# Patient Record
Sex: Male | Born: 1935
Health system: Southern US, Community
[De-identification: ages and names within clinical notes are randomized; demographics above are authoritative.]

## PROBLEM LIST (undated history)

## (undated) DIAGNOSIS — N400 Enlarged prostate without lower urinary tract symptoms: Secondary | ICD-10-CM

## (undated) DIAGNOSIS — R32 Unspecified urinary incontinence: Secondary | ICD-10-CM

## (undated) DIAGNOSIS — I82409 Acute embolism and thrombosis of unspecified deep veins of unspecified lower extremity: Secondary | ICD-10-CM

## (undated) DIAGNOSIS — F419 Anxiety disorder, unspecified: Secondary | ICD-10-CM

## (undated) DIAGNOSIS — K810 Acute cholecystitis: Secondary | ICD-10-CM

## (undated) DIAGNOSIS — I10 Essential (primary) hypertension: Secondary | ICD-10-CM

## (undated) DIAGNOSIS — M1990 Unspecified osteoarthritis, unspecified site: Secondary | ICD-10-CM

## (undated) DIAGNOSIS — K5792 Diverticulitis of intestine, part unspecified, without perforation or abscess without bleeding: Secondary | ICD-10-CM

## (undated) DIAGNOSIS — E119 Type 2 diabetes mellitus without complications: Secondary | ICD-10-CM

## (undated) DIAGNOSIS — F0390 Unspecified dementia without behavioral disturbance: Secondary | ICD-10-CM

## (undated) DIAGNOSIS — E782 Mixed hyperlipidemia: Secondary | ICD-10-CM

## (undated) DIAGNOSIS — Z8701 Personal history of pneumonia (recurrent): Secondary | ICD-10-CM

## (undated) DIAGNOSIS — K219 Gastro-esophageal reflux disease without esophagitis: Secondary | ICD-10-CM

## (undated) DIAGNOSIS — I2699 Other pulmonary embolism without acute cor pulmonale: Secondary | ICD-10-CM

## (undated) HISTORY — PX: SPINE SURGERY: SHX786

## (undated) HISTORY — DX: Essential (primary) hypertension: I10

## (undated) HISTORY — DX: Unspecified osteoarthritis, unspecified site: M19.90

## (undated) HISTORY — DX: Acute embolism and thrombosis of unspecified deep veins of unspecified lower extremity: I82.409

## (undated) HISTORY — DX: Other pulmonary embolism without acute cor pulmonale: I26.99

## (undated) HISTORY — PX: EYE SURGERY: SHX253

## (undated) HISTORY — PX: UMBILICAL HERNIA REPAIR: SHX196

## (undated) HISTORY — DX: Personal history of pneumonia (recurrent): Z87.01

## (undated) HISTORY — DX: Benign prostatic hyperplasia without lower urinary tract symptoms: N40.0

## (undated) HISTORY — DX: Unspecified dementia, unspecified severity, without behavioral disturbance, psychotic disturbance, mood disturbance, and anxiety: F03.90

## (undated) HISTORY — DX: Type 2 diabetes mellitus without complications: E11.9

## (undated) HISTORY — DX: Acute cholecystitis: K81.0

## (undated) HISTORY — PX: CARPAL TUNNEL RELEASE: SHX101

## (undated) HISTORY — DX: Mixed hyperlipidemia: E78.2

---

## 1998-10-16 ENCOUNTER — Encounter: Payer: Self-pay | Admitting: Neurosurgery

## 1998-10-21 ENCOUNTER — Inpatient Hospital Stay (HOSPITAL_COMMUNITY): Admission: RE | Admit: 1998-10-21 | Discharge: 1998-10-22 | Payer: Self-pay | Admitting: Neurosurgery

## 1998-10-21 ENCOUNTER — Encounter: Payer: Self-pay | Admitting: Neurosurgery

## 1998-12-17 ENCOUNTER — Ambulatory Visit (HOSPITAL_COMMUNITY): Admission: RE | Admit: 1998-12-17 | Discharge: 1998-12-17 | Payer: Self-pay | Admitting: Neurosurgery

## 1998-12-17 ENCOUNTER — Encounter: Payer: Self-pay | Admitting: Neurosurgery

## 2001-09-02 ENCOUNTER — Encounter: Payer: Self-pay | Admitting: *Deleted

## 2001-09-02 ENCOUNTER — Emergency Department (HOSPITAL_COMMUNITY): Admission: EM | Admit: 2001-09-02 | Discharge: 2001-09-02 | Payer: Self-pay | Admitting: *Deleted

## 2002-03-19 ENCOUNTER — Encounter: Payer: Self-pay | Admitting: Emergency Medicine

## 2002-03-19 ENCOUNTER — Emergency Department (HOSPITAL_COMMUNITY): Admission: EM | Admit: 2002-03-19 | Discharge: 2002-03-19 | Payer: Self-pay | Admitting: Emergency Medicine

## 2003-08-12 ENCOUNTER — Encounter: Payer: Self-pay | Admitting: Family Medicine

## 2003-08-12 ENCOUNTER — Ambulatory Visit (HOSPITAL_COMMUNITY): Admission: RE | Admit: 2003-08-12 | Discharge: 2003-08-12 | Payer: Self-pay | Admitting: Family Medicine

## 2003-11-15 ENCOUNTER — Emergency Department (HOSPITAL_COMMUNITY): Admission: EM | Admit: 2003-11-15 | Discharge: 2003-11-15 | Payer: Self-pay | Admitting: Emergency Medicine

## 2003-12-24 ENCOUNTER — Other Ambulatory Visit: Admission: RE | Admit: 2003-12-24 | Discharge: 2003-12-24 | Payer: Self-pay | Admitting: Urology

## 2004-01-24 ENCOUNTER — Ambulatory Visit (HOSPITAL_COMMUNITY): Admission: RE | Admit: 2004-01-24 | Discharge: 2004-01-24 | Payer: Self-pay | Admitting: Family Medicine

## 2004-01-29 ENCOUNTER — Encounter (HOSPITAL_COMMUNITY): Admission: RE | Admit: 2004-01-29 | Discharge: 2004-01-30 | Payer: Self-pay | Admitting: Internal Medicine

## 2004-02-07 ENCOUNTER — Ambulatory Visit (HOSPITAL_COMMUNITY): Admission: RE | Admit: 2004-02-07 | Discharge: 2004-02-07 | Payer: Self-pay | Admitting: Family Medicine

## 2004-02-17 ENCOUNTER — Ambulatory Visit (HOSPITAL_COMMUNITY): Admission: RE | Admit: 2004-02-17 | Discharge: 2004-02-17 | Payer: Self-pay | Admitting: Family Medicine

## 2004-11-20 ENCOUNTER — Ambulatory Visit: Payer: Self-pay | Admitting: Family Medicine

## 2005-01-21 ENCOUNTER — Ambulatory Visit: Payer: Self-pay | Admitting: Family Medicine

## 2005-03-31 ENCOUNTER — Ambulatory Visit: Payer: Self-pay | Admitting: Family Medicine

## 2005-08-17 ENCOUNTER — Ambulatory Visit: Payer: Self-pay | Admitting: Family Medicine

## 2005-10-07 ENCOUNTER — Ambulatory Visit: Payer: Self-pay | Admitting: Family Medicine

## 2005-10-25 ENCOUNTER — Ambulatory Visit: Payer: Self-pay | Admitting: Family Medicine

## 2005-10-25 ENCOUNTER — Ambulatory Visit (HOSPITAL_COMMUNITY): Admission: RE | Admit: 2005-10-25 | Discharge: 2005-10-25 | Payer: Self-pay | Admitting: Family Medicine

## 2006-02-01 ENCOUNTER — Ambulatory Visit: Payer: Self-pay | Admitting: Family Medicine

## 2006-05-04 ENCOUNTER — Ambulatory Visit: Payer: Self-pay | Admitting: Family Medicine

## 2006-08-03 ENCOUNTER — Ambulatory Visit: Payer: Self-pay | Admitting: Family Medicine

## 2006-09-05 ENCOUNTER — Ambulatory Visit: Payer: Self-pay | Admitting: Family Medicine

## 2006-11-28 ENCOUNTER — Ambulatory Visit: Payer: Self-pay | Admitting: Family Medicine

## 2007-03-02 ENCOUNTER — Encounter: Payer: Self-pay | Admitting: Family Medicine

## 2007-03-02 LAB — CONVERTED CEMR LAB
AST: 12 units/L (ref 0–37)
Alkaline Phosphatase: 58 units/L (ref 39–117)
BUN: 12 mg/dL (ref 6–23)
Bilirubin, Direct: 0.2 mg/dL (ref 0.0–0.3)
CO2: 25 meq/L (ref 19–32)
Calcium: 8.7 mg/dL (ref 8.4–10.5)
Chloride: 107 meq/L (ref 96–112)
Creatinine, Ser: 1.04 mg/dL (ref 0.40–1.50)
HDL: 43 mg/dL (ref >39)
Indirect Bilirubin: 0.7 mg/dL (ref 0.0–0.9)
LDL Cholesterol: 74 mg/dL (ref 0–99)
Total Bilirubin: 0.9 mg/dL (ref 0.3–1.2)

## 2007-03-08 ENCOUNTER — Ambulatory Visit: Payer: Self-pay | Admitting: Family Medicine

## 2007-06-29 ENCOUNTER — Ambulatory Visit: Payer: Self-pay | Admitting: Family Medicine

## 2007-07-17 ENCOUNTER — Ambulatory Visit: Payer: Self-pay | Admitting: Family Medicine

## 2007-09-07 ENCOUNTER — Ambulatory Visit: Payer: Self-pay | Admitting: Family Medicine

## 2007-11-09 ENCOUNTER — Ambulatory Visit: Payer: Self-pay | Admitting: Family Medicine

## 2007-11-09 LAB — CONVERTED CEMR LAB
Alkaline Phosphatase: 66 units/L (ref 39–117)
BUN: 8 mg/dL (ref 6–23)
CO2: 26 meq/L (ref 19–32)
Chloride: 108 meq/L (ref 96–112)
Creatinine, Ser: 0.97 mg/dL (ref 0.40–1.50)
Glucose, Bld: 101 mg/dL — ABNORMAL HIGH (ref 70–99)
Indirect Bilirubin: 0.6 mg/dL (ref 0.0–0.9)
LDL Cholesterol: 64 mg/dL (ref 0–99)
Potassium: 4.7 meq/L (ref 3.5–5.3)
Total Bilirubin: 0.8 mg/dL (ref 0.3–1.2)
Total Protein: 6.7 g/dL (ref 6.0–8.3)
Triglycerides: 94 mg/dL (ref <150)
VLDL: 19 mg/dL (ref 0–40)

## 2007-12-18 ENCOUNTER — Ambulatory Visit: Payer: Self-pay | Admitting: Family Medicine

## 2008-03-19 ENCOUNTER — Ambulatory Visit: Payer: Self-pay | Admitting: Family Medicine

## 2008-03-28 DIAGNOSIS — N400 Enlarged prostate without lower urinary tract symptoms: Secondary | ICD-10-CM | POA: Insufficient documentation

## 2008-03-28 DIAGNOSIS — M129 Arthropathy, unspecified: Secondary | ICD-10-CM | POA: Insufficient documentation

## 2008-04-09 ENCOUNTER — Ambulatory Visit (HOSPITAL_COMMUNITY): Admission: RE | Admit: 2008-04-09 | Discharge: 2008-04-09 | Payer: Self-pay | Admitting: Family Medicine

## 2008-05-07 ENCOUNTER — Ambulatory Visit: Payer: Self-pay | Admitting: Family Medicine

## 2008-05-21 ENCOUNTER — Encounter: Payer: Self-pay | Admitting: Family Medicine

## 2008-05-21 LAB — CONVERTED CEMR LAB
AST: 10 units/L (ref 0–37)
Albumin: 3.9 g/dL (ref 3.5–5.2)
Alkaline Phosphatase: 45 units/L (ref 39–117)
HDL: 52 mg/dL (ref >39)
LDL Cholesterol: 60 mg/dL (ref 0–99)
Total CHOL/HDL Ratio: 2.5
Total Protein: 6.8 g/dL (ref 6.0–8.3)
Triglycerides: 94 mg/dL (ref <150)

## 2008-07-02 ENCOUNTER — Telehealth (INDEPENDENT_AMBULATORY_CARE_PROVIDER_SITE_OTHER): Payer: Self-pay | Admitting: *Deleted

## 2008-07-18 ENCOUNTER — Ambulatory Visit: Payer: Self-pay | Admitting: Family Medicine

## 2008-07-18 LAB — CONVERTED CEMR LAB: Blood Glucose, Fasting: 147 mg/dL

## 2008-07-19 ENCOUNTER — Encounter: Payer: Self-pay | Admitting: Family Medicine

## 2008-07-19 DIAGNOSIS — E785 Hyperlipidemia, unspecified: Secondary | ICD-10-CM | POA: Insufficient documentation

## 2008-07-19 LAB — CONVERTED CEMR LAB: Microalb, Ur: 0.2 mg/dL (ref 0.00–1.89)

## 2008-08-28 ENCOUNTER — Telehealth: Payer: Self-pay | Admitting: Family Medicine

## 2008-09-02 ENCOUNTER — Ambulatory Visit: Payer: Self-pay | Admitting: Family Medicine

## 2008-09-02 LAB — CONVERTED CEMR LAB
ALT: 9 units/L (ref 0–53)
AST: 12 units/L (ref 0–37)
Alkaline Phosphatase: 48 units/L (ref 39–117)
BUN: 14 mg/dL (ref 6–23)
Basophils Absolute: 0 10*3/uL (ref 0.0–0.1)
Basophils Relative: 1 % (ref 0–1)
Calcium: 9.4 mg/dL (ref 8.4–10.5)
Cholesterol: 143 mg/dL (ref 0–200)
Creatinine, Ser: 1.11 mg/dL (ref 0.40–1.50)
Eosinophils Absolute: 0.1 10*3/uL (ref 0.0–0.7)
Hemoglobin: 14.1 g/dL (ref 13.0–17.0)
Indirect Bilirubin: 1.4 mg/dL — ABNORMAL HIGH (ref 0.0–0.9)
MCHC: 33.7 g/dL (ref 30.0–36.0)
MCV: 92.5 fL (ref 78.0–100.0)
Monocytes Absolute: 0.5 10*3/uL (ref 0.1–1.0)
Monocytes Relative: 11 % (ref 3–12)
RBC: 4.52 M/uL (ref 4.22–5.81)
RDW: 13.3 % (ref 11.5–15.5)
Total Protein: 7.3 g/dL (ref 6.0–8.3)
Triglycerides: 89 mg/dL (ref <150)

## 2008-09-04 ENCOUNTER — Ambulatory Visit: Payer: Self-pay | Admitting: Family Medicine

## 2008-09-05 DIAGNOSIS — E1121 Type 2 diabetes mellitus with diabetic nephropathy: Secondary | ICD-10-CM

## 2008-09-05 DIAGNOSIS — Z794 Long term (current) use of insulin: Secondary | ICD-10-CM

## 2008-09-05 DIAGNOSIS — E1165 Type 2 diabetes mellitus with hyperglycemia: Secondary | ICD-10-CM

## 2008-09-18 ENCOUNTER — Telehealth: Payer: Self-pay | Admitting: Family Medicine

## 2008-10-31 ENCOUNTER — Encounter: Payer: Self-pay | Admitting: Family Medicine

## 2008-12-10 ENCOUNTER — Ambulatory Visit: Payer: Self-pay | Admitting: Family Medicine

## 2008-12-10 LAB — CONVERTED CEMR LAB: Glucose, Bld: 157 mg/dL

## 2008-12-11 DIAGNOSIS — I10 Essential (primary) hypertension: Secondary | ICD-10-CM

## 2009-01-13 ENCOUNTER — Encounter: Payer: Self-pay | Admitting: Family Medicine

## 2009-03-20 ENCOUNTER — Ambulatory Visit: Payer: Self-pay | Admitting: Family Medicine

## 2009-03-20 ENCOUNTER — Telehealth: Payer: Self-pay | Admitting: Family Medicine

## 2009-03-21 ENCOUNTER — Telehealth: Payer: Self-pay | Admitting: Family Medicine

## 2009-03-24 ENCOUNTER — Encounter: Payer: Self-pay | Admitting: Family Medicine

## 2009-03-24 LAB — CONVERTED CEMR LAB
ALT: 9 units/L (ref 0–53)
Bilirubin, Direct: 0.3 mg/dL (ref 0.0–0.3)
Chloride: 104 meq/L (ref 96–112)
Cholesterol: 124 mg/dL (ref 0–200)
Glucose, Bld: 155 mg/dL — ABNORMAL HIGH (ref 70–99)
PSA: 8.67 ng/mL — ABNORMAL HIGH (ref 0.10–4.00)
Potassium: 4.3 meq/L (ref 3.5–5.3)
Sodium: 141 meq/L (ref 135–145)
TSH: 0.513 microintl units/mL (ref 0.350–4.500)
Total CHOL/HDL Ratio: 2.8
Total Protein: 6.7 g/dL (ref 6.0–8.3)
VLDL: 20 mg/dL (ref 0–40)

## 2009-03-27 ENCOUNTER — Telehealth: Payer: Self-pay | Admitting: Family Medicine

## 2009-04-29 ENCOUNTER — Encounter: Payer: Self-pay | Admitting: Family Medicine

## 2009-05-12 ENCOUNTER — Encounter: Payer: Self-pay | Admitting: Family Medicine

## 2009-05-23 ENCOUNTER — Encounter: Payer: Self-pay | Admitting: Family Medicine

## 2009-06-19 ENCOUNTER — Ambulatory Visit: Payer: Self-pay | Admitting: Family Medicine

## 2009-06-19 DIAGNOSIS — E049 Nontoxic goiter, unspecified: Secondary | ICD-10-CM

## 2009-07-24 ENCOUNTER — Telehealth: Payer: Self-pay | Admitting: Family Medicine

## 2009-07-25 ENCOUNTER — Encounter: Payer: Self-pay | Admitting: Family Medicine

## 2009-07-31 ENCOUNTER — Encounter: Payer: Self-pay | Admitting: Family Medicine

## 2009-07-31 LAB — CONVERTED CEMR LAB
AST: 14 units/L (ref 0–37)
Alkaline Phosphatase: 44 units/L (ref 39–117)
Basophils Relative: 0 % (ref 0–1)
Bilirubin, Direct: 0.2 mg/dL (ref 0.0–0.3)
CO2: 26 meq/L (ref 19–32)
Calcium: 8.8 mg/dL (ref 8.4–10.5)
Creatinine, Ser: 1.26 mg/dL (ref 0.40–1.50)
Eosinophils Absolute: 0.1 10*3/uL (ref 0.0–0.7)
Eosinophils Relative: 3 % (ref 0–5)
Glucose, Bld: 99 mg/dL (ref 70–99)
Hemoglobin: 13.3 g/dL (ref 13.0–17.0)
MCHC: 32.6 g/dL (ref 30.0–36.0)
MCV: 93.6 fL (ref 78.0–100.0)
Microalb, Ur: 0.5 mg/dL (ref 0.00–1.89)
Monocytes Absolute: 0.5 10*3/uL (ref 0.1–1.0)
Monocytes Relative: 12 % (ref 3–12)
RBC: 4.36 M/uL (ref 4.22–5.81)
Total Bilirubin: 1 mg/dL (ref 0.3–1.2)

## 2009-09-04 ENCOUNTER — Ambulatory Visit: Payer: Self-pay | Admitting: Family Medicine

## 2009-09-04 LAB — CONVERTED CEMR LAB: Hgb A1c MFr Bld: 7.3 %

## 2009-09-16 ENCOUNTER — Telehealth: Payer: Self-pay | Admitting: Family Medicine

## 2009-11-27 ENCOUNTER — Encounter: Payer: Self-pay | Admitting: Family Medicine

## 2009-12-29 ENCOUNTER — Telehealth: Payer: Self-pay | Admitting: Physician Assistant

## 2010-01-08 ENCOUNTER — Ambulatory Visit: Payer: Self-pay | Admitting: Family Medicine

## 2010-01-08 LAB — CONVERTED CEMR LAB
Blood Glucose, Fasting: 276 mg/dL
Hgb A1c MFr Bld: 7.4 %

## 2010-01-12 ENCOUNTER — Telehealth: Payer: Self-pay | Admitting: Family Medicine

## 2010-01-13 LAB — CONVERTED CEMR LAB
ALT: 8 units/L (ref 0–53)
AST: 15 units/L (ref 0–37)
Albumin: 4.1 g/dL (ref 3.5–5.2)
CO2: 27 meq/L (ref 19–32)
Calcium: 9.5 mg/dL (ref 8.4–10.5)
Chloride: 101 meq/L (ref 96–112)
HDL: 44 mg/dL (ref >39)
Sodium: 140 meq/L (ref 135–145)
Total Bilirubin: 1 mg/dL (ref 0.3–1.2)
Total CHOL/HDL Ratio: 2.9
Total Protein: 7.1 g/dL (ref 6.0–8.3)
Triglycerides: 72 mg/dL (ref <150)

## 2010-02-09 ENCOUNTER — Telehealth: Payer: Self-pay | Admitting: Family Medicine

## 2010-02-09 ENCOUNTER — Ambulatory Visit: Payer: Self-pay | Admitting: Family Medicine

## 2010-02-20 ENCOUNTER — Telehealth: Payer: Self-pay | Admitting: Family Medicine

## 2010-03-05 ENCOUNTER — Telehealth: Payer: Self-pay | Admitting: Family Medicine

## 2010-04-14 ENCOUNTER — Telehealth: Payer: Self-pay | Admitting: Family Medicine

## 2010-05-22 ENCOUNTER — Telehealth: Payer: Self-pay | Admitting: Family Medicine

## 2010-06-05 ENCOUNTER — Ambulatory Visit: Payer: Self-pay | Admitting: Family Medicine

## 2010-06-07 DIAGNOSIS — L0390 Cellulitis, unspecified: Secondary | ICD-10-CM

## 2010-06-07 DIAGNOSIS — L0291 Cutaneous abscess, unspecified: Secondary | ICD-10-CM | POA: Insufficient documentation

## 2010-06-07 LAB — CONVERTED CEMR LAB: Hgb A1c MFr Bld: 7.4 % — ABNORMAL HIGH (ref <5.7)

## 2010-06-15 ENCOUNTER — Telehealth: Payer: Self-pay | Admitting: Family Medicine

## 2010-06-16 ENCOUNTER — Encounter: Payer: Self-pay | Admitting: Family Medicine

## 2010-07-21 ENCOUNTER — Telehealth: Payer: Self-pay | Admitting: Family Medicine

## 2010-07-27 ENCOUNTER — Ambulatory Visit: Payer: Self-pay | Admitting: Family Medicine

## 2010-07-27 ENCOUNTER — Ambulatory Visit (HOSPITAL_COMMUNITY): Admission: RE | Admit: 2010-07-27 | Discharge: 2010-07-27 | Payer: Self-pay | Admitting: Family Medicine

## 2010-07-27 DIAGNOSIS — R609 Edema, unspecified: Secondary | ICD-10-CM

## 2010-07-28 ENCOUNTER — Encounter: Payer: Self-pay | Admitting: Physician Assistant

## 2010-07-28 LAB — CONVERTED CEMR LAB
Hemoglobin: 12.8 g/dL — ABNORMAL LOW (ref 13.0–17.0)
MCHC: 32.9 g/dL (ref 30.0–36.0)
Platelets: 222 10*3/uL (ref 150–400)
RDW: 13.7 % (ref 11.5–15.5)
Uric Acid, Serum: 7.3 mg/dL (ref 4.0–7.8)

## 2010-07-30 LAB — CONVERTED CEMR LAB
Ferritin: 84 ng/mL (ref 22–322)
Iron: 95 ug/dL (ref 42–165)

## 2010-08-04 ENCOUNTER — Ambulatory Visit: Payer: Self-pay | Admitting: Family Medicine

## 2010-08-07 ENCOUNTER — Encounter: Payer: Self-pay | Admitting: Physician Assistant

## 2010-08-12 ENCOUNTER — Telehealth: Payer: Self-pay | Admitting: Family Medicine

## 2010-08-13 ENCOUNTER — Encounter: Payer: Self-pay | Admitting: Family Medicine

## 2010-08-14 ENCOUNTER — Ambulatory Visit (HOSPITAL_COMMUNITY): Admission: RE | Admit: 2010-08-14 | Discharge: 2010-08-14 | Payer: Self-pay | Admitting: Family Medicine

## 2010-09-08 ENCOUNTER — Ambulatory Visit: Payer: Self-pay | Admitting: Family Medicine

## 2010-09-09 LAB — CONVERTED CEMR LAB
BUN: 14 mg/dL (ref 6–23)
Calcium: 9.4 mg/dL (ref 8.4–10.5)
Creatinine, Ser: 1.17 mg/dL (ref 0.40–1.50)
Hgb A1c MFr Bld: 7.1 % — ABNORMAL HIGH (ref <5.7)

## 2010-09-12 DIAGNOSIS — F0280 Dementia in other diseases classified elsewhere without behavioral disturbance: Secondary | ICD-10-CM | POA: Insufficient documentation

## 2010-09-12 DIAGNOSIS — G309 Alzheimer's disease, unspecified: Secondary | ICD-10-CM

## 2010-09-22 ENCOUNTER — Telehealth: Payer: Self-pay | Admitting: Family Medicine

## 2010-09-24 ENCOUNTER — Telehealth: Payer: Self-pay | Admitting: Family Medicine

## 2010-10-05 ENCOUNTER — Telehealth: Payer: Self-pay | Admitting: Family Medicine

## 2010-10-09 ENCOUNTER — Telehealth (INDEPENDENT_AMBULATORY_CARE_PROVIDER_SITE_OTHER): Payer: Self-pay | Admitting: *Deleted

## 2010-10-12 ENCOUNTER — Encounter: Payer: Self-pay | Admitting: Family Medicine

## 2010-11-17 ENCOUNTER — Telehealth: Payer: Self-pay | Admitting: Family Medicine

## 2010-12-02 ENCOUNTER — Encounter: Payer: Self-pay | Admitting: Family Medicine

## 2010-12-10 ENCOUNTER — Encounter: Payer: Self-pay | Admitting: Family Medicine

## 2010-12-11 ENCOUNTER — Encounter: Payer: Self-pay | Admitting: Family Medicine

## 2010-12-11 ENCOUNTER — Ambulatory Visit
Admission: RE | Admit: 2010-12-11 | Discharge: 2010-12-11 | Payer: Self-pay | Source: Home / Self Care | Attending: Family Medicine | Admitting: Family Medicine

## 2010-12-14 ENCOUNTER — Encounter: Payer: Self-pay | Admitting: Family Medicine

## 2010-12-14 ENCOUNTER — Ambulatory Visit
Admission: RE | Admit: 2010-12-14 | Discharge: 2010-12-14 | Payer: Self-pay | Source: Home / Self Care | Attending: Family Medicine | Admitting: Family Medicine

## 2010-12-14 LAB — CONVERTED CEMR LAB
AST: 13 units/L (ref 0–37)
Albumin: 4.1 g/dL (ref 3.5–5.2)
Alkaline Phosphatase: 54 units/L (ref 39–117)
Basophils Absolute: 0 10*3/uL (ref 0.0–0.1)
Basophils Relative: 0 % (ref 0–1)
Eosinophils Absolute: 0.2 10*3/uL (ref 0.0–0.7)
HDL: 42 mg/dL (ref >39)
LDL Cholesterol: 76 mg/dL (ref 0–99)
MCHC: 33.7 g/dL (ref 30.0–36.0)
MCV: 89.4 fL (ref 78.0–100.0)
Monocytes Absolute: 0.5 10*3/uL (ref 0.1–1.0)
Monocytes Relative: 10 % (ref 3–12)
Neutro Abs: 2.2 10*3/uL (ref 1.7–7.7)
Neutrophils Relative %: 49 % (ref 43–77)
Potassium: 4.2 meq/L (ref 3.5–5.3)
RBC: 4.35 M/uL (ref 4.22–5.81)
RDW: 13.8 % (ref 11.5–15.5)
Sodium: 140 meq/L (ref 135–145)
TSH: 0.632 microintl units/mL (ref 0.350–4.500)
Total Bilirubin: 1 mg/dL (ref 0.3–1.2)
Total Protein: 7.6 g/dL (ref 6.0–8.3)
Triglycerides: 58 mg/dL (ref <150)

## 2010-12-16 ENCOUNTER — Telehealth: Payer: Self-pay | Admitting: Family Medicine

## 2010-12-16 ENCOUNTER — Encounter: Payer: Self-pay | Admitting: Family Medicine

## 2010-12-19 ENCOUNTER — Encounter: Payer: Self-pay | Admitting: Family Medicine

## 2010-12-20 ENCOUNTER — Encounter: Payer: Self-pay | Admitting: Family Medicine

## 2010-12-22 ENCOUNTER — Encounter: Payer: Self-pay | Admitting: Family Medicine

## 2010-12-29 ENCOUNTER — Encounter: Payer: Self-pay | Admitting: Family Medicine

## 2010-12-29 NOTE — Assessment & Plan Note (Signed)
Summary: follow up   Vital Signs:  Patient profile:   77 year old male Height:      70 inches Weight:      248.25 pounds O2 Sat:      94 % on Room air Pulse rate:   83 / minute Pulse rhythm:   regular Resp:     16 per minute BP sitting:   120 / 80  (left arm)  Vitals Entered By: Adella Hare LPN (June 05, 1190 9:47 AM)  O2 Flow:  Room air CC: follow-up visit Is Patient Diabetic? Yes Did you bring your meter with you today? No Pain Assessment Patient in pain? no      Comments still has rash on legs   Primary Care Provider:  Syliva Overman MD  CC:  follow-up visit.  History of Present Illness: Reports  that he has been doing fairly well. he does however have valid concerns about a rash on his lower ext which has progressed in the past 2 onths, it is puritic, there has been no change in tolietries or foods or meds. Denies recent fever or chills. Denies sinus pressure, nasal congestion , ear pain or sore throat. Denies chest congestion, or cough productive of sputum. Denies chest pain, palpitations, PND, orthopnea or leg swelling. Denies abdominal pain, nausea, vomitting, diarrhea or constipation. Denies change in bowel movements or bloody stool. Denies dysuria , frequency, incontinence or hesitancy. He c/o increased right knee pain and stiffness for the the past 1 month, there has been no aggravating trauma. Denies headaches, vertigo, seizures. Denies depression, anxiety or insomnia.    Current Medications (verified): 1)  Metformin Hcl 500 Mg  Tabs (Metformin Hcl) .... Take Two Tablets By Mouth Twice Daily 2)  Aspirin 81 Mg  Tabs (Aspirin) .... Take 1 Tablet By Mouth Once A Day 3)  Simvastatin 40 Mg  Tabs (Simvastatin) .... Take 1 Tab By Mouth At Bedtime 4)  Norvasc 10 Mg  Tabs (Amlodipine Besylate) .... Take 1 Tablet By Mouth Once A Day 5)  Hydrochlorothiazide 12.5 Mg  Tabs (Hydrochlorothiazide) .... Take 1 Tablet By Mouth Once A Day 6)  Aleve 220 Mg Caps  (Naproxen Sodium) .... Take One Tab Every Six Hours As Needed 7)  Lancets  Misc (Lancets) .... Once Daily Testing 8)  Glipizide 10 Mg Tabs (Glipizide) .... One Tab By Mouth Bid 9)  Prodigy Autocode Blood Glucose W/device Kit (Blood Glucose Monitoring Suppl) .... Uad Once Daily Testing 10)  Prodigy Blood Glucose Test  Strp (Glucose Blood) .... Uad Once Daily Testing 11)  Prodigy Twist Top Lancets 28g  Misc (Lancets) .... Uad Once Daily Testing 12)  Onglyza 5 Mg Tabs (Saxagliptin Hcl) .... One Tab By Mouth Qd 13)  Doxycycline Hyclate 100 Mg Caps (Doxycycline Hyclate) .... Take 1 Capsule By Mouth Two Times A Day 14)  Nystatin-Triamcinolone 100000-0.1 Unit/gm-% Crea (Nystatin-Triamcinolone) .... Apply To Rash Bid  Allergies (verified): No Known Drug Allergies  Review of Systems      See HPI Eyes:  Complains of vision loss-both eyes; denies discharge, eye pain, and red eye. MS:  Complains of joint pain and stiffness; right knee pain with instability worse in the past 1 month. Derm:  Complains of itching, lesion(s), and rash; 2 month h/o red rash to both lower legs also pustules on right sole on right sole. Endo:  Denies excessive thirst and excessive urination; when checked, blood sugars are seldom over 130. Heme:  Denies abnormal bruising and bleeding. Allergy:  Complains of seasonal allergies.  Physical Exam  General:  Well-developed,obese,in no acute distress; alert,appropriate and cooperative throughout examination HEENT: No facial asymmetry,  EOMI, No sinus tenderness, TM's Clear, oropharynx  pink and moist.   Chest: Clear to auscultation bilaterally.  CVS: S1, S2, No murmurs, No S3.   Abd: Soft, Nontender.  MS: decreased  ROM spine, hips, shoulders and most markedly right  knee.  Ext: No edema.   CNS: CN 2-12 intact, power tone and sensation normal throughout.   Skin: I erythematous macular rash on lower ext bilaterally, primarilly the distal third. scabbed areas on sole of right  grt toe, where pt reports there had been pus draining Psych: Good eye contact, normal affect.  Memory intact, not anxious or depressed appearing.   Diabetes Management Exam:    Foot Exam (with socks and/or shoes not present):       Sensory-Monofilament:          Left foot: diminished          Right foot: diminished       Inspection:          Left foot: abnormal             Comments: pustule on right great toe          Right foot: normal       Nails:          Left foot: normal          Right foot: normal   Impression & Recommendations:  Problem # 1:  DERMATITIS (ICD-692.9) Assessment Deteriorated  His updated medication list for this problem includes:    Prednisone (pak) 5 Mg Tabs (Prednisone) ..... Use as directed    Betamethasone Dipropionate 0.05 % Crea (Betamethasone dipropionate) .[name]... Apply twice daily to affectedareas for 2 weeks then as needed  Problem # 2:  KNEE PAIN, BILATERAL (ICD-719.46) Assessment: Deteriorated  His updated medication list for this problem includes:    Aspirin 81 Mg Tabs (Aspirin) .[name]... Take 1 tablet by mouth once a day    Aleve 220 Mg Caps (Naproxen sodium) .[name]... Take one tab every six hours as needed  Orders: Depo- Medrol 80mg  (J1040) Ketorolac-Toradol 15mg  (Z6109) Admin of Therapeutic Inj  intramuscular or subcutaneous (60454)  Problem # 3:  HYPERTENSION (ICD-401.9) Assessment: Improved  His updated medication list for this problem includes:    Norvasc 10 Mg Tabs (Amlodipine besylate) .[name]... Take 1 tablet by mouth once a day    Hydrochlorothiazide 12.5 Mg Tabs (Hydrochlorothiazide) .[name]... Take 1 tablet by mouth once a day  BP today: 120/80 Prior BP: 130/80 (02/09/2010)  Labs Reviewed: K+: 4.5 (01/13/2010) Creat: : 1.20 (01/13/2010)   Chol: 128 (01/13/2010)   HDL: 44 (01/13/2010)   LDL: 70 (01/13/2010)   TG: 72 (01/13/2010)  Problem # 4:  DM (ICD-250.00) Assessment: Comment Only  His updated medication list for this problem  includes:    Metformin Hcl 500 Mg Tabs (Metformin hcl) .[name]... Take two tablets by mouth twice daily    Aspirin 81 Mg Tabs (Aspirin) .[name]... Take 1 tablet by mouth once a day    Glipizide 10 Mg Tabs (Glipizide) ..... One tab by mouth bid    Onglyza 5 Mg Tabs (Saxagliptin hcl) ..... One tab by mouth qd  Labs Reviewed: Creat: 1.20 (01/13/2010)    Reviewed HgBA1c results: 7.4 (01/08/2010)  7.3 (09/04/2009)  Problem # 5:  OBESITY (ICD-278.00) Assessment: Improved  Ht: 70 (06/05/2010)   Wt: 248.25 (06/05/2010)  BMI: 35.82 (02/09/2010)  Problem # 6:  CELLULITIS AND ABSCESS OF UNSPECIFIED SITE (ICD-682.9) Assessment: Deteriorated  The following medications were removed from the medication list:    Doxycycline Hyclate 100 Mg Caps (Doxycycline hyclate) .[name]... Take 1 capsule by mouth two times a day His updated medication list for this problem includes:    Septra Ds 800-160 Mg Tabs (Sulfamethoxazole-trimethoprim) .[name]... Take 1 tablet by mouth two times a day  Complete Medication List: 1)  Metformin Hcl 500 Mg Tabs (Metformin hcl) .... Take two tablets by mouth twice daily 2)  Aspirin 81 Mg Tabs (Aspirin) .... Take 1 tablet by mouth once a day 3)  Simvastatin 40 Mg Tabs (Simvastatin) .... Take 1 tab by mouth at bedtime 4)  Norvasc 10 Mg Tabs (Amlodipine besylate) .... Take 1 tablet by mouth once a day 5)  Hydrochlorothiazide 12.5 Mg Tabs (Hydrochlorothiazide) .... Take 1 tablet by mouth once a day 6)  Aleve 220 Mg Caps (Naproxen sodium) .... Take one tab every six hours as needed 7)  Lancets Misc (Lancets) .... Once daily testing 8)  Glipizide 10 Mg Tabs (Glipizide) .... One tab by mouth bid 9)  Prodigy Autocode Blood Glucose W/device Kit (Blood glucose monitoring suppl) .... Uad once daily testing 10)  Prodigy Blood Glucose Test Strp (Glucose blood) .... Uad once daily testing 11)  Prodigy Twist Top Lancets 28g Misc (Lancets) .... Uad once daily testing 12)  Onglyza 5 Mg Tabs (Saxagliptin hcl)  .... One tab by mouth qd 13)  Prednisone (pak) 5 Mg Tabs (Prednisone) .... Use as directed 14)  Betamethasone Dipropionate 0.05 % Crea (Betamethasone dipropionate) .... Apply twice daily to affectedareas for 2 weeks then as needed 15)  Septra Ds 800-160 Mg Tabs (Sulfamethoxazole-trimethoprim) .... Take 1 tablet by mouth two times a day  Patient Instructions: 1)  Please schedule a follow-up appointment in 3 months. 2)  you are getting injections today for your knee pain, also for the rash on your legs, if this does not get better in the next 10 to 14 dayspls call, because i will refer you to a skin specialist. 3)  Medication is also sent to you r pharmacy  4)  Your blood pressure and cholesterol are great, and meds are sent in to the pharmacy. Prescriptions: SEPTRA DS 800-160 MG TABS (SULFAMETHOXAZOLE-TRIMETHOPRIM) Take 1 tablet by mouth two times a day  #14 x 0   Entered and Authorized by:   Syliva Overman MD   Signed by:   Syliva Overman MD on 06/05/2010   Method used:   Electronically to        [facility]* (retail)       [address]       [place], [place]  [address]       Ph: [phone]       Fax: [phone]   RxID:   [id number] BETAMETHASONE DIPROPIONATE 0.05 % CREA (BETAMETHASONE DIPROPIONATE) apply twice daily to affectedareas for 2 weeks then as needed  #45 gm x 1   Entered and Authorized by:   Syliva Overman MD   Signed by:   Syliva Overman MD on 06/05/2010   Method used:   Electronically to        [facility]* (retail)       [address]       [place], [place]  [address]  Ph: [phone]       Fax: [phone]   RxID:   [id number] PREDNISONE (PAK) 5 MG TABS (PREDNISONE) Use as directed  #21 x 0   Entered and Authorized by:   Syliva Overman MD   Signed by:   Syliva Overman MD on 06/05/2010   Method used:   Electronically to        [facility]* (retail)       [address]       [place], [place]  [address]       Ph: [phone]       Fax: [phone]   RxID:   [id number]    Medication Administration  Injection # 1:    Medication: Depo- Medrol 80mg     Diagnosis: KNEE PAIN, BILATERAL (ICD-719.46)    Route: IM    Site: RUOQ gluteus    Exp Date: 4/12    Lot #: [id number]    Mfr: Pharmacia    Patient tolerated injection without complications    Given by: Adella Hare LPN (June 05, 3294 11:28 AM)  Injection # 2:    Medication: Ketorolac-Toradol 15mg     Diagnosis: KNEE PAIN, BILATERAL (ICD-719.46)    Route: IM    Site: RUOQ gluteus    Exp Date: 10/30/2011    Lot #: [id number]    Mfr: hospira    Comments: toradol 60mg  given    Patient tolerated injection without complications    Given by: Adella Hare LPN (June 05, 629 11:28 AM)  Orders Added: 1)  Est. Patient Level IV [16010] 2)  Depo- Medrol 80mg  [J1040] 3)  Ketorolac-Toradol 15mg  [J1885] 4)  Admin of Therapeutic Inj  intramuscular or subcutaneous [93235]

## 2010-12-29 NOTE — Miscellaneous (Signed)
Summary: gi referal  has appt at [facility] sept 13 11:30 w/Jones

## 2010-12-29 NOTE — Progress Notes (Signed)
  Phone Note Other Incoming   Caller: [facility] Summary of Call: pt requestingy pt eval for lift chair  and therapy dx osteoarthritis lk nee and dJD spine with reduced mobility, will refer Initial call taken by: Syliva Overman MD,  October 09, 2010 6:40 AM  Follow-up for Phone Call        pls refer for pT eval for lift chair and value of pT for severe arthritis with uncontrolled pain and instability, seereferral box also Follow-up by: Syliva Overman MD,  October 09, 2010 6:42 AM  Additional Follow-up for Phone Call Additional follow up Details #1::        patient is aware that I have faxed over PT request. Additional Follow-up by: Curtis Sites,  October 09, 2010 10:35 AM

## 2010-12-29 NOTE — Letter (Signed)
Summary: med review sheet  med review sheet   Imported By: Rudene Anda 09/08/2010 09:48:02  _____________________________________________________________________  External Attachment:    Type:   Image     Comment:   External Document

## 2010-12-29 NOTE — Assessment & Plan Note (Signed)
Summary: f up   Vital Signs:  Patient profile:   77 year old male Height:      70 inches Weight:      245.25 pounds BMI:     35.32 O2 Sat:      95 % on Room air Pulse rate:   87 / minute Pulse rhythm:   regular Resp:     16 per minute BP sitting:   130 / 70  (left arm)  Vitals Entered By: Adella Hare LPN (September 08, 2010 8:26 AM)  Nutrition Counseling: Patient's BMI is greater than 25 and therefore counseled on weight management options.  O2 Flow:  Room air CC: follow-up visit Is Patient Diabetic? Yes Did you bring your meter with you today? No   Primary Care [place]:  Syliva Overman MD  CC:  follow-up visit.  History of Present Illness: Reports  that he has been doing fairly well. He has tolerated the aricept withno complications, esp gI distress. Both he and hiswife are interested in referral to [facility], he accepts that he does have memory loss, and they may benefit from help Denies recent fever or chills. Denies sinus pressure, nasal congestion , ear pain or sore throat. Denies chest congestion, or cough productive of sputum. Denies chest pain, palpitations, PND, orthopnea or leg swelling. Denies abdominal pain, nausea, vomitting, diarrhea or constipation. Denies change in bowel movements or bloody stool. Denies dysuria , frequency, incontinence or hesitancy. He has chronic back and knee pain whuich is essentially unchanged.  Denies headaches, vertigo, seizures. Denies depression, anxiety or insomnia.He reports a very unpleasant and terrifying experience when he was having his brain scan, and states this is the last he will have. Denies  rash, lesions, or itch.     Allergies (verified): No Known Drug Allergies  Review of Systems      See HPI General:  Complains of fatigue. Eyes:  Complains of vision loss-both eyes; denies blurring and discharge. MS:  Complains of joint pain and stiffness; bilateral knee pain and stiffness with instability, has to use  a walker, not willing to get ortho eval at this time. Neuro:  Complains of memory loss, poor balance, and weakness. Endo:  Denies excessive thirst and excessive urination; tests. Heme:  Denies abnormal bruising and bleeding. Allergy:  Denies seasonal allergies.  Physical Exam  General:  Well-developed,well-nourished,in no acute distress; alert,appropriate and cooperative throughout examination. pt ambulating with walker at this visit, denies falls, but reports incinstability. HEENT: No facial asymmetry,  EOMI, No sinus tenderness, TM's Clear, oropharynx  pink and moist. Goiter  Chest: Clear to auscultation bilaterally.  CVS: S1, S2, No murmurs, No S3.   Abd: Soft, Nontender.  MS: decreased ROM spine, hips, shoulders and knees.  Ext: No edema.   CNS: CN 2-12 intact, power tone and sensation normal throughout.   Skin: Intact, no visible lesions or rashes.  Psych: Good eye contact, normal affect.  Memory intact, not anxious or depressed appearing.   Diabetes Management Exam:    Foot Exam (with socks and/or shoes not present):       Sensory-Monofilament:          Left foot: diminished          Right foot: diminished       Inspection:          Left foot: normal          Right foot: normal       Nails:  Left foot: normal          Right foot: normal   Impression & Recommendations:  Problem # 1:  DEMENTIA (ICD-294.8) Assessment Comment Only global atrophy reported on recent brain scan, dose of aricept inc to std treating dose  Problem # 2:  HYPERLIPIDEMIA (ICD-272.4) Assessment: Comment Only  His updated medication list for this problem includes:    Lovastatin 40 Mg Tabs (Lovastatin) .[name]... Take 1 tab by mouth at bedtime Low fat dietdiscussed and encouraged fasting labs past due, pt aware Labs Reviewed: SGOT: 15 (01/13/2010)   SGPT: 8 (01/13/2010)   HDL:44 (01/13/2010), 48 (07/31/2009)  LDL:70 (01/13/2010), 62 (07/31/2009)  Chol:128 (01/13/2010), 126 (07/31/2009)   Trig:72 (01/13/2010), 81 (07/31/2009)  Problem # 3:  HYPERTENSION (ICD-401.9) Assessment: Unchanged  His updated medication list for this problem includes:    Norvasc 10 Mg Tabs (Amlodipine besylate) .[name]... Take 1 tablet by mouth once a day    Hydrochlorothiazide 12.5 Mg Tabs (Hydrochlorothiazide) .[name]... Take 1 tablet by mouth once a day  Orders: T-Basic Metabolic Panel [id number])  BP today: 130/70 Prior BP: 112/70 (08/04/2010)  Labs Reviewed: K+: 4.5 (01/13/2010) Creat: : 1.20 (01/13/2010)   Chol: 128 (01/13/2010)   HDL: 44 (01/13/2010)   LDL: 70 (01/13/2010)   TG: 72 (01/13/2010)  Problem # 4:  DM (ICD-250.00) Assessment: Comment Only  The following medications were removed from the medication list:    Glipizide 10 Mg Tabs (Glipizide) ..... One tab by mouth bid    Onglyza 5 Mg Tabs (Saxagliptin hcl) ..... One tab by mouth qd His updated medication list for this problem includes:    Metformin Hcl 500 Mg Tabs (Metformin hcl) .[name]... Take two tablets by mouth twice daily    Aspirin 81 Mg Tabs (Aspirin) .[name]... Take 1 tablet by mouth once a day    Glipizide 10 Mg Tabs (Glipizide) .[name]... Take 1 tablet by mouth two times a day    Onglyza 5 Mg Tabs (Saxagliptin hcl) .[name]... Take 1 tablet by mouth once a day Patient advised to reduce carbs and sweets, commit to regular physical activity, take meds as prescribed, test blood sugars as directed, and attempt to lose weight , to improve blood sugar control.  Orders: T- Hemoglobin A1C ([id number])  Labs Reviewed: Creat: 1.20 (01/13/2010)    Reviewed HgBA1c results: 7.4 (06/05/2010)  7.4 (01/08/2010)  Problem # 5:  KNEE PAIN, BILATERAL (ICD-719.46) Assessment: Deteriorated  His updated medication list for this problem includes:    Aspirin 81 Mg Tabs (Aspirin) .[name]... Take 1 tablet by mouth once a day    Aleve 220 Mg Caps (Naproxen sodium) .[name]... Take one tab every six hours as needed encouraged to seek ortho eval since he is more dependent  on assistive device  Problem # 6:  GOITER (ICD-240.9) Assessment: Comment Only recent scasn identified no suspiscious ares which is reassuring  Complete Medication List: 1)  Metformin Hcl 500 Mg Tabs (Metformin hcl) .... Take two tablets by mouth twice daily 2)  Aspirin 81 Mg Tabs (Aspirin) .... Take 1 tablet by mouth once a day 3)  Norvasc 10 Mg Tabs (Amlodipine besylate) .... Take 1 tablet by mouth once a day 4)  Hydrochlorothiazide 12.5 Mg Tabs (Hydrochlorothiazide) .... Take 1 tablet by mouth once a day 5)  Aleve 220 Mg Caps (Naproxen sodium) .... Take one tab every six hours as needed 6)  Lancets Misc (Lancets) .... Once daily testing 7)  Prodigy Autocode Blood Glucose W/device Kit (Blood glucose monitoring suppl) .Marland KitchenMarland KitchenMarland Kitchen  Uad once daily testing 8)  Prodigy Blood Glucose Test Strp (Glucose blood) .... Uad once daily testing 9)  Prodigy Twist Top Lancets 28g Misc (Lancets) .... Uad once daily testing 10)  Betamethasone Dipropionate 0.05 % Crea (Betamethasone dipropionate) .... Apply twice daily to affectedareas for 2 weeks then as needed 11)  Flomax 0.4 Mg Caps (Tamsulosin hcl) .... Take 1 capsule by mouth once a day 12)  Lovastatin 40 Mg Tabs (Lovastatin) .... Take 1 tab by mouth at bedtime 13)  Donepezil Hcl 10 Mg Tbdp (Donepezil hcl) .... Take 1 tablet by mouth once a day 14)  Glipizide 10 Mg Tabs (Glipizide) .... Take 1 tablet by mouth two times a day 15)  Onglyza 5 Mg Tabs (Saxagliptin hcl) .... Take 1 tablet by mouth once a day  Patient Instructions: 1)  Please schedule a follow-up appointment in 3 months. 2)  BMP prior to visit, ICD-9: 3)  TSH prior to visit, ICD-9:  today 4)  HbgA1C prior to visit, ICD-9: 5)  the dose of aricept is increased  to 10mg  one daily 6)  Pls fill out forms for you and your brother for free onglyza. 7)  You will be referred to [facility] Prescriptions: DONEPEZIL HCL 10 MG TBDP (DONEPEZIL HCL) Take 1 tablet by mouth once a day  #30 x 4   Entered and  Authorized by:   Syliva Overman MD   Signed by:   Syliva Overman MD on 09/08/2010   Method used:   Printed then faxed to ...       [facility]* (retail)       [address]       [place], [place]  [address]       Ph: [phone]       Fax: [phone]   RxID:   [id number]

## 2010-12-29 NOTE — Assessment & Plan Note (Signed)
Summary: rash - room 3   Vital Signs:  Patient profile:   77 year old male Height:      70 inches Weight:      248.75 pounds BMI:     35.82 O2 Sat:      96 % on Room air Pulse rate:   87 / minute Resp:     16 per minute BP sitting:   130 / 80  (left arm)  Vitals Entered By: Adella Hare LPN (February 09, 2010 10:11 AM) CC: rash on hand and leg Is Patient Diabetic? Yes Did you bring your meter with you today? No Pain Assessment Patient in pain? no        Primary Provider:  Syliva Overman MD  CC:  rash on hand and leg.  History of Present Illness: Pt is here today with c/o rash for > 1 mos.  He states it started on his Lt knee.  Now it's on both of his ankles & Lt hand too.  It is sometimes itchy.  Not painful or irritated.  No dischg or drainage.  They start as small areas that increase in size over time.  Has tried otc triple antibiotic ointment & hydrocortisone cream & no improvement.  Upon further discussion pt mentions that he had little bit of cough this am.  "I coughed a couple times on the way here." Nonprod.  No chest congestion. He always has some nasal congestion (x yrs) & uses otc prods as needed.  No fever or chills.    Pt is pretty happy that he lost 4# since his last visit here.  States he has changed his diet.  Current Medications (verified): 1)  Metformin Hcl 500 Mg  Tabs (Metformin Hcl) .... Take Two Tablets By Mouth Twice Daily 2)  Aspirin 81 Mg  Tabs (Aspirin) .... Take 1 Tablet By Mouth Once A Day 3)  Simvastatin 40 Mg  Tabs (Simvastatin) .... Take 1 Tab By Mouth At Bedtime 4)  Norvasc 10 Mg  Tabs (Amlodipine Besylate) .... Take 1 Tablet By Mouth Once A Day 5)  Hydrochlorothiazide 12.5 Mg  Tabs (Hydrochlorothiazide) .... Take 1 Tablet By Mouth Once A Day 6)  Aleve 220 Mg Caps (Naproxen Sodium) .... Take One Tab Every Six Hours As Needed 7)  Lancets  Misc (Lancets) .... Once Daily Testing 8)  Glipizide 10 Mg Tabs (Glipizide) .... One Tab By Mouth Bid 9)   Prodigy Autocode Blood Glucose W/device Kit (Blood Glucose Monitoring Suppl) .... Uad Once Daily Testing 10)  Prodigy Blood Glucose Test  Strp (Glucose Blood) .... Uad Once Daily Testing 11)  Prodigy Twist Top Lancets 28g  Misc (Lancets) .... Uad Once Daily Testing 12)  Onglyza 5 Mg Tabs (Saxagliptin Hcl) .... One Tab By Mouth Qd 13)  Doxycycline Hyclate 100 Mg Caps (Doxycycline Hyclate) .... Take 1 Capsule By Mouth Two Times A Day  Allergies (verified): No Known Drug Allergies  Past History:  Past medical history reviewed for relevance to current acute and chronic problems.  Past Medical History: Reviewed history from 03/28/2008 and no changes required. Current Problems:  BENIGN PROSTATIC HYPERTROPHY, HX OF (ICD-V13.8) ARTHRITIS (ICD-716.90) OBESITY (ICD-278.00) DIABETES MELLITUS, TYPE I (ICD-250.01) HYPERTENSION (ICD-401.9)  Review of Systems General:  Denies chills and fever. ENT:  Complains of nasal congestion; denies earache, postnasal drainage, sinus pressure, and sore throat; chronic intermittent nasal congestion, no changes. CV:  Denies chest pain or discomfort. Resp:  Complains of cough; denies shortness of breath, sputum productive, and  wheezing. Derm:  Complains of rash. Heme:  Denies enlarge lymph nodes and fevers.  Physical Exam  General:  Well-developed,well-nourished,in no acute distress; alert,appropriate and cooperative throughout examination Head:  Normocephalic and atraumatic without obvious abnormalities. No apparent alopecia or balding. Ears:  External ear exam shows no significant lesions or deformities.  Otoscopic examination reveals clear canals, tympanic membranes are intact bilaterally without bulging, retraction, inflammation or discharge. Hearing is grossly normal bilaterally. Nose:  External nasal examination shows no deformity or inflammation. Nasal mucosa are pink and moist without lesions or exudates. Mouth:  Oral mucosa and oropharynx without  lesions or exudates.  Teeth in good repair. Neck:  No deformities, masses, or tenderness noted. Lungs:  Normal respiratory effort, chest expands symmetrically. Lungs are clear to auscultation, no crackles or wheezes. Heart:  Normal rate and regular rhythm. S1 and S2 normal without gallop, murmur, click, rub or other extra sounds. Skin:  annular erythematous dry lesions noted dorsum Lt hand, Lt anterior knee & bilat ankles.  Some of them have central clearing.  No pustules, or crusting. Cervical Nodes:  No lymphadenopathy noted Psych:  Cognition and judgment appear intact. Alert and cooperative with normal attention span and concentration. No apparent delusions, illusions, hallucinations   Impression & Recommendations:  Problem # 1:  TINEA CORPORIS (ICD-110.5) Assessment New Discussed with pt that this is a fungal infection. Use the rxd cream. If no improv 2 wks will need derm consult.  Problem # 2:  COUGH (ICD-786.2) Assessment: New Onset this am.  Probably secondary to chronic nasal congestion.   Pt is not too concerned or bothered by the cough he had earlier today.  Will monitor.  If syptoms increase or change to call the office.   Problem # 3:  OBESITY (ICD-278.00) Assessment: Improved Encouraged pt to continue with wt loss.  Complete Medication List: 1)  Metformin Hcl 500 Mg Tabs (Metformin hcl) .... Take two tablets by mouth twice daily 2)  Aspirin 81 Mg Tabs (Aspirin) .... Take 1 tablet by mouth once a day 3)  Simvastatin 40 Mg Tabs (Simvastatin) .... Take 1 tab by mouth at bedtime 4)  Norvasc 10 Mg Tabs (Amlodipine besylate) .... Take 1 tablet by mouth once a day 5)  Hydrochlorothiazide 12.5 Mg Tabs (Hydrochlorothiazide) .... Take 1 tablet by mouth once a day 6)  Aleve 220 Mg Caps (Naproxen sodium) .... Take one tab every six hours as needed 7)  Lancets Misc (Lancets) .... Once daily testing 8)  Glipizide 10 Mg Tabs (Glipizide) .... One tab by mouth bid 9)  Prodigy Autocode  Blood Glucose W/device Kit (Blood glucose monitoring suppl) .... Uad once daily testing 10)  Prodigy Blood Glucose Test Strp (Glucose blood) .... Uad once daily testing 11)  Prodigy Twist Top Lancets 28g Misc (Lancets) .... Uad once daily testing 12)  Onglyza 5 Mg Tabs (Saxagliptin hcl) .... One tab by mouth qd 13)  Doxycycline Hyclate 100 Mg Caps (Doxycycline hyclate) .... Take 1 capsule by mouth two times a day 14)  Nystatin-triamcinolone 100000-0.1 Unit/gm-% Crea (Nystatin-triamcinolone) .... Apply to rash bid  Patient Instructions: 1)  Please schedule a follow-up appointment as needed. 2)  Keep your next routine appt. 3)  If your cough worsens please call the office.  You may use an over the counter cough medication if needed. 4)  Use the prescription cream on the rash twice a day.  If the rash doesn't improve in the next 2 weeks please call the office for a referral to  the dermatologist. Prescriptions: NYSTATIN-TRIAMCINOLONE 100000-0.1 UNIT/GM-% CREA (NYSTATIN-TRIAMCINOLONE) apply to rash bid  #60 gram x 0   Entered and Authorized by:   Esperanza Sheets PA   Signed by:   Esperanza Sheets PA on 02/09/2010   Method used:   Electronically to        [facility]* (retail)       [address]       [place], [place]  [address]       Ph: [phone]       Fax: [phone]   RxID:   [id number]

## 2010-12-29 NOTE — Progress Notes (Signed)
Summary: CALL  Phone Note Call from Patient   Summary of Call: WANTS YOU TO CALL HIM AT HOME ABOUT B WORK Initial call taken by: [place] Guest,  January 12, 2010 8:06 AM  Follow-up for Phone Call        returned call, left message Follow-up by: Lilyan Gilford LPN,  January 12, 2010 8:39 AM  Additional Follow-up for Phone Call Additional follow up Details #1::        why was a 2 hour glucose test ordered? patient is already established diabetic Additional Follow-up by: Lilyan Gilford LPN,  January 12, 2010 1:43 PM    Additional Follow-up for Phone Call Additional follow up Details #2::    test ordered in error, and patient aware Follow-up by: Lilyan Gilford LPN,  January 12, 2010 2:20 PM

## 2010-12-29 NOTE — Assessment & Plan Note (Signed)
Summary: office visit   Vital Signs:  Patient profile:   77 year old male Height:      70 inches Weight:      239.25 pounds BMI:     34.45 O2 Sat:      97 % Pulse rate:   80 / minute Pulse rhythm:   regular Resp:     16 per minute BP sitting:   112 / 70  (left arm) Cuff size:   large  Vitals Entered By: Everitt Amber LPN (August 04, 2010 9:55 AM)  Nutrition Counseling: Patient's BMI is greater than 25 and therefore counseled on weight management options. CC: legs have been hurting very bad, can hardly walk, especially when air hits it   Primary Care Provider:  Syliva Overman MD  CC:  legs have been hurting very bad, can hardly walk, and especially when air hits it.  History of Present Illness: Pt hjas not been doing very well. He c/o primarily of continued right leg pain , primarily around the kne but extending up to the thigh.  There i some concern about memory loss alos which was recentlyexpressed by hisspouse.Onmini mental status there is evidence of MCI. he denies any recent fevr or chills or head and chest congestion hias apetite is good, and hs BM's are regular.  Current Medications (verified): 1)  Metformin Hcl 500 Mg  Tabs (Metformin Hcl) .... Take Two Tablets By Mouth Twice Daily 2)  Aspirin 81 Mg  Tabs (Aspirin) .... Take 1 Tablet By Mouth Once A Day 3)  Simvastatin 40 Mg  Tabs (Simvastatin) .... Take 1 Tab By Mouth At Bedtime 4)  Norvasc 10 Mg  Tabs (Amlodipine Besylate) .... Take 1 Tablet By Mouth Once A Day 5)  Hydrochlorothiazide 12.5 Mg  Tabs (Hydrochlorothiazide) .... Take 1 Tablet By Mouth Once A Day 6)  Aleve 220 Mg Caps (Naproxen Sodium) .... Take One Tab Every Six Hours As Needed 7)  Lancets  Misc (Lancets) .... Once Daily Testing 8)  Glipizide 10 Mg Tabs (Glipizide) .... One Tab By Mouth Bid 9)  Prodigy Autocode Blood Glucose W/device Kit (Blood Glucose Monitoring Suppl) .... Uad Once Daily Testing 10)  Prodigy Blood Glucose Test  Strp (Glucose Blood)  .... Uad Once Daily Testing 11)  Prodigy Twist Top Lancets 28g  Misc (Lancets) .... Uad Once Daily Testing 12)  Onglyza 5 Mg Tabs (Saxagliptin Hcl) .... One Tab By Mouth Qd 13)  Betamethasone Dipropionate 0.05 % Crea (Betamethasone Dipropionate) .... Apply Twice Daily To Affectedareas For 2 Weeks Then As Needed 14)  Indomethacin 50 Mg Caps (Indomethacin) .... Take 1 Three Times A Day For Knee Pain and Swelling  Allergies (verified): No Known Drug Allergies  Review of Systems      See HPI General:  Complains of fatigue, malaise, and weakness. Eyes:  Denies discharge and red eye. CV:  Denies chest pain or discomfort, palpitations, and swelling of feet. Resp:  Complains of shortness of breath; denies cough and sputum productive. GI:  Denies abdominal pain, constipation, diarrhea, nausea, and vomiting. GU:  Complains of incontinence, nocturia, and urinary frequency; nocturia x4 weeks, on avg  6 to 10 times/night. MS:  Complains of stiffness; right knee pain and thigh pain is less , will complete indometh and resume alleve. Neuro:  Complains of memory loss; 6 nmonth h/o increased short term memory l;oss. Psych:  Complains of anxiety; denies depression. Endo:  Denies excessive thirst and excessive urination; states sugars have ben uncontrolled becauseof being  unable to get onglyza. Heme:  Denies abnormal bruising and bleeding. Allergy:  Complains of seasonal allergies.  Physical Exam  General:  Well-developed,well-nourished,in no acute distress; alert,appropriate and cooperative throughout examination HEENT: No facial asymmetry,  EOMI, No sinus tenderness, TM's Clear, oropharynx  pink and moist.   Chest: Clear to auscultation bilaterally.  CVS: S1, S2, No murmurs, No S3.   Abd: Soft, Nontender.  MS: decreased ROM spine, hips, shoulders and knees.  Ext: No edema.   CNS: CN 2-12 intact, power tone and sensation normal throughout.   Skin: Intact, no visible lesions or rashes.  Psych: Good  eye contact, normal affect.  Memory intact, not anxious or depressed appearing.    Impression & Recommendations:  Problem # 1:  NOCTURIA (ZOX-096.04) Assessment Comment Only start a tial of flomax  Problem # 2:  MILD COGNITIVE IMPAIRMENT SO STATED (ICD-331.83) Assessment: Comment Only  Orders: Radiology Referral (Radiology) start aricept 5 mg, pt encouraged to increase reading etc  Problem # 3:  KNEE PAIN, RIGHT (ICD-719.46) Assessment: Improved  The following medications were removed from the medication list:    Indomethacin 50 Mg Caps (Indomethacin) .[name]... Take 1 three times a day for knee pain and swelling His updated medication list for this problem includes:    Aspirin 81 Mg Tabs (Aspirin) .[name]... Take 1 tablet by mouth once a day    Aleve 220 Mg Caps (Naproxen sodium) .[name]... Take one tab every six hours as needed  Problem # 4:  HYPERLIPIDEMIA (ICD-272.4) Assessment: Comment Only  The following medications were removed from the medication list:    Simvastatin 40 Mg Tabs (Simvastatin) .[name]... Take 1 tab by mouth at bedtime His updated medication list for this problem includes:    Lovastatin 40 Mg Tabs (Lovastatin) .[name]... Take 1 tab by mouth at bedtime  Labs Reviewed: SGOT: 15 (01/13/2010)   SGPT: 8 (01/13/2010)   HDL:44 (01/13/2010), 48 (07/31/2009)  LDL:70 (01/13/2010), 62 (07/31/2009)  Chol:128 (01/13/2010), 126 (07/31/2009)  Trig:72 (01/13/2010), 81 (07/31/2009)  Problem # 5:  HYPERTENSION (ICD-401.9) Assessment: Unchanged  His updated medication list for this problem includes:    Norvasc 10 Mg Tabs (Amlodipine besylate) .[name]... Take 1 tablet by mouth once a day    Hydrochlorothiazide 12.5 Mg Tabs (Hydrochlorothiazide) .[name]... Take 1 tablet by mouth once a day  BP today: 112/70 Prior BP: 130/60 (07/27/2010)  Labs Reviewed: K+: 4.5 (01/13/2010) Creat: : 1.20 (01/13/2010)   Chol: 128 (01/13/2010)   HDL: 44 (01/13/2010)   LDL: 70 (01/13/2010)   TG: 72  (01/13/2010)  Problem # 6:  GOITER, UNSPECIFIED (ICD-240.9) Assessment: Comment Only  Orders: Radiology Referral (Radiology)  Complete Medication List: 1)  Metformin Hcl 500 Mg Tabs (Metformin hcl) .... Take two tablets by mouth twice daily 2)  Aspirin 81 Mg Tabs (Aspirin) .... Take 1 tablet by mouth once a day 3)  Norvasc 10 Mg Tabs (Amlodipine besylate) .... Take 1 tablet by mouth once a day 4)  Hydrochlorothiazide 12.5 Mg Tabs (Hydrochlorothiazide) .... Take 1 tablet by mouth once a day 5)  Aleve 220 Mg Caps (Naproxen sodium) .... Take one tab every six hours as needed 6)  Lancets Misc (Lancets) .... Once daily testing 7)  Glipizide 10 Mg Tabs (Glipizide) .... One tab by mouth bid 8)  Prodigy Autocode Blood Glucose W/device Kit (Blood glucose monitoring suppl) .... Uad once daily testing 9)  Prodigy Blood Glucose Test Strp (Glucose blood) .... Uad once daily testing 10)  Prodigy Twist Top Lancets 28g Misc (  Lancets) .... Uad once daily testing 11)  Onglyza 5 Mg Tabs (Saxagliptin hcl) .... One tab by mouth qd 12)  Betamethasone Dipropionate 0.05 % Crea (Betamethasone dipropionate) .... Apply twice daily to affectedareas for 2 weeks then as needed 13)  Donepezil Hcl 5 Mg Tabs (Donepezil hcl) .... Take 1 tablet by mouth once a day 14)  Flomax 0.4 Mg Caps (Tamsulosin hcl) .... Take 1 capsule by mouth once a day 15)  Lovastatin 40 Mg Tabs (Lovastatin) .... Take 1 tab by mouth at bedtime  Other Orders: Pneumococcal Vaccine (84696) Admin 1st Vaccine (29528) Admin 1st Vaccine [facility]) [phone]) Influenza Vaccine NON MCR (01027)  Patient Instructions: 1)  f/u in 6 weeks 2)  It is important that you exercise regularly at least 20 minutes 5 times a week. If you develop chest pain, have severe difficulty breathing, or feel very tired , stop exercising immediately and seek medical attention. 3)  You need to lose weight. Consider a lower calorie diet and regular exercise.  4)  pLSstart reading  every day, watching the news,and start word search puzzles, this will also help your memory 5)  new meds for your memory and urine flow. 6)  You are being referred for thyroid scan and MRI if your brain Prescriptions: ONGLYZA 5 MG TABS (SAXAGLIPTIN HCL) one tab by mouth qd  #2 boxes x 0   Entered by:   Everitt Amber LPN   Authorized by:   Syliva Overman MD   Signed by:   Everitt Amber LPN on 25/36/6440   Method used:   Samples Given   RxID:   [id number] GLIPIZIDE 10 MG TABS (GLIPIZIDE) one tab by mouth bid  #60 x 4   Entered by:   Everitt Amber LPN   Authorized by:   Syliva Overman MD   Signed by:   Everitt Amber LPN on 32/95/1884   Method used:   Electronically to        [facility]* (retail)       [address]       [place], [place]  [address]       Ph: [phone]       Fax: [phone]   RxID:   [id number] HYDROCHLOROTHIAZIDE 12.5 MG  TABS (HYDROCHLOROTHIAZIDE) Take 1 tablet by mouth once a day  #30 x 4   Entered by:   Everitt Amber LPN   Authorized by:   Syliva Overman MD   Signed by:   Everitt Amber LPN on 61/60/7371   Method used:   Electronically to        [facility]* (retail)       [address]       [place], [place]  [address]       Ph: [phone]       Fax: [phone]   RxID:   [id number] NORVASC 10 MG  TABS (AMLODIPINE BESYLATE) Take 1 tablet by mouth once a day  #30 x 4   Entered by:   Everitt Amber LPN   Authorized by:   Syliva Overman MD   Signed by:   Everitt Amber LPN on 09/22/8526   Method used:   Electronically to        [facility]* (retail)       [address]       [place], [place]  [address]       Ph: [phone]       Fax: [phone]   RxID:   [id number] METFORMIN HCL 500 MG  TABS (METFORMIN HCL) Take two tablets by mouth twice daily  #120 x 4   Entered by:   Everitt Amber LPN   Authorized by:    Syliva Overman MD   Signed by:   Everitt Amber LPN on 84/13/2440   Method used:   Electronically to        [facility]* (retail)       [address]       [place], [place]  [address]       Ph: [phone]       Fax: [phone]   RxID:   [id number] LOVASTATIN 40 MG TABS (LOVASTATIN) Take 1 tab by mouth at bedtime  #30 x 3   Entered and Authorized by:   Syliva Overman MD   Signed by:   Syliva Overman MD on 08/04/2010   Method used:   Printed then faxed to ...       [facility]* (retail)       [address]       [place], [place]  [address]       Ph: [phone]       Fax: [phone]   RxID:   [id number] FLOMAX 0.4 MG CAPS (TAMSULOSIN HCL) Take 1 capsule by mouth once a day  #30 x 3   Entered and Authorized by:   Syliva Overman MD   Signed by:   Syliva Overman MD on 08/04/2010   Method used:   Electronically to        [facility]* (retail)       [address]       [place], [place]  [address]       Ph: [phone]       Fax: [phone]   RxID:   [id number] DONEPEZIL HCL 5 MG TABS (DONEPEZIL HCL) Take 1 tablet by mouth once a day  #30 x 3   Entered and Authorized by:   Syliva Overman MD   Signed by:   Syliva Overman MD on 08/04/2010   Method used:   Electronically to        [facility]* (retail)       [address]       [place], [place]  [address]       Ph: [phone]       Fax: [phone]   RxID:   [id number]    Pneumovax    Vaccine Type: Pneumovax    Site: right deltoid    Mfr: Merck    Dose: 0.5 ml    Route: IM    Given by: Everitt Amber LPN    Exp. Date: 02/11/2012    Lot #: [id number]  Influenza Vaccine    Vaccine Type: Fluvax Non-MCR    Site: left deltoid    Mfr: novartis    Dose: 0.5 ml    Route: IM    Given by: Everitt Amber LPN    Exp. Date: 03/2011     Lot #: [id number]

## 2010-12-29 NOTE — Progress Notes (Signed)
Summary: SAMPLES  Phone Note Call from Patient   Summary of Call: WANTS TO KNOW DO YOU HAVE SAMPLES OF HIS MEDICINE Initial call taken by: [place] Guest,  July 21, 2010 8:59 AM  Follow-up for Phone Call        left detailed message, advised none available at this time Follow-up by: Adella Hare LPN,  July 21, 2010 11:45 AM

## 2010-12-29 NOTE — Progress Notes (Signed)
Summary: [facility]  Phone Note Call from Patient   Summary of Call: eillen form [facility] called left message aricept dosage  ort. ref rehab @ home  lift chair see if [facility] [employer] will pay for it  needs home health ref for bathing call back at [phone] Initial call taken by: [place] Guest,  September 24, 2010 10:16 AM  Follow-up for Phone Call        returned call, left message Follow-up by: Adella Hare LPN,  September 24, 2010 10:17 AM  Additional Follow-up for Phone Call Additional follow up Details #1::        Phone Call Completed Additional Follow-up by: Adella Hare LPN,  September 25, 2010 11:29 AM

## 2010-12-29 NOTE — Progress Notes (Signed)
Summary: shot  Phone Note Call from Patient   Summary of Call: pt would like to know if he can get a shot in shoulder [phone] Initial call taken by: Rudene Anda,  December 29, 2009 2:50 PM  Follow-up for Phone Call        wants to know if he can have those shots you offered at last ov Follow-up by: Worthy Keeler LPN,  December 29, 2009 2:54 PM  Additional Follow-up for Phone Call Additional follow up Details #1::        nurse pls verify asnd document reason for shots i think it ios arthritis, iof so ok to give toradol 60mg  and depomedrol 80mg  iM tomorrow Additional Follow-up by: Syliva Overman MD,  December 29, 2009 5:57 PM    Additional Follow-up for Phone Call Additional follow up Details #2::    Obrian SAID HE FELT A LITTLE BETTER AND HE HAD BEEN TAKING ADVIL AND HE WILL WAIT FOR THE SHOT AT HIS APPT. 2.10.2011 Follow-up by: [place] Guest,  December 30, 2009 8:40 AM  Additional Follow-up for Phone Call Additional follow up Details #3:: Details for Additional Follow-up Action Taken: Noted Additional Follow-up by: Esperanza Sheets PA,  December 30, 2009 12:59 PM

## 2010-12-29 NOTE — Miscellaneous (Signed)
Summary: do evaluate for lift chair  do evaluate for lift chair   Imported By: [place] Guest 10/21/2010 10:28:45  _____________________________________________________________________  External Attachment:    Type:   Image     Comment:   External Document

## 2010-12-29 NOTE — Progress Notes (Signed)
Summary: SAMPLES  Phone Note Call from Patient   Reason for Call: Refill Medication Summary of Call: DO YOU HAVE SAMPLES OF ONGLYZA CALL BACK AND LET HIM KNOW Initial call taken by: [place] Guest,  March 05, 2010 3:24 PM  Follow-up for Phone Call        advised none at this time, will call when available Follow-up by: Adella Hare LPN,  March 05, 2010 4:53 PM

## 2010-12-29 NOTE — Progress Notes (Signed)
Summary: please advise  Phone Note Call from Patient   Summary of Call: patient needs you to call him, about getting him some help. Initial call taken by: Curtis Sites,  September 22, 2010 3:42 PM  Follow-up for Phone Call        STATES NEW DOSE OF ARICEPT IS NOT WORKING AND KEEPS HIM CONSTIPATION, AND ALSO CAN HE GET A LIFT CHAIR  STATES THAT THE [facility] HAS COME OUT AND WILL BE HELPING THEM Follow-up by: Adella Hare LPN,  September 23, 2010 11:44 AM  Additional Follow-up for Phone Call Additional follow up Details #1::        pls advie  to ensure adequate intake of fruit and vegetables daily, at least 3 servings of each, as well as water intake of at least 48 ounces daily, and regular exercise. OTC stool softeners are helpful for daily use , up to 4 daily eg colace. Fiber intake daily is needed, in the form of Bran, Shredded Wheat or senekot. continue the current dose of aricept for a while more if possible.Higinio Roger he needs to see ortho for eval opf the knee before he gets a lift chair   Additional Follow-up by: Syliva Overman MD,  September 23, 2010 1:16 PM    Additional Follow-up for Phone Call Additional follow up Details #2::    PATIENT AWARE Follow-up by: Adella Hare LPN,  September 25, 2010 3:23 PM

## 2010-12-29 NOTE — Letter (Signed)
Summary: rx assistance  rx assistance   Imported By: [place] Guest 10/12/2010 10:50:50  _____________________________________________________________________  External Attachment:    Type:   Image     Comment:   External Document

## 2010-12-29 NOTE — Progress Notes (Signed)
Summary: test  Phone Note Call from Patient   Summary of Call: pt states he doesn't have the money to do the 2 test that has been ordered for friday. just wanted doc to know [phone] Initial call taken by: Rudene Anda,  August 12, 2010 8:35 AM  Follow-up for Phone Call        noted, he needs to cancel it then, let him know Follow-up by: Syliva Overman MD,  August 12, 2010 9:03 AM  Additional Follow-up for Phone Call Additional follow up Details #1::        pt was called Additional Follow-up by: Rudene Anda,  August 12, 2010 11:38 AM

## 2010-12-29 NOTE — Progress Notes (Signed)
Summary: rx  Phone Note Call from Patient   Summary of Call: pt says pharm don't have his rx [phone]  Initial call taken by: Rudene Anda,  February 09, 2010 1:55 PM  Follow-up for Phone Call        gave order verbally Follow-up by: Adella Hare LPN,  February 09, 2010 2:14 PM

## 2010-12-29 NOTE — Assessment & Plan Note (Signed)
Summary: office visit   Vital Signs:  Patient profile:   77 year old male Height:      70 inches Weight:      252 pounds BMI:     36.29 O2 Sat:      94 % Pulse rate:   91 / minute Pulse rhythm:   regular Resp:     16 per minute BP sitting:   120 / 76 Cuff size:   large  Vitals Entered By: Everitt Amber (January 08, 2010 8:03 AM)  Nutrition Counseling: Patient's BMI is greater than 25 and therefore counseled on weight management options. CC: follow-up visit   CC:  follow-up visit.  Current Medications (verified): 1)  Metformin Hcl 500 Mg  Tabs (Metformin Hcl) .... Take Two Tablets By Mouth Twice Daily 2)  Aspirin 81 Mg  Tabs (Aspirin) .... Take 1 Tablet By Mouth Once A Day 3)  Simvastatin 40 Mg  Tabs (Simvastatin) .... Take 1 Tab By Mouth At Bedtime 4)  Norvasc 10 Mg  Tabs (Amlodipine Besylate) .... Take 1 Tablet By Mouth Once A Day 5)  Hydrochlorothiazide 12.5 Mg  Tabs (Hydrochlorothiazide) .... Take 1 Tablet By Mouth Once A Day 6)  Aleve 220 Mg Caps (Naproxen Sodium) .... Take One Tab Every Six Hours As Needed 7)  Lancets  Misc (Lancets) .... Once Daily Testing 8)  Glipizide 10 Mg Tabs (Glipizide) .... One Tab By Mouth Bid 9)  Prodigy Autocode Blood Glucose W/device Kit (Blood Glucose Monitoring Suppl) .... Uad Once Daily Testing 10)  Prodigy Blood Glucose Test  Strp (Glucose Blood) .... Uad Once Daily Testing 11)  Prodigy Twist Top Lancets 28g  Misc (Lancets) .... Uad Once Daily Testing 12)  Onglyza 5 Mg Tabs (Saxagliptin Hcl) .... One Tab By Mouth Qd  Allergies (verified): No Known Drug Allergies  Review of Systems Eyes:  Denies blurring and discharge; has had bilateral catarct surgery and isuTD with exam. MS:  Complains of joint pain and stiffness; bilateral knee pain and stiffness inc in the past 2 weeks, no falls. Derm:  Complains of lesion(s); right ankle lwesion x 3 weeks, has been using triple antibiotic oint with some respponse. Endo:  Denies cold intolerance,  excessive hunger, excessive thirst, excessive urination, heat intolerance, polyuria, and weight change; fasting sugars when tested are around 130 to 140. Heme:  Denies abnormal bruising and bleeding. Allergy:  Denies hives or rash.  Physical Exam  General:  alert, well-hydrated, and overweight-appearing. HEENT: No facial asymmetry,  EOMI, No sinus tenderness, TM's Clear, oropharynx  pink and moist.   Chest: Clear to auscultation bilaterally.  CVS: S1, S2, No murmurs, No S3.   Abd: Soft, Nontender.  MS: decreased ROM spine,adequate in  hips and shoulders and  reduced in the knees.  Ext: No edema.   CNS: CN 2-12 intact, power tone and sensation normal throughout.   Skin: Intact, no visible lesions or rashes.  Psych: Good eye contact, normal affect.  Memory intact, not anxious or depressed appearing.     Impression & Recommendations:  Problem # 1:  HYPERLIPIDEMIA (ICD-272.4) Assessment Comment Only  His updated medication list for this problem includes:    Simvastatin 40 Mg Tabs (Simvastatin) .[name]... Take 1 tab by mouth at bedtime  Orders: T-Hepatic Function [phone]) T-Lipid Profile [phone])  Labs Reviewed: SGOT: 14 (07/31/2009)   SGPT: 9 (07/31/2009)   HDL:48 (07/31/2009), 45 (03/20/2009)  LDL:62 (07/31/2009), 59 (03/20/2009)  Chol:126 (07/31/2009), 124 (03/20/2009)  Trig:81 (07/31/2009), 100 (03/20/2009)  Problem #  2:  HYPERTENSION (ICD-401.9) Assessment: Unchanged  His updated medication list for this problem includes:    Norvasc 10 Mg Tabs (Amlodipine besylate) .[name]... Take 1 tablet by mouth once a day    Hydrochlorothiazide 12.5 Mg Tabs (Hydrochlorothiazide) .[name]... Take 1 tablet by mouth once a day  Orders: T-Basic Metabolic Panel [phone])  BP today: 120/76 Prior BP: 122/80 (09/04/2009)  Labs Reviewed: K+: 4.5 (07/31/2009) Creat: : 1.26 (07/31/2009)   Chol: 126 (07/31/2009)   HDL: 48 (07/31/2009)   LDL: 62 (07/31/2009)   TG: 81 (07/31/2009)  Problem #  3:  KNEE PAIN, BILATERAL (ICD-719.46) Assessment: Deteriorated  His updated medication list for this problem includes:    Aspirin 81 Mg Tabs (Aspirin) .[name]... Take 1 tablet by mouth once a day    Aleve 220 Mg Caps (Naproxen sodium) .[name]... Take one tab every six hours as needed  Orders: Depo- Medrol 80mg  (J1040) Ketorolac-Toradol 15mg  (M8413) Admin of Therapeutic Inj  intramuscular or subcutaneous (24401)  Problem # 4:  DM (ICD-250.00) Assessment: Unchanged  His updated medication list for this problem includes:    Metformin Hcl 500 Mg Tabs (Metformin hcl) .[name]... Take two tablets by mouth twice daily    Aspirin 81 Mg Tabs (Aspirin) .[name]... Take 1 tablet by mouth once a day    Glipizide 10 Mg Tabs (Glipizide) ..... One tab by mouth bid    Onglyza 5 Mg Tabs (Saxagliptin hcl) ..... One tab by mouth qd  Orders: Glucose, (CBG) (82962) Hemoglobin A1C (83036) T- * Misc. Laboratory test [phone])  Labs Reviewed: Creat: 1.26 (07/31/2009)    Reviewed HgBA1c results: 7.4 (01/08/2010)  7.3 (09/04/2009)  Problem # 5:  OBESITY (ICD-278.00) Assessment: Comment Only  Ht: 70 (01/08/2010)   Wt: 252 (01/08/2010)   BMI: 36.29 (01/08/2010)  Complete Medication List: 1)  Metformin Hcl 500 Mg Tabs (Metformin hcl) .... Take two tablets by mouth twice daily 2)  Aspirin 81 Mg Tabs (Aspirin) .... Take 1 tablet by mouth once a day 3)  Simvastatin 40 Mg Tabs (Simvastatin) .... Take 1 tab by mouth at bedtime 4)  Norvasc 10 Mg Tabs (Amlodipine besylate) .... Take 1 tablet by mouth once a day 5)  Hydrochlorothiazide 12.5 Mg Tabs (Hydrochlorothiazide) .... Take 1 tablet by mouth once a day 6)  Aleve 220 Mg Caps (Naproxen sodium) .... Take one tab every six hours as needed 7)  Lancets Misc (Lancets) .... Once daily testing 8)  Glipizide 10 Mg Tabs (Glipizide) .... One tab by mouth bid 9)  Prodigy Autocode Blood Glucose W/device Kit (Blood glucose monitoring suppl) .... Uad once daily testing 10)  Prodigy Blood  Glucose Test Strp (Glucose blood) .... Uad once daily testing 11)  Prodigy Twist Top Lancets 28g Misc (Lancets) .... Uad once daily testing 12)  Onglyza 5 Mg Tabs (Saxagliptin hcl) .... One tab by mouth qd 13)  Doxycycline Hyclate 100 Mg Caps (Doxycycline hyclate) .... Take 1 capsule by mouth two times a day  Patient Instructions: 1)  Please schedule a follow-up appointment in 4 months. 2)  It is important that you exercise regularly at least 20 minutes 5 times a week. If you develop chest pain, have severe difficulty breathing, or feel very tired , stop exercising immediately and seek medical attention. 3)  You need to lose weight. Consider a lower calorie diet and regular exercise.  4)  BMP prior to visit, ICD-9: 5)  Hepatic Panel prior to visit, ICD-9:   fasting today 6)  Lipid Panel prior to  visit, ICD-9: 7)  You will get injections in the office today for your knees. Prescriptions: DOXYCYCLINE HYCLATE 100 MG CAPS (DOXYCYCLINE HYCLATE) Take 1 capsule by mouth two times a day  #14 x 0   Entered and Authorized by:   Syliva Overman MD   Signed by:   Syliva Overman MD on 01/08/2010   Method used:   Electronically to        [facility]* (retail)       [address]       [place], [place]  [address]       Ph: [phone]       Fax: [phone]   RxID:   [id number]   Laboratory Results   Blood Tests   Date/Time Received: January 08, 2010  Date/Time Reported: January 08, 2010   Glucose (fasting): 276 mg/dL   (Normal Range: 84-132) HGBA1C: 7.4%   (Normal Range: Non-Diabetic - 3-6%   Control Diabetic - 6-8%)       Medication Administration  Injection # 1:    Medication: Depo- Medrol 80mg     Diagnosis: KNEE PAIN, BILATERAL (ICD-719.46)    Route: IM    Site: RUOQ gluteus    Exp Date: 10/11    Lot #: [id number]    Mfr: Pharmacia    Comments: 80 mg given     Patient tolerated injection without complications    Given by:  Everitt Amber (January 08, 2010 9:14 AM)  Injection # 2:    Medication: Ketorolac-Toradol 15mg     Diagnosis: KNEE PAIN, BILATERAL (ICD-719.46)    Route: IM    Site: LUOQ gluteus    Exp Date: 06/2011    Lot #: [id number]    Mfr: [email or web address]    Comments: 60 mg given     Patient tolerated injection without complications    Given by: Everitt Amber (January 08, 2010 9:15 AM)  Orders Added: 1)  Est. Patient Level IV [44010] 2)  Glucose, (CBG) [82962] 3)  Hemoglobin A1C [83036] 4)  T-Basic Metabolic Panel [80048-22910] 5)  T-Hepatic Function [80076-22960] 6)  T-Lipid Profile [80061-22930] 7)  T- * Misc. Laboratory test [phone] 8)  Depo- Medrol 80mg  [J1040] 9)  Ketorolac-Toradol 15mg  [J1885] 10)  Admin of Therapeutic Inj  intramuscular or subcutaneous [66440]

## 2010-12-29 NOTE — Progress Notes (Signed)
Summary: samples  Phone Note Call from Patient   Summary of Call: pt needs samples and wants to know if we have any in. please call back [phone] Initial call taken by: Rudene Anda,  Apr 14, 2010 9:23 AM  Follow-up for Phone Call        returned call, left message Follow-up by: Adella Hare LPN,  Apr 14, 2010 3:29 PM  Additional Follow-up for Phone Call Additional follow up Details #1::        samples provided Additional Follow-up by: Adella Hare LPN,  Apr 14, 2010 3:31 PM

## 2010-12-29 NOTE — Progress Notes (Signed)
Summary: please advise  Phone Note Call from Patient   Summary of Call: Samuel Ryan states that he still has rash on ankles, and waist, he states it is itching really bad and wanted to know if we have any samples of onglyza samples.  Wants an appt. with skin specialist. Initial call taken by: Curtis Sites,  October 05, 2010 9:00 AM  Follow-up for Phone Call        advised samples are available  and have to get dr simpson to approve skin specialist Follow-up by: Adella Hare LPN,  October 05, 2010 2:37 PM  Additional Follow-up for Phone Call Additional follow up Details #1::        pls refer pt to derm eval rash it will gho to ferral box also, and provide samples. You will see the note i left on their application for onglyza along with the forms also Additional Follow-up by: Syliva Overman MD,  October 05, 2010 2:53 PM    Additional Follow-up for Phone Call Additional follow up Details #2::    appt made Follow-up by: Adella Hare LPN,  October 06, 2010 11:20 AM

## 2010-12-29 NOTE — Progress Notes (Signed)
Summary: samples  Phone Note Call from Patient   Summary of Call: pt wants to get samples onglyza [phone] Initial call taken by: Rudene Anda,  May 22, 2010 8:18 AM  Follow-up for Phone Call        advised unavailable at this time, will call if some becomes available Follow-up by: Adella Hare LPN,  May 22, 2010 11:44 AM

## 2010-12-29 NOTE — Progress Notes (Signed)
Summary: SAMPLES    Phone Note Call from Patient   Summary of Call: NEEDS SAMPLES OG ONG. AND SOMETHING ABOUT NATHANIEL  CALL BACK AT [phone]  WILL BE HOME AFTER 11:00 CALL BACK Initial call taken by: Lind Guest,  June 15, 2010 8:08 AM  Follow-up for Phone Call        returned call, left message, advises samples available Follow-up by: Adella Hare LPN,  June 15, 2010 9:49 AM

## 2010-12-29 NOTE — Letter (Signed)
Summary: APPROVED MRI  APPROVED MRI   Imported By: Lind Guest 08/13/2010 09:00:29  _____________________________________________________________________  External Attachment:    Type:   Image     Comment:   External Document

## 2010-12-29 NOTE — Progress Notes (Signed)
Summary: rx  Phone Note Call from Patient   Summary of Call: the drug has not received his rx   Initial call taken by: Lind Guest,  February 09, 2010 12:59 PM  Follow-up for Phone Call        called in verbally Follow-up by: Adella Hare LPN,  February 09, 2010 2:14 PM

## 2010-12-29 NOTE — Letter (Signed)
Summary: [facility]  [facility]   Imported By: Lind Guest 11/03/2010 09:34:08  _____________________________________________________________________  External Attachment:    Type:   Image     Comment:   External Document

## 2010-12-29 NOTE — Assessment & Plan Note (Signed)
Summary: leg pain- room 1   Vital Signs:  Patient profile:   77 year old male Height:      70 inches Weight:      244.50 pounds BMI:     35.21 O2 Sat:      96 % on Room air Pulse rate:   91 / minute Resp:     16 per minute BP sitting:   130 / 60  (left arm)  Vitals Entered By: Adella Hare LPN (July 27, 2010 10:06 AM) CC: right leg pain over the weekend goes up into groin area Is Patient Diabetic? Yes   Primary Provider:  Syliva Overman MD  CC:  right leg pain over the weekend goes up into groin area.  History of Present Illness: Pt presents today with daugher-in-law.  He is concerned about Rt knee pain and swelling.  Hx of DJD bilat knees.  Rt knee has been more painful and swollen the last 2 weeks.  He states yesterday it was red and warm to touch.  He has also had swelling in his Rt lower leg x 2-3 days.  He states he intermittently gets this, but is persisting longer than usual this time.  No trauma.  Occ has pain in Rt thigh too.  Hx of htn. Taking medications as prescribed. No headache, chest pain or palpitations.   After visit, pts daughter-in-law stopped me in the hallway and said that the family has noticed that Mr Ozburn is forgetting things alot more. Seems to get easily lost when driving.  And when comes home after Dr visits cannot remember what was said.  She said the family is planning to have someone come with him to his Dr visits from now on.  Advised her that we would need to address this at a future visit, and that he probably shouldnt drive anymore if he is truly getting lost and having memory problems.   Current Medications (verified): 1)  Metformin Hcl 500 Mg  Tabs (Metformin Hcl) .... Take Two Tablets By Mouth Twice Daily 2)  Aspirin 81 Mg  Tabs (Aspirin) .... Take 1 Tablet By Mouth Once A Day 3)  Simvastatin 40 Mg  Tabs (Simvastatin) .... Take 1 Tab By Mouth At Bedtime 4)  Norvasc 10 Mg  Tabs (Amlodipine Besylate) .... Take 1 Tablet By Mouth Once A  Day 5)  Hydrochlorothiazide 12.5 Mg  Tabs (Hydrochlorothiazide) .... Take 1 Tablet By Mouth Once A Day 6)  Aleve 220 Mg Caps (Naproxen Sodium) .... Take One Tab Every Six Hours As Needed 7)  Lancets  Misc (Lancets) .... Once Daily Testing 8)  Glipizide 10 Mg Tabs (Glipizide) .... One Tab By Mouth Bid 9)  Prodigy Autocode Blood Glucose W/device Kit (Blood Glucose Monitoring Suppl) .... Uad Once Daily Testing 10)  Prodigy Blood Glucose Test  Strp (Glucose Blood) .... Uad Once Daily Testing 11)  Prodigy Twist Top Lancets 28g  Misc (Lancets) .... Uad Once Daily Testing 12)  Onglyza 5 Mg Tabs (Saxagliptin Hcl) .... One Tab By Mouth Qd 13)  Betamethasone Dipropionate 0.05 % Crea (Betamethasone Dipropionate) .... Apply Twice Daily To Affectedareas For 2 Weeks Then As Needed 14)  Septra Ds 800-160 Mg Tabs (Sulfamethoxazole-Trimethoprim) .... Take 1 Tablet By Mouth Two Times A Day  Allergies (verified): No Known Drug Allergies  Past History:  Past medical history reviewed for relevance to current acute and chronic problems.  Past Medical History: Reviewed history from 03/28/2008 and no changes required. Current Problems:  BENIGN PROSTATIC  HYPERTROPHY, HX OF (ICD-V13.8) ARTHRITIS (ICD-716.90) OBESITY (ICD-278.00) DIABETES MELLITUS, TYPE I (ICD-250.01) HYPERTENSION (ICD-401.9)  Review of Systems General:  Denies chills and fever. CV:  Denies chest pain or discomfort. Resp:  Denies shortness of breath. MS:  Complains of joint pain, joint redness, and joint swelling.  Physical Exam  General:  Well-developed,well-nourished,in no acute distress; alert,appropriate and cooperative throughout examination Head:  Normocephalic and atraumatic without obvious abnormalities. No apparent alopecia or balding. Lungs:  Normal respiratory effort, chest expands symmetrically. Lungs are clear to auscultation, no crackles or wheezes. Heart:  Normal rate and regular rhythm. S1 and S2 normal without gallop,  murmur, click, rub or other extra sounds. Msk:  Rt knee:  No erythema or warmth to touch noted today.  + effusion.  Nontender to palp.  No ligament instablity. Pulses:  R posterior tibial normal, R dorsalis pedis normal, L posterior tibial normal, and L dorsalis pedis normal.   Extremities:  2+ PTE Rt.  Nontender calf and Neg Homans No PTE Lt.  Neurologic:  sensation intact to light touch.  Ambulating with a walker. Psych:  normally interactive and good eye contact.     Impression & Recommendations:  Problem # 1:  KNEE PAIN, RIGHT (ICD-719.46) Assessment Deteriorated  His updated medication list for this problem includes:    Aspirin 81 Mg Tabs (Aspirin) .[name]... Take 1 tablet by mouth once a day    Aleve 220 Mg Caps (Naproxen sodium) .[name]... Take one tab every six hours as needed    Indomethacin 50 Mg Caps (Indomethacin) .[name]... Take 1 three times a day for knee pain and swelling  Orders: Miscellaneous Other Radiology (Misc Other Rad) T-CBC No Diff (04540-98119) T-Uric Acid (Blood) (14782-95621) Venous Duplex Lower Extremity (Venous Dup Lower E) Ketorolac-Toradol 15mg  (H0865) Admin of Therapeutic Inj  intramuscular or subcutaneous (78469)  Problem # 2:  PERIPHERAL EDEMA (ICD-782.3) Assessment: New  His updated medication list for this problem includes:    Hydrochlorothiazide 12.5 Mg Tabs (Hydrochlorothiazide) .[name]... Take 1 tablet by mouth once a day  Orders: Venous Duplex Lower Extremity (Venous Dup Lower E)  Problem # 3:  HYPERTENSION (ICD-401.9) Assessment: Comment Only  His updated medication list for this problem includes:    Norvasc 10 Mg Tabs (Amlodipine besylate) .[name]... Take 1 tablet by mouth once a day    Hydrochlorothiazide 12.5 Mg Tabs (Hydrochlorothiazide) .[name]... Take 1 tablet by mouth once a day  BP today: 130/60 Prior BP: 120/80 (06/05/2010)  Labs Reviewed: K+: 4.5 (01/13/2010) Creat: : 1.20 (01/13/2010)   Chol: 128 (01/13/2010)   HDL: 44 (01/13/2010)   LDL: 70  (01/13/2010)   TG: 72 (01/13/2010)  Complete Medication List: 1)  Metformin Hcl 500 Mg Tabs (Metformin hcl) .... Take two tablets by mouth twice daily 2)  Aspirin 81 Mg Tabs (Aspirin) .... Take 1 tablet by mouth once a day 3)  Simvastatin 40 Mg Tabs (Simvastatin) .... Take 1 tab by mouth at bedtime 4)  Norvasc 10 Mg Tabs (Amlodipine besylate) .... Take 1 tablet by mouth once a day 5)  Hydrochlorothiazide 12.5 Mg Tabs (Hydrochlorothiazide) .... Take 1 tablet by mouth once a day 6)  Aleve 220 Mg Caps (Naproxen sodium) .... Take one tab every six hours as needed 7)  Lancets Misc (Lancets) .... Once daily testing 8)  Glipizide 10 Mg Tabs (Glipizide) .... One tab by mouth bid 9)  Prodigy Autocode Blood Glucose W/device Kit (Blood glucose monitoring suppl) .... Uad once daily testing 10)  Prodigy Blood Glucose Test Strp (  Glucose blood) .... Uad once daily testing 11)  Prodigy Twist Top Lancets 28g Misc (Lancets) .... Uad once daily testing 12)  Onglyza 5 Mg Tabs (Saxagliptin hcl) .... One tab by mouth qd 13)  Betamethasone Dipropionate 0.05 % Crea (Betamethasone dipropionate) .... Apply twice daily to affectedareas for 2 weeks then as needed 14)  Septra Ds 800-160 Mg Tabs (Sulfamethoxazole-trimethoprim) .... Take 1 tablet by mouth two times a day 15)  Indomethacin 50 Mg Caps (Indomethacin) .... Take 1 three times a day for knee pain and swelling  Patient Instructions: 1)  Follow up appt in one week. 2)  Have knee xray, blood work, and doppler/ultrasound on your Rt leg done today. 3)  Stop taking Aleve.  I have prescribed Indomethacin in place of it. 4)  Elevate your leg. 5)  Use ice on your knee to help with swelling. Prescriptions: INDOMETHACIN 50 MG CAPS (INDOMETHACIN) take 1 three times a day for knee pain and swelling  #30 x 0   Entered and Authorized by:   Esperanza Sheets PA   Signed by:   Esperanza Sheets PA on 07/27/2010   Method used:   Electronically to        [facility]* (retail)       [address]       [place], [place]  [address]       Ph: [phone]       Fax: [phone]   RxID:   [id number]    Medication Administration  Injection # 1:    Medication: Ketorolac-Toradol 15mg     Diagnosis: KNEE PAIN, RIGHT (ICD-719.46)    Route: IM    Site: RUOQ gluteus    Exp Date: 01/28/2012    Lot #: [id number]    Mfr: [email or web address]    Comments: toradol 60mg  given    Patient tolerated injection without complications    Given by: Adella Hare LPN (July 27, 2010 11:23 AM)  Orders Added: 1)  Miscellaneous Other Radiology [facility] Other Rad] 2)  T-CBC No Diff [85027-10000] 3)  T-Uric Acid (Blood) [01027-25366] 4)  Venous Duplex Lower Extremity [Venous Dup Lower E] 5)  Ketorolac-Toradol 15mg  [J1885] 6)  Admin of Therapeutic Inj  intramuscular or subcutaneous [96372] 7)  Est. Patient Level IV [44034]

## 2010-12-29 NOTE — Progress Notes (Signed)
  Phone Note From Pharmacy   Caller: [facility]* Summary of Call: needs refill on nystatin cream  call or fax escribe down Initial call taken by: Adella Hare LPN,  February 20, 2010 11:51 AM  Follow-up for Phone Call        pls refill x 2 from what they already have on file and let pt know pls, see old med list,nystatin triamcinolone cream Follow-up by: Syliva Overman MD,  February 20, 2010 4:09 PM  Additional Follow-up for Phone Call Additional follow up Details #1::        Patient aware Additional Follow-up by: Everitt Amber LPN,  February 20, 2010 4:14 PM    Prescriptions: NYSTATIN-TRIAMCINOLONE 100000-0.1 UNIT/GM-% CREA (NYSTATIN-TRIAMCINOLONE) apply to rash bid  #60 gram x 1   Entered by:   Everitt Amber LPN   Authorized by:   Syliva Overman MD   Signed by:   Everitt Amber LPN on 40/98/1191   Method used:   Electronically to        [facility]* (retail)       [address]       [place], [place]  [address]       Ph: [phone]       Fax: [phone]   RxID:   [id number]

## 2010-12-29 NOTE — Progress Notes (Signed)
Summary: [facility]  [facility]   Imported By: Lind Guest 12/03/2009 10:09:13  _____________________________________________________________________  External Attachment:    Type:   Image     Comment:   External Document

## 2010-12-29 NOTE — Progress Notes (Signed)
Summary: Office Visit  Office Visit   Imported By: Lind Guest 06/19/2010 11:36:23  _____________________________________________________________________  External Attachment:    Type:   Image     Comment:   External Document

## 2010-12-31 NOTE — Assessment & Plan Note (Signed)
Summary: CERTIFICATION   Vital Signs:  Patient profile:   77 year old male Weight:      221.4 pounds Pulse rate:   72 / minute Pulse rhythm:   regular BP sitting:   120 / 80  (left arm)  Allergies: No Known Drug Allergies  Review of Systems MS:  Complains of joint pain and stiffness; right hiop.   Complete Medication List: 1)  Metformin Hcl 500 Mg Tabs (Metformin hcl) .... Take two tablets by mouth twice daily 2)  Aspirin 81 Mg Tabs (Aspirin) .... Take 1 tablet by mouth once a day 3)  Norvasc 10 Mg Tabs (Amlodipine besylate) .... Take 1 tablet by mouth once a day 4)  Hydrochlorothiazide 12.5 Mg Tabs (Hydrochlorothiazide) .... Take 1 tablet by mouth once a day 5)  Aleve 220 Mg Caps (Naproxen sodium) .... Take one tab every six hours as needed 6)  Lancets Misc (Lancets) .... Once daily testing 7)  Prodigy Autocode Blood Glucose W/device Kit (Blood glucose monitoring suppl) .... Uad once daily testing 8)  Prodigy Blood Glucose Test Strp (Glucose blood) .... Uad once daily testing 9)  Prodigy Twist Top Lancets 28g Misc (Lancets) .... Uad once daily testing 10)  Betamethasone Dipropionate 0.05 % Crea (Betamethasone dipropionate) .... Apply twice daily to affectedareas for 2 weeks then as needed 11)  Flomax 0.4 Mg Caps (Tamsulosin hcl) .... Take 1 capsule by mouth once a day 12)  Lovastatin 40 Mg Tabs (Lovastatin) .... Take 1 tab by mouth at bedtime 13)  Glipizide 10 Mg Tabs (Glipizide) .... Take 1 tablet by mouth two times a day 14)  Onglyza 5 Mg Tabs (Saxagliptin hcl) .... Take 1 tablet by mouth once a day   Orders Added: 1)  New Certification-Home Health [G0180]

## 2010-12-31 NOTE — Progress Notes (Signed)
Summary: please advise  Phone Note Other Incoming   Caller: [facility] [phone] Summary of Call: Patient's wife called and asked them when the therapist was going to come and out and see Samuel Ryan.  Alvino Chapel wasn't sure what Samuel Ryan was referring to so she would like for [place] to call and see if the wife can tell us.  Please advise Initial call taken by: Samuel Ryan,  November 17, 2010 3:56 PM  Follow-up for Phone Call        pls refer for evaluation by advanced to see if he can get therapy at home for arthritis of the knees limiting mobility, he isd limited in his ability to drive to visits andI think qualifies for homebound status Follow-up by: Samuel Overman MD,  November 17, 2010 4:38 PM  Additional Follow-up for Phone Call Additional follow up Details #1::        referal sent Additional Follow-up by: Samuel Hare LPN,  December 02, 2010 10:18 AM

## 2010-12-31 NOTE — Letter (Signed)
Summary: Letter  Letter   Imported By: Lind Guest 12/14/2010 15:16:37  _____________________________________________________________________  External Attachment:    Type:   Image     Comment:   External Document

## 2010-12-31 NOTE — Progress Notes (Signed)
Summary: wants to talk to Dr. Lodema Hong  Phone Note Call from Patient   Summary of Call: wants you to call him he went to the dr in [place] today forgot his name and the doctor is going to do surgery on his leg. Call back at [phone] Initial call taken by: Lind Guest,  December 16, 2010 4:58 PM  Follow-up for Phone Call        returned patients call and he states he needs to speak with doc directly Follow-up by: Adella Hare LPN,  December 16, 2010 4:59 PM  Additional Follow-up for Phone Call Additional follow up Details #1::        basically wants me to hear to expect a form for medical clearance which I am prepared to do basedon current data Additional Follow-up by: Syliva Overman MD,  December 17, 2010 5:44 PM

## 2010-12-31 NOTE — Letter (Signed)
Summary: [facility]  [facility]   Imported By: Lind Guest 12/22/2010 10:44:26  _____________________________________________________________________  External Attachment:    Type:   Image     Comment:   External Document

## 2010-12-31 NOTE — Letter (Signed)
Summary: certifiacation  certifiacation   Imported By: Lind Guest 12/14/2010 15:17:33  _____________________________________________________________________  External Attachment:    Type:   Image     Comment:   External Document

## 2010-12-31 NOTE — Letter (Signed)
Summary: [facility]  [facility]   Imported By: Lind Guest 12/24/2010 11:49:13  _____________________________________________________________________  External Attachment:    Type:   Image     Comment:   External Document

## 2010-12-31 NOTE — Miscellaneous (Signed)
[facility]   Imported By: Lind Guest 12/02/2010 11:28:33  _____________________________________________________________________  External Attachment:    Type:   Image     Comment:   External Document

## 2010-12-31 NOTE — Letter (Signed)
Summary: handicapp   handicapp   Imported By: Lind Guest 12/11/2010 13:27:41  _____________________________________________________________________  External Attachment:    Type:   Image     Comment:   External Document

## 2010-12-31 NOTE — Assessment & Plan Note (Signed)
Summary: f up   Vital Signs:  Patient profile:   77 year old male Height:      70 inches Weight:      248.50 pounds BMI:     35.78 O2 Sat:      96 % Pulse rate:   87 / minute Pulse rhythm:   regular Resp:     16 per minute BP sitting:   128 / 80  (left arm) Cuff size:   large  Vitals Entered By: Everitt Amber LPN (December 11, 2010 8:18 AM)  Nutrition Counseling: Patient's BMI is greater than 25 and therefore counseled on weight management options. CC: has been taking therapy and he's developed a hurting in the right side/hip area   Primary Care Provider:  Syliva Overman MD  CC:  has been taking therapy and he's developed a hurting in the right side/hip area.  History of Present Illness: Reports  that he has been doing fairly well. He has been getting therapy for the severe arthritis in his knees, and is now convinced that he needs knee surgery asnd wants a referral. Denies recent fever or chills. Denies sinus pressure, nasal congestion , ear pain or sore throat. Denies chest congestion, or cough productive of sputum. Denies chest pain, palpitations, PND, orthopnea or leg swelling. Denies abdominal pain, nausea, vomitting, diarrhea or constipation. Denies change in bowel movements or bloody stool. Denies dysuria , frequency, incontinence or hesitancy. Denies polyuria, polydypsia or blurred vision. Denies headaches, vertigo, seizures. Denies depression, anxiety or insomnia. Denies  rash, lesions, or itch.     Current Medications (verified): 1)  Metformin Hcl 500 Mg  Tabs (Metformin Hcl) .... Take Two Tablets By Mouth Twice Daily 2)  Aspirin 81 Mg  Tabs (Aspirin) .... Take 1 Tablet By Mouth Once A Day 3)  Norvasc 10 Mg  Tabs (Amlodipine Besylate) .... Take 1 Tablet By Mouth Once A Day 4)  Hydrochlorothiazide 12.5 Mg  Tabs (Hydrochlorothiazide) .... Take 1 Tablet By Mouth Once A Day 5)  Aleve 220 Mg Caps (Naproxen Sodium) .... Take One Tab Every Six Hours As Needed 6)   Lancets  Misc (Lancets) .... Once Daily Testing 7)  Prodigy Autocode Blood Glucose W/device Kit (Blood Glucose Monitoring Suppl) .... Uad Once Daily Testing 8)  Prodigy Blood Glucose Test  Strp (Glucose Blood) .... Uad Once Daily Testing 9)  Prodigy Twist Top Lancets 28g  Misc (Lancets) .... Uad Once Daily Testing 10)  Betamethasone Dipropionate 0.05 % Crea (Betamethasone Dipropionate) .... Apply Twice Daily To Affectedareas For 2 Weeks Then As Needed 11)  Flomax 0.4 Mg Caps (Tamsulosin Hcl) .... Take 1 Capsule By Mouth Once A Day 12)  Lovastatin 40 Mg Tabs (Lovastatin) .... Take 1 Tab By Mouth At Bedtime 13)  Glipizide 10 Mg Tabs (Glipizide) .... Take 1 Tablet By Mouth Two Times A Day 14)  Onglyza 5 Mg Tabs (Saxagliptin Hcl) .... Take 1 Tablet By Mouth Once A Day  Allergies (verified): No Known Drug Allergies  Review of Systems      See HPI General:  Complains of fatigue. Eyes:  Complains of vision loss-both eyes; denies discharge and red eye; wears corrective lenses. MS:  Complains of joint pain and stiffness; left knee increased pin and instability, ready for replacemenrt, left hip pain since yesterday. Endo:  Denies excessive hunger and excessive thirst. Heme:  Denies abnormal bruising and bleeding. Allergy:  Denies hives or rash.  Physical Exam  General:  Well-developed,well-nourished,in no acute distress; alert,appropriate and cooperative  throughout examination HEENT: No facial asymmetry,  EOMI, No sinus tenderness, TM's Clear, oropharynx  pink and moist.   Chest: Clear to auscultation bilaterally.  CVS: S1, S2, No murmurs, No S3.   Abd: Soft, Nontender.  MS: decreased  ROM spine, right hip, and reduced in  knees. Right greater than left. Ext: No edema.   CNS: CN 2-12 intact, power tone and sensation normal throughout.   Skin: Intact, no visible lesions or rashes.  Psych: Good eye contact, normal affect.  Memory loss, not anxious or depressed appearing.   Diabetes  Management Exam:    Foot Exam (with socks and/or shoes not present):       Sensory-Monofilament:          Left foot: diminished          Right foot: diminished       Inspection:          Left foot: normal          Right foot: normal       Nails:          Left foot: normal          Right foot: normal   Impression & Recommendations:  Problem # 1:  DEMENTIA (ICD-294.8) Assessment Unchanged  Problem # 2:  KNEE PAIN, RIGHT (ICD-719.46) Assessment: Deteriorated  His updated medication list for this problem includes:    Aspirin 81 Mg Tabs (Aspirin) .[name]... Take 1 tablet by mouth once a day    Aleve 220 Mg Caps (Naproxen sodium) .[name]... Take one tab every six hours as needed now agreeable to surgical eval and treatment  Problem # 3:  HYPERTENSION (ICD-401.9) Assessment: Unchanged  His updated medication list for this problem includes:    Norvasc 10 Mg Tabs (Amlodipine besylate) .[name]... Take 1 tablet by mouth once a day    Hydrochlorothiazide 12.5 Mg Tabs (Hydrochlorothiazide) .[name]... Take 1 tablet by mouth once a day  Orders: Medicare Electronic Prescription [phone]) EKG w/ Interpretation (93000), preop nSR , no evidence of ischemia, pt is asymptomatic also T-CMP with estimated GFR ([address])  BP today: 128/80 Prior BP: 130/70 (09/08/2010)  Labs Reviewed: K+: 4.0 (09/08/2010) Creat: : 1.17 (09/08/2010)   Chol: 128 (01/13/2010)   HDL: 44 (01/13/2010)   LDL: 70 (01/13/2010)   TG: 72 (01/13/2010)  Problem # 4:  DM (ICD-250.00) Assessment: Improved  His updated medication list for this problem includes:    Metformin Hcl 500 Mg Tabs (Metformin hcl) .[name]... Take two tablets by mouth twice daily    Aspirin 81 Mg Tabs (Aspirin) .[name]... Take 1 tablet by mouth once a day    Glipizide 10 Mg Tabs (Glipizide) .[name]... Take 1 tablet by mouth two times a day    Onglyza 5 Mg Tabs (Saxagliptin hcl) .[name]... Take 1 tablet by mouth once a day  Orders: Medicare Electronic Prescription [phone]) T-  Hemoglobin A1C [phone])  Labs Reviewed: Creat: 1.17 (09/08/2010)    Reviewed HgBA1c results: 7.1 (09/08/2010)  7.4 (06/05/2010)  Problem # 5:  HIP PAIN, RIGHT (ICD-719.45) Assessment: Deteriorated  His updated medication list for this problem includes:    Aspirin 81 Mg Tabs (Aspirin) .[name]... Take 1 tablet by mouth once a day    Aleve 220 Mg Caps (Naproxen sodium) .[name]... Take one tab every six hours as needed  Orders: Orthopedic Referral (Ortho) Ketorolac-Toradol 15mg  (M5784) Admin of Therapeutic Inj  intramuscular or subcutaneous (69629)  Complete Medication List: 1)  Metformin Hcl 500 Mg Tabs (Metformin hcl) .Marland KitchenMarland KitchenMarland Kitchen  Take two tablets by mouth twice daily 2)  Aspirin 81 Mg Tabs (Aspirin) .... Take 1 tablet by mouth once a day 3)  Norvasc 10 Mg Tabs (Amlodipine besylate) .... Take 1 tablet by mouth once a day 4)  Hydrochlorothiazide 12.5 Mg Tabs (Hydrochlorothiazide) .... Take 1 tablet by mouth once a day 5)  Aleve 220 Mg Caps (Naproxen sodium) .... Take one tab every six hours as needed 6)  Lancets Misc (Lancets) .... Once daily testing 7)  Prodigy Autocode Blood Glucose W/device Kit (Blood glucose monitoring suppl) .... Uad once daily testing 8)  Prodigy Blood Glucose Test Strp (Glucose blood) .... Uad once daily testing 9)  Prodigy Twist Top Lancets 28g Misc (Lancets) .... Uad once daily testing 10)  Betamethasone Dipropionate 0.05 % Crea (Betamethasone dipropionate) .... Apply twice daily to affectedareas for 2 weeks then as needed 11)  Flomax 0.4 Mg Caps (Tamsulosin hcl) .... Take 1 capsule by mouth once a day 12)  Lovastatin 40 Mg Tabs (Lovastatin) .... Take 1 tab by mouth at bedtime 13)  Glipizide 10 Mg Tabs (Glipizide) .... Take 1 tablet by mouth two times a day 14)  Onglyza 5 Mg Tabs (Saxagliptin hcl) .... Take 1 tablet by mouth once a day  Other Orders: T-Hepatic Function [phone]) T-Lipid Profile [phone]) T-CBC w/Diff [phone]) T-TSH  [id number])  Patient Instructions: 1)  Please schedule a follow-up appointment in 3.5 months.(Same date  as his brother) 2)  You will be reffered to ortho, Drv Allusio about your knee , right and right hip 3)  BMP prior to visit, ICD-9: 4)  Hepatic Panel prior to visit, ICD-9: 5)  Lipid Panel prior to visit, ICD-9:  fasting today 6)  TSH prior to visit, ICD-9: 7)  CBC w/ Diff prior to visit, ICD-9: 8)  HbgA1C prior to visit, ICD-9: 9)  CBC and diff 10)  Injection today for right hip pain Prescriptions: GLIPIZIDE 10 MG TABS (GLIPIZIDE) Take 1 tablet by mouth two times a day  #60 x 3   Entered by:   Everitt Amber LPN   Authorized by:   Syliva Overman MD   Signed by:   Everitt Amber LPN on 28/41/3244   Method used:   Electronically to        [facility]* (retail)       [address]       [place], [place]  [address]       Ph: [phone]       Fax: [phone]   RxID:   [id number] LOVASTATIN 40 MG TABS (LOVASTATIN) Take 1 tab by mouth at bedtime  #30 x 3   Entered by:   Everitt Amber LPN   Authorized by:   Syliva Overman MD   Signed by:   Everitt Amber LPN on 63/11/6008   Method used:   Electronically to        [facility]* (retail)       [address]       [place], [place]  [address]       Ph: [phone]       Fax: [phone]   RxID:   [id number] FLOMAX 0.4 MG CAPS (TAMSULOSIN HCL) Take 1 capsule by mouth once a day  #30 x 3   Entered by:   Everitt Amber LPN   Authorized by:   Syliva Overman MD   Signed by:  Everitt Amber LPN on 78/29/5621   Method used:   Electronically to        [facility]* (retail)       [address]       [place], [place]  [address]       Ph: [phone]       Fax: [phone]   RxID:   [id number] HYDROCHLOROTHIAZIDE 12.5 MG  TABS (HYDROCHLOROTHIAZIDE) Take 1 tablet by mouth once a day  #30 x 3   Entered  by:   Everitt Amber LPN   Authorized by:   Syliva Overman MD   Signed by:   Everitt Amber LPN on 95/63/8756   Method used:   Electronically to        [facility]* (retail)       [address]       [place], [place]  [address]       Ph: [phone]       Fax: [phone]   RxID:   [id number] NORVASC 10 MG  TABS (AMLODIPINE BESYLATE) Take 1 tablet by mouth once a day  #30 x 3   Entered by:   Everitt Amber LPN   Authorized by:   Syliva Overman MD   Signed by:   Everitt Amber LPN on 37/62/8315   Method used:   Electronically to        [facility]* (retail)       [address]       [place], [place]  [address]       Ph: [phone]       Fax: [phone]   RxID:   [id number] METFORMIN HCL 500 MG  TABS (METFORMIN HCL) Take two tablets by mouth twice daily  #120 x 3   Entered by:   Everitt Amber LPN   Authorized by:   Syliva Overman MD   Signed by:   Everitt Amber LPN on 78/93/8101   Method used:   Electronically to        [facility]* (retail)       [address]       [place], [place]  [address]       Ph: [phone]       Fax: [phone]   RxID:   [id number] ONGLYZA 5 MG TABS (SAXAGLIPTIN HCL) Take 1 tablet by mouth once a day  #5boxes x 0   Entered by:   Everitt Amber LPN   Authorized by:   Syliva Overman MD   Signed by:   Everitt Amber LPN on 12/45/8099   Method used:   Samples Given   RxID:   [id number]    Medication Administration  Injection # 1:    Medication: Ketorolac-Toradol 15mg     Diagnosis: HIP PAIN, RIGHT (ICD-719.45)    Route: IM    Site: LUOQ gluteus    Exp Date: 04/2012    Lot #: [id number]     Mfr: [email or web address]    Comments: 60mg  given     Patient tolerated injection without complications    Given by: Everitt Amber LPN (December 11, 2010 11:13 AM)  Orders Added: 1)  Est. Patient Level IV [93790] 2)  Medicare  Electronic Prescription [G8553] 3)  EKG w/ Interpretation [93000] 4)  T-CMP with estimated GFR [[address]]  5)  T-Hepatic Function [80076-22960] 6)  T-Lipid Profile [80061-22930] 7)  T-CBC w/Diff [16109-60454] 8)  T- Hemoglobin A1C [83036-23375] 9)  T-TSH [09811-91478] 10)  Orthopedic Referral [Ortho] 11)  Ketorolac-Toradol 15mg  [J1885] 12)  Admin of Therapeutic Inj  intramuscular or subcutaneous [96372]     Medication Administration  Injection # 1:    Medication: Ketorolac-Toradol 15mg     Diagnosis: HIP PAIN, RIGHT (ICD-719.45)    Route: IM    Site: LUOQ gluteus    Exp Date: 04/2012    Lot #: [id number]     Mfr: [email or web address]    Comments: 60mg  given     Patient tolerated injection without complications    Given by: Everitt Amber LPN (December 11, 2010 11:13 AM)  Orders Added: 1)  Est. Patient Level IV [29562] 2)  Medicare Electronic Prescription [G8553] 3)  EKG w/ Interpretation [93000] 4)  T-CMP with estimated GFR [[address]] 5)  T-Hepatic Function [80076-22960] 6)  T-Lipid Profile [80061-22930] 7)  T-CBC w/Diff [13086-57846] 8)  T- Hemoglobin A1C [83036-23375] 9)  T-TSH [96295-28413] 10)  Orthopedic Referral [Ortho] 11)  Ketorolac-Toradol 15mg  [J1885] 12)  Admin of Therapeutic Inj  intramuscular or subcutaneous [24401]

## 2010-12-31 NOTE — Miscellaneous (Signed)
Summary: Home Care Report  Home Care Report   Imported By: Lind Guest 12/11/2010 11:52:36  _____________________________________________________________________  External Attachment:    Type:   Image     Comment:   External Document

## 2011-01-06 NOTE — Letter (Signed)
Summary: fmla papers for son  fmla papers for son   Imported By: Lind Guest 12/29/2010 13:32:38  _____________________________________________________________________  External Attachment:    Type:   Image     Comment:   External Document

## 2011-02-08 ENCOUNTER — Other Ambulatory Visit: Payer: Self-pay | Admitting: Orthopedic Surgery

## 2011-02-08 ENCOUNTER — Encounter (HOSPITAL_COMMUNITY): Payer: Medicare Other

## 2011-02-08 ENCOUNTER — Other Ambulatory Visit (HOSPITAL_COMMUNITY): Payer: Self-pay | Admitting: Orthopedic Surgery

## 2011-02-08 ENCOUNTER — Ambulatory Visit (HOSPITAL_COMMUNITY)
Admission: RE | Admit: 2011-02-08 | Discharge: 2011-02-08 | Disposition: A | Discharge status: Home / Self Care | Payer: Medicare Other | Source: Ambulatory Visit | Attending: Orthopedic Surgery | Admitting: Orthopedic Surgery

## 2011-02-08 DIAGNOSIS — J984 Other disorders of lung: Secondary | ICD-10-CM | POA: Insufficient documentation

## 2011-02-08 DIAGNOSIS — Z01811 Encounter for preprocedural respiratory examination: Secondary | ICD-10-CM

## 2011-02-08 DIAGNOSIS — Z01810 Encounter for preprocedural cardiovascular examination: Secondary | ICD-10-CM | POA: Insufficient documentation

## 2011-02-08 DIAGNOSIS — Z01812 Encounter for preprocedural laboratory examination: Secondary | ICD-10-CM | POA: Insufficient documentation

## 2011-02-08 DIAGNOSIS — I517 Cardiomegaly: Secondary | ICD-10-CM | POA: Insufficient documentation

## 2011-02-08 DIAGNOSIS — Z01818 Encounter for other preprocedural examination: Secondary | ICD-10-CM | POA: Insufficient documentation

## 2011-02-08 LAB — DIFFERENTIAL
Basophils Absolute: 0 10*3/uL (ref 0.0–0.1)
Basophils Relative: 0 % (ref 0–1)
Eosinophils Relative: 5 % (ref 0–5)
Monocytes Absolute: 0.5 10*3/uL (ref 0.1–1.0)
Neutro Abs: 2.8 10*3/uL (ref 1.7–7.7)

## 2011-02-08 LAB — BASIC METABOLIC PANEL
BUN: 14 mg/dL (ref 6–23)
Calcium: 9.1 mg/dL (ref 8.4–10.5)
GFR calc non Af Amer: 50 mL/min — ABNORMAL LOW (ref >60)
Glucose, Bld: 88 mg/dL (ref 70–99)

## 2011-02-08 LAB — URINALYSIS, ROUTINE W REFLEX MICROSCOPIC
Nitrite: NEGATIVE
Specific Gravity, Urine: 1.028 (ref 1.005–1.030)
Urobilinogen, UA: 1 mg/dL (ref 0.0–1.0)

## 2011-02-08 LAB — CBC
Hemoglobin: 12.5 g/dL — ABNORMAL LOW (ref 13.0–17.0)
MCHC: 32.3 g/dL (ref 30.0–36.0)
RDW: 13.8 % (ref 11.5–15.5)

## 2011-02-08 LAB — APTT: aPTT: 36 seconds (ref 24–37)

## 2011-02-08 LAB — PROTIME-INR: Prothrombin Time: 13.6 seconds (ref 11.6–15.2)

## 2011-02-11 NOTE — H&P (Signed)
  NAME:  Samuel Ryan, DOIG NO.:  [id number]  MEDICAL RECORD NO.:  [id number]          PATIENT TYPE:  LOCATION:                                 FACILITY:  PHYSICIAN:  Madlyn Frankel. Charlann Boxer, M.D.  DATE OF BIRTH:  November 22, 1931  DATE OF ADMISSION: DATE OF DISCHARGE:                             HISTORY & PHYSICAL   ADMISSION DIAGNOSIS:  Right knee osteoarthritis.  HISTORY OF PRESENT ILLNESS:  This is a 77 year old gentleman with history of end-stage osteoarthritis of his right knee, who has failed conservative treatment.  After discussion of treatment, benefits, risks, and options, the patient now scheduled for total knee arthroplasty of the right knee.  DRUG ALLERGIES:  None.  CURRENT MEDICATIONS: 1. Metformin 500 mg b.i.d. 2. Lovastatin 40 mg nightly. 3. Amlodipine 10 mg daily. 4. Hydrochlorothiazide, dosage unsure. 5. Tamsulosin 0.4 mg daily. 6. Glipizide 10 mg b.i.d. 7. Onglyza 5 mg b.i.d.  MEDICAL ILLNESSES:  Include, diabetes, hypertension, hyperlipidemia, BPH.  PREVIOUS SURGERIES:  Include, herniorrhaphy.  SOCIAL HISTORY:  The patient is married.  He does not smoke and does not drink.  FAMILY HISTORY:  Noncontributory.  REVIEW OF SYSTEMS:  CENTRAL NERVOUS SYSTEM:  Positive for history of mild early Alzheimer's.  Negative for headache, blurred vision, or dizziness.  PULMONARY:  Negative for shortness of breath, PND, and orthopnea.  CARDIOVASCULAR:  Negative for chest pain or palpitations. GI:  Positive for history of hiatal hernia.  GU:  Positive for prostate disease.  MUSCULOSKELETAL:  Positive in HPI.  PHYSICAL EXAM:  VITAL SIGNS:  Blood pressure 130/68, respirations 16, pulse 80 and regular. GENERAL APPEARANCE:  He is a well-developed, well-nourished gentleman, in no acute distress. HEENT:  Head normocephalic.  Nose patent.  Ears patent.  Pupils are equal, round, and reactive to light.  Throat without injection. NECK:  Supple without adenopathy.   Carotids are 2+ without bruits. CHEST:  Clear to auscultation.  No rales or rhonchi.  Respirations 16. HEART:  Regular rate and rhythm and 80 beats per minute without murmur. ABDOMEN:  Soft with active bowel sounds.  No masses or organomegaly. NEUROLOGIC:  The patient is alert and oriented to time, place, and person.  Cranial nerves II through XII grossly intact.  Extremity shows the right knee with a 5-degree flexion contracture with further flexion to 120 degrees.  Neurovascular status is intact.  IMPRESSION:  Right knee osteoarthritis.  PLAN:  Total knee arthroplasty, right knee.  The patient is to receive tranexamic acid in the preoperative holding room per protocol.     Jaquelyn Bitter. Chabon, P.A.   ______________________________ Madlyn Frankel Charlann Boxer, M.D.    SJC/MEDQ  D:  02/03/2011  T:  02/04/2011  Job:  [id number]  cc:   Madlyn Frankel Charlann Boxer, M.D. Fax: [phone]  Electronically Signed by Jodene Nam P.A. on 02/07/2011 08:09:23 AM Electronically Signed by Durene Romans M.D. on 02/11/2011 12:11:59 PM

## 2011-02-15 ENCOUNTER — Inpatient Hospital Stay (HOSPITAL_COMMUNITY): Admission: RE | Admit: 2011-02-15 | Payer: Medicare Other | Source: Ambulatory Visit | Admitting: Orthopedic Surgery

## 2011-02-15 ENCOUNTER — Telehealth: Payer: Self-pay | Admitting: *Deleted

## 2011-02-16 ENCOUNTER — Telehealth: Payer: Self-pay | Admitting: Family Medicine

## 2011-02-16 NOTE — Telephone Encounter (Signed)
Pitting 2+ edema to lower legs and feet. It is going down some with elevation. Started Sunday. Vitals okay. Spoke with home health nurse Miranda.

## 2011-02-17 NOTE — Telephone Encounter (Signed)
Advise nurse visit in am for lasix IM , I will let you know the dose when he gets here, and to start as needed lasix at home

## 2011-02-17 NOTE — Telephone Encounter (Signed)
What should I tell Mr Gatley?

## 2011-02-18 ENCOUNTER — Ambulatory Visit (INDEPENDENT_AMBULATORY_CARE_PROVIDER_SITE_OTHER): Payer: Medicare Other

## 2011-02-18 VITALS — BP 120/82 | HR 77 | Resp 16 | Wt 246.0 lb

## 2011-02-18 DIAGNOSIS — R609 Edema, unspecified: Secondary | ICD-10-CM

## 2011-02-18 MED ORDER — FUROSEMIDE 10 MG/ML IJ SOLN: 20.0000 mg | Freq: Once | INTRAMUSCULAR | Status: DC

## 2011-02-18 MED ORDER — FUROSEMIDE 10 MG/ML IJ SOLN
20.0000 mg | Freq: Once | INTRAMUSCULAR | Status: AC
Start: 2011-02-18 — End: 2011-02-18
  Administered 2011-02-18: 10 mg via INTRAMUSCULAR

## 2011-02-18 NOTE — Progress Notes (Signed)
Patient had wound packing in upper right shoulder, removed packing and replaced with 4x4 guaze soaked in normal saline and covered with 4x4 and paper tape.

## 2011-02-25 NOTE — Progress Notes (Signed)
Summary: onglayza 5 mg samples  Phone Note Call from Patient   Summary of Call: patient called in and would like to know if we have samples for him and his brother Zayed Griffie for their onglayza 5 mg.  Please advise his number is [phone] Initial call taken by: Curtis Sites,  February 15, 2011 8:36 AM  Follow-up for Phone Call        patient aware we have 2 boxes for pickup Follow-up by: Everitt Amber LPN,  February 15, 2011 12:50 PM    New/Updated Medications: ONGLYZA 5 MG TABS (SAXAGLIPTIN HCL) Take 1 tablet by mouth once a day Prescriptions: ONGLYZA 5 MG TABS (SAXAGLIPTIN HCL) Take 1 tablet by mouth once a day  #28 tabs x 0   Entered by:   Everitt Amber LPN   Authorized by:   Syliva Overman MD   Signed by:   Everitt Amber LPN on 45/40/9811   Method used:   Samples Given   RxID:   [id number]

## 2011-03-04 ENCOUNTER — Telehealth: Payer: Self-pay | Admitting: Family Medicine

## 2011-03-08 NOTE — Telephone Encounter (Signed)
Will call patient when samples are available

## 2011-03-18 ENCOUNTER — Other Ambulatory Visit: Payer: Self-pay

## 2011-03-18 DIAGNOSIS — E119 Type 2 diabetes mellitus without complications: Secondary | ICD-10-CM

## 2011-03-18 MED ORDER — SAXAGLIPTIN HCL 5 MG PO TABS: 5.0000 mg | ORAL_TABLET | Freq: Every day | ORAL | Status: DC

## 2011-03-22 ENCOUNTER — Telehealth: Payer: Self-pay | Admitting: Family Medicine

## 2011-03-23 ENCOUNTER — Other Ambulatory Visit: Payer: Self-pay

## 2011-03-23 DIAGNOSIS — E119 Type 2 diabetes mellitus without complications: Secondary | ICD-10-CM

## 2011-03-23 MED ORDER — SAXAGLIPTIN HCL 5 MG PO TABS: 5.0000 mg | ORAL_TABLET | Freq: Every day | ORAL | Status: DC

## 2011-03-23 NOTE — Telephone Encounter (Signed)
RX sent to [facility]

## 2011-03-25 ENCOUNTER — Other Ambulatory Visit: Payer: Self-pay

## 2011-03-25 DIAGNOSIS — E119 Type 2 diabetes mellitus without complications: Secondary | ICD-10-CM

## 2011-03-25 MED ORDER — GLIPIZIDE 10 MG PO TABS: 10.0000 mg | ORAL_TABLET | Freq: Two times a day (BID) | ORAL | Status: DC

## 2011-04-14 ENCOUNTER — Other Ambulatory Visit: Payer: Self-pay | Admitting: Orthopedic Surgery

## 2011-04-14 ENCOUNTER — Other Ambulatory Visit (HOSPITAL_COMMUNITY): Payer: Medicare Other

## 2011-04-14 ENCOUNTER — Encounter (HOSPITAL_COMMUNITY): Payer: Medicare Other

## 2011-04-14 LAB — DIFFERENTIAL
Lymphs Abs: 1.4 10*3/uL (ref 0.7–4.0)
Monocytes Relative: 10 % (ref 3–12)
Neutro Abs: 2.1 10*3/uL (ref 1.7–7.7)
Neutrophils Relative %: 50 % (ref 43–77)

## 2011-04-14 LAB — APTT: aPTT: 31 seconds (ref 24–37)

## 2011-04-14 LAB — URINALYSIS, ROUTINE W REFLEX MICROSCOPIC
Bilirubin Urine: NEGATIVE
Glucose, UA: NEGATIVE mg/dL
Ketones, ur: NEGATIVE mg/dL
pH: 5.5 (ref 5.0–8.0)

## 2011-04-14 LAB — CBC
HCT: 37.6 % — ABNORMAL LOW (ref 39.0–52.0)
Hemoglobin: 12.4 g/dL — ABNORMAL LOW (ref 13.0–17.0)
MCH: 29.4 pg (ref 26.0–34.0)
MCV: 89.1 fL (ref 78.0–100.0)
RBC: 4.22 MIL/uL (ref 4.22–5.81)

## 2011-04-14 LAB — BASIC METABOLIC PANEL
Chloride: 100 mEq/L (ref 96–112)
Creatinine, Ser: 1.16 mg/dL (ref 0.4–1.5)
GFR calc Af Amer: >60 mL/min (ref >60)
Potassium: 3.9 mEq/L (ref 3.5–5.1)

## 2011-04-16 NOTE — Procedures (Signed)
NAME:  Samuel Ryan, Samuel Ryan NO.:  [id number]   MEDICAL RECORD NO.:  [id number]                   PATIENT TYPE:  OUT   LOCATION:  RESP                                 FACILITY:  [facility]   PHYSICIAN:  Vida Roller, M.D.                DATE OF BIRTH:  1931/05/29   DATE OF PROCEDURE:  01/30/2004  DATE OF DISCHARGE:  01/24/2004                                  ECHOCARDIOGRAM   REFERRING PHYSICIAN:  Dr. Syliva Overman.   Tape #[id number], tape count [id number].   This is a 77 year old man with chest pain.  Technical quality of the study  is adequate.   M-MODE TRACINGS:  1. The aorta is 40 mm.  2. The left atrium is 47 mm.  3. The septum is 20 mm.  4. Left ventricular posterior wall is 16 mm.  5. Left ventricular diastolic dimension is 43 mm.  6. Left ventricular systolic dimension is 25 mm.   2-D AND DOPPLER IMAGING:  1. The left ventricle is normal sized with relatively marked concentric left     ventricular hypertrophy.  The septum has some predominance of that     hypertrophy, and there are elevated left ventricular outflow velocities     with what appears to be a normally functioning aortic valve, so I suspect     there is a mild gradient across the LV outflow tract.  The overall     ejection fraction is hyperdynamic, with an estimated ejection fraction at     75-80%, and this may be transient due to the LVH and the hyperdynamic     ventricle.  2. The right ventricle is normal sized with normal systolic function.  3. Both atria are enlarged, left greater than right.  There is no obvious     atrial septal defect.  4. The aortic valve is morphologically unremarkable with only very mild     calcific changes.  There is no insufficiency seen.  There is also likely     no significant stenosis.  The overall gradient across the LV outflow     tract is only 18 mmHg.  5. The mitral valve has mild annular calcification with mild regurgitation.  6. The tricuspid valve  has mild regurgitation.  7. The pulmonic valve is not well seen.  8. The pericardial structures appear normal.  9. The inferior vena cava is very small.  10.      The ascending aorta is not well seen.   SUMMARY:  1. Hyperdynamic left ventricular function.  2. Moderate to severe left ventricular hypertrophy.  3. A 15- to 18-mm left ventricular outflow gradient secondary to asymmetric     septal hypertrophy.      ___________________________________________  Vida Roller, M.D.   JH/MEDQ  D:  01/30/2004  T:  01/30/2004  Job:  [id number]

## 2011-04-16 NOTE — Procedures (Signed)
NAME:  Samuel Ryan, Samuel Ryan NO.:  [id number]   MEDICAL RECORD NO.:  [id number]                   PATIENT TYPE:  OUT   LOCATION:  RESP                                 FACILITY:  [facility]   PHYSICIAN:  Edward L. Juanetta Gosling, M.D.             DATE OF BIRTH:  [birth year]   DATE OF PROCEDURE:  DATE OF DISCHARGE:  01/24/2004                              PULMONARY FUNCTION TEST   1. Spirometry shows a moderate ventilatory defect without definite air low     obstruction except as seen in the small airway area.  2. There is restrictive change which is not of quite the magnitude as the     ventilatory defect.  3. DLCO is moderately reduced.  4. Arterial blood gas shows relative resting hypoxemia.  5. There is borderline significant response to inhaled bronchodilator.  6. Please note the technician's comment about the patient having difficulty     with the spirometry maneuver.       ___________________________________________                                            Oneal Deputy. Juanetta Gosling, M.D.   Gwenlyn Found  D:  01/26/2004  T:  01/26/2004  Job:  [id number]

## 2011-04-19 NOTE — H&P (Signed)
NAME:  Samuel Ryan, Samuel Ryan NO.:  [id number]  MEDICAL RECORD NO.:  [id number]          PATIENT TYPE:  LOCATION:                                 FACILITY:  PHYSICIAN:  Madlyn Frankel. Charlann Boxer, M.D.  DATE OF BIRTH:  1931-10-03  DATE OF ADMISSION: DATE OF DISCHARGE:                             HISTORY & PHYSICAL   ADMISSION DIAGNOSIS:  End-stage osteoarthritis, right knee.  HISTORY OF PRESENT ILLNESS:  This is a 77 year old gentleman with a history of end-stage osteoarthritis of his right knee with failure to conservative treatment to eliminate his symptoms.  After discussion of treatments, benefits, risks and options, the patient now scheduled for total knee arthroplasty.  He was scheduled to have this earlier in the ER, however at that time he had an infected sebaceous cyst.  He has had treated that is healed up now and we will proceed with surgery.  He is a candidate for tranexamic acid and will receive that at surgery time and he is given his home medicines of aspirin 325 b.i.d., Robaxin 500 q.6 p.r.n. as well as iron, MiraLax and Colace.  PAST MEDICAL HISTORY:  DRUG ALLERGIES:  None.  CURRENT MEDICATIONS: 1. Metformin 500 mg 2 b.i.d. 2. Lovastatin 40 mg nightly. 3. Amlodipine 10 mg daily. 4. Hydrochlorothiazide 12.5 mg 1 daily. 5. Tamsulosin 0.4 mg daily. 6. Glipizide 10 mg b.i.d. 7. Onglyza 5 mg b.i.d. 8. Finasteride 5 mg daily.  Serious medical illnesses include diabetes, hypertension, hyperlipidemia and BPH.  Previous surgeries include herniorrhaphy and I and D of sebaceous cyst.  SOCIAL HISTORY:  The patient is married.  He lives at home.  He does not smoke and drink.  FAMILY HISTORY:  Noncontributory.  REVIEW OF SYSTEMS:  CENTRAL NERVOUS SYSTEM:  Positive for early mild Alzheimer's.  Negative for headache, blurred vision or dizziness. PULMONARY:  No shortness of breath, PND or orthopnea.  CARDIOVASCULAR: Negative for chest pain or palpitation.  GI:   Negative for ulcers, hepatitis.  GU:  Negative for urinary difficulties other than BPH. MUSCULOSKELETAL:  Positive in HPI.  PHYSICAL EXAMINATION:  VITAL SIGNS:  BP 130/78, respirations 16, pulse 68 and regular. GENERAL APPEARANCE:  This is a well-developed, well-nourished gentleman in no acute distress. HEENT:  Head normocephalic.  Nose patent.  Ears patent.  Pupils are equal, round and reactive to light.  Throat without injection. NECK:  Supple without adenopathy.  Carotids 2+ without bruit. CHEST:  Clear to auscultation.  No rales or rhonchi.  Respirations 16. HEART:  Regular rate and rhythm at 68 beats per minute without murmur. ABDOMEN:  Soft.  Active bowel sounds.  No masses, organomegaly. NEUROLOGIC:  The patient is alert and oriented to time, place and person.  Cranial nerves II-XII grossly intact. EXTREMITIES:  Right leg with mild edema.  No calf tenderness.  No cords. Negative Homans sign. NEUROVASCULAR:  Neurovascular status intact.  Range of motion 3-115 degrees.  X-rays show osteoarthritis, right knee.  IMPRESSION:  Osteoarthritis, right knee.  PLAN:  Total knee arthroplasty, right knee.     Jaquelyn Bitter. Chabon, P.A.   ______________________________ Madlyn Frankel Charlann Boxer, M.D.  SJC/MEDQ  D:  04/14/2011  T:  04/14/2011  Job:  [id number]  Electronically Signed by Jodene Nam P.A. on 04/19/2011 12:41:35 PM Electronically Signed by Durene Romans M.D. on 04/19/2011 01:04:41 PM

## 2011-04-22 ENCOUNTER — Ambulatory Visit: Payer: Self-pay | Admitting: Family Medicine

## 2011-04-23 ENCOUNTER — Inpatient Hospital Stay (HOSPITAL_COMMUNITY)
Admission: RE | Admit: 2011-04-23 | Discharge: 2011-04-30 | DRG: 470 | Disposition: A | Discharge status: Skilled Nursing Facility | Payer: Medicare Other | Source: Ambulatory Visit | Attending: Orthopedic Surgery | Admitting: Orthopedic Surgery

## 2011-04-23 DIAGNOSIS — E785 Hyperlipidemia, unspecified: Secondary | ICD-10-CM | POA: Diagnosis present

## 2011-04-23 DIAGNOSIS — N138 Other obstructive and reflux uropathy: Secondary | ICD-10-CM | POA: Diagnosis not present

## 2011-04-23 DIAGNOSIS — E871 Hypo-osmolality and hyponatremia: Secondary | ICD-10-CM | POA: Diagnosis not present

## 2011-04-23 DIAGNOSIS — R944 Abnormal results of kidney function studies: Secondary | ICD-10-CM | POA: Diagnosis not present

## 2011-04-23 DIAGNOSIS — R339 Retention of urine, unspecified: Secondary | ICD-10-CM | POA: Diagnosis not present

## 2011-04-23 DIAGNOSIS — M1710 Unilateral primary osteoarthritis, unspecified knee: Principal | ICD-10-CM | POA: Diagnosis present

## 2011-04-23 DIAGNOSIS — Z01812 Encounter for preprocedural laboratory examination: Secondary | ICD-10-CM

## 2011-04-23 DIAGNOSIS — E669 Obesity, unspecified: Secondary | ICD-10-CM | POA: Diagnosis present

## 2011-04-23 DIAGNOSIS — D62 Acute posthemorrhagic anemia: Secondary | ICD-10-CM | POA: Diagnosis not present

## 2011-04-23 DIAGNOSIS — N401 Enlarged prostate with lower urinary tract symptoms: Secondary | ICD-10-CM | POA: Diagnosis not present

## 2011-04-23 DIAGNOSIS — E119 Type 2 diabetes mellitus without complications: Secondary | ICD-10-CM | POA: Diagnosis present

## 2011-04-23 HISTORY — PX: OTHER SURGICAL HISTORY: SHX169

## 2011-04-23 LAB — GLUCOSE, CAPILLARY: Glucose-Capillary: 138 mg/dL — ABNORMAL HIGH (ref 70–99)

## 2011-04-23 LAB — TYPE AND SCREEN

## 2011-04-23 LAB — ABO/RH: ABO/RH(D): A POS

## 2011-04-24 LAB — CBC
Hemoglobin: 10.7 g/dL — ABNORMAL LOW (ref 13.0–17.0)
MCH: 29.4 pg (ref 26.0–34.0)
MCV: 88.7 fL (ref 78.0–100.0)
Platelets: 161 10*3/uL (ref 150–400)
RBC: 3.64 MIL/uL — ABNORMAL LOW (ref 4.22–5.81)

## 2011-04-24 LAB — BASIC METABOLIC PANEL
BUN: 9 mg/dL (ref 6–23)
CO2: 29 mEq/L (ref 19–32)
Chloride: 101 mEq/L (ref 96–112)
Creatinine, Ser: 1.07 mg/dL (ref 0.4–1.5)

## 2011-04-24 LAB — GLUCOSE, CAPILLARY

## 2011-04-25 LAB — CBC
Hemoglobin: 11 g/dL — ABNORMAL LOW (ref 13.0–17.0)
RBC: 3.8 MIL/uL — ABNORMAL LOW (ref 4.22–5.81)
WBC: 10.7 10*3/uL — ABNORMAL HIGH (ref 4.0–10.5)

## 2011-04-25 LAB — GLUCOSE, CAPILLARY
Glucose-Capillary: 158 mg/dL — ABNORMAL HIGH (ref 70–99)
Glucose-Capillary: 157 mg/dL — ABNORMAL HIGH (ref 70–99)

## 2011-04-25 LAB — BASIC METABOLIC PANEL
CO2: 25 mEq/L (ref 19–32)
Chloride: 96 mEq/L (ref 96–112)
GFR calc Af Amer: >60 mL/min (ref >60)
Sodium: 132 mEq/L — ABNORMAL LOW (ref 135–145)

## 2011-04-26 LAB — BASIC METABOLIC PANEL
CO2: 23 mEq/L (ref 19–32)
Calcium: 8 mg/dL — ABNORMAL LOW (ref 8.4–10.5)
Creatinine, Ser: 2.11 mg/dL — ABNORMAL HIGH (ref 0.4–1.5)
GFR calc Af Amer: 37 mL/min — ABNORMAL LOW (ref >60)

## 2011-04-26 LAB — GLUCOSE, CAPILLARY: Glucose-Capillary: 153 mg/dL — ABNORMAL HIGH (ref 70–99)

## 2011-04-27 LAB — GLUCOSE, CAPILLARY
Glucose-Capillary: 169 mg/dL — ABNORMAL HIGH (ref 70–99)
Glucose-Capillary: 173 mg/dL — ABNORMAL HIGH (ref 70–99)
Glucose-Capillary: 142 mg/dL — ABNORMAL HIGH (ref 70–99)

## 2011-04-27 LAB — BASIC METABOLIC PANEL
CO2: 25 mEq/L (ref 19–32)
Calcium: 8.1 mg/dL — ABNORMAL LOW (ref 8.4–10.5)
GFR calc Af Amer: 31 mL/min — ABNORMAL LOW (ref >60)
GFR calc non Af Amer: 25 mL/min — ABNORMAL LOW (ref >60)
Sodium: 136 mEq/L (ref 135–145)

## 2011-04-27 LAB — HEMOGLOBIN AND HEMATOCRIT, BLOOD
HCT: 27 % — ABNORMAL LOW (ref 39.0–52.0)
Hemoglobin: 9.4 g/dL — ABNORMAL LOW (ref 13.0–17.0)

## 2011-04-27 NOTE — Op Note (Signed)
NAME:  Samuel Ryan, Samuel Ryan NO.:  [id number]  MEDICAL RECORD NO.:  [id number]           PATIENT TYPE:  I  LOCATION:  1619                         FACILITY:  [facility]  PHYSICIAN:  Madlyn Frankel. Charlann Boxer, M.D.  DATE OF BIRTH:  [birth year]  DATE OF PROCEDURE:  04/23/2011 DATE OF DISCHARGE:                              OPERATIVE REPORT   PREOPERATIVE DIAGNOSIS:  Right knee osteoarthritis.  POSTOPERATIVE DIAGNOSES: 1. Right knee osteoarthritis. 2. Diabetes.  PROCEDURE:  Right total knee replacement.  COMPONENTS USED:  DePuy rotating platform posterior stabilized knee system, size five femur, five tibia, 10-mm insert and the 41 patellar button.  SURGEON:  Madlyn Frankel. Charlann Boxer, M.D.  ASSISTANT:  Surgical team.  ANESTHESIA:  Spinal.  SPECIMENS:  None.  COMPLICATIONS:  None.  DRAINS:  One Hemovac drain.  TOURNIQUET TIME:  47 minutes 250 mmHg.  INDICATION FOR THE PROCEDURE:  Mr. Lawless is a 77 year old gentleman, who presented to the office for advanced right knee osteoarthritis, pain that effect his overall quality of life.  Radiographs have revealed advanced degenerative changes.  After reviewing risks of infection, DVT, component failure, need for revision surgery for any reason, the postop course and expectations particularly in the setting of diabetes, consent was obtained for the benefit of pain.  PROCEDURE IN DETAIL:  Patient was brought to the operative theater. Once adequate anesthesia and preoperative antibiotics, Ancef administered 2 grams, she was positioned supine.  Right thigh tourniquet was placed.  The right lower extremity was then prepped and draped in a sterile fashion with the right leg placed in the Mayo leg holder.  Time- out was performed identifying the patient, planned procedure and extremity.  Leg was exsanguinated, tourniquet elevated to 250 mmHg.  A midline incision was made followed by median arthrotomy.  Following initial exposure  synovectomy, attention was directed to the patella. Precut measurement was 26 mm.  I resected down to about 14 mm and used a 41 patellar button to restore height.  Lug holes were drilled and a metal shim placed to protect the patella from retractors and saw blades.  Attention was now directed to the femur.  Femoral canal was opened with a drill, irrigated to try to prevent fat emboli.  An intramedullary rod was passed and 5 degrees of valgus 11 mm of bone resected off the distal femur to incise the femur concern for potential need for size six femur, preoperative flexion contraction.  Following this cut, I incised the femur just to get an idea and was going to be a size five.  At this point, the tibia was subluxated anteriorly and using the extramedullary jig, I resected the measured resection about 8 mm off the proximal lateral tibia.  Following this resection, we confirmed the gap in the stable medial and lateral with 10 mm insert and that the cut was perpendicular in the coronal plane.  At this point, I resized the femur, confirmed it was to be a size five,the size five rotation block was then pinned, anterior reference off of the set rotation off the proximal tibia was pinned into position and then exchange  for the former cutting block.  Anterior and posterior chamfer cuts were then made without difficulty, no notching.  Final box cut was made off the lateral aspect of distal femur.  The tibia was then subluxated anteriorly and using a size five tibial tray was pinned into position through the medial third of tubercle, drilled and keel punch.  Trial reduction was now carried out with five femur, five tibia and 10 mm insert.  The patella tracked through the trochlea without application of pressure.  Given these findings, the trial components were removed, the synovial capsule junction of the knee was then injected with 0.25% Marcaine with epinephrine and 1 mL of Toradol to 61 mL.   Final components were opened including the polyethylene insert and cement mixed.  The knee was irrigated with normal saline solution and pulse lavaged.  The final components were then cemented onto clean and dried cut surfaces of the bone.  The knee was brought to extension with 10 mm insert.  The extruded cement was removed.  Once the cement had fully cured, excessive cement was removed from the knee.  The final 10-mm insert was chosen.  The tourniquet had been let down at 47 minutes without significant hemostasis required.  The knee was re-irrigated with normal saline solution.  A medium Hemovac drain was placed deep.  Extensor mechanism was then reapproximated using #1 Vicryl and the knee in flexion.  The remainder of the wound was closed with 2-0 Vicryl running 4-0 Monocryl. The knee was cleaned, dried and dressed sterilely using Dermabond and Aquacel dressing.  She was then brought to recovery room in stable condition tolerating the procedure well with an Ace wrap in place.     Madlyn Frankel Charlann Boxer, M.D.     MDO/MEDQ  D:  04/23/2011  T:  04/23/2011  Job:  [id number]  Electronically Signed by Durene Romans M.D. on 04/27/2011 08:57:08 AM

## 2011-04-28 LAB — BASIC METABOLIC PANEL
BUN: 48 mg/dL — ABNORMAL HIGH (ref 6–23)
CO2: 24 mEq/L (ref 19–32)
Calcium: 8.1 mg/dL — ABNORMAL LOW (ref 8.4–10.5)
Calcium: 8.8 mg/dL (ref 8.4–10.5)
GFR calc Af Amer: 25 mL/min — ABNORMAL LOW (ref >60)
GFR calc non Af Amer: 21 mL/min — ABNORMAL LOW (ref >60)
GFR calc non Af Amer: 22 mL/min — ABNORMAL LOW (ref >60)
Glucose, Bld: 153 mg/dL — ABNORMAL HIGH (ref 70–99)
Sodium: 137 mEq/L (ref 135–145)

## 2011-04-28 LAB — CROSSMATCH: Unit division: 0

## 2011-04-28 LAB — CBC
Hemoglobin: 10.3 g/dL — ABNORMAL LOW (ref 13.0–17.0)
MCHC: 33.9 g/dL (ref 30.0–36.0)
Platelets: 190 10*3/uL (ref 150–400)
RDW: 14.1 % (ref 11.5–15.5)

## 2011-04-28 LAB — GLUCOSE, CAPILLARY: Glucose-Capillary: 155 mg/dL — ABNORMAL HIGH (ref 70–99)

## 2011-04-29 LAB — GLUCOSE, CAPILLARY
Glucose-Capillary: 158 mg/dL — ABNORMAL HIGH (ref 70–99)
Glucose-Capillary: 171 mg/dL — ABNORMAL HIGH (ref 70–99)

## 2011-04-29 LAB — BASIC METABOLIC PANEL
CO2: 28 mEq/L (ref 19–32)
GFR calc Af Amer: 38 mL/min — ABNORMAL LOW (ref >60)
GFR calc non Af Amer: 31 mL/min — ABNORMAL LOW (ref >60)
Glucose, Bld: 115 mg/dL — ABNORMAL HIGH (ref 70–99)
Potassium: 3.9 mEq/L (ref 3.5–5.1)
Sodium: 142 mEq/L (ref 135–145)

## 2011-04-30 LAB — GLUCOSE, CAPILLARY: Glucose-Capillary: 115 mg/dL — ABNORMAL HIGH (ref 70–99)

## 2011-04-30 LAB — BASIC METABOLIC PANEL
BUN: 24 mg/dL — ABNORMAL HIGH (ref 6–23)
Chloride: 106 mEq/L (ref 96–112)
Creatinine, Ser: 1.41 mg/dL (ref 0.4–1.5)
Glucose, Bld: 124 mg/dL — ABNORMAL HIGH (ref 70–99)

## 2011-05-02 NOTE — Discharge Summary (Signed)
NAME:  Samuel Ryan, Samuel Ryan NO.:  [id number]  MEDICAL RECORD NO.:  [id number]           PATIENT TYPE:  I  LOCATION:  1621                         FACILITY:  [facility]  PHYSICIAN:  Madlyn Frankel. Charlann Boxer, M.D.  DATE OF BIRTH:  July 24, 1931  DATE OF ADMISSION:  04/23/2011 DATE OF DISCHARGE:  04/30/2011                        DISCHARGE SUMMARY - REFERRING   ADMITTING DIAGNOSIS:  Right knee osteoarthritis.  DISCHARGE DIAGNOSES: 1. Right knee osteoarthritis, status post right total knee     replacement. 2. Urinary retention with an associated acute reduction in renal     function with an elevated creatinine, requiring Foley placement. 3. Diabetes. 4. Hypertension. 5. History of benign prostatic hypertrophy. 6. Hyperlipidemia.  ADMITTING HISTORY:  Samuel Ryan is a 77 year old gentleman who presented to the office for evaluation of bilateral knee pain, right greater left. He had failed conservative measures and was having persistent problems affecting his overall quality of life.  At this point, he wished to proceed with more definitive measures.  Risks and benefits were discussed in the office setting.  Consent was obtained.  At the time of the evaluation, he had previously been treated for a sebaceous cyst without current antibiotic use.  He was given home medications of aspirin and Robaxin.  HOSPITAL COURSE:  The patient was admitted for same-day surgery on Apr 23, 2011.  He underwent an uncomplicated right total knee replacement. Please see dictated operative note for full details of the procedure. After routine stay in the recovery room, he was transferred to the orthopedic ward where he remained for his entire hospital stay.  His surgery was performed on Friday with the intention and hope for him to have gone home either on Sunday or Monday per the preoperative plan. His initial progress was pretty slow with therapy due to discomfort and pain and some apparent lack of  self-motivation to work through this pain.  Nonetheless, by Monday, he was still in the hospital.  On postoperative day 3, which was holiday time, he was noted to have an elevated creatinine of 2.11, on postoperative day 4 was up to 2.43 with a baseline at 1.16.  He continued to have bowel activity with obvious bowel sounds and flatus, minimal bowel movements however.  He did receive 2 units of packed red blood cells at this point for what I felt was more of a symptomatic postoperative anemia, acute blood loss anemia type episode.  I switched him from his pain medicine of oxycodone more to Norco to help decrease concern for constipation.  Based on his limited progress, it was also felt that he will be best off in a skilled nursing facility.  By postop day 5, despite his 2 units transfusion, his hematocrit came up to 30.4, his renal function continued to decline with a bump in his creatinine to 2.95.  It was here that I felt that perhaps based on reports of some incontinence as well as incomplete urination, that perhaps he was havingurinary retention that was causing those renal issues.  Foley catheter was inserted at that point with an immediate return of 1000 mL.  I continued  to follow his creatinine over the next 2 days with a bump on postop day 6 to 2.08 and then on day of discharge postoperative day 7, a further reduction to 1.41.  He had significant improvements all the way around with a full bowel movement following this, with significant improvement overall.  He was willing to perform Physical Therapy but still we plan to go to skilled nursing facility for rehab.  DISCHARGE INSTRUCTIONS:  The patient to be discharged to skilled nursing facility for working on rehab.  He will work on range of motion, strength, and gait training.  I spoke with Dr. Jethro Bolus of [facility] to coordinate a followup plan with him.  He agreed to see Samuel Ryan and follow up  as Samuel Ryan his wife felt that his care in [facility] was not appropriate. The phone number is [phone]; they can ask for Selena Batten, who is a nurse who works for Dr. Patsi Sears.  He will have his Foley in place and will stay on Bactrim b.i.d. until his Foley is removed.  His wound at this point should remain dry with showering.  He needs to return to see Dr. Durene Romans at [facility] in 1 to 2 weeks for wound evaluation.  He will otherwise be on a diabetic diet.  DISCHARGE MEDICATIONS: 1. Colace 100 mg p.o. b.i.d. for constipation while on pain medicines. 2. MiraLAX 17 g p.o. daily as needed for constipation while on pain     medicine. 3. Aspirin 325 mg p.o. b.i.d. for 30 days. 4. Norco 7.5, 2 tablets every 4 to 6 hours as needed for pain. 5. Robaxin 500 mg p.o. q.6 h as need for muscle spasm/pain. 6. Senokot-S. 7. Bactrim b.i.d. for 10 days. 8. Amlodipine 10 mg q.a.m. 9. Finasteride 5 mg at bedtime. 10.Glipizide 10 mg b.i.d. 11.Hydrochlorothiazide 12.5 mg p.o. at bedtime. 12.Lovastatin 40 mg at bedtime. 13.Metformin 500 mg p.o. b.i.d. 14.Onglyza 5 mg q.a.m. 15.Tamsulosin 0.4 mg at bedtime.     Madlyn Frankel Charlann Boxer, M.D.     MDO/MEDQ  D:  04/30/2011  T:  04/30/2011  Job:  [id number]  Electronically Signed by Durene Romans M.D. on 05/02/2011 04:26:19 PM

## 2011-05-26 ENCOUNTER — Encounter: Payer: Self-pay | Admitting: Family Medicine

## 2011-05-27 ENCOUNTER — Ambulatory Visit: Payer: Medicare Other | Admitting: Family Medicine

## 2011-05-27 ENCOUNTER — Telehealth: Payer: Self-pay | Admitting: Family Medicine

## 2011-05-30 NOTE — Telephone Encounter (Signed)
pls tell him i hope he is recovering well. I did not stop any meds, have not seen him since the surgery and am unclear as to what med he is talking about, should get the ok to resume from the doc who stopped them probably Ask that he sched an appt in the office in the next 3 to 6 weeks pls

## 2011-05-31 ENCOUNTER — Telehealth: Payer: Self-pay | Admitting: *Deleted

## 2011-05-31 ENCOUNTER — Encounter: Payer: Self-pay | Admitting: Family Medicine

## 2011-05-31 NOTE — Telephone Encounter (Signed)
Coming in 7.3.12 @ 4:00

## 2011-05-31 NOTE — Telephone Encounter (Signed)
pls see if he can come  in as OV tomorrow in the space that I asked you to remove since pt had just been see pls, Samuel Ryan, if so give appt

## 2011-06-01 ENCOUNTER — Other Ambulatory Visit: Payer: Self-pay | Admitting: Family Medicine

## 2011-06-01 ENCOUNTER — Encounter: Payer: Self-pay | Admitting: Family Medicine

## 2011-06-01 ENCOUNTER — Ambulatory Visit (INDEPENDENT_AMBULATORY_CARE_PROVIDER_SITE_OTHER): Payer: Medicare Other | Admitting: Family Medicine

## 2011-06-01 VITALS — BP 120/70 | HR 76 | Resp 16

## 2011-06-01 DIAGNOSIS — E049 Nontoxic goiter, unspecified: Secondary | ICD-10-CM

## 2011-06-01 DIAGNOSIS — F068 Other specified mental disorders due to known physiological condition: Secondary | ICD-10-CM

## 2011-06-01 DIAGNOSIS — M25559 Pain in unspecified hip: Secondary | ICD-10-CM

## 2011-06-01 DIAGNOSIS — E669 Obesity, unspecified: Secondary | ICD-10-CM

## 2011-06-01 DIAGNOSIS — E785 Hyperlipidemia, unspecified: Secondary | ICD-10-CM

## 2011-06-01 DIAGNOSIS — I10 Essential (primary) hypertension: Secondary | ICD-10-CM

## 2011-06-01 DIAGNOSIS — E119 Type 2 diabetes mellitus without complications: Secondary | ICD-10-CM

## 2011-06-01 MED ORDER — KETOROLAC TROMETHAMINE 60 MG/2ML IM SOLN
60.0000 mg | Freq: Once | INTRAMUSCULAR | Status: AC
  Administered 2011-06-01: 60 mg via INTRAMUSCULAR

## 2011-06-01 NOTE — Patient Instructions (Signed)
F/u in 2 months  Labs today, cbc, chem 7 , tsh, HBA1C , hepatic.  Pls start a stool softener, colace 1 twice daily also senokt s tablet 1 daily to help with bowels.  Drink at least 6 to 8 glasses of water every day and keep legs up when able

## 2011-06-01 NOTE — Progress Notes (Signed)
  Subjective:    Patient ID: Samuel Ryan, male    DOB: 1930-12-23, 77 y.o.   MRN: [id number]  HPI Pt had right knee replacement May 25, went to rehab 06/01 and back home on 05/20/2011. States he is extremely weak still with poor apetite, tearful stating he wants to live. He c/o bilateeral hip pain, uncontrolled, requests anti-inflammatory injection for this. Denies hypoglycemic episodes, polyuria or polydipsia. Denies any recent fever or chill, head or chest congestion. He is receiving home PT, and has had no falls. He had called in c/o leg swelling , and requesting help with medication clarification, since some changes had been made following his recent hospitalization.     Review of Systems Denies recent fever or chills. Denies sinus pressure, nasal congestion, ear pain or sore throat. Denies chest congestion, productive cough or wheezing. Denies chest pains, palpitations, paroxysmal nocturnal dyspnea, orthopnea and c/o leg swelling Denies abdominal pain, nausea, vomiting or diarrhea, he does have  Constipation.c/o hard stool every 2 to 3 days.  Denies rectal bleeding or change in bowel movement.c/o poor apetite Denies dysuria, frequency, hesitancy or incontinence. Denies headaches, seizure, numbness, or tingling. Depressed , anxious about his health, and tearful Denies skin break down or rash.        Objective:   Physical Exam Patient alert and oriented and in no Cardiopulmonary distress.  HEENT: No facial asymmetry, EOMI, no sinus tenderness, TM's clear, Oropharynx pink and moist.  Neck supple no adenopathy.Bitemporal wasting  Chest: Clear to auscultation bilaterally.  CVS: S1, S2 no murmurs, no S3.  ABD: Soft non tender. Bowel sounds normal.  Ext: trace edema  MS: decreased ROM spine, shoulders, hips and knees.  Skin: Intact, no ulcerations or rash noted.  Psych: Good eye contact,flat affect. Memory loss, both  anxiousand depressed appearing.  CNS: CN 2-12 intact,  power, normal throughout.        Assessment & Plan:

## 2011-06-02 LAB — BASIC METABOLIC PANEL
CO2: 21 mEq/L (ref 19–32)
Chloride: 101 mEq/L (ref 96–112)
Glucose, Bld: 100 mg/dL — ABNORMAL HIGH (ref 70–99)
Sodium: 136 mEq/L (ref 135–145)

## 2011-06-02 LAB — CBC WITH DIFFERENTIAL/PLATELET
Hemoglobin: 11.2 g/dL — ABNORMAL LOW (ref 13.0–17.0)
Lymphocytes Relative: 27 % (ref 12–46)
Lymphs Abs: 1.4 10*3/uL (ref 0.7–4.0)
MCV: 87 fL (ref 78.0–100.0)
Monocytes Relative: 10 % (ref 3–12)
Neutrophils Relative %: 60 % (ref 43–77)
Platelets: 267 10*3/uL (ref 150–400)
RBC: 3.86 MIL/uL — ABNORMAL LOW (ref 4.22–5.81)
WBC: 5.1 10*3/uL (ref 4.0–10.5)

## 2011-06-02 LAB — HEPATIC FUNCTION PANEL
AST: 24 U/L (ref 0–37)
Alkaline Phosphatase: 87 U/L (ref 39–117)
Bilirubin, Direct: 0.3 mg/dL (ref 0.0–0.3)
Indirect Bilirubin: 0.7 mg/dL (ref 0.0–0.9)
Total Bilirubin: 1 mg/dL (ref 0.3–1.2)

## 2011-06-02 LAB — HEMOGLOBIN A1C: Mean Plasma Glucose: 183 mg/dL — ABNORMAL HIGH (ref <117)

## 2011-06-03 ENCOUNTER — Encounter: Payer: Self-pay | Admitting: Family Medicine

## 2011-06-03 LAB — ANEMIA PANEL
%SAT: 16 % — ABNORMAL LOW (ref 20–55)
ABS Retic: 38.4 10*3/uL (ref 19.0–186.0)
Iron: 48 ug/dL (ref 42–165)
TIBC: 298 ug/dL (ref 215–435)
UIBC: 250 ug/dL

## 2011-06-03 NOTE — Assessment & Plan Note (Addendum)
Uncontroled, will check HBa1C when due, pt encouraged to follow low carb diet and take med as prescribed

## 2011-06-03 NOTE — Assessment & Plan Note (Signed)
Deteriorated, toradol administered 

## 2011-06-03 NOTE — Assessment & Plan Note (Signed)
Improved. Pt applauded on succesful weight loss through lifestyle change, and encouraged to continue same. Weight loss goal set for the next several months.  

## 2011-06-03 NOTE — Assessment & Plan Note (Signed)
Mildly deteriorated, likely due to recent stress of surgery

## 2011-06-10 ENCOUNTER — Other Ambulatory Visit: Payer: Self-pay | Admitting: Family Medicine

## 2011-06-11 ENCOUNTER — Telehealth: Payer: Self-pay | Admitting: Family Medicine

## 2011-06-11 LAB — URINALYSIS
Bacteria, UA: NONE SEEN
Ketones, ur: NEGATIVE mg/dL
Nitrite: NEGATIVE
Urobilinogen, UA: 0.2 mg/dL (ref 0.0–1.0)

## 2011-06-11 NOTE — Telephone Encounter (Signed)
Pt will indwelling cath so UA could just show contaminant but has had poorly controlled DM so this may put him at inc risk o infection. Will go ahead and tx since symptomatic. Culture has been sent by Home health.

## 2011-06-11 NOTE — Telephone Encounter (Signed)
He is doing ok, having signs of UTI. Advanced did UA per Marijean Niemann. Had large amount of leukocytes. Waiting on culture results. UA sent to doctor on call. Awaiting response

## 2011-06-12 LAB — URINE CULTURE: Colony Count: >=100000

## 2011-06-12 NOTE — Telephone Encounter (Signed)
Since bactrim on his list and per his pharm was recently o keflex, will tx with cipro. He has indwelling cath so this could be colonization and just irritation from the cath itself.  Culture is pending. Call in cipro 500mg  bid for 10 days to [facility].

## 2011-06-15 ENCOUNTER — Telehealth: Payer: Self-pay | Admitting: *Deleted

## 2011-06-15 MED ORDER — FLUCONAZOLE 100 MG PO TABS: 100.0000 mg | ORAL_TABLET | Freq: Every day | ORAL | Status: AC

## 2011-06-15 NOTE — Progress Notes (Signed)
Addended by: Adella Hare B on: 06/15/2011 09:49 AM   Modules accepted: Orders, Medications

## 2011-06-15 NOTE — Progress Notes (Signed)
Med sent for yeast, patient wife aware

## 2011-06-16 ENCOUNTER — Ambulatory Visit: Payer: Medicare Other | Admitting: Family Medicine

## 2011-06-16 ENCOUNTER — Ambulatory Visit: Payer: Medicare Other | Admitting: Physical Therapy

## 2011-06-17 ENCOUNTER — Encounter: Payer: Self-pay | Admitting: Family Medicine

## 2011-06-17 NOTE — Telephone Encounter (Signed)
Opened in error

## 2011-06-20 ENCOUNTER — Emergency Department (HOSPITAL_COMMUNITY)
Admission: EM | Admit: 2011-06-20 | Discharge: 2011-06-21 | Disposition: A | Discharge status: Home / Self Care | Payer: Medicare Other | Attending: Emergency Medicine | Admitting: Emergency Medicine

## 2011-06-20 DIAGNOSIS — E7800 Pure hypercholesterolemia, unspecified: Secondary | ICD-10-CM | POA: Insufficient documentation

## 2011-06-20 DIAGNOSIS — Z79899 Other long term (current) drug therapy: Secondary | ICD-10-CM | POA: Insufficient documentation

## 2011-06-20 DIAGNOSIS — E119 Type 2 diabetes mellitus without complications: Secondary | ICD-10-CM | POA: Insufficient documentation

## 2011-06-20 DIAGNOSIS — R319 Hematuria, unspecified: Secondary | ICD-10-CM | POA: Insufficient documentation

## 2011-06-20 DIAGNOSIS — Z7982 Long term (current) use of aspirin: Secondary | ICD-10-CM | POA: Insufficient documentation

## 2011-06-21 ENCOUNTER — Telehealth: Payer: Self-pay | Admitting: Family Medicine

## 2011-06-21 ENCOUNTER — Inpatient Hospital Stay (HOSPITAL_COMMUNITY)
Admission: EM | Admit: 2011-06-21 | Discharge: 2011-06-26 | DRG: 176 | Disposition: A | Discharge status: Home Health | Payer: Medicare Other | Attending: Internal Medicine | Admitting: Internal Medicine

## 2011-06-21 ENCOUNTER — Emergency Department (HOSPITAL_COMMUNITY): Payer: Medicare Other

## 2011-06-21 DIAGNOSIS — E119 Type 2 diabetes mellitus without complications: Secondary | ICD-10-CM | POA: Diagnosis present

## 2011-06-21 DIAGNOSIS — N400 Enlarged prostate without lower urinary tract symptoms: Secondary | ICD-10-CM | POA: Diagnosis present

## 2011-06-21 DIAGNOSIS — R0902 Hypoxemia: Secondary | ICD-10-CM | POA: Diagnosis present

## 2011-06-21 DIAGNOSIS — I82409 Acute embolism and thrombosis of unspecified deep veins of unspecified lower extremity: Secondary | ICD-10-CM | POA: Diagnosis present

## 2011-06-21 DIAGNOSIS — R791 Abnormal coagulation profile: Secondary | ICD-10-CM | POA: Diagnosis present

## 2011-06-21 DIAGNOSIS — I517 Cardiomegaly: Secondary | ICD-10-CM | POA: Diagnosis present

## 2011-06-21 DIAGNOSIS — Z96659 Presence of unspecified artificial knee joint: Secondary | ICD-10-CM

## 2011-06-21 DIAGNOSIS — D649 Anemia, unspecified: Secondary | ICD-10-CM | POA: Diagnosis present

## 2011-06-21 DIAGNOSIS — K5900 Constipation, unspecified: Secondary | ICD-10-CM | POA: Diagnosis not present

## 2011-06-21 DIAGNOSIS — F068 Other specified mental disorders due to known physiological condition: Secondary | ICD-10-CM | POA: Diagnosis present

## 2011-06-21 DIAGNOSIS — R339 Retention of urine, unspecified: Secondary | ICD-10-CM | POA: Diagnosis present

## 2011-06-21 DIAGNOSIS — R799 Abnormal finding of blood chemistry, unspecified: Secondary | ICD-10-CM | POA: Diagnosis present

## 2011-06-21 DIAGNOSIS — I2699 Other pulmonary embolism without acute cor pulmonale: Principal | ICD-10-CM | POA: Diagnosis present

## 2011-06-21 LAB — CBC
Hemoglobin: 10 g/dL — ABNORMAL LOW (ref 13.0–17.0)
MCH: 28.7 pg (ref 26.0–34.0)
MCV: 88.2 fL (ref 78.0–100.0)
Platelets: 181 10*3/uL (ref 150–400)
RBC: 3.48 MIL/uL — ABNORMAL LOW (ref 4.22–5.81)
WBC: 7.1 10*3/uL (ref 4.0–10.5)

## 2011-06-21 LAB — URINALYSIS, ROUTINE W REFLEX MICROSCOPIC
Bilirubin Urine: NEGATIVE
Glucose, UA: NEGATIVE mg/dL
Protein, ur: 100 mg/dL — AB

## 2011-06-21 LAB — TROPONIN I: Troponin I: 0.52 ng/mL (ref <0.30)

## 2011-06-21 LAB — BASIC METABOLIC PANEL
CO2: 24 mEq/L (ref 19–32)
Calcium: 9.1 mg/dL (ref 8.4–10.5)
Creatinine, Ser: 1.4 mg/dL — ABNORMAL HIGH (ref 0.50–1.35)
GFR calc Af Amer: 59 mL/min — ABNORMAL LOW (ref >60)
Sodium: 135 mEq/L (ref 135–145)

## 2011-06-21 LAB — CK TOTAL AND CKMB (NOT AT ARMC)
Relative Index: INVALID (ref 0.0–2.5)
Total CK: 44 U/L (ref 7–232)

## 2011-06-21 LAB — URINE MICROSCOPIC-ADD ON

## 2011-06-21 NOTE — Telephone Encounter (Signed)
Can you put him on the schedule somewhere tomorrow afternoon?

## 2011-06-22 ENCOUNTER — Emergency Department (HOSPITAL_COMMUNITY): Payer: Medicare Other

## 2011-06-22 ENCOUNTER — Telehealth: Payer: Self-pay | Admitting: Family Medicine

## 2011-06-22 ENCOUNTER — Encounter (HOSPITAL_COMMUNITY): Payer: Self-pay

## 2011-06-22 DIAGNOSIS — R55 Syncope and collapse: Secondary | ICD-10-CM

## 2011-06-22 DIAGNOSIS — R7989 Other specified abnormal findings of blood chemistry: Secondary | ICD-10-CM

## 2011-06-22 DIAGNOSIS — I2699 Other pulmonary embolism without acute cor pulmonale: Secondary | ICD-10-CM

## 2011-06-22 LAB — BLOOD GAS, ARTERIAL
Acid-base deficit: 2.6 mmol/L — ABNORMAL HIGH (ref 0.0–2.0)
Bicarbonate: 20.8 mEq/L (ref 20.0–24.0)
Drawn by: 232811
FIO2: 0.4 %
O2 Saturation: 93.8 %
TCO2: 19.3 mmol/L (ref 0–100)
pCO2 arterial: 32 mmHg — ABNORMAL LOW (ref 35.0–45.0)
pO2, Arterial: 69.8 mmHg — ABNORMAL LOW (ref 80.0–100.0)

## 2011-06-22 LAB — URINALYSIS, ROUTINE W REFLEX MICROSCOPIC
Bilirubin Urine: NEGATIVE
Glucose, UA: NEGATIVE mg/dL
Specific Gravity, Urine: 1.013 (ref 1.005–1.030)
pH: 5.5 (ref 5.0–8.0)

## 2011-06-22 LAB — MRSA PCR SCREENING: MRSA by PCR: NEGATIVE

## 2011-06-22 LAB — URINE MICROSCOPIC-ADD ON

## 2011-06-22 LAB — COMPREHENSIVE METABOLIC PANEL
ALT: 7 U/L (ref 0–53)
AST: 16 U/L (ref 0–37)
Alkaline Phosphatase: 72 U/L (ref 39–117)
CO2: 22 mEq/L (ref 19–32)
Calcium: 9.1 mg/dL (ref 8.4–10.5)
Potassium: 4.1 mEq/L (ref 3.5–5.1)
Sodium: 138 mEq/L (ref 135–145)
Total Protein: 6.5 g/dL (ref 6.0–8.3)

## 2011-06-22 LAB — PRO B NATRIURETIC PEPTIDE: Pro B Natriuretic peptide (BNP): 1939 pg/mL — ABNORMAL HIGH (ref 0–450)

## 2011-06-22 LAB — PROTIME-INR: INR: 1.29 (ref 0.00–1.49)

## 2011-06-22 LAB — URINE CULTURE: Colony Count: NO GROWTH

## 2011-06-22 LAB — DIFFERENTIAL
Eosinophils Relative: 2 % (ref 0–5)
Lymphocytes Relative: 17 % (ref 12–46)
Lymphs Abs: 1.2 10*3/uL (ref 0.7–4.0)
Monocytes Relative: 10 % (ref 3–12)
Neutro Abs: 5.1 10*3/uL (ref 1.7–7.7)

## 2011-06-22 LAB — CK TOTAL AND CKMB (NOT AT ARMC)
CK, MB: 3.6 ng/mL (ref 0.3–4.0)
Relative Index: INVALID (ref 0.0–2.5)
Total CK: 58 U/L (ref 7–232)

## 2011-06-22 LAB — GLUCOSE, CAPILLARY
Glucose-Capillary: 138 mg/dL — ABNORMAL HIGH (ref 70–99)
Glucose-Capillary: 69 mg/dL — ABNORMAL LOW (ref 70–99)
Glucose-Capillary: 117 mg/dL — ABNORMAL HIGH (ref 70–99)

## 2011-06-22 LAB — CARDIAC PANEL(CRET KIN+CKTOT+MB+TROPI)
CK, MB: 4.2 ng/mL — ABNORMAL HIGH (ref 0.3–4.0)
Relative Index: INVALID (ref 0.0–2.5)
Total CK: 64 U/L (ref 7–232)
Troponin I: 0.61 ng/mL (ref <0.30)

## 2011-06-22 LAB — LIPID PANEL
LDL Cholesterol: 40 mg/dL (ref 0–99)
Triglycerides: 72 mg/dL (ref <150)
VLDL: 14 mg/dL (ref 0–40)

## 2011-06-22 LAB — APTT: aPTT: 36 seconds (ref 24–37)

## 2011-06-22 LAB — HEPARIN LEVEL (UNFRACTIONATED): Heparin Unfractionated: 0.27 IU/mL — ABNORMAL LOW (ref 0.30–0.70)

## 2011-06-22 LAB — HEMOGLOBIN A1C
Hgb A1c MFr Bld: 7.3 % — ABNORMAL HIGH (ref <5.7)
Mean Plasma Glucose: 163 mg/dL — ABNORMAL HIGH (ref <117)

## 2011-06-22 LAB — TSH: TSH: 0.423 u[IU]/mL (ref 0.350–4.500)

## 2011-06-22 MED ORDER — TECHNETIUM TO 99M ALBUMIN AGGREGATED
5.0000 | Freq: Once | INTRAVENOUS | Status: AC | PRN
  Administered 2011-06-22: 5 via INTRAVENOUS

## 2011-06-22 NOTE — Telephone Encounter (Signed)
Left message

## 2011-06-22 NOTE — H&P (Signed)
NAME:  MERRIC, YOST NO.:  [id number]  MEDICAL RECORD NO.:  [id number]  LOCATION:  WLED                         FACILITY:  [facility]  PHYSICIAN:  Michiel Cowboy, MDDATE OF BIRTH:  06-Sep-1931  DATE OF ADMISSION:  06/22/2011 DATE OF DISCHARGE:                             HISTORY & PHYSICAL   PRIMARY CARE PROVIDER:  Milus Mallick. Lodema Hong, MD, [facility].  CHIEF COMPLAINT:  Syncope.  HISTORY OF PRESENT ILLNESS:  The patient is a 77 year old gentleman with past medical history significant for dementia, diabetes, hypercholesteremia, right knee replacement in June of this year.  The patient also has had trouble with urinary retention requiring placement of urinary Foley since his discharge for his knee replacement.  He has been followed for this by Urology.  Today, he got up to attempt to use a bathroom.  He uses a portside commode and then slumped over while sitting on the commode.  His wife notes that he was breathing, but was very clammy.  She slapped his cheeks a few times and eventually he came about, but when he tried to get up, he felt very lightheaded and she asked him to sit back down.  She called EMS.  He was brought into the emergency department.  He was found to be hypoxic, requiring nonrebreather in order to maintain his oxygenation.  Given elevated D- dimer and intermediate nuclear medicine V/Q test, it is felt as if he possibly has had a PE, at which point he was started on heparin and [facility] hospitalist was called.  He has not had any chest pain or shortness of breath.  He has not had any significant leg swelling that he could tell, although this is concerned since his lower right leg is slightly larger.  He does suffer from mild dementia, unable to provide detailed history, but per family had not had any fevers or chills, nausea, no vomiting, no constipation recently.  REVIEW OF SYSTEMS:  Otherwise negative except for  HPI.  PAST MEDICAL HISTORY:  Significant for 1. Dementia. 2. Diabetes. 3. Hypercholesteremia. 4. Left ventricular hypertrophy. 5. Prostatic hypertrophy. 6. Urinary retention, status post indwelling Foley with recent     treatment for UTI and a yeast infection.  SOCIAL HISTORY:  The patient lives with his wife, does not smoke or drink or abuse drugs.  FAMILY HISTORY:  Noncontributory.  ALLERGIES:  DEMEROL makes him agitated.  MEDICATIONS: 1. Onglyza 5 mg daily. 2. Cipro, he just finished his course. 3. Hydrochlorothiazide 12.5 mg daily. 4. Diflucan 100 mg daily. 5. Tramadol 50 mg as needed. 6. Methocarbamol 500 mg as needed. 7. Glipizide 10 mg daily. 8. Lovastatin 40 mg daily. 9. Omeprazole 10 mg at bedtime.  PHYSICAL EXAMINATION:  VITAL SIGNS:  Temperature 97.2, blood pressure 119/64, pulse 107, respirations 20, satting currently mid 80s to low 90s on 40% FIO2 by Venturi mask. GENERAL:  The patient appears to be elderly male, in no acute distress. HEENT:  Head nontraumatic.  Moist, but somewhat pale mucous membranes. LUNGS:  I do not appreciate any crackles or wheezes anteriorly. HEART:  Regular rate and rhythm, but somewhat rapid.  No murmurs appreciated. ABDOMEN:  Soft,  nontender, nondistended. EXTREMITIES:  Perhaps trace edema on the right lower extremity with a healing scar over the knee.  Otherwise unremarkable. NEUROLOGICAL:  Grossly intact.  He is alert and oriented to self and somewhat to situation.  DIAGNOSTIC STUDIES:  ABG 7.426/32/69.8.  White blood cell count 7.1, hemoglobin 10, which is his baseline.  Sodium 135, potassium 4.2, creatinine 1.4, lactic acid 1.3, CK total of 44 with CK-MB 2.1, troponin slightly elevated 0.52, D-dimer greater than 20, and INR 1.29.  Nuclear medicine V/Q scan is showing to me probability for PE.  The patient is Hemoccult negative from below.  CT scan of the head and chest x-rays both unremarkable.  EKG is showing sinus  tachycardia, heart rate is 106. I do not appreciate any S waves in lead III, but there is somewhat of an S wave in lead II and lead I.  Otherwise, no ischemic changes.  ASSESSMENT/PLAN:  This is a 77 year old gentleman with hypoxia, syncope, and possible pulmonary embolism versus non-ST-elevation myocardial infarction. 1. Syncope.  I wonder if he had a pulmonary embolism, which resulted     in a syncope.  Given his severe degree of hypoxia, despite normal     chest x-ray and an intermediate nuclear scan, we will do further     evaluation.  We will put him on heparin for presumed PE.  Continue     to cycle cardiac enzymes, obtain 2-D echo, give IV fluids to     maintain his blood pressure, put in the ICU for further monitoring.     As part of syncope workup, we will obtain carotid Dopplers.  We     will also check bilateral lower extremity Dopplers given the     history of recent right knee replacement puts him at high risk for     clotting. 2. Diabetes mellitus.  We will hold his p.o. medications.  For now, we     will continue sliding scale only. 3. History of urinary retention.  Continue Foley, watch his     creatinine.  Of note, the patient has had a recent blood in his     urine.  Currently, this is improved.  He has a UA showing a     positive blood and white blood cell count.  There are no nitrites     and few bacteria.  I would not necessary feel at this point this is     a true UTI and we will await the results of urine culture.  Since     he was continued on Diflucan, we will continue that for now. 4. Dementia.  Continue donepezil. 5. Prophylaxis, Protonix and heparin drip. 6. Code Status:  Per family's wishes as well as the patient's wishes,     he is at this point to remain full code. 7. Elevated troponin.  This could be related to pulmonary embolism     versus non-ST elevation myocardial     infarction.  At this point, there is no evidence of ischemia on his     chest  x-ray and he is chest pain free.  We will continue heparin.     Consider cardiology consult in the morning, especially if his     markers continue to increase or if he develops any evidence of     chest pain.     Michiel Cowboy, MD     AVD/MEDQ  D:  06/22/2011  T:  06/22/2011  Job:  [id number]  cc:   Milus Mallick. Lodema Hong, M.D. Fax: [phone]  Electronically Signed by Therisa Doyne MD on 06/22/2011 10:18:28 PM

## 2011-06-22 NOTE — Telephone Encounter (Signed)
Noted. Dr. Lodema Hong is aware

## 2011-06-23 LAB — GLUCOSE, CAPILLARY
Glucose-Capillary: 102 mg/dL — ABNORMAL HIGH (ref 70–99)
Glucose-Capillary: 107 mg/dL — ABNORMAL HIGH (ref 70–99)
Glucose-Capillary: 125 mg/dL — ABNORMAL HIGH (ref 70–99)
Glucose-Capillary: 130 mg/dL — ABNORMAL HIGH (ref 70–99)

## 2011-06-23 LAB — URINE CULTURE
Culture  Setup Time: 201207240402
Culture: NO GROWTH

## 2011-06-23 LAB — PROTIME-INR: Prothrombin Time: 16.4 seconds — ABNORMAL HIGH (ref 11.6–15.2)

## 2011-06-23 LAB — CBC
HCT: 29.2 % — ABNORMAL LOW (ref 39.0–52.0)
Hemoglobin: 9.6 g/dL — ABNORMAL LOW (ref 13.0–17.0)
MCH: 28.9 pg (ref 26.0–34.0)
RBC: 3.32 MIL/uL — ABNORMAL LOW (ref 4.22–5.81)

## 2011-06-23 LAB — HEPARIN LEVEL (UNFRACTIONATED): Heparin Unfractionated: 0.49 IU/mL (ref 0.30–0.70)

## 2011-06-23 LAB — BASIC METABOLIC PANEL
BUN: 8 mg/dL (ref 6–23)
CO2: 23 mEq/L (ref 19–32)
Chloride: 106 mEq/L (ref 96–112)
Creatinine, Ser: 1.05 mg/dL (ref 0.50–1.35)

## 2011-06-24 LAB — GLUCOSE, CAPILLARY
Glucose-Capillary: 120 mg/dL — ABNORMAL HIGH (ref 70–99)
Glucose-Capillary: 96 mg/dL (ref 70–99)
Glucose-Capillary: 154 mg/dL — ABNORMAL HIGH (ref 70–99)

## 2011-06-24 LAB — HEPARIN LEVEL (UNFRACTIONATED)
Heparin Unfractionated: 0.73 IU/mL — ABNORMAL HIGH (ref 0.30–0.70)
Heparin Unfractionated: 0.71 IU/mL — ABNORMAL HIGH (ref 0.30–0.70)

## 2011-06-24 LAB — CBC
MCH: 29 pg (ref 26.0–34.0)
MCHC: 33.1 g/dL (ref 30.0–36.0)
RDW: 14.3 % (ref 11.5–15.5)

## 2011-06-24 LAB — PROTIME-INR
INR: 1.49 (ref 0.00–1.49)
Prothrombin Time: 18.3 seconds — ABNORMAL HIGH (ref 11.6–15.2)

## 2011-06-25 LAB — GLUCOSE, CAPILLARY
Glucose-Capillary: 100 mg/dL — ABNORMAL HIGH (ref 70–99)
Glucose-Capillary: 104 mg/dL — ABNORMAL HIGH (ref 70–99)
Glucose-Capillary: 130 mg/dL — ABNORMAL HIGH (ref 70–99)
Glucose-Capillary: 88 mg/dL (ref 70–99)

## 2011-06-25 LAB — CBC
HCT: 28.6 % — ABNORMAL LOW (ref 39.0–52.0)
MCV: 87.5 fL (ref 78.0–100.0)
RBC: 3.27 MIL/uL — ABNORMAL LOW (ref 4.22–5.81)
RDW: 14.4 % (ref 11.5–15.5)
WBC: 4.3 10*3/uL (ref 4.0–10.5)

## 2011-06-26 LAB — GLUCOSE, CAPILLARY
Glucose-Capillary: 113 mg/dL — ABNORMAL HIGH (ref 70–99)
Glucose-Capillary: 95 mg/dL (ref 70–99)

## 2011-06-26 LAB — CBC
HCT: 27.2 % — ABNORMAL LOW (ref 39.0–52.0)
MCHC: 33.1 g/dL (ref 30.0–36.0)
MCV: 87.5 fL (ref 78.0–100.0)
Platelets: 245 10*3/uL (ref 150–400)
RDW: 14.2 % (ref 11.5–15.5)
WBC: 3.7 10*3/uL — ABNORMAL LOW (ref 4.0–10.5)

## 2011-06-28 ENCOUNTER — Ambulatory Visit (INDEPENDENT_AMBULATORY_CARE_PROVIDER_SITE_OTHER): Payer: Self-pay | Admitting: *Deleted

## 2011-06-28 ENCOUNTER — Telehealth: Payer: Self-pay | Admitting: *Deleted

## 2011-06-28 DIAGNOSIS — I2699 Other pulmonary embolism without acute cor pulmonale: Secondary | ICD-10-CM | POA: Insufficient documentation

## 2011-06-28 DIAGNOSIS — Z7901 Long term (current) use of anticoagulants: Secondary | ICD-10-CM

## 2011-06-28 DIAGNOSIS — I824Y9 Acute embolism and thrombosis of unspecified deep veins of unspecified proximal lower extremity: Secondary | ICD-10-CM

## 2011-06-28 DIAGNOSIS — R0989 Other specified symptoms and signs involving the circulatory and respiratory systems: Secondary | ICD-10-CM

## 2011-06-28 NOTE — Telephone Encounter (Signed)
Patient is Dr.Simpson's patient. Has not been seen here.  PT - 3.8 and INR - 45.  Patient is taking 5mg  QD. / tg

## 2011-06-28 NOTE — Telephone Encounter (Signed)
See coumadin note. 

## 2011-06-28 NOTE — Consult Note (Signed)
NAME:  Samuel Ryan, Samuel Ryan NO.:  [id number]  MEDICAL RECORD NO.:  [id number]  LOCATION:  1238                         FACILITY:  [facility]  PHYSICIAN:  Vesta Mixer, M.D. DATE OF BIRTH:  1931/02/06  DATE OF CONSULTATION:  06/22/2011 DATE OF DISCHARGE:                                CONSULTATION   Samuel Ryan is a 77 year old gentleman with a history of dementia and recent right knee replacement.  He is admitted with an episode of syncope and evaluation has suggested a pulmonary embolus.  He had mildly elevated troponin levels and BMP level and we are asked to comment.  Samuel Ryan is a very pleasant gentleman who has progressive dementia. He has been relatively inactive for the past year because of some orthopedic problems.  He has a very supportive family and he is basically been on a slow and steady decline for the past year or so.  He had a right total knee replacement in May of this year.  He was not placed on Lovenox or any other anticoagulation according to the family. The patient was admitted on July 23rd with a syncopal episode.  He was found to have a markedly elevated D-dimer greater than 20.  The V/Q scan was indeterminate. He was also found to have a left popliteal DVT.  The patient was thought to have a pulmonary embolus.  He had mildly elevated cardiac enzymes.  The patient is pleasantly demented.  He does not have any episodes of chest pain or shortness of breath.  He is basically bedbound.  He is able to transfer from bed to chair with some assistance.  He has been undergoing some physical therapy before this pulmonary embolus and was able to walk with the help of a walker.  The patient has not had any episodes of hypertension.  This is the first episode of syncope that he had.  CURRENT MEDICATIONS: 1. Heparin drip, per Pharmacy. 2. Protonix 40 mg a day. 3. Aspirin 81 mg a day. 4. Rocephin x1. 5. Aricept 10 mg a day. 6. Diflucan 100 mg a  day. 7. Insulin sliding scale. 8. Protonix once a day. 9. Zocor 20 mg a day. 10.Coumadin, per pharmacy protocol. 11.Xopenex as needed. 12.Tylenol as needed.  ALLERGIES:  He is allergic to DEMEROL as this makes him agitated  PAST MEDICAL HISTORY: 1. Dementia. 2. Diabetes mellitus. 3. Hypercholesterolemia. 4. History of left ventricular hypertrophy. 5. Benign prostatic hypertrophy. 6. History of urinary retention.  SOCIAL HISTORY:  The patient lives with his wife.  He does not smoke and does not drink alcohol.  He used to work at a [employer].  REVIEW OF SYSTEMS:  As noted in the HPI.  All other systems were reviewed primarily with family and are negative.  PHYSICAL EXAMINATION:  GENERAL:  He is a very pleasant elderly gentleman, in no acute distress.  He is somewhat slow to respond to questions.  He had asked for help and answering a lot of the questions. VITAL SIGNS:  His blood pressure is 110/58, his heart rate is 95. HEENT:  2+ carotids.  He has no JVD.  His mucous membranes are moist. BACK:  Nontender. LUNGS:  Very poor inspiratory effort.  He had no rales, but his inspiration is fairly poor. HEART:  Regular rate.  His heart sounds are fairly distant.  His pulse is quite regular. ABDOMEN:  Good bowel sounds.  There is no hepatosplenomegaly. EXTREMITIES:  He has no significant edema. NEUROLOGIC:  The patient is somewhat alert and oriented.  He is slow to respond to questions.  He had to ask his family for help for even basic questions.  His gait is not assessed.  He is very weak and I had to help him sit up in bed.  LABORATORY DATA:  His peak troponin was 0.95, his CPK was 64 with an MB of 4.2.  His BNP was 1937.  His sodium is 138, potassium is 4.1, chloride is 106, CO2 is 22, his creatinine is 1.23, glucose 109.  His EKG on admission was normal sinus rhythm.  He has no ST or T wave changes.  He has right axis deviation.  IMPRESSION:  Mild troponin elevation.   This is entirely consistent with his pulmonary embolus.  I do not think that he has had an acute coronary syndrome.  In fact none of his symptoms are consistent with an acute coronary syndrome and he has never had any episodes of chest pain.  He has progressive dementia and is basically bedbound.  He does get up to the chair.  He is not a candidate for any invasive cardiac procedures.  Since these symptoms are all consistent with a pulmonary embolus, I think that we do not need to treat with him any specific cardiac medications.  The patient should be treated with long-term anticoagulation for his deep vein thrombosis and pulmonary embolus.  We will leave that decision to which medication up to the internist.  The patient has seen a cardiologist up in [place] and certainly see [place] again if needed.  I do not think that he needs any specific followup.  Thank you very much for this consultation.  Please call us if you have any questions.     Vesta Mixer, M.D.     PJN/MEDQ  D:  06/22/2011  T:  06/23/2011  Job:  [id number]  cc:   Michiel Cowboy, MD  Electronically Signed by Kristeen Miss M.D. on 06/28/2011 12:13:19 PM

## 2011-07-01 ENCOUNTER — Ambulatory Visit (INDEPENDENT_AMBULATORY_CARE_PROVIDER_SITE_OTHER): Payer: Medicare Other | Admitting: Family Medicine

## 2011-07-01 ENCOUNTER — Encounter: Payer: Self-pay | Admitting: Family Medicine

## 2011-07-01 VITALS — BP 120/74 | HR 65 | Resp 16 | Wt 218.8 lb

## 2011-07-01 DIAGNOSIS — E785 Hyperlipidemia, unspecified: Secondary | ICD-10-CM

## 2011-07-01 DIAGNOSIS — R5383 Other fatigue: Secondary | ICD-10-CM

## 2011-07-01 DIAGNOSIS — E119 Type 2 diabetes mellitus without complications: Secondary | ICD-10-CM

## 2011-07-01 DIAGNOSIS — I82409 Acute embolism and thrombosis of unspecified deep veins of unspecified lower extremity: Secondary | ICD-10-CM

## 2011-07-01 DIAGNOSIS — R5381 Other malaise: Secondary | ICD-10-CM

## 2011-07-01 DIAGNOSIS — F0390 Unspecified dementia without behavioral disturbance: Secondary | ICD-10-CM

## 2011-07-01 DIAGNOSIS — F068 Other specified mental disorders due to known physiological condition: Secondary | ICD-10-CM

## 2011-07-01 DIAGNOSIS — I10 Essential (primary) hypertension: Secondary | ICD-10-CM

## 2011-07-01 DIAGNOSIS — I2699 Other pulmonary embolism without acute cor pulmonale: Secondary | ICD-10-CM

## 2011-07-01 DIAGNOSIS — L8990 Pressure ulcer of unspecified site, unspecified stage: Secondary | ICD-10-CM

## 2011-07-01 MED ORDER — HYDROCHLOROTHIAZIDE 12.5 MG PO CAPS: 12.5000 mg | ORAL_CAPSULE | Freq: Every day | ORAL | Status: DC

## 2011-07-01 MED ORDER — LOVASTATIN 40 MG PO TABS: 40.0000 mg | ORAL_TABLET | Freq: Every day | ORAL | Status: DC

## 2011-07-01 NOTE — Patient Instructions (Addendum)
F/u in 4 to 5 weeks.  You look much better, and I am thankful that you feel much better.  You will be given a date to go to the coumadin clinic at [facility] cardiology.  Pls keep appt with your orthopedic doctor.  Continue all of your current medications you were discharged on Labs  Non fasting by home health 3 to 5 days before next visit, chem 7, cbc  I will contact home health to apply dressing to buttock ulcer till healed

## 2011-07-02 ENCOUNTER — Ambulatory Visit (INDEPENDENT_AMBULATORY_CARE_PROVIDER_SITE_OTHER): Payer: Medicare Other | Admitting: *Deleted

## 2011-07-02 DIAGNOSIS — I2699 Other pulmonary embolism without acute cor pulmonale: Secondary | ICD-10-CM

## 2011-07-02 DIAGNOSIS — I824Y9 Acute embolism and thrombosis of unspecified deep veins of unspecified proximal lower extremity: Secondary | ICD-10-CM

## 2011-07-02 DIAGNOSIS — Z7901 Long term (current) use of anticoagulants: Secondary | ICD-10-CM

## 2011-07-05 ENCOUNTER — Ambulatory Visit: Payer: Medicare Other | Admitting: Family Medicine

## 2011-07-05 ENCOUNTER — Ambulatory Visit (INDEPENDENT_AMBULATORY_CARE_PROVIDER_SITE_OTHER): Payer: Self-pay

## 2011-07-05 DIAGNOSIS — R0989 Other specified symptoms and signs involving the circulatory and respiratory systems: Secondary | ICD-10-CM

## 2011-07-05 LAB — POCT INR: INR: 2.3

## 2011-07-08 NOTE — Discharge Summary (Signed)
NAME:  Samuel Ryan, Samuel Ryan NO.:  [id number]  MEDICAL RECORD NO.:  [id number]  LOCATION:  1419                         FACILITY:  [facility]  PHYSICIAN:  Brendia Sacks, MD    DATE OF BIRTH:  10-11-1931  DATE OF ADMISSION:  06/21/2011 DATE OF DISCHARGE:  06/26/2011                        DISCHARGE SUMMARY - REFERRING   PRIMARY CARE PHYSICIAN:  Milus Mallick. Lodema Hong, M.D. in [place] at [facility].  CONDITION ON DISCHARGE:  Improved.  DISPOSITION:  Home with home health, physical therapy and RN, PT/INR to be checked on July 30th.  CONDITION:  Improved.  DISCHARGE DIAGNOSES: 1. Presumed acute pulmonary embolism. 2. Acute left lower extremity deep vein thrombosis. 3. Diabetes mellitus type 2, stable. 4. Constipation, resolved. 5. Dementia, stable.  HISTORY OF PRESENT ILLNESS:  This is a 77 year old man who is status post right total knee replacement on May 25th, who was admitted on July 23 with syncope and was found to be hypoxic in the emergency department. He was also noted to have a very high D-dimer and intermediate VQ scan. He was admitted for further evaluation and treatment.  HOSPITAL COURSE:  Samuel Ryan was admitted with syncope, hypoxia, high D- dimer, intermediate VQ scan.  He was treated presumptively for pulmonary embolism.  Additionally, he has improving left lower extremity DVT, which was acute and his right ventricle on 2-D echocardiogram showed moderate dilatation highly suggestive of pulmonary embolism.  He also had mild troponin elevation again suggestive of right ventricular strain.  However, after his initial insult in the outpatient setting, the patient has done extremely well.  He has no oxygen requirement at this point.  No chest pain and respiratory status appears to be stable. Given that his hemodynamics have remained stable, he has been treated presumptively without further investigation.  He has received a total of 5  days of heparin overlap and his INR is therapeutic for the last 2 days, so he is now stable for discharge without Lovenox.  He will continue on Coumadin with a PT/INR check on July 30th.  His diabetes has remained extremely well-controlled with minimal insulin sliding scale needed off oral agents.  I will restart him on sitagliptin, but we will hold his glipizide on discharge.  Continue to follow blood sugars in the outpatient setting, resume glipizide as needed.  Constipation resolved with a bowel regimen here in the hospital.  His other medical issues cleared out and stable.  The patient was evaluated by Physical Therapy and skilled rehab was suggested; however, his wife prefers to take him home and will be going home with home health.  CONSULTATIONS:  None.  PROCEDURES:  None.  IMAGING: 1. Chest x-ray July 23:  No focal consolidation, pleural effusion, or     pneumothorax. 2. CT of the head July 24th:  Negative for bleed or other acute     intracranial process.  Atrophy and nonspecific white matter     changes. 3. Nuclear medicine ventilation perfusion scan July 24th:     Intermediate probability for pulmonary embolism.  Perfusion defect     is present, however, unable to perform ventilation component via     examination to evaluate  for mismatch.  MICROBIOLOGY:  Urine culture on July 23rd no growth, final.  ANCILLARY STUDIES: 1. A 2-D echocardiogram, left ventricular ejection fraction 55% to     60%.  Normal wall motion.  No regional wall motion abnormalities.     Right ventricle cavity size was moderately dilated.  Moderate right     ventricular dilatation, elevated PA pressure, consider ruling out     PE in the setting of syncope. 2. Bilateral carotid ultrasounds showed no significant extracranial     carotid artery stenosis.  Vertebrals were patent with antegrade     flow. 3. Bilateral lower extremity venous Dopplers on July 24th:  No     evidence for DVT of the right  lower extremity.  Findings consistent     with acute DVT involving the left lower extremity.  No evidence of     superficial thrombosis or Baker cyst on the right or left.  PERTINENT LABORATORY STUDIES: 1. D-dimer was greater than 20. 2. Hemoglobin A1c 7.3. 3. TSH within normal limits. 4. CBC notable for a hemoglobin of 9.0, which is stable. 5. Discharge INR is 2.42 on July 28th, on July 27th INR was 2.59. 6. Capillary blood sugars well-controlled without hypoglycemia.  EXAM ON DISCHARGE:  The patient is feeling well.  No complaints. Temperature is 97.6, pulse 69, respirations 18, blood pressure 133/70, sat 97% on room air.  DISCHARGE INSTRUCTIONS:  The patient will be discharged home today with physical therapy, RN and PT, increase activity slowly, use only walker. Low-sodium heart-healthy diabetic diet.  Follow up with Dr. Syliva Overman in 1 week.  The patient should have an INR checked on July 30th, which should be followed by Dr. Lodema Hong.  Goal INR is 2 to 3 for diabetes in the setting of pulmonary embolism and DVT.  DISCHARGE MEDICATIONS: 1. Tylenol 325 mg 2 tablets p.o. q.4 hours as needed for pain. 2. Polyethylene glycol 17 g p.o. daily as needed for constipation. 3. Docusate 1 tablet p.o. b.i.d. as needed for constipation. 4. Warfarin 5 mg p.o. every evening at 6 p.m. 5. Aspirin 81 mg p.o. daily. 6. Benazepril 10 mg p.o. daily at bedtime. 7. Hydrochlorothiazide 12.5 mg p.o. daily. 8. Lovastatin 40 mg p.o. q.h.s. 9. Methocarbamol 500 mg p.o. q.i.d. as needed for spasm. 10.Sitagliptin 5 mg p.o. daily. 11.Tramadol 50 mg every 6 hours as needed for pain.  Discontinue the following medications: 1. Glipizide.  See rationale above. 2. Fluconazole. 3. Ciprofloxacin.  Foley catheter will be left in place for history of urinary retention.  Things to follow up in the outpatient setting. 1. Continue treatment for acute PE and DVT. 2. Continue treatment for diabetes  mellitus.  See above.  Time coordinating discharge 25 minutes.     Brendia Sacks, MD     DG/MEDQ  D:  06/26/2011  T:  06/26/2011  Job:  [id number]  cc:   Milus Mallick. Lodema Hong, M.D. Fax: [phone]  Electronically Signed by Brendia Sacks  on 07/08/2011 05:40:53 PM

## 2011-07-09 ENCOUNTER — Ambulatory Visit (INDEPENDENT_AMBULATORY_CARE_PROVIDER_SITE_OTHER): Payer: Self-pay | Admitting: Cardiology

## 2011-07-09 DIAGNOSIS — R0989 Other specified symptoms and signs involving the circulatory and respiratory systems: Secondary | ICD-10-CM

## 2011-07-09 LAB — POCT INR: INR: 2

## 2011-07-15 ENCOUNTER — Ambulatory Visit: Payer: Medicare Other | Admitting: Cardiology

## 2011-07-20 ENCOUNTER — Encounter: Payer: Self-pay | Admitting: Cardiology

## 2011-07-20 ENCOUNTER — Ambulatory Visit (INDEPENDENT_AMBULATORY_CARE_PROVIDER_SITE_OTHER): Payer: Medicare Other | Admitting: Cardiology

## 2011-07-20 ENCOUNTER — Encounter: Payer: Self-pay | Admitting: Family Medicine

## 2011-07-20 VITALS — BP 128/71 | HR 69 | Temp 97.0°F | Resp 20 | Ht 70.0 in | Wt 218.4 lb

## 2011-07-20 DIAGNOSIS — L8990 Pressure ulcer of unspecified site, unspecified stage: Secondary | ICD-10-CM | POA: Insufficient documentation

## 2011-07-20 DIAGNOSIS — I10 Essential (primary) hypertension: Secondary | ICD-10-CM

## 2011-07-20 DIAGNOSIS — I82409 Acute embolism and thrombosis of unspecified deep veins of unspecified lower extremity: Secondary | ICD-10-CM

## 2011-07-20 DIAGNOSIS — I2699 Other pulmonary embolism without acute cor pulmonale: Secondary | ICD-10-CM

## 2011-07-20 DIAGNOSIS — I519 Heart disease, unspecified: Secondary | ICD-10-CM

## 2011-07-20 NOTE — Assessment & Plan Note (Signed)
Uncontrolled encouraged low carb diet, continue med

## 2011-07-20 NOTE — Assessment & Plan Note (Signed)
Blood pressure is well-controlled today. 

## 2011-07-20 NOTE — Progress Notes (Signed)
Clinical Summary Samuel Ryan is a 77 y.o.male presenting for a post hospital followup. He was recently seen in July in consultation by Dr. Elease Hashimoto in the setting of abnormal cardiac markers noted with diagnosis of deep venous thrombosis and suspected pulmonary embolus, presenting with syncope. VQ scan was indeterminate, however d-dimer was significantly elevated, an echocardiogram demonstrated right ventricular dysfunction with elevated right-sided pressures. No further cardiac evaluation was undertaken at that point. He is now followed in our Coumadin clinic, primary care is with Dr. Lodema Hong.  I do not see any prior history of definite CAD or myocardial infarction. Myoview from March 2005, ordered by Dr. Tenny Craw, was low risk.  Patient has dementia with significant functional limitation, has supportive family, also recent home health physical therapy. He indicates that he feels better. His wife confirms this. Denies any issues with bleeding or falls. He does use a walker for limited ambulation.   No Known Allergies  Medication list reviewed.  Past Medical History  Diagnosis Date  . Type 2 diabetes mellitus   . Mixed hyperlipidemia   . Essential hypertension, benign   . Dementia   . BPH (benign prostatic hypertrophy)   . Pulmonary embolism     Diagnosed 7/12  . Deep venous thrombosis     Left leg, diagnosed 7/12  . Osteoarthritis     Past Surgical History  Procedure Date  . Hernia repair   . Neck fusion   . Carpal tunnel release   . Right total knee replacement 04/23/2011    Family History  Problem Relation Age of Onset  . Cancer Mother     Stomach  . Diabetes Sister   . Hypertension Sister   . Diabetes Brother   . Cancer Brother     Gallbladder    Social History Samuel Ryan reports that he has never smoked. He has never used smokeless tobacco. Samuel Ryan reports that he does not drink alcohol.  Review of Systems No reported palpitations or recurrent syncope. No chest pains  or hemoptysis. Indicates a stable appetite. Otherwise negative.  Physical Examination Filed Vitals:   07/20/11 1251  BP: 128/71  Pulse: 69  Temp: 97 F (36.1 C)  Resp: 20  Overweight male in no acute distress. HEENT: Conjunctiva and lids normal, oropharynx with moist mucosa. Neck: Supple, no elevated JVP or significant bruits, no thyromegaly. Lungs: Clear to auscultation, diminished, nonlabored. Cardiac: Regular rate and rhythm, no S3 gallop or rub. Abdomen: Soft, nontender, bowel sounds present. Skin: Warm and dry. Extremities: Chronic appearing edema and some stasis, distal pulses one plus. Musculoskeletal: No kyphosis. Neuropsychiatric: Alert and oriented x3, affect pleasant.   ECG Normal sinus rhythm at 63 beats per minute.  Studies - Left ventricle: The cavity size was normal. Systolic function was     normal. The estimated ejection fraction was in the range of 55% to     60%. Wall motion was normal; there were no regional wall motion     abnormalities.   - Right ventricle: The cavity size was moderately dilated. Wall     thickness was normal.   - Pulmonary arteries: PA peak pressure: 61mm Hg (S).   - Impressions: Overall poor image qualtiy. With moderate RV     dilatation and elevated PA pressure by TR velocity would R/O PE in     setting of syncope   Impressions:    - Overall poor image qualtiy. With moderate RV dilatation and     elevated PA pressure by TR velocity  would R/O PE in setting of     syncope   Problem List and Plan

## 2011-07-20 NOTE — Patient Instructions (Signed)
Your physician recommends that you continue on your current medications as directed. Please refer to the Current Medication list given to you today.  Your physician has requested that you have an echocardiogram. Echocardiography is a painless test that uses sound waves to create images of your heart. It provides your doctor with information about the size and shape of your heart and how well your heart's chambers and valves are working. This procedure takes approximately one hour. There are no restrictions for this procedure.  Your physician recommends that you schedule a follow-up appointment in: 6 months

## 2011-07-20 NOTE — Assessment & Plan Note (Signed)
Pt reportedly has decubitus ulcers and will be referred to home health to further manage

## 2011-07-20 NOTE — Assessment & Plan Note (Signed)
Moderate RV dilatation with elevated pulmonary pressures by echocardiogram in July around the time of diagnosis of apparent pulmonary embolus. This will be reassessed prior to his next visit.

## 2011-07-20 NOTE — Progress Notes (Signed)
  Subjective:    Patient ID: Samuel Ryan, male    DOB: 28-Aug-1931, 77 y.o.   MRN: [id number]  HPI The PT is here for follow up recent hospitalization,  and re-evaluation of chronic medical conditions, medication management and review of any available recent lab and radiology data. Recently had a pulmonary embolus which was near fatal, he reportedly collapsed and became unresponsive at hoe, and 911 took him to the hospital Preventive health is updated, specifically  Cancer screening and Immunization.   Questions or concerns regarding consultations or procedures which the PT has had in the interim are  addressed. The PT denies any adverse reactions to current medications since the last visit.       Review of Systems Denies recent fever or chills. Denies sinus pressure, nasal congestion, ear pain or sore throat. Denies chest congestion, productive cough or wheezing. Denies chest pains, palpitations and leg swelling Denies abdominal pain, nausea, vomiting,diarrhea or constipation.   Chronic indwelling foley catherter Chronic  joint pain,  and limitation in mobility. Denies headaches, seizures, numbness, or tingling. Reports improvement  In  Depression and  anxiety less tearful C/o rectal ulcer        Objective:   Physical Exam Patient alert and oriented and in no cardiopulmonary distress.looks improved as compared to last visit  HEENT: No facial asymmetry, EOMI, no sinus tenderness,  oropharynx pink and moist.  Neck  Decreased ROM, no adenopathy.  Chest: Clear to auscultation bilaterally.decreasedair entry bilaterally  CVS: S1, S2 no murmurs, no S3.  ABD: Soft non tender. Bowel sounds normal.  Ext: No edema  MS: decreased  ROM spine, shoulders, hips and knees.  Skin: Intact, unable to examine sacrum  Psych: Good eye contact, normal affect. Memory loss not anxious or depressed appearing.  CNS: CN 2-12 intact, power, tone and sensation normal throughout.          Assessment & Plan:

## 2011-07-20 NOTE — Assessment & Plan Note (Signed)
Coumadin management through coumadin clinic

## 2011-07-20 NOTE — Assessment & Plan Note (Signed)
Also diagnosed in July, continues on Coumadin.

## 2011-07-20 NOTE — Assessment & Plan Note (Signed)
Diagnosed in July following episode of syncope, significantly elevated d-dimer level with indeterminate V/Q scan, also RV dysfunction by echocardiogram. Clinically patient has improved, continues on Coumadin in our Coumadin clinic. Anticipate at least a 6 month course of treatment, under the direction of Dr. Lodema Hong.

## 2011-07-20 NOTE — Assessment & Plan Note (Signed)
Stable , contoinue med

## 2011-07-22 ENCOUNTER — Ambulatory Visit (INDEPENDENT_AMBULATORY_CARE_PROVIDER_SITE_OTHER): Payer: Self-pay | Admitting: *Deleted

## 2011-07-22 DIAGNOSIS — R0989 Other specified symptoms and signs involving the circulatory and respiratory systems: Secondary | ICD-10-CM

## 2011-07-22 LAB — POCT INR: INR: 1.4

## 2011-07-23 ENCOUNTER — Telehealth: Payer: Self-pay | Admitting: Cardiology

## 2011-07-23 NOTE — Telephone Encounter (Signed)
Patient needs RX for Coumadin sent to [facility] / pls call patient with any questions / tg

## 2011-07-26 ENCOUNTER — Ambulatory Visit (INDEPENDENT_AMBULATORY_CARE_PROVIDER_SITE_OTHER): Payer: Medicare Other | Admitting: *Deleted

## 2011-07-26 DIAGNOSIS — R0989 Other specified symptoms and signs involving the circulatory and respiratory systems: Secondary | ICD-10-CM

## 2011-07-26 LAB — PROTIME-INR: INR: 1.8 — AB (ref 0.9–1.1)

## 2011-07-26 MED ORDER — WARFARIN SODIUM 5 MG PO TABS: ORAL_TABLET | ORAL | Status: DC

## 2011-07-29 ENCOUNTER — Ambulatory Visit (INDEPENDENT_AMBULATORY_CARE_PROVIDER_SITE_OTHER): Payer: Self-pay | Admitting: *Deleted

## 2011-07-29 DIAGNOSIS — R0989 Other specified symptoms and signs involving the circulatory and respiratory systems: Secondary | ICD-10-CM

## 2011-07-29 LAB — POCT INR: INR: 2

## 2011-08-03 ENCOUNTER — Ambulatory Visit (INDEPENDENT_AMBULATORY_CARE_PROVIDER_SITE_OTHER): Payer: Medicare Other | Admitting: Family Medicine

## 2011-08-03 ENCOUNTER — Encounter: Payer: Self-pay | Admitting: Family Medicine

## 2011-08-03 VITALS — BP 102/62 | HR 68 | Resp 16

## 2011-08-03 DIAGNOSIS — M109 Gout, unspecified: Secondary | ICD-10-CM

## 2011-08-03 DIAGNOSIS — R5383 Other fatigue: Secondary | ICD-10-CM

## 2011-08-03 DIAGNOSIS — N390 Urinary tract infection, site not specified: Secondary | ICD-10-CM

## 2011-08-03 DIAGNOSIS — I10 Essential (primary) hypertension: Secondary | ICD-10-CM

## 2011-08-03 DIAGNOSIS — Z23 Encounter for immunization: Secondary | ICD-10-CM

## 2011-08-03 DIAGNOSIS — E119 Type 2 diabetes mellitus without complications: Secondary | ICD-10-CM

## 2011-08-03 DIAGNOSIS — F0390 Unspecified dementia without behavioral disturbance: Secondary | ICD-10-CM

## 2011-08-03 DIAGNOSIS — M1990 Unspecified osteoarthritis, unspecified site: Secondary | ICD-10-CM

## 2011-08-03 DIAGNOSIS — M129 Arthropathy, unspecified: Secondary | ICD-10-CM

## 2011-08-03 DIAGNOSIS — R5381 Other malaise: Secondary | ICD-10-CM

## 2011-08-03 DIAGNOSIS — E669 Obesity, unspecified: Secondary | ICD-10-CM

## 2011-08-03 LAB — POCT URINALYSIS DIPSTICK
Glucose, UA: NEGATIVE
Spec Grav, UA: 1.01
Urobilinogen, UA: 0.2

## 2011-08-03 MED ORDER — AMPICILLIN 500 MG PO CAPS: 500.0000 mg | ORAL_CAPSULE | Freq: Four times a day (QID) | ORAL | Status: DC

## 2011-08-03 MED ORDER — PREDNISONE (PAK) 5 MG PO TABS: 5.0000 mg | ORAL_TABLET | ORAL | Status: AC

## 2011-08-03 MED ORDER — DONEPEZIL HCL 10 MG PO TABS: 10.0000 mg | ORAL_TABLET | Freq: Every day | ORAL | Status: DC

## 2011-08-03 NOTE — Patient Instructions (Signed)
F/u in 2 months.  You are having a flare of arthritis affecting shoulder and elbows and hands. I recommend steroid, will check with the coumadin clinic to make them aware.  Pls stop Blood pressure med, HCTZ you do not need this.   Urine will be checked , if infected I will treat.  Lab by home health nurse this week, chem 7and uric acid level.   HBA1C in 2 months , before OV

## 2011-08-03 NOTE — Progress Notes (Signed)
  Subjective:    Patient ID: Samuel Ryan, male    DOB: 1931-01-01, 77 y.o.   MRN: [id number]  HPI 4 day h/o swelling and stiffness in shoulders, elbows and hands, no aggravating or relieving factors noted Has been c/o increased b;adder spasm x 1 week, next urology appt is 9/26. Denies fever or chills   Review of Systems See HPI Denies recent fever or chills. Denies sinus pressure, nasal congestion, ear pain or sore throat. Denies chest congestion, productive cough or wheezing. Denies chest pains, palpitations and leg swelling Denies nausea, vomiting,diarrhea or constipation.  C/o lower abdominal spasm Denies dysuria, frequency, hesitancy or incontinence. C/o generalized  joint pain, stiffness and limitation in mobility.Agffecting shoulders, elbows, hips and knees Denies headaches, seizures, numbness, or tingling. C/o mild depression, states he is unable to be intimate with his wife and this bothers him Denies skin break down or rash.        Objective:   Physical Exam Patient alert and oriented and in no cardiopulmonary distress.  HEENT: No facial asymmetry, EOMI, no sinus tenderness,  oropharynx pink and moist.  Neck decreased ROM no adenopathy.  Chest: Clear to auscultation bilaterally.Decreased air entry throughout  CVS: S1, S2 no murmurs, no S3.  ABD: Soft positive suprapubic tenderness. Bowel sounds normal.  Ext: No edema  WU:[id number] ROM spine, shoulders, hips and knees.  Skin: Intact, no ulcerations or rash noted.  Psych: Good eye contact, labile affect. Memory impaired mildly anxious and  depressed appearing.  CNS: CN 2-12 intact, power, tone and sensation normal throughout.        Assessment & Plan:

## 2011-08-04 LAB — MICROALBUMIN / CREATININE URINE RATIO: Creatinine, Urine: 102.9 mg/dL

## 2011-08-05 ENCOUNTER — Ambulatory Visit (INDEPENDENT_AMBULATORY_CARE_PROVIDER_SITE_OTHER): Payer: Self-pay | Admitting: *Deleted

## 2011-08-05 DIAGNOSIS — R0989 Other specified symptoms and signs involving the circulatory and respiratory systems: Secondary | ICD-10-CM

## 2011-08-05 LAB — POCT INR: INR: 2.6

## 2011-08-07 LAB — URINE CULTURE

## 2011-08-08 NOTE — Assessment & Plan Note (Signed)
Improved control with weight loss from recent illness, and reduced food intake

## 2011-08-08 NOTE — Assessment & Plan Note (Signed)
Over corrected, pt advised to stop hCTZ

## 2011-08-08 NOTE — Assessment & Plan Note (Signed)
Symptomatic with infected urine, will treat with ampicillin until c/s available

## 2011-08-09 ENCOUNTER — Ambulatory Visit (INDEPENDENT_AMBULATORY_CARE_PROVIDER_SITE_OTHER): Payer: Self-pay | Admitting: *Deleted

## 2011-08-09 ENCOUNTER — Other Ambulatory Visit: Payer: Self-pay | Admitting: Family Medicine

## 2011-08-09 DIAGNOSIS — R0989 Other specified symptoms and signs involving the circulatory and respiratory systems: Secondary | ICD-10-CM

## 2011-08-09 LAB — POCT INR: INR: 2.5

## 2011-08-09 MED ORDER — CIPROFLOXACIN HCL 500 MG PO TABS: 500.0000 mg | ORAL_TABLET | Freq: Two times a day (BID) | ORAL | Status: AC

## 2011-08-09 NOTE — Progress Notes (Signed)
done

## 2011-08-13 ENCOUNTER — Ambulatory Visit (INDEPENDENT_AMBULATORY_CARE_PROVIDER_SITE_OTHER): Payer: Self-pay | Admitting: *Deleted

## 2011-08-13 DIAGNOSIS — R0989 Other specified symptoms and signs involving the circulatory and respiratory systems: Secondary | ICD-10-CM

## 2011-08-16 ENCOUNTER — Ambulatory Visit: Payer: Medicare Other | Admitting: Family Medicine

## 2011-08-17 ENCOUNTER — Ambulatory Visit (INDEPENDENT_AMBULATORY_CARE_PROVIDER_SITE_OTHER): Payer: Self-pay | Admitting: *Deleted

## 2011-08-17 ENCOUNTER — Telehealth: Payer: Self-pay | Admitting: Family Medicine

## 2011-08-17 DIAGNOSIS — R0989 Other specified symptoms and signs involving the circulatory and respiratory systems: Secondary | ICD-10-CM

## 2011-08-17 LAB — POCT INR: INR: 2.6

## 2011-08-18 ENCOUNTER — Encounter: Payer: Self-pay | Admitting: Family Medicine

## 2011-08-18 NOTE — Telephone Encounter (Signed)
States his feet have been swelling on and off since last visit. The nurse has been checking his BP and it has been great. He is just having the swelling in his feet. Also he said he can only sleep for a few minutes at a time before he's waking up. Wants something to help him sleep.  The drug store [place]

## 2011-08-18 NOTE — Telephone Encounter (Signed)
I am aware of the feet, and advised at the visit to keep them elevated. Also to keep salt intake down. For sleep review good sleep hygiene with him, and I advise benadryl oTC 1 at bedtime

## 2011-08-18 NOTE — Telephone Encounter (Signed)
Patient aware.

## 2011-08-24 ENCOUNTER — Other Ambulatory Visit: Payer: Self-pay

## 2011-08-24 MED ORDER — SAXAGLIPTIN HCL 5 MG PO TABS: ORAL_TABLET | ORAL | Status: DC

## 2011-08-26 ENCOUNTER — Ambulatory Visit (INDEPENDENT_AMBULATORY_CARE_PROVIDER_SITE_OTHER): Payer: Self-pay | Admitting: *Deleted

## 2011-08-26 DIAGNOSIS — R0989 Other specified symptoms and signs involving the circulatory and respiratory systems: Secondary | ICD-10-CM

## 2011-08-26 LAB — POCT INR: INR: 2.6

## 2011-08-31 ENCOUNTER — Ambulatory Visit (INDEPENDENT_AMBULATORY_CARE_PROVIDER_SITE_OTHER): Payer: Self-pay | Admitting: *Deleted

## 2011-08-31 DIAGNOSIS — R0989 Other specified symptoms and signs involving the circulatory and respiratory systems: Secondary | ICD-10-CM

## 2011-08-31 LAB — POCT INR: INR: 2.2

## 2011-09-01 ENCOUNTER — Telehealth: Payer: Self-pay | Admitting: Family Medicine

## 2011-09-07 ENCOUNTER — Ambulatory Visit (INDEPENDENT_AMBULATORY_CARE_PROVIDER_SITE_OTHER): Payer: Self-pay | Admitting: *Deleted

## 2011-09-07 DIAGNOSIS — R0989 Other specified symptoms and signs involving the circulatory and respiratory systems: Secondary | ICD-10-CM

## 2011-09-07 NOTE — Telephone Encounter (Signed)
Patient states he is feeling better

## 2011-09-16 ENCOUNTER — Telehealth: Payer: Self-pay | Admitting: Family Medicine

## 2011-09-16 ENCOUNTER — Ambulatory Visit (INDEPENDENT_AMBULATORY_CARE_PROVIDER_SITE_OTHER): Payer: Self-pay | Admitting: *Deleted

## 2011-09-16 DIAGNOSIS — I824Y9 Acute embolism and thrombosis of unspecified deep veins of unspecified proximal lower extremity: Secondary | ICD-10-CM

## 2011-09-16 DIAGNOSIS — R0989 Other specified symptoms and signs involving the circulatory and respiratory systems: Secondary | ICD-10-CM

## 2011-09-16 DIAGNOSIS — I2699 Other pulmonary embolism without acute cor pulmonale: Secondary | ICD-10-CM

## 2011-09-16 DIAGNOSIS — Z7901 Long term (current) use of anticoagulants: Secondary | ICD-10-CM

## 2011-09-24 NOTE — Telephone Encounter (Signed)
Called patient left message

## 2011-09-27 NOTE — Telephone Encounter (Signed)
Jaime spoke with patient-

## 2011-09-30 ENCOUNTER — Ambulatory Visit (INDEPENDENT_AMBULATORY_CARE_PROVIDER_SITE_OTHER): Payer: Medicare Other | Admitting: *Deleted

## 2011-09-30 DIAGNOSIS — Z7901 Long term (current) use of anticoagulants: Secondary | ICD-10-CM

## 2011-09-30 DIAGNOSIS — I2699 Other pulmonary embolism without acute cor pulmonale: Secondary | ICD-10-CM

## 2011-09-30 DIAGNOSIS — I824Y9 Acute embolism and thrombosis of unspecified deep veins of unspecified proximal lower extremity: Secondary | ICD-10-CM

## 2011-10-13 ENCOUNTER — Encounter: Payer: Self-pay | Admitting: Family Medicine

## 2011-10-14 ENCOUNTER — Ambulatory Visit (INDEPENDENT_AMBULATORY_CARE_PROVIDER_SITE_OTHER): Payer: Medicare Other | Admitting: Family Medicine

## 2011-10-14 ENCOUNTER — Encounter: Payer: Self-pay | Admitting: Family Medicine

## 2011-10-14 ENCOUNTER — Ambulatory Visit (INDEPENDENT_AMBULATORY_CARE_PROVIDER_SITE_OTHER): Payer: Medicare Other | Admitting: *Deleted

## 2011-10-14 VITALS — BP 138/86 | HR 62 | Resp 16 | Ht 70.0 in | Wt 235.0 lb

## 2011-10-14 DIAGNOSIS — F068 Other specified mental disorders due to known physiological condition: Secondary | ICD-10-CM

## 2011-10-14 DIAGNOSIS — I824Y9 Acute embolism and thrombosis of unspecified deep veins of unspecified proximal lower extremity: Secondary | ICD-10-CM

## 2011-10-14 DIAGNOSIS — Z23 Encounter for immunization: Secondary | ICD-10-CM

## 2011-10-14 DIAGNOSIS — I2699 Other pulmonary embolism without acute cor pulmonale: Secondary | ICD-10-CM

## 2011-10-14 DIAGNOSIS — Z7901 Long term (current) use of anticoagulants: Secondary | ICD-10-CM

## 2011-10-14 DIAGNOSIS — E119 Type 2 diabetes mellitus without complications: Secondary | ICD-10-CM

## 2011-10-14 DIAGNOSIS — M1990 Unspecified osteoarthritis, unspecified site: Secondary | ICD-10-CM

## 2011-10-14 DIAGNOSIS — I10 Essential (primary) hypertension: Secondary | ICD-10-CM

## 2011-10-14 DIAGNOSIS — L259 Unspecified contact dermatitis, unspecified cause: Secondary | ICD-10-CM

## 2011-10-14 DIAGNOSIS — L309 Dermatitis, unspecified: Secondary | ICD-10-CM | POA: Insufficient documentation

## 2011-10-14 DIAGNOSIS — M129 Arthropathy, unspecified: Secondary | ICD-10-CM

## 2011-10-14 LAB — POCT INR: INR: 2.2

## 2011-10-14 MED ORDER — BENAZEPRIL HCL 10 MG PO TABS: 10.0000 mg | ORAL_TABLET | Freq: Every day | ORAL | Status: DC

## 2011-10-14 MED ORDER — CLOBETASOL PROPIONATE 0.05 % EX CREA: 1.0000 "application " | TOPICAL_CREAM | Freq: Two times a day (BID) | CUTANEOUS | Status: DC

## 2011-10-14 MED ORDER — TRAMADOL HCL 50 MG PO TABS
50.0000 mg | ORAL_TABLET | Freq: Two times a day (BID) | ORAL | Status: DC | PRN
Start: 2011-10-14 — End: 2011-10-29

## 2011-10-14 NOTE — Patient Instructions (Signed)
F/u in 2 months. Flu vaccine today.  HBA1C and chem 7 today.  New med for blood pressure which is elevated, benazepril 10mg  one daily.  New med tramadol for arthritis pain  Cream for eczema is sent in   All the best, keep well , call if you need me

## 2011-10-14 NOTE — Assessment & Plan Note (Addendum)
Check HBa1C today, uncontrolled, increase in HBa1C, add metformin

## 2011-10-14 NOTE — Progress Notes (Signed)
  Subjective:    Patient ID: Samuel Ryan, male    DOB: 04-10-1931, 77 y.o.   MRN: [id number]  HPI The PT is here for follow up and re-evaluation of chronic medical conditions, medication management and review of any available recent lab and radiology data.  Preventive health is updated, specifically  Cancer screening and Immunization.   Questions or concerns regarding consultations or procedures which the PT has had in the interim are  addressed. The PT denies any adverse reactions to current medications since the last visit.  There are no new concerns. Now able to ambulate with a walker which is excellent C/o uncontrolled arthritic pain    Review of Systems See HPI Denies recent fever or chills. Denies sinus pressure, nasal congestion, ear pain or sore throat. Denies chest congestion, productive cough or wheezing. Denies chest pains, palpitations and leg swelling Denies abdominal pain, nausea, vomiting,diarrhea or constipation.   Denies dysuria, frequency, hesitancy or incontinence. Denies headaches, seizures, numbness, or tingling. Denies depression, anxiety or insomnia. Denies skin break down or rash.        Objective:   Physical Exam Patient alert and oriented and in no cardiopulmonary distress.  HEENT: No facial asymmetry, EOMI, no sinus tenderness,  oropharynx pink and moist.  Neck deacreased ROM no adenopathy.  Chest: Clear to auscultation bilaterally.  CVS: S1, S2 no murmurs, no S3.  ABD: Soft non tender. Bowel sounds normal.  Ext: No edema  [id number]  ROM spine, shoulders, hips and knees.  Skin: Intact, no ulcerations or rash noted.  Psych: Good eye contact,flat affect. Memory impaired not anxious or depressed appearing.  CNS: CN 2-12 intact, power, tone and sensation normal throughout.        Assessment & Plan:

## 2011-10-15 LAB — HEMOGLOBIN A1C: Hgb A1c MFr Bld: 7.7 % — ABNORMAL HIGH (ref <5.7)

## 2011-10-15 LAB — BASIC METABOLIC PANEL
BUN: 11 mg/dL (ref 6–23)
Creat: 1.27 mg/dL (ref 0.50–1.35)

## 2011-10-17 NOTE — Assessment & Plan Note (Signed)
C/o increased and uncontrolled pain esp affecting the hands, tramadol prescried

## 2011-10-17 NOTE — Assessment & Plan Note (Signed)
Steroid cream prescribed for as needed use

## 2011-10-17 NOTE — Assessment & Plan Note (Signed)
Uncontrolled, med added

## 2011-10-17 NOTE — Assessment & Plan Note (Signed)
Continue daily aricept 

## 2011-10-17 NOTE — Assessment & Plan Note (Signed)
Currently on coumadin, followed by the clinic

## 2011-10-18 ENCOUNTER — Other Ambulatory Visit: Payer: Self-pay

## 2011-10-18 DIAGNOSIS — E119 Type 2 diabetes mellitus without complications: Secondary | ICD-10-CM

## 2011-10-18 MED ORDER — METFORMIN HCL 500 MG PO TABS: 500.0000 mg | ORAL_TABLET | Freq: Two times a day (BID) | ORAL | Status: DC

## 2011-10-29 ENCOUNTER — Other Ambulatory Visit: Payer: Self-pay

## 2011-10-29 DIAGNOSIS — M129 Arthropathy, unspecified: Secondary | ICD-10-CM

## 2011-10-29 MED ORDER — TRAMADOL HCL 50 MG PO TABS: 50.0000 mg | ORAL_TABLET | Freq: Two times a day (BID) | ORAL | Status: DC | PRN

## 2011-10-29 MED ORDER — DONEPEZIL HCL 10 MG PO TABS: 10.0000 mg | ORAL_TABLET | Freq: Every day | ORAL | Status: DC

## 2011-10-29 MED ORDER — LOVASTATIN 40 MG PO TABS: 40.0000 mg | ORAL_TABLET | Freq: Every day | ORAL | Status: DC

## 2011-11-04 ENCOUNTER — Ambulatory Visit (INDEPENDENT_AMBULATORY_CARE_PROVIDER_SITE_OTHER): Payer: Medicare Other | Admitting: *Deleted

## 2011-11-04 DIAGNOSIS — I824Y9 Acute embolism and thrombosis of unspecified deep veins of unspecified proximal lower extremity: Secondary | ICD-10-CM

## 2011-11-04 DIAGNOSIS — I2699 Other pulmonary embolism without acute cor pulmonale: Secondary | ICD-10-CM

## 2011-11-04 DIAGNOSIS — Z7901 Long term (current) use of anticoagulants: Secondary | ICD-10-CM

## 2011-11-04 LAB — POCT INR: INR: 2.1

## 2011-12-01 ENCOUNTER — Encounter: Payer: Self-pay | Admitting: Family Medicine

## 2011-12-02 ENCOUNTER — Ambulatory Visit (INDEPENDENT_AMBULATORY_CARE_PROVIDER_SITE_OTHER): Payer: Medicare Other | Admitting: *Deleted

## 2011-12-02 DIAGNOSIS — I2699 Other pulmonary embolism without acute cor pulmonale: Secondary | ICD-10-CM

## 2011-12-02 DIAGNOSIS — I824Y9 Acute embolism and thrombosis of unspecified deep veins of unspecified proximal lower extremity: Secondary | ICD-10-CM

## 2011-12-02 DIAGNOSIS — Z7901 Long term (current) use of anticoagulants: Secondary | ICD-10-CM

## 2011-12-09 ENCOUNTER — Ambulatory Visit: Payer: Medicare Other | Admitting: Family Medicine

## 2011-12-10 ENCOUNTER — Other Ambulatory Visit: Payer: Self-pay | Admitting: *Deleted

## 2011-12-10 ENCOUNTER — Ambulatory Visit: Payer: Medicare Other | Admitting: Family Medicine

## 2011-12-10 MED ORDER — WARFARIN SODIUM 5 MG PO TABS: ORAL_TABLET | ORAL | Status: DC

## 2011-12-13 ENCOUNTER — Other Ambulatory Visit: Payer: Self-pay | Admitting: *Deleted

## 2011-12-13 MED ORDER — WARFARIN SODIUM 5 MG PO TABS: ORAL_TABLET | ORAL | Status: DC

## 2011-12-15 ENCOUNTER — Encounter: Payer: Self-pay | Admitting: Family Medicine

## 2011-12-16 ENCOUNTER — Encounter: Payer: Medicare Other | Admitting: *Deleted

## 2011-12-16 ENCOUNTER — Ambulatory Visit: Payer: Medicare Other | Admitting: Family Medicine

## 2011-12-20 ENCOUNTER — Other Ambulatory Visit: Payer: Self-pay

## 2011-12-20 ENCOUNTER — Telehealth: Payer: Self-pay | Admitting: Family Medicine

## 2011-12-20 ENCOUNTER — Ambulatory Visit (INDEPENDENT_AMBULATORY_CARE_PROVIDER_SITE_OTHER): Payer: Medicare Other | Admitting: *Deleted

## 2011-12-20 DIAGNOSIS — Z7901 Long term (current) use of anticoagulants: Secondary | ICD-10-CM

## 2011-12-20 DIAGNOSIS — I2699 Other pulmonary embolism without acute cor pulmonale: Secondary | ICD-10-CM

## 2011-12-20 DIAGNOSIS — I824Y9 Acute embolism and thrombosis of unspecified deep veins of unspecified proximal lower extremity: Secondary | ICD-10-CM

## 2011-12-20 LAB — POCT INR: INR: 2.3

## 2011-12-20 MED ORDER — GLUCOSE BLOOD VI STRP: ORAL_STRIP | Status: DC

## 2011-12-20 NOTE — Telephone Encounter (Signed)
Sent in test strips to requested pharmacy

## 2011-12-29 ENCOUNTER — Encounter: Payer: Self-pay | Admitting: Family Medicine

## 2011-12-29 ENCOUNTER — Ambulatory Visit (INDEPENDENT_AMBULATORY_CARE_PROVIDER_SITE_OTHER): Payer: Medicare Other | Admitting: Family Medicine

## 2011-12-29 VITALS — BP 130/80 | HR 82 | Resp 16 | Ht 70.0 in | Wt 230.1 lb

## 2011-12-29 DIAGNOSIS — E782 Mixed hyperlipidemia: Secondary | ICD-10-CM

## 2011-12-29 DIAGNOSIS — I10 Essential (primary) hypertension: Secondary | ICD-10-CM

## 2011-12-29 DIAGNOSIS — E119 Type 2 diabetes mellitus without complications: Secondary | ICD-10-CM

## 2011-12-29 DIAGNOSIS — I2699 Other pulmonary embolism without acute cor pulmonale: Secondary | ICD-10-CM

## 2011-12-29 DIAGNOSIS — F068 Other specified mental disorders due to known physiological condition: Secondary | ICD-10-CM

## 2011-12-29 NOTE — Assessment & Plan Note (Signed)
Continue monotherapy at this time

## 2011-12-29 NOTE — Assessment & Plan Note (Signed)
Labs due next draw

## 2011-12-29 NOTE — Assessment & Plan Note (Signed)
Controlled, no change in medication  

## 2011-12-29 NOTE — Patient Instructions (Signed)
F/U in mid March.  Fasting lipid, cmp and EGFR, HBA1C, cbc tsh  In March before visit  We will call for onglyza for you

## 2012-01-02 NOTE — Assessment & Plan Note (Signed)
Uncontrolled, requests help with medication which I will procure from pharma rep, onglyza

## 2012-01-02 NOTE — Assessment & Plan Note (Signed)
Maintained on chronic coumadin 

## 2012-01-02 NOTE — Progress Notes (Signed)
  Subjective:    Patient ID: Samuel Ryan, male    DOB: 11-20-1931, 78 y.o.   MRN: [id number]  HPI The PT is here for follow up and re-evaluation of chronic medical conditions, medication management and review of any available recent lab and radiology data.  Preventive health is updated, specifically  Cancer screening and Immunization.   Questions or concerns regarding consultations or procedures which the PT has had in the interim are  addressed. The PT denies any adverse reactions to current medications since the last visit.  C/o increased and uncontrolled blood sugars, unable to afford onglyza and asking for help      Review of Systems .  See HPI Denies recent fever or chills. Denies sinus pressure, nasal congestion, ear pain or sore throat. Denies chest congestion, productive cough or wheezing. Denies chest pains, palpitations and leg swelling Denies abdominal pain, nausea, vomiting,diarrhea or constipation.   Denies dysuria, frequency, hesitancy or incontinence. Chronic back and knee pain with limitation in mobility Denies headaches, seizures, numbness, or tingling. Denies depression, anxiety or insomnia.c/o memory loss Denies skin break down or rash.     Objective:   Physical Exam Patient alert  and in no cardiopulmonary distress.  HEENT: No facial asymmetry, EOMI, no sinus tenderness,  oropharynx pink and moist.  Neck decreased ROM, no adenopathy.  Chest: Clear to auscultation bilaterally.  CVS: S1, S2 no murmurs, no S3.  ABD: Soft non tender. Bowel sounds normal.  Ext: No edema  GE:[id number]   ROM spine, shoulders, hips and knees.  Skin: Intact, no ulcerations or rash noted.  Psych: Good eye contact, normal affect. Memory loss, not anxious or depressed appearing.  CNS: CN 2-12 intact, power,  normal throughout.        Assessment & Plan:

## 2012-01-05 ENCOUNTER — Other Ambulatory Visit: Payer: Self-pay

## 2012-01-05 DIAGNOSIS — M129 Arthropathy, unspecified: Secondary | ICD-10-CM

## 2012-01-05 MED ORDER — TRAMADOL HCL 50 MG PO TABS: 50.0000 mg | ORAL_TABLET | Freq: Two times a day (BID) | ORAL | Status: DC | PRN

## 2012-01-10 ENCOUNTER — Other Ambulatory Visit: Payer: Self-pay | Admitting: *Deleted

## 2012-01-10 ENCOUNTER — Ambulatory Visit (INDEPENDENT_AMBULATORY_CARE_PROVIDER_SITE_OTHER): Payer: Medicare Other | Admitting: *Deleted

## 2012-01-10 DIAGNOSIS — I2699 Other pulmonary embolism without acute cor pulmonale: Secondary | ICD-10-CM

## 2012-01-10 DIAGNOSIS — Z7901 Long term (current) use of anticoagulants: Secondary | ICD-10-CM

## 2012-01-10 DIAGNOSIS — I824Y9 Acute embolism and thrombosis of unspecified deep veins of unspecified proximal lower extremity: Secondary | ICD-10-CM

## 2012-01-10 MED ORDER — WARFARIN SODIUM 5 MG PO TABS: ORAL_TABLET | ORAL | Status: DC

## 2012-02-07 ENCOUNTER — Ambulatory Visit (INDEPENDENT_AMBULATORY_CARE_PROVIDER_SITE_OTHER): Payer: Medicare Other | Admitting: *Deleted

## 2012-02-07 DIAGNOSIS — I824Y9 Acute embolism and thrombosis of unspecified deep veins of unspecified proximal lower extremity: Secondary | ICD-10-CM

## 2012-02-07 DIAGNOSIS — I2699 Other pulmonary embolism without acute cor pulmonale: Secondary | ICD-10-CM

## 2012-02-07 DIAGNOSIS — Z7901 Long term (current) use of anticoagulants: Secondary | ICD-10-CM

## 2012-02-07 NOTE — Patient Instructions (Signed)
Continue same dose and return in 1 month.

## 2012-02-22 LAB — COMPLETE METABOLIC PANEL WITH GFR
Alkaline Phosphatase: 48 U/L (ref 39–117)
GFR, Est Non African American: 53 mL/min — ABNORMAL LOW
Glucose, Bld: 91 mg/dL (ref 70–99)
Sodium: 143 mEq/L (ref 135–145)
Total Bilirubin: 0.8 mg/dL (ref 0.3–1.2)
Total Protein: 6.5 g/dL (ref 6.0–8.3)

## 2012-02-22 LAB — CBC WITH DIFFERENTIAL/PLATELET
Lymphocytes Relative: 37 % (ref 12–46)
Lymphs Abs: 1.6 10*3/uL (ref 0.7–4.0)
Neutro Abs: 2.2 10*3/uL (ref 1.7–7.7)
Neutrophils Relative %: 51 % (ref 43–77)
Platelets: 178 10*3/uL (ref 150–400)
RBC: 3.98 MIL/uL — ABNORMAL LOW (ref 4.22–5.81)
WBC: 4.4 10*3/uL (ref 4.0–10.5)

## 2012-02-22 LAB — LIPID PANEL
HDL: 42 mg/dL (ref >39)
Total CHOL/HDL Ratio: 3.5 Ratio

## 2012-02-22 LAB — TSH: TSH: 0.571 u[IU]/mL (ref 0.350–4.500)

## 2012-02-22 LAB — HEMOGLOBIN A1C
Hgb A1c MFr Bld: 6.2 % — ABNORMAL HIGH (ref <5.7)
Mean Plasma Glucose: 131 mg/dL — ABNORMAL HIGH (ref <117)

## 2012-02-24 ENCOUNTER — Encounter: Payer: Self-pay | Admitting: Family Medicine

## 2012-02-24 ENCOUNTER — Ambulatory Visit (INDEPENDENT_AMBULATORY_CARE_PROVIDER_SITE_OTHER): Payer: Medicare Other | Admitting: Family Medicine

## 2012-02-24 ENCOUNTER — Other Ambulatory Visit: Payer: Self-pay

## 2012-02-24 VITALS — BP 118/76 | HR 86 | Resp 16 | Ht 70.0 in | Wt 235.0 lb

## 2012-02-24 DIAGNOSIS — E119 Type 2 diabetes mellitus without complications: Secondary | ICD-10-CM

## 2012-02-24 DIAGNOSIS — I10 Essential (primary) hypertension: Secondary | ICD-10-CM

## 2012-02-24 DIAGNOSIS — E782 Mixed hyperlipidemia: Secondary | ICD-10-CM

## 2012-02-24 DIAGNOSIS — M129 Arthropathy, unspecified: Secondary | ICD-10-CM

## 2012-02-24 DIAGNOSIS — F068 Other specified mental disorders due to known physiological condition: Secondary | ICD-10-CM

## 2012-02-24 MED ORDER — LOVASTATIN 40 MG PO TABS: 40.0000 mg | ORAL_TABLET | Freq: Every day | ORAL | Status: DC

## 2012-02-24 MED ORDER — TRAMADOL HCL 50 MG PO TABS: 50.0000 mg | ORAL_TABLET | Freq: Two times a day (BID) | ORAL | Status: DC | PRN

## 2012-02-24 MED ORDER — FLUTICASONE PROPIONATE 50 MCG/ACT NA SUSP: 2.0000 | Freq: Every day | NASAL | Status: DC

## 2012-02-24 MED ORDER — DONEPEZIL HCL 10 MG PO TABS: 10.0000 mg | ORAL_TABLET | Freq: Every day | ORAL | Status: DC

## 2012-02-24 NOTE — Assessment & Plan Note (Signed)
Stable, no med changes at this time

## 2012-02-24 NOTE — Patient Instructions (Signed)
F/u in 4 month   Excellent labs and blood pressure, no med change   You need eye exam every year   hBA1c in 4 month, non fasting

## 2012-02-24 NOTE — Progress Notes (Signed)
  Subjective:    Patient ID: Samuel Ryan, male    DOB: 21-Mar-1931, 78 y.o.   MRN: [id number]  HPI The PT is here for follow up and re-evaluation of chronic medical conditions, medication management and review of any available recent lab and radiology data.  Preventive health is updated, specifically  Cancer screening and Immunization.   Questions or concerns regarding consultations or procedures which the PT has had in the interim are  addressed. The PT denies any adverse reactions to current medications since the last visit.  C/o increased allergy symptoms, stuffy nose with clear drainage There are no specific complaints       Review of Systems See HPI Denies recent fever or chills. Denies sinus pressure, nasal congestion, ear pain or sore throat. Denies chest congestion, productive cough or wheezing. Denies chest pains, palpitations and leg swelling Denies abdominal pain, nausea, vomiting,diarrhea or constipation.   Denies dysuria, frequency, hesitancy or incontinence. Chronic  joint pain, and stiffness,with  limitation in mobility. Denies headaches, seizures, numbness, or tingling. Denies depression, anxiety or insomnia. Denies skin break down or rash.        Objective:   Physical Exam  Patient alert and oriented and in no cardiopulmonary distress.  HEENT: No facial asymmetry, EOMI, no sinus tenderness,  oropharynx pink and moist.  Neck supple no adenopathy.  Chest: Clear to auscultation bilaterally.  CVS: S1, S2 no murmurs, no S3.  ABD: Soft non tender. Bowel sounds normal.  Ext: No edema  MS: decreased  ROM spine, shoulders, hips and knees.  Skin: Intact, no ulcerations or rash noted.  Psych: Good eye contact, normal affect. Memory loss not anxious or depressed appearing.  CNS: CN 2-12 intact, power, tone and sensation normal throughout.       Assessment & Plan:

## 2012-02-24 NOTE — Assessment & Plan Note (Signed)
Controlled, no change in medication  

## 2012-03-06 ENCOUNTER — Ambulatory Visit (INDEPENDENT_AMBULATORY_CARE_PROVIDER_SITE_OTHER): Payer: Medicare Other | Admitting: *Deleted

## 2012-03-06 DIAGNOSIS — Z7901 Long term (current) use of anticoagulants: Secondary | ICD-10-CM

## 2012-03-06 DIAGNOSIS — I2699 Other pulmonary embolism without acute cor pulmonale: Secondary | ICD-10-CM

## 2012-03-06 LAB — POCT INR: INR: 2.2

## 2012-03-13 ENCOUNTER — Other Ambulatory Visit: Payer: Self-pay | Admitting: *Deleted

## 2012-03-13 MED ORDER — WARFARIN SODIUM 5 MG PO TABS: ORAL_TABLET | ORAL | Status: DC

## 2012-04-17 ENCOUNTER — Ambulatory Visit (INDEPENDENT_AMBULATORY_CARE_PROVIDER_SITE_OTHER): Payer: Medicare Other | Admitting: *Deleted

## 2012-04-17 DIAGNOSIS — I824Y9 Acute embolism and thrombosis of unspecified deep veins of unspecified proximal lower extremity: Secondary | ICD-10-CM

## 2012-04-17 DIAGNOSIS — I2699 Other pulmonary embolism without acute cor pulmonale: Secondary | ICD-10-CM

## 2012-04-17 DIAGNOSIS — Z7901 Long term (current) use of anticoagulants: Secondary | ICD-10-CM

## 2012-04-17 LAB — POCT INR: INR: 3.6

## 2012-05-15 ENCOUNTER — Ambulatory Visit (INDEPENDENT_AMBULATORY_CARE_PROVIDER_SITE_OTHER): Payer: Medicare Other | Admitting: *Deleted

## 2012-05-15 DIAGNOSIS — I824Y9 Acute embolism and thrombosis of unspecified deep veins of unspecified proximal lower extremity: Secondary | ICD-10-CM

## 2012-05-15 DIAGNOSIS — Z7901 Long term (current) use of anticoagulants: Secondary | ICD-10-CM

## 2012-05-15 DIAGNOSIS — I2699 Other pulmonary embolism without acute cor pulmonale: Secondary | ICD-10-CM

## 2012-05-15 LAB — POCT INR: INR: 2.7

## 2012-06-08 ENCOUNTER — Other Ambulatory Visit: Payer: Self-pay | Admitting: Cardiology

## 2012-06-08 MED ORDER — WARFARIN SODIUM 5 MG PO TABS: ORAL_TABLET | ORAL | Status: DC

## 2012-06-12 ENCOUNTER — Other Ambulatory Visit: Payer: Self-pay

## 2012-06-12 ENCOUNTER — Ambulatory Visit (INDEPENDENT_AMBULATORY_CARE_PROVIDER_SITE_OTHER): Payer: Medicare Other | Admitting: *Deleted

## 2012-06-12 DIAGNOSIS — Z7901 Long term (current) use of anticoagulants: Secondary | ICD-10-CM

## 2012-06-12 DIAGNOSIS — I824Y9 Acute embolism and thrombosis of unspecified deep veins of unspecified proximal lower extremity: Secondary | ICD-10-CM

## 2012-06-12 DIAGNOSIS — L309 Dermatitis, unspecified: Secondary | ICD-10-CM

## 2012-06-12 DIAGNOSIS — I2699 Other pulmonary embolism without acute cor pulmonale: Secondary | ICD-10-CM

## 2012-06-12 LAB — POCT INR: INR: 3.7

## 2012-06-12 MED ORDER — SILODOSIN 8 MG PO CAPS: 8.0000 mg | ORAL_CAPSULE | Freq: Every day | ORAL | Status: DC

## 2012-06-12 MED ORDER — CLOBETASOL PROPIONATE 0.05 % EX CREA: 1.0000 "application " | TOPICAL_CREAM | Freq: Two times a day (BID) | CUTANEOUS | Status: DC

## 2012-06-28 ENCOUNTER — Ambulatory Visit (INDEPENDENT_AMBULATORY_CARE_PROVIDER_SITE_OTHER): Payer: Medicare Other | Admitting: Family Medicine

## 2012-06-28 VITALS — BP 120/80 | HR 78 | Resp 14 | Wt 233.0 lb

## 2012-06-28 DIAGNOSIS — I10 Essential (primary) hypertension: Secondary | ICD-10-CM

## 2012-06-28 DIAGNOSIS — Z1211 Encounter for screening for malignant neoplasm of colon: Secondary | ICD-10-CM

## 2012-06-28 DIAGNOSIS — M129 Arthropathy, unspecified: Secondary | ICD-10-CM

## 2012-06-28 DIAGNOSIS — E782 Mixed hyperlipidemia: Secondary | ICD-10-CM

## 2012-06-28 DIAGNOSIS — E119 Type 2 diabetes mellitus without complications: Secondary | ICD-10-CM

## 2012-06-28 DIAGNOSIS — M6530 Trigger finger, unspecified finger: Secondary | ICD-10-CM

## 2012-06-28 MED ORDER — TRAMADOL HCL 50 MG PO TABS: 50.0000 mg | ORAL_TABLET | Freq: Two times a day (BID) | ORAL | Status: DC

## 2012-06-28 NOTE — Patient Instructions (Addendum)
Annual wellness in December.Please call if you need me before  You are referred for colonoscopy.  Your blood sugar and blood pressure are good, no changes in medication.  Fasting lipid, cmp and EgFr, hBa1C  \  You will have tramadol twice daily for pain, please call if you decide you need to see orthopedics for trigger finger

## 2012-06-28 NOTE — Progress Notes (Signed)
  Subjective:    Patient ID: Samuel Ryan, male    DOB: 05-05-1931, 78 y.o.   MRN: [id number]  HPI The PT is here for follow up and re-evaluation of chronic medical conditions, medication management and review of any available recent lab and radiology data.  Preventive health is updated, specifically  Cancer screening and Immunization.   Questions or concerns regarding consultations or procedures which the PT has had in the interim are  addressed. The PT denies any adverse reactions to current medications since the last visit.  1 month h/o triggering of index, middle and 4th finger, wants tramadol twice daily Noted 4 week h/o change in bowel  Movements frequency and pebbles, colonoscopy past due      Review of Systems See HPI Denies recent fever or chills. Denies sinus pressure, nasal congestion, ear pain or sore throat. Denies chest congestion, productive cough or wheezing. Denies chest pains, palpitations and leg swelling  Denies dysuria, frequency, hesitancy or incontinence. Denies headaches, seizures, numbness, or tingling. Denies depression, anxiety or insomnia. Denies skin break down or rash.        Objective:   Physical Exam Patient alert and oriented and in no cardiopulmonary distress.  HEENT: No facial asymmetry, EOMI, no sinus tenderness,  oropharynx pink and moist.  Neck decreased though adequate ROM,no adenopathy.  Chest: Clear to auscultation bilaterally.  CVS: S1, S2 no murmurs, no S3.  ABD: Soft non tender. Bowel sounds normal.  Ext: No edema  WU:[id number]  ROM spine, shoulders, hips and knees.Trigger fingers on right hand  Skin: Intact, no ulcerations or rash noted.  Psych: Good eye contact, normal affect. Memory impaired not anxious or depressed appearing.  CNS: CN 2-12 intact, power, tone and sensation normal throughout.        Assessment & Plan:

## 2012-06-30 ENCOUNTER — Encounter (INDEPENDENT_AMBULATORY_CARE_PROVIDER_SITE_OTHER): Payer: Self-pay | Admitting: *Deleted

## 2012-07-02 ENCOUNTER — Encounter: Payer: Self-pay | Admitting: Family Medicine

## 2012-07-02 DIAGNOSIS — M6530 Trigger finger, unspecified finger: Secondary | ICD-10-CM | POA: Insufficient documentation

## 2012-07-02 NOTE — Assessment & Plan Note (Signed)
Hyperlipidemia:Low fat diet discussed and encouraged.  Controlled, no change in medication   

## 2012-07-02 NOTE — Assessment & Plan Note (Signed)
Controlled, no change in medication  

## 2012-07-02 NOTE — Assessment & Plan Note (Signed)
Increased pain and debility, pain management only at this time requested

## 2012-07-05 ENCOUNTER — Ambulatory Visit (INDEPENDENT_AMBULATORY_CARE_PROVIDER_SITE_OTHER): Payer: Medicare Other | Admitting: *Deleted

## 2012-07-05 DIAGNOSIS — I824Y9 Acute embolism and thrombosis of unspecified deep veins of unspecified proximal lower extremity: Secondary | ICD-10-CM

## 2012-07-05 DIAGNOSIS — I2699 Other pulmonary embolism without acute cor pulmonale: Secondary | ICD-10-CM

## 2012-07-05 DIAGNOSIS — Z7901 Long term (current) use of anticoagulants: Secondary | ICD-10-CM

## 2012-07-05 LAB — POCT INR: INR: 3.4

## 2012-07-06 ENCOUNTER — Encounter (INDEPENDENT_AMBULATORY_CARE_PROVIDER_SITE_OTHER): Payer: Self-pay | Admitting: *Deleted

## 2012-07-06 ENCOUNTER — Other Ambulatory Visit (INDEPENDENT_AMBULATORY_CARE_PROVIDER_SITE_OTHER): Payer: Self-pay | Admitting: *Deleted

## 2012-07-06 ENCOUNTER — Telehealth (INDEPENDENT_AMBULATORY_CARE_PROVIDER_SITE_OTHER): Payer: Self-pay | Admitting: *Deleted

## 2012-07-06 ENCOUNTER — Telehealth: Payer: Self-pay | Admitting: Cardiology

## 2012-07-06 DIAGNOSIS — Z1211 Encounter for screening for malignant neoplasm of colon: Secondary | ICD-10-CM

## 2012-07-06 NOTE — Telephone Encounter (Signed)
Pt scheduled to have colonoscopy 08/29/12 and needs to be off coumadin 5 day prior/tmj

## 2012-07-06 NOTE — Telephone Encounter (Signed)
Patient needs movi prep 

## 2012-07-06 NOTE — Telephone Encounter (Signed)
I have not seen this gentleman since August of last year. He is followed in our Coumadin clinic with history of DVT and pulmonary embolus diagnosed in July 2012. Based on my note, I was to see him in February of this year and he was to have an echocardiogram done to reassess RV function. Neither has occurred. He has followed with Dr. Lodema Hong however, and I reviewed her notes.   My recommendation would be for him to have an echocardiogram to reassess status, and if he has shown improvement, he has been treated for a year of anticoagulation after his PE/DVT, and could likely come off of the medication without bridging for a colonoscopy. I will refer this to Misty Stanley in the Coumadin clinic for her input and review as well.  Jonelle Sidle, M.D., F.A.C.C.

## 2012-07-06 NOTE — Telephone Encounter (Signed)
Spoke with Dewayne Hatch at Dr Patty Sermons office and made her aware of recommendations.  She states she will relay this information to the patient to expect a call from our office regarding scheduling an echocardiogram.

## 2012-07-06 NOTE — Telephone Encounter (Signed)
Awaiting echo results.

## 2012-07-06 NOTE — Telephone Encounter (Signed)
Please advise 

## 2012-07-07 MED ORDER — PEG-KCL-NACL-NASULF-NA ASC-C 100 G PO SOLR: 1.0000 | Freq: Once | ORAL | Status: DC

## 2012-07-10 ENCOUNTER — Ambulatory Visit (HOSPITAL_COMMUNITY)
Admission: RE | Admit: 2012-07-10 | Discharge: 2012-07-10 | Disposition: A | Discharge status: Home / Self Care | Payer: Medicare Other | Source: Ambulatory Visit | Attending: Cardiology | Admitting: Cardiology

## 2012-07-10 DIAGNOSIS — I10 Essential (primary) hypertension: Secondary | ICD-10-CM | POA: Insufficient documentation

## 2012-07-10 DIAGNOSIS — I517 Cardiomegaly: Secondary | ICD-10-CM

## 2012-07-10 DIAGNOSIS — I519 Heart disease, unspecified: Secondary | ICD-10-CM

## 2012-07-10 DIAGNOSIS — E119 Type 2 diabetes mellitus without complications: Secondary | ICD-10-CM | POA: Insufficient documentation

## 2012-07-10 NOTE — Progress Notes (Signed)
*  PRELIMINARY RESULTS* Echocardiogram 2D Echocardiogram has been performed.  Samuel Ryan 07/10/2012, 2:44 PM

## 2012-08-02 ENCOUNTER — Ambulatory Visit (INDEPENDENT_AMBULATORY_CARE_PROVIDER_SITE_OTHER): Payer: Medicare Other | Admitting: *Deleted

## 2012-08-02 DIAGNOSIS — I824Y9 Acute embolism and thrombosis of unspecified deep veins of unspecified proximal lower extremity: Secondary | ICD-10-CM

## 2012-08-02 DIAGNOSIS — Z7901 Long term (current) use of anticoagulants: Secondary | ICD-10-CM

## 2012-08-02 DIAGNOSIS — I2699 Other pulmonary embolism without acute cor pulmonale: Secondary | ICD-10-CM

## 2012-08-11 ENCOUNTER — Other Ambulatory Visit: Payer: Self-pay

## 2012-08-11 DIAGNOSIS — I10 Essential (primary) hypertension: Secondary | ICD-10-CM

## 2012-08-11 MED ORDER — DONEPEZIL HCL 10 MG PO TABS: 10.0000 mg | ORAL_TABLET | Freq: Every day | ORAL | Status: DC

## 2012-08-11 MED ORDER — LOVASTATIN 40 MG PO TABS: 40.0000 mg | ORAL_TABLET | Freq: Every day | ORAL | Status: DC

## 2012-08-11 MED ORDER — BENAZEPRIL HCL 10 MG PO TABS: 10.0000 mg | ORAL_TABLET | Freq: Every day | ORAL | Status: DC

## 2012-08-16 ENCOUNTER — Telehealth (INDEPENDENT_AMBULATORY_CARE_PROVIDER_SITE_OTHER): Payer: Self-pay | Admitting: *Deleted

## 2012-08-16 NOTE — Telephone Encounter (Signed)
PCP/Requesting MD: simspon  Name & DOB: Samuel Ryan 02/15/1931     Procedure: tcs  Reason/Indication:  screening  Has patient had this procedure before?  yes  If so, when, by whom and where?  More than 10 yrs ago  Is there a family history of colon cancer?  no  Who?  What age when diagnosed?    Is patient diabetic?   yes      Does patient have prosthetic heart valve?  no  Do you have a pacemaker?  no  Has patient had joint replacement within last 12 months?  no  Is patient on Coumadin, Plavix and/or Aspirin? yes  Medications: coumadin 5 mg sun, tues & thurs 1 tab and mon, wed, fri & sat 1/2 tab, metformin 500 mg bid, onglyza 5 mg daily, donepezil 10 mg daily, lovastatin 40 mg daily, benazepril 10 mg daily, tramadol 50 mg bid, rapaflo 8 mg daily   Allergies: nkda  Medication Adjustment: coumadin 5 days, 1/2 metformin day before  Procedure date & time: 08/31/12 at 830

## 2012-08-17 NOTE — Telephone Encounter (Signed)
agree

## 2012-08-22 ENCOUNTER — Telehealth: Payer: Self-pay | Admitting: Family Medicine

## 2012-08-22 NOTE — Telephone Encounter (Signed)
No samples at this time but added his name to list for when rep comes in

## 2012-08-23 ENCOUNTER — Encounter (HOSPITAL_COMMUNITY): Payer: Self-pay | Admitting: Pharmacy Technician

## 2012-08-31 ENCOUNTER — Ambulatory Visit (HOSPITAL_COMMUNITY)
Admission: RE | Admit: 2012-08-31 | Discharge: 2012-08-31 | Disposition: A | Discharge status: Home / Self Care | Payer: Medicare Other | Source: Ambulatory Visit | Attending: Internal Medicine | Admitting: Internal Medicine

## 2012-08-31 ENCOUNTER — Encounter (HOSPITAL_COMMUNITY)
Admission: RE | Disposition: A | Discharge status: Home / Self Care | Payer: Self-pay | Source: Ambulatory Visit | Attending: Internal Medicine

## 2012-08-31 ENCOUNTER — Encounter (HOSPITAL_COMMUNITY): Payer: Self-pay | Admitting: *Deleted

## 2012-08-31 DIAGNOSIS — K5730 Diverticulosis of large intestine without perforation or abscess without bleeding: Secondary | ICD-10-CM | POA: Insufficient documentation

## 2012-08-31 DIAGNOSIS — K644 Residual hemorrhoidal skin tags: Secondary | ICD-10-CM | POA: Insufficient documentation

## 2012-08-31 DIAGNOSIS — Z79899 Other long term (current) drug therapy: Secondary | ICD-10-CM | POA: Insufficient documentation

## 2012-08-31 DIAGNOSIS — Z1211 Encounter for screening for malignant neoplasm of colon: Secondary | ICD-10-CM

## 2012-08-31 DIAGNOSIS — E119 Type 2 diabetes mellitus without complications: Secondary | ICD-10-CM | POA: Insufficient documentation

## 2012-08-31 DIAGNOSIS — Z7901 Long term (current) use of anticoagulants: Secondary | ICD-10-CM | POA: Insufficient documentation

## 2012-08-31 DIAGNOSIS — I10 Essential (primary) hypertension: Secondary | ICD-10-CM | POA: Insufficient documentation

## 2012-08-31 DIAGNOSIS — E785 Hyperlipidemia, unspecified: Secondary | ICD-10-CM | POA: Insufficient documentation

## 2012-08-31 HISTORY — PX: COLONOSCOPY: SHX5424

## 2012-08-31 SURGERY — COLONOSCOPY
Anesthesia: Moderate Sedation

## 2012-08-31 MED ORDER — STERILE WATER FOR IRRIGATION IR SOLN
Status: DC | PRN
  Administered 2012-08-31: 17:00:00

## 2012-08-31 MED ORDER — MIDAZOLAM HCL 5 MG/5ML IJ SOLN
INTRAMUSCULAR | Status: AC
  Filled 2012-08-31: qty 10

## 2012-08-31 MED ORDER — MIDAZOLAM HCL 5 MG/5ML IJ SOLN
INTRAMUSCULAR | Status: DC | PRN
  Administered 2012-08-31: 1 mg via INTRAVENOUS
  Administered 2012-08-31: 2 mg via INTRAVENOUS

## 2012-08-31 MED ORDER — MEPERIDINE HCL 50 MG/ML IJ SOLN
INTRAMUSCULAR | Status: DC | PRN
  Administered 2012-08-31: 25 mg via INTRAVENOUS

## 2012-08-31 MED ORDER — MEPERIDINE HCL 50 MG/ML IJ SOLN
INTRAMUSCULAR | Status: AC
  Filled 2012-08-31: qty 1

## 2012-08-31 MED ORDER — SODIUM CHLORIDE 0.45 % IV SOLN
INTRAVENOUS | Status: DC
  Administered 2012-08-31: 1000 mL via INTRAVENOUS

## 2012-08-31 NOTE — H&P (Signed)
Samuel Ryan is an 78 y.o. male.   Chief Complaint: Patient is here for colonoscopy. HPI: Patient is 78 year old African male who is here for screening colonoscopy. He recently had a bout of diarrhea has resolved. He denies abdominal pain rectal bleeding. His last colonoscopy was over 10 years ago. Family history is negative for colorectal carcinoma. Last dose of warfarin was 5 days ago.  Past Medical History  Diagnosis Date  . Type 2 diabetes mellitus   . Mixed hyperlipidemia   . Essential hypertension, benign   . Dementia   . BPH (benign prostatic hypertrophy)   . Pulmonary embolism     Diagnosed 7/12  . Deep venous thrombosis     Left leg, diagnosed 7/12  . Osteoarthritis     Past Surgical History  Procedure Date  . Hernia repair   . Neck fusion   . Carpal tunnel release   . Right total knee replacement 04/23/2011    Family History  Problem Relation Age of Onset  . Cancer Mother     Stomach  . Diabetes Sister   . Hypertension Sister   . Diabetes Brother   . Cancer Brother     Gallbladder   Social History:  reports that he has never smoked. He has never used smokeless tobacco. He reports that he does not drink alcohol or use illicit drugs.  Allergies: No Known Allergies  Medications Prior to Admission  Medication Sig Dispense Refill  . acetaminophen (TYLENOL) 325 MG tablet Take 650 mg by mouth every 6 (six) hours as needed. Pain.      . benazepril (LOTENSIN) 10 MG tablet Take 1 tablet (10 mg total) by mouth daily.  30 tablet  5  . clobetasol cream (TEMOVATE) 0.05 % Apply 1 application topically 2 (two) times daily.  60 g  1  . donepezil (ARICEPT) 10 MG tablet Take 1 tablet (10 mg total) by mouth at bedtime.  30 tablet  5  . glucose blood test strip 1 each by Other route every other day. Use as instructed      . lovastatin (MEVACOR) 40 MG tablet Take 1 tablet (40 mg total) by mouth at bedtime.  30 tablet  5  . metFORMIN (GLUCOPHAGE) 500 MG tablet Take 1 tablet (500 mg  total) by mouth 2 (two) times daily with a meal.  60 tablet  11  . peg 3350 powder (MOVIPREP) 100 G SOLR Take 1 kit (100 g total) by mouth once.  1 kit  0  . saxagliptin HCl (ONGLYZA) 5 MG TABS tablet Take 5 mg by mouth daily.      . silodosin (RAPAFLO) 8 MG CAPS capsule Take 1 capsule (8 mg total) by mouth daily with breakfast.  30 capsule  1  . traMADol (ULTRAM) 50 MG tablet Take 1 tablet (50 mg total) by mouth 2 (two) times daily.  60 tablet  4  . warfarin (COUMADIN) 5 MG tablet Take 2.5-5 mg by mouth daily. 2.5 on MWF. Sun, Sat, Tues, and Thurs = 5 mg        Results for orders placed during the hospital encounter of 08/31/12 (from the past 48 hour(s))  GLUCOSE, CAPILLARY     Status: Abnormal   Collection Time   08/31/12  3:07 PM      Component Value Range Comment   Glucose-Capillary 122 (*) 70 - 99 mg/dL    No results found.  ROS  Blood pressure 172/97, pulse 80, temperature 98 F (36.7 C), temperature  source Oral, resp. rate 20, height 5\' 10"  (1.778 m), weight 244 lb (110.678 kg), SpO2 97.00%. Physical Exam  Constitutional: He appears well-developed and well-nourished.  HENT:  Mouth/Throat: Oropharynx is clear and moist.  Eyes: Conjunctivae normal are normal. No scleral icterus.  Neck: No thyromegaly present.  Cardiovascular: Normal rate, regular rhythm and normal heart sounds.   No murmur heard. Respiratory: Effort normal and breath sounds normal.  GI: He exhibits no distension and no mass. There is no tenderness.  Musculoskeletal: He exhibits no edema.  Lymphadenopathy:    He has no cervical adenopathy.  Neurological: He is alert.  Skin: Skin is warm and dry.     Assessment/Plan Average risk screening colonoscopy.  Roverto Bodmer U 08/31/2012, 4:49 PM

## 2012-08-31 NOTE — Op Note (Signed)
COLONOSCOPY PROCEDURE REPORT  PATIENT:  Samuel Ryan  MR#:  [id number] Birthdate:  [birth year], 78 y.o., male Endoscopist:  Dr. Malissa Hippo, MD Referred By:  Dr. Syliva Overman, MD Procedure Date: 08/31/2012  Procedure:   Colonoscopy  Indications:  Patient is 78 year old African American male who is undergoing average risk screening colonoscopy.  Informed Consent:  The procedure and risks were reviewed with the patient and informed consent was obtained.  Medications:  Demerol 25 mg IV Versed 3 mg IV  Description of procedure:  After a digital rectal exam was performed, that colonoscope was advanced from the anus through the rectum and colon to the area of the cecum, ileocecal valve and appendiceal orifice. The cecum was deeply intubated. These structures were well-seen and photographed for the record. From the level of the cecum and ileocecal valve, the scope was slowly and cautiously withdrawn. The mucosal surfaces were carefully surveyed utilizing scope tip to flexion to facilitate fold flattening as needed. The scope was pulled down into the rectum where a thorough exam including retroflexion was performed.  Findings:   Prep satisfactory. Scattered diverticula at sigmoid colon. Normal rectal mucosa. Small hemorrhoids below the dentate line.  Therapeutic/Diagnostic Maneuvers Performed:  None  Complications:  None  Cecal Withdrawal Time:  8 minutes  Impression:  Examination performed to cecum. Sigmoid diverticulosis and external hemorrhoids otherwise normal colonoscopy.  Recommendations:  Standard instructions given. Patient will resume warfarin at usual dose INR checked in 7-10 days.  Evanne Matsunaga U  08/31/2012 5:22 PM  CC: Dr. Syliva Overman, MD & Dr. Bonnetta Barry ref. provider found

## 2012-09-06 ENCOUNTER — Other Ambulatory Visit: Payer: Self-pay

## 2012-09-06 ENCOUNTER — Encounter (HOSPITAL_COMMUNITY): Payer: Self-pay | Admitting: Internal Medicine

## 2012-09-06 DIAGNOSIS — E119 Type 2 diabetes mellitus without complications: Secondary | ICD-10-CM

## 2012-09-06 MED ORDER — METFORMIN HCL 500 MG PO TABS: 500.0000 mg | ORAL_TABLET | Freq: Two times a day (BID) | ORAL | Status: DC

## 2012-09-06 MED ORDER — SILODOSIN 8 MG PO CAPS: 8.0000 mg | ORAL_CAPSULE | Freq: Every day | ORAL | Status: DC

## 2012-09-07 ENCOUNTER — Ambulatory Visit (INDEPENDENT_AMBULATORY_CARE_PROVIDER_SITE_OTHER): Payer: Medicare Other | Admitting: *Deleted

## 2012-09-07 DIAGNOSIS — I2699 Other pulmonary embolism without acute cor pulmonale: Secondary | ICD-10-CM

## 2012-09-07 DIAGNOSIS — I824Y9 Acute embolism and thrombosis of unspecified deep veins of unspecified proximal lower extremity: Secondary | ICD-10-CM

## 2012-09-07 DIAGNOSIS — Z7901 Long term (current) use of anticoagulants: Secondary | ICD-10-CM

## 2012-09-07 LAB — POCT INR: INR: 1.4

## 2012-09-20 ENCOUNTER — Other Ambulatory Visit: Payer: Self-pay

## 2012-09-20 MED ORDER — SAXAGLIPTIN HCL 5 MG PO TABS: 5.0000 mg | ORAL_TABLET | Freq: Every day | ORAL | Status: DC

## 2012-09-21 ENCOUNTER — Ambulatory Visit (INDEPENDENT_AMBULATORY_CARE_PROVIDER_SITE_OTHER): Payer: Medicare Other | Admitting: *Deleted

## 2012-09-21 ENCOUNTER — Telehealth: Payer: Self-pay | Admitting: Family Medicine

## 2012-09-21 DIAGNOSIS — I2699 Other pulmonary embolism without acute cor pulmonale: Secondary | ICD-10-CM

## 2012-09-21 DIAGNOSIS — Z7901 Long term (current) use of anticoagulants: Secondary | ICD-10-CM

## 2012-09-21 DIAGNOSIS — I824Y9 Acute embolism and thrombosis of unspecified deep veins of unspecified proximal lower extremity: Secondary | ICD-10-CM

## 2012-09-21 LAB — POCT INR: INR: 1.5

## 2012-09-21 NOTE — Telephone Encounter (Signed)
CALLED PAIGE FOR SAMPLES

## 2012-10-05 ENCOUNTER — Ambulatory Visit (INDEPENDENT_AMBULATORY_CARE_PROVIDER_SITE_OTHER): Payer: Medicare Other | Admitting: *Deleted

## 2012-10-05 DIAGNOSIS — I2699 Other pulmonary embolism without acute cor pulmonale: Secondary | ICD-10-CM

## 2012-10-05 DIAGNOSIS — I824Y9 Acute embolism and thrombosis of unspecified deep veins of unspecified proximal lower extremity: Secondary | ICD-10-CM

## 2012-10-05 DIAGNOSIS — Z7901 Long term (current) use of anticoagulants: Secondary | ICD-10-CM

## 2012-10-05 LAB — POCT INR: INR: 2

## 2012-10-31 ENCOUNTER — Other Ambulatory Visit: Payer: Self-pay | Admitting: Family Medicine

## 2012-11-01 LAB — COMPLETE METABOLIC PANEL WITH GFR
ALT: <8 U/L (ref 0–53)
AST: 12 U/L (ref 0–37)
Alkaline Phosphatase: 56 U/L (ref 39–117)
Calcium: 8.9 mg/dL (ref 8.4–10.5)
Chloride: 105 mEq/L (ref 96–112)
Creat: 1.42 mg/dL — ABNORMAL HIGH (ref 0.50–1.35)

## 2012-11-01 LAB — LIPID PANEL
Cholesterol: 149 mg/dL (ref 0–200)
HDL: 44 mg/dL (ref >39)
LDL Cholesterol: 86 mg/dL (ref 0–99)
Total CHOL/HDL Ratio: 3.4 Ratio
Triglycerides: 94 mg/dL (ref <150)
VLDL: 19 mg/dL (ref 0–40)

## 2012-11-02 ENCOUNTER — Encounter: Payer: Self-pay | Admitting: Family Medicine

## 2012-11-02 ENCOUNTER — Ambulatory Visit (INDEPENDENT_AMBULATORY_CARE_PROVIDER_SITE_OTHER): Payer: Medicare Other | Admitting: Family Medicine

## 2012-11-02 ENCOUNTER — Ambulatory Visit (INDEPENDENT_AMBULATORY_CARE_PROVIDER_SITE_OTHER): Payer: Medicare Other | Admitting: *Deleted

## 2012-11-02 VITALS — BP 130/80 | HR 78 | Resp 15 | Ht 70.0 in | Wt 241.1 lb

## 2012-11-02 DIAGNOSIS — Z0000 Encounter for general adult medical examination without abnormal findings: Secondary | ICD-10-CM

## 2012-11-02 DIAGNOSIS — I2699 Other pulmonary embolism without acute cor pulmonale: Secondary | ICD-10-CM

## 2012-11-02 DIAGNOSIS — Z7901 Long term (current) use of anticoagulants: Secondary | ICD-10-CM

## 2012-11-02 DIAGNOSIS — I824Y9 Acute embolism and thrombosis of unspecified deep veins of unspecified proximal lower extremity: Secondary | ICD-10-CM

## 2012-11-02 DIAGNOSIS — E119 Type 2 diabetes mellitus without complications: Secondary | ICD-10-CM

## 2012-11-02 DIAGNOSIS — I10 Essential (primary) hypertension: Secondary | ICD-10-CM

## 2012-11-02 LAB — POCT INR: INR: 2.8

## 2012-11-02 NOTE — Patient Instructions (Addendum)
F/u in 4 month  Please call if you need me before.  You can take sudafed one daily for 2 to 3 days for excessive nasal drainage.  Continue benadryl one daily   Blood sugar is a little high, cut back on carbs and sugars   Non fasting hBA1C and chem 7 and EGFR in 4 months

## 2012-11-02 NOTE — Progress Notes (Signed)
Subjective:    Patient ID: Samuel Ryan, male    DOB: 11-05-1931, 78 y.o.   MRN: [id number]  HPI Preventive Screening-Counseling & Management   Patient present here today for a Medicare annual wellness visit.   Current Problems (verified)   Medications Prior to Visit Allergies (verified)   PAST HISTORY  Family History2 sisters deceased one at age 68 month, the other breast cancer, 2 brothers living , one deceased   Social History Married x 60 years , father of 2 children, one is adopted. Retired from [employer] at age 35. No nicotinne , alcohol or street drug use   Risk Factors  Current exercise habits:  Limited due to osteoarthritis in knee, had been going to the [facility]  Dietary issues discussed:Low carb, low sugar diet    Cardiac risk factors: DM, hyperlipidemia   Depression Screen  (Note: if answer to either of the following is "Yes", a more complete depression screening is indicated)  Cries easily thinking about the fact that he is unable to do things that he had been able to in the past  Over the past two weeks, have you felt down, depressed or hopeless? No  Over the past two weeks, have you felt little interest or pleasure in doing things? No  Have you lost interest or pleasure in daily life? No  Do you often feel hopeless? No  Do you cry easily over simple problems? No   Activities of Daily Living  In your present state of health, do you have any difficulty performing the following activities?  Driving?: no longer drives Managing money?: No Feeding yourself?:No Getting from bed to chair?:yes Climbing a flight of stairs?:yes Preparing food and eating?:No Bathing or showering?:yes Getting dressed?:yes Getting to the toilet?:yes Using the toilet?:No Moving around from place to place?: yes ambulates with a cane  Fall Risk Assessment In the past year have you fallen or had a near fall?:No Are you currently taking any medications that make you  dizziness?:No   Hearing Difficulties: No Do you often ask people to speak up or repeat themselves?No Do you experience ringing or noises in your ears?:No Do you have difficulty understanding soft or whispered voices?:yes, has had recent hearing eval,reportedly, and was told he has no ned for hearing aids  Cognitive Testing  Alert? Yes Normal Appearance?Yes  Oriented to person? Yes Place? Yes  Time? Yes  Displays appropriate judgment?Yes  Can read the correct time from a watch face? yes Are you having problems remembering things?No  Advanced Directives have been discussed with the patient?Yes, full code, but does not want to be indefinitely on support if prognosis is poor or he is brain dead    List the Names of Other Physician/Practitioners you currently use:      Assessment:    Annual Wellness Exam   Plan:    During the course of the visit the patient was educated and counseled about appropriate screening and preventive services including:  A healthy diet is rich in fruit, vegetables and whole grains. Poultry fish, nuts and beans are a healthy choice for protein rather then red meat. A low sodium diet and drinking 64 ounces of water daily is generally recommended. Oils and sweet should be limited. Carbohydrates especially for those who are diabetic or overweight, should be limited to 30-45 gram per meal. It is important to eat on a regular schedule, at least 3 times daily. Snacks should be primarily fruits, vegetables or nuts. It is important that you  exercise regularly at least 30 minutes 5 times a week. If you develop chest pain, have severe difficulty breathing, or feel very tired, stop exercising immediately and seek medical attention  Immunization reviewed and updated. Cancer screening reviewed and updated    Patient Instructions (the written plan) was given to the patient.  Medicare Attestation  I have personally reviewed:  The patient's medical and social history   Their use of alcohol, tobacco or illicit drugs  Their current medications and supplements  The patient's functional ability including ADLs,fall risks, home safety risks, cognitive, and hearing and visual impairment  Diet and physical activities  Evidence for depression or mood disorders  The patient's weight, height, BMI, and visual acuity have been recorded in the chart. I have made referrals, counseling, and provided education to the patient based on review of the above and I have provided the patient with a written personalized care plan for preventive services.      Review of Systems     Objective:   Physical Exam        Assessment & Plan:

## 2012-11-05 DIAGNOSIS — Z09 Encounter for follow-up examination after completed treatment for conditions other than malignant neoplasm: Secondary | ICD-10-CM | POA: Insufficient documentation

## 2012-11-05 NOTE — Assessment & Plan Note (Signed)
Pt in for annual wellness exam His limitation is with physical activity due to severe arthritis, he is s/p knee replacement, and still ambulates with a cane. He does have dementia but does well with mini cog exam,  Ned to document end of lifw issues which were discudssed, he is a full code, but does not want "indeinite support" in the event of brain death

## 2012-11-29 DIAGNOSIS — K810 Acute cholecystitis: Secondary | ICD-10-CM

## 2012-11-29 HISTORY — DX: Acute cholecystitis: K81.0

## 2012-11-30 ENCOUNTER — Ambulatory Visit (INDEPENDENT_AMBULATORY_CARE_PROVIDER_SITE_OTHER): Payer: Medicare Other | Admitting: *Deleted

## 2012-11-30 DIAGNOSIS — Z7901 Long term (current) use of anticoagulants: Secondary | ICD-10-CM

## 2012-11-30 DIAGNOSIS — I824Y9 Acute embolism and thrombosis of unspecified deep veins of unspecified proximal lower extremity: Secondary | ICD-10-CM

## 2012-11-30 DIAGNOSIS — I2699 Other pulmonary embolism without acute cor pulmonale: Secondary | ICD-10-CM

## 2012-12-01 ENCOUNTER — Emergency Department (HOSPITAL_COMMUNITY)
Admission: EM | Admit: 2012-12-01 | Discharge: 2012-12-02 | Disposition: A | Discharge status: Home / Self Care | Payer: Medicare Other | Attending: Emergency Medicine | Admitting: Emergency Medicine

## 2012-12-01 ENCOUNTER — Emergency Department (HOSPITAL_COMMUNITY): Payer: Medicare Other

## 2012-12-01 ENCOUNTER — Encounter (HOSPITAL_COMMUNITY): Payer: Self-pay | Admitting: Emergency Medicine

## 2012-12-01 DIAGNOSIS — I2699 Other pulmonary embolism without acute cor pulmonale: Secondary | ICD-10-CM | POA: Insufficient documentation

## 2012-12-01 DIAGNOSIS — M1990 Unspecified osteoarthritis, unspecified site: Secondary | ICD-10-CM | POA: Insufficient documentation

## 2012-12-01 DIAGNOSIS — F0390 Unspecified dementia without behavioral disturbance: Secondary | ICD-10-CM | POA: Insufficient documentation

## 2012-12-01 DIAGNOSIS — J209 Acute bronchitis, unspecified: Secondary | ICD-10-CM | POA: Insufficient documentation

## 2012-12-01 DIAGNOSIS — E119 Type 2 diabetes mellitus without complications: Secondary | ICD-10-CM | POA: Insufficient documentation

## 2012-12-01 DIAGNOSIS — I10 Essential (primary) hypertension: Secondary | ICD-10-CM | POA: Insufficient documentation

## 2012-12-01 DIAGNOSIS — J40 Bronchitis, not specified as acute or chronic: Secondary | ICD-10-CM

## 2012-12-01 DIAGNOSIS — N400 Enlarged prostate without lower urinary tract symptoms: Secondary | ICD-10-CM | POA: Insufficient documentation

## 2012-12-01 DIAGNOSIS — I82409 Acute embolism and thrombosis of unspecified deep veins of unspecified lower extremity: Secondary | ICD-10-CM | POA: Insufficient documentation

## 2012-12-01 DIAGNOSIS — E785 Hyperlipidemia, unspecified: Secondary | ICD-10-CM | POA: Insufficient documentation

## 2012-12-01 DIAGNOSIS — Z7901 Long term (current) use of anticoagulants: Secondary | ICD-10-CM | POA: Insufficient documentation

## 2012-12-01 DIAGNOSIS — Z79899 Other long term (current) drug therapy: Secondary | ICD-10-CM | POA: Insufficient documentation

## 2012-12-01 LAB — POCT I-STAT TROPONIN I: Troponin i, poc: 0 ng/mL (ref 0.00–0.08)

## 2012-12-01 LAB — URINALYSIS, ROUTINE W REFLEX MICROSCOPIC
Glucose, UA: NEGATIVE mg/dL
Leukocytes, UA: NEGATIVE
Nitrite: NEGATIVE
Protein, ur: 30 mg/dL — AB
Specific Gravity, Urine: 1.027 (ref 1.005–1.030)
Urobilinogen, UA: 0.2 mg/dL (ref 0.0–1.0)
pH: 5.5 (ref 5.0–8.0)

## 2012-12-01 LAB — COMPREHENSIVE METABOLIC PANEL
Alkaline Phosphatase: 64 U/L (ref 39–117)
BUN: 13 mg/dL (ref 6–23)
CO2: 26 mEq/L (ref 19–32)
Chloride: 100 mEq/L (ref 96–112)
Creatinine, Ser: 1.43 mg/dL — ABNORMAL HIGH (ref 0.50–1.35)
GFR calc non Af Amer: 45 mL/min — ABNORMAL LOW (ref >90)
Glucose, Bld: 192 mg/dL — ABNORMAL HIGH (ref 70–99)
Potassium: 4.7 mEq/L (ref 3.5–5.1)
Total Bilirubin: 0.7 mg/dL (ref 0.3–1.2)

## 2012-12-01 LAB — URINE MICROSCOPIC-ADD ON

## 2012-12-01 LAB — CBC WITH DIFFERENTIAL/PLATELET
Basophils Absolute: 0 10*3/uL (ref 0.0–0.1)
Basophils Relative: 0 % (ref 0–1)
Eosinophils Absolute: 0 10*3/uL (ref 0.0–0.7)
Eosinophils Relative: 0 % (ref 0–5)
HCT: 32 % — ABNORMAL LOW (ref 39.0–52.0)
Hemoglobin: 10.6 g/dL — ABNORMAL LOW (ref 13.0–17.0)
Lymphocytes Relative: 11 % — ABNORMAL LOW (ref 12–46)
Lymphs Abs: 1.2 10*3/uL (ref 0.7–4.0)
MCH: 30.1 pg (ref 26.0–34.0)
MCHC: 33.1 g/dL (ref 30.0–36.0)
MCV: 90.9 fL (ref 78.0–100.0)
Monocytes Absolute: 0.7 10*3/uL (ref 0.1–1.0)
Monocytes Relative: 6 % (ref 3–12)
Neutro Abs: 9 10*3/uL — ABNORMAL HIGH (ref 1.7–7.7)
Neutrophils Relative %: 83 % — ABNORMAL HIGH (ref 43–77)
Platelets: 270 10*3/uL (ref 150–400)
RBC: 3.52 MIL/uL — ABNORMAL LOW (ref 4.22–5.81)
RDW: 13.3 % (ref 11.5–15.5)
WBC: 10.9 10*3/uL — ABNORMAL HIGH (ref 4.0–10.5)

## 2012-12-01 LAB — LACTIC ACID, PLASMA: Lactic Acid, Venous: 1.2 mmol/L (ref 0.5–2.2)

## 2012-12-01 LAB — PROTIME-INR: Prothrombin Time: 28.9 seconds — ABNORMAL HIGH (ref 11.6–15.2)

## 2012-12-01 MED ORDER — IOHEXOL 350 MG/ML SOLN
100.0000 mL | Freq: Once | INTRAVENOUS | Status: AC | PRN
  Administered 2012-12-01: 100 mL via INTRAVENOUS

## 2012-12-01 MED ORDER — AZITHROMYCIN 250 MG PO TABS: ORAL_TABLET | ORAL | Status: DC

## 2012-12-01 MED ORDER — AZITHROMYCIN 250 MG PO TABS
500.0000 mg | ORAL_TABLET | Freq: Once | ORAL | Status: AC
  Administered 2012-12-02: 500 mg via ORAL
  Filled 2012-12-01: qty 2

## 2012-12-01 MED ORDER — OXYCODONE-ACETAMINOPHEN 5-325 MG PO TABS
1.0000 | ORAL_TABLET | Freq: Once | ORAL | Status: AC
  Administered 2012-12-01: 1 via ORAL
  Filled 2012-12-01: qty 1

## 2012-12-01 MED ORDER — ALBUTEROL SULFATE HFA 108 (90 BASE) MCG/ACT IN AERS: 1.0000 | INHALATION_SPRAY | Freq: Four times a day (QID) | RESPIRATORY_TRACT | Status: DC | PRN

## 2012-12-01 MED ORDER — SODIUM CHLORIDE 0.9 % IV SOLN
INTRAVENOUS | Status: DC
  Administered 2012-12-01: 21:00:00 via INTRAVENOUS

## 2012-12-01 NOTE — ED Provider Notes (Signed)
History     CSN: [id number]  Arrival date & time 12/01/12  1904   First MD Initiated Contact with Patient 12/01/12 1952      Chief Complaint  Patient presents with  . Cough    (Consider location/radiation/quality/duration/timing/severity/associated sxs/prior treatment) Patient is a 79 y.o. male presenting with cough. The history is provided by the patient and the spouse.  Cough   Patient here with worsening cough x2 weeks with associated abdominal soreness. No nausea vomiting or diarrhea. Increased weakness was worse stimulation. Does note some dysuria. Denies any flank pain. No recent fevers. Denies any anginal type chest pain. No orthopnea but does have some pedal edema bilateral. No medications used prior to arrival the symptoms worse with activity Past Medical History  Diagnosis Date  . Type 2 diabetes mellitus   . Mixed hyperlipidemia   . Essential hypertension, benign   . Dementia   . BPH (benign prostatic hypertrophy)   . Pulmonary embolism     Diagnosed 7/12  . Deep venous thrombosis     Left leg, diagnosed 7/12  . Osteoarthritis     Past Surgical History  Procedure Date  . Hernia repair   . Neck fusion   . Carpal tunnel release   . Right total knee replacement 04/23/2011  . Colonoscopy 08/31/2012    Procedure: COLONOSCOPY;  Surgeon: Malissa Hippo, MD;  Location: [facility];  Service: Endoscopy;  Laterality: N/A;  830    Family History  Problem Relation Age of Onset  . Cancer Mother     Stomach  . Diabetes Sister   . Hypertension Sister   . Diabetes Brother   . Cancer Brother     Gallbladder    History  Substance Use Topics  . Smoking status: Never Smoker   . Smokeless tobacco: Never Used  . Alcohol Use: No      Review of Systems  Respiratory: Positive for cough.   All other systems reviewed and are negative.    Allergies  Review of patient's allergies indicates no known allergies.  Home Medications   Current Outpatient Rx  Name   Route  Sig  Dispense  Refill  . ACETAMINOPHEN 325 MG PO TABS   Oral   Take 650 mg by mouth every 6 (six) hours as needed. Pain.         [name] BENAZEPRIL HCL 10 MG PO TABS   Oral   Take 1 tablet (10 mg total) by mouth daily.   30 tablet   5   . CLOBETASOL PROPIONATE 0.05 % EX CREA   Topical   Apply 1 application topically 2 (two) times daily.   60 g   1   . DONEPEZIL HCL 10 MG PO TABS   Oral   Take 1 tablet (10 mg total) by mouth at bedtime.   30 tablet   5   . GLUCOSE BLOOD VI STRP   Other   1 each by Other route every other day. Use as instructed         . METFORMIN HCL 500 MG PO TABS   Oral   Take 1 tablet (500 mg total) by mouth 2 (two) times daily with a meal.   60 tablet   11   . SAXAGLIPTIN HCL 5 MG PO TABS   Oral   Take 1 tablet (5 mg total) by mouth daily.   30 tablet   3   . SILODOSIN 8 MG PO CAPS   Oral  Take 1 capsule (8 mg total) by mouth daily with breakfast.   30 capsule   11   . TRAMADOL HCL 50 MG PO TABS   Oral   Take 1 tablet (50 mg total) by mouth 2 (two) times daily.   60 tablet   4   . WARFARIN SODIUM 5 MG PO TABS   Oral   Take 2.5-5 mg by mouth daily. 5mg  daily except 2.5mg  Mon & Fri         . LOVASTATIN 40 MG PO TABS   Oral   Take 1 tablet (40 mg total) by mouth at bedtime.   30 tablet   5     BP 113/59  Pulse 105  Temp 99.4 F (37.4 C) (Oral)  Resp 18  Ht 6' (1.829 m)  Wt 234 lb (106.142 kg)  BMI 31.74 kg/m2  SpO2 95%  Physical Exam  Nursing note and vitals reviewed. Constitutional: He is oriented to person, place, and time. He appears well-developed and well-nourished.  Non-toxic appearance. No distress.  HENT:  Head: Normocephalic and atraumatic.  Eyes: Conjunctivae normal, EOM and lids are normal. Pupils are equal, round, and reactive to light.  Neck: Normal range of motion. Neck supple. No tracheal deviation present. No mass present.  Cardiovascular: Normal rate, regular rhythm and normal heart sounds.  Exam  reveals no gallop.   No murmur heard. Pulmonary/Chest: Effort normal. No stridor. No respiratory distress. He has decreased breath sounds. He has wheezes. He has no rhonchi. He has no rales.  Abdominal: Soft. Normal appearance and bowel sounds are normal. He exhibits no distension. There is no tenderness. There is no rebound and no CVA tenderness.  Musculoskeletal: Normal range of motion. He exhibits no edema and no tenderness.       3+ bilateral pitting edema  Neurological: He is alert and oriented to person, place, and time. He has normal strength. No cranial nerve deficit or sensory deficit. GCS eye subscore is 4. GCS verbal subscore is 5. GCS motor subscore is 6.  Skin: Skin is warm and dry. No abrasion and no rash noted.  Psychiatric: He has a normal mood and affect. His speech is delayed. He is slowed.    ED Course  Procedures (including critical care time)   Labs Reviewed  COMPREHENSIVE METABOLIC PANEL  CBC WITH DIFFERENTIAL  URINALYSIS, ROUTINE W REFLEX MICROSCOPIC  URINE CULTURE  LACTIC ACID, PLASMA  CULTURE, BLOOD (ROUTINE X 2)  CULTURE, BLOOD (ROUTINE X 2)  PROTIME-INR   No results found.   No diagnosis found.    MDM  Patient's labs and x-rays reviewed here. He is resting comfortably at this time no signs of hypoxia. Chest CT negative for PE or infection. Urinalysis negative as well. Electrolytes noted mild hyperglycemia. Patient's cardiac enzymes are negative. Patient with probable bronchitis and no signs of CHF at this time. Spoke with his family in his lower extremity edema is chronic. Will treat patient for bronchitis and discharged home        Toy Baker, MD 12/01/12 [phone]

## 2012-12-01 NOTE — ED Notes (Signed)
Pt c/o productive cough/congestion, green/white sputum x 2 weeks as well as abdominal "soreness". Pt denies N/V/D as well as fever. Appears weak in triage.

## 2012-12-01 NOTE — ED Notes (Signed)
WUX:[id number]<WN> Expected date:<BR> Expected time:<BR> Means of arrival:<BR> Comments:<BR> Triage 4

## 2012-12-02 NOTE — ED Notes (Signed)
Discharge instructions reviewed. Rx given x1. Patient instructed to complete all antibiotics.

## 2012-12-03 LAB — URINE CULTURE: Colony Count: NO GROWTH

## 2012-12-07 ENCOUNTER — Other Ambulatory Visit: Payer: Self-pay

## 2012-12-07 DIAGNOSIS — L309 Dermatitis, unspecified: Secondary | ICD-10-CM

## 2012-12-07 MED ORDER — CLOBETASOL PROPIONATE 0.05 % EX CREA: 1.0000 "application " | TOPICAL_CREAM | Freq: Two times a day (BID) | CUTANEOUS | Status: DC

## 2012-12-08 ENCOUNTER — Inpatient Hospital Stay (HOSPITAL_COMMUNITY)
Admission: EM | Admit: 2012-12-08 | Discharge: 2012-12-10 | DRG: 811 | Disposition: A | Discharge status: Home Health | Payer: Medicare Other | Attending: Internal Medicine | Admitting: Internal Medicine

## 2012-12-08 ENCOUNTER — Encounter (HOSPITAL_COMMUNITY): Payer: Self-pay | Admitting: Family Medicine

## 2012-12-08 DIAGNOSIS — Z0000 Encounter for general adult medical examination without abnormal findings: Secondary | ICD-10-CM

## 2012-12-08 DIAGNOSIS — D62 Acute posthemorrhagic anemia: Principal | ICD-10-CM | POA: Diagnosis present

## 2012-12-08 DIAGNOSIS — N400 Enlarged prostate without lower urinary tract symptoms: Secondary | ICD-10-CM | POA: Diagnosis present

## 2012-12-08 DIAGNOSIS — I2699 Other pulmonary embolism without acute cor pulmonale: Secondary | ICD-10-CM

## 2012-12-08 DIAGNOSIS — K5731 Diverticulosis of large intestine without perforation or abscess with bleeding: Secondary | ICD-10-CM | POA: Diagnosis present

## 2012-12-08 DIAGNOSIS — K922 Gastrointestinal hemorrhage, unspecified: Secondary | ICD-10-CM | POA: Diagnosis present

## 2012-12-08 DIAGNOSIS — I82409 Acute embolism and thrombosis of unspecified deep veins of unspecified lower extremity: Secondary | ICD-10-CM

## 2012-12-08 DIAGNOSIS — I10 Essential (primary) hypertension: Secondary | ICD-10-CM | POA: Diagnosis present

## 2012-12-08 DIAGNOSIS — R5381 Other malaise: Secondary | ICD-10-CM | POA: Diagnosis present

## 2012-12-08 DIAGNOSIS — D689 Coagulation defect, unspecified: Secondary | ICD-10-CM

## 2012-12-08 DIAGNOSIS — T45515A Adverse effect of anticoagulants, initial encounter: Secondary | ICD-10-CM | POA: Diagnosis present

## 2012-12-08 DIAGNOSIS — F0390 Unspecified dementia without behavioral disturbance: Secondary | ICD-10-CM | POA: Diagnosis present

## 2012-12-08 DIAGNOSIS — Z7901 Long term (current) use of anticoagulants: Secondary | ICD-10-CM

## 2012-12-08 DIAGNOSIS — F068 Other specified mental disorders due to known physiological condition: Secondary | ICD-10-CM

## 2012-12-08 DIAGNOSIS — Y92009 Unspecified place in unspecified non-institutional (private) residence as the place of occurrence of the external cause: Secondary | ICD-10-CM

## 2012-12-08 DIAGNOSIS — R791 Abnormal coagulation profile: Secondary | ICD-10-CM | POA: Diagnosis present

## 2012-12-08 DIAGNOSIS — E782 Mixed hyperlipidemia: Secondary | ICD-10-CM | POA: Diagnosis present

## 2012-12-08 DIAGNOSIS — Z96659 Presence of unspecified artificial knee joint: Secondary | ICD-10-CM

## 2012-12-08 DIAGNOSIS — K625 Hemorrhage of anus and rectum: Secondary | ICD-10-CM | POA: Diagnosis present

## 2012-12-08 DIAGNOSIS — M129 Arthropathy, unspecified: Secondary | ICD-10-CM

## 2012-12-08 DIAGNOSIS — I129 Hypertensive chronic kidney disease with stage 1 through stage 4 chronic kidney disease, or unspecified chronic kidney disease: Secondary | ICD-10-CM | POA: Diagnosis present

## 2012-12-08 DIAGNOSIS — R58 Hemorrhage, not elsewhere classified: Secondary | ICD-10-CM | POA: Diagnosis present

## 2012-12-08 DIAGNOSIS — K644 Residual hemorrhoidal skin tags: Secondary | ICD-10-CM | POA: Diagnosis present

## 2012-12-08 DIAGNOSIS — E1121 Type 2 diabetes mellitus with diabetic nephropathy: Secondary | ICD-10-CM | POA: Diagnosis present

## 2012-12-08 DIAGNOSIS — M1990 Unspecified osteoarthritis, unspecified site: Secondary | ICD-10-CM | POA: Diagnosis present

## 2012-12-08 DIAGNOSIS — F0280 Dementia in other diseases classified elsewhere without behavioral disturbance: Secondary | ICD-10-CM | POA: Diagnosis present

## 2012-12-08 DIAGNOSIS — E119 Type 2 diabetes mellitus without complications: Secondary | ICD-10-CM | POA: Diagnosis present

## 2012-12-08 DIAGNOSIS — Z79899 Other long term (current) drug therapy: Secondary | ICD-10-CM

## 2012-12-08 DIAGNOSIS — Z86718 Personal history of other venous thrombosis and embolism: Secondary | ICD-10-CM

## 2012-12-08 DIAGNOSIS — Z87898 Personal history of other specified conditions: Secondary | ICD-10-CM

## 2012-12-08 DIAGNOSIS — N289 Disorder of kidney and ureter, unspecified: Secondary | ICD-10-CM

## 2012-12-08 DIAGNOSIS — E785 Hyperlipidemia, unspecified: Secondary | ICD-10-CM | POA: Diagnosis present

## 2012-12-08 DIAGNOSIS — I519 Heart disease, unspecified: Secondary | ICD-10-CM

## 2012-12-08 DIAGNOSIS — Z86711 Personal history of pulmonary embolism: Secondary | ICD-10-CM

## 2012-12-08 DIAGNOSIS — M6530 Trigger finger, unspecified finger: Secondary | ICD-10-CM

## 2012-12-08 DIAGNOSIS — E049 Nontoxic goiter, unspecified: Secondary | ICD-10-CM

## 2012-12-08 DIAGNOSIS — N183 Chronic kidney disease, stage 3 unspecified: Secondary | ICD-10-CM | POA: Diagnosis present

## 2012-12-08 HISTORY — DX: Diverticulitis of intestine, part unspecified, without perforation or abscess without bleeding: K57.92

## 2012-12-08 LAB — CBC WITH DIFFERENTIAL/PLATELET
Basophils Relative: 0 % (ref 0–1)
Hemoglobin: 8.1 g/dL — ABNORMAL LOW (ref 13.0–17.0)
Lymphs Abs: 1.1 10*3/uL (ref 0.7–4.0)
Monocytes Relative: 8 % (ref 3–12)
Neutro Abs: 7.8 10*3/uL — ABNORMAL HIGH (ref 1.7–7.7)
Neutrophils Relative %: 80 % — ABNORMAL HIGH (ref 43–77)
RBC: 2.71 MIL/uL — ABNORMAL LOW (ref 4.22–5.81)
WBC: 9.7 10*3/uL (ref 4.0–10.5)

## 2012-12-08 LAB — CULTURE, BLOOD (ROUTINE X 2)

## 2012-12-08 LAB — COMPREHENSIVE METABOLIC PANEL
Albumin: 2.8 g/dL — ABNORMAL LOW (ref 3.5–5.2)
Alkaline Phosphatase: 72 U/L (ref 39–117)
BUN: 20 mg/dL (ref 6–23)
Chloride: 103 mEq/L (ref 96–112)
Glucose, Bld: 153 mg/dL — ABNORMAL HIGH (ref 70–99)
Potassium: 5.4 mEq/L — ABNORMAL HIGH (ref 3.5–5.1)
Total Bilirubin: 1.2 mg/dL (ref 0.3–1.2)

## 2012-12-08 LAB — OCCULT BLOOD X 1 CARD TO LAB, STOOL: Fecal Occult Bld: NEGATIVE

## 2012-12-08 LAB — PROTIME-INR
INR: 5.82 (ref 0.00–1.49)
Prothrombin Time: 48.3 seconds — ABNORMAL HIGH (ref 11.6–15.2)

## 2012-12-08 LAB — ABO/RH: ABO/RH(D): A POS

## 2012-12-08 LAB — PREPARE RBC (CROSSMATCH)

## 2012-12-08 MED ORDER — SODIUM CHLORIDE 0.9 % IV SOLN: INTRAVENOUS | Status: DC

## 2012-12-08 MED ORDER — BENAZEPRIL HCL 10 MG PO TABS
10.0000 mg | ORAL_TABLET | Freq: Every day | ORAL | Status: DC
  Administered 2012-12-09 – 2012-12-10 (×2): 10 mg via ORAL
  Filled 2012-12-08 (×2): qty 1

## 2012-12-08 MED ORDER — VITAMIN K1 10 MG/ML IJ SOLN
10.0000 mg | Freq: Once | INTRAVENOUS | Status: AC
  Administered 2012-12-08: 10 mg via INTRAVENOUS
  Filled 2012-12-08: qty 1

## 2012-12-08 MED ORDER — TRAMADOL HCL 50 MG PO TABS
50.0000 mg | ORAL_TABLET | Freq: Two times a day (BID) | ORAL | Status: DC
  Administered 2012-12-09 – 2012-12-10 (×4): 50 mg via ORAL
  Filled 2012-12-08 (×7): qty 1

## 2012-12-08 MED ORDER — ACETAMINOPHEN 325 MG PO TABS: 650.0000 mg | ORAL_TABLET | Freq: Four times a day (QID) | ORAL | Status: DC | PRN

## 2012-12-08 MED ORDER — SODIUM CHLORIDE 0.9 % IJ SOLN
3.0000 mL | Freq: Two times a day (BID) | INTRAMUSCULAR | Status: DC
  Administered 2012-12-09 (×2): 3 mL via INTRAVENOUS

## 2012-12-08 MED ORDER — DONEPEZIL HCL 10 MG PO TABS
10.0000 mg | ORAL_TABLET | Freq: Every day | ORAL | Status: DC
  Administered 2012-12-09 (×2): 10 mg via ORAL
  Filled 2012-12-08 (×3): qty 1

## 2012-12-08 MED ORDER — SODIUM CHLORIDE 0.9 % IV SOLN
INTRAVENOUS | Status: DC
  Administered 2012-12-08: 20 mL via INTRAVENOUS
  Administered 2012-12-09: 20 mL/h via INTRAVENOUS

## 2012-12-08 MED ORDER — METFORMIN HCL 500 MG PO TABS
500.0000 mg | ORAL_TABLET | Freq: Two times a day (BID) | ORAL | Status: DC
  Administered 2012-12-09: 500 mg via ORAL
  Filled 2012-12-08 (×3): qty 1

## 2012-12-08 MED ORDER — ALBUTEROL SULFATE HFA 108 (90 BASE) MCG/ACT IN AERS: 1.0000 | INHALATION_SPRAY | Freq: Four times a day (QID) | RESPIRATORY_TRACT | Status: DC | PRN

## 2012-12-08 MED ORDER — LINAGLIPTIN 5 MG PO TABS
5.0000 mg | ORAL_TABLET | Freq: Every day | ORAL | Status: DC
  Administered 2012-12-09 – 2012-12-10 (×2): 5 mg via ORAL
  Filled 2012-12-08 (×2): qty 1

## 2012-12-08 MED ORDER — SIMVASTATIN 20 MG PO TABS
20.0000 mg | ORAL_TABLET | Freq: Every day | ORAL | Status: DC
  Administered 2012-12-09: 20 mg via ORAL
  Filled 2012-12-08 (×2): qty 1

## 2012-12-08 MED ORDER — TAMSULOSIN HCL 0.4 MG PO CAPS
0.4000 mg | ORAL_CAPSULE | Freq: Every day | ORAL | Status: DC
  Administered 2012-12-09 – 2012-12-10 (×2): 0.4 mg via ORAL
  Filled 2012-12-08 (×2): qty 1

## 2012-12-08 NOTE — H&P (Signed)
[facility] History and Physical  Samuel Ryan ZOX:[id number] DOB: [birth year] DOA: 12/08/2012  Referring physician: ED PCP: Syliva Overman, MD  Specialists: None  Chief Complaint: BRBPR  HPI: Samuel Ryan is a 79 y.o. male who presents with c/o single episode of BRBPR occuring earlier this afternoon.  Patient is on coumadin for a h/o DVT in the past (more than 6 months ago).  Patient was put on azithromycin a week or so ago for a URI as well.  Patient had a colonoscopy a couple of months ago that showed diverticulosis.  In the ED, HGB was 8.1, 2 units PRBC ordered, had a second bowel movement which thankfully was occult blood negative suggesting that his bleeding has stopped, hospitalist asked to admit.  Review of Systems: 12 systems reviewed and otherwise negative.  Past Medical History  Diagnosis Date  . Type 2 diabetes mellitus   . Mixed hyperlipidemia   . Essential hypertension, benign   . Dementia   . BPH (benign prostatic hypertrophy)   . Pulmonary embolism     Diagnosed 7/12  . Deep venous thrombosis     Left leg, diagnosed 7/12  . Osteoarthritis    Past Surgical History  Procedure Date  . Hernia repair   . Neck fusion   . Carpal tunnel release   . Right total knee replacement 04/23/2011  . Colonoscopy 08/31/2012    Procedure: COLONOSCOPY;  Surgeon: Malissa Hippo, MD;  Location: [facility];  Service: Endoscopy;  Laterality: N/A;  830   Social History:  reports that he has never smoked. He has never used smokeless tobacco. He reports that he does not drink alcohol or use illicit drugs.   No Known Allergies  Family History  Problem Relation Age of Onset  . Cancer Mother     Stomach  . Diabetes Sister   . Hypertension Sister   . Diabetes Brother   . Cancer Brother     Gallbladder     Prior to Admission medications   Medication Sig Start Date End Date Taking? Authorizing Provider  acetaminophen (TYLENOL) 325 MG tablet Take 650 mg by mouth every 6  (six) hours as needed. Pain.   Yes Historical Provider, MD  albuterol (PROVENTIL HFA;VENTOLIN HFA) 108 (90 BASE) MCG/ACT inhaler Inhale 1-2 puffs into the lungs every 6 (six) hours as needed for wheezing. 12/01/12  Yes Toy Baker, MD  benazepril (LOTENSIN) 10 MG tablet Take 1 tablet (10 mg total) by mouth daily. 08/11/12 08/11/13 Yes Kerri Perches, MD  donepezil (ARICEPT) 10 MG tablet Take 1 tablet (10 mg total) by mouth at bedtime. 08/11/12  Yes Kerri Perches, MD  lovastatin (MEVACOR) 40 MG tablet Take 1 tablet (40 mg total) by mouth at bedtime. 08/11/12  Yes Kerri Perches, MD  metFORMIN (GLUCOPHAGE) 500 MG tablet Take 1 tablet (500 mg total) by mouth 2 (two) times daily with a meal. 09/06/12  Yes Kerri Perches, MD  saxagliptin HCl (ONGLYZA) 5 MG TABS tablet Take 1 tablet (5 mg total) by mouth daily. 09/20/12  Yes Kerri Perches, MD  silodosin (RAPAFLO) 8 MG CAPS capsule Take 1 capsule (8 mg total) by mouth daily with breakfast. 09/06/12  Yes Kerri Perches, MD  traMADol (ULTRAM) 50 MG tablet Take 1 tablet (50 mg total) by mouth 2 (two) times daily. 06/28/12 06/28/13 Yes Kerri Perches, MD  warfarin (COUMADIN) 5 MG tablet Take 2.5-5 mg by mouth every evening. 5mg  daily except 2.5mg  Mon &  Caleen Essex 06/08/12  Yes Kathlen Brunswick, MD   Physical Exam: Filed Vitals:   12/08/12 1930 12/08/12 2030 12/08/12 2100 12/08/12 2216  BP: 135/68   139/73  Pulse: 89   95  Temp: 99.2 F (37.3 C) 99.1 F (37.3 C) 99 F (37.2 C) 97.2 F (36.2 C)  TempSrc: Oral Oral Oral Oral  Resp: 21   27  SpO2: 100%   97%    General:  NAD, resting comfortably in bed, somewhat pale appearing Eyes: PEERLA EOMI ENT: mucous membranes moist Neck: supple w/o JVD Cardiovascular: RRR w/o MRG Respiratory: CTA B Abdomen: soft, nt, nd, bs+ Skin: no rash nor lesion Musculoskeletal: MAE, full ROM all 4 extremities Psychiatric: normal tone and affect Neurologic: AAOx3, grossly non-focal  Labs on  Admission:  Basic Metabolic Panel:  Lab 12/08/12 1610  NA 136  K 5.4*  CL 103  CO2 21  GLUCOSE 153*  BUN 20  CREATININE 1.48*  CALCIUM 8.9  MG --  PHOS --   Liver Function Tests:  Lab 12/08/12 1824  AST 13  ALT 8  ALKPHOS 72  BILITOT 1.2  PROT 7.8  ALBUMIN 2.8*   No results found for this basename: LIPASE:5,AMYLASE:5 in the last 168 hours No results found for this basename: AMMONIA:5 in the last 168 hours CBC:  Lab 12/08/12 1824  WBC 9.7  NEUTROABS 7.8*  HGB 8.1*  HCT 24.3*  MCV 89.7  PLT 478*   Cardiac Enzymes: No results found for this basename: CKTOTAL:5,CKMB:5,CKMBINDEX:5,TROPONINI:5 in the last 168 hours  BNP (last 3 results)  Basename 12/01/12 2042  PROBNP 160.2   CBG: No results found for this basename: GLUCAP:5 in the last 168 hours  Radiological Exams on Admission: No results found.  EKG: Independently reviewed.  Assessment/Plan Principal Problem:  *Lower GI bleed Active Problems:  Bleeding on Coumadin   1. LGIB - thankfully bleeding seems to have stopped with 2 BMs in ED, last of which has been hemoccult negative.  Transfusing 2 units PRBC, 10mg  vit K ordered, holding off on FFP for now unless he re-bleeds.  Discussed risks of transfusion at length personally with family and plan of care.  Admitting to tele monitoring.  Most likely a diverticular bleed (known h/o diverticulosis on colonoscopy a couple of months ago), could also be an AVM, malginancy very unlikely given negative colonoscopy.  May need GI consult in AM. 2. Bleeding on coumadin - with coumadin coagulopathy INR 5.8 likely due to interaction between coumadin in and azithromycin he was just on this past week.  Holding coumadin (single episode of DVT and PE more than 6 months ago anyhow), reversing with vit K for his active bleed. 3. H/o DVT and PE - single episode more than 6 months ago (actually looking back through his records its been over a year, I see anticoag visits as early as  July 2012). 4. DM2 - will continue his home PO hypoglycemics at this time, BGL checks AC/HS    Code Status: Full Code (must indicate code status--if unknown or must be presumed, indicate so) Family Communication: Spoke with wife and son in room (indicate person spoken with, if applicable, with phone number if by telephone) Disposition Plan: Admit to inpatient (indicate anticipated LOS)  Time spent: 70 min  GARDNER, JARED M. [facility] Pager [phone]  If 7PM-7AM, please contact night-coverage [email or web address] Password [facility] 12/08/2012, 10:34 PM

## 2012-12-08 NOTE — ED Notes (Signed)
Per pt family pt having rectal bleeding that started today. sts some bright red and dark. Pt ver weak and confused. sts pt incontinent and normally he is not.

## 2012-12-08 NOTE — ED Notes (Signed)
INR 5.82  Dr Denton Lank notified

## 2012-12-08 NOTE — ED Notes (Signed)
Patient's skin pale.  Patient alert and able to rate pain 6/10

## 2012-12-08 NOTE — ED Provider Notes (Signed)
History     CSN: [id number]  Arrival date & time 12/08/12  1739   First MD Initiated Contact with Patient 12/08/12 1845      Chief Complaint  Patient presents with  . Rectal Bleeding    (Consider location/radiation/quality/duration/timing/severity/associated sxs/prior treatment) Patient is a 79 y.o. male presenting with hematochezia. The history is provided by the patient and a relative.  Rectal Bleeding  Pertinent negatives include no fever, no abdominal pain, no chest pain, no headaches, no coughing and no rash.  pt with hx diverticula, on coumadin for dvt hx, presents w episode brbpr today. Felt generally weak, mild lightheadedness on standing. No syncope.  Denies hx same. No hx gib. No nv. No abd pain. Denies other abn bleeding or bruising. No hematuria. No rectal pain, constipation or straining. No recent diarrhea. Denies fever or chills.     Past Medical History  Diagnosis Date  . Type 2 diabetes mellitus   . Mixed hyperlipidemia   . Essential hypertension, benign   . Dementia   . BPH (benign prostatic hypertrophy)   . Pulmonary embolism     Diagnosed 7/12  . Deep venous thrombosis     Left leg, diagnosed 7/12  . Osteoarthritis     Past Surgical History  Procedure Date  . Hernia repair   . Neck fusion   . Carpal tunnel release   . Right total knee replacement 04/23/2011  . Colonoscopy 08/31/2012    Procedure: COLONOSCOPY;  Surgeon: Malissa Hippo, MD;  Location: [facility];  Service: Endoscopy;  Laterality: N/A;  830    Family History  Problem Relation Age of Onset  . Cancer Mother     Stomach  . Diabetes Sister   . Hypertension Sister   . Diabetes Brother   . Cancer Brother     Gallbladder    History  Substance Use Topics  . Smoking status: Never Smoker   . Smokeless tobacco: Never Used  . Alcohol Use: No      Review of Systems  Constitutional: Negative for fever and chills.  HENT: Negative for neck pain.   Eyes: Negative for redness.    Respiratory: Negative for cough and shortness of breath.   Cardiovascular: Negative for chest pain.  Gastrointestinal: Positive for hematochezia. Negative for abdominal pain.  Genitourinary: Negative for flank pain.  Musculoskeletal: Negative for back pain.  Skin: Negative for rash.  Neurological: Negative for headaches.  Hematological: Does not bruise/bleed easily.  Psychiatric/Behavioral: Negative for confusion.    Allergies  Review of patient's allergies indicates no known allergies.  Home Medications   Current Outpatient Rx  Name  Route  Sig  Dispense  Refill  . ACETAMINOPHEN 325 MG PO TABS   Oral   Take 650 mg by mouth every 6 (six) hours as needed. Pain.         . ALBUTEROL SULFATE HFA 108 (90 BASE) MCG/ACT IN AERS   Inhalation   Inhale 1-2 puffs into the lungs every 6 (six) hours as needed for wheezing.   1 Inhaler   0   . AZITHROMYCIN 250 MG PO TABS      One po qd x 4 days   4 each   0   . BENAZEPRIL HCL 10 MG PO TABS   Oral   Take 1 tablet (10 mg total) by mouth daily.   30 tablet   5   . CLOBETASOL PROPIONATE 0.05 % EX CREA   Topical   Apply 1 application  topically 2 (two) times daily.   60 g   1   . DONEPEZIL HCL 10 MG PO TABS   Oral   Take 1 tablet (10 mg total) by mouth at bedtime.   30 tablet   5   . GLUCOSE BLOOD VI STRP   Other   1 each by Other route every other day. Use as instructed         . LOVASTATIN 40 MG PO TABS   Oral   Take 1 tablet (40 mg total) by mouth at bedtime.   30 tablet   5   . METFORMIN HCL 500 MG PO TABS   Oral   Take 1 tablet (500 mg total) by mouth 2 (two) times daily with a meal.   60 tablet   11   . SAXAGLIPTIN HCL 5 MG PO TABS   Oral   Take 1 tablet (5 mg total) by mouth daily.   30 tablet   3   . SILODOSIN 8 MG PO CAPS   Oral   Take 1 capsule (8 mg total) by mouth daily with breakfast.   30 capsule   11   . TRAMADOL HCL 50 MG PO TABS   Oral   Take 1 tablet (50 mg total) by mouth 2 (two)  times daily.   60 tablet   4   . WARFARIN SODIUM 5 MG PO TABS   Oral   Take 2.5-5 mg by mouth daily. 5mg  daily except 2.5mg  Mon & Fri           BP 132/80  Pulse 90  Temp 100 F (37.8 C)  Resp 24  SpO2 97%  Physical Exam  Nursing note and vitals reviewed. Constitutional: He appears well-developed and well-nourished. No distress.  HENT:  Head: Atraumatic.  Nose: Nose normal.  Mouth/Throat: Oropharynx is clear and moist.  Eyes: Conjunctivae normal are normal. No scleral icterus.  Neck: Neck supple. No tracheal deviation present.  Cardiovascular: Normal rate, regular rhythm, normal heart sounds and intact distal pulses.   Pulmonary/Chest: Effort normal and breath sounds normal. No accessory muscle usage. No respiratory distress.  Abdominal: Soft. Bowel sounds are normal. He exhibits no distension and no mass. There is no tenderness. There is no rebound and no guarding.       Bruise to left lower abd, pt notes prior contusion to area. No abd pain or tenderness.   Genitourinary:       Rectal light brown stool, weakly heme pos, no fissure or hemorrhoid seen.   Musculoskeletal: Normal range of motion. He exhibits no edema and no tenderness.  Neurological: He is alert.       Alert, oriented. Ms c/w baseline.   Skin: Skin is warm and dry. He is not diaphoretic.  Psychiatric: He has a normal mood and affect.    ED Course  Procedures (including critical care time)   Labs Reviewed  CBC WITH DIFFERENTIAL  COMPREHENSIVE METABOLIC PANEL  TYPE AND SCREEN    Results for orders placed during the hospital encounter of 12/08/12  CBC WITH DIFFERENTIAL      Component Value Range   WBC 9.7  4.0 - 10.5 K/uL   RBC 2.71 (*) 4.22 - 5.81 MIL/uL   Hemoglobin 8.1 (*) 13.0 - 17.0 g/dL   HCT 96.2 (*) 95.2 - 84.1 %   MCV 89.7  78.0 - 100.0 fL   MCH 29.9  26.0 - 34.0 pg   MCHC 33.3  30.0 - 36.0 g/dL  RDW 13.7  11.5 - 15.5 %   Platelets 478 (*) 150 - 400 K/uL   Neutrophils Relative 80 (*)  43 - 77 %   Neutro Abs 7.8 (*) 1.7 - 7.7 K/uL   Lymphocytes Relative 11 (*) 12 - 46 %   Lymphs Abs 1.1  0.7 - 4.0 K/uL   Monocytes Relative 8  3 - 12 %   Monocytes Absolute 0.8  0.1 - 1.0 K/uL   Eosinophils Relative 0  0 - 5 %   Eosinophils Absolute 0.0  0.0 - 0.7 K/uL   Basophils Relative 0  0 - 1 %   Basophils Absolute 0.0  0.0 - 0.1 K/uL  COMPREHENSIVE METABOLIC PANEL      Component Value Range   Sodium 136  135 - 145 mEq/L   Potassium 5.4 (*) 3.5 - 5.1 mEq/L   Chloride 103  96 - 112 mEq/L   CO2 21  19 - 32 mEq/L   Glucose, Bld 153 (*) 70 - 99 mg/dL   BUN 20  6 - 23 mg/dL   Creatinine, Ser 6.29 (*) 0.50 - 1.35 mg/dL   Calcium 8.9  8.4 - 52.8 mg/dL   Total Protein 7.8  6.0 - 8.3 g/dL   Albumin 2.8 (*) 3.5 - 5.2 g/dL   AST 13  0 - 37 U/L   ALT 8  0 - 53 U/L   Alkaline Phosphatase 72  39 - 117 U/L   Total Bilirubin 1.2  0.3 - 1.2 mg/dL   GFR calc non Af Amer 43 (*) >90 mL/min   GFR calc Af Amer 50 (*) >90 mL/min  TYPE AND SCREEN      Component Value Range   ABO/RH(D) A POS     Antibody Screen NEG     Sample Expiration 12/11/2012    PROTIME-INR      Component Value Range   Prothrombin Time 48.3 (*) 11.6 - 15.2 seconds   INR 5.82 (*) 0.00 - 1.49   Dg Chest 2 View  12/01/2012  *RADIOLOGY REPORT*  Clinical Data: Cough  CHEST - 2 VIEW  Comparison: 06/21/2011  Findings: Bibasilar curvilinear scarring or atelectasis again noted.  Heart size is mildly enlarged without evidence for overt edema.  No pleural effusion.  No acute osseous finding.  IMPRESSION: Bibasilar scarring or atelectasis again noted.  No new acute finding.   Original Report Authenticated By: Christiana Pellant, M.D.    Ct Angio Chest Pe W/cm &/or Wo Cm  12/01/2012  *RADIOLOGY REPORT*  Clinical Data: Productive cough.  Chest congestion.  Chest pain.  CT ANGIOGRAPHY CHEST  Technique:  Multidetector CT imaging of the chest using the standard protocol during bolus administration of intravenous contrast. Multiplanar  reconstructed images including MIPs were obtained and reviewed to evaluate the vascular anatomy.  Contrast: OMNIPAQUE IOHEXOL 350 MG/ML SOLN  Comparison: Chest radiographs obtained earlier today.  Chest CT dated 02/17/2004.  Findings: Stable enlarged and inhomogeneous thyroid gland.  Focal dilatation of the anterior portion of the azygos arch is better demonstrate with today's thin section images.  No enlarged lymph node is seen at that location or elsewhere.  No lung masses.  Small sliding hiatal hernia.  Normally opacified pulmonary arteries with no pulmonary arterial filling defects seen.  Bibasilar atelectasis/scarring, with progression.  Thoracic spine degenerative changes, including changes of DISH.  Unremarkable upper abdomen.  IMPRESSION:  1.  No pulmonary emboli. 2.  Bibasilar atelectasis/scarring. 3.  Stable goiter.   Original Report Authenticated  By: Beckie Salts, M.D.       MDM  Iv ns. Labs.  Reviewed nursing notes and prior charts for additional history.    hgb 8, decreased from prior.   Transfuse 2 units prbc. Med service called to admit.        Suzi Roots, MD 12/08/12 [phone]

## 2012-12-08 NOTE — ED Notes (Signed)
Blood completed  No signs or symptoms of reactions.

## 2012-12-09 ENCOUNTER — Encounter (HOSPITAL_COMMUNITY): Payer: Self-pay | Admitting: Nurse Practitioner

## 2012-12-09 LAB — GLUCOSE, CAPILLARY
Glucose-Capillary: 119 mg/dL — ABNORMAL HIGH (ref 70–99)
Glucose-Capillary: 127 mg/dL — ABNORMAL HIGH (ref 70–99)
Glucose-Capillary: 145 mg/dL — ABNORMAL HIGH (ref 70–99)

## 2012-12-09 LAB — BASIC METABOLIC PANEL
BUN: 19 mg/dL (ref 6–23)
Chloride: 106 mEq/L (ref 96–112)
GFR calc Af Amer: 53 mL/min — ABNORMAL LOW (ref >90)
Glucose, Bld: 126 mg/dL — ABNORMAL HIGH (ref 70–99)
Potassium: 5 mEq/L (ref 3.5–5.1)

## 2012-12-09 LAB — PROTIME-INR: INR: 1.93 — ABNORMAL HIGH (ref 0.00–1.49)

## 2012-12-09 LAB — CBC
HCT: 27.8 % — ABNORMAL LOW (ref 39.0–52.0)
Hemoglobin: 9.5 g/dL — ABNORMAL LOW (ref 13.0–17.0)
RDW: 14.3 % (ref 11.5–15.5)
WBC: 9.9 10*3/uL (ref 4.0–10.5)

## 2012-12-09 LAB — HEMOGLOBIN AND HEMATOCRIT, BLOOD: HCT: 28.1 % — ABNORMAL LOW (ref 39.0–52.0)

## 2012-12-09 MED ORDER — SODIUM CHLORIDE 0.9 % IV SOLN
INTRAVENOUS | Status: AC
  Administered 2012-12-09: 17:00:00 via INTRAVENOUS

## 2012-12-09 MED ORDER — INSULIN ASPART 100 UNIT/ML ~~LOC~~ SOLN: 0.0000 [IU] | Freq: Every day | SUBCUTANEOUS | Status: DC

## 2012-12-09 MED ORDER — INSULIN ASPART 100 UNIT/ML ~~LOC~~ SOLN: 0.0000 [IU] | Freq: Three times a day (TID) | SUBCUTANEOUS | Status: DC

## 2012-12-09 NOTE — Progress Notes (Signed)
[facility] PROGRESS NOTE  Samuel Ryan ZOX:[id number] DOB: 09/22/1931 DOA: 12/08/2012 PCP: Syliva Overman, MD  Assessment/Plan: Lower GI bleed  Etiology unclear, suspect is likely due to supratherapeutic INR with hemorrhoidal or diverticular bleed.  Bleeding seems to have resolved.  Patient transfused 2 units of PRBC.  Patient had Ryan colonoscopy on 08/31/2012 which showed sigmoid diverticulosis and external hemorrhoids otherwise normal colonoscopy.  Patient given one dose of vitamin K.  Continue to hold Coumadin.  Continue to monitor on telemetry.  Discussed with Dr. Christella Hartigan, gastroenterology, given recent colonoscopy and supratherapeutic INR recommended conservative management.  If has further recurrences may need Ryan GI evaluation at that time.  Acute blood loss anemia Patient transfused 2 units of PRBC.  Supratherapeutic INR  Continue to hold Coumadin.  Given one dose of vitamin K 10 mg.  INR improved to 1.9.  INR elevated likely due to recent antibiotic (azithromycin).  H/o DVT and PE Hold anticoagulation on Coumadin.  DM2 Discontinue metformin.  Linagliptin substituted for saxagliption. SSI.   Hyperlipidemia Continue statin.  Hypertension Continue benazepril.  Dementia Continue donepezil.  BPH Continue flomax  Prophylaxis SCDs  Generalized weakness Requests PT evaluation.  May benefit from home health physical therapy at discharge.  Chronic kidney disease stage III Renal function at baseline.  Creatinine ranging from 1.05-1.42 in the past.  Code Status: Full code Family Communication: Wife and 2 sons at bedside. Disposition Plan: Pending.   Consultants:  None  Procedures:  None  Antibiotics:  None  HPI/Subjective: No specific concerns.  Denies any pain.  Objective: Filed Vitals:   12/09/12 0129 12/09/12 0229 12/09/12 0327 12/09/12 0359  BP: 122/68 129/73 138/74 128/69  Pulse: 90 93 90 80  Temp: 98.5 F (36.9 C) 98.3 F (36.8 C) 98.8 F  (37.1 C) 98.2 F (36.8 C)  TempSrc: Oral Oral Oral Oral  Resp: 20 22 20 20   Height:      Weight:      SpO2:        Intake/Output Summary (Last 24 hours) at 12/09/12 1206 Last data filed at 12/09/12 1144  Gross per 24 hour  Intake 2359.25 ml  Output    700 ml  Net 1659.25 ml   Filed Weights   12/09/12 0000  Weight: 107 kg (235 lb 14.3 oz)    Exam: Physical Exam: General: Awake, Oriented, No acute distress. HEENT: EOMI. Neck: Supple CV: S1 and S2 Lungs: Clear to ascultation bilaterally Abdomen: Soft, Nontender, Nondistended, +bowel sounds. Ext: Good pulses. Trace edema.  Data Reviewed: Basic Metabolic Panel:  Lab 12/09/12 8119 12/08/12 1824  NA 139 136  K 5.0 5.4*  CL 106 103  CO2 21 21  GLUCOSE 126* 153*  BUN 19 20  CREATININE 1.40* 1.48*  CALCIUM 8.6 8.9  MG -- --  PHOS -- --   Liver Function Tests:  Lab 12/08/12 1824  AST 13  ALT 8  ALKPHOS 72  BILITOT 1.2  PROT 7.8  ALBUMIN 2.8*   No results found for this basename: LIPASE:5,AMYLASE:5 in the last 168 hours No results found for this basename: AMMONIA:5 in the last 168 hours CBC:  Lab 12/09/12 0535 12/09/12 0009 12/08/12 1824  WBC 9.9 -- 9.7  NEUTROABS -- -- 7.8*  HGB 9.3*9.5* 8.8* 8.1*  HCT 28.1*27.8* 26.6* 24.3*  MCV 87.7 -- 89.7  PLT 421* -- 478*   Cardiac Enzymes: No results found for this basename: CKTOTAL:5,CKMB:5,CKMBINDEX:5,TROPONINI:5 in the last 168 hours BNP (last 3 results)  Basename 12/01/12 2042  PROBNP 160.2   CBG:  Lab 12/09/12 1114 12/09/12 0635 12/09/12 0013  GLUCAP 119* 111* 127*    Recent Results (from the past 240 hour(s))  URINE CULTURE     Status: Normal   Collection Time   12/01/12  8:39 PM      Component Value Range Status Comment   Specimen Description URINE, CATHETERIZED   Final    Special Requests NONE   Final    Culture  Setup Time 12/02/2012 02:29   Final    Colony Count NO GROWTH   Final    Culture NO GROWTH   Final    Report Status 12/03/2012  FINAL   Final   CULTURE, BLOOD (ROUTINE X 2)     Status: Normal   Collection Time   12/01/12  8:42 PM      Component Value Range Status Comment   Specimen Description BLOOD RIGHT ANTECUBITAL   Final    Special Requests BOTTLES DRAWN AEROBIC AND ANAEROBIC 5 CC EACH   Final    Culture  Setup Time 12/02/2012 02:09   Final    Culture NO GROWTH 5 DAYS   Final    Report Status 12/08/2012 FINAL   Final   CULTURE, BLOOD (ROUTINE X 2)     Status: Normal   Collection Time   12/01/12  9:05 PM      Component Value Range Status Comment   Specimen Description BLOOD LEFT ARM   Final    Special Requests BOTTLES DRAWN AEROBIC AND ANAEROBIC   Final    Culture  Setup Time 12/02/2012 02:09   Final    Culture NO GROWTH 5 DAYS   Final    Report Status 12/08/2012 FINAL   Final      Studies: No results found.  Scheduled Meds:   . benazepril  10 mg Oral Daily  . donepezil  10 mg Oral QHS  . linagliptin  5 mg Oral Daily  . metFORMIN  500 mg Oral BID WC  . simvastatin  20 mg Oral q1800  . sodium chloride  3 mL Intravenous Q12H  . Tamsulosin HCl  0.4 mg Oral Daily  . traMADol  50 mg Oral BID   Continuous Infusions:   . sodium chloride 20 mL/hr (12/09/12 1153)  . sodium chloride 75 mL/hr at 12/09/12 0026    Principal Problem:  *Lower GI bleed Active Problems:  DM  Mixed hyperlipidemia  DEMENTIA  Essential hypertension, benign  BENIGN PROSTATIC HYPERTROPHY, HX OF  Long term (current) use of anticoagulants  Bleeding on Coumadin   Samuel Ryan  [facility] Pager [phone]. If 7PM-7AM, please contact night-coverage at [email or web address], password [facility] 12/09/2012, 12:06 PM  LOS: 1 day

## 2012-12-10 LAB — TYPE AND SCREEN
ABO/RH(D): A POS
Unit division: 0

## 2012-12-10 LAB — CBC
HCT: 25.5 % — ABNORMAL LOW (ref 39.0–52.0)
MCH: 29.4 pg (ref 26.0–34.0)
MCH: 29.6 pg (ref 26.0–34.0)
MCV: 88.2 fL (ref 78.0–100.0)
MCV: 89.3 fL (ref 78.0–100.0)
Platelets: 425 10*3/uL — ABNORMAL HIGH (ref 150–400)
Platelets: 406 10*3/uL — ABNORMAL HIGH (ref 150–400)
RBC: 2.89 MIL/uL — ABNORMAL LOW (ref 4.22–5.81)
RDW: 14.6 % (ref 11.5–15.5)
RDW: 14.4 % (ref 11.5–15.5)

## 2012-12-10 LAB — BASIC METABOLIC PANEL
BUN: 19 mg/dL (ref 6–23)
CO2: 20 mEq/L (ref 19–32)
Creatinine, Ser: 1.41 mg/dL — ABNORMAL HIGH (ref 0.50–1.35)
GFR calc Af Amer: 53 mL/min — ABNORMAL LOW (ref >90)
GFR calc non Af Amer: 45 mL/min — ABNORMAL LOW (ref >90)

## 2012-12-10 LAB — PROTIME-INR: INR: 1.57 — ABNORMAL HIGH (ref 0.00–1.49)

## 2012-12-10 NOTE — Progress Notes (Signed)
NCM spoke to pt's son and pt had [facility] in the past. Requesting [facility] for [facility] PT. Faxed orders and facesheet to [facility] for scheduled d/c home today with [facility]. No DME is needed at this time. Isidoro Donning RN CCM Case Mgmt phone [phone]

## 2012-12-10 NOTE — Progress Notes (Signed)
Family verbalized understanding of discharge instructions.  Transported home by son.

## 2012-12-10 NOTE — Progress Notes (Signed)
Pt. D/C home with wife. Discharge instructions given by RN caring for the patient. Wife verbalized understanding of discharge instruction given. Pt condition stable.

## 2012-12-10 NOTE — Progress Notes (Signed)
[facility] PROGRESS NOTE  TREVAR BOEHRINGER ZOX:[id number] DOB: 12-27-1930 DOA: 12/08/2012 PCP: Syliva Overman, MD  Assessment/Plan: Lower GI bleed  Etiology unclear, suspect is likely due to supratherapeutic INR with hemorrhoidal or diverticular bleed.  Bleeding seems to have resolved.  Patient transfused 2 units of PRBC.  Patient had Ryan colonoscopy on 08/31/2012 which showed sigmoid diverticulosis and external hemorrhoids otherwise normal colonoscopy.  Patient given one dose of vitamin K in the ER, did not require FFP.  Continue to hold Coumadin.  Continue to monitor on telemetry.  Discussed with Dr. Christella Hartigan, gastroenterology, given recent colonoscopy and supratherapeutic INR recommended conservative management.  Given no further bleeding, will benefit from following up with his gastroenterologist Dr. Karilyn Cota. Dr. Christella Hartigan recommended hold coumadin for 5 days.  Acute blood loss anemia Patient transfused 2 units of PRBC. Hemoglobin stable.  Supratherapeutic INR  Resolved. Continue to hold Coumadin.  Given one dose of vitamin K 10 mg on admission.  INR elevated likely due to recent antibiotic (azithromycin).  H/o DVT and PE Hold anticoagulation on Coumadin. Resume in 5 days. Risks and benefits of Rivaroxaban versus coumadin were discussed with patient and family. Will defer mode of anticoagulation to his PCP.  DM2 Discontinue metformin.  Linagliptin substituted for saxagliption. SSI.   Hyperlipidemia Continue statin.  Hypertension Continue benazepril.  Dementia Continue donepezil.  BPH Continue flomax  Prophylaxis SCDs  Generalized weakness Arrange for home health PT at discharge.  Chronic kidney disease stage III Renal function at baseline.  Creatinine ranging from 1.05-1.42 in the past.  Code Status: Full code Family Communication: Wife and son at bedside. Disposition Plan: DC  today   Consultants:  None  Procedures:  None  Antibiotics:  None  HPI/Subjective: No further bleeding. No specific concerns.  Objective: Filed Vitals:   12/09/12 0359 12/09/12 1409 12/09/12 2035 12/10/12 0503  BP: 128/69 128/68 132/66 141/68  Pulse: 80 87 83 82  Temp: 98.2 F (36.8 C) 97.4 F (36.3 C) 98.4 F (36.9 C) 98.1 F (36.7 C)  TempSrc: Oral Oral Oral Oral  Resp: 20 22 20 20   Height:      Weight:    107.4 kg (236 lb 12.4 oz)  SpO2:  98% 99% 97%    Intake/Output Summary (Last 24 hours) at 12/10/12 1011 Last data filed at 12/10/12 0610  Gross per 24 hour  Intake    823 ml  Output   1450 ml  Net   -627 ml   Filed Weights   12/09/12 0000 12/10/12 0503  Weight: 107 kg (235 lb 14.3 oz) 107.4 kg (236 lb 12.4 oz)    Exam: Physical Exam: General: Awake, Oriented, No acute distress. HEENT: EOMI. Neck: Supple CV: S1 and S2 Lungs: Clear to ascultation bilaterally Abdomen: Soft, Nontender, Nondistended, +bowel sounds. Ext: Good pulses. Trace edema.  Data Reviewed: Basic Metabolic Panel:  Lab 12/10/12 8119 12/09/12 0535 12/08/12 1824  NA 138 139 136  K 4.9 5.0 5.4*  CL 107 106 103  CO2 20 21 21   GLUCOSE 127* 126* 153*  BUN 19 19 20   CREATININE 1.41* 1.40* 1.48*  CALCIUM 8.5 8.6 8.9  MG -- -- --  PHOS -- -- --   Liver Function Tests:  Lab 12/08/12 1824  AST 13  ALT 8  ALKPHOS 72  BILITOT 1.2  PROT 7.8  ALBUMIN 2.8*   No results found for this basename: LIPASE:5,AMYLASE:5 in the last 168 hours No results found for this basename: AMMONIA:5 in the last 168 hours  CBC:  Lab 12/10/12 0525 12/09/12 0535 12/09/12 0009 12/08/12 1824  WBC 6.9 9.9 -- 9.7  NEUTROABS -- -- -- 7.8*  HGB 8.5* 9.3*9.5* 8.8* 8.1*  HCT 25.5* 28.1*27.8* 26.6* 24.3*  MCV 88.2 87.7 -- 89.7  PLT 425* 421* -- 478*   Cardiac Enzymes: No results found for this basename: CKTOTAL:5,CKMB:5,CKMBINDEX:5,TROPONINI:5 in the last 168 hours BNP (last 3 results)  Basename  12/01/12 2042  PROBNP 160.2   CBG:  Lab 12/10/12 0657 12/09/12 2106 12/09/12 1624 12/09/12 1114 12/09/12 0635  GLUCAP 117* 145* 111* 119* 111*    Recent Results (from the past 240 hour(s))  URINE CULTURE     Status: Normal   Collection Time   12/01/12  8:39 PM      Component Value Range Status Comment   Specimen Description URINE, CATHETERIZED   Final    Special Requests NONE   Final    Culture  Setup Time 12/02/2012 02:29   Final    Colony Count NO GROWTH   Final    Culture NO GROWTH   Final    Report Status 12/03/2012 FINAL   Final   CULTURE, BLOOD (ROUTINE X 2)     Status: Normal   Collection Time   12/01/12  8:42 PM      Component Value Range Status Comment   Specimen Description BLOOD RIGHT ANTECUBITAL   Final    Special Requests BOTTLES DRAWN AEROBIC AND ANAEROBIC 5 CC EACH   Final    Culture  Setup Time 12/02/2012 02:09   Final    Culture NO GROWTH 5 DAYS   Final    Report Status 12/08/2012 FINAL   Final   CULTURE, BLOOD (ROUTINE X 2)     Status: Normal   Collection Time   12/01/12  9:05 PM      Component Value Range Status Comment   Specimen Description BLOOD LEFT ARM   Final    Special Requests BOTTLES DRAWN AEROBIC AND ANAEROBIC   Final    Culture  Setup Time 12/02/2012 02:09   Final    Culture NO GROWTH 5 DAYS   Final    Report Status 12/08/2012 FINAL   Final      Studies: No results found.  Scheduled Meds:    . benazepril  10 mg Oral Daily  . donepezil  10 mg Oral QHS  . insulin aspart  0-5 Units Subcutaneous QHS  . insulin aspart  0-9 Units Subcutaneous TID WC  . linagliptin  5 mg Oral Daily  . simvastatin  20 mg Oral q1800  . sodium chloride  3 mL Intravenous Q12H  . Tamsulosin HCl  0.4 mg Oral Daily  . traMADol  50 mg Oral BID   Continuous Infusions:   Principal Problem:  *Lower GI bleed Active Problems:  DM  Mixed hyperlipidemia  DEMENTIA  Essential hypertension, benign  BENIGN PROSTATIC HYPERTROPHY, HX OF  Long term (current) use of  anticoagulants  Bleeding on Coumadin   Samuel Ryan  [facility] Pager [phone]. If 7PM-7AM, please contact night-coverage at [email or web address], password [facility] 12/10/2012, 10:11 AM  LOS: 2 days

## 2012-12-10 NOTE — Discharge Summary (Signed)
Physician Discharge Summary  Samuel Ryan YNW:[id number] DOB: 07/14/1931 DOA: 12/08/2012  PCP: Syliva Overman, MD  Admit date: 12/08/2012 Discharge date: 12/10/2012  Time spent: 40 minutes  Recommendations for Outpatient Follow-up:  Please followup with Syliva Overman, MD (PCP) on 12/14/2012 or 12/15/2012. Please start coumadin on 12/14/2012. Please discuss with your PCP about Rivaroxaban (Xarelto) versus coumadin at the next clinic visit.  Please followup with Dr. Karilyn Cota (GI) in 1 week.  Home health PT arranged at discharge.  Discharge Diagnoses:  Principal Problem:  *Lower GI bleed Active Problems:  DM  Mixed hyperlipidemia  DEMENTIA  Essential hypertension, benign  BENIGN PROSTATIC HYPERTROPHY, HX OF  Long term (current) use of anticoagulants  Bleeding on Coumadin   Discharge Condition: Stable  Diet recommendation: Diabetic diet.  Filed Weights   12/09/12 0000 12/10/12 0503  Weight: 107 kg (235 lb 14.3 oz) 107.4 kg (236 lb 12.4 oz)    History of present illness:  On admission: "Samuel Ryan is a 79 y.o. male who presents with c/o single episode of BRBPR occuring earlier this afternoon. Patient is on coumadin for a h/o DVT in the past (more than 6 months ago). Patient was put on azithromycin a week or so ago for a URI as well. Patient had a colonoscopy a couple of months ago that showed diverticulosis.   In the ED, HGB was 8.1, 2 units PRBC ordered, had a second bowel movement which thankfully was occult blood negative suggesting that his bleeding has stopped, hospitalist asked to admit."   Hospital Course:  Lower GI bleed  Etiology unclear, suspect is likely due to supratherapeutic INR with hemorrhoidal or diverticular bleed.  Bleeding seems to have resolved.  Patient transfused 2 units of PRBC.  Patient had a colonoscopy on 08/31/2012 which showed sigmoid diverticulosis and external hemorrhoids otherwise normal colonoscopy.  Patient given one dose of vitamin K in the  ER, did not require FFP.  Discussed with Dr. Christella Hartigan, gastroenterology, given recent normal colonoscopy and supratherapeutic INR recommended conservative management.  Given no further bleeding, will benefit from following up with his gastroenterologist Dr. Karilyn Cota as outpatient. Dr. Christella Hartigan recommended hold coumadin for 5 days and to start on 12/14/2012. Patient was instructed that if he has any further bleeding he is to come back to the ER or go his GI.  Acute blood loss anemia due to GI bleed Patient transfused 2 units of PRBC during the hospital stay. Hemoglobin stable.  Supratherapeutic INR  Resolved with one dose of vitamin K 10 mg on admission.  INR elevated likely due to recent antibiotic (azithromycin).  H/o DVT and PE Hold anticoagulation on Coumadin. Resume in 5 days. Risks and benefits of Rivaroxaban versus coumadin were discussed with patient and family. Will defer mode of anticoagulation to his PCP.  DM2 Resume metformin at discharge.  Linagliptin substituted for saxaglipton, resume saxaglipton at discharge. Diabetic diet.  Hyperlipidemia Continue statin.  Hypertension Continue benazepril.  Dementia Continue donepezil.  BPH Continue flomax  Generalized weakness Arrange for home health PT at discharge.  Chronic kidney disease stage III Renal function at baseline.  Creatinine ranging from 1.05-1.42 in the past.  Consultants:  None  Procedures:  None  Antibiotics:  None  Discharge Exam: Filed Vitals:   12/09/12 1409 12/09/12 2035 12/10/12 0503 12/10/12 1020  BP: 128/68 132/66 141/68 124/54  Pulse: 87 83 82 82  Temp: 97.4 F (36.3 C) 98.4 F (36.9 C) 98.1 F (36.7 C) 98.7 F (37.1 C)  TempSrc: Oral Oral  Oral Oral  Resp: 22 20 20 20   Height:      Weight:   107.4 kg (236 lb 12.4 oz)   SpO2: 98% 99% 97% 98%   Discharge Instructions  Discharge Orders    Future Appointments: Provider: Department: Dept Phone: Center:   12/21/2012 11:00 AM [facility] [place] [phone] [id number]   03/08/2013 11:00 AM Kerri Perches, MD [facility] [phone] [facility]     Future Orders Please Complete By Expires   Diet Carb Modified      Increase activity slowly      Discharge instructions      Comments:   Please followup with Syliva Overman, MD (PCP) on 12/14/2012 or 12/15/2012. Please start coumadin on 12/14/2012. Please discuss with your PCP about Rivaroxaban (Xarelto) versus coumadin at the next clinic visit.  Please followup with Dr. Karilyn Cota (GI) in 1 week.       Medication List     As of 12/10/2012  1:36 PM    STOP taking these medications         warfarin 5 MG tablet   Commonly known as: COUMADIN      TAKE these medications         acetaminophen 325 MG tablet   Commonly known as: TYLENOL   Take 650 mg by mouth every 6 (six) hours as needed. Pain.      albuterol 108 (90 BASE) MCG/ACT inhaler   Commonly known as: PROVENTIL HFA;VENTOLIN HFA   Inhale 1-2 puffs into the lungs every 6 (six) hours as needed for wheezing.      benazepril 10 MG tablet   Commonly known as: LOTENSIN   Take 1 tablet (10 mg total) by mouth daily.      donepezil 10 MG tablet   Commonly known as: ARICEPT   Take 1 tablet (10 mg total) by mouth at bedtime.      lovastatin 40 MG tablet   Commonly known as: MEVACOR   Take 1 tablet (40 mg total) by mouth at bedtime.      metFORMIN 500 MG tablet   Commonly known as: GLUCOPHAGE   Take 1 tablet (500 mg total) by mouth 2 (two) times daily with a meal.      saxagliptin HCl 5 MG Tabs tablet   Commonly known as: ONGLYZA   Take 1 tablet (5 mg total) by mouth daily.      silodosin 8 MG Caps capsule   Commonly known as: RAPAFLO   Take 1 capsule (8 mg total) by mouth daily with breakfast.      traMADol 50 MG tablet   Commonly known as: ULTRAM   Take 1 tablet (50 mg total) by mouth 2 (two) times daily.           Follow-up Information    Follow up with  Syliva Overman, MD. Schedule an appointment as soon as possible for a visit in 1 week.   Contact information:   [address] [place] [address] [phone]       Follow up with Malissa Hippo, MD. Schedule an appointment as soon as possible for a visit in 1 week.   Contact information:   [address], [address] [place] [address] [phone]           The results of significant diagnostics from this hospitalization (including imaging, microbiology, ancillary and laboratory) are listed below for reference.    Significant Diagnostic Studies: Dg Chest  2 View  12/01/2012  *RADIOLOGY REPORT*  Clinical Data: Cough  CHEST - 2 VIEW  Comparison: 06/21/2011  Findings: Bibasilar curvilinear scarring or atelectasis again noted.  Heart size is mildly enlarged without evidence for overt edema.  No pleural effusion.  No acute osseous finding.  IMPRESSION: Bibasilar scarring or atelectasis again noted.  No new acute finding.   Original Report Authenticated By: Christiana Pellant, M.D.    Ct Angio Chest Pe W/cm &/or Wo Cm  12/01/2012  *RADIOLOGY REPORT*  Clinical Data: Productive cough.  Chest congestion.  Chest pain.  CT ANGIOGRAPHY CHEST  Technique:  Multidetector CT imaging of the chest using the standard protocol during bolus administration of intravenous contrast. Multiplanar reconstructed images including MIPs were obtained and reviewed to evaluate the vascular anatomy.  Contrast: OMNIPAQUE IOHEXOL 350 MG/ML SOLN  Comparison: Chest radiographs obtained earlier today.  Chest CT dated 02/17/2004.  Findings: Stable enlarged and inhomogeneous thyroid gland.  Focal dilatation of the anterior portion of the azygos arch is better demonstrate with today's thin section images.  No enlarged lymph node is seen at that location or elsewhere.  No lung masses.  Small sliding hiatal hernia.  Normally opacified pulmonary arteries with no pulmonary arterial filling defects seen.  Bibasilar  atelectasis/scarring, with progression.  Thoracic spine degenerative changes, including changes of DISH.  Unremarkable upper abdomen.  IMPRESSION:  1.  No pulmonary emboli. 2.  Bibasilar atelectasis/scarring. 3.  Stable goiter.   Original Report Authenticated By: Beckie Salts, M.D.     Microbiology: Recent Results (from the past 240 hour(s))  URINE CULTURE     Status: Normal   Collection Time   12/01/12  8:39 PM      Component Value Range Status Comment   Specimen Description URINE, CATHETERIZED   Final    Special Requests NONE   Final    Culture  Setup Time 12/02/2012 02:29   Final    Colony Count NO GROWTH   Final    Culture NO GROWTH   Final    Report Status 12/03/2012 FINAL   Final   CULTURE, BLOOD (ROUTINE X 2)     Status: Normal   Collection Time   12/01/12  8:42 PM      Component Value Range Status Comment   Specimen Description BLOOD RIGHT ANTECUBITAL   Final    Special Requests BOTTLES DRAWN AEROBIC AND ANAEROBIC 5 CC EACH   Final    Culture  Setup Time 12/02/2012 02:09   Final    Culture NO GROWTH 5 DAYS   Final    Report Status 12/08/2012 FINAL   Final   CULTURE, BLOOD (ROUTINE X 2)     Status: Normal   Collection Time   12/01/12  9:05 PM      Component Value Range Status Comment   Specimen Description BLOOD LEFT ARM   Final    Special Requests BOTTLES DRAWN AEROBIC AND ANAEROBIC   Final    Culture  Setup Time 12/02/2012 02:09   Final    Culture NO GROWTH 5 DAYS   Final    Report Status 12/08/2012 FINAL   Final      Labs: Basic Metabolic Panel:  Lab 12/10/12 6578 12/09/12 0535 12/08/12 1824  NA 138 139 136  K 4.9 5.0 5.4*  CL 107 106 103  CO2 20 21 21   GLUCOSE 127* 126* 153*  BUN 19 19 20   CREATININE 1.41* 1.40* 1.48*  CALCIUM 8.5 8.6 8.9  MG -- -- --  PHOS -- -- --   Liver Function Tests:  Lab 12/08/12 1824  AST 13  ALT 8  ALKPHOS 72  BILITOT 1.2  PROT 7.8  ALBUMIN 2.8*   No results found for this basename: LIPASE:5,AMYLASE:5 in the last 168  hours No results found for this basename: AMMONIA:5 in the last 168 hours CBC:  Lab 12/10/12 1025 12/10/12 0525 12/09/12 0535 12/09/12 0009 12/08/12 1824  WBC 7.1 6.9 9.9 -- 9.7  NEUTROABS -- -- -- -- 7.8*  HGB 9.4* 8.5* 9.3*9.5* 8.8* 8.1*  HCT 28.4* 25.5* 28.1*27.8* 26.6* 24.3*  MCV 89.3 88.2 87.7 -- 89.7  PLT 406* 425* 421* -- 478*   Cardiac Enzymes: No results found for this basename: CKTOTAL:5,CKMB:5,CKMBINDEX:5,TROPONINI:5 in the last 168 hours BNP: BNP (last 3 results)  Basename 12/01/12 2042  PROBNP 160.2   CBG:  Lab 12/10/12 1235 12/10/12 0657 12/09/12 2106 12/09/12 1624 12/09/12 1114  GLUCAP 98 117* 145* 111* 119*       Signed:  Euriah Matlack A  [facility] 12/10/2012, 1:36 PM

## 2012-12-12 ENCOUNTER — Encounter (HOSPITAL_COMMUNITY): Payer: Self-pay | Admitting: *Deleted

## 2012-12-12 ENCOUNTER — Emergency Department (HOSPITAL_COMMUNITY): Payer: Medicare Other

## 2012-12-12 ENCOUNTER — Inpatient Hospital Stay (HOSPITAL_COMMUNITY): Payer: Medicare Other

## 2012-12-12 ENCOUNTER — Inpatient Hospital Stay (HOSPITAL_COMMUNITY)
Admission: EM | Admit: 2012-12-12 | Discharge: 2012-12-19 | DRG: 872 | Disposition: A | Discharge status: Skilled Nursing Facility | Payer: Medicare Other | Attending: Internal Medicine | Admitting: Internal Medicine

## 2012-12-12 ENCOUNTER — Telehealth: Payer: Self-pay

## 2012-12-12 DIAGNOSIS — R58 Hemorrhage, not elsewhere classified: Secondary | ICD-10-CM

## 2012-12-12 DIAGNOSIS — A419 Sepsis, unspecified organism: Principal | ICD-10-CM | POA: Diagnosis present

## 2012-12-12 DIAGNOSIS — D689 Coagulation defect, unspecified: Secondary | ICD-10-CM

## 2012-12-12 DIAGNOSIS — Z86711 Personal history of pulmonary embolism: Secondary | ICD-10-CM

## 2012-12-12 DIAGNOSIS — F0390 Unspecified dementia without behavioral disturbance: Secondary | ICD-10-CM | POA: Diagnosis present

## 2012-12-12 DIAGNOSIS — Z6830 Body mass index (BMI) 30.0-30.9, adult: Secondary | ICD-10-CM

## 2012-12-12 DIAGNOSIS — I10 Essential (primary) hypertension: Secondary | ICD-10-CM | POA: Diagnosis present

## 2012-12-12 DIAGNOSIS — D72829 Elevated white blood cell count, unspecified: Secondary | ICD-10-CM | POA: Diagnosis present

## 2012-12-12 DIAGNOSIS — M7981 Nontraumatic hematoma of soft tissue: Secondary | ICD-10-CM | POA: Diagnosis present

## 2012-12-12 DIAGNOSIS — M129 Arthropathy, unspecified: Secondary | ICD-10-CM

## 2012-12-12 DIAGNOSIS — N390 Urinary tract infection, site not specified: Secondary | ICD-10-CM

## 2012-12-12 DIAGNOSIS — E782 Mixed hyperlipidemia: Secondary | ICD-10-CM | POA: Diagnosis present

## 2012-12-12 DIAGNOSIS — R509 Fever, unspecified: Secondary | ICD-10-CM | POA: Diagnosis present

## 2012-12-12 DIAGNOSIS — E119 Type 2 diabetes mellitus without complications: Secondary | ICD-10-CM | POA: Diagnosis present

## 2012-12-12 DIAGNOSIS — I2699 Other pulmonary embolism without acute cor pulmonale: Secondary | ICD-10-CM | POA: Diagnosis present

## 2012-12-12 DIAGNOSIS — Z86718 Personal history of other venous thrombosis and embolism: Secondary | ICD-10-CM

## 2012-12-12 DIAGNOSIS — K810 Acute cholecystitis: Secondary | ICD-10-CM | POA: Diagnosis present

## 2012-12-12 DIAGNOSIS — T45515A Adverse effect of anticoagulants, initial encounter: Secondary | ICD-10-CM

## 2012-12-12 DIAGNOSIS — R5381 Other malaise: Secondary | ICD-10-CM | POA: Diagnosis present

## 2012-12-12 DIAGNOSIS — E46 Unspecified protein-calorie malnutrition: Secondary | ICD-10-CM | POA: Diagnosis present

## 2012-12-12 DIAGNOSIS — Z7901 Long term (current) use of anticoagulants: Secondary | ICD-10-CM

## 2012-12-12 DIAGNOSIS — I82409 Acute embolism and thrombosis of unspecified deep veins of unspecified lower extremity: Secondary | ICD-10-CM

## 2012-12-12 DIAGNOSIS — K819 Cholecystitis, unspecified: Secondary | ICD-10-CM

## 2012-12-12 DIAGNOSIS — N400 Enlarged prostate without lower urinary tract symptoms: Secondary | ICD-10-CM | POA: Diagnosis present

## 2012-12-12 LAB — COMPREHENSIVE METABOLIC PANEL
ALT: 11 U/L (ref 0–53)
AST: 15 U/L (ref 0–37)
Albumin: 2.8 g/dL — ABNORMAL LOW (ref 3.5–5.2)
Alkaline Phosphatase: 82 U/L (ref 39–117)
BUN: 15 mg/dL (ref 6–23)
Potassium: 4.8 mEq/L (ref 3.5–5.1)
Sodium: 137 mEq/L (ref 135–145)
Total Protein: 7.7 g/dL (ref 6.0–8.3)

## 2012-12-12 LAB — CBC WITH DIFFERENTIAL/PLATELET
Basophils Absolute: 0 10*3/uL (ref 0.0–0.1)
Basophils Relative: 0 % (ref 0–1)
Eosinophils Absolute: 0 10*3/uL (ref 0.0–0.7)
MCH: 29.6 pg (ref 26.0–34.0)
MCHC: 33.1 g/dL (ref 30.0–36.0)
Neutro Abs: 11 10*3/uL — ABNORMAL HIGH (ref 1.7–7.7)
Neutrophils Relative %: 82 % — ABNORMAL HIGH (ref 43–77)
Platelets: 511 10*3/uL — ABNORMAL HIGH (ref 150–400)
RDW: 13.8 % (ref 11.5–15.5)

## 2012-12-12 LAB — URINALYSIS, ROUTINE W REFLEX MICROSCOPIC
Glucose, UA: NEGATIVE mg/dL
Ketones, ur: NEGATIVE mg/dL
Leukocytes, UA: NEGATIVE
pH: 6.5 (ref 5.0–8.0)

## 2012-12-12 LAB — PROTIME-INR: INR: 1.7 — ABNORMAL HIGH (ref 0.00–1.49)

## 2012-12-12 LAB — PRO B NATRIURETIC PEPTIDE: Pro B Natriuretic peptide (BNP): 534.3 pg/mL — ABNORMAL HIGH (ref 0–450)

## 2012-12-12 LAB — URINE MICROSCOPIC-ADD ON

## 2012-12-12 MED ORDER — PIPERACILLIN-TAZOBACTAM 3.375 G IVPB 30 MIN
3.3750 g | Freq: Once | INTRAVENOUS | Status: AC
  Administered 2012-12-13: 3.375 g via INTRAVENOUS
  Filled 2012-12-12: qty 50

## 2012-12-12 MED ORDER — DEXTROSE 5 % IV SOLN
1.0000 g | Freq: Once | INTRAVENOUS | Status: AC
  Administered 2012-12-12: 1 g via INTRAVENOUS
  Filled 2012-12-12: qty 10

## 2012-12-12 MED ORDER — NITROFURANTOIN MONOHYD MACRO 100 MG PO CAPS: 100.0000 mg | ORAL_CAPSULE | Freq: Two times a day (BID) | ORAL | Status: DC

## 2012-12-12 MED ORDER — ACETAMINOPHEN 325 MG PO TABS
650.0000 mg | ORAL_TABLET | Freq: Once | ORAL | Status: AC
Start: 2012-12-12 — End: 2012-12-12
  Administered 2012-12-12: 650 mg via ORAL
  Filled 2012-12-12: qty 2

## 2012-12-12 MED ORDER — ACETAMINOPHEN 325 MG PO TABS
650.0000 mg | ORAL_TABLET | Freq: Four times a day (QID) | ORAL | Status: DC | PRN
  Administered 2012-12-17: 650 mg via ORAL
  Filled 2012-12-12 (×2): qty 2

## 2012-12-12 MED ORDER — ACETAMINOPHEN 650 MG RE SUPP
650.0000 mg | Freq: Four times a day (QID) | RECTAL | Status: DC | PRN
  Administered 2012-12-13: 650 mg via RECTAL
  Filled 2012-12-12 (×2): qty 1

## 2012-12-12 MED ORDER — NITROFURANTOIN MONOHYD MACRO 100 MG PO CAPS
100.0000 mg | ORAL_CAPSULE | Freq: Once | ORAL | Status: AC
  Administered 2012-12-12: 100 mg via ORAL
  Filled 2012-12-12: qty 1

## 2012-12-12 MED ORDER — SODIUM CHLORIDE 0.9 % IV SOLN
INTRAVENOUS | Status: DC
  Administered 2012-12-12 – 2012-12-16 (×4): via INTRAVENOUS

## 2012-12-12 MED ORDER — SODIUM CHLORIDE 0.9 % IV SOLN
INTRAVENOUS | Status: DC
  Administered 2012-12-13: via INTRAVENOUS

## 2012-12-12 MED ORDER — VANCOMYCIN HCL 10 G IV SOLR
1500.0000 mg | Freq: Once | INTRAVENOUS | Status: AC
  Administered 2012-12-13: 1500 mg via INTRAVENOUS
  Filled 2012-12-12: qty 1500

## 2012-12-12 NOTE — Progress Notes (Signed)
Utilization Review Completed.   Deane Melick, RN, BSN Nurse Case Manager  [phone]  

## 2012-12-12 NOTE — H&P (Addendum)
Hospitalist Admission History and Physical  Patient name: Samuel Ryan Medical record number: [id number] Date of birth: July 19, 1931 Age: 79 y.o. Gender: male  Primary Care Provider: Syliva Overman, MD  Chief Complaint: weakness, fever  History of Present Illness: Samuel Ryan is a 79 y.o. year old male with baseline hx/o dementia, DM, recurrent VTE and recent admission for GI bleed while on coumadin who presents today with weakness, generalized fatigue and fever.  Pt lives at home with wife. Baseline hx/o dementia. Was in otherwise normal state of health yesterday. Wife states that pt was significantly weaker today as well as with decreased appetite. Pt was seen in ER for weakness where pt was noted to have been febrile to 102 as well as having progressive worsening weakness. Wife denies any complaints of SOB, chest pain, diarrhea. Wife denies any confusion.  Pt was noted have been recently hospitalized 1/10-1/12 for lower GI bleed thought to be secondary to supratherapeutic INR. Pt's coumadin was held since this point. No BRBPR. No hematuria.  No recent falls.  Pt has had his flu shot.    Patient Active Problem List  Diagnosis  . Goiter, unspecified  . DM  . Mixed hyperlipidemia  . DEMENTIA  . Essential hypertension, benign  . ARTHRITIS  . BENIGN PROSTATIC HYPERTROPHY, HX OF  . Pulmonary embolus  . Long term (current) use of anticoagulants  . Right ventricular dysfunction  . Deep venous thrombosis  . Heart disease, unspecified  . Trigger finger  . Routine general medical examination at a health care facility  . Lower GI bleed  . Bleeding on Coumadin   Past Medical History: Past Medical History  Diagnosis Date  . Type 2 diabetes mellitus   . Mixed hyperlipidemia   . Essential hypertension, benign   . Dementia   . BPH (benign prostatic hypertrophy)   . Pulmonary embolism     Diagnosed 7/12  . Deep venous thrombosis     Left leg, diagnosed 7/12  . Osteoarthritis   .  Diverticulitis     Past Surgical History: Past Surgical History  Procedure Date  . Hernia repair   . Neck fusion   . Carpal tunnel release   . Right total knee replacement 04/23/2011  . Colonoscopy 08/31/2012    Procedure: COLONOSCOPY;  Surgeon: Malissa Hippo, MD;  Location: [facility];  Service: Endoscopy;  Laterality: N/A;  830    Social History: History   Social History  . Marital Status: Married    Spouse Name: N/A    Number of Children: N/A  . Years of Education: N/A   Social History Main Topics  . Smoking status: Never Smoker   . Smokeless tobacco: Never Used  . Alcohol Use: No  . Drug Use: No  . Sexually Active: None   Other Topics Concern  . None   Social History Narrative  . None    Family History: Family History  Problem Relation Age of Onset  . Cancer Mother     Stomach  . Diabetes Sister   . Hypertension Sister   . Diabetes Brother   . Cancer Brother     Gallbladder    Allergies: No Known Allergies  Current Facility-Administered Medications  Medication Dose Route Frequency Provider Last Rate Last Dose  . 0.9 %  sodium chloride infusion   Intravenous Continuous Gilda Crease, MD 125 mL/hr at 12/12/12 2237    . 0.9 %  sodium chloride infusion   Intravenous Continuous Doree Albee,  MD      . acetaminophen (TYLENOL) tablet 650 mg  650 mg Oral Q6H PRN Doree Albee, MD       Or  . acetaminophen (TYLENOL) suppository 650 mg  650 mg Rectal Q6H PRN Doree Albee, MD      . piperacillin-tazobactam (ZOSYN) IVPB 3.375 g  3.375 g Intravenous Once Gilda Crease, MD      . vancomycin (VANCOCIN) 1,500 mg in sodium chloride 0.9 % 500 mL IVPB  1,500 mg Intravenous Once Gilda Crease, MD       Current Outpatient Prescriptions  Medication Sig Dispense Refill  . acetaminophen (TYLENOL) 325 MG tablet Take 650 mg by mouth every 6 (six) hours as needed. Pain.      . benazepril (LOTENSIN) 10 MG tablet Take 1 tablet (10 mg total) by  mouth daily.  30 tablet  5  . donepezil (ARICEPT) 10 MG tablet Take 1 tablet (10 mg total) by mouth at bedtime.  30 tablet  5  . lovastatin (MEVACOR) 40 MG tablet Take 1 tablet (40 mg total) by mouth at bedtime.  30 tablet  5  . metFORMIN (GLUCOPHAGE) 500 MG tablet Take 1 tablet (500 mg total) by mouth 2 (two) times daily with a meal.  60 tablet  11  . saxagliptin HCl (ONGLYZA) 5 MG TABS tablet Take 1 tablet (5 mg total) by mouth daily.  30 tablet  3  . silodosin (RAPAFLO) 8 MG CAPS capsule Take 1 capsule (8 mg total) by mouth daily with breakfast.  30 capsule  11  . traMADol (ULTRAM) 50 MG tablet Take 1 tablet (50 mg total) by mouth 2 (two) times daily.  60 tablet  4  . warfarin (COUMADIN) 5 MG tablet Take 2.5-5 mg by mouth daily. 1 tab daily except for 0.5 tab on Mondays and Fridays.      [name] albuterol (PROVENTIL HFA;VENTOLIN HFA) 108 (90 BASE) MCG/ACT inhaler Inhale 1-2 puffs into the lungs every 6 (six) hours as needed for wheezing.  1 Inhaler  0  . nitrofurantoin, macrocrystal-monohydrate, (MACROBID) 100 MG capsule Take 1 capsule (100 mg total) by mouth 2 (two) times daily.  20 capsule  0   Review Of Systems: 12 point ROS negative except as noted above in HPI.  Physical Exam:  Filed Vitals:   12/13/12 0007  BP: 152/79  Pulse: 84  Temp:   Resp: 19    General: cooperative and slowed mentation HEENT: PERRLA, extra ocular movement intact, sclera clear, anicteric and oropharynx clear, no lesions Heart: S1, S2 normal, no murmur, rub or gallop, regular rate and rhythm Lungs: clear to auscultation, no wheezes or rales and unlabored breathing Abdomen: + generalized abdominal distension, no focal tenderness Extremities: extremities normal, atraumatic, no cyanosis or edema Skin:no rashes, no ecchymoses, no petechiae Neurology: oriented x3, mildly slowed mentation, no focal neurological deficits.   Labs and Imaging: Lab Results  Component Value Date/Time   NA 137 12/12/2012  4:18 PM   K 4.8  12/12/2012  4:18 PM   CL 104 12/12/2012  4:18 PM   CO2 22 12/12/2012  4:18 PM   BUN 15 12/12/2012  4:18 PM   CREATININE 1.27 12/12/2012  4:18 PM   CREATININE 1.42* 10/31/2012  9:00 AM   GLUCOSE 182* 12/12/2012  4:18 PM   Lab Results  Component Value Date   WBC 13.4* 12/12/2012   HGB 10.1* 12/12/2012   HCT 30.5* 12/12/2012   MCV 89.4 12/12/2012   PLT 511* 12/12/2012  Dg Chest 2 View  12/12/2012  *RADIOLOGY REPORT*  Clinical Data: Fever, weakness  CHEST - 2 VIEW  Comparison: 12/01/2012  Findings: Enlargement of cardiac silhouette. Tortuous aorta. Pulmonary vascularity normal. Chronic bibasilar atelectasis versus scarring. Remaining lungs clear. No pleural effusion or pneumothorax. Scattered endplate spur formation thoracic spine.  IMPRESSION: Enlargement of cardiac silhouette. Chronic bibasilar atelectasis versus scarring.   Original Report Authenticated By: Ulyses Southward, M.D.    Ct Head Wo Contrast  12/12/2012  *RADIOLOGY REPORT*  Clinical Data: Weakness, dementia.  CT HEAD WITHOUT CONTRAST  Technique:  Contiguous axial images were obtained from the base of the skull through the vertex without contrast.  Comparison: 06/21/2011  Findings: Diffuse parenchymal atrophy. Patchy areas of hypoattenuation in deep and periventricular white matter bilaterally. Negative for acute intracranial hemorrhage, mass lesion, acute infarction, midline shift, or mass-effect. Acute infarct may be inapparent on noncontrast CT. Ventricles and sulci symmetric. Bone windows demonstrate no focal lesion.  IMPRESSION:  1. Negative for bleed or other acute intracranial process.  2. Atrophy and nonspecific white matter changes   Original Report Authenticated By: D. Andria Rhein, MD       Assessment and Plan: Samuel Ryan is a 79 y.o. year old male presenting with weakness and fever.  Fever: Differential for this remains relatively broad.Patient with noted leukocytosis at 13 today.  No pneumonia on chest x-ray. Patient satting  greater than 97% on room air and blood pressure is stable which is somewhat reassuring. Urinalysis and microscopic urine is somewhat indicative of a urinary source of symptoms. However, early urosepsis cannot be ruled out given patient's age and comorbidities. Patient has a ready received Rocephin in the ER. However, I will start patient on vancomycin and Zosyn for broad-spectrum coverage. In the interim blood and urine cultures have been obtained. Given the patient does have some abdominal distention with noted hematuria on urinalysis will obtain a CT of the abdomen and pelvis with IV and oral contrast to rule out any type of retroperitoneal bleeding especially in setting of recent admission for a GI bleed. Patient has had a flu shot this year. However will also check a flu PCR. If positive will start patient on Tamiflu. Will place in step down in the interim. Will also check procalcitonin and lactate.   Weakness: Suspect this an acute on chronic issue with number 1# being a precipitating factor. Will also check a pre-albumin and vitamin D level given patient's age. Heart failure is also a consideration given cardiomegaly on CXR and #1. Will check pro-BNP. Will continue with gentle IV fluids. Will check Head CT to r/o acute stroke.   Anticoagulation: Patient is in somewhat of a precarious situation given recent GI bleed in the setting of Coumadin use and hx/o recurrent VTE. INR today is 1.7 which is subtherapeutic but greater than 1.5. VTE reoccurrence is lower on the differential but not completely ruled out. Will obtain a d-dimer and proceed with the appropriate scanning if positive. Otherwise continue withholding Coumadin. Currently there have been no complaints of rectal bleeding. There is some concern for bleeding from a urinary source given hematuria on urinalysis.   Diabetes: SSI. Hold oral home meds.  Dementia: continue aricept.  FEN/GI: NPO.  Prophylaxis: Holding coumadin in setting of recent GI  bleed  Disposition: pending further evaluation.  Code Status: Full Code

## 2012-12-12 NOTE — ED Notes (Signed)
I asked patient if he could provide urine specimen, said he is unable to at this time. Urinal provided.

## 2012-12-12 NOTE — Telephone Encounter (Signed)
Spoke further with wife who staes pt's appetite is not good, and he seems less alert and interactive today than he did yesterday I advised ED eval

## 2012-12-12 NOTE — Progress Notes (Signed)
Utilization Review Completed.   Peta Peachey, RN, BSN Nurse Case Manager  [phone]  

## 2012-12-12 NOTE — ED Provider Notes (Addendum)
History     CSN: [id number]  Arrival date & time 12/12/12  1431   First MD Initiated Contact with Patient 12/12/12 1624      Chief Complaint  Patient presents with  . Weakness  . Fever    (Consider location/radiation/quality/duration/timing/severity/associated sxs/prior treatment) HPI Comments: Patient comes to the ER for evaluation of weakness and fever. Patient reports that he has been sick for about 2 weeks. He says his been seen in the ER twice before. In the last couple of days he has had nonproductive cough and a fever as high as 103. He does report that he has had urinary frequency and dysuria. He has not had any nausea, vomiting or diarrhea. Patient denies chest pain and difficulty breathing. He has not abdominal pain or discomfort.  Patient is a 79 y.o. male presenting with weakness and fever.  Weakness The primary symptoms include fever.  Additional symptoms include weakness.  Fever Primary symptoms of the febrile illness include fever and cough. Primary symptoms do not include abdominal pain.    Past Medical History  Diagnosis Date  . Type 2 diabetes mellitus   . Mixed hyperlipidemia   . Essential hypertension, benign   . Dementia   . BPH (benign prostatic hypertrophy)   . Pulmonary embolism     Diagnosed 7/12  . Deep venous thrombosis     Left leg, diagnosed 7/12  . Osteoarthritis   . Diverticulitis     Past Surgical History  Procedure Date  . Hernia repair   . Neck fusion   . Carpal tunnel release   . Right total knee replacement 04/23/2011  . Colonoscopy 08/31/2012    Procedure: COLONOSCOPY;  Surgeon: Malissa Hippo, MD;  Location: [facility];  Service: Endoscopy;  Laterality: N/A;  830    Family History  Problem Relation Age of Onset  . Cancer Mother     Stomach  . Diabetes Sister   . Hypertension Sister   . Diabetes Brother   . Cancer Brother     Gallbladder    History  Substance Use Topics  . Smoking status: Never Smoker   .  Smokeless tobacco: Never Used  . Alcohol Use: No      Review of Systems  Constitutional: Positive for fever.  HENT: Positive for congestion and rhinorrhea.   Respiratory: Positive for cough.   Cardiovascular: Positive for chest pain.  Gastrointestinal: Negative for abdominal pain.  Neurological: Positive for weakness.  All other systems reviewed and are negative.    Allergies  Review of patient's allergies indicates no known allergies.  Home Medications   Current Outpatient Rx  Name  Route  Sig  Dispense  Refill  . ACETAMINOPHEN 325 MG PO TABS   Oral   Take 650 mg by mouth every 6 (six) hours as needed. Pain.         [name] BENAZEPRIL HCL 10 MG PO TABS   Oral   Take 1 tablet (10 mg total) by mouth daily.   30 tablet   5   . DONEPEZIL HCL 10 MG PO TABS   Oral   Take 1 tablet (10 mg total) by mouth at bedtime.   30 tablet   5   . LOVASTATIN 40 MG PO TABS   Oral   Take 1 tablet (40 mg total) by mouth at bedtime.   30 tablet   5   . METFORMIN HCL 500 MG PO TABS   Oral   Take 1 tablet (500  mg total) by mouth 2 (two) times daily with a meal.   60 tablet   11   . SAXAGLIPTIN HCL 5 MG PO TABS   Oral   Take 1 tablet (5 mg total) by mouth daily.   30 tablet   3   . SILODOSIN 8 MG PO CAPS   Oral   Take 1 capsule (8 mg total) by mouth daily with breakfast.   30 capsule   11   . TRAMADOL HCL 50 MG PO TABS   Oral   Take 1 tablet (50 mg total) by mouth 2 (two) times daily.   60 tablet   4   . WARFARIN SODIUM 5 MG PO TABS   Oral   Take 2.5-5 mg by mouth daily. 1 tab daily except for 0.5 tab on Mondays and Fridays.         . ALBUTEROL SULFATE HFA 108 (90 BASE) MCG/ACT IN AERS   Inhalation   Inhale 1-2 puffs into the lungs every 6 (six) hours as needed for wheezing.   1 Inhaler   0     BP 136/64  Pulse 88  Temp 98.8 F (37.1 C) (Oral)  Resp 23  SpO2 96%  Physical Exam  Constitutional: He is oriented to person, place, and time. He appears  well-developed and well-nourished. No distress.  HENT:  Head: Normocephalic and atraumatic.  Right Ear: Hearing normal.  Nose: Nose normal.  Mouth/Throat: Oropharynx is clear and moist and mucous membranes are normal.  Eyes: Conjunctivae normal and EOM are normal. Pupils are equal, round, and reactive to light.  Neck: Normal range of motion. Neck supple.  Cardiovascular: Normal rate, regular rhythm, S1 normal and S2 normal.  Exam reveals no gallop and no friction rub.   No murmur heard. Pulmonary/Chest: Effort normal and breath sounds normal. No respiratory distress. He exhibits no tenderness.  Abdominal: Soft. Normal appearance and bowel sounds are normal. There is no hepatosplenomegaly. There is no tenderness. There is no rebound, no guarding, no tenderness at McBurney's point and negative Murphy's sign. No hernia.  Musculoskeletal: Normal range of motion.  Neurological: He is alert and oriented to person, place, and time. He has normal strength. No cranial nerve deficit or sensory deficit. Coordination normal. GCS eye subscore is 4. GCS verbal subscore is 5. GCS motor subscore is 6.  Skin: Skin is warm, dry and intact. No rash noted. No cyanosis.  Psychiatric: He has a normal mood and affect. His speech is normal and behavior is normal. Thought content normal.    ED Course  Procedures (including critical care time)  Labs Reviewed  CBC WITH DIFFERENTIAL - Abnormal; Notable for the following:    WBC 13.4 (*)     RBC 3.41 (*)     Hemoglobin 10.1 (*)     HCT 30.5 (*)     Platelets 511 (*)     Neutrophils Relative 82 (*)     Neutro Abs 11.0 (*)     Lymphocytes Relative 8 (*)     Monocytes Absolute 1.3 (*)     All other components within normal limits  COMPREHENSIVE METABOLIC PANEL - Abnormal; Notable for the following:    Glucose, Bld 182 (*)     Albumin 2.8 (*)     Total Bilirubin 2.0 (*)     GFR calc non Af Amer 52 (*)     GFR calc Af Amer 60 (*)     All other components within  normal limits  URINALYSIS, ROUTINE W REFLEX MICROSCOPIC - Abnormal; Notable for the following:    Color, Urine AMBER (*)  BIOCHEMICALS MAY BE AFFECTED BY COLOR   APPearance CLOUDY (*)     Hgb urine dipstick LARGE (*)     Bilirubin Urine SMALL (*)     Protein, ur 100 (*)     Urobilinogen, UA 4.0 (*)     All other components within normal limits  GLUCOSE, CAPILLARY - Abnormal; Notable for the following:    Glucose-Capillary 168 (*)     All other components within normal limits  PROTIME-INR - Abnormal; Notable for the following:    Prothrombin Time 19.4 (*)     INR 1.70 (*)     All other components within normal limits  URINE MICROSCOPIC-ADD ON - Abnormal; Notable for the following:    Squamous Epithelial / LPF FEW (*)     Bacteria, UA FEW (*)     All other components within normal limits   Dg Chest 2 View  12/12/2012  *RADIOLOGY REPORT*  Clinical Data: Fever, weakness  CHEST - 2 VIEW  Comparison: 12/01/2012  Findings: Enlargement of cardiac silhouette. Tortuous aorta. Pulmonary vascularity normal. Chronic bibasilar atelectasis versus scarring. Remaining lungs clear. No pleural effusion or pneumothorax. Scattered endplate spur formation thoracic spine.  IMPRESSION: Enlargement of cardiac silhouette. Chronic bibasilar atelectasis versus scarring.   Original Report Authenticated By: Ulyses Southward, M.D.      Diagnosis: Fever (by history; UTI    MDM  Patient comes to the ER for evaluation of generalized weakness. Reports that he had a fever at home but he did not have one here. His workup was largely unremarkable. He has had some urinary symptoms and his urinalysis did show some signs of infection. He will be empirically treated for a urinary tract infection. As he appears well and no other findings, he can be treated as an outpatient with followup with his primary doctor. Return to the ER for symptoms worsen.        Gilda Crease, MD 12/12/12 2104  After discharge was  arranged for the patient, patient spiked a fever. Vital signs checked at time of discharge revealed a temperature [place]. Patient was reexamined and he appears much more weak. Patient had difficulty holding his head up and keeping his eyes open. He was administered Tylenol for the fever has defervesced. Blood cultures were ordered and patient started on IV fluids. Patient administered IV Rocephin. Sore still unclear, but would still think urine will be the source the sinus symptoms. Because of the patient's worsening condition, was hospitalist to admit the patient.  Gilda Crease, MD 12/12/12 2212   Date: 12/12/2012  Rate: 93  Rhythm: normal sinus rhythm  QRS Axis: normal  Intervals: normal  ST/T Wave abnormalities: normal  Conduction Disutrbances:none  Narrative Interpretation:   Old EKG Reviewed: none available    Gilda Crease, MD 12/12/12 2252

## 2012-12-12 NOTE — ED Notes (Signed)
Patient c/o fever, generalized weakness, and cough since last week since he was diagnosed with bronchitis last week

## 2012-12-12 NOTE — ED Notes (Signed)
MD at bedside. 

## 2012-12-12 NOTE — ED Notes (Signed)
Pt's wife reports that pt took 2 extra strength Tylenol for fever PTA.

## 2012-12-12 NOTE — ED Notes (Signed)
Pt from home accompanied by wife with reports of fever of 103 and elevated BP obtained by physical therapist that came to work with pt for rehab at home. Pt reports generalized weakness but denies N/V/D, was recently hospitalized for GI bleed and diarrhea as well as abnormal Coumadin levels. Pt's wife reports that pt had to receive a blood transfusion.

## 2012-12-13 ENCOUNTER — Ambulatory Visit: Payer: Medicare Other | Admitting: Family Medicine

## 2012-12-13 ENCOUNTER — Inpatient Hospital Stay (HOSPITAL_COMMUNITY): Payer: Medicare Other

## 2012-12-13 ENCOUNTER — Encounter (HOSPITAL_COMMUNITY): Payer: Self-pay

## 2012-12-13 DIAGNOSIS — K810 Acute cholecystitis: Secondary | ICD-10-CM | POA: Diagnosis present

## 2012-12-13 DIAGNOSIS — M7981 Nontraumatic hematoma of soft tissue: Secondary | ICD-10-CM | POA: Diagnosis present

## 2012-12-13 DIAGNOSIS — R509 Fever, unspecified: Secondary | ICD-10-CM | POA: Diagnosis present

## 2012-12-13 DIAGNOSIS — I82409 Acute embolism and thrombosis of unspecified deep veins of unspecified lower extremity: Secondary | ICD-10-CM

## 2012-12-13 DIAGNOSIS — K5730 Diverticulosis of large intestine without perforation or abscess without bleeding: Secondary | ICD-10-CM | POA: Insufficient documentation

## 2012-12-13 DIAGNOSIS — R109 Unspecified abdominal pain: Secondary | ICD-10-CM

## 2012-12-13 LAB — CBC
HCT: 29.6 % — ABNORMAL LOW (ref 39.0–52.0)
Hemoglobin: 9.6 g/dL — ABNORMAL LOW (ref 13.0–17.0)
Hemoglobin: 10 g/dL — ABNORMAL LOW (ref 13.0–17.0)
MCHC: 33.8 g/dL (ref 30.0–36.0)
MCV: 87.9 fL (ref 78.0–100.0)
Platelets: 426 10*3/uL — ABNORMAL HIGH (ref 150–400)
RBC: 3.13 MIL/uL — ABNORMAL LOW (ref 4.22–5.81)
RBC: 3.4 MIL/uL — ABNORMAL LOW (ref 4.22–5.81)
RBC: 3.35 MIL/uL — ABNORMAL LOW (ref 4.22–5.81)
WBC: 18.7 10*3/uL — ABNORMAL HIGH (ref 4.0–10.5)
WBC: 23.6 10*3/uL — ABNORMAL HIGH (ref 4.0–10.5)
WBC: 22.8 10*3/uL — ABNORMAL HIGH (ref 4.0–10.5)

## 2012-12-13 LAB — GLUCOSE, CAPILLARY
Glucose-Capillary: 176 mg/dL — ABNORMAL HIGH (ref 70–99)
Glucose-Capillary: 144 mg/dL — ABNORMAL HIGH (ref 70–99)

## 2012-12-13 LAB — COMPREHENSIVE METABOLIC PANEL
Albumin: 2.6 g/dL — ABNORMAL LOW (ref 3.5–5.2)
BUN: 13 mg/dL (ref 6–23)
Calcium: 8.7 mg/dL (ref 8.4–10.5)
Creatinine, Ser: 1.23 mg/dL (ref 0.50–1.35)
Potassium: 5 mEq/L (ref 3.5–5.1)
Total Protein: 7.2 g/dL (ref 6.0–8.3)

## 2012-12-13 LAB — INFLUENZA PANEL BY PCR (TYPE A & B)
Influenza A By PCR: NEGATIVE
Influenza B By PCR: NEGATIVE

## 2012-12-13 LAB — HEMOGLOBIN A1C
Hgb A1c MFr Bld: 6.9 % — ABNORMAL HIGH (ref <5.7)
Mean Plasma Glucose: 151 mg/dL — ABNORMAL HIGH (ref <117)

## 2012-12-13 LAB — PROTIME-INR
INR: 1.81 — ABNORMAL HIGH (ref 0.00–1.49)
Prothrombin Time: 20.3 seconds — ABNORMAL HIGH (ref 11.6–15.2)

## 2012-12-13 LAB — TYPE AND SCREEN: ABO/RH(D): A POS

## 2012-12-13 LAB — PROCALCITONIN: Procalcitonin: 0.39 ng/mL

## 2012-12-13 MED ORDER — TAMSULOSIN HCL 0.4 MG PO CAPS
0.4000 mg | ORAL_CAPSULE | Freq: Every day | ORAL | Status: DC
  Administered 2012-12-13 – 2012-12-19 (×7): 0.4 mg via ORAL
  Filled 2012-12-13 (×7): qty 1

## 2012-12-13 MED ORDER — PIPERACILLIN-TAZOBACTAM 3.375 G IVPB
3.3750 g | Freq: Three times a day (TID) | INTRAVENOUS | Status: DC
  Administered 2012-12-13 – 2012-12-16 (×10): 3.375 g via INTRAVENOUS
  Filled 2012-12-13 (×11): qty 50

## 2012-12-13 MED ORDER — IOHEXOL 300 MG/ML  SOLN
100.0000 mL | Freq: Once | INTRAMUSCULAR | Status: AC | PRN
  Administered 2012-12-13: 100 mL via INTRAVENOUS

## 2012-12-13 MED ORDER — IOHEXOL 300 MG/ML  SOLN
50.0000 mL | Freq: Once | INTRAMUSCULAR | Status: AC | PRN
  Administered 2012-12-13: 10 mL

## 2012-12-13 MED ORDER — ACETAMINOPHEN 325 MG PO TABS: 650.0000 mg | ORAL_TABLET | Freq: Four times a day (QID) | ORAL | Status: DC | PRN

## 2012-12-13 MED ORDER — ALBUTEROL SULFATE HFA 108 (90 BASE) MCG/ACT IN AERS: 1.0000 | INHALATION_SPRAY | Freq: Four times a day (QID) | RESPIRATORY_TRACT | Status: DC | PRN

## 2012-12-13 MED ORDER — HYDROMORPHONE HCL PF 1 MG/ML IJ SOLN
1.0000 mg | INTRAMUSCULAR | Status: DC | PRN
  Administered 2012-12-13 – 2012-12-15 (×3): 1 mg via INTRAVENOUS
  Filled 2012-12-13 (×3): qty 1

## 2012-12-13 MED ORDER — TRAMADOL HCL 50 MG PO TABS
50.0000 mg | ORAL_TABLET | Freq: Two times a day (BID) | ORAL | Status: DC
  Administered 2012-12-13 – 2012-12-19 (×14): 50 mg via ORAL
  Filled 2012-12-13 (×22): qty 1

## 2012-12-13 MED ORDER — INSULIN ASPART 100 UNIT/ML ~~LOC~~ SOLN
0.0000 [IU] | Freq: Three times a day (TID) | SUBCUTANEOUS | Status: DC
  Administered 2012-12-13: 2 [IU] via SUBCUTANEOUS
  Administered 2012-12-13 – 2012-12-17 (×3): 1 [IU] via SUBCUTANEOUS
  Administered 2012-12-17: 2 [IU] via SUBCUTANEOUS
  Administered 2012-12-17 – 2012-12-19 (×6): 1 [IU] via SUBCUTANEOUS
  Filled 2012-12-13 (×2): qty 1

## 2012-12-13 MED ORDER — VITAMIN K1 10 MG/ML IJ SOLN
5.0000 mg | Freq: Once | INTRAVENOUS | Status: AC
  Administered 2012-12-13: 5 mg via INTRAVENOUS
  Filled 2012-12-13: qty 0.5

## 2012-12-13 MED ORDER — VANCOMYCIN HCL 10 G IV SOLR
1500.0000 mg | Freq: Every day | INTRAVENOUS | Status: DC
  Administered 2012-12-13 – 2012-12-14 (×2): 1500 mg via INTRAVENOUS
  Filled 2012-12-13 (×2): qty 1500

## 2012-12-13 MED ORDER — DONEPEZIL HCL 10 MG PO TABS
10.0000 mg | ORAL_TABLET | Freq: Every day | ORAL | Status: DC
  Administered 2012-12-13 – 2012-12-18 (×7): 10 mg via ORAL
  Filled 2012-12-13 (×8): qty 1

## 2012-12-13 MED ORDER — FENTANYL CITRATE 0.05 MG/ML IJ SOLN
INTRAMUSCULAR | Status: AC | PRN
  Administered 2012-12-13 (×2): 100 ug via INTRAVENOUS

## 2012-12-13 MED ORDER — FENTANYL CITRATE 0.05 MG/ML IJ SOLN
INTRAMUSCULAR | Status: AC
  Filled 2012-12-13: qty 4

## 2012-12-13 NOTE — Progress Notes (Signed)
PT Cancellation Note  Patient Details Name: Samuel Ryan MRN: [id number] DOB: [birth year]   Cancelled Treatment:    Reason Eval/Treat Not Completed: Medical issues which prohibited therapy Acknowledge that PT order has been discontinued   Donnetta Hail 12/13/2012, 2:14 PM

## 2012-12-13 NOTE — Procedures (Signed)
Successful [place] and fluoroscopic guided placement of 10 Fr cholecystotomy tube.  Small sample of bile sent to the lab for analysis.  No immediate complications.

## 2012-12-13 NOTE — ED Notes (Signed)
Surgery at bedside.

## 2012-12-13 NOTE — ED Notes (Signed)
Patient is resting comfortably. 

## 2012-12-13 NOTE — Progress Notes (Signed)
ANTIBIOTIC CONSULT NOTE - INITIAL  Pharmacy Consult for Vancomycin and Zosyn  Indication: pneumonia  No Known Allergies  Patient Measurements: Height: 6\' 2"  (188 cm) Weight: 240 lb (108.863 kg) IBW/kg (Calculated) : 82.2  Adjusted Body Weight:   Vital Signs: Temp: 100.8 F (38.2 C) (01/15 0227) Temp src: Rectal (01/15 0227) BP: 143/83 mmHg (01/15 0227) Pulse Rate: 95  (01/15 0227) Intake/Output from previous day:   Intake/Output from this shift:    Labs:  [facility] 12/12/12 1618 12/10/12 1025 12/10/12 0525  WBC 13.4* 7.1 6.9  HGB 10.1* 9.4* 8.5*  PLT 511* 406* 425*  LABCREA -- -- --  CREATININE 1.27 -- 1.41*   Estimated Creatinine Clearance: 61 ml/min (by C-G formula based on Cr of 1.27). No results found for this basename: VANCOTROUGH:2,VANCOPEAK:2,VANCORANDOM:2,GENTTROUGH:2,GENTPEAK:2,GENTRANDOM:2,TOBRATROUGH:2,TOBRAPEAK:2,TOBRARND:2,AMIKACINPEAK:2,AMIKACINTROU:2,AMIKACIN:2, in the last 72 hours   Microbiology: Recent Results (from the past 720 hour(s))  URINE CULTURE     Status: Normal   Collection Time   12/01/12  8:39 PM      Component Value Range Status Comment   Specimen Description URINE, CATHETERIZED   Final    Special Requests NONE   Final    Culture  Setup Time 12/02/2012 02:29   Final    Colony Count NO GROWTH   Final    Culture NO GROWTH   Final    Report Status 12/03/2012 FINAL   Final   CULTURE, BLOOD (ROUTINE X 2)     Status: Normal   Collection Time   12/01/12  8:42 PM      Component Value Range Status Comment   Specimen Description BLOOD RIGHT ANTECUBITAL   Final    Special Requests BOTTLES DRAWN AEROBIC AND ANAEROBIC 5 CC EACH   Final    Culture  Setup Time 12/02/2012 02:09   Final    Culture NO GROWTH 5 DAYS   Final    Report Status 12/08/2012 FINAL   Final   CULTURE, BLOOD (ROUTINE X 2)     Status: Normal   Collection Time   12/01/12  9:05 PM      Component Value Range Status Comment   Specimen Description BLOOD LEFT ARM   Final    Special  Requests BOTTLES DRAWN AEROBIC AND ANAEROBIC   Final    Culture  Setup Time 12/02/2012 02:09   Final    Culture NO GROWTH 5 DAYS   Final    Report Status 12/08/2012 FINAL   Final     Medical History: Past Medical History  Diagnosis Date  . Type 2 diabetes mellitus   . Mixed hyperlipidemia   . Essential hypertension, benign   . Dementia   . BPH (benign prostatic hypertrophy)   . Pulmonary embolism     Diagnosed 7/12  . Deep venous thrombosis     Left leg, diagnosed 7/12  . Osteoarthritis   . Diverticulitis     Medications:  Anti-infectives     Start     Dose/Rate Route Frequency Ordered Stop   12/13/12 2200   vancomycin (VANCOCIN) 1,500 mg in sodium chloride 0.9 % 500 mL IVPB        1,500 mg 250 mL/hr over 120 Minutes Intravenous Daily at bedtime 12/13/12 0245     12/13/12 0600   piperacillin-tazobactam (ZOSYN) IVPB 3.375 g        3.375 g 12.5 mL/hr over 240 Minutes Intravenous 3 times per day 12/13/12 0245     12/13/12 0000   piperacillin-tazobactam (ZOSYN) IVPB 3.375 g  3.375 g 100 mL/hr over 30 Minutes Intravenous  Once 12/12/12 2346 12/13/12 0100   12/13/12 0000   vancomycin (VANCOCIN) 1,500 mg in sodium chloride 0.9 % 500 mL IVPB        1,500 mg 250 mL/hr over 120 Minutes Intravenous  Once 12/12/12 2346     12/12/12 2215   cefTRIAXone (ROCEPHIN) 1 g in dextrose 5 % 50 mL IVPB        1 g 100 mL/hr over 30 Minutes Intravenous  Once 12/12/12 2211 12/13/12 0008         Assessment: Patient with PNA.  First dose of antibiotics already given.   Goal of Therapy:  Vancomycin trough level 15-20 mcg/ml Zosyn based on renal function   Plan:  Measure antibiotic drug levels at steady state Follow up culture results Vancomycin 1500mg  iv q24hr Zosyn 3.375g IV Q8H infused over 4hrs.   Darlina Guys, Jacquenette Shone Crowford 12/13/2012,2:47 AM

## 2012-12-13 NOTE — ED Notes (Signed)
Placed pt on droplet precautions per CDC guidelines; discussed this with patient's wife and she understood. Pending influenza PCR.

## 2012-12-13 NOTE — Consult Note (Signed)
Reason for Consult: Cholecystitis/sepsis Referring Physician: Terren Jandreau is an 79 y.o. male.  HPI: Patient is an 79 year old gentleman with a complex history he had bronchitis about a week and a half ago he got a Z-Pak. He then presented to the hospital on 12/08/12 with blood from his rectum. He was found to have an INR of 5.8. He was transfused with 2 units of packed cells and his hemoglobin stabilized. He was discharged home on 12/10/12. Family said he did fine the first day. Yesterday on 12/12/12 he was seen by home health for a PT evaluation. At that time he was rather was lethargic and weak. His blood pressure was elevated temperatures reported to be up to 103. He was ultimately referred to the emergency room and [facility] here for approximately 13 hours. He was admitted with weakness and fever, of note he was off his Coumadin since his admission on 12/08/12. He started complaining of some abdominal pain after he arrived in the ER here at [facility]. His bilirubin was noted to be 2.9 with his other LFTs being normal. WBC on admission was 18,700 and has now gone up to 23,600. INR is 1.81, d-dimer 11.26, Pro calcitonin 0.39, lactic acid 1.2, H./H=9 0.6/27.9. CT scan shows gallbladder distention and inflammatory changes with no opaque stones demonstrated he also has a large inferior rectus hematoma sheath, radiology measurements are 290 x 10.2 x 9 cm.  He also has a diffusely enlarged prostate with bladder wall thickening. We are asked to see in consultation for acute cholecystitis.   Past Medical History  Diagnosis Date  . Type 2 diabetes mellitus   . Mixed hyperlipidemia   . Essential hypertension, benign   . Dementia   . BPH (benign prostatic hypertrophy)   . Pulmonary embolism     Diagnosed 7/12  . Deep venous thrombosis     Left leg, diagnosed 7/12  . Osteoarthritis   . Diverticulitis     Past Surgical History  Procedure Date  . Hernia repair 40 years ago    . Neck  fusion 2000  . Carpal tunnel release   . Right total knee replacement 04/23/2011  . Colonoscopy 08/31/2012    Procedure: COLONOSCOPY;  Surgeon: Malissa Hippo, MD;  Location: [facility];  Service: Endoscopy;  Laterality: N/A;  830    Family History  Problem Relation Age of Onset  . Cancer Mother     Stomach  . Diabetes Sister   . Hypertension Sister   . Diabetes Brother   . Cancer Brother     Gallbladder    Social History:  reports that he has never smoked. He has never used smokeless tobacco. He reports that he does not drink alcohol or use illicit drugs.  Allergies: No Known Allergies  Medications:  Prior to Admission:  Prescriptions prior to admission  Medication Sig Dispense Refill  . acetaminophen (TYLENOL) 325 MG tablet Take 650 mg by mouth every 6 (six) hours as needed. Pain.      . benazepril (LOTENSIN) 10 MG tablet Take 1 tablet (10 mg total) by mouth daily.  30 tablet  5  . donepezil (ARICEPT) 10 MG tablet Take 1 tablet (10 mg total) by mouth at bedtime.  30 tablet  5  . lovastatin (MEVACOR) 40 MG tablet Take 1 tablet (40 mg total) by mouth at bedtime.  30 tablet  5  . metFORMIN (GLUCOPHAGE) 500 MG tablet Take 1 tablet (500 mg total) by mouth 2 (  two) times daily with a meal.  60 tablet  11  . saxagliptin HCl (ONGLYZA) 5 MG TABS tablet Take 1 tablet (5 mg total) by mouth daily.  30 tablet  3  . silodosin (RAPAFLO) 8 MG CAPS capsule Take 1 capsule (8 mg total) by mouth daily with breakfast.  30 capsule  11  . traMADol (ULTRAM) 50 MG tablet Take 1 tablet (50 mg total) by mouth 2 (two) times daily.  60 tablet  4  . warfarin (COUMADIN) 5 MG tablet Take 2.5-5 mg by mouth daily. 1 tab daily except for 0.5 tab on Mondays and Fridays.      [name] albuterol (PROVENTIL HFA;VENTOLIN HFA) 108 (90 BASE) MCG/ACT inhaler Inhale 1-2 puffs into the lungs every 6 (six) hours as needed for wheezing.  1 Inhaler  0   Scheduled:   . donepezil  10 mg Oral QHS  . insulin aspart  0-9 Units  Subcutaneous TID WC  . piperacillin-tazobactam (ZOSYN)  IV  3.375 g Intravenous Q8H  . Tamsulosin HCl  0.4 mg Oral Daily  . traMADol  50 mg Oral BID  . vancomycin  1,500 mg Intravenous QHS   Continuous:   . sodium chloride Stopped (12/13/12 0009)  . sodium chloride 75 mL/hr at 12/13/12 0010   WUJ:[id number], acetaminophen, acetaminophen, albuterol Anti-infectives     Start     Dose/Rate Route Frequency Ordered Stop   12/13/12 2200   vancomycin (VANCOCIN) 1,500 mg in sodium chloride 0.9 % 500 mL IVPB        1,500 mg 250 mL/hr over 120 Minutes Intravenous Daily at bedtime 12/13/12 0245     12/13/12 0600   piperacillin-tazobactam (ZOSYN) IVPB 3.375 g        3.375 g 12.5 mL/hr over 240 Minutes Intravenous 3 times per day 12/13/12 0245     12/13/12 0000   piperacillin-tazobactam (ZOSYN) IVPB 3.375 g        3.375 g 100 mL/hr over 30 Minutes Intravenous  Once 12/12/12 2346 12/13/12 0100   12/13/12 0000   vancomycin (VANCOCIN) 1,500 mg in sodium chloride 0.9 % 500 mL IVPB        1,500 mg 250 mL/hr over 120 Minutes Intravenous  Once 12/12/12 2346 12/13/12 0615   12/12/12 2215   cefTRIAXone (ROCEPHIN) 1 g in dextrose 5 % 50 mL IVPB        1 g 100 mL/hr over 30 Minutes Intravenous  Once 12/12/12 2211 12/13/12 0008          Results for orders placed during the hospital encounter of 12/12/12 (from the past 48 hour(s))  GLUCOSE, CAPILLARY     Status: Abnormal   Collection Time   12/12/12  3:47 PM      Component Value Range Comment   Glucose-Capillary 168 (*) 70 - 99 mg/dL   CBC WITH DIFFERENTIAL     Status: Abnormal   Collection Time   12/12/12  4:18 PM      Component Value Range Comment   WBC 13.4 (*) 4.0 - 10.5 K/uL    RBC 3.41 (*) 4.22 - 5.81 MIL/uL    Hemoglobin 10.1 (*) 13.0 - 17.0 g/dL    HCT 13.0 (*) 86.5 - 52.0 %    MCV 89.4  78.0 - 100.0 fL    MCH 29.6  26.0 - 34.0 pg    MCHC 33.1  30.0 - 36.0 g/dL    RDW 78.4  69.6 - 29.5 %    Platelets 511 (*) 150 -  400 K/uL     Neutrophils Relative 82 (*) 43 - 77 %    Neutro Abs 11.0 (*) 1.7 - 7.7 K/uL    Lymphocytes Relative 8 (*) 12 - 46 %    Lymphs Abs 1.1  0.7 - 4.0 K/uL    Monocytes Relative 10  3 - 12 %    Monocytes Absolute 1.3 (*) 0.1 - 1.0 K/uL    Eosinophils Relative 0  0 - 5 %    Eosinophils Absolute 0.0  0.0 - 0.7 K/uL    Basophils Relative 0  0 - 1 %    Basophils Absolute 0.0  0.0 - 0.1 K/uL   COMPREHENSIVE METABOLIC PANEL     Status: Abnormal   Collection Time   12/12/12  4:18 PM      Component Value Range Comment   Sodium 137  135 - 145 mEq/L    Potassium 4.8  3.5 - 5.1 mEq/L    Chloride 104  96 - 112 mEq/L    CO2 22  19 - 32 mEq/L    Glucose, Bld 182 (*) 70 - 99 mg/dL    BUN 15  6 - 23 mg/dL    Creatinine, Ser 1.61  0.50 - 1.35 mg/dL    Calcium 9.1  8.4 - 09.6 mg/dL    Total Protein 7.7  6.0 - 8.3 g/dL    Albumin 2.8 (*) 3.5 - 5.2 g/dL    AST 15  0 - 37 U/L    ALT 11  0 - 53 U/L    Alkaline Phosphatase 82  39 - 117 U/L    Total Bilirubin 2.0 (*) 0.3 - 1.2 mg/dL    GFR calc non Af Amer 52 (*) >90 mL/min    GFR calc Af Amer 60 (*) >90 mL/min   PROTIME-INR     Status: Abnormal   Collection Time   12/12/12  4:18 PM      Component Value Range Comment   Prothrombin Time 19.4 (*) 11.6 - 15.2 seconds    INR 1.70 (*) 0.00 - 1.49   PRO B NATRIURETIC PEPTIDE     Status: Abnormal   Collection Time   12/12/12  4:18 PM      Component Value Range Comment   Pro B Natriuretic peptide (BNP) 534.3 (*) 0 - 450 pg/mL   URINALYSIS, ROUTINE W REFLEX MICROSCOPIC     Status: Abnormal   Collection Time   12/12/12  7:10 PM      Component Value Range Comment   Color, Urine AMBER (*) YELLOW BIOCHEMICALS MAY BE AFFECTED BY COLOR   APPearance CLOUDY (*) CLEAR    Specific Gravity, Urine 1.020  1.005 - 1.030    pH 6.5  5.0 - 8.0    Glucose, UA NEGATIVE  NEGATIVE mg/dL    Hgb urine dipstick LARGE (*) NEGATIVE    Bilirubin Urine SMALL (*) NEGATIVE    Ketones, ur NEGATIVE  NEGATIVE mg/dL    Protein, ur 045 (*)  NEGATIVE mg/dL    Urobilinogen, UA 4.0 (*) 0.0 - 1.0 mg/dL    Nitrite NEGATIVE  NEGATIVE    Leukocytes, UA NEGATIVE  NEGATIVE   URINE MICROSCOPIC-ADD ON     Status: Abnormal   Collection Time   12/12/12  7:10 PM      Component Value Range Comment   Squamous Epithelial / LPF FEW (*) RARE    RBC / HPF 21-50  <3 RBC/hpf    Bacteria, UA FEW (*) RARE  PROCALCITONIN     Status: Normal   Collection Time   12/12/12 11:59 PM      Component Value Range Comment   Procalcitonin 0.39     D-DIMER, QUANTITATIVE     Status: Abnormal   Collection Time   12/12/12 11:59 PM      Component Value Range Comment   D-Dimer, Quant 11.26 (*) 0.00 - 0.48 ug/mL-FEU   INFLUENZA PANEL BY PCR     Status: Normal   Collection Time   12/13/12 12:28 AM      Component Value Range Comment   Influenza A By PCR NEGATIVE  NEGATIVE    Influenza B By PCR NEGATIVE  NEGATIVE    H1N1 flu by pcr NOT DETECTED  NOT DETECTED   COMPREHENSIVE METABOLIC PANEL     Status: Abnormal   Collection Time   12/13/12  2:25 AM      Component Value Range Comment   Sodium 132 (*) 135 - 145 mEq/L    Potassium 5.0  3.5 - 5.1 mEq/L    Chloride 100  96 - 112 mEq/L    CO2 21  19 - 32 mEq/L    Glucose, Bld 188 (*) 70 - 99 mg/dL    BUN 13  6 - 23 mg/dL    Creatinine, Ser 1.61  0.50 - 1.35 mg/dL    Calcium 8.7  8.4 - 09.6 mg/dL    Total Protein 7.2  6.0 - 8.3 g/dL    Albumin 2.6 (*) 3.5 - 5.2 g/dL    AST 15  0 - 37 U/L    ALT 11  0 - 53 U/L    Alkaline Phosphatase 74  39 - 117 U/L    Total Bilirubin 2.9 (*) 0.3 - 1.2 mg/dL    GFR calc non Af Amer 54 (*) >90 mL/min    GFR calc Af Amer 62 (*) >90 mL/min   PROTIME-INR     Status: Abnormal   Collection Time   12/13/12  2:25 AM      Component Value Range Comment   Prothrombin Time 20.3 (*) 11.6 - 15.2 seconds    INR 1.81 (*) 0.00 - 1.49   CBC     Status: Abnormal   Collection Time   12/13/12  2:25 AM      Component Value Range Comment   WBC 18.7 (*) 4.0 - 10.5 K/uL    RBC 3.13 (*) 4.22 -  5.81 MIL/uL    Hemoglobin 9.6 (*) 13.0 - 17.0 g/dL    HCT 04.5 (*) 40.9 - 52.0 %    MCV 89.1  78.0 - 100.0 fL    MCH 30.7  26.0 - 34.0 pg    MCHC 34.4  30.0 - 36.0 g/dL    RDW 81.1  91.4 - 78.2 %    Platelets 444 (*) 150 - 400 K/uL   LACTIC ACID, PLASMA     Status: Normal   Collection Time   12/13/12  2:25 AM      Component Value Range Comment   Lactic Acid, Venous 1.2  0.5 - 2.2 mmol/L   PREALBUMIN     Status: Abnormal   Collection Time   12/13/12  2:25 AM      Component Value Range Comment   Prealbumin 10.6 (*) 17.0 - 34.0 mg/dL   HEMOGLOBIN N5A     Status: Abnormal   Collection Time   12/13/12  2:25 AM      Component Value Range Comment  Hemoglobin A1C 6.9 (*) <5.7 %    Mean Plasma Glucose 151 (*) <117 mg/dL   GLUCOSE, CAPILLARY     Status: Abnormal   Collection Time   12/13/12  8:02 AM      Component Value Range Comment   Glucose-Capillary 176 (*) 70 - 99 mg/dL    Comment 1 Notify RN     GLUCOSE, CAPILLARY     Status: Abnormal   Collection Time   12/13/12 12:03 PM      Component Value Range Comment   Glucose-Capillary 150 (*) 70 - 99 mg/dL    Comment 1 Notify RN       Dg Chest 2 View  12/12/2012  *RADIOLOGY REPORT*  Clinical Data: Fever, weakness  CHEST - 2 VIEW  Comparison: 12/01/2012  Findings: Enlargement of cardiac silhouette. Tortuous aorta. Pulmonary vascularity normal. Chronic bibasilar atelectasis versus scarring. Remaining lungs clear. No pleural effusion or pneumothorax. Scattered endplate spur formation thoracic spine.  IMPRESSION: Enlargement of cardiac silhouette. Chronic bibasilar atelectasis versus scarring.   Original Report Authenticated By: Ulyses Southward, M.D.    Ct Head Wo Contrast  12/12/2012  *RADIOLOGY REPORT*  Clinical Data: Weakness, dementia.  CT HEAD WITHOUT CONTRAST  Technique:  Contiguous axial images were obtained from the base of the skull through the vertex without contrast.  Comparison: 06/21/2011  Findings: Diffuse parenchymal atrophy. Patchy  areas of hypoattenuation in deep and periventricular white matter bilaterally. Negative for acute intracranial hemorrhage, mass lesion, acute infarction, midline shift, or mass-effect. Acute infarct may be inapparent on noncontrast CT. Ventricles and sulci symmetric. Bone windows demonstrate no focal lesion.  IMPRESSION:  1. Negative for bleed or other acute intracranial process.  2. Atrophy and nonspecific white matter changes   Original Report Authenticated By: D. Andria Rhein, MD    Ct Abdomen Pelvis W Contrast  12/13/2012  *RADIOLOGY REPORT*  Clinical Data: Fever.  Weakness.  Abdominal distension.  Dysuria. Urinary frequency.  White cell count 13.4.  CT ABDOMEN AND PELVIS WITH CONTRAST  Technique:  Multidetector CT imaging of the abdomen and pelvis was performed following the standard protocol during bolus administration of intravenous contrast.  Contrast: OMNIPAQUE IOHEXOL 300 MG/ML  SOLN  Comparison: None.  Findings: There is infiltration and / or atelectasis in both lung bases.  Small esophageal hiatal hernia.  The gallbladder is distended with thickened edematous wall and infiltration in the pericholecystic fat.  No opaque stones are demonstrated.  Changes are consistent with acalculous cholecystitis in the appropriate clinical setting.  The liver, spleen, pancreas, adrenal glands, and retroperitoneal lymph nodes are unremarkable. The kidneys are mildly atrophic.  Left renal cysts.  No hydronephrosis or solid mass identified.  Calcification of the aorta without aneurysm.  The stomach, small bowel, and colon are not abnormally distended.  Infiltration in the fat of the right pericolic gutter extending down from the gallbladder.  No free fluid or free air in the abdomen.  Pelvis:  There is a hematoma in the inferior left rectus abdominous muscle extending to the suprapubic region and right inferior rectus sheath.  The hematoma measures up to about 290.8 x 10.2 x 9 cm. The prostate gland is diffusely  enlarged, measuring 6.6 x 5.7 cm. The bladder is decompressed with suggestion of bladder wall thickening.  Calcification in the seminal vesicles.  Diverticular changes in the sigmoid colon without diverticulitis.  The appendix is normal.  No significant pelvic lymphadenopathy.  Degenerative changes in the lumbar spine.  IMPRESSION: Gallbladder distension and inflammatory  changes suggesting cholecystitis although no opaque stones are demonstrated.  Large inferior rectus sheath hematoma.  Diffuse prostate enlargement. Bladder wall thickening.   Original Report Authenticated By: Burman Nieves, M.D.     Review of Systems  Constitutional: Positive for fever and malaise/fatigue. Negative for weight loss and diaphoresis.       ROS thru wife and son, pt barely spoke thru entire interview. He has dementia worse and more profound since his knee surgery almost 2 years ago.  Functions normally at home.  They take him to church, but not to store, they keep him at home.  He needs a walker to get around the home.  He has very short term memory and fair long term memory.    HENT: Negative.   Eyes:       Wears glasses  Respiratory: Positive for wheezing. Negative for cough, hemoptysis, sputum production and shortness of breath (occasional end of day).   Cardiovascular: Positive for orthopnea and leg swelling (Its actually better now than usual per son and wife.).       No orthopnea, but he sleeps at home in a lift chair because of his back and knee replacement.  Just easier to get up and down.  Gastrointestinal: Positive for abdominal pain (started complaining in the ER less than 24 hours ago.) and diarrhea (last week with GI bleed). Negative for heartburn, nausea, vomiting, constipation, blood in stool and melena.  Genitourinary:       Urine output down per family last 24 hours  Musculoskeletal: Negative.   Skin: Positive for rash (right hip, it's better now.).  Neurological: Negative.  Negative for dizziness,  tingling, tremors, sensory change, speech change, focal weakness, seizures and loss of consciousness.       He's speaking very little, knows it's the first month, doesn't know the year. Knows family.  Endo/Heme/Allergies: Negative.   Psychiatric/Behavioral: Negative.    Blood pressure 140/70, pulse 108, temperature 99.9 F (37.7 C), temperature source Oral, resp. rate 25, height 6\' 2"  (1.88 m), weight 240 lb (108.863 kg), SpO2 93.00%. Physical Exam  Constitutional: He appears well-developed and well-nourished. No distress (really painful right buttocks).       BMI 38 72 inch 280 lbs BP 70/40 after 3 liters of saline up to SBP 90.  HENT:  Head: Normocephalic and atraumatic.  Nose: Nose normal.  Eyes: Conjunctivae normal and EOM are normal. Pupils are equal, round, and reactive to light. Right eye exhibits no discharge. Left eye exhibits discharge. No scleral icterus.  Neck: Normal range of motion. Neck supple. No JVD present. No tracheal deviation present. No thyromegaly present.  Cardiovascular: Regular rhythm.        Tachycardic HR in the 110-120 range  Respiratory: Effort normal and breath sounds normal. No respiratory distress. He has no wheezes. He has no rales. He exhibits no tenderness.       Down some in the bases  GI: Soft. Bowel sounds are normal. He exhibits no distension and no mass. There is no tenderness. There is no rebound and no guarding.       He's not really tender on exam now, but says abdomen hurts.  He has ecchymosis on the left where his seat belt was "to tight."  Musculoskeletal: Normal range of motion. He exhibits edema (trace). He exhibits no tenderness.  Lymphadenopathy:    He has no cervical adenopathy.  Neurological: He is alert. No cranial nerve deficit.  Skin: Skin is warm and dry. No rash noted.  He is not diaphoretic. No erythema. No pallor.  Psychiatric: His behavior is normal.       He's very quiet, will answer questions ask to him, but is content to  have family answer question for him.  Knows family, but seems a bit distant.  Clearly feels bad.  Family says he's normally very talkative.    Assessment/Plan: 1. Abdominal pain with probable a calculus cholecystitis, possible sepsis. 2. Large left rectus sheath hematoma 3. Recent GI bleed with transfusion.  (Hospitalize 1/10 through 12/10/12) 4. History of pulmonary embolus/DVT on chronic Coumadin therapy. 5. Dementia 6. Type 2 diabetes mellitus 7. Hypertension 8. Dyslipidemia 9. Osteoarthritis 9. History of diverticulitis. 10. PCM with a pre-albumen of 10.6. 11. Deconditioning walks with a walker for the last 2 years.  Plan: Currently the patient is somewhat confused, his white count is rising. Urine output is down per the family. He is on appropriate antibiotics. At this point he's not a good surgical candidate, but he may be a candidate for percutaneous drainage of his gallbladder. Dr. Michaell Cowing will look at the studies and make recommendations shortly.  [facility] physician assistant for Dr. Karie Soda  Damarrion Mimbs 12/13/2012, 1:02 PM

## 2012-12-13 NOTE — Progress Notes (Signed)
[facility] ED CM noted CM consult for possible need for home health upon d/c.  CM Reviewed EPIC notes, labs and imaging.  CM noted pt [facility] chose [facility] for [facility].  CM spoke with pt, wife and son at bedside who confirm choice of [facility] for services Wife stated [facility] saw pt on 12/12/12 in his home.  CM provided choices of agencies to wife (pt went to sleep during CM interaction).  CM explained to wife and son that [facility] staff would follow pt while hospitalized to provide needed services CM left a voice message for Darl Pikes, [facility] to have pt followed until d/c for d/c needs  Choices offered  HOME HEALTH AGENCIES SERVING [place]   Agencies that are Medicare-Certified and are affiliated with [facility] Agency  Telephone Number Address  [facility]   [facility] has ownership interest in this company; however, you are under no obligation to use this agency. [phone] or  [phone] [address] [place], [place] [address] [email or web address]   Agencies that are Medicare-Certified and are not affiliated with [facility] Agency Telephone Number Address  [facility] [phone] Fax [phone] [address], [address] [place], [place]  [address] [email or web address]  [facility] [phone] or [phone] Fax [phone] [address] [place], [place] [address] [email or web address]  [facility] [phone] Fax [phone] [address] [place], [place] [address] [email or web address]  [facility] [phone] Fax [phone] [address], [address] [place], [place]  [address] [email or web address]  [facility] [phone] Fax [phone] [address], [address] [place], [place] [address] [email or web address]  [facility] [phone] [address] [place], [place] [address] [email or web address]  [facility] [phone]  [address] [place], [place] [address] [email or web address]  [facility] [phone] or [phone] Fax number [phone] [address], [address] [place], [place]  [address] [email or web address]  [facility] [phone] Fax [phone] [address] [place], [place]  [address]  [facility]  [phone] Fax [phone] [address] [place], [place] [address] [email or web address]

## 2012-12-13 NOTE — H&P (Signed)
Cc: cholecystitis - poor surgical candidate. Request has been made for percutaneous cholecystostomy drain placement.   HPI: see surgical note below and their recommendations :  Sherrie George, PA Physician Assistant Signed  Consult Note 12/13/2012 1:02 PM  Reason for Consult: Cholecystitis/sepsis Referring Physician: Dustyn Dansereau is an 79 y.o. male.   HPI: Patient is an 79 year old gentleman with a complex history he had bronchitis about a week and a half ago he got a Z-Pak. He then presented to the hospital on 12/08/12 with blood from his rectum. He was found to have an INR of 5.8. He was transfused with 2 units of packed cells and his hemoglobin stabilized. He was discharged home on 12/10/12. Family said he did fine the first day. Yesterday on 12/12/12 he was seen by home health for a PT evaluation. At that time he was rather was lethargic and weak. His blood pressure was elevated temperatures reported to be up to 103. He was ultimately referred to the emergency room and [facility] here for approximately 13 hours. He was admitted with weakness and fever, of note he was off his Coumadin since his admission on 12/08/12. He started complaining of some abdominal pain after he arrived in the ER here at [facility]. His bilirubin was noted to be 2.9 with his other LFTs being normal. WBC on admission was 18,700 and has now gone up to 23,600. INR is 1.81, d-dimer 11.26, Pro calcitonin 0.39, lactic acid 1.2, H./H=9 0.6/27.9. CT scan shows gallbladder distention and inflammatory changes with no opaque stones demonstrated he also has a large inferior rectus hematoma sheath, radiology measurements are 290 x 10.2 x 9 cm.  He also has a diffusely enlarged prostate with bladder wall thickening. We are asked to see in consultation for acute cholecystitis.     Past Medical History   Diagnosis  Date   .  Type 2 diabetes mellitus     .  Mixed hyperlipidemia     .  Essential hypertension, benign     .   Dementia     .  BPH (benign prostatic hypertrophy)     .  Pulmonary embolism         Diagnosed 7/12   .  Deep venous thrombosis         Left leg, diagnosed 7/12   .  Osteoarthritis     .  Diverticulitis         Past Surgical History   Procedure  Date   .  Hernia repair 40 years ago      .  Neck fusion  2000   .  Carpal tunnel release     .  Right total knee replacement  04/23/2011   .  Colonoscopy  08/31/2012       Procedure: COLONOSCOPY;  Surgeon: Malissa Hippo, MD;  Location: [facility];  Service: Endoscopy;  Laterality: N/A;  830       Family History   Problem  Relation  Age of Onset   .  Cancer  Mother         Stomach   .  Diabetes  Sister     .  Hypertension  Sister     .  Diabetes  Brother     .  Cancer  Brother         Gallbladder     Social History: reports that he has never smoked. He has never used smokeless tobacco. He reports that he  does not drink alcohol or use illicit drugs.  Allergies: No Known Allergies  Medications:   Prior to Admission:   Prescriptions prior to admission   Medication  Sig  Dispense  Refill   .  acetaminophen (TYLENOL) 325 MG tablet  Take 650 mg by mouth every 6 (six) hours as needed. Pain.         .  benazepril (LOTENSIN) 10 MG tablet  Take 1 tablet (10 mg total) by mouth daily.   30 tablet   5   .  donepezil (ARICEPT) 10 MG tablet  Take 1 tablet (10 mg total) by mouth at bedtime.   30 tablet   5   .  lovastatin (MEVACOR) 40 MG tablet  Take 1 tablet (40 mg total) by mouth at bedtime.   30 tablet   5   .  metFORMIN (GLUCOPHAGE) 500 MG tablet  Take 1 tablet (500 mg total) by mouth 2 (two) times daily with a meal.   60 tablet   11   .  saxagliptin HCl (ONGLYZA) 5 MG TABS tablet  Take 1 tablet (5 mg total) by mouth daily.   30 tablet   3   .  silodosin (RAPAFLO) 8 MG CAPS capsule  Take 1 capsule (8 mg total) by mouth daily with breakfast.   30 capsule   11   .  traMADol (ULTRAM) 50 MG tablet  Take 1 tablet (50 mg total) by mouth 2 (two)  times daily.   60 tablet   4   .  warfarin (COUMADIN) 5 MG tablet  Take 2.5-5 mg by mouth daily. 1 tab daily except for 0.5 tab on Mondays and Fridays.         [name]  albuterol (PROVENTIL HFA;VENTOLIN HFA) 108 (90 BASE) MCG/ACT inhaler  Inhale 1-2 puffs into the lungs every 6 (six) hours as needed for wheezing.   1 Inhaler   0    Scheduled:     .  donepezil   10 mg  Oral  QHS   .  insulin aspart   0-9 Units  Subcutaneous  TID WC   .  piperacillin-tazobactam (ZOSYN)  IV   3.375 g  Intravenous  Q8H   .  Tamsulosin HCl   0.4 mg  Oral  Daily   .  traMADol   50 mg  Oral  BID   .  vancomycin   1,500 mg  Intravenous  QHS    Continuous:     .  sodium chloride  Stopped (12/13/12 0009)   .  sodium chloride  75 mL/hr at 12/13/12 0010    JXB:[id number], acetaminophen, acetaminophen, albuterol Anti-infectives        Start        Dose/Rate  Route  Frequency  Ordered  Stop     12/13/12 2200      vancomycin (VANCOCIN) 1,500 mg in sodium chloride 0.9 % 500 mL IVPB            1,500 mg  250 mL/hr over 120 Minutes  Intravenous  Daily at bedtime  12/13/12 0245        12/13/12 0600      piperacillin-tazobactam (ZOSYN) IVPB 3.375 g           3.375 g  12.5 mL/hr over 240 Minutes  Intravenous  3 times per day  12/13/12 0245        12/13/12 0000      piperacillin-tazobactam (ZOSYN) IVPB 3.375 g  3.375 g  100 mL/hr over 30 Minutes  Intravenous   Once  12/12/12 2346  12/13/12 0100     12/13/12 0000      vancomycin (VANCOCIN) 1,500 mg in sodium chloride 0.9 % 500 mL IVPB            1,500 mg  250 mL/hr over 120 Minutes  Intravenous   Once  12/12/12 2346  12/13/12 0615     12/12/12 2215      cefTRIAXone (ROCEPHIN) 1 g in dextrose 5 % 50 mL IVPB           1 g  100 mL/hr over 30 Minutes  Intravenous   Once  12/12/12 2211  12/13/12 0008                Results for orders placed during the hospital encounter of 12/12/12 (from the past 48 hour(s))   GLUCOSE, CAPILLARY     Status: Abnormal      Collection Time     12/12/12  3:47 PM       Component  Value  Range  Comment     Glucose-Capillary  168 (*)  70 - 99 mg/dL     CBC WITH DIFFERENTIAL     Status: Abnormal     Collection Time     12/12/12  4:18 PM       Component  Value  Range  Comment     WBC  13.4 (*)  4.0 - 10.5 K/uL       RBC  3.41 (*)  4.22 - 5.81 MIL/uL       Hemoglobin  10.1 (*)  13.0 - 17.0 g/dL       HCT  16.1 (*)  09.6 - 52.0 %       MCV  89.4   78.0 - 100.0 fL       MCH  29.6   26.0 - 34.0 pg       MCHC  33.1   30.0 - 36.0 g/dL       RDW  04.5   40.9 - 15.5 %       Platelets  511 (*)  150 - 400 K/uL       Neutrophils Relative  82 (*)  43 - 77 %       Neutro Abs  11.0 (*)  1.7 - 7.7 K/uL       Lymphocytes Relative  8 (*)  12 - 46 %       Lymphs Abs  1.1   0.7 - 4.0 K/uL       Monocytes Relative  10   3 - 12 %       Monocytes Absolute  1.3 (*)  0.1 - 1.0 K/uL       Eosinophils Relative  0   0 - 5 %       Eosinophils Absolute  0.0   0.0 - 0.7 K/uL       Basophils Relative  0   0 - 1 %       Basophils Absolute  0.0   0.0 - 0.1 K/uL     COMPREHENSIVE METABOLIC PANEL     Status: Abnormal     Collection Time     12/12/12  4:18 PM       Component  Value  Range  Comment     Sodium  137   135 - 145 mEq/L       Potassium  4.8  3.5 - 5.1 mEq/L       Chloride  104   96 - 112 mEq/L       CO2  22   19 - 32 mEq/L       Glucose, Bld  182 (*)  70 - 99 mg/dL       BUN  15   6 - 23 mg/dL       Creatinine, Ser  1.27   0.50 - 1.35 mg/dL       Calcium  9.1   8.4 - 10.5 mg/dL       Total Protein  7.7   6.0 - 8.3 g/dL       Albumin  2.8 (*)  3.5 - 5.2 g/dL       AST  15   0 - 37 U/L       ALT  11   0 - 53 U/L       Alkaline Phosphatase  82   39 - 117 U/L       Total Bilirubin  2.0 (*)  0.3 - 1.2 mg/dL       GFR calc non Af Amer  52 (*)  >90 mL/min       GFR calc Af Amer  60 (*)  >90 mL/min     PROTIME-INR     Status: Abnormal     Collection Time     12/12/12  4:18 PM       Component  Value  Range  Comment      Prothrombin Time  19.4 (*)  11.6 - 15.2 seconds       INR  1.70 (*)  0.00 - 1.49     PRO B NATRIURETIC PEPTIDE     Status: Abnormal     Collection Time     12/12/12  4:18 PM       Component  Value  Range  Comment     Pro B Natriuretic peptide (BNP)  534.3 (*)  0 - 450 pg/mL     URINALYSIS, ROUTINE W REFLEX MICROSCOPIC     Status: Abnormal     Collection Time     12/12/12  7:10 PM       Component  Value  Range  Comment     Color, Urine  AMBER (*)  YELLOW  BIOCHEMICALS MAY BE AFFECTED BY COLOR     APPearance  CLOUDY (*)  CLEAR       Specific Gravity, Urine  1.020   1.005 - 1.030       pH  6.5   5.0 - 8.0       Glucose, UA  NEGATIVE   NEGATIVE mg/dL       Hgb urine dipstick  LARGE (*)  NEGATIVE       Bilirubin Urine  SMALL (*)  NEGATIVE       Ketones, ur  NEGATIVE   NEGATIVE mg/dL       Protein, ur  960 (*)  NEGATIVE mg/dL       Urobilinogen, UA  4.0 (*)  0.0 - 1.0 mg/dL       Nitrite  NEGATIVE   NEGATIVE       Leukocytes, UA  NEGATIVE   NEGATIVE     URINE MICROSCOPIC-ADD ON     Status: Abnormal     Collection Time     12/12/12  7:10 PM       Component  Value  Range  Comment  Squamous Epithelial / LPF  FEW (*)  RARE       RBC / HPF  21-50   <3 RBC/hpf       Bacteria, UA  FEW (*)  RARE     PROCALCITONIN     Status: Normal     Collection Time     12/12/12 11:59 PM       Component  Value  Range  Comment     Procalcitonin  0.39        D-DIMER, QUANTITATIVE     Status: Abnormal     Collection Time     12/12/12 11:59 PM       Component  Value  Range  Comment     D-Dimer, Quant  11.26 (*)  0.00 - 0.48 ug/mL-FEU     INFLUENZA PANEL BY PCR     Status: Normal     Collection Time     12/13/12 12:28 AM       Component  Value  Range  Comment     Influenza A By PCR  NEGATIVE   NEGATIVE       Influenza B By PCR  NEGATIVE   NEGATIVE       H1N1 flu by pcr  NOT DETECTED   NOT DETECTED     COMPREHENSIVE METABOLIC PANEL     Status: Abnormal     Collection Time     12/13/12  2:25 AM        Component  Value  Range  Comment     Sodium  132 (*)  135 - 145 mEq/L       Potassium  5.0   3.5 - 5.1 mEq/L       Chloride  100   96 - 112 mEq/L       CO2  21   19 - 32 mEq/L       Glucose, Bld  188 (*)  70 - 99 mg/dL       BUN  13   6 - 23 mg/dL       Creatinine, Ser  1.23   0.50 - 1.35 mg/dL       Calcium  8.7   8.4 - 10.5 mg/dL       Total Protein  7.2   6.0 - 8.3 g/dL       Albumin  2.6 (*)  3.5 - 5.2 g/dL       AST  15   0 - 37 U/L       ALT  11   0 - 53 U/L       Alkaline Phosphatase  74   39 - 117 U/L       Total Bilirubin  2.9 (*)  0.3 - 1.2 mg/dL       GFR calc non Af Amer  54 (*)  >90 mL/min       GFR calc Af Amer  62 (*)  >90 mL/min     PROTIME-INR     Status: Abnormal     Collection Time     12/13/12  2:25 AM       Component  Value  Range  Comment     Prothrombin Time  20.3 (*)  11.6 - 15.2 seconds       INR  1.81 (*)  0.00 - 1.49     CBC     Status: Abnormal     Collection Time     12/13/12  2:25 AM  Component  Value  Range  Comment     WBC  18.7 (*)  4.0 - 10.5 K/uL       RBC  3.13 (*)  4.22 - 5.81 MIL/uL       Hemoglobin  9.6 (*)  13.0 - 17.0 g/dL       HCT  84.1 (*)  32.4 - 52.0 %       MCV  89.1   78.0 - 100.0 fL       MCH  30.7   26.0 - 34.0 pg       MCHC  34.4   30.0 - 36.0 g/dL       RDW  40.1   02.7 - 15.5 %       Platelets  444 (*)  150 - 400 K/uL     LACTIC ACID, PLASMA     Status: Normal     Collection Time     12/13/12  2:25 AM       Component  Value  Range  Comment     Lactic Acid, Venous  1.2   0.5 - 2.2 mmol/L     PREALBUMIN     Status: Abnormal     Collection Time     12/13/12  2:25 AM       Component  Value  Range  Comment     Prealbumin  10.6 (*)  17.0 - 34.0 mg/dL     HEMOGLOBIN O5D     Status: Abnormal     Collection Time     12/13/12  2:25 AM       Component  Value  Range  Comment     Hemoglobin A1C  6.9 (*)  <5.7 %       Mean Plasma Glucose  151 (*)  <117 mg/dL     GLUCOSE, CAPILLARY     Status: Abnormal     Collection Time      12/13/12  8:02 AM       Component  Value  Range  Comment     Glucose-Capillary  176 (*)  70 - 99 mg/dL       Comment 1  Notify RN        GLUCOSE, CAPILLARY     Status: Abnormal     Collection Time     12/13/12 12:03 PM       Component  Value  Range  Comment     Glucose-Capillary  150 (*)  70 - 99 mg/dL       Comment 1  Notify RN          Dg Chest 2 View  12/12/2012  *RADIOLOGY REPORT*  Clinical Data: Fever, weakness  CHEST - 2 VIEW  Comparison: 12/01/2012  Findings: Enlargement of cardiac silhouette. Tortuous aorta. Pulmonary vascularity normal. Chronic bibasilar atelectasis versus scarring. Remaining lungs clear. No pleural effusion or pneumothorax. Scattered endplate spur formation thoracic spine.  IMPRESSION: Enlargement of cardiac silhouette. Chronic bibasilar atelectasis versus scarring.   Original Report Authenticated By: Ulyses Southward, M.D.    Ct Head Wo Contrast  12/12/2012  *RADIOLOGY REPORT*  Clinical Data: Weakness, dementia.  CT HEAD WITHOUT CONTRAST  Technique:  Contiguous axial images were obtained from the base of the skull through the vertex without contrast.  Comparison: 06/21/2011  Findings: Diffuse parenchymal atrophy. Patchy areas of hypoattenuation in deep and periventricular white matter bilaterally. Negative for acute intracranial hemorrhage, mass lesion, acute infarction, midline shift, or mass-effect. Acute infarct may be inapparent  on noncontrast CT. Ventricles and sulci symmetric. Bone windows demonstrate no focal lesion.  IMPRESSION:  1. Negative for bleed or other acute intracranial process.  2. Atrophy and nonspecific white matter changes   Original Report Authenticated By: D. Andria Rhein, MD    Ct Abdomen Pelvis W Contrast  12/13/2012  *RADIOLOGY REPORT*  Clinical Data: Fever.  Weakness.  Abdominal distension.  Dysuria. Urinary frequency.  White cell count 13.4.  CT ABDOMEN AND PELVIS WITH CONTRAST  Technique:  Multidetector CT imaging of the abdomen and pelvis was  performed following the standard protocol during bolus administration of intravenous contrast.  Contrast: OMNIPAQUE IOHEXOL 300 MG/ML  SOLN  Comparison: None.  Findings: There is infiltration and / or atelectasis in both lung bases.  Small esophageal hiatal hernia.  The gallbladder is distended with thickened edematous wall and infiltration in the pericholecystic fat.  No opaque stones are demonstrated.  Changes are consistent with acalculous cholecystitis in the appropriate clinical setting.  The liver, spleen, pancreas, adrenal glands, and retroperitoneal lymph nodes are unremarkable. The kidneys are mildly atrophic.  Left renal cysts.  No hydronephrosis or solid mass identified.  Calcification of the aorta without aneurysm.  The stomach, small bowel, and colon are not abnormally distended.  Infiltration in the fat of the right pericolic gutter extending down from the gallbladder.  No free fluid or free air in the abdomen.  Pelvis:  There is a hematoma in the inferior left rectus abdominous muscle extending to the suprapubic region and right inferior rectus sheath.  The hematoma measures up to about 290.8 x 10.2 x 9 cm. The prostate gland is diffusely enlarged, measuring 6.6 x 5.7 cm. The bladder is decompressed with suggestion of bladder wall thickening.  Calcification in the seminal vesicles.  Diverticular changes in the sigmoid colon without diverticulitis.  The appendix is normal.  No significant pelvic lymphadenopathy.  Degenerative changes in the lumbar spine.  IMPRESSION: Gallbladder distension and inflammatory changes suggesting cholecystitis although no opaque stones are demonstrated.  Large inferior rectus sheath hematoma.  Diffuse prostate enlargement. Bladder wall thickening.   Original Report Authenticated By: Burman Nieves, M.D.      Review of Systems  Constitutional: Positive for fever and malaise/fatigue. Negative for weight loss and diaphoresis.        ROS thru wife and son, pt barely  spoke thru entire interview. He has dementia worse and more profound since his knee surgery almost 2 years ago.  Functions normally at home.  They take him to church, but not to store, they keep him at home.  He needs a walker to get around the home.  He has very short term memory and fair long term memory.    HENT: Negative.   Eyes:        Wears glasses  Respiratory: Positive for wheezing. Negative for cough, hemoptysis, sputum production and shortness of breath (occasional end of day).   Cardiovascular: Positive for orthopnea and leg swelling (Its actually better now than usual per son and wife.).        No orthopnea, but he sleeps at home in a lift chair because of his back and knee replacement.  Just easier to get up and down.  Gastrointestinal: Positive for abdominal pain (started complaining in the ER less than 24 hours ago.) and diarrhea (last week with GI bleed). Negative for heartburn, nausea, vomiting, constipation, blood in stool and melena.  Genitourinary:        Urine output down per family  last 24 hours  Musculoskeletal: Negative.   Skin: Positive for rash (right hip, it's better now.).  Neurological: Negative.  Negative for dizziness, tingling, tremors, sensory change, speech change, focal weakness, seizures and loss of consciousness.        He's speaking very little, knows it's the first month, doesn't know the year. Knows family.  Endo/Heme/Allergies: Negative.   Psychiatric/Behavioral: Negative.    Blood pressure 140/70, pulse 108, temperature 99.9 F (37.7 C), temperature source Oral, resp. rate 25, height 6\' 2"  (1.88 m), weight 240 lb (108.863 kg), SpO2 93.00%. Physical Exam  Constitutional: He appears well-developed and well-nourished. No distress (really painful right buttocks).      BMI 38 72 inch 280 lbs BP 70/40 after 3 liters of saline up to SBP 90.  HENT:   Head: Normocephalic and atraumatic.   Nose: Nose normal.  Eyes: Conjunctivae normal and EOM are normal.  Pupils are equal, round, and reactive to light. Right eye exhibits no discharge. Left eye exhibits discharge. No scleral icterus.  Neck: Normal range of motion. Neck supple. No JVD present. No tracheal deviation present. No thyromegaly present.  Cardiovascular: Regular rhythm.        Tachycardic HR in the 110-120 range  Respiratory: Effort normal and breath sounds normal. No respiratory distress. He has no wheezes. He has no rales. He exhibits no tenderness.       Down some in the bases  GI: Soft. Bowel sounds are normal. He exhibits no distension and no mass. There is no tenderness. There is no rebound and no guarding.       He's not really tender on exam now, but says abdomen hurts.  He has ecchymosis on the left where his seat belt was "to tight."  Musculoskeletal: Normal range of motion. He exhibits edema (trace). He exhibits no tenderness.  Lymphadenopathy:   He has no cervical adenopathy.  Neurological: He is alert. No cranial nerve deficit.  Skin: Skin is warm and dry. No rash noted. He is not diaphoretic. No erythema. No pallor.  Psychiatric: His behavior is normal.       He's very quiet, will answer questions ask to him, but is content to have family answer question for him.  Knows family, but seems a bit distant.  Clearly feels bad.  Family says he's normally very talkative.    Assessment/Plan: 1. Abdominal pain with probable a calculus cholecystitis, possible sepsis. 2. Large left rectus sheath hematoma 3. Recent GI bleed with transfusion.  (Hospitalize 1/10 through 12/10/12) 4. History of pulmonary embolus/DVT on chronic Coumadin therapy. 5. Dementia 6. Type 2 diabetes mellitus 7. Hypertension 8. Dyslipidemia 9. Osteoarthritis 9. History of diverticulitis. 10. PCM with a pre-albumen of 10.6. 11. Deconditioning walks with a walker for the last 2 years.  Plan: Currently the patient is somewhat confused, his white count is rising. Urine output is down per the family. He is on  appropriate antibiotics. At this point he's not a good surgical candidate, but he may be a candidate for percutaneous drainage of his gallbladder. Dr. Michaell Cowing will look at the studies and make recommendations shortly.  [facility] physician assistant for Dr. Karie Soda  JENNINGS,WILLARD 12/13/2012, 1:02 PM       Assessment:  Agree with all above. Discussed with patien't's spouse and son in detail regarding percutaneous cholecystostomy drain placement. Benefits and potential risks including but not limited to infection, bleeding, organ damage, malfunctioning drain, complications with moderate sedation and death. All questions answered to their satisfaction.  Plan for drainage procedure later this pm.

## 2012-12-13 NOTE — Consult Note (Signed)
Demented elderly patient on chronic anticoagulation & recent GI bleed & rectus sheath hematoma. Now with sepsis, inc WBC/procalc/DDimer.  +Murphy's.  No other evidence of infection Rec IV ABx Agree with ICU stay  Standard of care is lap chole, but given recent rectus hematoma, sepsis, dementia, and deconditioning, pt at much higher risk for surgery. I think percutaneous Chole drain less risky & can help control the cholecystitis.  D/w IR staff but awaiting feedback from IR MD, Dr. Grace Isaac.  Family wish to try perc chole 1st & reserve OR as last option.

## 2012-12-13 NOTE — Progress Notes (Signed)
[facility]             Progress Note   Subjective: i feel bad, unable to specify  Objective: Vital signs in last 24 hours: Temp:  [98.7 F (37.1 C)-102.3 F (39.1 C)] 100.6 F (38.1 C) (01/15 1116) Pulse Rate:  [84-103] 98  (01/15 1116) Resp:  [18-23] 22  (01/15 1116) BP: (127-157)/(64-83) 128/75 mmHg (01/15 1116) SpO2:  [93 %-98 %] 93 % (01/15 1116) Weight:  [108.863 kg (240 lb)] 108.863 kg (240 lb) (01/15 0232) Weight change:     Intake/Output from previous day:       Physical Exam: General: lethargic, arousible, oriented to self only, in no acute distress. HEENT: No bruits, no goiter. Heart: Regular rate and rhythm, without murmurs, rubs, gallops. Lungs: Clear to auscultation bilaterally. Abdomen: Soft, distended, RUQ tenderness, no rigidity/rebound, echymoses in L lateral abd wall, decreased bowel sounds. Extremities: No clubbing cyanosis or edema with positive pedal pulses. Neuro: Grossly intact, nonfocal.    Lab Results: Basic Metabolic Panel:  Basename 12/13/12 0225 12/12/12 1618  NA 132* 137  K 5.0 4.8  CL 100 104  CO2 21 22  GLUCOSE 188* 182*  BUN 13 15  CREATININE 1.23 1.27  CALCIUM 8.7 9.1  MG -- --  PHOS -- --   Liver Function Tests:  Basename 12/13/12 0225 12/12/12 1618  AST 15 15  ALT 11 11  ALKPHOS 74 82  BILITOT 2.9* 2.0*  PROT 7.2 7.7  ALBUMIN 2.6* 2.8*   No results found for this basename: LIPASE:2,AMYLASE:2 in the last 72 hours No results found for this basename: AMMONIA:2 in the last 72 hours CBC:  Basename 12/13/12 0225 12/12/12 1618  WBC 18.7* 13.4*  NEUTROABS -- 11.0*  HGB 9.6* 10.1*  HCT 27.9* 30.5*  MCV 89.1 89.4  PLT 444* 511*   Cardiac Enzymes: No results found for this basename: CKTOTAL:3,CKMB:3,CKMBINDEX:3,TROPONINI:3 in the last 72 hours BNP:  [facility] 12/12/12 1618  PROBNP 534.3*   D-Dimer:  Alvira Philips 12/12/12 2359  DDIMER 11.26*   CBG:  Basename 12/13/12 0802 12/12/12 1547 12/10/12  1235  GLUCAP 176* 168* 98   Hemoglobin A1C: No results found for this basename: HGBA1C in the last 72 hours Fasting Lipid Panel: No results found for this basename: CHOL,HDL,LDLCALC,TRIG,CHOLHDL,LDLDIRECT in the last 72 hours Thyroid Function Tests: No results found for this basename: TSH,T4TOTAL,FREET4,T3FREE,THYROIDAB in the last 72 hours Anemia Panel: No results found for this basename: VITAMINB12,FOLATE,FERRITIN,TIBC,IRON,RETICCTPCT in the last 72 hours Coagulation:  Basename 12/13/12 0225 12/12/12 1618  LABPROT 20.3* 19.4*  INR 1.81* 1.70*   Urine Drug Screen: Drugs of Abuse  No results found for this basename: labopia,  cocainscrnur,  labbenz,  amphetmu,  thcu,  labbarb    Alcohol Level: No results found for this basename: ETH:2 in the last 72 hours Urinalysis:  Basename 12/12/12 1910  COLORURINE AMBER*  LABSPEC 1.020  PHURINE 6.5  GLUCOSEU NEGATIVE  HGBUR LARGE*  BILIRUBINUR SMALL*  KETONESUR NEGATIVE  PROTEINUR 100*  UROBILINOGEN 4.0*  NITRITE NEGATIVE  LEUKOCYTESUR NEGATIVE    No results found for this or any previous visit (from the past 240 hour(s)).  Studies/Results: Dg Chest 2 View  12/12/2012  *RADIOLOGY REPORT*  Clinical Data: Fever, weakness  CHEST - 2 VIEW  Comparison: 12/01/2012  Findings: Enlargement of cardiac silhouette. Tortuous aorta. Pulmonary vascularity normal. Chronic bibasilar atelectasis versus scarring. Remaining lungs clear. No pleural effusion or pneumothorax. Scattered endplate spur formation thoracic spine.  IMPRESSION: Enlargement of cardiac silhouette. Chronic bibasilar  atelectasis versus scarring.   Original Report Authenticated By: Ulyses Southward, M.D.    Ct Head Wo Contrast  12/12/2012  *RADIOLOGY REPORT*  Clinical Data: Weakness, dementia.  CT HEAD WITHOUT CONTRAST  Technique:  Contiguous axial images were obtained from the base of the skull through the vertex without contrast.  Comparison: 06/21/2011  Findings: Diffuse parenchymal  atrophy. Patchy areas of hypoattenuation in deep and periventricular white matter bilaterally. Negative for acute intracranial hemorrhage, mass lesion, acute infarction, midline shift, or mass-effect. Acute infarct may be inapparent on noncontrast CT. Ventricles and sulci symmetric. Bone windows demonstrate no focal lesion.  IMPRESSION:  1. Negative for bleed or other acute intracranial process.  2. Atrophy and nonspecific white matter changes   Original Report Authenticated By: D. Andria Rhein, MD    Ct Abdomen Pelvis W Contrast  12/13/2012  *RADIOLOGY REPORT*  Clinical Data: Fever.  Weakness.  Abdominal distension.  Dysuria. Urinary frequency.  White cell count 13.4.  CT ABDOMEN AND PELVIS WITH CONTRAST  Technique:  Multidetector CT imaging of the abdomen and pelvis was performed following the standard protocol during bolus administration of intravenous contrast.  Contrast: OMNIPAQUE IOHEXOL 300 MG/ML  SOLN  Comparison: None.  Findings: There is infiltration and / or atelectasis in both lung bases.  Small esophageal hiatal hernia.  The gallbladder is distended with thickened edematous wall and infiltration in the pericholecystic fat.  No opaque stones are demonstrated.  Changes are consistent with acalculous cholecystitis in the appropriate clinical setting.  The liver, spleen, pancreas, adrenal glands, and retroperitoneal lymph nodes are unremarkable. The kidneys are mildly atrophic.  Left renal cysts.  No hydronephrosis or solid mass identified.  Calcification of the aorta without aneurysm.  The stomach, small bowel, and colon are not abnormally distended.  Infiltration in the fat of the right pericolic gutter extending down from the gallbladder.  No free fluid or free air in the abdomen.  Pelvis:  There is a hematoma in the inferior left rectus abdominous muscle extending to the suprapubic region and right inferior rectus sheath.  The hematoma measures up to about 290.8 x 10.2 x 9 cm. The prostate gland  is diffusely enlarged, measuring 6.6 x 5.7 cm. The bladder is decompressed with suggestion of bladder wall thickening.  Calcification in the seminal vesicles.  Diverticular changes in the sigmoid colon without diverticulitis.  The appendix is normal.  No significant pelvic lymphadenopathy.  Degenerative changes in the lumbar spine.  IMPRESSION: Gallbladder distension and inflammatory changes suggesting cholecystitis although no opaque stones are demonstrated.  Large inferior rectus sheath hematoma.  Diffuse prostate enlargement. Bladder wall thickening.   Original Report Authenticated By: Burman Nieves, M.D.     Medications: Scheduled Meds:    . donepezil  10 mg Oral QHS  . insulin aspart  0-9 Units Subcutaneous TID WC  . Tamsulosin HCl  0.4 mg Oral Daily  . traMADol  50 mg Oral BID   Continuous Infusions:    . sodium chloride Stopped (12/13/12 0009)  . sodium chloride 75 mL/hr at 12/13/12 0010  . phytonadione (VITAMIN K) IV    . piperacillin-tazobactam (ZOSYN)  IV 3.375 g (12/13/12 0525)  . vancomycin     PRN Meds:.acetaminophen, acetaminophen, acetaminophen, albuterol  Assessment/Plan:  1. Sepsis: likely due to 2,  empiric abx, Vanc/Zosyn Fu blood and urine CX  2. Fever, CT concerning for cholecystitis, Bili elevated, RUQ tender Will Keep NPO Empiric  IV Vanc/Zosyn, IVF Consult gen surgery  3. Large L rectus sheath  hematoma Unclear when this developed, wife recalls some trauma to abd with a safety belt tugging on his abd over a week ago when he was on coumadin, his INR is still supratherapeutic , but his coumadin is still on hold due to recent lower GI bleed and admission last week. Will give vitamin K x1 I now Hb stable Monitor CBC closely, type and screen INR in am  4. H/o DVT/PE in 05/2011 Anticoagulation on hold since lower GI bleed last week, continue to hold this Will need to consider IVC filter at some point  5. Dementia: stable  6. BPH continue flomax,  monitor hematuria  Code status: family unable to make a decision, have asked for some time to discuss this, they think they want CPR but no intubation Family communication: discussed with son and wife at bedside     Time spent coordinating care:   LOS: 1 day   [facility] Pager: [phone] 12/13/2012, 11:54 AM

## 2012-12-14 DIAGNOSIS — I2699 Other pulmonary embolism without acute cor pulmonale: Secondary | ICD-10-CM

## 2012-12-14 DIAGNOSIS — M7981 Nontraumatic hematoma of soft tissue: Secondary | ICD-10-CM

## 2012-12-14 DIAGNOSIS — E119 Type 2 diabetes mellitus without complications: Secondary | ICD-10-CM

## 2012-12-14 LAB — CBC
HCT: 27.5 % — ABNORMAL LOW (ref 39.0–52.0)
HCT: 26.8 % — ABNORMAL LOW (ref 39.0–52.0)
Hemoglobin: 9.4 g/dL — ABNORMAL LOW (ref 13.0–17.0)
Hemoglobin: 9.1 g/dL — ABNORMAL LOW (ref 13.0–17.0)
MCH: 30.1 pg (ref 26.0–34.0)
MCHC: 34 g/dL (ref 30.0–36.0)
MCV: 88.7 fL (ref 78.0–100.0)
MCV: 88.7 fL (ref 78.0–100.0)
Platelets: 382 10*3/uL (ref 150–400)
RBC: 3.1 MIL/uL — ABNORMAL LOW (ref 4.22–5.81)
RBC: 3.02 MIL/uL — ABNORMAL LOW (ref 4.22–5.81)
RDW: 13.9 % (ref 11.5–15.5)
WBC: 17.2 10*3/uL — ABNORMAL HIGH (ref 4.0–10.5)
WBC: 17.5 10*3/uL — ABNORMAL HIGH (ref 4.0–10.5)

## 2012-12-14 LAB — BASIC METABOLIC PANEL
Chloride: 104 mEq/L (ref 96–112)
GFR calc Af Amer: 43 mL/min — ABNORMAL LOW (ref >90)
GFR calc non Af Amer: 37 mL/min — ABNORMAL LOW (ref >90)
Potassium: 4.8 mEq/L (ref 3.5–5.1)
Sodium: 135 mEq/L (ref 135–145)

## 2012-12-14 LAB — CK TOTAL AND CKMB (NOT AT ARMC): CK, MB: 3 ng/mL (ref 0.3–4.0)

## 2012-12-14 LAB — GLUCOSE, CAPILLARY
Glucose-Capillary: 117 mg/dL — ABNORMAL HIGH (ref 70–99)
Glucose-Capillary: 94 mg/dL (ref 70–99)

## 2012-12-14 LAB — COMPREHENSIVE METABOLIC PANEL
ALT: 9 U/L (ref 0–53)
Alkaline Phosphatase: 65 U/L (ref 39–117)
BUN: 20 mg/dL (ref 6–23)
CO2: 19 mEq/L (ref 19–32)
Chloride: 103 mEq/L (ref 96–112)
GFR calc Af Amer: 48 mL/min — ABNORMAL LOW (ref >90)
Glucose, Bld: 152 mg/dL — ABNORMAL HIGH (ref 70–99)
Potassium: 4.5 mEq/L (ref 3.5–5.1)
Total Bilirubin: 1.8 mg/dL — ABNORMAL HIGH (ref 0.3–1.2)

## 2012-12-14 LAB — TROPONIN I: Troponin I: <0.3 ng/mL (ref <0.30)

## 2012-12-14 LAB — URINE CULTURE: Special Requests: NORMAL

## 2012-12-14 LAB — PRO B NATRIURETIC PEPTIDE: Pro B Natriuretic peptide (BNP): 1483 pg/mL — ABNORMAL HIGH (ref 0–450)

## 2012-12-14 LAB — PHOSPHORUS: Phosphorus: 4.5 mg/dL (ref 2.3–4.6)

## 2012-12-14 LAB — PROTIME-INR: Prothrombin Time: 18.4 seconds — ABNORMAL HIGH (ref 11.6–15.2)

## 2012-12-14 MED ORDER — CHLORHEXIDINE GLUCONATE 0.12 % MT SOLN
15.0000 mL | Freq: Two times a day (BID) | OROMUCOSAL | Status: DC
  Administered 2012-12-15 – 2012-12-19 (×8): 15 mL via OROMUCOSAL
  Filled 2012-12-14 (×11): qty 15

## 2012-12-14 MED ORDER — METOPROLOL TARTRATE 1 MG/ML IV SOLN
INTRAVENOUS | Status: AC
  Filled 2012-12-14: qty 5

## 2012-12-14 MED ORDER — SODIUM CHLORIDE 0.9 % IV BOLUS (SEPSIS)
500.0000 mL | Freq: Once | INTRAVENOUS | Status: AC
  Administered 2012-12-14: 500 mL via INTRAVENOUS

## 2012-12-14 MED ORDER — BIOTENE DRY MOUTH MT LIQD
15.0000 mL | Freq: Two times a day (BID) | OROMUCOSAL | Status: DC
  Administered 2012-12-15 – 2012-12-19 (×5): 15 mL via OROMUCOSAL

## 2012-12-14 MED ORDER — METOPROLOL TARTRATE 1 MG/ML IV SOLN: 3.0000 mg | INTRAVENOUS | Status: AC

## 2012-12-14 NOTE — Progress Notes (Signed)
ANTIBIOTIC CONSULT NOTE - FOLLOW UP  Pharmacy Consult for Vancomycin/Zosyn Indication: pneumonia  No Known Allergies  Patient Measurements: Height: 6\' 2"  (188 cm) Weight: 240 lb (108.863 kg) IBW/kg (Calculated) : 82.2    Vital Signs: Temp: 98.2 F (36.8 C) (01/16 1500) Temp src: Oral (01/16 1500) BP: 110/69 mmHg (01/16 1800) Pulse Rate: 93  (01/16 1800) Intake/Output from previous day: 01/15 0701 - 01/16 0700 In: 2075 [I.V.:1425; IV Piggyback:650] Out: 550 [Urine:200; Drains:350] Intake/Output from this shift:    Labs:  Basename 12/14/12 0415 12/14/12 0325 12/13/12 1952 12/13/12 0225  WBC 17.5* 17.2* 22.8* --  HGB 9.1* 9.4* 10.0* --  PLT 382 376 413* --  LABCREA -- -- -- --  CREATININE 1.66* 1.52* -- 1.23   Estimated Creatinine Clearance: 46.6 ml/min (by C-G formula based on Cr of 1.66).  Basename 12/14/12 2130  VANCOTROUGH --  VANCOPEAK --  VANCORANDOM 11.9  GENTTROUGH --  GENTPEAK --  GENTRANDOM --  TOBRATROUGH --  TOBRAPEAK --  TOBRARND --  AMIKACINPEAK --  AMIKACINTROU --  AMIKACIN --     Microbiology: Recent Results (from the past 720 hour(s))  URINE CULTURE     Status: Normal   Collection Time   12/01/12  8:39 PM      Component Value Range Status Comment   Specimen Description URINE, CATHETERIZED   Final    Special Requests NONE   Final    Culture  Setup Time 12/02/2012 02:29   Final    Colony Count NO GROWTH   Final    Culture NO GROWTH   Final    Report Status 12/03/2012 FINAL   Final   CULTURE, BLOOD (ROUTINE X 2)     Status: Normal   Collection Time   12/01/12  8:42 PM      Component Value Range Status Comment   Specimen Description BLOOD RIGHT ANTECUBITAL   Final    Special Requests BOTTLES DRAWN AEROBIC AND ANAEROBIC 5 CC EACH   Final    Culture  Setup Time 12/02/2012 02:09   Final    Culture NO GROWTH 5 DAYS   Final    Report Status 12/08/2012 FINAL   Final   CULTURE, BLOOD (ROUTINE X 2)     Status: Normal   Collection Time   12/01/12   9:05 PM      Component Value Range Status Comment   Specimen Description BLOOD LEFT ARM   Final    Special Requests BOTTLES DRAWN AEROBIC AND ANAEROBIC   Final    Culture  Setup Time 12/02/2012 02:09   Final    Culture NO GROWTH 5 DAYS   Final    Report Status 12/08/2012 FINAL   Final   URINE CULTURE     Status: Normal   Collection Time   12/12/12  7:10 PM      Component Value Range Status Comment   Specimen Description URINE, RANDOM   Final    Special Requests Normal   Final    Culture  Setup Time 12/13/2012 06:31   Final    Colony Count NO GROWTH   Final    Culture NO GROWTH   Final    Report Status 12/14/2012 FINAL   Final   CULTURE, BLOOD (ROUTINE X 2)     Status: Normal (Preliminary result)   Collection Time   12/12/12 10:37 PM      Component Value Range Status Comment   Specimen Description BLOOD LEFT HAND   Final  Special Requests BOTTLES DRAWN AEROBIC AND ANAEROBIC 5CC   Final    Culture  Setup Time 12/13/2012 06:16   Final    Culture     Final    Value:        BLOOD CULTURE RECEIVED NO GROWTH TO DATE CULTURE WILL BE HELD FOR 5 DAYS BEFORE ISSUING A FINAL NEGATIVE REPORT   Report Status PENDING   Incomplete   CULTURE, BLOOD (ROUTINE X 2)     Status: Normal (Preliminary result)   Collection Time   12/12/12 10:43 PM      Component Value Range Status Comment   Specimen Description BLOOD RIGHT ANTECUBITAL   Final    Special Requests BOTTLES DRAWN AEROBIC AND ANAEROBIC 5CC   Final    Culture  Setup Time 12/13/2012 06:16   Final    Culture     Final    Value:        BLOOD CULTURE RECEIVED NO GROWTH TO DATE CULTURE WILL BE HELD FOR 5 DAYS BEFORE ISSUING A FINAL NEGATIVE REPORT   Report Status PENDING   Incomplete   MRSA PCR SCREENING     Status: Normal   Collection Time   12/13/12  1:29 PM      Component Value Range Status Comment   MRSA by PCR NEGATIVE  NEGATIVE Final   CULTURE, ROUTINE-ABSCESS     Status: Normal (Preliminary result)   Collection Time   12/13/12  5:12  PM      Component Value Range Status Comment   Specimen Description DRAINAGE   Final    Special Requests Normal   Final    Gram Stain     Final    Value: NO WBC SEEN     NO ORGANISMS SEEN   Culture NO GROWTH   Final    Report Status PENDING   Incomplete     Anti-infectives     Start     Dose/Rate Route Frequency Ordered Stop   12/13/12 2200   vancomycin (VANCOCIN) 1,500 mg in sodium chloride 0.9 % 500 mL IVPB        1,500 mg 250 mL/hr over 120 Minutes Intravenous Daily at bedtime 12/13/12 0245     12/13/12 0600  piperacillin-tazobactam (ZOSYN) IVPB 3.375 g       3.375 g 12.5 mL/hr over 240 Minutes Intravenous 3 times per day 12/13/12 0245     12/13/12 0000  piperacillin-tazobactam (ZOSYN) IVPB 3.375 g       3.375 g 100 mL/hr over 30 Minutes Intravenous  Once 12/12/12 2346 12/13/12 0100   12/13/12 0000   vancomycin (VANCOCIN) 1,500 mg in sodium chloride 0.9 % 500 mL IVPB        1,500 mg 250 mL/hr over 120 Minutes Intravenous  Once 12/12/12 2346 12/13/12 0615   12/12/12 2215   cefTRIAXone (ROCEPHIN) 1 g in dextrose 5 % 50 mL IVPB        1 g 100 mL/hr over 30 Minutes Intravenous  Once 12/12/12 2211 12/13/12 0008          Assessment: 82 YOM on D#2 Vancomycin 1500mg  IV q24h/ Zosyn 3.375g IV q8h (each over 4 hours) Scr 1.66 < 1.23, CrCl 60ml/min (35 ml/min/1.8m2) Random vanc level this evening 11.9  Goal of Therapy:  Vancomycin trough level 15-20 mcg/ml  Plan:  Continue current vanc and zosyn doses F/U AM Scr Recheck random vanc level if necessary otherwise VT at steady state  Gwen Her PharmD  [phone] 12/14/2012 10:22 PM

## 2012-12-14 NOTE — Progress Notes (Signed)
I have seen and examined the patient and agree with the assessment and plans.  Improving   Daemien Fronczak A. Magnus Ivan  MD, FACS

## 2012-12-14 NOTE — Progress Notes (Addendum)
[facility]             Progress Note   Subjective: Feel ok today, transient SVT earlier resolved, poor UO  Objective: Vital signs in last 24 hours: Temp:  [98.7 F (37.1 C)-101.3 F (38.5 C)] 99.2 F (37.3 C) (01/16 0400) Pulse Rate:  [84-153] 93  (01/16 0400) Resp:  [8-35] 16  (01/16 0400) BP: (74-162)/(41-91) 90/49 mmHg (01/16 0400) SpO2:  [92 %-100 %] 94 % (01/16 0400) Weight change:     Intake/Output from previous day: 01/15 0701 - 01/16 0700 In: 1950 [I.V.:1350; IV Piggyback:600] Out: 550 [Urine:200; Drains:350]     Physical Exam: General: awake, alert, oriented to place and partly to time, in no acute distress. HEENT: No bruits, no goiter. Heart: Regular rate and rhythm, without murmurs, rubs, gallops. Lungs: Clear to auscultation bilaterally. Abdomen: Soft, distended, RUQ less tender, no rigidity/rebound, echymoses in L lateral abd wall, decreased bowel sounds. Extremities: No clubbing cyanosis or edema with positive pedal pulses. Neuro: Grossly intact, nonfocal.    Lab Results: Basic Metabolic Panel:  Basename 12/14/12 0415 12/14/12 0325  NA 135 134*  K 4.8 4.5  CL 104 103  CO2 21 19  GLUCOSE 152* 152*  BUN 21 20  CREATININE 1.66* 1.52*  CALCIUM 8.3* 8.1*  MG 1.9 --  PHOS 4.5 --   Liver Function Tests:  [facility] 12/14/12 0325 12/13/12 0225  AST 11 15  ALT 9 11  ALKPHOS 65 74  BILITOT 1.8* 2.9*  PROT 6.1 7.2  ALBUMIN 2.1* 2.6*   No results found for this basename: LIPASE:2,AMYLASE:2 in the last 72 hours No results found for this basename: AMMONIA:2 in the last 72 hours CBC:  Basename 12/14/12 0415 12/14/12 0325 12/12/12 1618  WBC 17.5* 17.2* --  NEUTROABS -- -- 11.0*  HGB 9.1* 9.4* --  HCT 26.8* 27.5* --  MCV 88.7 88.7 --  PLT 382 376 --   Cardiac Enzymes:  Basename 12/14/12 0415  CKTOTAL 78  CKMB 3.0  CKMBINDEX --  TROPONINI <0.30   BNP:  Basename 12/14/12 0414 12/12/12 1618  PROBNP 1483.0* 534.3*    D-Dimer:  Basename 12/12/12 2359  DDIMER 11.26*   CBG:  Basename 12/13/12 2231 12/13/12 1848 12/13/12 1203 12/13/12 0802 12/12/12 1547  GLUCAP 168* 144* 150* 176* 168*   Hemoglobin A1C:  Basename 12/13/12 0225  HGBA1C 6.9*   Fasting Lipid Panel: No results found for this basename: CHOL,HDL,LDLCALC,TRIG,CHOLHDL,LDLDIRECT in the last 72 hours Thyroid Function Tests: No results found for this basename: TSH,T4TOTAL,FREET4,T3FREE,THYROIDAB in the last 72 hours Anemia Panel: No results found for this basename: VITAMINB12,FOLATE,FERRITIN,TIBC,IRON,RETICCTPCT in the last 72 hours Coagulation:  Basename 12/14/12 0325 12/13/12 0225  LABPROT 18.4* 20.3*  INR 1.58* 1.81*   Urine Drug Screen: Drugs of Abuse  No results found for this basename: labopia,  cocainscrnur,  labbenz,  amphetmu,  thcu,  labbarb    Alcohol Level: No results found for this basename: ETH:2 in the last 72 hours Urinalysis:  Basename 12/12/12 1910  COLORURINE AMBER*  LABSPEC 1.020  PHURINE 6.5  GLUCOSEU NEGATIVE  HGBUR LARGE*  BILIRUBINUR SMALL*  KETONESUR NEGATIVE  PROTEINUR 100*  UROBILINOGEN 4.0*  NITRITE NEGATIVE  LEUKOCYTESUR NEGATIVE    Recent Results (from the past 240 hour(s))  URINE CULTURE     Status: Normal   Collection Time   12/12/12  7:10 PM      Component Value Range Status Comment   Specimen Description URINE, RANDOM   Final    Special  Requests Normal   Final    Culture  Setup Time 12/13/2012 06:31   Final    Colony Count NO GROWTH   Final    Culture NO GROWTH   Final    Report Status 12/14/2012 FINAL   Final   MRSA PCR SCREENING     Status: Normal   Collection Time   12/13/12  1:29 PM      Component Value Range Status Comment   MRSA by PCR NEGATIVE  NEGATIVE Final     Studies/Results: Dg Chest 2 View  12/12/2012  *RADIOLOGY REPORT*  Clinical Data: Fever, weakness  CHEST - 2 VIEW  Comparison: 12/01/2012  Findings: Enlargement of cardiac silhouette. Tortuous aorta.  Pulmonary vascularity normal. Chronic bibasilar atelectasis versus scarring. Remaining lungs clear. No pleural effusion or pneumothorax. Scattered endplate spur formation thoracic spine.  IMPRESSION: Enlargement of cardiac silhouette. Chronic bibasilar atelectasis versus scarring.   Original Report Authenticated By: Ulyses Southward, M.D.    Ct Head Wo Contrast  12/12/2012  *RADIOLOGY REPORT*  Clinical Data: Weakness, dementia.  CT HEAD WITHOUT CONTRAST  Technique:  Contiguous axial images were obtained from the base of the skull through the vertex without contrast.  Comparison: 06/21/2011  Findings: Diffuse parenchymal atrophy. Patchy areas of hypoattenuation in deep and periventricular white matter bilaterally. Negative for acute intracranial hemorrhage, mass lesion, acute infarction, midline shift, or mass-effect. Acute infarct may be inapparent on noncontrast CT. Ventricles and sulci symmetric. Bone windows demonstrate no focal lesion.  IMPRESSION:  1. Negative for bleed or other acute intracranial process.  2. Atrophy and nonspecific white matter changes   Original Report Authenticated By: D. Andria Rhein, MD    Ct Abdomen Pelvis W Contrast  12/13/2012  *RADIOLOGY REPORT*  Clinical Data: Fever.  Weakness.  Abdominal distension.  Dysuria. Urinary frequency.  White cell count 13.4.  CT ABDOMEN AND PELVIS WITH CONTRAST  Technique:  Multidetector CT imaging of the abdomen and pelvis was performed following the standard protocol during bolus administration of intravenous contrast.  Contrast: OMNIPAQUE IOHEXOL 300 MG/ML  SOLN  Comparison: None.  Findings: There is infiltration and / or atelectasis in both lung bases.  Small esophageal hiatal hernia.  The gallbladder is distended with thickened edematous wall and infiltration in the pericholecystic fat.  No opaque stones are demonstrated.  Changes are consistent with acalculous cholecystitis in the appropriate clinical setting.  The liver, spleen, pancreas,  adrenal glands, and retroperitoneal lymph nodes are unremarkable. The kidneys are mildly atrophic.  Left renal cysts.  No hydronephrosis or solid mass identified.  Calcification of the aorta without aneurysm.  The stomach, small bowel, and colon are not abnormally distended.  Infiltration in the fat of the right pericolic gutter extending down from the gallbladder.  No free fluid or free air in the abdomen.  Pelvis:  There is a hematoma in the inferior left rectus abdominous muscle extending to the suprapubic region and right inferior rectus sheath.  The hematoma measures up to about 290.8 x 10.2 x 9 cm. The prostate gland is diffusely enlarged, measuring 6.6 x 5.7 cm. The bladder is decompressed with suggestion of bladder wall thickening.  Calcification in the seminal vesicles.  Diverticular changes in the sigmoid colon without diverticulitis.  The appendix is normal.  No significant pelvic lymphadenopathy.  Degenerative changes in the lumbar spine.  IMPRESSION: Gallbladder distension and inflammatory changes suggesting cholecystitis although no opaque stones are demonstrated.  Large inferior rectus sheath hematoma.  Diffuse prostate enlargement. Bladder wall thickening.  Original Report Authenticated By: Burman Nieves, M.D.    Ir Perc Cholecystostomy  12/13/2012  *RADIOLOGY REPORT*  Indication: Acute cholecystitis, poor surgical candidate  ULTRASOUND AND FLUOROSCOPIC-GUIDED CHOLECYSTOSTOMY TUBE PLACEMENT  Comparisons: CT abdomen pelvis - earlier same day  Medications: Fentanyl 200 mcg IV; The patient is currently admitted to the hospital and on intravenous antibiotics.  Antibiotics were administered with an appropriate time frame prior to skin puncture.  Contrast: 10 ml Omnipaque-300 administered into the gallbladder fossa  Total Moderate Sedation Time: 15 minutes  Fluoroscopy Time: 1.4 minutes.  Complications: None immediate  Technique / Findings:  Informed written consent was obtained from the patient's  wife after a discussion of the risks, benefits and alternatives to treatment. Questions regarding the procedure were encouraged and answered.  A timeout was performed prior to the initiation of the procedure.  The right upper abdominal quadrant was prepped and draped in the usual sterile fashion, and a sterile drape was applied covering the operative field.  Maximum barrier sterile technique with sterile gowns and gloves were used for the procedure.  A timeout was performed prior to the initiation of the procedure.  Local anesthesia was provided with 1% lidocaine with epinephrine.  Ultrasound scanning of the right upper quadrant demonstrates a markedly dilated gallbladder.  Of note, the patient reported pain with ultrasound imaging over the gallbladder.  Utilizing a transhepatic approach, a 22 gauge needle was advanced into the gallbladder under direct ultrasound guidance.  An ultrasound image was saved for documentation purposes.  Appropriate intraluminal puncture was confirmed with the efflux of bile and advancement of an 0.018 wire into the gallbladder lumen.  The needle was exchanged for an Accustick set.  A small amount of contrast was injected to confirm appropriate intraluminal positioning.  Over a Benson wire, a 10.2-French Cook cholecystomy tube was advanced into the gallbladder fossa, coiled and locked.  Bile was aspirated and a small amount of contrast was injected as a post procedural spot radiographic image was obtained.  The catheter was secured to the skin with suture, connected to a drainage bag and a dressing was placed. A small amount of bile was aspirated and sent to the laboratory for analysis.  The patient tolerated the procedure well without immediate postprocedural complication.  Impression:  Successful ultrasound and fluoroscopic guided placement of a 10.2 French cholecystostomy tube.  A small amount of bile was sent to the laboratory for analysis.   Original Report Authenticated By: Tacey Ruiz, MD     Medications: Scheduled Meds:    . donepezil  10 mg Oral QHS  . insulin aspart  0-9 Units Subcutaneous TID WC  . metoprolol      . metoprolol  3 mg Intravenous STAT  . piperacillin-tazobactam (ZOSYN)  IV  3.375 g Intravenous Q8H  . Tamsulosin HCl  0.4 mg Oral Daily  . traMADol  50 mg Oral BID  . vancomycin  1,500 mg Intravenous QHS   Continuous Infusions:    . sodium chloride Stopped (12/13/12 0009)  . sodium chloride 75 mL/hr at 12/13/12 0010   PRN Meds:.acetaminophen, acetaminophen, acetaminophen, albuterol, HYDROmorphone (DILAUDID) injection  Assessment/Plan:  1. Sepsis: likely due to cholecystitis, improving empiric abx, Vanc/Zosyn Fu blood and urine CX Lactic acid and procalcitonin normal yesterday Poor UO: Stat NS bolus, followed by NS at 125cc/hr, strict I/Os  2. Acute cholecystitis, acalculous S/p Perc drain per IR last PM Continue mgt per CCS and IR Empiric  IV Vanc/Zosyn, IVF  3. Large L rectus  sheath hematoma Unclear when this developed, wife recalls some trauma to abd with a safety belt tugging on his abd over a week ago when he was on coumadin, his INR improved,  received vitamin K yesterday, coumadin is still on hold since lower GI bleed and admission last week. Hb relatively stable Monitor CBC closely, type and screen INR in am  4. H/o DVT/PE in 05/2011 Anticoagulation on hold since lower GI bleed last week, continue to hold this Stop anticoagulation  5. Dementia: stable  6. BPH continue flomax, monitor hematuria  Code status:FULL code: discussed this with wife again, they want everything done in an emergency Family communication: discussed with wife by telephone     Time spent coordinating care:   LOS: 2 days   [facility] Pager: [phone] 12/14/2012, 7:15 AM

## 2012-12-14 NOTE — Progress Notes (Signed)
Subjective: Smiling talking and seem markedly improved from yesterday. No  Complaints of pain.  Objective: Vital signs in last 24 hours: Temp:  [98.2 F (36.8 C)-101.3 F (38.5 C)] 98.2 F (36.8 C) (01/16 0800) Pulse Rate:  [84-153] 96  (01/16 0900) Resp:  [8-35] 21  (01/16 0900) BP: (74-162)/(41-91) 105/59 mmHg (01/16 0800) SpO2:  [92 %-100 %] 95 % (01/16 0900)  TM 101.3 at 1500 hrs, down to normal now. WBC still up and creatinine is still up  Intake/Output from previous day: 01/15 0701 - 01/16 0700 In: 2075 [I.V.:1425; IV Piggyback:650] Out: 550 [Urine:200; Drains:350] Intake/Output this shift: Total I/O In: 750 [I.V.:250; IV Piggyback:500] Out: 150 [Urine:150]  General appearance: alert, cooperative and no distress Resp: clear to auscultation bilaterally GI: soft, non-tender; bowel sounds normal; no masses,  no organomegaly and no pain with palpation, drain working fine.  Lab Results:   [facility] 12/14/12 0415 12/14/12 0325  WBC 17.5* 17.2*  HGB 9.1* 9.4*  HCT 26.8* 27.5*  PLT 382 376    BMET  Basename 12/14/12 0415 12/14/12 0325  NA 135 134*  K 4.8 4.5  CL 104 103  CO2 21 19  GLUCOSE 152* 152*  BUN 21 20  CREATININE 1.66* 1.52*  CALCIUM 8.3* 8.1*   PT/INR  Basename 12/14/12 0325 12/13/12 0225  LABPROT 18.4* 20.3*  INR 1.58* 1.81*     Lab 12/14/12 0325 12/13/12 0225 12/12/12 1618 12/08/12 1824  AST 11 15 15 13   ALT 9 11 11 8   ALKPHOS 65 74 82 72  BILITOT 1.8* 2.9* 2.0* 1.2  PROT 6.1 7.2 7.7 7.8  ALBUMIN 2.1* 2.6* 2.8* 2.8*     Lipase  No results found for this basename: lipase     Studies/Results: Dg Chest 2 View  12/12/2012  *RADIOLOGY REPORT*  Clinical Data: Fever, weakness  CHEST - 2 VIEW  Comparison: 12/01/2012  Findings: Enlargement of cardiac silhouette. Tortuous aorta. Pulmonary vascularity normal. Chronic bibasilar atelectasis versus scarring. Remaining lungs clear. No pleural effusion or pneumothorax. Scattered endplate spur  formation thoracic spine.  IMPRESSION: Enlargement of cardiac silhouette. Chronic bibasilar atelectasis versus scarring.   Original Report Authenticated By: Ulyses Southward, M.D.    Ct Head Wo Contrast  12/12/2012  *RADIOLOGY REPORT*  Clinical Data: Weakness, dementia.  CT HEAD WITHOUT CONTRAST  Technique:  Contiguous axial images were obtained from the base of the skull through the vertex without contrast.  Comparison: 06/21/2011  Findings: Diffuse parenchymal atrophy. Patchy areas of hypoattenuation in deep and periventricular white matter bilaterally. Negative for acute intracranial hemorrhage, mass lesion, acute infarction, midline shift, or mass-effect. Acute infarct may be inapparent on noncontrast CT. Ventricles and sulci symmetric. Bone windows demonstrate no focal lesion.  IMPRESSION:  1. Negative for bleed or other acute intracranial process.  2. Atrophy and nonspecific white matter changes   Original Report Authenticated By: D. Andria Rhein, MD    Ct Abdomen Pelvis W Contrast  12/13/2012  *RADIOLOGY REPORT*  Clinical Data: Fever.  Weakness.  Abdominal distension.  Dysuria. Urinary frequency.  White cell count 13.4.  CT ABDOMEN AND PELVIS WITH CONTRAST  Technique:  Multidetector CT imaging of the abdomen and pelvis was performed following the standard protocol during bolus administration of intravenous contrast.  Contrast: OMNIPAQUE IOHEXOL 300 MG/ML  SOLN  Comparison: None.  Findings: There is infiltration and / or atelectasis in both lung bases.  Small esophageal hiatal hernia.  The gallbladder is distended with thickened edematous wall and infiltration in the pericholecystic  fat.  No opaque stones are demonstrated.  Changes are consistent with acalculous cholecystitis in the appropriate clinical setting.  The liver, spleen, pancreas, adrenal glands, and retroperitoneal lymph nodes are unremarkable. The kidneys are mildly atrophic.  Left renal cysts.  No hydronephrosis or solid mass identified.   Calcification of the aorta without aneurysm.  The stomach, small bowel, and colon are not abnormally distended.  Infiltration in the fat of the right pericolic gutter extending down from the gallbladder.  No free fluid or free air in the abdomen.  Pelvis:  There is a hematoma in the inferior left rectus abdominous muscle extending to the suprapubic region and right inferior rectus sheath.  The hematoma measures up to about 290.8 x 10.2 x 9 cm. The prostate gland is diffusely enlarged, measuring 6.6 x 5.7 cm. The bladder is decompressed with suggestion of bladder wall thickening.  Calcification in the seminal vesicles.  Diverticular changes in the sigmoid colon without diverticulitis.  The appendix is normal.  No significant pelvic lymphadenopathy.  Degenerative changes in the lumbar spine.  IMPRESSION: Gallbladder distension and inflammatory changes suggesting cholecystitis although no opaque stones are demonstrated.  Large inferior rectus sheath hematoma.  Diffuse prostate enlargement. Bladder wall thickening.   Original Report Authenticated By: Burman Nieves, M.D.    Ir Perc Cholecystostomy  12/13/2012  *RADIOLOGY REPORT*  Indication: Acute cholecystitis, poor surgical candidate  ULTRASOUND AND FLUOROSCOPIC-GUIDED CHOLECYSTOSTOMY TUBE PLACEMENT  Comparisons: CT abdomen pelvis - earlier same day  Medications: Fentanyl 200 mcg IV; The patient is currently admitted to the hospital and on intravenous antibiotics.  Antibiotics were administered with an appropriate time frame prior to skin puncture.  Contrast: 10 ml Omnipaque-300 administered into the gallbladder fossa  Total Moderate Sedation Time: 15 minutes  Fluoroscopy Time: 1.4 minutes.  Complications: None immediate  Technique / Findings:  Informed written consent was obtained from the patient's wife after a discussion of the risks, benefits and alternatives to treatment. Questions regarding the procedure were encouraged and answered.  A timeout was performed  prior to the initiation of the procedure.  The right upper abdominal quadrant was prepped and draped in the usual sterile fashion, and a sterile drape was applied covering the operative field.  Maximum barrier sterile technique with sterile gowns and gloves were used for the procedure.  A timeout was performed prior to the initiation of the procedure.  Local anesthesia was provided with 1% lidocaine with epinephrine.  Ultrasound scanning of the right upper quadrant demonstrates a markedly dilated gallbladder.  Of note, the patient reported pain with ultrasound imaging over the gallbladder.  Utilizing a transhepatic approach, a 22 gauge needle was advanced into the gallbladder under direct ultrasound guidance.  An ultrasound image was saved for documentation purposes.  Appropriate intraluminal puncture was confirmed with the efflux of bile and advancement of an 0.018 wire into the gallbladder lumen.  The needle was exchanged for an Accustick set.  A small amount of contrast was injected to confirm appropriate intraluminal positioning.  Over a Benson wire, a 10.2-French Cook cholecystomy tube was advanced into the gallbladder fossa, coiled and locked.  Bile was aspirated and a small amount of contrast was injected as a post procedural spot radiographic image was obtained.  The catheter was secured to the skin with suture, connected to a drainage bag and a dressing was placed. A small amount of bile was aspirated and sent to the laboratory for analysis.  The patient tolerated the procedure well without immediate postprocedural complication.  Impression:  Successful ultrasound and fluoroscopic guided placement of a 10.2 French cholecystostomy tube.  A small amount of bile was sent to the laboratory for analysis.   Original Report Authenticated By: Tacey Ruiz, MD     Medications:    . donepezil  10 mg Oral QHS  . insulin aspart  0-9 Units Subcutaneous TID WC  . metoprolol      . metoprolol  3 mg Intravenous  STAT  . piperacillin-tazobactam (ZOSYN)  IV  3.375 g Intravenous Q8H  . Tamsulosin HCl  0.4 mg Oral Daily  . traMADol  50 mg Oral BID  . vancomycin  1,500 mg Intravenous QHS    Assessment/Plan 1. Abdominal pain with probable a calculus cholecystitis, possible sepsis.  2. Large left rectus sheath hematoma  3. Recent GI bleed with transfusion. (Hospitalize 1/10 through 12/10/12)  4. History of pulmonary embolus/DVT on chronic Coumadin therapy.  5. Dementia  6. Type 2 diabetes mellitus  7. Hypertension  8. Dyslipidemia  9. Osteoarthritis  9. History of diverticulitis.  10. PCM with a pre-albumen of 10.6.  11. Deconditioning walks with a walker for the last 2 years.   Plan:  Successful Percutaneous cholecystostomy tube.  WBC still up but that should improve.  Continue current Rx.  LOS: 2 days    Samuel Ryan 12/14/2012

## 2012-12-14 NOTE — Progress Notes (Signed)
Subjective: Pt feeling much better already this am. Mild pain in RUQ  Objective: Physical Exam: BP 105/59  Pulse 96  Temp 98.2 F (36.8 C) (Oral)  Resp 21  Ht 6\' 2"  (1.88 m)  Wt 240 lb (108.863 kg)  BMI 30.81 kg/m2  SpO2 95% RUQ perc chole drain intact, site clean, NT Output bilious with sludge and purulence.   Labs: CBC  Basename 12/14/12 0415 12/14/12 0325  WBC 17.5* 17.2*  HGB 9.1* 9.4*  HCT 26.8* 27.5*  PLT 382 376   BMET  Basename 12/14/12 0415 12/14/12 0325  NA 135 134*  K 4.8 4.5  CL 104 103  CO2 21 19  GLUCOSE 152* 152*  BUN 21 20  CREATININE 1.66* 1.52*  CALCIUM 8.3* 8.1*   LFT  Basename 12/14/12 0325  PROT 6.1  ALBUMIN 2.1*  AST 11  ALT 9  ALKPHOS 65  BILITOT 1.8*  BILIDIR --  IBILI --  LIPASE --   PT/INR  Basename 12/14/12 0325 12/13/12 0225  LABPROT 18.4* 20.3*  INR 1.58* 1.81*     Studies/Results: Dg Chest 2 View  12/12/2012  *RADIOLOGY REPORT*  Clinical Data: Fever, weakness  CHEST - 2 VIEW  Comparison: 12/01/2012  Findings: Enlargement of cardiac silhouette. Tortuous aorta. Pulmonary vascularity normal. Chronic bibasilar atelectasis versus scarring. Remaining lungs clear. No pleural effusion or pneumothorax. Scattered endplate spur formation thoracic spine.  IMPRESSION: Enlargement of cardiac silhouette. Chronic bibasilar atelectasis versus scarring.   Original Report Authenticated By: Ulyses Southward, M.D.    Ct Head Wo Contrast  12/12/2012  *RADIOLOGY REPORT*  Clinical Data: Weakness, dementia.  CT HEAD WITHOUT CONTRAST  Technique:  Contiguous axial images were obtained from the base of the skull through the vertex without contrast.  Comparison: 06/21/2011  Findings: Diffuse parenchymal atrophy. Patchy areas of hypoattenuation in deep and periventricular white matter bilaterally. Negative for acute intracranial hemorrhage, mass lesion, acute infarction, midline shift, or mass-effect. Acute infarct may be inapparent on noncontrast CT.  Ventricles and sulci symmetric. Bone windows demonstrate no focal lesion.  IMPRESSION:  1. Negative for bleed or other acute intracranial process.  2. Atrophy and nonspecific white matter changes   Original Report Authenticated By: D. Andria Rhein, MD    Ct Abdomen Pelvis W Contrast  12/13/2012  *RADIOLOGY REPORT*  Clinical Data: Fever.  Weakness.  Abdominal distension.  Dysuria. Urinary frequency.  White cell count 13.4.  CT ABDOMEN AND PELVIS WITH CONTRAST  Technique:  Multidetector CT imaging of the abdomen and pelvis was performed following the standard protocol during bolus administration of intravenous contrast.  Contrast: OMNIPAQUE IOHEXOL 300 MG/ML  SOLN  Comparison: None.  Findings: There is infiltration and / or atelectasis in both lung bases.  Small esophageal hiatal hernia.  The gallbladder is distended with thickened edematous wall and infiltration in the pericholecystic fat.  No opaque stones are demonstrated.  Changes are consistent with acalculous cholecystitis in the appropriate clinical setting.  The liver, spleen, pancreas, adrenal glands, and retroperitoneal lymph nodes are unremarkable. The kidneys are mildly atrophic.  Left renal cysts.  No hydronephrosis or solid mass identified.  Calcification of the aorta without aneurysm.  The stomach, small bowel, and colon are not abnormally distended.  Infiltration in the fat of the right pericolic gutter extending down from the gallbladder.  No free fluid or free air in the abdomen.  Pelvis:  There is a hematoma in the inferior left rectus abdominous muscle extending to the suprapubic region and right inferior rectus  sheath.  The hematoma measures up to about 290.8 x 10.2 x 9 cm. The prostate gland is diffusely enlarged, measuring 6.6 x 5.7 cm. The bladder is decompressed with suggestion of bladder wall thickening.  Calcification in the seminal vesicles.  Diverticular changes in the sigmoid colon without diverticulitis.  The appendix is normal.   No significant pelvic lymphadenopathy.  Degenerative changes in the lumbar spine.  IMPRESSION: Gallbladder distension and inflammatory changes suggesting cholecystitis although no opaque stones are demonstrated.  Large inferior rectus sheath hematoma.  Diffuse prostate enlargement. Bladder wall thickening.   Original Report Authenticated By: Burman Nieves, M.D.    Ir Perc Cholecystostomy  12/13/2012  *RADIOLOGY REPORT*  Indication: Acute cholecystitis, poor surgical candidate  ULTRASOUND AND FLUOROSCOPIC-GUIDED CHOLECYSTOSTOMY TUBE PLACEMENT  Comparisons: CT abdomen pelvis - earlier same day  Medications: Fentanyl 200 mcg IV; The patient is currently admitted to the hospital and on intravenous antibiotics.  Antibiotics were administered with an appropriate time frame prior to skin puncture.  Contrast: 10 ml Omnipaque-300 administered into the gallbladder fossa  Total Moderate Sedation Time: 15 minutes  Fluoroscopy Time: 1.4 minutes.  Complications: None immediate  Technique / Findings:  Informed written consent was obtained from the patient's wife after a discussion of the risks, benefits and alternatives to treatment. Questions regarding the procedure were encouraged and answered.  A timeout was performed prior to the initiation of the procedure.  The right upper abdominal quadrant was prepped and draped in the usual sterile fashion, and a sterile drape was applied covering the operative field.  Maximum barrier sterile technique with sterile gowns and gloves were used for the procedure.  A timeout was performed prior to the initiation of the procedure.  Local anesthesia was provided with 1% lidocaine with epinephrine.  Ultrasound scanning of the right upper quadrant demonstrates a markedly dilated gallbladder.  Of note, the patient reported pain with ultrasound imaging over the gallbladder.  Utilizing a transhepatic approach, a 22 gauge needle was advanced into the gallbladder under direct ultrasound guidance.   An ultrasound image was saved for documentation purposes.  Appropriate intraluminal puncture was confirmed with the efflux of bile and advancement of an 0.018 wire into the gallbladder lumen.  The needle was exchanged for an Accustick set.  A small amount of contrast was injected to confirm appropriate intraluminal positioning.  Over a Benson wire, a 10.2-French Cook cholecystomy tube was advanced into the gallbladder fossa, coiled and locked.  Bile was aspirated and a small amount of contrast was injected as a post procedural spot radiographic image was obtained.  The catheter was secured to the skin with suture, connected to a drainage bag and a dressing was placed. A small amount of bile was aspirated and sent to the laboratory for analysis.  The patient tolerated the procedure well without immediate postprocedural complication.  Impression:  Successful ultrasound and fluoroscopic guided placement of a 10.2 French cholecystostomy tube.  A small amount of bile was sent to the laboratory for analysis.   Original Report Authenticated By: Tacey Ruiz, MD     Assessment/Plan: Acute cholecystitis s/p perc chole drain WBC down to 17k Discussed with family role of drain and that it needs to remain a minimum of 6 weeks unless he needs surgery before then. Will cont to follow   LOS: 2 days    Brayton El PA-C 12/14/2012 9:21 AM

## 2012-12-14 NOTE — Progress Notes (Signed)
CARE MANAGEMENT NOTE 12/14/2012  Patient:  Samuel Ryan, Samuel Ryan   Account Number:  [id number]  Date Initiated:  12/14/2012  Documentation initiated by:  Al Bracewell  Subjective/Objective Assessment:   pt with obstruction of bile stent and duct with poss stone and poss sepsis     Action/Plan:   lives at home   Anticipated DC Date:  12/17/2012   Anticipated DC Plan:  HOME/SELF CARE  In-house referral  NA      DC Planning Services  NA      [facility] Choice  NA   Choice offered to / List presented to:  NA   DME arranged  NA      DME agency  NA     HH arranged  NA      HH agency  NA   Status of service:  In process, will continue to follow Medicare Important Message given?  NA - LOS <3 / Initial given by admissions (If response is "NO", the following Medicare IM given date fields will be blank) Date Medicare IM given:   Date Additional Medicare IM given:    Discharge Disposition:    Per UR Regulation:  Reviewed for med. necessity/level of care/duration of stay  If discussed at Long Length of Stay Meetings, dates discussed:    Comments:  [id number] Earlene Plater, RN, BSN, CCM:  CHART REVIEWED AND UPDATED.  Next chart review due on [id number]. NO DISCHARGE NEEDS PRESENT AT THIS TIME. CASE MANAGEMENT [phone]

## 2012-12-14 NOTE — Progress Notes (Signed)
eLink Physician-Brief Progress Note Patient Name: Samuel Ryan DOB: [birth year] MRN: [id number]  Date of Service  12/14/2012   HPI/Events of Note   HR 153, SVT. BP MAP 50. - all acute  eICU Interventions  Lopressor 3mg  IV stat and monitor Might need IV amio   Intervention Category Major Interventions: Arrhythmia - evaluation and management  Lashe Oliveira 12/14/2012, 3:45 AM

## 2012-12-15 DIAGNOSIS — F0390 Unspecified dementia without behavioral disturbance: Secondary | ICD-10-CM | POA: Diagnosis present

## 2012-12-15 DIAGNOSIS — I10 Essential (primary) hypertension: Secondary | ICD-10-CM

## 2012-12-15 LAB — CBC
Hemoglobin: 8.5 g/dL — ABNORMAL LOW (ref 13.0–17.0)
MCH: 29.7 pg (ref 26.0–34.0)
RBC: 2.86 MIL/uL — ABNORMAL LOW (ref 4.22–5.81)
WBC: 9.3 10*3/uL (ref 4.0–10.5)

## 2012-12-15 LAB — GLUCOSE, CAPILLARY
Glucose-Capillary: 82 mg/dL (ref 70–99)
Glucose-Capillary: 89 mg/dL (ref 70–99)
Glucose-Capillary: 118 mg/dL — ABNORMAL HIGH (ref 70–99)

## 2012-12-15 LAB — BASIC METABOLIC PANEL
CO2: 19 mEq/L (ref 19–32)
Chloride: 111 mEq/L (ref 96–112)
Glucose, Bld: 87 mg/dL (ref 70–99)
Potassium: 4.3 mEq/L (ref 3.5–5.1)
Sodium: 140 mEq/L (ref 135–145)

## 2012-12-15 LAB — MAGNESIUM: Magnesium: 2 mg/dL (ref 1.5–2.5)

## 2012-12-15 LAB — PROTIME-INR: INR: 1.37 (ref 0.00–1.49)

## 2012-12-15 NOTE — Progress Notes (Signed)
ATTENDING ADDENDUM:  I personally reviewed patient's record, examined the patient, and formulated the following assessment and plan:  Doing better after cholecystostomy tube.  LFT's, Cr and WBC down.  Tube will stay in for 6 weeks.  Agree with PA- ok to transfer to floor and advance diet.  Antibiotics per primary team, ok to switch to PO abx if tolerating a diet.

## 2012-12-15 NOTE — Progress Notes (Signed)
Subjective: More alert. Smiling. Feeling better. Spouse present. Some nausea with meal this am.   Objective: Vital signs in last 24 hours: Temp:  [97.8 F (36.6 C)-98.5 F (36.9 C)] 98.2 F (36.8 C) (01/17 0800) Pulse Rate:  [79-102] 87  (01/17 0743) Resp:  [16-25] 18  (01/17 0743) BP: (108-130)/(52-73) 121/64 mmHg (01/17 0743) SpO2:  [93 %-96 %] 95 % (01/17 0743) Weight:  [237 lb 3.4 oz (107.6 kg)] 237 lb 3.4 oz (107.6 kg) (01/17 0000)    Intake/Output from previous day: 01/16 0701 - 01/17 0700 In: 3775 [I.V.:2625; IV Piggyback:1150] Out: 1765 [Urine:1390; Drains:375] Intake/Output this shift: Total I/O In: 170 [P.O.:120; I.V.:50] Out: 200 [Urine:200]  PE: awake, alert and answers all questions appropriately.  Drain to RUQ intact with insertion site clean and dry. Blood tinged bile in bag. Has had great output last 48 hours ( appx. 350 ml daily).  Cultures for bile negative so far. Abdomen soft, mildly tender at drain site. Positive bowel sounds.   Lab Results:   [facility] 12/15/12 0343 12/14/12 0415  WBC 9.3 17.5*  HGB 8.5* 9.1*  HCT 25.5* 26.8*  PLT 389 382   BMET  Basename 12/15/12 0343 12/14/12 0415  NA 140 135  K 4.3 4.8  CL 111 104  CO2 19 21  GLUCOSE 87 152*  BUN 23 21  CREATININE 1.48* 1.66*  CALCIUM 8.1* 8.3*   PT/INR  Basename 12/15/12 0343 12/14/12 0325  LABPROT 16.5* 18.4*  INR 1.37 1.58*   ABG No results found for this basename: PHART:2,PCO2:2,PO2:2,HCO3:2 in the last 72 hours  Studies/Results: Ir Perc Cholecystostomy  12/13/2012  *RADIOLOGY REPORT*  Indication: Acute cholecystitis, poor surgical candidate  ULTRASOUND AND FLUOROSCOPIC-GUIDED CHOLECYSTOSTOMY TUBE PLACEMENT  Comparisons: CT abdomen pelvis - earlier same day  Medications: Fentanyl 200 mcg IV; The patient is currently admitted to the hospital and on intravenous antibiotics.  Antibiotics were administered with an appropriate time frame prior to skin puncture.  Contrast: 10 ml  Omnipaque-300 administered into the gallbladder fossa  Total Moderate Sedation Time: 15 minutes  Fluoroscopy Time: 1.4 minutes.  Complications: None immediate  Technique / Findings:  Informed written consent was obtained from the patient's wife after a discussion of the risks, benefits and alternatives to treatment. Questions regarding the procedure were encouraged and answered.  A timeout was performed prior to the initiation of the procedure.  The right upper abdominal quadrant was prepped and draped in the usual sterile fashion, and a sterile drape was applied covering the operative field.  Maximum barrier sterile technique with sterile gowns and gloves were used for the procedure.  A timeout was performed prior to the initiation of the procedure.  Local anesthesia was provided with 1% lidocaine with epinephrine.  Ultrasound scanning of the right upper quadrant demonstrates a markedly dilated gallbladder.  Of note, the patient reported pain with ultrasound imaging over the gallbladder.  Utilizing a transhepatic approach, a 22 gauge needle was advanced into the gallbladder under direct ultrasound guidance.  An ultrasound image was saved for documentation purposes.  Appropriate intraluminal puncture was confirmed with the efflux of bile and advancement of an 0.018 wire into the gallbladder lumen.  The needle was exchanged for an Accustick set.  A small amount of contrast was injected to confirm appropriate intraluminal positioning.  Over a Benson wire, a 10.2-French Cook cholecystomy tube was advanced into the gallbladder fossa, coiled and locked.  Bile was aspirated and a small amount of contrast was injected as a post procedural  spot radiographic image was obtained.  The catheter was secured to the skin with suture, connected to a drainage bag and a dressing was placed. A small amount of bile was aspirated and sent to the laboratory for analysis.  The patient tolerated the procedure well without immediate  postprocedural complication.  Impression:  Successful ultrasound and fluoroscopic guided placement of a 10.2 French cholecystostomy tube.  A small amount of bile was sent to the laboratory for analysis.   Original Report Authenticated By: Tacey Ruiz, MD     Anti-infectives: Anti-infectives     Start     Dose/Rate Route Frequency Ordered Stop   12/13/12 2200   vancomycin (VANCOCIN) 1,500 mg in sodium chloride 0.9 % 500 mL IVPB  Status:  Discontinued        1,500 mg 250 mL/hr over 120 Minutes Intravenous Daily at bedtime 12/13/12 0245 12/15/12 0716   12/13/12 0600   piperacillin-tazobactam (ZOSYN) IVPB 3.375 g        3.375 g 12.5 mL/hr over 240 Minutes Intravenous 3 times per day 12/13/12 0245     12/13/12 0000   piperacillin-tazobactam (ZOSYN) IVPB 3.375 g        3.375 g 100 mL/hr over 30 Minutes Intravenous  Once 12/12/12 2346 12/13/12 0100   12/13/12 0000   vancomycin (VANCOCIN) 1,500 mg in sodium chloride 0.9 % 500 mL IVPB        1,500 mg 250 mL/hr over 120 Minutes Intravenous  Once 12/12/12 2346 12/13/12 0615   12/12/12 2215   cefTRIAXone (ROCEPHIN) 1 g in dextrose 5 % 50 mL IVPB        1 g 100 mL/hr over 30 Minutes Intravenous  Once 12/12/12 2211 12/13/12 0008          Assessment/Plan: Cholecystis s/p percutaneous drain placement 1/15 with resolving leukocytosis and improved mental status. To continue drain minimum of 6 weeks.  IR to follow few more days to ensure functioning properly.     LOS: 3 days    CAMPBELL,PAMELA D 12/15/2012

## 2012-12-15 NOTE — Progress Notes (Signed)
Patient ID: Samuel Ryan, male   DOB: 1931/09/26, 79 y.o.   MRN: [id number]    Subjective: Continues to feel better, denies pain/n/v.  Wants to start eating  Objective: Vital signs in last 24 hours: Temp:  [97.8 F (36.6 C)-98.5 F (36.9 C)] 98.3 F (36.8 C) (01/17 0400) Pulse Rate:  [79-102] 89  (01/17 0400) Resp:  [16-25] 19  (01/17 0400) BP: (105-130)/(52-73) 124/68 mmHg (01/17 0400) SpO2:  [93 %-96 %] 94 % (01/17 0400) Weight:  [237 lb 3.4 oz (107.6 kg)] 237 lb 3.4 oz (107.6 kg) (01/17 0000)  TM 101.3 at 1500 hrs, down to normal now. WBC still up and creatinine is still up  Intake/Output from previous day: 01/16 0701 - 01/17 0700 In: 3775 [I.V.:2625; IV Piggyback:1150] Out: 1765 [Urine:1390; Drains:375] Intake/Output this shift:    General appearance: alert, cooperative and no distress Resp: clear to auscultation bilaterally GI: soft, nontender, drain with bilious output (375cc/24hr), +BS  Lab Results:   [facility] 12/15/12 0343 12/14/12 0415  WBC 9.3 17.5*  HGB 8.5* 9.1*  HCT 25.5* 26.8*  PLT 389 382    BMET  Basename 12/15/12 0343 12/14/12 0415  NA 140 135  K 4.3 4.8  CL 111 104  CO2 19 21  GLUCOSE 87 152*  BUN 23 21  CREATININE 1.48* 1.66*  CALCIUM 8.1* 8.3*   PT/INR  Basename 12/15/12 0343 12/14/12 0325  LABPROT 16.5* 18.4*  INR 1.37 1.58*     Lab 12/14/12 0325 12/13/12 0225 12/12/12 1618 12/08/12 1824  AST 11 15 15 13   ALT 9 11 11 8   ALKPHOS 65 74 82 72  BILITOT 1.8* 2.9* 2.0* 1.2  PROT 6.1 7.2 7.7 7.8  ALBUMIN 2.1* 2.6* 2.8* 2.8*     Lipase  No results found for this basename: lipase     Studies/Results: Ir Perc Cholecystostomy  12/13/2012  *RADIOLOGY REPORT*  Indication: Acute cholecystitis, poor surgical candidate  ULTRASOUND AND FLUOROSCOPIC-GUIDED CHOLECYSTOSTOMY TUBE PLACEMENT  Comparisons: CT abdomen pelvis - earlier same day  Medications: Fentanyl 200 mcg IV; The patient is currently admitted to the hospital and on intravenous  antibiotics.  Antibiotics were administered with an appropriate time frame prior to skin puncture.  Contrast: 10 ml Omnipaque-300 administered into the gallbladder fossa  Total Moderate Sedation Time: 15 minutes  Fluoroscopy Time: 1.4 minutes.  Complications: None immediate  Technique / Findings:  Informed written consent was obtained from the patient's wife after a discussion of the risks, benefits and alternatives to treatment. Questions regarding the procedure were encouraged and answered.  A timeout was performed prior to the initiation of the procedure.  The right upper abdominal quadrant was prepped and draped in the usual sterile fashion, and a sterile drape was applied covering the operative field.  Maximum barrier sterile technique with sterile gowns and gloves were used for the procedure.  A timeout was performed prior to the initiation of the procedure.  Local anesthesia was provided with 1% lidocaine with epinephrine.  Ultrasound scanning of the right upper quadrant demonstrates a markedly dilated gallbladder.  Of note, the patient reported pain with ultrasound imaging over the gallbladder.  Utilizing a transhepatic approach, a 22 gauge needle was advanced into the gallbladder under direct ultrasound guidance.  An ultrasound image was saved for documentation purposes.  Appropriate intraluminal puncture was confirmed with the efflux of bile and advancement of an 0.018 wire into the gallbladder lumen.  The needle was exchanged for an Accustick set.  A small amount of  contrast was injected to confirm appropriate intraluminal positioning.  Over a Benson wire, a 10.2-French Cook cholecystomy tube was advanced into the gallbladder fossa, coiled and locked.  Bile was aspirated and a small amount of contrast was injected as a post procedural spot radiographic image was obtained.  The catheter was secured to the skin with suture, connected to a drainage bag and a dressing was placed. A small amount of bile was  aspirated and sent to the laboratory for analysis.  The patient tolerated the procedure well without immediate postprocedural complication.  Impression:  Successful ultrasound and fluoroscopic guided placement of a 10.2 French cholecystostomy tube.  A small amount of bile was sent to the laboratory for analysis.   Original Report Authenticated By: Tacey Ruiz, MD     Medications:    . antiseptic oral rinse  15 mL Mouth Rinse q12n4p  . chlorhexidine  15 mL Mouth Rinse BID  . donepezil  10 mg Oral QHS  . insulin aspart  0-9 Units Subcutaneous TID WC  . piperacillin-tazobactam (ZOSYN)  IV  3.375 g Intravenous Q8H  . Tamsulosin HCl  0.4 mg Oral Daily  . traMADol  50 mg Oral BID    Assessment/Plan 1. Abdominal pain with probable a calculus cholecystitis, possible sepsis: s/p perc drain, WBC normalized  --continue zosyn  --OK to start diet but advance slowly  --ok from surgical standpoint to transfer to floor  --no other changes from surgical standpoint 2. Large left rectus sheath hematoma  3. Recent GI bleed with transfusion. (Hospitalize 1/10 through 12/10/12)  4. History of pulmonary embolus/DVT on chronic Coumadin therapy.  5. Dementia  6. Type 2 diabetes mellitus  7. Hypertension  8. Dyslipidemia  9. Osteoarthritis  9. History of diverticulitis.  10. PCM with a pre-albumen of 10.6.  11. Deconditioning walks with a walker for the last 2 years.     LOS: 3 days    Liem Copenhaver 12/15/2012

## 2012-12-15 NOTE — Progress Notes (Signed)
[facility]             Progress Note   Subjective: Feels well, no complaints, anxious to eat per RN  Objective: Vital signs in last 24 hours: Temp:  [97.8 F (36.6 C)-98.5 F (36.9 C)] 98.3 F (36.8 C) (01/17 0400) Pulse Rate:  [79-102] 89  (01/17 0400) Resp:  [16-25] 19  (01/17 0400) BP: (105-130)/(52-73) 124/68 mmHg (01/17 0400) SpO2:  [93 %-96 %] 94 % (01/17 0400) Weight:  [107.6 kg (237 lb 3.4 oz)] 107.6 kg (237 lb 3.4 oz) (01/17 0000) Weight change:     Intake/Output from previous day: 01/16 0701 - 01/17 0700 In: 3775 [I.V.:2625; IV Piggyback:1150] Out: 1765 [Urine:1390; Drains:375]     Physical Exam: General: awake, alert, oriented to place and partly to time, in no acute distress. HEENT: No bruits, no goiter. Heart: Regular rate and rhythm, without murmurs, rubs, gallops. Lungs: decreased BS at bases Abdomen: Soft, distended, RUQ less tender, no rigidity/rebound, echymoses in L lateral abd wall, decreased bowel sounds. Extremities: No clubbing cyanosis or edema with positive pedal pulses. Neuro: Grossly intact, nonfocal.    Lab Results: Basic Metabolic Panel:  Basename 12/15/12 0343 12/14/12 0415  NA 140 135  K 4.3 4.8  CL 111 104  CO2 19 21  GLUCOSE 87 152*  BUN 23 21  CREATININE 1.48* 1.66*  CALCIUM 8.1* 8.3*  MG 2.0 1.9  PHOS -- 4.5   Liver Function Tests:  Basename 12/14/12 0325 12/13/12 0225  AST 11 15  ALT 9 11  ALKPHOS 65 74  BILITOT 1.8* 2.9*  PROT 6.1 7.2  ALBUMIN 2.1* 2.6*   No results found for this basename: LIPASE:2,AMYLASE:2 in the last 72 hours No results found for this basename: AMMONIA:2 in the last 72 hours CBC:  Basename 12/15/12 0343 12/14/12 0415 12/12/12 1618  WBC 9.3 17.5* --  NEUTROABS -- -- 11.0*  HGB 8.5* 9.1* --  HCT 25.5* 26.8* --  MCV 89.2 88.7 --  PLT 389 382 --   Cardiac Enzymes:  Basename 12/14/12 0415  CKTOTAL 78  CKMB 3.0  CKMBINDEX --  TROPONINI <0.30   BNP:  Basename  12/14/12 0414 12/12/12 1618  PROBNP 1483.0* 534.3*   D-Dimer:  Basename 12/12/12 2359  DDIMER 11.26*   CBG:  Basename 12/14/12 2110 12/14/12 1619 12/14/12 1250 12/14/12 0751 12/13/12 2231 12/13/12 1848  GLUCAP 93 83 94 117* 168* 144*   Hemoglobin A1C:  Basename 12/13/12 0225  HGBA1C 6.9*   Fasting Lipid Panel: No results found for this basename: CHOL,HDL,LDLCALC,TRIG,CHOLHDL,LDLDIRECT in the last 72 hours Thyroid Function Tests: No results found for this basename: TSH,T4TOTAL,FREET4,T3FREE,THYROIDAB in the last 72 hours Anemia Panel: No results found for this basename: VITAMINB12,FOLATE,FERRITIN,TIBC,IRON,RETICCTPCT in the last 72 hours Coagulation:  Basename 12/15/12 0343 12/14/12 0325  LABPROT 16.5* 18.4*  INR 1.37 1.58*   Urine Drug Screen: Drugs of Abuse  No results found for this basename: labopia,  cocainscrnur,  labbenz,  amphetmu,  thcu,  labbarb    Alcohol Level: No results found for this basename: ETH:2 in the last 72 hours Urinalysis:  Basename 12/12/12 1910  COLORURINE AMBER*  LABSPEC 1.020  PHURINE 6.5  GLUCOSEU NEGATIVE  HGBUR LARGE*  BILIRUBINUR SMALL*  KETONESUR NEGATIVE  PROTEINUR 100*  UROBILINOGEN 4.0*  NITRITE NEGATIVE  LEUKOCYTESUR NEGATIVE    Recent Results (from the past 240 hour(s))  URINE CULTURE     Status: Normal   Collection Time   12/12/12  7:10 PM  Component Value Range Status Comment   Specimen Description URINE, RANDOM   Final    Special Requests Normal   Final    Culture  Setup Time 12/13/2012 06:31   Final    Colony Count NO GROWTH   Final    Culture NO GROWTH   Final    Report Status 12/14/2012 FINAL   Final   CULTURE, BLOOD (ROUTINE X 2)     Status: Normal (Preliminary result)   Collection Time   12/12/12 10:37 PM      Component Value Range Status Comment   Specimen Description BLOOD LEFT HAND   Final    Special Requests BOTTLES DRAWN AEROBIC AND ANAEROBIC 5CC   Final    Culture  Setup Time 12/13/2012 06:16    Final    Culture     Final    Value:        BLOOD CULTURE RECEIVED NO GROWTH TO DATE CULTURE WILL BE HELD FOR 5 DAYS BEFORE ISSUING A FINAL NEGATIVE REPORT   Report Status PENDING   Incomplete   CULTURE, BLOOD (ROUTINE X 2)     Status: Normal (Preliminary result)   Collection Time   12/12/12 10:43 PM      Component Value Range Status Comment   Specimen Description BLOOD RIGHT ANTECUBITAL   Final    Special Requests BOTTLES DRAWN AEROBIC AND ANAEROBIC 5CC   Final    Culture  Setup Time 12/13/2012 06:16   Final    Culture     Final    Value:        BLOOD CULTURE RECEIVED NO GROWTH TO DATE CULTURE WILL BE HELD FOR 5 DAYS BEFORE ISSUING A FINAL NEGATIVE REPORT   Report Status PENDING   Incomplete   MRSA PCR SCREENING     Status: Normal   Collection Time   12/13/12  1:29 PM      Component Value Range Status Comment   MRSA by PCR NEGATIVE  NEGATIVE Final   CULTURE, ROUTINE-ABSCESS     Status: Normal (Preliminary result)   Collection Time   12/13/12  5:12 PM      Component Value Range Status Comment   Specimen Description DRAINAGE   Final    Special Requests Normal   Final    Gram Stain     Final    Value: NO WBC SEEN     NO ORGANISMS SEEN   Culture NO GROWTH   Final    Report Status PENDING   Incomplete     Studies/Results: Ir Perc Cholecystostomy  12/13/2012  *RADIOLOGY REPORT*  Indication: Acute cholecystitis, poor surgical candidate  ULTRASOUND AND FLUOROSCOPIC-GUIDED CHOLECYSTOSTOMY TUBE PLACEMENT  Comparisons: CT abdomen pelvis - earlier same day  Medications: Fentanyl 200 mcg IV; The patient is currently admitted to the hospital and on intravenous antibiotics.  Antibiotics were administered with an appropriate time frame prior to skin puncture.  Contrast: 10 ml Omnipaque-300 administered into the gallbladder fossa  Total Moderate Sedation Time: 15 minutes  Fluoroscopy Time: 1.4 minutes.  Complications: None immediate  Technique / Findings:  Informed written consent was obtained from  the patient's wife after a discussion of the risks, benefits and alternatives to treatment. Questions regarding the procedure were encouraged and answered.  A timeout was performed prior to the initiation of the procedure.  The right upper abdominal quadrant was prepped and draped in the usual sterile fashion, and a sterile drape was applied covering the operative field.  Maximum barrier sterile technique  with sterile gowns and gloves were used for the procedure.  A timeout was performed prior to the initiation of the procedure.  Local anesthesia was provided with 1% lidocaine with epinephrine.  Ultrasound scanning of the right upper quadrant demonstrates a markedly dilated gallbladder.  Of note, the patient reported pain with ultrasound imaging over the gallbladder.  Utilizing a transhepatic approach, a 22 gauge needle was advanced into the gallbladder under direct ultrasound guidance.  An ultrasound image was saved for documentation purposes.  Appropriate intraluminal puncture was confirmed with the efflux of bile and advancement of an 0.018 wire into the gallbladder lumen.  The needle was exchanged for an Accustick set.  A small amount of contrast was injected to confirm appropriate intraluminal positioning.  Over a Benson wire, a 10.2-French Cook cholecystomy tube was advanced into the gallbladder fossa, coiled and locked.  Bile was aspirated and a small amount of contrast was injected as a post procedural spot radiographic image was obtained.  The catheter was secured to the skin with suture, connected to a drainage bag and a dressing was placed. A small amount of bile was aspirated and sent to the laboratory for analysis.  The patient tolerated the procedure well without immediate postprocedural complication.  Impression:  Successful ultrasound and fluoroscopic guided placement of a 10.2 French cholecystostomy tube.  A small amount of bile was sent to the laboratory for analysis.   Original Report  Authenticated By: Tacey Ruiz, MD     Medications: Scheduled Meds:    . antiseptic oral rinse  15 mL Mouth Rinse q12n4p  . chlorhexidine  15 mL Mouth Rinse BID  . donepezil  10 mg Oral QHS  . insulin aspart  0-9 Units Subcutaneous TID WC  . piperacillin-tazobactam (ZOSYN)  IV  3.375 g Intravenous Q8H  . Tamsulosin HCl  0.4 mg Oral Daily  . traMADol  50 mg Oral BID   Continuous Infusions:    . sodium chloride 125 mL/hr at 12/14/12 1624   PRN Meds:.acetaminophen, acetaminophen, acetaminophen, albuterol, HYDROmorphone (DILAUDID) injection  Assessment/Plan:  1. Sepsis: due to cholecystitis, improving empiric abx, DC Vanc, continue Zosyn, if cultures remain negative change to PO cipro/flagyl in am Fu blood and drain Cx negative so far Lactic acid and procalcitonin normal Urine output improving, cut down IVF, strict I/Os  2. Acute cholecystitis, acalculous S/p Perc drain per IR 1/15 Continue mgt per CCS and IR Empiric  Abx- IV Vanc/Zosyn, DC Vanc today Clear liq diet  3. Large L rectus sheath hematoma Unclear when this developed, wife recalls some trauma to abd with a safety belt tugging on his abd over a week ago when he was on coumadin, his INR improved,  received vitamin K 1/15, coumadin is still on hold since lower GI bleed and admission last week. Hb relatively stable Monitor CBC closely, type and screen INR in am  4. H/o DVT/PE in 05/2011 Anticoagulation on hold since lower GI bleed last week, continue to hold this Stop anticoagulation  5. Dementia: stable  6. BPH continue flomax, monitor hematuria  Code status:FULL code: discussed this with wife again, they want everything done in an emergency Family communication: discussed with wife by telephone  TX to med-surg PT eval, ambulate   Time spent coordinating care:   LOS: 3 days   [facility] Pager: [phone] 12/15/2012, 7:16 AM

## 2012-12-16 DIAGNOSIS — F0390 Unspecified dementia without behavioral disturbance: Secondary | ICD-10-CM

## 2012-12-16 DIAGNOSIS — K810 Acute cholecystitis: Secondary | ICD-10-CM

## 2012-12-16 LAB — COMPREHENSIVE METABOLIC PANEL
ALT: 9 U/L (ref 0–53)
AST: 14 U/L (ref 0–37)
Calcium: 8.2 mg/dL — ABNORMAL LOW (ref 8.4–10.5)
Creatinine, Ser: 1.38 mg/dL — ABNORMAL HIGH (ref 0.50–1.35)
GFR calc Af Amer: 54 mL/min — ABNORMAL LOW (ref >90)
Sodium: 139 mEq/L (ref 135–145)
Total Protein: 6.1 g/dL (ref 6.0–8.3)

## 2012-12-16 LAB — VITAMIN D 1,25 DIHYDROXY: Vitamin D 1, 25 (OH)2 Total: 22 pg/mL (ref 18–72)

## 2012-12-16 LAB — GLUCOSE, CAPILLARY
Glucose-Capillary: 107 mg/dL — ABNORMAL HIGH (ref 70–99)
Glucose-Capillary: 119 mg/dL — ABNORMAL HIGH (ref 70–99)
Glucose-Capillary: 122 mg/dL — ABNORMAL HIGH (ref 70–99)

## 2012-12-16 LAB — CBC
MCH: 29 pg (ref 26.0–34.0)
MCHC: 32.2 g/dL (ref 30.0–36.0)
MCV: 90.1 fL (ref 78.0–100.0)
Platelets: 411 10*3/uL — ABNORMAL HIGH (ref 150–400)

## 2012-12-16 LAB — PROTIME-INR: Prothrombin Time: 16.2 seconds — ABNORMAL HIGH (ref 11.6–15.2)

## 2012-12-16 MED ORDER — METRONIDAZOLE 250 MG PO TABS
250.0000 mg | ORAL_TABLET | Freq: Three times a day (TID) | ORAL | Status: DC
  Administered 2012-12-16 – 2012-12-19 (×9): 250 mg via ORAL
  Filled 2012-12-16 (×12): qty 1

## 2012-12-16 MED ORDER — PHENOL 1.4 % MT LIQD
1.0000 | OROMUCOSAL | Status: DC | PRN
  Administered 2012-12-16 (×2): 1 via OROMUCOSAL
  Filled 2012-12-16: qty 177

## 2012-12-16 MED ORDER — CIPROFLOXACIN HCL 250 MG PO TABS
250.0000 mg | ORAL_TABLET | Freq: Two times a day (BID) | ORAL | Status: DC
  Administered 2012-12-16 – 2012-12-19 (×7): 250 mg via ORAL
  Filled 2012-12-16 (×9): qty 1

## 2012-12-16 MED ORDER — GUAIFENESIN-DM 100-10 MG/5ML PO SYRP
5.0000 mL | ORAL_SOLUTION | ORAL | Status: DC | PRN
  Administered 2012-12-16 – 2012-12-19 (×4): 5 mL via ORAL
  Filled 2012-12-16 (×4): qty 10

## 2012-12-16 NOTE — Progress Notes (Addendum)
Subjective: Pt doing ok; sitting up in chair; wife in room; denies abd pain  Objective: Vital signs in last 24 hours: Temp:  [98.1 F (36.7 C)-99.8 F (37.7 C)] 99.8 F (37.7 C) (01/18 1400) Pulse Rate:  [72-86] 86  (01/18 1400) Resp:  [18-20] 18  (01/18 1400) BP: (129-157)/(68-85) 157/85 mmHg (01/18 1400) SpO2:  [93 %-96 %] 96 % (01/18 1400) Last BM Date: 12/16/12  Intake/Output from previous day: 01/17 0701 - 01/18 0700 In: 1780 [P.O.:480; I.V.:1200; IV Piggyback:100] Out: 1530 [Urine:1350; Drains:180] Intake/Output this shift: Total I/O In: -  Out: 775 [Urine:700; Drains:75]  Gallbladder drain intact, insertion site ok, output  75 cc's blood-tinged bile, cx's pend; drain flushed with 5 cc's sterile NS without difficulty; t bili 1.0  Lab Results:   [facility] 12/16/12 0455 12/15/12 0343  WBC 5.6 9.3  HGB 7.9* 8.5*  HCT 24.5* 25.5*  PLT 411* 389   BMET  Basename 12/16/12 0455 12/15/12 0343  NA 139 140  K 4.1 4.3  CL 110 111  CO2 21 19  GLUCOSE 114* 87  BUN 18 23  CREATININE 1.38* 1.48*  CALCIUM 8.2* 8.1*   PT/INR  Basename 12/16/12 0455 12/15/12 0343  LABPROT 16.2* 16.5*  INR 1.33 1.37   Results for orders placed during the hospital encounter of 12/12/12  URINE CULTURE     Status: Normal   Collection Time   12/12/12  7:10 PM      Component Value Range Status Comment   Specimen Description URINE, RANDOM   Final    Special Requests Normal   Final    Culture  Setup Time 12/13/2012 06:31   Final    Colony Count NO GROWTH   Final    Culture NO GROWTH   Final    Report Status 12/14/2012 FINAL   Final   CULTURE, BLOOD (ROUTINE X 2)     Status: Normal (Preliminary result)   Collection Time   12/12/12 10:37 PM      Component Value Range Status Comment   Specimen Description BLOOD LEFT HAND   Final    Special Requests BOTTLES DRAWN AEROBIC AND ANAEROBIC 5CC   Final    Culture  Setup Time 12/13/2012 06:16   Final    Culture     Final    Value:        BLOOD  CULTURE RECEIVED NO GROWTH TO DATE CULTURE WILL BE HELD FOR 5 DAYS BEFORE ISSUING A FINAL NEGATIVE REPORT   Report Status PENDING   Incomplete   CULTURE, BLOOD (ROUTINE X 2)     Status: Normal (Preliminary result)   Collection Time   12/12/12 10:43 PM      Component Value Range Status Comment   Specimen Description BLOOD RIGHT ANTECUBITAL   Final    Special Requests BOTTLES DRAWN AEROBIC AND ANAEROBIC 5CC   Final    Culture  Setup Time 12/13/2012 06:16   Final    Culture     Final    Value:        BLOOD CULTURE RECEIVED NO GROWTH TO DATE CULTURE WILL BE HELD FOR 5 DAYS BEFORE ISSUING A FINAL NEGATIVE REPORT   Report Status PENDING   Incomplete   MRSA PCR SCREENING     Status: Normal   Collection Time   12/13/12  1:29 PM      Component Value Range Status Comment   MRSA by PCR NEGATIVE  NEGATIVE Final   CULTURE, ROUTINE-ABSCESS     Status:  Normal (Preliminary result)   Collection Time   12/13/12  5:12 PM      Component Value Range Status Comment   Specimen Description DRAINAGE   Final    Special Requests Normal   Final    Gram Stain     Final    Value: NO WBC SEEN     NO ORGANISMS SEEN   Culture NO GROWTH 2 DAYS   Final    Report Status PENDING   Incomplete     ABG No results found for this basename: PHART:2,PCO2:2,PO2:2,HCO3:2 in the last 72 hours  Studies/Results: No results found.  Anti-infectives: Anti-infectives     Start     Dose/Rate Route Frequency Ordered Stop   12/16/12 1400   metroNIDAZOLE (FLAGYL) tablet 250 mg        250 mg Oral 3 times per day 12/16/12 1049     12/16/12 1130   ciprofloxacin (CIPRO) tablet 250 mg        250 mg Oral 2 times daily 12/16/12 1049     12/13/12 2200   vancomycin (VANCOCIN) 1,500 mg in sodium chloride 0.9 % 500 mL IVPB  Status:  Discontinued        1,500 mg 250 mL/hr over 120 Minutes Intravenous Daily at bedtime 12/13/12 0245 12/15/12 0716   12/13/12 0600   piperacillin-tazobactam (ZOSYN) IVPB 3.375 g  Status:  Discontinued         3.375 g 12.5 mL/hr over 240 Minutes Intravenous 3 times per day 12/13/12 0245 12/16/12 1049   12/13/12 0000  piperacillin-tazobactam (ZOSYN) IVPB 3.375 g       3.375 g 100 mL/hr over 30 Minutes Intravenous  Once 12/12/12 2346 12/13/12 0100   12/13/12 0000   vancomycin (VANCOCIN) 1,500 mg in sodium chloride 0.9 % 500 mL IVPB        1,500 mg 250 mL/hr over 120 Minutes Intravenous  Once 12/12/12 2346 12/13/12 0615   12/12/12 2215   cefTRIAXone (ROCEPHIN) 1 g in dextrose 5 % 50 mL IVPB        1 g 100 mL/hr over 30 Minutes Intravenous  Once 12/12/12 2211 12/13/12 0008          Assessment/Plan: s/p perc cholecystostomy 1/15; monitor labs, check final cx's; drain will need to remain in place at least 4-6 weeks for tract maturation unless cholecystectomy done in interim   LOS: 4 days    Khoen Genet,D [facility] 12/16/2012

## 2012-12-16 NOTE — Progress Notes (Signed)
Patient ID: WENZEL BACKLUND, male   DOB: Oct 07, 1931, 79 y.o.   MRN: [id number]    Subjective: Continues to feel better, denies pain/n/v.  Tolerating his diet  Objective: Vital signs in last 24 hours: Temp:  [98.1 F (36.7 C)-98.5 F (36.9 C)] 98.5 F (36.9 C) (01/18 0524) Pulse Rate:  [72-86] 72  (01/18 0524) Resp:  [18-20] 18  (01/18 0524) BP: (129-144)/(68-75) 136/75 mmHg (01/18 0524) SpO2:  [93 %-95 %] 94 % (01/18 0524) Last BM Date: 01/18/14TM 101.3 at 1500 hrs, down to normal now. WBC still up and creatinine is still up  Intake/Output from previous day: 01/17 0701 - 01/18 0700 In: 1780 [P.O.:480; I.V.:1200; IV Piggyback:100] Out: 1530 [Urine:1350; Drains:180] Intake/Output this shift:   General appearance: alert, cooperative and no distress Resp: clear to auscultation bilaterally GI: soft, nontender, drain with bilious output  Lab Results:   [facility] 12/16/12 0455 12/15/12 0343  WBC 5.6 9.3  HGB 7.9* 8.5*  HCT 24.5* 25.5*  PLT 411* 389    BMET  Basename 12/16/12 0455 12/15/12 0343  NA 139 140  K 4.1 4.3  CL 110 111  CO2 21 19  GLUCOSE 114* 87  BUN 18 23  CREATININE 1.38* 1.48*  CALCIUM 8.2* 8.1*   PT/INR  Basename 12/16/12 0455 12/15/12 0343  LABPROT 16.2* 16.5*  INR 1.33 1.37     Lab 12/16/12 0455 12/14/12 0325 12/13/12 0225 12/12/12 1618  AST 14 11 15 15   ALT 9 9 11 11   ALKPHOS 65 65 74 82  BILITOT 1.0 1.8* 2.9* 2.0*  PROT 6.1 6.1 7.2 7.7  ALBUMIN 2.1* 2.1* 2.6* 2.8*     Lipase  No results found for this basename: lipase     Studies/Results: No results found.  Medications:    . antiseptic oral rinse  15 mL Mouth Rinse q12n4p  . chlorhexidine  15 mL Mouth Rinse BID  . ciprofloxacin  250 mg Oral BID  . donepezil  10 mg Oral QHS  . insulin aspart  0-9 Units Subcutaneous TID WC  . metroNIDAZOLE  250 mg Oral Q8H  . Tamsulosin HCl  0.4 mg Oral Daily  . traMADol  50 mg Oral BID    Assessment/Plan 1. Abdominal pain with probable a  calculus cholecystitis, possible sepsis: s/p perc drain, WBC normalized, doing better  --PO antibiotics  --Advance diet to low fat diet as tolerated  --no other changes from surgical standpoint, will need drain for 6 weeks 2. Large left rectus sheath hematoma  3. Recent GI bleed with transfusion. (Hospitalize 1/10 through 12/10/12)  4. History of pulmonary embolus/DVT on chronic Coumadin therapy.  5. Dementia  6. Type 2 diabetes mellitus  7. Hypertension  8. Dyslipidemia  9. Osteoarthritis  9. History of diverticulitis.  10. PCM with a pre-albumen of 10.6.  11. Deconditioning walks with a walker for the last 2 years.     LOS: 4 days    Jakevion Arney C. 12/16/2012

## 2012-12-16 NOTE — Evaluation (Signed)
Physical Therapy Evaluation Patient Details Name: Samuel Ryan MRN: [id number] DOB: [birth year] Today's Date: 12/16/2012 Time: 1355-1455 PT Time Calculation (min): 60 min  PT Assessment / Plan / Recommendation Clinical Impression  Patient is an 79 y.o. male with h/o dementia admitted with acute cholecystitis s/p perc. drain who presents with decreased activity tolerance, decreased balance, decreased strength all affecting independence with mobility and will benefit from skilled PT in the acute setting to maximize independence and allow return home with famiily assist after inpatient rehab stay.    PT Assessment  Patient needs continued PT services    Follow Up Recommendations  CIR          Equipment Recommendations  None recommended by PT       Frequency Min 3X/week    Precautions / Restrictions Precautions Precautions: Fall Precaution Comments: percutaneous cholesystostomy drain   Pertinent Vitals/Pain Denies pain; BP sitting 150/70      Mobility  Bed Mobility Bed Mobility: Supine to Sit;Sitting - Scoot to Edge of Bed Supine to Sit: 1: +2 Total assist Supine to Sit: Patient Percentage: 50% Sitting - Scoot to Edge of Bed: 1: +2 Total assist Sitting - Scoot to Edge of Bed: Patient Percentage: 50% Details for Bed Mobility Assistance: patient with posterior bias and needed assist to come forward and maintain anterior weight shift Transfers Transfers: Sit to Stand;Stand to Sit;Stand Pivot Transfers Sit to Stand: 1: +2 Total assist;From bed;From chair/3-in-1 Sit to Stand: Patient Percentage: 60% Stand to Sit: 1: +2 Total assist;With armrests;To chair/3-in-1 Stand to Sit: Patient Percentage: 50% Stand Pivot Transfers: 3: Mod assist (with walker bed>recliner>3:1>recliner) Details for Transfer Assistance: Patient with difficulty coming upright standing and maintains crouched posture throughout.  Incontinent of liquid stool after mentioning he needed to use bathroom and  attempting to transfer to 3:1           PT Diagnosis: Abnormality of gait;Generalized weakness  PT Problem List: Decreased strength;Decreased activity tolerance;Decreased knowledge of use of DME;Decreased safety awareness;Decreased balance;Decreased mobility;Decreased cognition PT Treatment Interventions: DME instruction;Gait training;Stair training;Functional mobility training;Patient/family education;Therapeutic activities;Therapeutic exercise;Balance training   PT Goals Acute Rehab PT Goals PT Goal Formulation: With patient/family Time For Goal Achievement: 12/30/12 Potential to Achieve Goals: Good Pt will go Supine/Side to Sit: with min assist PT Goal: Supine/Side to Sit - Progress: Goal set today Pt will Sit at Edge of Bed: with supervision;with unilateral upper extremity support;3-5 min (while performing functional task) PT Goal: Sit at [place] Of Bed - Progress: Goal set today Pt will go Sit to Supine/Side: with min assist PT Goal: Sit to Supine/Side - Progress: Goal set today Pt will Ambulate: 51 - 150 feet;with rolling walker;with min assist PT Goal: Ambulate - Progress: Goal set today  Visit Information  Last PT Received On: 12/16/12    Subjective Data  Subjective: What do you want me to do? Patient Stated Goal: Per wife to get stronger prior to coming home   Prior Functioning  Home Living Lives With: Spouse Available Help at Discharge: Family Type of Home: House Home Layout: One level Home Adaptive Equipment: Walker - rolling Prior Function Level of Independence: Needs assistance Needs Assistance: Gait Gait Assistance: supervision level with walker per wife since illness Communication Communication: HOH    Cognition  Overall Cognitive Status: Impaired Arousal/Alertness: Awake/alert Behavior During Session: WFL for tasks performed Cognition - Other Comments: h/o Alzheimers and per wife not mentating as well today as normal    Extremity/Trunk Assessment Right  Lower Extremity Assessment  RLE ROM/Strength/Tone: Deficits RLE ROM/Strength/Tone Deficits: grossly 4-/5 throughout with weakness evident with attempts at standing Left Lower Extremity Assessment LLE ROM/Strength/Tone: Deficits LLE ROM/Strength/Tone Deficits: grossly 4-/5 throughout with weakness evident with attempts at standing Trunk Assessment Trunk Assessment: Kyphotic   Balance Balance Balance Assessed: Yes Static Sitting Balance Static Sitting - Balance Support: Bilateral upper extremity supported;Feet supported Static Sitting - Level of Assistance: 2: Max assist Static Sitting - Comment/# of Minutes: able to come forward with assist and cues, but could not maintain, falls posteriorly Static Standing Balance Static Standing - Balance Support: Bilateral upper extremity supported Static Standing - Level of Assistance: 4: Min assist;3: Mod assist Static Standing - Comment/# of Minutes: standing for hygiene after incontinent of BM x approx 45 seconds  End of Session PT - End of Session Equipment Utilized During Treatment: Gait belt Activity Tolerance: Patient limited by fatigue Patient left: in chair;with call bell/phone within reach;with family/visitor present  GP     [facility] 12/16/2012, 7:46 PM  Sheran Lawless, PT [phone] 12/16/2012

## 2012-12-16 NOTE — Progress Notes (Addendum)
[facility]             Progress Note   Subjective: Feels well, smiling, occasional mild R sided pain  Objective: Vital signs in last 24 hours: Temp:  [98.1 F (36.7 C)-98.5 F (36.9 C)] 98.5 F (36.9 C) (01/18 0524) Pulse Rate:  [72-86] 72  (01/18 0524) Resp:  [18-20] 18  (01/18 0524) BP: (129-144)/(68-75) 136/75 mmHg (01/18 0524) SpO2:  [93 %-95 %] 94 % (01/18 0524) Weight change:     Intake/Output from previous day: 01/17 0701 - 01/18 0700 In: 1780 [P.O.:480; I.V.:1200; IV Piggyback:100] Out: 1530 [Urine:1350; Drains:180]     Physical Exam: General: awake, alert, oriented to place and partly to time, in no acute distress. HEENT: No bruits, no goiter. Heart: Regular rate and rhythm, without murmurs, rubs, gallops. Lungs: decreased BS at bases Abdomen: Soft, distended, RUQ less tender, no rigidity/rebound, echymoses in L lateral abd wall, decreased bowel sounds. Extremities: No clubbing cyanosis or edema with positive pedal pulses. Neuro: Grossly intact, nonfocal.    Lab Results: Basic Metabolic Panel:  Basename 12/16/12 0455 12/15/12 0343 12/14/12 0415  NA 139 140 --  K 4.1 4.3 --  CL 110 111 --  CO2 21 19 --  GLUCOSE 114* 87 --  BUN 18 23 --  CREATININE 1.38* 1.48* --  CALCIUM 8.2* 8.1* --  MG -- 2.0 1.9  PHOS -- -- 4.5   Liver Function Tests:  Basename 12/16/12 0455 12/14/12 0325  AST 14 11  ALT 9 9  ALKPHOS 65 65  BILITOT 1.0 1.8*  PROT 6.1 6.1  ALBUMIN 2.1* 2.1*   No results found for this basename: LIPASE:2,AMYLASE:2 in the last 72 hours No results found for this basename: AMMONIA:2 in the last 72 hours CBC:  Basename 12/16/12 0455 12/15/12 0343  WBC 5.6 9.3  NEUTROABS -- --  HGB 7.9* 8.5*  HCT 24.5* 25.5*  MCV 90.1 89.2  PLT 411* 389   Cardiac Enzymes:  Basename 12/14/12 0415  CKTOTAL 78  CKMB 3.0  CKMBINDEX --  TROPONINI <0.30   BNP:  Basename 12/14/12 0414  PROBNP 1483.0*   D-Dimer: No results found for  this basename: DDIMER:2 in the last 72 hours CBG:  Basename 12/16/12 0735 12/15/12 2125 12/15/12 1657 12/15/12 1230 12/15/12 0803 12/14/12 2110  GLUCAP 100* 118* 89 89 82 93   Hemoglobin A1C: No results found for this basename: HGBA1C in the last 72 hours Fasting Lipid Panel: No results found for this basename: CHOL,HDL,LDLCALC,TRIG,CHOLHDL,LDLDIRECT in the last 72 hours Thyroid Function Tests: No results found for this basename: TSH,T4TOTAL,FREET4,T3FREE,THYROIDAB in the last 72 hours Anemia Panel: No results found for this basename: VITAMINB12,FOLATE,FERRITIN,TIBC,IRON,RETICCTPCT in the last 72 hours Coagulation:  Basename 12/16/12 0455 12/15/12 0343  LABPROT 16.2* 16.5*  INR 1.33 1.37   Urine Drug Screen: Drugs of Abuse  No results found for this basename: labopia,  cocainscrnur,  labbenz,  amphetmu,  thcu,  labbarb    Alcohol Level: No results found for this basename: ETH:2 in the last 72 hours Urinalysis: No results found for this basename: COLORURINE:2,APPERANCEUR:2,LABSPEC:2,PHURINE:2,GLUCOSEU:2,HGBUR:2,BILIRUBINUR:2,KETONESUR:2,PROTEINUR:2,UROBILINOGEN:2,NITRITE:2,LEUKOCYTESUR:2 in the last 72 hours  Recent Results (from the past 240 hour(s))  URINE CULTURE     Status: Normal   Collection Time   12/12/12  7:10 PM      Component Value Range Status Comment   Specimen Description URINE, RANDOM   Final    Special Requests Normal   Final    Culture  Setup Time 12/13/2012 06:31   Final  Colony Count NO GROWTH   Final    Culture NO GROWTH   Final    Report Status 12/14/2012 FINAL   Final   CULTURE, BLOOD (ROUTINE X 2)     Status: Normal (Preliminary result)   Collection Time   12/12/12 10:37 PM      Component Value Range Status Comment   Specimen Description BLOOD LEFT HAND   Final    Special Requests BOTTLES DRAWN AEROBIC AND ANAEROBIC 5CC   Final    Culture  Setup Time 12/13/2012 06:16   Final    Culture     Final    Value:        BLOOD CULTURE RECEIVED NO GROWTH TO  DATE CULTURE WILL BE HELD FOR 5 DAYS BEFORE ISSUING A FINAL NEGATIVE REPORT   Report Status PENDING   Incomplete   CULTURE, BLOOD (ROUTINE X 2)     Status: Normal (Preliminary result)   Collection Time   12/12/12 10:43 PM      Component Value Range Status Comment   Specimen Description BLOOD RIGHT ANTECUBITAL   Final    Special Requests BOTTLES DRAWN AEROBIC AND ANAEROBIC 5CC   Final    Culture  Setup Time 12/13/2012 06:16   Final    Culture     Final    Value:        BLOOD CULTURE RECEIVED NO GROWTH TO DATE CULTURE WILL BE HELD FOR 5 DAYS BEFORE ISSUING A FINAL NEGATIVE REPORT   Report Status PENDING   Incomplete   MRSA PCR SCREENING     Status: Normal   Collection Time   12/13/12  1:29 PM      Component Value Range Status Comment   MRSA by PCR NEGATIVE  NEGATIVE Final   CULTURE, ROUTINE-ABSCESS     Status: Normal (Preliminary result)   Collection Time   12/13/12  5:12 PM      Component Value Range Status Comment   Specimen Description DRAINAGE   Final    Special Requests Normal   Final    Gram Stain     Final    Value: NO WBC SEEN     NO ORGANISMS SEEN   Culture NO GROWTH 2 DAYS   Final    Report Status PENDING   Incomplete     Studies/Results: No results found.  Medications: Scheduled Meds:    . antiseptic oral rinse  15 mL Mouth Rinse q12n4p  . chlorhexidine  15 mL Mouth Rinse BID  . ciprofloxacin  250 mg Oral BID  . donepezil  10 mg Oral QHS  . insulin aspart  0-9 Units Subcutaneous TID WC  . metroNIDAZOLE  250 mg Oral Q8H  . Tamsulosin HCl  0.4 mg Oral Daily  . traMADol  50 mg Oral BID   Continuous Infusions:   PRN Meds:.acetaminophen, acetaminophen, acetaminophen, albuterol, HYDROmorphone (DILAUDID) injection, phenol  Assessment/Plan:  1. Sepsis: due to cholecystitis, improving empiric abx, DC Vanc, continue Zosyn, cultures remain negative change to PO cipro/flagyl today Fu blood and drain Cx negative so far Lactic acid and procalcitonin normal Urine output  improved, stop IVF  2. Acute cholecystitis, acalculous S/p Perc drain per IR 1/15 Continue mgt per CCS and IR Empiric  Abx- IV Vanc/Zosyn, change to PO cipro/flagyl today for 6 more days advance diet to full  liq diet  3. Large L rectus sheath hematoma Unclear when this developed, wife recalls some trauma to abd with a safety belt tugging on his abd  over a week ago when he was on coumadin, his INR improved,  received vitamin K 1/15, coumadin is still on hold since lower GI bleed and admission last week. Hb trended down slightly with IVF suspect from hemodilution, continue to monitor Monitor CBC   4. H/o DVT/PE in 05/2011 Anticoagulation on hold since lower GI bleed last week, continue to hold this Stopped anticoagulation  5. Dementia: stable  6. BPH continue flomax, monitor hematuria  Code status:FULL code: discussed this with wife again, they want everything done in an emergency Family communication: discussed with wife by telephone  [place] to med-surg PT eval, ambulate   Time spent coordinating care:   LOS: 4 days   [facility] Pager: [phone] 12/16/2012, 10:50 AM

## 2012-12-17 LAB — GLUCOSE, CAPILLARY
Glucose-Capillary: 115 mg/dL — ABNORMAL HIGH (ref 70–99)
Glucose-Capillary: 125 mg/dL — ABNORMAL HIGH (ref 70–99)

## 2012-12-17 LAB — CULTURE, ROUTINE-ABSCESS
Culture: NO GROWTH
Gram Stain: NONE SEEN
Special Requests: NORMAL

## 2012-12-17 LAB — PROTIME-INR: INR: 1.4 (ref 0.00–1.49)

## 2012-12-17 LAB — CBC
HCT: 26.9 % — ABNORMAL LOW (ref 39.0–52.0)
Platelets: 379 10*3/uL (ref 150–400)
RDW: 13.6 % (ref 11.5–15.5)
WBC: 5.8 10*3/uL (ref 4.0–10.5)

## 2012-12-17 LAB — BASIC METABOLIC PANEL
Chloride: 106 mEq/L (ref 96–112)
GFR calc Af Amer: 69 mL/min — ABNORMAL LOW (ref >90)
Potassium: 3.9 mEq/L (ref 3.5–5.1)
Sodium: 135 mEq/L (ref 135–145)

## 2012-12-17 MED ORDER — BENAZEPRIL HCL 10 MG PO TABS
10.0000 mg | ORAL_TABLET | Freq: Every day | ORAL | Status: DC
  Administered 2012-12-17 – 2012-12-19 (×3): 10 mg via ORAL
  Filled 2012-12-17 (×4): qty 1

## 2012-12-17 NOTE — Progress Notes (Signed)
[facility]             Progress Note   Subjective: Feels ok, felt full and didn't eat much, no abd pain/N/V/diarrhea  Objective: Vital signs in last 24 hours: Temp:  [97.9 F (36.6 C)-99.8 F (37.7 C)] 97.9 F (36.6 C) (01/19 0600) Pulse Rate:  [80-90] 80  (01/19 0600) Resp:  [18-20] 20  (01/19 0600) BP: (148-163)/(71-85) 148/79 mmHg (01/19 0600) SpO2:  [94 %-97 %] 94 % (01/19 0600) Weight change:  Last BM Date: 12/16/12  Intake/Output from previous day: 01/18 0701 - 01/19 0700 In: 1728.3 [P.O.:480; I.V.:1248.3] Out: 2965 [Urine:2800; Drains:165]     Physical Exam: General: awake, alert, oriented to place and partly to time, in no acute distress. HEENT: No bruits, no goiter. Heart: Regular rate and rhythm, without murmurs, rubs, gallops. Lungs: decreased BS at bases Abdomen: Soft, distended, RUQ non tender, no rigidity/rebound, echymoses in L lateral abd wall, positive bowel sounds, drain-perc chole Extremities: No clubbing cyanosis or edema with positive pedal pulses. Neuro: Grossly intact, nonfocal.    Lab Results: Basic Metabolic Panel:  Basename 12/17/12 0432 12/16/12 0455 12/15/12 0343  NA 135 139 --  K 3.9 4.1 --  CL 106 110 --  CO2 18* 21 --  GLUCOSE 134* 114* --  BUN 11 18 --  CREATININE 1.13 1.38* --  CALCIUM 8.4 8.2* --  MG -- -- 2.0  PHOS -- -- --   Liver Function Tests:  Basename 12/16/12 0455  AST 14  ALT 9  ALKPHOS 65  BILITOT 1.0  PROT 6.1  ALBUMIN 2.1*   No results found for this basename: LIPASE:2,AMYLASE:2 in the last 72 hours No results found for this basename: AMMONIA:2 in the last 72 hours CBC:  Basename 12/17/12 0432 12/16/12 0455  WBC 5.8 5.6  NEUTROABS -- --  HGB 8.8* 7.9*  HCT 26.9* 24.5*  MCV 88.8 90.1  PLT 379 411*   Cardiac Enzymes: No results found for this basename: CKTOTAL:3,CKMB:3,CKMBINDEX:3,TROPONINI:3 in the last 72 hours BNP: No results found for this basename: PROBNP:3 in the last 72  hours D-Dimer: No results found for this basename: DDIMER:2 in the last 72 hours CBG:  Basename 12/17/12 0757 12/16/12 2210 12/16/12 1816 12/16/12 1218 12/16/12 0735 12/15/12 2125  GLUCAP 125* 122* 119* 107* 100* 118*   Hemoglobin A1C: No results found for this basename: HGBA1C in the last 72 hours Fasting Lipid Panel: No results found for this basename: CHOL,HDL,LDLCALC,TRIG,CHOLHDL,LDLDIRECT in the last 72 hours Thyroid Function Tests: No results found for this basename: TSH,T4TOTAL,FREET4,T3FREE,THYROIDAB in the last 72 hours Anemia Panel: No results found for this basename: VITAMINB12,FOLATE,FERRITIN,TIBC,IRON,RETICCTPCT in the last 72 hours Coagulation:  Basename 12/17/12 0432 12/16/12 0455  LABPROT 16.8* 16.2*  INR 1.40 1.33   Urine Drug Screen: Drugs of Abuse  No results found for this basename: labopia,  cocainscrnur,  labbenz,  amphetmu,  thcu,  labbarb    Alcohol Level: No results found for this basename: ETH:2 in the last 72 hours Urinalysis: No results found for this basename: COLORURINE:2,APPERANCEUR:2,LABSPEC:2,PHURINE:2,GLUCOSEU:2,HGBUR:2,BILIRUBINUR:2,KETONESUR:2,PROTEINUR:2,UROBILINOGEN:2,NITRITE:2,LEUKOCYTESUR:2 in the last 72 hours  Recent Results (from the past 240 hour(s))  URINE CULTURE     Status: Normal   Collection Time   12/12/12  7:10 PM      Component Value Range Status Comment   Specimen Description URINE, RANDOM   Final    Special Requests Normal   Final    Culture  Setup Time 12/13/2012 06:31   Final    Colony Count NO  GROWTH   Final    Culture NO GROWTH   Final    Report Status 12/14/2012 FINAL   Final   CULTURE, BLOOD (ROUTINE X 2)     Status: Normal (Preliminary result)   Collection Time   12/12/12 10:37 PM      Component Value Range Status Comment   Specimen Description BLOOD LEFT HAND   Final    Special Requests BOTTLES DRAWN AEROBIC AND ANAEROBIC 5CC   Final    Culture  Setup Time 12/13/2012 06:16   Final    Culture     Final     Value:        BLOOD CULTURE RECEIVED NO GROWTH TO DATE CULTURE WILL BE HELD FOR 5 DAYS BEFORE ISSUING A FINAL NEGATIVE REPORT   Report Status PENDING   Incomplete   CULTURE, BLOOD (ROUTINE X 2)     Status: Normal (Preliminary result)   Collection Time   12/12/12 10:43 PM      Component Value Range Status Comment   Specimen Description BLOOD RIGHT ANTECUBITAL   Final    Special Requests BOTTLES DRAWN AEROBIC AND ANAEROBIC 5CC   Final    Culture  Setup Time 12/13/2012 06:16   Final    Culture     Final    Value:        BLOOD CULTURE RECEIVED NO GROWTH TO DATE CULTURE WILL BE HELD FOR 5 DAYS BEFORE ISSUING A FINAL NEGATIVE REPORT   Report Status PENDING   Incomplete   MRSA PCR SCREENING     Status: Normal   Collection Time   12/13/12  1:29 PM      Component Value Range Status Comment   MRSA by PCR NEGATIVE  NEGATIVE Final   CULTURE, ROUTINE-ABSCESS     Status: Normal   Collection Time   12/13/12  5:12 PM      Component Value Range Status Comment   Specimen Description DRAINAGE   Final    Special Requests Normal   Final    Gram Stain     Final    Value: NO WBC SEEN     NO ORGANISMS SEEN   Culture NO GROWTH 3 DAYS   Final    Report Status 12/17/2012 FINAL   Final     Studies/Results: No results found.  Medications: Scheduled Meds:    . antiseptic oral rinse  15 mL Mouth Rinse q12n4p  . benazepril  10 mg Oral Daily  . chlorhexidine  15 mL Mouth Rinse BID  . ciprofloxacin  250 mg Oral BID  . donepezil  10 mg Oral QHS  . insulin aspart  0-9 Units Subcutaneous TID WC  . metroNIDAZOLE  250 mg Oral Q8H  . Tamsulosin HCl  0.4 mg Oral Daily  . traMADol  50 mg Oral BID   Continuous Infusions:   PRN Meds:.acetaminophen, acetaminophen, acetaminophen, albuterol, guaiFENesin-dextromethorphan, HYDROmorphone (DILAUDID) injection, phenol  Assessment/Plan:  1. Sepsis: due to cholecystitis, improving empiric abx, DC Vanc, continue Zosyn, cultures remain negative changed to PO cipro/flagyl  1/18 Fu blood and drain Cx negative so far Lactic acid and procalcitonin normal Urine output improved, stop IVF  2. Acute cholecystitis, acalculous S/p Perc drain per IR 1/15 Continue mgt per CCS and IR Empiric  Abx- IV Vanc/Zosyn, change to PO cipro/flagyl 1/18 for 5 more days advance diet as tolerated to bland diet  3. Large L rectus sheath hematoma Unclear when this developed, wife recalls some trauma to abd with a safety  belt tugging on his abd over a week ago when he was on coumadin, his INR improved,  received vitamin K 1/15, coumadin is still on hold since lower GI bleed and admission last week. Hb trended down slightly with IVF suspect from hemodilution, continue to monitor Monitor CBC   4. H/o DVT/PE in 05/2011 Anticoagulation on hold since lower GI bleed last week, continue to hold this Stopped anticoagulation  5. Dementia: stable  6. BPH continue flomax, monitor hematuria  Code status:FULL code: discussed this with wife again, they want everything done in an emergency Family communication: discussed with wife at bedside  PT , ambulate CIR consult   Time spent coordinating care:   LOS: 5 days   [facility] Pager: [phone] 12/17/2012, 9:48 AM

## 2012-12-17 NOTE — Progress Notes (Signed)
Rehab Admissions Coordinator Note:  Patient was screened by Clois Dupes for appropriateness for an Inpatient Acute Rehab Consult.  [employer] will not approve an inpt rehab admission for this diagnosis. At this time, we are recommending Skilled Nursing Facility.  Clois Dupes 12/17/2012, 12:43 PM  I can be reached at [phone].

## 2012-12-17 NOTE — Progress Notes (Signed)
Patient ID: Samuel Ryan, male   DOB: 04/24/1931, 79 y.o.   MRN: [id number]    Subjective: Continues to feel better, denies pain/n/v.  Tolerating his diet.  Complains of L knee pain and cough  Objective: Vital signs in last 24 hours: Temp:  [97.9 F (36.6 C)-99.8 F (37.7 C)] 97.9 F (36.6 C) (01/19 0600) Pulse Rate:  [80-90] 80  (01/19 0600) Resp:  [18-20] 20  (01/19 0600) BP: (148-163)/(71-85) 148/79 mmHg (01/19 0600) SpO2:  [94 %-97 %] 94 % (01/19 0600) Last BM Date: 12/16/12  Intake/Output from previous day: 01/18 0701 - 01/19 0700 In: 1728.3 [P.O.:480; I.V.:1248.3] Out: 2965 [Urine:2800; Drains:165] Intake/Output this shift:   General appearance: alert, cooperative and no distress Resp: clear to auscultation bilaterally GI: soft, nontender, drain with bilious output  Ext: L knee without erythema or edema  Lab Results:   [facility] 12/17/12 0432 12/16/12 0455  WBC 5.8 5.6  HGB 8.8* 7.9*  HCT 26.9* 24.5*  PLT 379 411*    BMET  Basename 12/17/12 0432 12/16/12 0455  NA 135 139  K 3.9 4.1  CL 106 110  CO2 18* 21  GLUCOSE 134* 114*  BUN 11 18  CREATININE 1.13 1.38*  CALCIUM 8.4 8.2*   PT/INR  Basename 12/17/12 0432 12/16/12 0455  LABPROT 16.8* 16.2*  INR 1.40 1.33     Lab 12/16/12 0455 12/14/12 0325 12/13/12 0225 12/12/12 1618  AST 14 11 15 15   ALT 9 9 11 11   ALKPHOS 65 65 74 82  BILITOT 1.0 1.8* 2.9* 2.0*  PROT 6.1 6.1 7.2 7.7  ALBUMIN 2.1* 2.1* 2.6* 2.8*     Lipase  No results found for this basename: lipase     Studies/Results: No results found.  Medications:    . antiseptic oral rinse  15 mL Mouth Rinse q12n4p  . benazepril  10 mg Oral Daily  . chlorhexidine  15 mL Mouth Rinse BID  . ciprofloxacin  250 mg Oral BID  . donepezil  10 mg Oral QHS  . insulin aspart  0-9 Units Subcutaneous TID WC  . metroNIDAZOLE  250 mg Oral Q8H  . Tamsulosin HCl  0.4 mg Oral Daily  . traMADol  50 mg Oral BID    Assessment/Plan 1. Abdominal pain with  probable a calculus cholecystitis, possible sepsis: s/p perc drain, WBC normalized, doing better  --PO antibiotics  --Advance diet to low fat diet as tolerated  --no other changes from surgical standpoint, will need drain for 6 weeks.  Ok to d/c when medically stable.  F/U with Dr Michaell Cowing as an outpatient.  Will sign off.  Please call with any questions or concerns. 2. Knee pain and cough- will defer to primary MD's     LOS: 5 days    Vanisha Whiten C. 12/17/2012

## 2012-12-18 LAB — BASIC METABOLIC PANEL
CO2: 20 mEq/L (ref 19–32)
Calcium: 8.1 mg/dL — ABNORMAL LOW (ref 8.4–10.5)
Creatinine, Ser: 1.11 mg/dL (ref 0.50–1.35)
Glucose, Bld: 132 mg/dL — ABNORMAL HIGH (ref 70–99)

## 2012-12-18 LAB — GLUCOSE, CAPILLARY
Glucose-Capillary: 124 mg/dL — ABNORMAL HIGH (ref 70–99)
Glucose-Capillary: 116 mg/dL — ABNORMAL HIGH (ref 70–99)

## 2012-12-18 MED ORDER — SACCHAROMYCES BOULARDII 250 MG PO CAPS
250.0000 mg | ORAL_CAPSULE | Freq: Two times a day (BID) | ORAL | Status: DC
  Administered 2012-12-18 – 2012-12-19 (×3): 250 mg via ORAL
  Filled 2012-12-18 (×4): qty 1

## 2012-12-18 NOTE — Progress Notes (Addendum)
[facility]             Progress Note   Subjective: Feels ok, no complaints, eager to advance diet  Objective: Vital signs in last 24 hours: Temp:  [98 F (36.7 C)-98.2 F (36.8 C)] 98 F (36.7 C) (01/20 0534) Pulse Rate:  [74-83] 80  (01/20 0534) Resp:  [18] 18  (01/20 0534) BP: (128-152)/(65-73) 131/65 mmHg (01/20 0534) SpO2:  [95 %-97 %] 97 % (01/20 0534) Weight change:  Last BM Date: 12/16/12  Intake/Output from previous day: 01/19 0701 - 01/20 0700 In: 680 [P.O.:680] Out: 1165 [Urine:1075; Drains:90] Total I/O In: 140 [P.O.:140] Out: 250 [Urine:200; Drains:50]   Physical Exam: General: awake, alert, oriented to place and partly to time, in no acute distress. HEENT: No bruits, no goiter. Heart: Regular rate and rhythm, without murmurs, rubs, gallops. Lungs: decreased BS at bases Abdomen: Soft, distended, RUQ non tender, no rigidity/rebound, echymoses in L lateral abd wall, positive bowel sounds, drain-perc chole Extremities: No clubbing cyanosis or edema with positive pedal pulses. Neuro: Grossly intact, nonfocal.    Lab Results: Basic Metabolic Panel:  Basename 12/18/12 0405 12/17/12 0432  NA 136 135  K 3.9 3.9  CL 106 106  CO2 20 18*  GLUCOSE 132* 134*  BUN 10 11  CREATININE 1.11 1.13  CALCIUM 8.1* 8.4  MG -- --  PHOS -- --   Liver Function Tests:  Basename 12/16/12 0455  AST 14  ALT 9  ALKPHOS 65  BILITOT 1.0  PROT 6.1  ALBUMIN 2.1*   No results found for this basename: LIPASE:2,AMYLASE:2 in the last 72 hours No results found for this basename: AMMONIA:2 in the last 72 hours CBC:  Basename 12/17/12 0432 12/16/12 0455  WBC 5.8 5.6  NEUTROABS -- --  HGB 8.8* 7.9*  HCT 26.9* 24.5*  MCV 88.8 90.1  PLT 379 411*   Cardiac Enzymes: No results found for this basename: CKTOTAL:3,CKMB:3,CKMBINDEX:3,TROPONINI:3 in the last 72 hours BNP: No results found for this basename: PROBNP:3 in the last 72 hours D-Dimer: No results  found for this basename: DDIMER:2 in the last 72 hours CBG:  Basename 12/18/12 0747 12/17/12 2131 12/17/12 1703 12/17/12 1157 12/17/12 0757 12/16/12 2210  GLUCAP 127* 133* 135* 153* 125* 122*   Hemoglobin A1C: No results found for this basename: HGBA1C in the last 72 hours Fasting Lipid Panel: No results found for this basename: CHOL,HDL,LDLCALC,TRIG,CHOLHDL,LDLDIRECT in the last 72 hours Thyroid Function Tests: No results found for this basename: TSH,T4TOTAL,FREET4,T3FREE,THYROIDAB in the last 72 hours Anemia Panel: No results found for this basename: VITAMINB12,FOLATE,FERRITIN,TIBC,IRON,RETICCTPCT in the last 72 hours Coagulation:  Basename 12/18/12 0405 12/17/12 0432  LABPROT 17.1* 16.8*  INR 1.43 1.40   Urine Drug Screen: Drugs of Abuse  No results found for this basename: labopia,  cocainscrnur,  labbenz,  amphetmu,  thcu,  labbarb    Alcohol Level: No results found for this basename: ETH:2 in the last 72 hours Urinalysis: No results found for this basename: COLORURINE:2,APPERANCEUR:2,LABSPEC:2,PHURINE:2,GLUCOSEU:2,HGBUR:2,BILIRUBINUR:2,KETONESUR:2,PROTEINUR:2,UROBILINOGEN:2,NITRITE:2,LEUKOCYTESUR:2 in the last 72 hours  Recent Results (from the past 240 hour(s))  URINE CULTURE     Status: Normal   Collection Time   12/12/12  7:10 PM      Component Value Range Status Comment   Specimen Description URINE, RANDOM   Final    Special Requests Normal   Final    Culture  Setup Time 12/13/2012 06:31   Final    Colony Count NO GROWTH   Final    Culture NO  GROWTH   Final    Report Status 12/14/2012 FINAL   Final   CULTURE, BLOOD (ROUTINE X 2)     Status: Normal (Preliminary result)   Collection Time   12/12/12 10:37 PM      Component Value Range Status Comment   Specimen Description BLOOD LEFT HAND   Final    Special Requests BOTTLES DRAWN AEROBIC AND ANAEROBIC 5CC   Final    Culture  Setup Time 12/13/2012 06:16   Final    Culture     Final    Value:        BLOOD CULTURE  RECEIVED NO GROWTH TO DATE CULTURE WILL BE HELD FOR 5 DAYS BEFORE ISSUING A FINAL NEGATIVE REPORT   Report Status PENDING   Incomplete   CULTURE, BLOOD (ROUTINE X 2)     Status: Normal (Preliminary result)   Collection Time   12/12/12 10:43 PM      Component Value Range Status Comment   Specimen Description BLOOD RIGHT ANTECUBITAL   Final    Special Requests BOTTLES DRAWN AEROBIC AND ANAEROBIC 5CC   Final    Culture  Setup Time 12/13/2012 06:16   Final    Culture     Final    Value:        BLOOD CULTURE RECEIVED NO GROWTH TO DATE CULTURE WILL BE HELD FOR 5 DAYS BEFORE ISSUING A FINAL NEGATIVE REPORT   Report Status PENDING   Incomplete   MRSA PCR SCREENING     Status: Normal   Collection Time   12/13/12  1:29 PM      Component Value Range Status Comment   MRSA by PCR NEGATIVE  NEGATIVE Final   CULTURE, ROUTINE-ABSCESS     Status: Normal   Collection Time   12/13/12  5:12 PM      Component Value Range Status Comment   Specimen Description DRAINAGE   Final    Special Requests Normal   Final    Gram Stain     Final    Value: NO WBC SEEN     NO ORGANISMS SEEN   Culture NO GROWTH 3 DAYS   Final    Report Status 12/17/2012 FINAL   Final     Studies/Results: No results found.  Medications: Scheduled Meds:    . antiseptic oral rinse  15 mL Mouth Rinse q12n4p  . benazepril  10 mg Oral Daily  . chlorhexidine  15 mL Mouth Rinse BID  . ciprofloxacin  250 mg Oral BID  . donepezil  10 mg Oral QHS  . insulin aspart  0-9 Units Subcutaneous TID WC  . metroNIDAZOLE  250 mg Oral Q8H  . Tamsulosin HCl  0.4 mg Oral Daily  . traMADol  50 mg Oral BID   Continuous Infusions:   PRN Meds:.acetaminophen, acetaminophen, acetaminophen, albuterol, guaiFENesin-dextromethorphan, HYDROmorphone (DILAUDID) injection, phenol  Assessment/Plan:  1. Sepsis: due to cholecystitis, improving empiric abx, DC Vanc, continue Zosyn, cultures remain negative changed to PO cipro/flagyl 1/18, continue abx for 4  more days Fu blood and drain Cx negative so far Lactic acid and procalcitonin normal  2. Acute cholecystitis, acalculous S/p Perc drain per IR 1/15, continue this for 6 weeks Continue mgt per CCS and IR Empiric  Abx- IV Vanc/Zosyn, changed to PO cipro/flagyl 1/18 for 4 more days advance diet as tolerated to bland diet FU with CCS at discharge  3. Large L rectus sheath hematoma Unclear when this developed, wife recalls some trauma to abd with  a safety belt tugging on his abd over a week ago when he was on coumadin, his INR improved,  received vitamin K 1/15, coumadin is still on hold since lower GI bleed and admission last week. Hb trended down slightly with IVF suspect from hemodilution, continue to monitor Monitor CBC   4. H/o DVT/PE in 05/2011 Anticoagulation on hold since lower GI bleed last week, continue to hold this Stopped anticoagulation  5. Dementia: stable  6. BPH continue flomax, monitor hematuria  Code status:FULL code: discussed this with wife again, they want everything done in an emergency Family communication: discussed with wife at bedside  PT , ambulate SNF for rehabilitation, medically stable for discharge CSW consult  Time spent coordinating care:   LOS: 6 days   [facility] Pager: [phone] 12/18/2012, 10:49 AM

## 2012-12-18 NOTE — Care Management Note (Signed)
    Page 1 of 2   12/18/2012     10:38:31 AM   CARE MANAGEMENT NOTE 12/18/2012  Patient:  Samuel Ryan, Samuel Ryan   Account Number:  [id number]  Date Initiated:  12/14/2012  Documentation initiated by:  DAVIS,RHONDA  Subjective/Objective Assessment:   pt with obstruction of bile stent and duct with poss stone and poss sepsis     Action/Plan:   lives at home   Anticipated DC Date:  12/19/2012   Anticipated DC Plan:  SKILLED NURSING FACILITY  In-house referral  Clinical Social Worker      DC Planning Services  CM consult      [facility] Choice  NA   Choice offered to / List presented to:  NA   DME arranged  NA      DME agency  NA     HH arranged  NA      HH agency  NA   Status of service:  Completed, signed off Medicare Important Message given?  NA - LOS <3 / Initial given by admissions (If response is "NO", the following Medicare IM given date fields will be blank) Date Medicare IM given:   Date Additional Medicare IM given:    Discharge Disposition:  SKILLED NURSING FACILITY  Per UR Regulation:  Reviewed for med. necessity/level of care/duration of stay  If discussed at Long Length of Stay Meetings, dates discussed:    Comments:  12-18-12 Lorenda Ishihara RN CM Not eligible for CIR, plan for SNF at d/c  [id number] Earlene Plater, RN, BSN, CCM:  CHART REVIEWED AND UPDATED.  Next chart review due on [id number]. NO DISCHARGE NEEDS PRESENT AT THIS TIME. CASE MANAGEMENT [phone]

## 2012-12-18 NOTE — Progress Notes (Signed)
Clinical Social Work Department BRIEF PSYCHOSOCIAL ASSESSMENT 12/18/2012  Patient:  Samuel Ryan, Samuel Ryan     Account Number:  [id number]     Admit date:  12/12/2012  Clinical Social Worker:  Candie Chroman  Date/Time:  12/18/2012 02:50 PM  Referred by:  Physician  Date Referred:  12/18/2012 Referred for  SNF Placement   Other Referral:   Interview type:  Patient Other interview type:    PSYCHOSOCIAL DATA Living Status:  WIFE Admitted from facility:   Level of care:   Primary support name:  Valma Cava Primary support relationship to patient:  SPOUSE Degree of support available:   unclear    CURRENT CONCERNS Current Concerns  Post-Acute Placement   Other Concerns:    SOCIAL WORK ASSESSMENT / PLAN Pt is an 79 yr old gentleman living at home prior to hospitalization. CSW met with pt/daughter to assist with d/c planning. Pt was hoping to have rehab at [facility] but was declined due to insurance coverage. Pt is willing to accept ST SNF placement. SNF search has been initiated and bed offers will be provided as received.   Assessment/plan status:  Psychosocial Support/Ongoing Assessment of Needs Other assessment/ plan:   Information/referral to community resources:   SNF list provided.    PATIENT'S/FAMILY'S RESPONSE TO PLAN OF CARE: Pt is interested in the [place] for [facility].   Cori Razor LCSW [phone]

## 2012-12-18 NOTE — Progress Notes (Signed)
Clinical Social Work Department CLINICAL SOCIAL WORK PLACEMENT NOTE 12/18/2012  Patient:  Samuel Ryan, Samuel Ryan  Account Number:  [id number] Admit date:  12/12/2012  Clinical Social Worker:  Cori Razor, LCSW  Date/time:  12/18/2012 02:56 PM  Clinical Social Work is seeking post-discharge placement for this patient at the following level of care:   SKILLED NURSING   (*CSW will update this form in Epic as items are completed)   12/18/2012  Patient/family provided with [facility] Department of Clinical Social Work's list of facilities offering this level of care within the geographic area requested by the patient (or if unable, by the patient's family).  12/18/2012  Patient/family informed of their freedom to choose among providers that offer the needed level of care, that participate in Medicare, Medicaid or managed care program needed by the patient, have an available bed and are willing to accept the patient.    Patient/family informed of [facility]' ownership interest in [facility], as well as of the fact that they are under no obligation to receive care at this facility.  PASARR submitted to EDS on  PASARR number received from EDS on 04/27/2011  FL2 transmitted to all facilities in geographic area requested by pt/family on  12/18/2012 FL2 transmitted to all facilities within larger geographic area on   Patient informed that his/her managed care company has contracts with or will negotiate with  certain facilities, including the following:     Patient/family informed of bed offers received:   Patient chooses bed at  Physician recommends and patient chooses bed at    Patient to be transferred to  on   Patient to be transferred to facility by   The following physician request were entered in Epic:   Additional Comments:  Cori Razor LCSW [phone]

## 2012-12-19 ENCOUNTER — Inpatient Hospital Stay
Admission: RE | Admit: 2012-12-19 | Discharge: 2013-02-06 | Disposition: A | Discharge status: Home / Self Care | Payer: Medicare Other | Source: Ambulatory Visit | Attending: Internal Medicine | Admitting: Internal Medicine

## 2012-12-19 DIAGNOSIS — Z9049 Acquired absence of other specified parts of digestive tract: Secondary | ICD-10-CM

## 2012-12-19 DIAGNOSIS — Z434 Encounter for attention to other artificial openings of digestive tract: Principal | ICD-10-CM

## 2012-12-19 LAB — CULTURE, BLOOD (ROUTINE X 2): Culture: NO GROWTH

## 2012-12-19 LAB — PROTIME-INR
INR: 1.41 (ref 0.00–1.49)
Prothrombin Time: 16.9 seconds — ABNORMAL HIGH (ref 11.6–15.2)

## 2012-12-19 MED ORDER — METRONIDAZOLE 250 MG PO TABS: 250.0000 mg | ORAL_TABLET | Freq: Three times a day (TID) | ORAL | Status: DC

## 2012-12-19 MED ORDER — CIPROFLOXACIN HCL 250 MG PO TABS: 250.0000 mg | ORAL_TABLET | Freq: Two times a day (BID) | ORAL | Status: DC

## 2012-12-19 NOTE — Progress Notes (Signed)
FL2 in chart for MD signature. Pt has SNF bed at [facility] today if stable for d/c. Family has an appt to complete admission papers this am at [facility]. CSW will follow to assist with d/c planning to SNF.  Cori Razor LCSW [phone]

## 2012-12-19 NOTE — Progress Notes (Signed)
Clinical Social Work Department CLINICAL SOCIAL WORK PLACEMENT NOTE 12/19/2012  Patient:  Samuel Ryan, Samuel Ryan  Account Number:  [id number] Admit date:  12/12/2012  Clinical Social Worker:  Cori Razor, LCSW  Date/time:  12/18/2012 02:56 PM  Clinical Social Work is seeking post-discharge placement for this patient at the following level of care:   SKILLED NURSING   (*CSW will update this form in Epic as items are completed)   12/18/2012  Patient/family provided with [facility] Department of Clinical Social Work's list of facilities offering this level of care within the geographic area requested by the patient (or if unable, by the patient's family).  12/18/2012  Patient/family informed of their freedom to choose among providers that offer the needed level of care, that participate in Medicare, Medicaid or managed care program needed by the patient, have an available bed and are willing to accept the patient.    Patient/family informed of [facility]' ownership interest in [facility], as well as of the fact that they are under no obligation to receive care at this facility.  PASARR submitted to EDS on  PASARR number received from EDS on 04/27/2011  FL2 transmitted to all facilities in geographic area requested by pt/family on  12/18/2012 FL2 transmitted to all facilities within larger geographic area on   Patient informed that his/her managed care company has contracts with or will negotiate with  certain facilities, including the following:     Patient/family informed of bed offers received:  12/19/2012 Patient chooses bed at [facility] Physician recommends and patient chooses bed at    Patient to be transferred to [facility] on  12/19/2012 Patient to be transferred to facility by [facility]  The following physician request were entered in Epic:   Additional Comments:  Cori Razor LCSW [phone]

## 2012-12-19 NOTE — Progress Notes (Signed)
Report called and given to [facility] B Rn at [employer], patient is table, will remove condom cath before discharge Stanford Breed RN 12-19-2012

## 2012-12-19 NOTE — Progress Notes (Signed)
Physical Therapy Treatment Patient Details Name: Samuel Ryan MRN: [id number] DOB: Jul 25, 1931 Today's Date: 12/19/2012 Time: 7829-5621 PT Time Calculation (min): 13 min  PT Assessment / Plan / Recommendation Comments on Treatment Session  Pt. appears improved in overall bed mobility and able to take several steps today. Plans for SNF today.    Follow Up Recommendations  SNF     Does the patient have the potential to tolerate intense rehabilitation     Barriers to Discharge        Equipment Recommendations  None recommended by PT    Recommendations for Other Services    Frequency Min 3X/week   Plan Discharge plan remains appropriate;Frequency remains appropriate    Precautions / Restrictions Precautions Precautions: Fall Precaution Comments: percutaneous cholesystostomy drain on R        Mobility  Bed Mobility Bed Mobility: Supine to Sit Supine to Sit: 1: +2 Total assist Supine to Sit: Patient Percentage: 50% Sitting - Scoot to Edge of Bed: 1: +2 Total assist Sitting - Scoot to Edge of Bed: Patient Percentage: 50% Details for Bed Mobility Assistance: patient with posterior bias and needed assist to come forward and maintain anterior weight shift  Transfers Sit to Stand: 1: +2 Total assist;From bed;From elevated surface Sit to Stand: Patient Percentage: 60% (x 2 trials) Stand to Sit: 1: +2 Total assist;To chair/3-in-1;With upper extremity assist Stand to Sit: Patient Percentage: 50% Details for Transfer Assistance: cues for upright posture and to reach to armrests. Pt. has decreased cpntrol of descent. Ambulation/Gait Ambulation/Gait Assistance: 1: +2 Total assist Ambulation Distance (Feet): 5 Feet Assistive device: Rolling walker Ambulation/Gait Assistance Details: cues for posture and for sequence. Gait Pattern: Step-to pattern;Decreased stride length;Lateral trunk lean to right    Exercises     PT Diagnosis:    PT Problem List:   PT Treatment Interventions:       PT Goals Acute Rehab PT Goals Pt will go Supine/Side to Sit: with min assist PT Goal: Supine/Side to Sit - Progress: Progressing toward goal Pt will Sit at [facility] of Bed: with supervision PT Goal: Sit at [facility] Of Bed - Progress: Progressing toward goal Pt will Ambulate: 51 - 150 feet;with rolling walker;with min assist PT Goal: Ambulate - Progress: Progressing toward goal  Visit Information  Last PT Received On: 12/19/12 Assistance Needed: +2    Subjective Data  Subjective: the ambulance is coming to pick me up   Cognition  Overall Cognitive Status: Impaired Arousal/Alertness: Awake/alert    Balance  Static Sitting Balance Static Sitting - Balance Support: Bilateral upper extremity supported;Feet supported Static Sitting - Level of Assistance: 4: Min assist Static Sitting - Comment/# of Minutes: varied with control and posteriorly lean, able to self correct with cues.  End of Session PT - End of Session Activity Tolerance: Patient tolerated treatment well Patient left: in chair;with call bell/phone within reach Nurse Communication: Mobility status   GP     Rada Hay 12/19/2012, 11:46 AM [phone]

## 2012-12-19 NOTE — Discharge Summary (Signed)
Physician Discharge Summary  Patient ID: Samuel Ryan MRN: [id number] DOB/AGE: 1931/05/26 79 y.o.  Admit date: 12/12/2012 Discharge date: 12/19/2012  Primary Care Physician:  Syliva Overman, MD   Discharge Diagnoses:    Principal Problem:  *Acute cholecystitis s/p percutaneous cholecystostomy drain Active Problems:  BENIGN PROSTATIC HYPERTROPHY, HX OF  h/o Pulmonary embolus/DVT in 7/12  Large Nontraumatic Left rectus hematoma   Dementia  BPH      Medication List     As of 12/19/2012 11:50 AM    STOP taking these medications         warfarin 5 MG tablet   Commonly known as: COUMADIN      TAKE these medications         acetaminophen 325 MG tablet   Commonly known as: TYLENOL   Take 650 mg by mouth every 6 (six) hours as needed. Pain.      albuterol 108 (90 BASE) MCG/ACT inhaler   Commonly known as: PROVENTIL HFA;VENTOLIN HFA   Inhale 1-2 puffs into the lungs every 6 (six) hours as needed for wheezing.      benazepril 10 MG tablet   Commonly known as: LOTENSIN   Take 1 tablet (10 mg total) by mouth daily.      ciprofloxacin 250 MG tablet   Commonly known as: CIPRO   Take 1 tablet (250 mg total) by mouth 2 (two) times daily. For 3 days      donepezil 10 MG tablet   Commonly known as: ARICEPT   Take 1 tablet (10 mg total) by mouth at bedtime.      lovastatin 40 MG tablet   Commonly known as: MEVACOR   Take 1 tablet (40 mg total) by mouth at bedtime.      metFORMIN 500 MG tablet   Commonly known as: GLUCOPHAGE   Take 1 tablet (500 mg total) by mouth 2 (two) times daily with a meal.      metroNIDAZOLE 250 MG tablet   Commonly known as: FLAGYL   Take 1 tablet (250 mg total) by mouth every 8 (eight) hours. For 3 days      saxagliptin HCl 5 MG Tabs tablet   Commonly known as: ONGLYZA   Take 1 tablet (5 mg total) by mouth daily.      silodosin 8 MG Caps capsule   Commonly known as: RAPAFLO   Take 1 capsule (8 mg total) by mouth daily with breakfast.      traMADol 50 MG tablet   Commonly known as: ULTRAM   Take 1 tablet (50 mg total) by mouth 2 (two) times daily.         Disposition and Follow-up:  PCP in 1 week Interventional radiology in 6 weeks for drain removal   Consults:  1. Dr.Gross, [facility] [place] [facility] 2. IR, Dr.John Watts   Significant Diagnostic Studies:  No results found.  Brief H and P:Samuel Ryan is a 79 y.o. year old male with baseline hx/o dementia, DM, recurrent VTE and recent admission for GI bleed while on coumadin who presents today with weakness, generalized fatigue and fever.  Pt lives at home with wife. Baseline hx/o dementia. Was in otherwise normal state of health yesterday. Wife states that pt was significantly weaker today as well as with decreased appetite. Pt was seen in ER for weakness where pt was noted to have been febrile to 102 as well as having progressive worsening weakness. Wife denies any complaints of SOB, chest pain, diarrhea. Wife  denies any confusion. Pt was noted have been recently hospitalized 1/10-1/12 for lower GI bleed thought to be secondary to supratherapeutic INR. Pt's coumadin was held since this point.      Hospital Course:  1. Sepsis: due to cholecystitis, improved  empiric abx, Was initially treated with IV Vanc/Zosyn then transitioned to PO cipro/flagyl on 1/18, continue abx for 3 more days to complete a 10day course Fu blood and drain Cx negative   Lactic acid and procalcitonin normal   2. Acute cholecystitis, acalculous  Was originally seen by Surgery and felt to be a poor surgical candidate, hence IR was consulted for cholecystostomy drain. S/p Perc cholecystostomy drain per IR on 1/15, continue this for 6 weeks  Empiric Abx- IV Vanc/Zosyn, changed to PO cipro/flagyl 1/18 for 3 more days  advanced diet  to bland diet  FU with IR in 6 weeks for drain removal   3. Large L rectus sheath hematoma  Unclear when this developed, wife recalls some trauma to abd with a  safety belt tugging on his abd over a week prior to admission when he was on coumadin, his INR improved, received vitamin K 1/15, coumadin is still on hold since lower GI bleed and admission last week.  Hb trended down slightly with IVF suspect from hemodilution, hemoglobin has remained stable in the mid 8 range since.   4. H/o DVT/PE in 05/2011  Anticoagulation on hold since his lower GI bleed last week, continue to hold coumadin now due to rectus sheath hematoma. I have stopped his anticoagulation due to this fact and he had also completed mandatory 6 months anticoagulation following his PE in 2012. If he develops DVT/PE in the future, IVC filter should be considered over anticoagulation.  5. Dementia: stable   6. BPH continue flomax  Code status:FULL code:   Discharge condition stable, discharge diet: bland carb modified for the next few days.  SNF for rehabilitation, medically stable for discharge    Time spent on Discharge:  Signed: Donnice Nielsen [facility]  12/19/2012, 11:50 AM

## 2012-12-20 ENCOUNTER — Telehealth (INDEPENDENT_AMBULATORY_CARE_PROVIDER_SITE_OTHER): Payer: Self-pay | Admitting: *Deleted

## 2012-12-20 LAB — GLUCOSE, CAPILLARY
Glucose-Capillary: 94 mg/dL (ref 70–99)
Glucose-Capillary: 111 mg/dL — ABNORMAL HIGH (ref 70–99)
Glucose-Capillary: 99 mg/dL (ref 70–99)

## 2012-12-20 NOTE — Telephone Encounter (Signed)
Patient had screening colonoscopy in October 2013. Please make an appointment for office visit.

## 2012-12-20 NOTE — Telephone Encounter (Signed)
Dr. Karilyn Cota - FYI  Per Dr. Jae Dire message on 12/10/12 - patient needing apt for GI Bleed  Patient's wife, Samuel Ryan, call and said Samuel Ryan just got out of the hospital on 12/19/12. He is now at [facility] and she will call back once the apt can be made.

## 2012-12-21 ENCOUNTER — Encounter: Payer: Medicare Other | Admitting: *Deleted

## 2012-12-21 LAB — GLUCOSE, CAPILLARY
Glucose-Capillary: 103 mg/dL — ABNORMAL HIGH (ref 70–99)
Glucose-Capillary: 117 mg/dL — ABNORMAL HIGH (ref 70–99)

## 2012-12-21 NOTE — Telephone Encounter (Signed)
LM for patient's wife to return the call.

## 2012-12-22 NOTE — Telephone Encounter (Signed)
Per Mrs. Moishy, Laday is very weak and not able to walk at this time. She doesn't want him to leave the [facility]hab until has gets some of his strength back.

## 2012-12-23 LAB — GLUCOSE, CAPILLARY
Glucose-Capillary: 113 mg/dL — ABNORMAL HIGH (ref 70–99)
Glucose-Capillary: 99 mg/dL (ref 70–99)
Glucose-Capillary: 79 mg/dL (ref 70–99)

## 2012-12-24 LAB — GLUCOSE, CAPILLARY: Glucose-Capillary: 102 mg/dL — ABNORMAL HIGH (ref 70–99)

## 2012-12-27 ENCOUNTER — Telehealth (INDEPENDENT_AMBULATORY_CARE_PROVIDER_SITE_OTHER): Payer: Self-pay | Admitting: General Surgery

## 2012-12-27 LAB — GLUCOSE, CAPILLARY
Glucose-Capillary: 84 mg/dL (ref 70–99)
Glucose-Capillary: 101 mg/dL — ABNORMAL HIGH (ref 70–99)
Glucose-Capillary: 95 mg/dL (ref 70–99)

## 2012-12-27 NOTE — Telephone Encounter (Signed)
Agree with delaying his appointment for now. Please tell them to call me if he any GI issues and I will go and see him at [facility] center.

## 2012-12-27 NOTE — Telephone Encounter (Signed)
Samuel Ryan called to set up an appt with SG and was a little confused as to why so she then put me on hold and came back to say that she was sorry that she did not need an appt with SG and that she had already schd the appt that she needed with IR and that was it

## 2012-12-29 LAB — GLUCOSE, CAPILLARY
Glucose-Capillary: 111 mg/dL — ABNORMAL HIGH (ref 70–99)
Glucose-Capillary: 150 mg/dL — ABNORMAL HIGH (ref 70–99)

## 2013-01-01 LAB — GLUCOSE, CAPILLARY
Glucose-Capillary: 92 mg/dL (ref 70–99)
Glucose-Capillary: 96 mg/dL (ref 70–99)
Glucose-Capillary: 99 mg/dL (ref 70–99)

## 2013-01-03 LAB — GLUCOSE, CAPILLARY: Glucose-Capillary: 96 mg/dL (ref 70–99)

## 2013-01-04 LAB — GLUCOSE, CAPILLARY
Glucose-Capillary: 92 mg/dL (ref 70–99)
Glucose-Capillary: 98 mg/dL (ref 70–99)

## 2013-01-04 NOTE — Telephone Encounter (Signed)
Advised Samuel Ryan to call our office or the [facility] if Samuel Ryan has anymore GI issues and Dr. Karilyn Cota will go see him. Voices understood.

## 2013-01-06 LAB — GLUCOSE, CAPILLARY
Glucose-Capillary: 83 mg/dL (ref 70–99)
Glucose-Capillary: 97 mg/dL (ref 70–99)

## 2013-01-08 LAB — GLUCOSE, CAPILLARY
Glucose-Capillary: 93 mg/dL (ref 70–99)
Glucose-Capillary: 107 mg/dL — ABNORMAL HIGH (ref 70–99)

## 2013-01-09 LAB — GLUCOSE, CAPILLARY: Glucose-Capillary: 94 mg/dL (ref 70–99)

## 2013-01-10 LAB — GLUCOSE, CAPILLARY
Glucose-Capillary: 117 mg/dL — ABNORMAL HIGH (ref 70–99)
Glucose-Capillary: 84 mg/dL (ref 70–99)
Glucose-Capillary: 110 mg/dL — ABNORMAL HIGH (ref 70–99)

## 2013-01-13 LAB — GLUCOSE, CAPILLARY: Glucose-Capillary: 153 mg/dL — ABNORMAL HIGH (ref 70–99)

## 2013-01-14 LAB — GLUCOSE, CAPILLARY
Glucose-Capillary: 87 mg/dL (ref 70–99)
Glucose-Capillary: 85 mg/dL (ref 70–99)

## 2013-01-15 LAB — GLUCOSE, CAPILLARY: Glucose-Capillary: 96 mg/dL (ref 70–99)

## 2013-01-19 LAB — GLUCOSE, CAPILLARY: Glucose-Capillary: 91 mg/dL (ref 70–99)

## 2013-01-23 ENCOUNTER — Ambulatory Visit (HOSPITAL_COMMUNITY)
Admit: 2013-01-23 | Discharge: 2013-01-23 | Disposition: A | Discharge status: Home / Self Care | Payer: Medicare Other | Source: Ambulatory Visit | Attending: Internal Medicine | Admitting: Internal Medicine

## 2013-01-23 DIAGNOSIS — K8000 Calculus of gallbladder with acute cholecystitis without obstruction: Secondary | ICD-10-CM | POA: Insufficient documentation

## 2013-01-23 LAB — GLUCOSE, CAPILLARY: Glucose-Capillary: 93 mg/dL (ref 70–99)

## 2013-01-23 MED ORDER — IOHEXOL 300 MG/ML  SOLN
50.0000 mL | Freq: Once | INTRAMUSCULAR | Status: AC | PRN
  Administered 2013-01-23: 20 mL

## 2013-01-23 NOTE — Procedures (Signed)
Cholangiogram demonstrates several small gallstones within an otherwise normal appearing GB.  Cystic duct and CBD are patent.  No evidence of choledocholithiasis. Will attempt trial of capping the chole tube for at least 1 week to ensure physiologic drainage of GB prior to potential future chole tube removal.

## 2013-01-24 LAB — GLUCOSE, CAPILLARY: Glucose-Capillary: 92 mg/dL (ref 70–99)

## 2013-01-25 ENCOUNTER — Ambulatory Visit: Payer: Self-pay | Admitting: *Deleted

## 2013-01-25 DIAGNOSIS — I2699 Other pulmonary embolism without acute cor pulmonale: Secondary | ICD-10-CM

## 2013-01-25 DIAGNOSIS — Z7901 Long term (current) use of anticoagulants: Secondary | ICD-10-CM

## 2013-01-25 LAB — GLUCOSE, CAPILLARY

## 2013-01-26 LAB — GLUCOSE, CAPILLARY: Glucose-Capillary: 89 mg/dL (ref 70–99)

## 2013-01-31 LAB — GLUCOSE, CAPILLARY: Glucose-Capillary: 79 mg/dL (ref 70–99)

## 2013-02-02 LAB — GLUCOSE, CAPILLARY
Glucose-Capillary: 81 mg/dL (ref 70–99)
Glucose-Capillary: 115 mg/dL — ABNORMAL HIGH (ref 70–99)

## 2013-02-05 LAB — GLUCOSE, CAPILLARY: Glucose-Capillary: 79 mg/dL (ref 70–99)

## 2013-02-06 ENCOUNTER — Ambulatory Visit (HOSPITAL_COMMUNITY)
Admit: 2013-02-06 | Discharge: 2013-02-06 | Disposition: A | Discharge status: Home / Self Care | Payer: Medicare Other | Source: Ambulatory Visit | Attending: Internal Medicine | Admitting: Internal Medicine

## 2013-02-06 DIAGNOSIS — Z434 Encounter for attention to other artificial openings of digestive tract: Secondary | ICD-10-CM | POA: Insufficient documentation

## 2013-02-06 MED ORDER — IOHEXOL 300 MG/ML  SOLN
50.0000 mL | Freq: Once | INTRAMUSCULAR | Status: AC | PRN
  Administered 2013-02-06: 20 mL

## 2013-02-06 NOTE — Procedures (Signed)
Interventional Radiology Procedure Note  Procedure:  1.) Cholangiogram through existing tube shows patent cystic duct and CBD. 2.) Percutaneous cholecystostomy tube was removed.  Complications: None Recommendations: - Dressing changes BID and PRN until site has healed - If patient develops recurrent cholecystitis, will require replacement of the chole tube which will then be permanent.   Signed,  Sterling Big, MD Vascular & Interventional Radiologist [facility] Radiology

## 2013-02-12 ENCOUNTER — Telehealth: Payer: Self-pay | Admitting: Internal Medicine

## 2013-02-13 ENCOUNTER — Other Ambulatory Visit: Payer: Self-pay | Admitting: Family Medicine

## 2013-02-13 ENCOUNTER — Telehealth: Payer: Self-pay | Admitting: Family Medicine

## 2013-02-13 NOTE — Telephone Encounter (Signed)
Patient aware of message from next telephone message.

## 2013-02-13 NOTE — Telephone Encounter (Signed)
Pls let pt know based on lab from 03/10, he needs to stop metformin, if he is still taking this for his blood sugar, please also explain to his wife.  Let him know blood count has improved  Needs to ensure he drinks sufficient water daily  Remind him to bring all meds to next Ov, labs on that day, no need to fast for the labs , please he NEEDS to eat and drink

## 2013-02-13 NOTE — Telephone Encounter (Signed)
Patient aware.

## 2013-02-20 ENCOUNTER — Ambulatory Visit: Payer: Medicare Other | Admitting: Family Medicine

## 2013-02-22 ENCOUNTER — Encounter: Payer: Self-pay | Admitting: Family Medicine

## 2013-02-22 ENCOUNTER — Ambulatory Visit (INDEPENDENT_AMBULATORY_CARE_PROVIDER_SITE_OTHER): Payer: Medicare Other | Admitting: Family Medicine

## 2013-02-22 VITALS — BP 108/62 | HR 62 | Resp 16 | Ht 70.0 in | Wt 222.0 lb

## 2013-02-22 DIAGNOSIS — F068 Other specified mental disorders due to known physiological condition: Secondary | ICD-10-CM

## 2013-02-22 DIAGNOSIS — K810 Acute cholecystitis: Secondary | ICD-10-CM

## 2013-02-22 DIAGNOSIS — I10 Essential (primary) hypertension: Secondary | ICD-10-CM

## 2013-02-22 DIAGNOSIS — F3289 Other specified depressive episodes: Secondary | ICD-10-CM

## 2013-02-22 DIAGNOSIS — F329 Major depressive disorder, single episode, unspecified: Secondary | ICD-10-CM

## 2013-02-22 DIAGNOSIS — R5381 Other malaise: Secondary | ICD-10-CM

## 2013-02-22 DIAGNOSIS — E119 Type 2 diabetes mellitus without complications: Secondary | ICD-10-CM

## 2013-02-22 DIAGNOSIS — F0280 Dementia in other diseases classified elsewhere without behavioral disturbance: Secondary | ICD-10-CM

## 2013-02-22 DIAGNOSIS — I2699 Other pulmonary embolism without acute cor pulmonale: Secondary | ICD-10-CM

## 2013-02-22 DIAGNOSIS — E782 Mixed hyperlipidemia: Secondary | ICD-10-CM

## 2013-02-22 DIAGNOSIS — F0393 Unspecified dementia, unspecified severity, with mood disturbance: Secondary | ICD-10-CM

## 2013-02-22 DIAGNOSIS — R5383 Other fatigue: Secondary | ICD-10-CM

## 2013-02-22 NOTE — Patient Instructions (Addendum)
F/U in 4 month, please call if you need me before.  You are currently doing very well, conntinue medication as you are taking it  HBA1C, CBC , cmp and fasting lipid first week in May.  We will get onglyza for you, as able

## 2013-02-25 ENCOUNTER — Encounter: Payer: Self-pay | Admitting: Family Medicine

## 2013-02-25 DIAGNOSIS — F329 Major depressive disorder, single episode, unspecified: Secondary | ICD-10-CM | POA: Insufficient documentation

## 2013-02-25 DIAGNOSIS — F0393 Unspecified dementia, unspecified severity, with mood disturbance: Secondary | ICD-10-CM | POA: Insufficient documentation

## 2013-02-25 NOTE — Assessment & Plan Note (Signed)
Controlled, no change in medication Hyperlipidemia:Low fat diet discussed and encouraged.  \ 

## 2013-02-25 NOTE — Assessment & Plan Note (Signed)
Stable, not suicidal or homicidal, continue med

## 2013-02-25 NOTE — Assessment & Plan Note (Signed)
Controlled, no change in medication Patient advised to reduce carb and sweets,  take meds as prescribed, test blood as directed, and attempt to lose weight, to improve blood sugar control.

## 2013-02-25 NOTE — Progress Notes (Signed)
  Subjective:    Patient ID: Samuel Ryan, male    DOB: 1931/11/25, 79 y.o.   MRN: [id number]  HPI  Pt in for f/u from prolonged institutionalization following acute cholecystitis treated with management by  drainage tube. Hospitalized for 1 week, followed by approximate 2 month stay in a skilled nursing facility, home for approx 2 weeks, overall doing well. Denies fevr, pain, chills, reports good apetie and regular bowel movements, weak still but regaining srtength gradually, happy to e home Pt also had a large hematoma, his coumadin which he was on for a July PE, has now been discontinued   Review of Systems See HPI Denies recent fever or chills.c/o fatigue and generalized weakness from prolonged instituionalization Denies sinus pressure, nasal congestion, ear pain or sore throat. Denies chest congestion, productive cough or wheezing. Denies chest pains, palpitations and leg swelling Denies abdominal pain, nausea, vomiting,diarrhea or constipation.   Denies dysuria, frequency  Chronic joint pain  and limitation in mobility.Ambulates with assistance Denies headaches, seizures, numbness, or tingling. Denies depression, anxiety or insomnia.c/o memory loss Denies skin break down or rash.        Objective:   Physical Exam Patient alert and in no cardiopulmonary distress.  HEENT: No facial asymmetry, EOMI, no sinus tenderness,  oropharynx pink and moist.  Neck decreased ROM no adenopathy.  Chest: Clear to auscultation bilaterally.  CVS: S1, S2 no murmurs, no S3.  ABD: Soft non tender. Bowel sounds normal.  Ext: No edema  MS: decreased ROM spine, shoulders, hips and knees.  Skin: Intact, no ulcerations or rash noted.  Psych: Good eye contact, normal affect. Memory loss not anxious or depressed appearing.  CNS: CN 2-12 intact, power,  normal throughout.        Assessment & Plan:

## 2013-02-25 NOTE — Assessment & Plan Note (Signed)
Stable , continue aricept 

## 2013-02-25 NOTE — Assessment & Plan Note (Signed)
Recovered from acute event in January 2014,had drainage tube placed , site  Now entirely closed and healed

## 2013-02-25 NOTE — Assessment & Plan Note (Signed)
Pt has completed 6 month course of anticoagullation with the complication of a large hematoma, coumadin disconinued

## 2013-03-02 ENCOUNTER — Encounter: Payer: Self-pay | Admitting: Family Medicine

## 2013-03-07 ENCOUNTER — Ambulatory Visit (HOSPITAL_COMMUNITY): Payer: Medicare Other

## 2013-03-08 ENCOUNTER — Ambulatory Visit: Payer: Medicare Other | Admitting: Family Medicine

## 2013-03-13 ENCOUNTER — Other Ambulatory Visit: Payer: Self-pay

## 2013-03-13 MED ORDER — DONEPEZIL HCL 10 MG PO TABS: 10.0000 mg | ORAL_TABLET | Freq: Every day | ORAL | Status: DC

## 2013-03-13 MED ORDER — FERROUS SULFATE 325 (65 FE) MG PO TBEC: 325.0000 mg | DELAYED_RELEASE_TABLET | Freq: Two times a day (BID) | ORAL | Status: DC

## 2013-03-13 MED ORDER — SERTRALINE HCL 50 MG PO TABS: 50.0000 mg | ORAL_TABLET | Freq: Every day | ORAL | Status: DC

## 2013-03-13 MED ORDER — LOVASTATIN 40 MG PO TABS: 40.0000 mg | ORAL_TABLET | Freq: Every day | ORAL | Status: DC

## 2013-03-15 ENCOUNTER — Encounter: Payer: Self-pay | Admitting: Family Medicine

## 2013-03-15 ENCOUNTER — Ambulatory Visit (INDEPENDENT_AMBULATORY_CARE_PROVIDER_SITE_OTHER): Payer: Medicare Other | Admitting: Family Medicine

## 2013-03-15 VITALS — BP 124/78 | HR 82 | Resp 16 | Wt 224.1 lb

## 2013-03-15 DIAGNOSIS — L03319 Cellulitis of trunk, unspecified: Secondary | ICD-10-CM

## 2013-03-15 DIAGNOSIS — R198 Other specified symptoms and signs involving the digestive system and abdomen: Secondary | ICD-10-CM

## 2013-03-15 DIAGNOSIS — L02219 Cutaneous abscess of trunk, unspecified: Secondary | ICD-10-CM

## 2013-03-15 DIAGNOSIS — L03316 Cellulitis of umbilicus: Secondary | ICD-10-CM

## 2013-03-15 MED ORDER — SULFAMETHOXAZOLE-TRIMETHOPRIM 800-160 MG PO TABS: 1.0000 | ORAL_TABLET | Freq: Two times a day (BID) | ORAL | Status: AC

## 2013-03-15 NOTE — Progress Notes (Signed)
  Subjective:    Patient ID: Samuel Ryan, male    DOB: Dec 27, 1930, 79 y.o.   MRN: [id number]  HPI  Patient here due to redness in his bellybutton that he noticed yesterday he was also draining some pus. He no recent abdominal surgeries he did have cholecystitis which required a drain back in January of 2014. He denies any fever or abdominal pain.  Review of Systems  GEN- denies fatigue, fever, weight loss,weakness, recent illness HEENT- denies eye drainage, change in vision, nasal discharge, CVS- denies chest pain, palpitations RESP- denies SOB, cough, wheeze ABD- denies N/V, change in stools, abd pain Neuro- denies headache, dizziness, syncope, seizure activity      Objective:   Physical Exam GEN-NAD,alert and orientec x 3 CVS-RRR, no murmur RESP-CTAB ABD-NABS,soft,NT,ND Skin- umbilicaus extending 1cm outward- erythema with small amount of yellow pus, no fluctuant area palpated, no inducation       Assessment & Plan:

## 2013-03-15 NOTE — Patient Instructions (Signed)
Pick up a bar of dial soap or liquid soap- anti-bacterial Take antibiotics as prescribed Call if the redness gets worse or he gets fever or pus coming out of belly button F/U as previous

## 2013-03-18 DIAGNOSIS — L03316 Cellulitis of umbilicus: Secondary | ICD-10-CM | POA: Insufficient documentation

## 2013-03-18 LAB — WOUND CULTURE

## 2013-03-18 NOTE — Assessment & Plan Note (Signed)
He has definite cellulitis , small amount of pus but I can not appreciate any particular abscess unless very small at this time. Culture taken. Start DS bactrim. Dial soap. He looks overall well. F/u via phone, if not improvement image

## 2013-03-19 ENCOUNTER — Telehealth: Payer: Self-pay | Admitting: Family Medicine

## 2013-03-19 NOTE — Telephone Encounter (Signed)
Spoke with pt wife, umbilicus infection has cleared, has a few days of antibiotics left, no pus drainage

## 2013-04-13 ENCOUNTER — Other Ambulatory Visit: Payer: Self-pay

## 2013-04-13 DIAGNOSIS — M129 Arthropathy, unspecified: Secondary | ICD-10-CM

## 2013-04-13 MED ORDER — TRAMADOL HCL 50 MG PO TABS: 50.0000 mg | ORAL_TABLET | Freq: Two times a day (BID) | ORAL | Status: DC

## 2013-04-17 ENCOUNTER — Encounter: Payer: Self-pay | Admitting: Family Medicine

## 2013-04-17 ENCOUNTER — Ambulatory Visit (INDEPENDENT_AMBULATORY_CARE_PROVIDER_SITE_OTHER): Payer: Medicare Other | Admitting: Family Medicine

## 2013-04-17 VITALS — BP 128/62 | HR 60 | Resp 18 | Ht 70.0 in | Wt 230.0 lb

## 2013-04-17 DIAGNOSIS — R197 Diarrhea, unspecified: Secondary | ICD-10-CM | POA: Insufficient documentation

## 2013-04-17 NOTE — Progress Notes (Signed)
  Subjective:    Patient ID: Samuel Ryan, male    DOB: 06-16-1931, 79 y.o.   MRN: [id number]  HPI  Patient presents after 2 episodes of abdominal cramping followed by loose bowels. She states after he ate at [facility] on Saturday that evening he had abdominal cramping and an episode of diarrhea is stool was a little dark in nature he no longer takes any blood thinners but does take iron tablets twice a day. He had another episode yesterday although he ate pizza before hand on Saturday he was unable to make it to the restroom yesterday he made it to the rest room. He denies any abdominal pain nausea vomiting he's not had any other episodes besides the tube. He denies any fever. He denies any bright red blood  Review of Systems  GEN- denies fatigue, fever, weight loss,weakness, recent illness HEENT- denies eye drainage, change in vision, nasal discharge, CVS- denies chest pain, palpitations RESP- denies SOB, cough, wheeze ABD- denies N/V, +change in stools, abd pain GU- denies dysuria, hematuria, dribbling, incontinence MSK- denies joint pain, muscle aches, injury Neuro- denies headache, dizziness, syncope, seizure activity      Objective:   Physical Exam GEN- NAD, alert and oriented x3 HEENT- PERRL, EOMI, non injected sclera, pink conjunctiva, MMM, oropharynx clear Neck- Supple, no thryomegaly CVS- RRR, no murmur RESP-CTAB ABD-NABS,soft,NT,ND Rectum- normal tone, no external lesions, soft brown stool in vault, FOBT neg EXT- No edema Pulses- Radial 2+        Assessment & Plan:

## 2013-04-17 NOTE — Patient Instructions (Addendum)
Avoid fried foods You can stop the stool softeners You can take imodium as needed Call if anything changes, if you fever or abdominal pain  F/U Dr. Lodema Hong

## 2013-04-17 NOTE — Assessment & Plan Note (Addendum)
Fairly normal colonoscopy in 2013, diverticulosis , internal hemorrhoids noted Very few episodes of diarrhea currently, well appearing, no blood seen Dark appearance likley iron Avoid fast food, fatty foods Imodium as needed Call if any changes , would image as needed

## 2013-05-08 ENCOUNTER — Other Ambulatory Visit: Payer: Self-pay

## 2013-05-08 DIAGNOSIS — M129 Arthropathy, unspecified: Secondary | ICD-10-CM

## 2013-05-08 MED ORDER — TRAMADOL HCL 50 MG PO TABS: 50.0000 mg | ORAL_TABLET | Freq: Two times a day (BID) | ORAL | Status: DC

## 2013-06-19 ENCOUNTER — Encounter: Payer: Self-pay | Admitting: Family Medicine

## 2013-06-19 ENCOUNTER — Other Ambulatory Visit: Payer: Self-pay | Admitting: Family Medicine

## 2013-06-19 ENCOUNTER — Ambulatory Visit (INDEPENDENT_AMBULATORY_CARE_PROVIDER_SITE_OTHER): Payer: Medicare Other | Admitting: Family Medicine

## 2013-06-19 VITALS — BP 134/80 | HR 72 | Resp 16 | Ht 70.0 in | Wt 232.4 lb

## 2013-06-19 DIAGNOSIS — F409 Phobic anxiety disorder, unspecified: Secondary | ICD-10-CM

## 2013-06-19 DIAGNOSIS — M79671 Pain in right foot: Secondary | ICD-10-CM | POA: Insufficient documentation

## 2013-06-19 DIAGNOSIS — F068 Other specified mental disorders due to known physiological condition: Secondary | ICD-10-CM

## 2013-06-19 DIAGNOSIS — F5105 Insomnia due to other mental disorder: Secondary | ICD-10-CM

## 2013-06-19 DIAGNOSIS — M79609 Pain in unspecified limb: Secondary | ICD-10-CM

## 2013-06-19 DIAGNOSIS — E119 Type 2 diabetes mellitus without complications: Secondary | ICD-10-CM

## 2013-06-19 DIAGNOSIS — E782 Mixed hyperlipidemia: Secondary | ICD-10-CM

## 2013-06-19 DIAGNOSIS — F489 Nonpsychotic mental disorder, unspecified: Secondary | ICD-10-CM

## 2013-06-19 LAB — LIPID PANEL
Cholesterol: 155 mg/dL (ref 0–200)
HDL: 42 mg/dL (ref >39)
LDL Cholesterol: 94 mg/dL (ref 0–99)
Total CHOL/HDL Ratio: 3.7 Ratio
Triglycerides: 94 mg/dL (ref <150)
VLDL: 19 mg/dL (ref 0–40)

## 2013-06-19 LAB — COMPREHENSIVE METABOLIC PANEL
Alkaline Phosphatase: 71 U/L (ref 39–117)
BUN: 13 mg/dL (ref 6–23)
CO2: 27 mEq/L (ref 19–32)
Creat: 1.51 mg/dL — ABNORMAL HIGH (ref 0.50–1.35)
Glucose, Bld: 137 mg/dL — ABNORMAL HIGH (ref 70–99)
Sodium: 139 mEq/L (ref 135–145)
Total Bilirubin: 1 mg/dL (ref 0.3–1.2)

## 2013-06-19 LAB — CBC WITH DIFFERENTIAL/PLATELET
Basophils Relative: 0 % (ref 0–1)
Eosinophils Absolute: 0.1 10*3/uL (ref 0.0–0.7)
Eosinophils Relative: 1 % (ref 0–5)
Hemoglobin: 11.5 g/dL — ABNORMAL LOW (ref 13.0–17.0)
Lymphs Abs: 1.6 10*3/uL (ref 0.7–4.0)
MCH: 29.5 pg (ref 26.0–34.0)
MCHC: 33.3 g/dL (ref 30.0–36.0)
MCV: 88.5 fL (ref 78.0–100.0)
Monocytes Relative: 10 % (ref 3–12)
Neutrophils Relative %: 67 % (ref 43–77)
RBC: 3.9 MIL/uL — ABNORMAL LOW (ref 4.22–5.81)

## 2013-06-19 LAB — HEMOGLOBIN A1C: Hgb A1c MFr Bld: 7 % — ABNORMAL HIGH (ref <5.7)

## 2013-06-19 MED ORDER — RANITIDINE HCL 150 MG PO TABS: 150.0000 mg | ORAL_TABLET | Freq: Two times a day (BID) | ORAL | Status: DC

## 2013-06-19 MED ORDER — ASPIRIN EC 81 MG PO TBEC: 81.0000 mg | DELAYED_RELEASE_TABLET | Freq: Every day | ORAL | Status: AC

## 2013-06-19 MED ORDER — METHYLPREDNISOLONE ACETATE 40 MG/ML IJ SUSP
40.0000 mg | Freq: Once | INTRAMUSCULAR | Status: AC
  Administered 2013-06-19: 40 mg via INTRAMUSCULAR

## 2013-06-19 MED ORDER — KETOROLAC TROMETHAMINE 60 MG/2ML IJ SOLN
30.0000 mg | Freq: Once | INTRAMUSCULAR | Status: AC
  Administered 2013-06-19: 30 mg via INTRAMUSCULAR

## 2013-06-19 MED ORDER — INDOMETHACIN 25 MG PO CAPS: ORAL_CAPSULE | ORAL | Status: AC

## 2013-06-19 MED ORDER — TEMAZEPAM 7.5 MG PO CAPS: 7.5000 mg | ORAL_CAPSULE | Freq: Every evening | ORAL | Status: DC | PRN

## 2013-06-19 MED ORDER — PREDNISONE 5 MG PO TABS: 5.0000 mg | ORAL_TABLET | Freq: Two times a day (BID) | ORAL | Status: AC

## 2013-06-19 NOTE — Patient Instructions (Addendum)
F/u in 4 month, cancel sooner  You are getting toradol 30mg  Im and depo medrol 40mg  Im for right foot pain, also medication is sent in for 5 days.(indomethacin and prednisone) Please take zantac twice daily to protect your stomach   New is aspirin 81 mg daily to reduce risk of heart attack  New is medication , restoril at bedtime to help with sleep and anxiety   Please practice good sleep hygiene, you will get information on this   Keep appt with urologist, Dr Rockwell Germany today if able, HBA1C and chem 7 in 4 month

## 2013-06-20 LAB — MICROALBUMIN / CREATININE URINE RATIO
Creatinine, Urine: 136.2 mg/dL
Microalb Creat Ratio: 12.4 mg/g (ref 0.0–30.0)
Microalb, Ur: 1.69 mg/dL (ref 0.00–1.89)

## 2013-06-20 NOTE — Assessment & Plan Note (Signed)
Acute onset, no associated trauma, likely gout, will check uric acid level

## 2013-06-20 NOTE — Assessment & Plan Note (Signed)
Controlled, no change in medication Patient advised to reduce carb and sweets, commit to regular physical activity, take meds as prescribed, test blood as directed, and attempt to lose weight, to improve blood sugar control.  

## 2013-06-20 NOTE — Progress Notes (Signed)
  Subjective:    Patient ID: Samuel Ryan, male    DOB: 1931/09/13, 79 y.o.   MRN: [id number]  HPI  The PT is here for follow up and re-evaluation of chronic medical conditions, medication management and review of any available recent lab and radiology data.  Preventive health is updated, specifically  Cancer screening and Immunization.   He is reminded of th need to f/u with urology, his PSA is elevated, wife and son present and are aware The PT denies any adverse reactions to current medications since the last visit.  8 day h/o foot pain , progressive to extent tat he has been non weight bearing in the last 2 days, no trauma , has had gout in the past Reportedly agitated at night, wife and son request medication for this States he has been improving over time, has been back to going to [place] as well as visiting people in the nursing home    Review of Systems See HPI Denies recent fever or chills. Denies sinus pressure, nasal congestion, ear pain or sore throat. Denies chest congestion, productive cough or wheezing. Denies chest pains, palpitations and leg swelling Denies abdominal pain, nausea, vomiting,diarrhea or constipation.   Denies dysuria, frequency, hesitancy or incontinence. Chronic joint pain with limitation in mobility Denies headaches, seizures, numbness, or tingling. . Denies skin break down or rash.        Objective:   Physical Exam  Patient alert and oriented and in no cardiopulmonary distress.relies on walker for ambulation  HEENT: No facial asymmetry, EOMI, no sinus tenderness,  oropharynx pink and moist.  Neck adequate ROM,no adenopathy.  Chest: Clear to auscultation bilaterally.  CVS: S1, S2 no murmurs, no S3.  ABD: Soft non tender. Bowel sounds normal.  Ext: No edema  MS: decreased  ROM spine, shoulders, hips and knees.Tender over affected foot  Skin: Intact, no ulcerations or rash noted.  Psych: Good eye contact, normal affect. Memory impaired  not anxious or depressed appearing.  CNS: CN 2-12 intact, power,  normal throughout.       Assessment & Plan:

## 2013-06-20 NOTE — Assessment & Plan Note (Signed)
Sleep hygiene reviewed Pt to start restoril

## 2013-06-20 NOTE — Assessment & Plan Note (Signed)
Stable on current med , improved level of function, continue same

## 2013-06-20 NOTE — Assessment & Plan Note (Signed)
Controlled, no change in medication Hyperlipidemia:Low fat diet discussed and encouraged.  \ 

## 2013-06-26 ENCOUNTER — Ambulatory Visit: Payer: Medicare Other | Admitting: Family Medicine

## 2013-07-03 ENCOUNTER — Other Ambulatory Visit: Payer: Self-pay

## 2013-07-03 MED ORDER — SAXAGLIPTIN HCL 5 MG PO TABS: 5.0000 mg | ORAL_TABLET | Freq: Every day | ORAL | Status: DC

## 2013-09-06 ENCOUNTER — Other Ambulatory Visit: Payer: Self-pay

## 2013-09-06 MED ORDER — LOVASTATIN 40 MG PO TABS: 40.0000 mg | ORAL_TABLET | Freq: Every day | ORAL | Status: DC

## 2013-09-06 MED ORDER — SERTRALINE HCL 50 MG PO TABS: 50.0000 mg | ORAL_TABLET | Freq: Every day | ORAL | Status: DC

## 2013-09-06 MED ORDER — DONEPEZIL HCL 10 MG PO TABS: 10.0000 mg | ORAL_TABLET | Freq: Every day | ORAL | Status: DC

## 2013-09-24 LAB — HM DIABETES EYE EXAM

## 2013-10-08 ENCOUNTER — Other Ambulatory Visit: Payer: Self-pay

## 2013-10-08 DIAGNOSIS — M129 Arthropathy, unspecified: Secondary | ICD-10-CM

## 2013-10-08 MED ORDER — TRAMADOL HCL 50 MG PO TABS: 50.0000 mg | ORAL_TABLET | Freq: Two times a day (BID) | ORAL | Status: DC

## 2013-10-17 LAB — HEMOGLOBIN A1C
Hgb A1c MFr Bld: 7.9 % — ABNORMAL HIGH (ref <5.7)
Mean Plasma Glucose: 180 mg/dL — ABNORMAL HIGH (ref <117)

## 2013-10-17 LAB — BASIC METABOLIC PANEL
BUN: 10 mg/dL (ref 6–23)
Chloride: 103 mEq/L (ref 96–112)
Potassium: 4.6 mEq/L (ref 3.5–5.3)
Sodium: 139 mEq/L (ref 135–145)

## 2013-10-22 ENCOUNTER — Encounter: Payer: Self-pay | Admitting: Family Medicine

## 2013-10-22 ENCOUNTER — Ambulatory Visit (INDEPENDENT_AMBULATORY_CARE_PROVIDER_SITE_OTHER): Payer: Medicare Other | Admitting: Family Medicine

## 2013-10-22 ENCOUNTER — Encounter (INDEPENDENT_AMBULATORY_CARE_PROVIDER_SITE_OTHER): Payer: Self-pay

## 2013-10-22 VITALS — BP 134/80 | HR 74 | Resp 16 | Ht 70.0 in | Wt 250.0 lb

## 2013-10-22 DIAGNOSIS — F489 Nonpsychotic mental disorder, unspecified: Secondary | ICD-10-CM

## 2013-10-22 DIAGNOSIS — F329 Major depressive disorder, single episode, unspecified: Secondary | ICD-10-CM

## 2013-10-22 DIAGNOSIS — R05 Cough: Secondary | ICD-10-CM | POA: Insufficient documentation

## 2013-10-22 DIAGNOSIS — M129 Arthropathy, unspecified: Secondary | ICD-10-CM

## 2013-10-22 DIAGNOSIS — F409 Phobic anxiety disorder, unspecified: Secondary | ICD-10-CM

## 2013-10-22 DIAGNOSIS — E049 Nontoxic goiter, unspecified: Secondary | ICD-10-CM

## 2013-10-22 DIAGNOSIS — E782 Mixed hyperlipidemia: Secondary | ICD-10-CM

## 2013-10-22 DIAGNOSIS — I2699 Other pulmonary embolism without acute cor pulmonale: Secondary | ICD-10-CM

## 2013-10-22 DIAGNOSIS — E119 Type 2 diabetes mellitus without complications: Secondary | ICD-10-CM

## 2013-10-22 DIAGNOSIS — M899 Disorder of bone, unspecified: Secondary | ICD-10-CM

## 2013-10-22 DIAGNOSIS — F0280 Dementia in other diseases classified elsewhere without behavioral disturbance: Secondary | ICD-10-CM

## 2013-10-22 DIAGNOSIS — L309 Dermatitis, unspecified: Secondary | ICD-10-CM

## 2013-10-22 DIAGNOSIS — L259 Unspecified contact dermatitis, unspecified cause: Secondary | ICD-10-CM

## 2013-10-22 MED ORDER — CLOBETASOL PROPIONATE 0.05 % EX CREA: 1.0000 "application " | TOPICAL_CREAM | Freq: Two times a day (BID) | CUTANEOUS | Status: DC

## 2013-10-22 MED ORDER — BENZONATATE 100 MG PO CAPS: 100.0000 mg | ORAL_CAPSULE | Freq: Four times a day (QID) | ORAL | Status: DC | PRN

## 2013-10-22 MED ORDER — TRAMADOL HCL 50 MG PO TABS: 50.0000 mg | ORAL_TABLET | Freq: Two times a day (BID) | ORAL | Status: DC

## 2013-10-22 MED ORDER — TEMAZEPAM 7.5 MG PO CAPS: 7.5000 mg | ORAL_CAPSULE | Freq: Every evening | ORAL | Status: DC | PRN

## 2013-10-22 NOTE — Progress Notes (Signed)
  Subjective:    Patient ID: Samuel Ryan, male    DOB: 18-Apr-1931, 79 y.o.   MRN: [id number]  HPI The PT is here for follow up and re-evaluation of chronic medical conditions, medication management and review of any available recent lab and radiology data.  Preventive health is updated, specifically  Cancer screening and Immunization.   Questions or concerns regarding consultations or procedures which the PT has had in the interim are  addressed. The PT denies any adverse reactions to current medications since the last visit.  There are no new concerns. Still having significant difficulty sleeping , needs help for this. Admits to over eating candy, he has an 18 pound weight gain also his diabetes is uncontrolled      Review of Systems See HPI Denies recent fever or chills. Denies sinus pressure, nasal congestion, ear pain or sore throat. Denies chest congestion, productive cough or wheezing.Has increased chest congestio and coughnot unusual for the season, wants a decongestant Denies chest pains, palpitations and leg swelling Denies abdominal pain, nausea, vomiting,diarrhea or constipation.   Denies dysuria, frequency, hesitancy or incontinence. Chronic  joint pain,  and limitation in mobility unchanged, ambulates with assistance Denies headaches, seizures, numbness, or tingling. Denies uncontrolled  Depression or  anxiety  Denies skin break down or rash.Expects dryness and itch during Winter and requests steroid which has helped in the past for this        Objective:   Physical Exam  Patient alert and oriented and in no cardiopulmonary distress.  HEENT: No facial asymmetry, EOMI, no sinus tenderness,  oropharynx pink and moist.  Neck decreased though adequate ROM, no adenopathy.  Chest: Clear to auscultation bilaterally.  CVS: S1, S2 no murmurs, no S3.  ABD: Soft non tender. Bowel sounds normal.  Ext: No edema  MS: decreased ROM spine, shoulders, hips and  knees.  Skin: Intact, no ulcerations or rash noted.  Psych: Good eye contact, . Memory impaired not anxious or depressed appearing.  CNS: CN 2-12 intact, power,  normal throughout.       Assessment & Plan:

## 2013-10-22 NOTE — Patient Instructions (Addendum)
F/u in 3.5 month, call if you need me before  Cut back on sugar ,  You have gained 18 pounds and blood sugar has gone up   Fasting lipid, cmp and hBa1C and Vit D and tSH in 3.5 month, before visit  Cream and medication for cough are sent in

## 2013-10-27 ENCOUNTER — Telehealth: Payer: Self-pay | Admitting: Family Medicine

## 2013-10-27 DIAGNOSIS — L309 Dermatitis, unspecified: Secondary | ICD-10-CM | POA: Insufficient documentation

## 2013-10-27 NOTE — Assessment & Plan Note (Signed)
Improved with medication, sleep however is a major problem so will add restoril

## 2013-10-27 NOTE — Assessment & Plan Note (Signed)
Controlled, no change in medication Hyperlipidemia:Low fat diet discussed and encouraged.  \ 

## 2013-10-27 NOTE — Telephone Encounter (Signed)
Please call pt , explain to pt and his wife that since he is diabetic, it is recommended he start a medication to help to protect his kidneys from damage. Benazepril is entered pls fax after you spk with them. Advise "allergic rxn " of tickle i throat, or cough, if occurs , needs to stop the med and let us know. Also pls get samples of his onglyza if he needs per routine Also remind him that he need to stop "baby Windell Moulding" etc as he had gained 18 pounds and blood sugar had gone up. Let him know I hope the new medication is helping hi to get better sleep

## 2013-10-27 NOTE — Assessment & Plan Note (Signed)
Unchanged, tylenol as needed, with limitation so not used in excess

## 2013-10-27 NOTE — Assessment & Plan Note (Addendum)
Deteriorated control, needs to reduce intake of sweets and carbolydrates, reportedly snacking on "Baby Windell Moulding" chocolate No med change Needs ACE will try low dose benazepril, pt to be notified after visit about this by nurse, then med sent in

## 2013-10-27 NOTE — Assessment & Plan Note (Signed)
Anticipated flare in Winter months, med prescribed for sparing use as needed, but not on the face

## 2013-10-27 NOTE — Assessment & Plan Note (Signed)
Still significant sleep problems despite improvement in mental health, start restoril

## 2013-10-27 NOTE — Assessment & Plan Note (Signed)
Seasonal cough and chest congestion, tessalon perles , fr as needed use

## 2013-10-27 NOTE — Assessment & Plan Note (Addendum)
Failed chronic anti coagulation with GI bleed, also hematoma. No c/o shortness of breath

## 2013-10-27 NOTE — Assessment & Plan Note (Signed)
No evident deterioration, and tolerating current medication, MMSE next visit.

## 2013-10-29 ENCOUNTER — Other Ambulatory Visit: Payer: Self-pay

## 2013-10-29 DIAGNOSIS — E119 Type 2 diabetes mellitus without complications: Secondary | ICD-10-CM

## 2013-10-29 MED ORDER — BENAZEPRIL HCL 5 MG PO TABS: 5.0000 mg | ORAL_TABLET | Freq: Every day | ORAL | Status: DC

## 2013-10-29 NOTE — Telephone Encounter (Signed)
Patients wife aware and med sent to his pharmacy

## 2013-10-31 ENCOUNTER — Other Ambulatory Visit: Payer: Self-pay

## 2013-10-31 DIAGNOSIS — E119 Type 2 diabetes mellitus without complications: Secondary | ICD-10-CM

## 2013-10-31 MED ORDER — BENAZEPRIL HCL 5 MG PO TABS: 5.0000 mg | ORAL_TABLET | Freq: Every day | ORAL | Status: DC

## 2014-01-28 ENCOUNTER — Other Ambulatory Visit: Payer: Self-pay | Admitting: Family Medicine

## 2014-01-28 LAB — COMPREHENSIVE METABOLIC PANEL
ALT: 10 U/L (ref 0–53)
AST: 13 U/L (ref 0–37)
Albumin: 3.3 g/dL — ABNORMAL LOW (ref 3.5–5.2)
Alkaline Phosphatase: 59 U/L (ref 39–117)
BUN: 13 mg/dL (ref 6–23)
CO2: 27 mEq/L (ref 19–32)
Calcium: 8.2 mg/dL — ABNORMAL LOW (ref 8.4–10.5)
Chloride: 105 mEq/L (ref 96–112)
Creat: 1.56 mg/dL — ABNORMAL HIGH (ref 0.50–1.35)
Glucose, Bld: 170 mg/dL — ABNORMAL HIGH (ref 70–99)
Potassium: 4.6 mEq/L (ref 3.5–5.3)
Sodium: 140 mEq/L (ref 135–145)
Total Bilirubin: 0.8 mg/dL (ref 0.2–1.2)
Total Protein: 7.1 g/dL (ref 6.0–8.3)

## 2014-01-28 LAB — LIPID PANEL
Cholesterol: 127 mg/dL (ref 0–200)
HDL: 36 mg/dL — ABNORMAL LOW (ref >39)
LDL Cholesterol: 70 mg/dL (ref 0–99)
Total CHOL/HDL Ratio: 3.5 Ratio
Triglycerides: 104 mg/dL (ref <150)
VLDL: 21 mg/dL (ref 0–40)

## 2014-01-28 LAB — TSH: TSH: 0.888 u[IU]/mL (ref 0.350–4.500)

## 2014-01-28 LAB — HEMOGLOBIN A1C
Hgb A1c MFr Bld: 9 % — ABNORMAL HIGH (ref <5.7)
Mean Plasma Glucose: 212 mg/dL — ABNORMAL HIGH (ref <117)

## 2014-01-29 LAB — CREATININE WITH EST GFR
CREATININE: 1.53 mg/dL — AB (ref 0.50–1.35)
GFR, EST NON AFRICAN AMERICAN: 42 mL/min — AB
GFR, Est African American: 49 mL/min — ABNORMAL LOW

## 2014-01-29 LAB — VITAMIN D 25 HYDROXY (VIT D DEFICIENCY, FRACTURES): Vit D, 25-Hydroxy: 18 ng/mL — ABNORMAL LOW (ref 30–89)

## 2014-01-31 ENCOUNTER — Encounter: Payer: Self-pay | Admitting: Family Medicine

## 2014-01-31 ENCOUNTER — Encounter (INDEPENDENT_AMBULATORY_CARE_PROVIDER_SITE_OTHER): Payer: Self-pay

## 2014-01-31 ENCOUNTER — Ambulatory Visit (INDEPENDENT_AMBULATORY_CARE_PROVIDER_SITE_OTHER): Payer: Medicare Other | Admitting: Family Medicine

## 2014-01-31 VITALS — BP 132/78 | HR 72 | Resp 16 | Ht 70.0 in | Wt 249.0 lb

## 2014-01-31 DIAGNOSIS — G309 Alzheimer's disease, unspecified: Principal | ICD-10-CM

## 2014-01-31 DIAGNOSIS — E785 Hyperlipidemia, unspecified: Secondary | ICD-10-CM

## 2014-01-31 DIAGNOSIS — E559 Vitamin D deficiency, unspecified: Secondary | ICD-10-CM

## 2014-01-31 DIAGNOSIS — F02818 Dementia in other diseases classified elsewhere, unspecified severity, with other behavioral disturbance: Secondary | ICD-10-CM

## 2014-01-31 DIAGNOSIS — J3089 Other allergic rhinitis: Secondary | ICD-10-CM | POA: Insufficient documentation

## 2014-01-31 DIAGNOSIS — J309 Allergic rhinitis, unspecified: Secondary | ICD-10-CM

## 2014-01-31 DIAGNOSIS — M129 Arthropathy, unspecified: Secondary | ICD-10-CM

## 2014-01-31 DIAGNOSIS — K5730 Diverticulosis of large intestine without perforation or abscess without bleeding: Secondary | ICD-10-CM

## 2014-01-31 DIAGNOSIS — E119 Type 2 diabetes mellitus without complications: Secondary | ICD-10-CM

## 2014-01-31 DIAGNOSIS — F0281 Dementia in other diseases classified elsewhere with behavioral disturbance: Secondary | ICD-10-CM

## 2014-01-31 DIAGNOSIS — F0280 Dementia in other diseases classified elsewhere without behavioral disturbance: Secondary | ICD-10-CM

## 2014-01-31 DIAGNOSIS — E049 Nontoxic goiter, unspecified: Secondary | ICD-10-CM

## 2014-01-31 MED ORDER — GLIPIZIDE ER 2.5 MG PO TB24: 2.5000 mg | ORAL_TABLET | Freq: Every day | ORAL | Status: DC

## 2014-01-31 MED ORDER — ERGOCALCIFEROL 1.25 MG (50000 UT) PO CAPS: 50000.0000 [IU] | ORAL_CAPSULE | ORAL | Status: DC

## 2014-01-31 MED ORDER — FLUTICASONE PROPIONATE 50 MCG/ACT NA SUSP: 2.0000 | Freq: Every day | NASAL | Status: DC

## 2014-01-31 NOTE — Patient Instructions (Addendum)
F/u in 3 month, call if you need me before  New for  Allergies, flonase , also new is glipizide for blood sugar and new for low vit D is once weekly vit D  Blood sugar is high, otherwise labs are great , also your blood pressure  Morning sugars before breakfast should be between 110 to 140, please cut back on sweets, ice cream and sweetened drinks.Call if blood sugar stays high please  Non fast HBA1C, chem 7 and EGFR in 3 month, before follow up [facility], and needs to be faxed here)

## 2014-02-03 NOTE — Assessment & Plan Note (Signed)
Ambulates in a wheelchair most of the time for safety, no falls since lasty visit, anxious to drive, but not advised to do so

## 2014-02-03 NOTE — Assessment & Plan Note (Signed)
updated [place] needed, pt has refused in the past, thyroid function is normal and he has no symptoms of compression, will specifically enquire about this at next visit

## 2014-02-03 NOTE — Assessment & Plan Note (Signed)
Weekly vit d supplements required, pt understands this and will comply, will help to reduce falls

## 2014-02-03 NOTE — Assessment & Plan Note (Signed)
Controlled, no change in medication Hyperlipidemia:Low fat diet discussed and encouraged.  Increased activity as tolerated is encouraged

## 2014-02-03 NOTE — Assessment & Plan Note (Signed)
Adequate ly controlled on current medications , no changes at this time

## 2014-02-03 NOTE — Assessment & Plan Note (Signed)
Increased symptoms as expected at this time of the year, pt to start flonase

## 2014-02-03 NOTE — Assessment & Plan Note (Signed)
Deteriorated and uncontrolled. Add low dose glipizde, compliance with diet stressed also

## 2014-02-03 NOTE — Assessment & Plan Note (Signed)
Denies visible rectal blood since last visit

## 2014-02-03 NOTE — Progress Notes (Signed)
   Subjective:    Patient ID: Samuel Ryan, male    DOB: 02/03/1931, 80 y.o.   MRN: [id number]  HPI The PT is here for follow up and re-evaluation of chronic medical conditions, medication management and review of any available recent lab and radiology data.  Preventive health is updated, specifically   Immunization.    The PT denies any adverse reactions to current medications since the last visit. States he believes his old meter was not working and is very upset to find out that this is so, as his blood sugars are markedly out of control, which is not his usual situation. Understands the need to get them back in control and will work at this Wants to drive, however has dementia and severe joint stiffness with poor reflexes, so this is not an option, to his dismay        Review of Systems See HPI Denies recent fever or chills. Denies sinus pressure, nasal congestion, ear pain or sore throat. Denies chest congestion, productive cough or wheezing. Denies chest pains, palpitations and leg swelling Denies abdominal pain, nausea, vomiting,diarrhea or constipation.   Denies dysuria, frequency, hesitancy or incontinence. C/o  joint pain, swelling and limitation in mobility.uses a wheelchair for safe mobility Denies headaches, seizures, numbness, or tingling. Denies uncontrolled  depression, anxiety or insomnia. Denies skin break down or rash.        Objective:   Physical Exam BP 132/78  Pulse 72  Resp 16  Ht 5\' 10"  (1.778 m)  Wt 249 lb (112.946 kg)  BMI 35.73 kg/m2  SpO2 94% Patient alert and oriented and in no cardiopulmonary distress.  HEENT: No facial asymmetry, EOMI, no sinus tenderness,  oropharynx pink and moist.  Neck decreased ROM no adenopathy.no jVD  Chest: Clear to auscultation bilaterally.  CVS: S1, S2 no murmurs, no S3.  ABD: Soft non tender. Bowel sounds normal.  Ext: No edema  MS: decreased  ROM spine, shoulders, hips and knees.  Skin: Intact, no  ulcerations or rash noted.  Psych: Good eye contact, normal affect. Memory loss not anxious or depressed appearing.  CNS: CN 2-12 intact, power,  normal throughout.        Assessment & Plan:  DM Deteriorated and uncontrolled. Add low dose glipizde, compliance with diet stressed also  Dementia of Alzheimer's type with behavioral disturbance Adequate ly controlled on current medications , no changes at this time  Hyperlipidemia LDL goal < 100 Controlled, no change in medication Hyperlipidemia:Low fat diet discussed and encouraged.  Increased activity as tolerated is encouraged  ARTHRITIS Ambulates in a wheelchair most of the time for safety, no falls since lasty visit, anxious to drive, but not advised to do so  Goiter, unspecified updated [place] needed, pt has refused in the past, thyroid function is normal and he has no symptoms of compression, will specifically enquire about this at next visit   Allergic rhinitis Increased symptoms as expected at this time of the year, pt to start flonase  Diverticulosis of sigmoid colon Denies visible rectal blood since last visit  Unspecified vitamin D deficiency Weekly vit d supplements required, pt understands this and will comply, will help to reduce falls

## 2014-02-28 ENCOUNTER — Other Ambulatory Visit: Payer: Self-pay | Admitting: Family Medicine

## 2014-04-03 ENCOUNTER — Other Ambulatory Visit: Payer: Self-pay | Admitting: Family Medicine

## 2014-04-08 ENCOUNTER — Telehealth: Payer: Self-pay | Admitting: Family Medicine

## 2014-04-09 NOTE — Telephone Encounter (Signed)
Check on tradjenta someone else had this covered, pls f/u with alternative let pt know we will certainly look iat this and try to get alternative asap

## 2014-04-09 NOTE — Telephone Encounter (Signed)
Patient would like to know if dr will change Onglyza to a more affordable medicine.  Formulary to be provided.

## 2014-04-11 ENCOUNTER — Encounter (HOSPITAL_COMMUNITY): Payer: Self-pay | Admitting: Emergency Medicine

## 2014-04-11 ENCOUNTER — Emergency Department (HOSPITAL_COMMUNITY)
Admission: EM | Admit: 2014-04-11 | Discharge: 2014-04-11 | Disposition: A | Discharge status: Home / Self Care | Payer: Medicare Other | Attending: Emergency Medicine | Admitting: Emergency Medicine

## 2014-04-11 ENCOUNTER — Emergency Department (HOSPITAL_COMMUNITY): Payer: Medicare Other

## 2014-04-11 DIAGNOSIS — IMO0002 Reserved for concepts with insufficient information to code with codable children: Secondary | ICD-10-CM | POA: Insufficient documentation

## 2014-04-11 DIAGNOSIS — J189 Pneumonia, unspecified organism: Secondary | ICD-10-CM

## 2014-04-11 DIAGNOSIS — I10 Essential (primary) hypertension: Secondary | ICD-10-CM | POA: Insufficient documentation

## 2014-04-11 DIAGNOSIS — Z79899 Other long term (current) drug therapy: Secondary | ICD-10-CM | POA: Insufficient documentation

## 2014-04-11 DIAGNOSIS — Z7982 Long term (current) use of aspirin: Secondary | ICD-10-CM | POA: Insufficient documentation

## 2014-04-11 DIAGNOSIS — Z86718 Personal history of other venous thrombosis and embolism: Secondary | ICD-10-CM | POA: Insufficient documentation

## 2014-04-11 DIAGNOSIS — J159 Unspecified bacterial pneumonia: Secondary | ICD-10-CM | POA: Insufficient documentation

## 2014-04-11 DIAGNOSIS — F0390 Unspecified dementia without behavioral disturbance: Secondary | ICD-10-CM | POA: Insufficient documentation

## 2014-04-11 DIAGNOSIS — Z8719 Personal history of other diseases of the digestive system: Secondary | ICD-10-CM | POA: Insufficient documentation

## 2014-04-11 DIAGNOSIS — Z86711 Personal history of pulmonary embolism: Secondary | ICD-10-CM | POA: Insufficient documentation

## 2014-04-11 DIAGNOSIS — M1990 Unspecified osteoarthritis, unspecified site: Secondary | ICD-10-CM | POA: Insufficient documentation

## 2014-04-11 DIAGNOSIS — E119 Type 2 diabetes mellitus without complications: Secondary | ICD-10-CM | POA: Insufficient documentation

## 2014-04-11 DIAGNOSIS — E782 Mixed hyperlipidemia: Secondary | ICD-10-CM | POA: Insufficient documentation

## 2014-04-11 MED ORDER — HYDROCOD POLST-CHLORPHEN POLST 10-8 MG/5ML PO LQCR: 5.0000 mL | Freq: Two times a day (BID) | ORAL | Status: DC | PRN

## 2014-04-11 MED ORDER — HYDROCOD POLST-CHLORPHEN POLST 10-8 MG/5ML PO LQCR
5.0000 mL | Freq: Once | ORAL | Status: AC
  Administered 2014-04-11: 5 mL via ORAL
  Filled 2014-04-11: qty 5

## 2014-04-11 MED ORDER — AZITHROMYCIN 250 MG PO TABS
500.0000 mg | ORAL_TABLET | Freq: Once | ORAL | Status: AC
  Administered 2014-04-11: 500 mg via ORAL
  Filled 2014-04-11: qty 2

## 2014-04-11 MED ORDER — ALBUTEROL SULFATE HFA 108 (90 BASE) MCG/ACT IN AERS
2.0000 | INHALATION_SPRAY | Freq: Once | RESPIRATORY_TRACT | Status: AC
  Administered 2014-04-11: 2 via RESPIRATORY_TRACT
  Filled 2014-04-11: qty 6.7

## 2014-04-11 MED ORDER — AZITHROMYCIN 250 MG PO TABS: ORAL_TABLET | ORAL | Status: DC

## 2014-04-11 NOTE — ED Provider Notes (Signed)
CSN: [id number]     Arrival date & time 04/11/14  2979 History   First MD Initiated Contact with Patient 04/11/14 [phone]     Chief Complaint  Patient presents with  . Cough     (Consider location/radiation/quality/duration/timing/severity/associated sxs/prior Treatment) HPI.... cough for one to 2 weeks without fever, chills, rusty sputum. No history of asthma or COPD. Severity is mild to moderate. Nothing makes symptoms better or worse. No chest pain.  Past Medical History  Diagnosis Date  . Type 2 diabetes mellitus   . Mixed hyperlipidemia   . Essential hypertension, benign   . Dementia   . BPH (benign prostatic hypertrophy)   . Pulmonary embolism     Diagnosed 7/12  . Deep venous thrombosis     Left leg, diagnosed 7/12  . Osteoarthritis   . Diverticulitis   . Acute cholecystitis 12/12/2012 to 12/19/2012    drainage tube placed, hosptalizred x 1 week, then nursing home   Past Surgical History  Procedure Laterality Date  . Hernia repair    . Neck fusion    . Carpal tunnel release    . Right total knee replacement  04/23/2011  . Colonoscopy  08/31/2012    Procedure: COLONOSCOPY;  Surgeon: Rogene Houston, MD;  Location: [facility];  Service: Endoscopy;  Laterality: N/A;  43   Family History  Problem Relation Age of Onset  . Cancer Mother     Stomach  . Diabetes Sister   . Hypertension Sister   . Diabetes Brother   . Cancer Brother     Gallbladder   History  Substance Use Topics  . Smoking status: Never Smoker   . Smokeless tobacco: Never Used  . Alcohol Use: No    Review of Systems  All other systems reviewed and are negative.     Allergies  Review of patient's allergies indicates no known allergies.  Home Medications   Prior to Admission medications   Medication Sig Start Date End Date Taking? Authorizing Provider  acetaminophen (TYLENOL) 325 MG tablet Take 650 mg by mouth every 6 (six) hours as needed. Pain.   Yes Historical Provider, MD  aspirin EC  81 MG tablet Take 1 tablet (81 mg total) by mouth daily. 06/19/13 06/19/14 Yes [name], MD  benazepril (LOTENSIN) 5 MG tablet TAKE ONE (1) TABLET EACH DAY 04/03/14  Yes [name], MD  clobetasol cream (TEMOVATE) 1.94 % Apply 1 application topically 2 (two) times daily. 10/22/13  Yes [name], MD  donepezil (ARICEPT) 10 MG tablet TAKE ONE TABLET DAILY AT BEDTIME 02/28/14  Yes [name], MD  ergocalciferol (VITAMIN D2) 50000 UNITS capsule Take 1 capsule (50,000 Units total) by mouth once a week. One capsule once weekly 01/31/14  Yes [name], MD  ferrous sulfate 325 (65 FE) MG tablet TAKE ONE TABLET BY MOUTH TWICE DAILY 02/28/14  Yes [name], MD  fluticasone [facility]) 50 MCG/ACT nasal spray Place 2 sprays into both nostrils daily. 01/31/14  Yes [name], MD  glipiZIDE (GLUCOTROL XL) 2.5 MG 24 hr tablet Take 1 tablet (2.5 mg total) by mouth daily with breakfast. 01/31/14  Yes [name], MD  lovastatin (MEVACOR) 40 MG tablet TAKE ONE TABLET DAILY AT BEDTIME 02/28/14  Yes [name], MD  saxagliptin HCl (ONGLYZA) 5 MG TABS tablet Take 1 tablet (5 mg total) by mouth daily. 07/03/13  Yes [name], MD  sertraline (ZOLOFT) 50 MG tablet TAKE  ONE (1) TABLET EACH DAY 02/28/14  Yes [name], MD  silodosin (RAPAFLO) 8 MG CAPS capsule Take 1 capsule (8 mg total) by mouth daily with breakfast. 09/06/12  Yes [name], MD  traMADol (ULTRAM) 50 MG tablet Take 1 tablet (50 mg total) by mouth 2 (two) times daily. 10/22/13 10/22/14 Yes [name], MD  ferrous sulfate 325 (65 FE) MG EC tablet Take 325 mg by mouth 2 (two) times daily.    Historical Provider, MD  ranitidine (ZANTAC) 150 MG tablet Take 1 tablet (150 mg total) by mouth 2 (two) times daily. 06/19/13 07/03/13  [name], MD  temazepam (RESTORIL) 7.5 MG capsule Take 1 capsule (7.5 mg total) by mouth at bedtime as needed for sleep. 10/22/13   [name], MD   BP 130/78  Pulse 56  Temp(Src) 98.3 F (36.8 C) (Oral)  Resp 20  Ht 6' (1.829 m)  Wt 250 lb (113.399 kg)  BMI 33.90 kg/m2  SpO2 96% Physical Exam  Nursing note and vitals reviewed. Constitutional: He is oriented to person, place, and time. He appears well-developed and well-nourished.  Coughing  HENT:  Head: Normocephalic and atraumatic.  Eyes: Conjunctivae and EOM are normal. Pupils are equal, round, and reactive to light.  Neck: Normal range of motion. Neck supple.  Cardiovascular: Normal rate, regular rhythm and normal heart sounds.   Pulmonary/Chest: Effort normal and breath sounds normal.  Abdominal: Soft. Bowel sounds are normal.  Musculoskeletal: Normal range of motion.  Neurological: He is alert and oriented to person, place, and time.  Skin: Skin is warm and dry.  Psychiatric: He has a normal mood and affect. His behavior is normal.    ED Course  Procedures (including critical care time) Labs Review Labs Reviewed - No data to display  Imaging Review Dg Chest 2 View  04/11/2014   CLINICAL DATA:  Cough for 2 weeks.  EXAM: CHEST  2 VIEW  COMPARISON:  DG CHEST 2 VIEW dated 12/12/2012  FINDINGS: Linear opacities in the lung bases similar to previous study, likely representing scarring. In addition, there is increasing infiltration in the right lung base suggesting a superimposed infiltrate or pneumonia. Normal heart size and pulmonary vascularity. Hyperinflation in the lungs suggesting emphysema. Degenerative changes in the spine.  IMPRESSION: Chronic emphysematous changes and scarring in the lung bases. Superimposed increasing infiltration in the right lung base may indicate infiltration or pneumonia.   Electronically Signed   By: Lucienne Capers M.D.   On: 04/11/2014 04:21     EKG Interpretation None      MDM   Final diagnoses:  Community acquired pneumonia    Patient is hemodynamically stable. Chest x-ray shows right basilar pneumonia. Rx  Zithromax, albuterol inhaler, Tussionex cough syrup. Patient has primary care followup. He does not need to be admitted to the hospital at this time    Nat Christen, MD 04/11/14 [phone]

## 2014-04-11 NOTE — Discharge Instructions (Signed)
X-ray shows pneumonia in the base of the right lung.   Prescription for antibiotic and cough syrup.  Use your inhaler 2 puffs every 3 hours. Increase fluids. Rest. Followup your primary care Dr.

## 2014-04-11 NOTE — ED Notes (Signed)
Pt states he has had a productive cough for the past week that became worse tonight.

## 2014-04-11 NOTE — Telephone Encounter (Signed)
Tier exception form completed.

## 2014-04-11 NOTE — ED Notes (Signed)
Pt alert & oriented x4, stable gait. Patient  given discharge instructions, paperwork & prescription(s). Patient verbalized understanding. Pt left department w/ no further questions. 

## 2014-04-16 ENCOUNTER — Telehealth: Payer: Self-pay

## 2014-04-16 NOTE — Telephone Encounter (Signed)
States Onglyza is too expensive and wants to know if you can call in something that is cheaper. Please advise

## 2014-04-16 NOTE — Telephone Encounter (Signed)
Based on recent HBa1C of 9, I believe he needs glipizde at least 5 mg one daily so change dose to thsi  (the 24 hr tab) , explain he can take two af the 2.5 mg tabs together toill done, also he needs to get his HBa1C whe n due and call us in next 1 week if FBG high on the higher dose of glipizide, his most recent HBA1C was not good, he needs to stay away from candy also  Questions , pls ask

## 2014-04-17 ENCOUNTER — Other Ambulatory Visit: Payer: Self-pay

## 2014-04-17 MED ORDER — GLIPIZIDE ER 5 MG PO TB24: 5.0000 mg | ORAL_TABLET | Freq: Every day | ORAL | Status: DC

## 2014-04-17 NOTE — Telephone Encounter (Signed)
Called patient and left message for them to return call at the office   

## 2014-04-17 NOTE — Telephone Encounter (Signed)
Wife aware, med sent

## 2014-04-26 ENCOUNTER — Other Ambulatory Visit: Payer: Self-pay | Admitting: Family Medicine

## 2014-05-02 LAB — BASIC METABOLIC PANEL WITH GFR
BUN: 13 mg/dL (ref 6–23)
CO2: 27 mEq/L (ref 19–32)
Calcium: 9.2 mg/dL (ref 8.4–10.5)
Chloride: 101 mEq/L (ref 96–112)
Creat: 1.5 mg/dL — ABNORMAL HIGH (ref 0.50–1.35)
GFR, Est African American: 50 mL/min — ABNORMAL LOW
GFR, Est Non African American: 43 mL/min — ABNORMAL LOW
GLUCOSE: 171 mg/dL — AB (ref 70–99)
POTASSIUM: 4.7 meq/L (ref 3.5–5.3)
Sodium: 136 mEq/L (ref 135–145)

## 2014-05-02 LAB — HEMOGLOBIN A1C
Hgb A1c MFr Bld: 8.7 % — ABNORMAL HIGH (ref <5.7)
Mean Plasma Glucose: 203 mg/dL — ABNORMAL HIGH (ref <117)

## 2014-05-07 ENCOUNTER — Encounter: Payer: Self-pay | Admitting: Family Medicine

## 2014-05-07 ENCOUNTER — Ambulatory Visit (HOSPITAL_COMMUNITY)
Admission: RE | Admit: 2014-05-07 | Discharge: 2014-05-07 | Disposition: A | Discharge status: Home / Self Care | Payer: Medicare Other | Source: Ambulatory Visit | Attending: Family Medicine | Admitting: Family Medicine

## 2014-05-07 ENCOUNTER — Ambulatory Visit (INDEPENDENT_AMBULATORY_CARE_PROVIDER_SITE_OTHER): Payer: Medicare Other | Admitting: Family Medicine

## 2014-05-07 VITALS — BP 134/80 | HR 80 | Resp 16 | Ht 70.0 in | Wt 250.0 lb

## 2014-05-07 DIAGNOSIS — E0789 Other specified disorders of thyroid: Secondary | ICD-10-CM

## 2014-05-07 DIAGNOSIS — E042 Nontoxic multinodular goiter: Secondary | ICD-10-CM | POA: Insufficient documentation

## 2014-05-07 DIAGNOSIS — F329 Major depressive disorder, single episode, unspecified: Secondary | ICD-10-CM

## 2014-05-07 DIAGNOSIS — J189 Pneumonia, unspecified organism: Secondary | ICD-10-CM

## 2014-05-07 DIAGNOSIS — E119 Type 2 diabetes mellitus without complications: Secondary | ICD-10-CM

## 2014-05-07 DIAGNOSIS — F0280 Dementia in other diseases classified elsewhere without behavioral disturbance: Secondary | ICD-10-CM

## 2014-05-07 DIAGNOSIS — F0281 Dementia in other diseases classified elsewhere with behavioral disturbance: Secondary | ICD-10-CM

## 2014-05-07 DIAGNOSIS — F3289 Other specified depressive episodes: Secondary | ICD-10-CM

## 2014-05-07 DIAGNOSIS — G309 Alzheimer's disease, unspecified: Secondary | ICD-10-CM

## 2014-05-07 DIAGNOSIS — E785 Hyperlipidemia, unspecified: Secondary | ICD-10-CM

## 2014-05-07 DIAGNOSIS — F02818 Dementia in other diseases classified elsewhere, unspecified severity, with other behavioral disturbance: Secondary | ICD-10-CM

## 2014-05-07 DIAGNOSIS — F0393 Unspecified dementia, unspecified severity, with mood disturbance: Secondary | ICD-10-CM

## 2014-05-07 DIAGNOSIS — D497 Neoplasm of unspecified behavior of endocrine glands and other parts of nervous system: Secondary | ICD-10-CM

## 2014-05-07 MED ORDER — DONEPEZIL HCL 10 MG PO TABS: ORAL_TABLET | ORAL | Status: DC

## 2014-05-07 MED ORDER — FERROUS SULFATE 325 (65 FE) MG PO TABS: ORAL_TABLET | ORAL | Status: DC

## 2014-05-07 MED ORDER — LOVASTATIN 40 MG PO TABS: ORAL_TABLET | ORAL | Status: DC

## 2014-05-07 MED ORDER — GLIPIZIDE ER 10 MG PO TB24: 10.0000 mg | ORAL_TABLET | Freq: Every day | ORAL | Status: DC

## 2014-05-07 MED ORDER — SERTRALINE HCL 50 MG PO TABS: ORAL_TABLET | ORAL | Status: DC

## 2014-05-07 NOTE — Patient Instructions (Addendum)
Annual physical exam in 3. month, call if you need me before  Increase in glipizide ER to 69m once daily , OK to take TWO 54mtablets daily till done, blood sugar is still too high   CXR today and hopefully the [place] your thyroid gland  [place] to [place], cmp and EGFR , hBA1C

## 2014-05-08 ENCOUNTER — Other Ambulatory Visit: Payer: Self-pay

## 2014-05-08 DIAGNOSIS — E042 Nontoxic multinodular goiter: Secondary | ICD-10-CM

## 2014-05-09 ENCOUNTER — Emergency Department (HOSPITAL_COMMUNITY): Payer: Medicare Other

## 2014-05-09 ENCOUNTER — Inpatient Hospital Stay (HOSPITAL_COMMUNITY)
Admission: EM | Admit: 2014-05-09 | Discharge: 2014-05-11 | DRG: 195 | Disposition: A | Discharge status: Home Health | Payer: Medicare Other | Attending: Internal Medicine | Admitting: Internal Medicine

## 2014-05-09 ENCOUNTER — Inpatient Hospital Stay (HOSPITAL_COMMUNITY): Payer: Medicare Other

## 2014-05-09 ENCOUNTER — Encounter (HOSPITAL_COMMUNITY): Payer: Self-pay | Admitting: Emergency Medicine

## 2014-05-09 DIAGNOSIS — J189 Pneumonia, unspecified organism: Principal | ICD-10-CM | POA: Diagnosis present

## 2014-05-09 DIAGNOSIS — Z79899 Other long term (current) drug therapy: Secondary | ICD-10-CM

## 2014-05-09 DIAGNOSIS — Z86711 Personal history of pulmonary embolism: Secondary | ICD-10-CM

## 2014-05-09 DIAGNOSIS — Z981 Arthrodesis status: Secondary | ICD-10-CM

## 2014-05-09 DIAGNOSIS — E782 Mixed hyperlipidemia: Secondary | ICD-10-CM | POA: Diagnosis present

## 2014-05-09 DIAGNOSIS — E1165 Type 2 diabetes mellitus with hyperglycemia: Secondary | ICD-10-CM

## 2014-05-09 DIAGNOSIS — R0902 Hypoxemia: Secondary | ICD-10-CM

## 2014-05-09 DIAGNOSIS — Z833 Family history of diabetes mellitus: Secondary | ICD-10-CM

## 2014-05-09 DIAGNOSIS — IMO0002 Reserved for concepts with insufficient information to code with codable children: Secondary | ICD-10-CM | POA: Diagnosis present

## 2014-05-09 DIAGNOSIS — N400 Enlarged prostate without lower urinary tract symptoms: Secondary | ICD-10-CM | POA: Diagnosis present

## 2014-05-09 DIAGNOSIS — Z86718 Personal history of other venous thrombosis and embolism: Secondary | ICD-10-CM

## 2014-05-09 DIAGNOSIS — Z8701 Personal history of pneumonia (recurrent): Secondary | ICD-10-CM

## 2014-05-09 DIAGNOSIS — Z794 Long term (current) use of insulin: Secondary | ICD-10-CM

## 2014-05-09 DIAGNOSIS — Z8249 Family history of ischemic heart disease and other diseases of the circulatory system: Secondary | ICD-10-CM

## 2014-05-09 DIAGNOSIS — M1990 Unspecified osteoarthritis, unspecified site: Secondary | ICD-10-CM | POA: Diagnosis present

## 2014-05-09 DIAGNOSIS — E119 Type 2 diabetes mellitus without complications: Secondary | ICD-10-CM | POA: Diagnosis present

## 2014-05-09 DIAGNOSIS — E1121 Type 2 diabetes mellitus with diabetic nephropathy: Secondary | ICD-10-CM | POA: Diagnosis present

## 2014-05-09 DIAGNOSIS — I129 Hypertensive chronic kidney disease with stage 1 through stage 4 chronic kidney disease, or unspecified chronic kidney disease: Secondary | ICD-10-CM | POA: Diagnosis present

## 2014-05-09 DIAGNOSIS — Z96659 Presence of unspecified artificial knee joint: Secondary | ICD-10-CM

## 2014-05-09 DIAGNOSIS — F0390 Unspecified dementia without behavioral disturbance: Secondary | ICD-10-CM | POA: Diagnosis present

## 2014-05-09 DIAGNOSIS — Z7982 Long term (current) use of aspirin: Secondary | ICD-10-CM

## 2014-05-09 DIAGNOSIS — N182 Chronic kidney disease, stage 2 (mild): Secondary | ICD-10-CM | POA: Diagnosis present

## 2014-05-09 LAB — GLUCOSE, CAPILLARY
GLUCOSE-CAPILLARY: 142 mg/dL — AB (ref 70–99)
Glucose-Capillary: 356 mg/dL — ABNORMAL HIGH (ref 70–99)

## 2014-05-09 LAB — COMPREHENSIVE METABOLIC PANEL
ALT: 7 U/L (ref 0–53)
AST: 11 U/L (ref 0–37)
Albumin: 3.3 g/dL — ABNORMAL LOW (ref 3.5–5.2)
Alkaline Phosphatase: 68 U/L (ref 39–117)
BUN: 13 mg/dL (ref 6–23)
CHLORIDE: 101 meq/L (ref 96–112)
CO2: 28 mEq/L (ref 19–32)
CREATININE: 1.59 mg/dL — AB (ref 0.50–1.35)
Calcium: 9 mg/dL (ref 8.4–10.5)
GFR calc Af Amer: 45 mL/min — ABNORMAL LOW (ref >90)
GFR calc non Af Amer: 39 mL/min — ABNORMAL LOW (ref >90)
Glucose, Bld: 180 mg/dL — ABNORMAL HIGH (ref 70–99)
Potassium: 4.5 mEq/L (ref 3.7–5.3)
Sodium: 138 mEq/L (ref 137–147)
TOTAL PROTEIN: 7.3 g/dL (ref 6.0–8.3)
Total Bilirubin: 0.9 mg/dL (ref 0.3–1.2)

## 2014-05-09 LAB — CBC WITH DIFFERENTIAL/PLATELET
Basophils Absolute: 0 10*3/uL (ref 0.0–0.1)
Basophils Relative: 0 % (ref 0–1)
EOS ABS: 0.1 10*3/uL (ref 0.0–0.7)
Eosinophils Relative: 3 % (ref 0–5)
HCT: 34.6 % — ABNORMAL LOW (ref 39.0–52.0)
HEMOGLOBIN: 11.9 g/dL — AB (ref 13.0–17.0)
Lymphocytes Relative: 43 % (ref 12–46)
Lymphs Abs: 1.7 10*3/uL (ref 0.7–4.0)
MCH: 31.2 pg (ref 26.0–34.0)
MCHC: 34.4 g/dL (ref 30.0–36.0)
MCV: 90.8 fL (ref 78.0–100.0)
MONO ABS: 0.3 10*3/uL (ref 0.1–1.0)
MONOS PCT: 9 % (ref 3–12)
NEUTROS ABS: 1.8 10*3/uL (ref 1.7–7.7)
NEUTROS PCT: 45 % (ref 43–77)
Platelets: 166 10*3/uL (ref 150–400)
RBC: 3.81 MIL/uL — ABNORMAL LOW (ref 4.22–5.81)
RDW: 13.3 % (ref 11.5–15.5)
WBC: 3.9 10*3/uL — ABNORMAL LOW (ref 4.0–10.5)

## 2014-05-09 MED ORDER — VANCOMYCIN HCL IN DEXTROSE 1-5 GM/200ML-% IV SOLN
1000.0000 mg | Freq: Once | INTRAVENOUS | Status: AC
  Administered 2014-05-09: 1000 mg via INTRAVENOUS
  Filled 2014-05-09: qty 200

## 2014-05-09 MED ORDER — IOHEXOL 300 MG/ML  SOLN
80.0000 mL | Freq: Once | INTRAMUSCULAR | Status: AC | PRN
  Administered 2014-05-09: 80 mL via INTRAVENOUS

## 2014-05-09 MED ORDER — FLUTICASONE PROPIONATE 50 MCG/ACT NA SUSP
2.0000 | Freq: Every day | NASAL | Status: DC
  Administered 2014-05-10 – 2014-05-11 (×2): 2 via NASAL
  Filled 2014-05-09: qty 16

## 2014-05-09 MED ORDER — CLOBETASOL PROPIONATE 0.05 % EX OINT
1.0000 | TOPICAL_OINTMENT | Freq: Two times a day (BID) | CUTANEOUS | Status: DC
Start: 2014-05-09 — End: 2014-05-11
  Administered 2014-05-09 – 2014-05-11 (×3): 1 via TOPICAL
  Filled 2014-05-09: qty 15

## 2014-05-09 MED ORDER — LEVOFLOXACIN IN D5W 750 MG/150ML IV SOLN
750.0000 mg | INTRAVENOUS | Status: DC
  Administered 2014-05-09: 750 mg via INTRAVENOUS
  Filled 2014-05-09 (×2): qty 150

## 2014-05-09 MED ORDER — PIPERACILLIN-TAZOBACTAM 3.375 G IVPB 30 MIN
3.3750 g | Freq: Once | INTRAVENOUS | Status: AC
  Administered 2014-05-09: 3.375 g via INTRAVENOUS
  Filled 2014-05-09 (×2): qty 50

## 2014-05-09 MED ORDER — TAMSULOSIN HCL 0.4 MG PO CAPS
0.4000 mg | ORAL_CAPSULE | Freq: Every day | ORAL | Status: DC
Start: 2014-05-09 — End: 2014-05-11
  Administered 2014-05-09 – 2014-05-10 (×2): 0.4 mg via ORAL
  Filled 2014-05-09 (×2): qty 1

## 2014-05-09 MED ORDER — SERTRALINE HCL 50 MG PO TABS
50.0000 mg | ORAL_TABLET | Freq: Every day | ORAL | Status: DC
  Administered 2014-05-10 – 2014-05-11 (×2): 50 mg via ORAL
  Filled 2014-05-09 (×2): qty 1

## 2014-05-09 MED ORDER — INSULIN ASPART 100 UNIT/ML ~~LOC~~ SOLN
0.0000 [IU] | Freq: Every day | SUBCUTANEOUS | Status: DC
  Administered 2014-05-09: 5 [IU] via SUBCUTANEOUS

## 2014-05-09 MED ORDER — INSULIN ASPART 100 UNIT/ML ~~LOC~~ SOLN
0.0000 [IU] | Freq: Three times a day (TID) | SUBCUTANEOUS | Status: DC
  Administered 2014-05-09: 1 [IU] via SUBCUTANEOUS
  Administered 2014-05-10: 5 [IU] via SUBCUTANEOUS

## 2014-05-09 MED ORDER — FERROUS SULFATE 325 (65 FE) MG PO TABS
325.0000 mg | ORAL_TABLET | Freq: Two times a day (BID) | ORAL | Status: DC
  Administered 2014-05-09 – 2014-05-11 (×4): 325 mg via ORAL
  Filled 2014-05-09 (×3): qty 1

## 2014-05-09 MED ORDER — ENOXAPARIN SODIUM 60 MG/0.6ML ~~LOC~~ SOLN
55.0000 mg | SUBCUTANEOUS | Status: DC
  Administered 2014-05-09 – 2014-05-10 (×2): 55 mg via SUBCUTANEOUS
  Filled 2014-05-09 (×2): qty 0.6

## 2014-05-09 MED ORDER — DONEPEZIL HCL 5 MG PO TABS
10.0000 mg | ORAL_TABLET | Freq: Every day | ORAL | Status: DC
  Administered 2014-05-09 – 2014-05-10 (×2): 10 mg via ORAL
  Filled 2014-05-09 (×2): qty 2

## 2014-05-09 MED ORDER — TRAMADOL HCL 50 MG PO TABS
50.0000 mg | ORAL_TABLET | Freq: Two times a day (BID) | ORAL | Status: DC
  Administered 2014-05-09 – 2014-05-11 (×4): 50 mg via ORAL
  Filled 2014-05-09 (×4): qty 1

## 2014-05-09 MED ORDER — ASPIRIN EC 81 MG PO TBEC
81.0000 mg | DELAYED_RELEASE_TABLET | Freq: Every day | ORAL | Status: DC
  Administered 2014-05-10 – 2014-05-11 (×2): 81 mg via ORAL
  Filled 2014-05-09 (×2): qty 1

## 2014-05-09 MED ORDER — DIPHENHYDRAMINE HCL 50 MG/ML IJ SOLN
25.0000 mg | Freq: Once | INTRAMUSCULAR | Status: AC
  Administered 2014-05-09: 25 mg via INTRAVENOUS
  Filled 2014-05-09: qty 1

## 2014-05-09 MED ORDER — METHYLPREDNISOLONE SODIUM SUCC 125 MG IJ SOLR
60.0000 mg | Freq: Once | INTRAMUSCULAR | Status: AC
  Administered 2014-05-09: 60 mg via INTRAVENOUS
  Filled 2014-05-09: qty 2

## 2014-05-09 NOTE — ED Notes (Addendum)
Pt ambulates with walker.Ambulated pt with pulse oximetry. Pt o2 sat 96% at start of ambulation,shortly after walking out of room pt oxygen saturation decreased to 84%. Upon returning pt to room pt oxygen increased back to 96%. Pt heart rate during ambulation ranged from 69-72 beast per minute. EDP aware and at pt bedside.

## 2014-05-09 NOTE — ED Notes (Signed)
Family feeding pt hamburger and fried.  Asked not to feed pt until after seen via doctor and test result back.  Family understands.

## 2014-05-09 NOTE — ED Provider Notes (Signed)
CSN: [id number]     Arrival date & time 05/09/14  1448 History  This chart was scribed for Janice Norrie, MD by Eston Mould, ED Scribe. This patient was seen in room APA10/APA10 and the patient's care was started at 10:07 AM.   Chief Complaint  Patient presents with  . Cough   The history is provided by the patient and the spouse. No language interpreter was used.   HPI Comments: Samuel Ryan is a 80 y.o. male brought in by ambulance with hx of Type II DM, HTN, PE, DVT, and dementia who presents to the Emergency Department complaining of ongoing productive cough that began last night and has not improved. Pt state he was diagnosed with pneumonia on 5/14 and reports taking his antibiotics to completion; states "he was feeling better but last night had a constant cough with clear thick sputum sometimes. Wife states pt was SOB this morning. Wife states pt was advised to be brought in by EMS by home health aid due to low O2 levels, but they cannot recall how low it was. Wife states pt has been eating and drinking like normal; states pt did not eat breakfast this morning. Pt states the breakfast made him feel sick to his stomach. He denies fever, sore throat, rhinorrhea, emesis, diarrhea, CP, wheezing, leg swelling.  He has not used  inhaler use today. He denies  smoking, drinking, and oxygen use at home.   PCP: Dr. Moshe Cipro.  Past Medical History  Diagnosis Date  . Type 2 diabetes mellitus   . Mixed hyperlipidemia   . Essential hypertension, benign   . Dementia   . BPH (benign prostatic hypertrophy)   . Pulmonary embolism     Diagnosed 7/12  . Deep venous thrombosis     Left leg, diagnosed 7/12  . Osteoarthritis   . Diverticulitis   . Acute cholecystitis 12/12/2012 to 12/19/2012    drainage tube placed, hosptalizred x 1 week, then nursing home   Past Surgical History  Procedure Laterality Date  . Hernia repair    . Neck fusion    . Carpal tunnel release    . Right total knee  replacement  04/23/2011  . Colonoscopy  08/31/2012    Procedure: COLONOSCOPY;  Surgeon: Rogene Houston, MD;  Location: [facility];  Service: Endoscopy;  Laterality: N/A;  32   Family History  Problem Relation Age of Onset  . Cancer Mother     Stomach  . Diabetes Sister   . Hypertension Sister   . Diabetes Brother   . Cancer Brother     Gallbladder   History  Substance Use Topics  . Smoking status: Never Smoker   . Smokeless tobacco: Never Used  . Alcohol Use: No  lives at home Lives with spouse  Review of Systems  Constitutional: Negative for fever.  HENT: Negative for rhinorrhea and sore throat.   Respiratory: Positive for cough.   Cardiovascular: Negative for chest pain and leg swelling.  Gastrointestinal: Negative for vomiting and diarrhea.  All other systems reviewed and are negative.  Allergies  Review of patient's allergies indicates no known allergies.  Home Medications   Prior to Admission medications   Medication Sig Start Date End Date Taking? Authorizing Provider  acetaminophen (TYLENOL) 325 MG tablet Take 650 mg by mouth every 6 (six) hours as needed. Pain.    Historical Provider, MD  aspirin EC 81 MG tablet Take 1 tablet (81 mg total) by mouth daily. 06/19/13 06/19/14  [name], MD  benazepril (LOTENSIN) 5 MG tablet TAKE ONE (1) TABLET EACH DAY 04/03/14   [name], MD  clobetasol cream (TEMOVATE) 4.09 % Apply 1 application topically 2 (two) times daily. 10/22/13   [name], MD  donepezil (ARICEPT) 10 MG tablet TAKE ONE TABLET DAILY AT BEDTIME 05/07/14   [name], MD  ferrous sulfate 325 (65 FE) MG tablet TAKE ONE TABLET BY MOUTH TWICE DAILY 05/07/14   [name], MD  fluticasone [facility]) 50 MCG/ACT nasal spray Place 2 sprays into both nostrils daily. 01/31/14   [name], MD  glipiZIDE (GLUCOTROL XL) 10 MG 24 hr tablet Take 1 tablet (10 mg total) by mouth daily with breakfast. 05/07/14   [name], MD   glipiZIDE (GLUCOTROL XL) 5 MG 24 hr tablet Take 1 tablet (5 mg total) by mouth daily with breakfast. 04/17/14   [name], MD  lovastatin (MEVACOR) 40 MG tablet TAKE ONE TABLET DAILY AT BEDTIME 05/07/14   [name], MD  sertraline (ZOLOFT) 50 MG tablet TAKE ONE (1) TABLET EACH DAY 05/07/14   [name], MD  silodosin (RAPAFLO) 8 MG CAPS capsule Take 1 capsule (8 mg total) by mouth daily with breakfast. 09/06/12   [name], MD  traMADol (ULTRAM) 50 MG tablet Take 1 tablet (50 mg total) by mouth 2 (two) times daily. 10/22/13 10/22/14  [name], MD  Vitamin D, Ergocalciferol, (DRISDOL) 50000 UNITS CAPS capsule TAKE 1 CAPSULE EVERY WEEK 04/26/14   [name], MD   BP 137/76  Pulse 54  Temp(Src) 98.1 F (36.7 C) (Oral)  Resp 18  Ht 5\' 9"  (1.753 m)  Wt 250 lb (113.399 kg)  BMI 36.90 kg/m2  SpO2 98%  Vital signs normal except bradycardia   Physical Exam  Nursing note and vitals reviewed. Constitutional: He is oriented to person, place, and time. He appears well-developed and well-nourished.  Non-toxic appearance. He does not appear ill. No distress.  HENT:  Head: Normocephalic and atraumatic.  Right Ear: External ear normal.  Left Ear: External ear normal.  Nose: Nose normal. No mucosal edema or rhinorrhea.  Mouth/Throat: Oropharynx is clear and moist and mucous membranes are normal. No dental abscesses or uvula swelling.  Eyes: Conjunctivae and EOM are normal. Pupils are equal, round, and reactive to light.  Neck: Normal range of motion and full passive range of motion without pain. Neck supple.  Cardiovascular: Normal rate, regular rhythm and normal heart sounds.  Exam reveals no gallop and no friction rub.   No murmur heard. Pulmonary/Chest: Effort normal and breath sounds normal. No respiratory distress. He has no wheezes. He has no rhonchi. He has no rales. He exhibits no tenderness and no crepitus.  Abdominal: Soft. Normal appearance  and bowel sounds are normal. He exhibits no distension. There is no tenderness. There is no rebound and no guarding.  Musculoskeletal: Normal range of motion. He exhibits edema. He exhibits no tenderness.  Trace pitting edema bilaterally. Moves all extremities well.   Neurological: He is alert and oriented to person, place, and time. He has normal strength. No cranial nerve deficit.  Skin: Skin is warm, dry and intact. No rash noted. No erythema. No pallor.  Psychiatric: He has a normal mood and affect. His speech is normal and behavior is normal. His mood appears not anxious.   ED Course  Procedures  Medications  vancomycin (VANCOCIN) IVPB 1000 mg/200 mL premix (1,000 mg  Intravenous New Bag/Given 05/09/14 1104)  piperacillin-tazobactam (ZOSYN) IVPB 3.375 g (not administered)     DIAGNOSTIC STUDIES: Oxygen Saturation is 98% on RA, normal by my interpretation.    COORDINATION OF CARE: 10:13 AM-Discussed treatment plan which includes CXR and orthostatic vs. Pt agreed to plan.   Pt states he had xrays done on 6/9, however I can see the order, but the CXR was not done. Pt did have an [place] of his neck.  Nurses report when they ambulated patient his pulse ox dropped to 84% on RA from baseline of 96%.  Patient's chest x-ray shows worsening of his prior pneumonia. Patient has failed outpatient treatment of his community-acquired pneumonia.  Patient's chest x-ray was reviewed with patient and his family. He was started on healthcare associated pneumonia antibiotics for failed outpatient treatment of community-acquired pneumonia  10:59 AM- Informed pt of CXR, worsened pneumonia to R lung. Will begin IV antibiotics and will have pt admitted due to low O2 stats.  11:56 Dr Karleen Hampshire admit to med-surg, team 3  Labs Review Results for orders placed during the hospital encounter of 05/09/14  CBC WITH DIFFERENTIAL      Result Value Ref Range   WBC 3.9 (*) 4.0 - 10.5 K/uL   RBC 3.81 (*) 4.22 - 5.81  MIL/uL   Hemoglobin 11.9 (*) 13.0 - 17.0 g/dL   HCT 34.6 (*) 39.0 - 52.0 %   MCV 90.8  78.0 - 100.0 fL   MCH 31.2  26.0 - 34.0 pg   MCHC 34.4  30.0 - 36.0 g/dL   RDW 13.3  11.5 - 15.5 %   Platelets 166  150 - 400 K/uL   Neutrophils Relative % 45  43 - 77 %   Neutro Abs 1.8  1.7 - 7.7 K/uL   Lymphocytes Relative 43  12 - 46 %   Lymphs Abs 1.7  0.7 - 4.0 K/uL   Monocytes Relative 9  3 - 12 %   Monocytes Absolute 0.3  0.1 - 1.0 K/uL   Eosinophils Relative 3  0 - 5 %   Eosinophils Absolute 0.1  0.0 - 0.7 K/uL   Basophils Relative 0  0 - 1 %   Basophils Absolute 0.0  0.0 - 0.1 K/uL  COMPREHENSIVE METABOLIC PANEL      Result Value Ref Range   Sodium 138  137 - 147 mEq/L   Potassium 4.5  3.7 - 5.3 mEq/L   Chloride 101  96 - 112 mEq/L   CO2 28  19 - 32 mEq/L   Glucose, Bld 180 (*) 70 - 99 mg/dL   BUN 13  6 - 23 mg/dL   Creatinine, Ser 1.59 (*) 0.50 - 1.35 mg/dL   Calcium 9.0  8.4 - 10.5 mg/dL   Total Protein 7.3  6.0 - 8.3 g/dL   Albumin 3.3 (*) 3.5 - 5.2 g/dL   AST 11  0 - 37 U/L   ALT 7  0 - 53 U/L   Alkaline Phosphatase 68  39 - 117 U/L   Total Bilirubin 0.9  0.3 - 1.2 mg/dL   GFR calc non Af Amer 39 (*) >90 mL/min   GFR calc Af Amer 45 (*) >90 mL/min    Laboratory interpretation all normal except low total WBC without neutropenia    Imaging Review Dg Chest 2 View  05/09/2014   CLINICAL DATA:  Cough and weakness, recent pneumonia  EXAM: CHEST  2 VIEW  COMPARISON:  PA and lateral chest of  Apr 11, 2014.  FINDINGS: The lungs remain mildly hyperinflated. The interstitial markings remain coarse in the lower lung zones. There is increased conspicuity of retrocardiac density on the lateral film. This is accentuated by overlying anterior endplate osteophyte and calcification of the anterior longitudinal ligament. Partial obscuration of the hemidiaphragms persists. The cardiac silhouette remains top-normal in size. The pulmonary vascularity is not engorged. There is stable tortuosity of  the descending thoracic aorta. The bony thorax exhibits no acute abnormality.  IMPRESSION: There has not been dramatic interval change in the appearance of the chest since the previous study. Persistent and/or progressive superimposed interstitial pneumonia may be present. If the patient's symptoms are worsening, chest CT scanning now may be useful.   Electronically Signed   By: David  Martinique   On: 05/09/2014 10:36   14:33     EKG Interpretation None     MDM   Final diagnoses:  Community acquired pneumonia  Hypoxia   Plan admission  Rolland Porter, MD, FACEP   I personally performed the services described in this documentation, which was scribed in my presence. The recorded information has been reviewed and considered.  Rolland Porter, MD, Abram Sander    Janice Norrie, MD 05/09/14 [phone]

## 2014-05-09 NOTE — H&P (Addendum)
[facility] History and Physical  Samuel Ryan OAC:[id number] DOB: 1931-07-14 DOA: 05/09/2014  Referring physician: EDP PCP: Tula Nakayama, MD   Chief Complaint: worsening sob , FAILED OUTPATIENT TREATMENT FOR PNA.   HPI: Samuel Ryan is a 80 y.o. male with type 2 DM, hypertension, PE, DVT , dementia, comes in for worsening sob and productive cough since one week. He was started on z pack for pneumonia one week ago and his symptoms have not improved after one week. His CXR in ED, showed worsening of his pneumonia. He was then referred to medical service for admission for failed outpatient treatment for pneumonia. He denies any other complaints. In ED he was given a dose of vanco and zosyn after which he developed upper lip swelling, both the antibiotics were discontinued and he was started on IV levaquin.     Review of Systems:  See HPI otherweise negative. .   Past Medical History  Diagnosis Date  . Type 2 diabetes mellitus   . Mixed hyperlipidemia   . Essential hypertension, benign   . Dementia   . BPH (benign prostatic hypertrophy)   . Pulmonary embolism     Diagnosed 7/12  . Deep venous thrombosis     Left leg, diagnosed 7/12  . Osteoarthritis   . Diverticulitis   . Acute cholecystitis 12/12/2012 to 12/19/2012    drainage tube placed, hosptalizred x 1 week, then nursing home   Past Surgical History  Procedure Laterality Date  . Hernia repair    . Neck fusion    . Carpal tunnel release    . Right total knee replacement  04/23/2011  . Colonoscopy  08/31/2012    Procedure: COLONOSCOPY;  Surgeon: Rogene Houston, MD;  Location: [facility];  Service: Endoscopy;  Laterality: N/A;  830   Social History:  reports that he has never smoked. He has never used smokeless tobacco. He reports that he does not drink alcohol or use illicit drugs.  No Known Allergies  Family History  Problem Relation Age of Onset  . Cancer Mother     Stomach  . Diabetes Sister   .  Hypertension Sister   . Diabetes Brother   . Cancer Brother     Gallbladder     Prior to Admission medications   Medication Sig Start Date End Date Taking? Authorizing Provider  acetaminophen (TYLENOL) 500 MG tablet Take 1,000 mg by mouth every 6 (six) hours as needed for moderate pain.   Yes Historical Provider, MD  aspirin EC 81 MG tablet Take 1 tablet (81 mg total) by mouth daily. 06/19/13 06/19/14 Yes [name], MD  benazepril (LOTENSIN) 5 MG tablet TAKE ONE (1) TABLET EACH DAY 04/03/14  Yes [name], MD  clobetasol cream (TEMOVATE) 0.10 % Apply 1 application topically 2 (two) times daily. 10/22/13  Yes [name], MD  donepezil (ARICEPT) 10 MG tablet TAKE ONE TABLET DAILY AT BEDTIME 05/07/14  Yes [name], MD  ferrous sulfate 325 (65 FE) MG tablet TAKE ONE TABLET BY MOUTH TWICE DAILY 05/07/14  Yes [name], MD  fluticasone [facility]) 50 MCG/ACT nasal spray Place 2 sprays into both nostrils daily. 01/31/14  Yes [name], MD  glipiZIDE (GLUCOTROL XL) 10 MG 24 hr tablet Take 1 tablet (10 mg total) by mouth daily with breakfast. 05/07/14  Yes [name], MD  lovastatin (MEVACOR) 40 MG tablet TAKE ONE TABLET DAILY AT BEDTIME 05/07/14  Yes [name],  MD  sertraline (ZOLOFT) 50 MG tablet TAKE ONE (1) TABLET EACH DAY 05/07/14  Yes [name], MD  silodosin (RAPAFLO) 8 MG CAPS capsule Take 1 capsule (8 mg total) by mouth daily with breakfast. 09/06/12  Yes [name], MD  traMADol (ULTRAM) 50 MG tablet Take 1 tablet (50 mg total) by mouth 2 (two) times daily. 10/22/13 10/22/14 Yes [name], MD  Vitamin D, Ergocalciferol, (DRISDOL) 50000 UNITS CAPS capsule TAKE 1 CAPSULE EVERY WEEK 04/26/14  Yes [name], MD   Physical Exam: Filed Vitals:   05/09/14 1200  BP: 144/75  Pulse: 55  Temp:   Resp: 15    BP 144/75  Pulse 55  Temp(Src) 98.1 F (36.7 C) (Oral)  Resp 15  Ht 5\' 9"  (1.753 m)  Wt 113.399 kg  (250 lb)  BMI 36.90 kg/m2  SpO2 92%  General:  Appears calm and comfortable Eyes: PERRL, normal lids, irises & conjunctiva Cardiovascular: RRR, no m/r/g. No LE edema. Respiratory: CTA bilaterally, no w/r/r. Normal respiratory effort. Abdomen: soft, ntnd Skin: no rash or induration seen on limited exam Musculoskeletal: grossly normal tone BUE/BLE Neurologic: grossly non-focal.          Labs on Admission:  Basic Metabolic Panel:  Recent Labs Lab 05/09/14 1104  NA 138  K 4.5  CL 101  CO2 28  GLUCOSE 180*  BUN 13  CREATININE 1.59*  CALCIUM 9.0   Liver Function Tests:  Recent Labs Lab 05/09/14 1104  AST 11  ALT 7  ALKPHOS 68  BILITOT 0.9  PROT 7.3  ALBUMIN 3.3*   No results found for this basename: LIPASE, AMYLASE,  in the last 168 hours No results found for this basename: AMMONIA,  in the last 168 hours CBC:  Recent Labs Lab 05/09/14 1104  WBC 3.9*  NEUTROABS 1.8  HGB 11.9*  HCT 34.6*  MCV 90.8  PLT 166   Cardiac Enzymes: No results found for this basename: CKTOTAL, CKMB, CKMBINDEX, TROPONINI,  in the last 168 hours  BNP (last 3 results) No results found for this basename: PROBNP,  in the last 8760 hours CBG: No results found for this basename: GLUCAP,  in the last 168 hours  Radiological Exams on Admission: Dg Chest 2 View  05/09/2014   CLINICAL DATA:  Cough and weakness, recent pneumonia  EXAM: CHEST  2 VIEW  COMPARISON:  PA and lateral chest of Apr 11, 2014.  FINDINGS: The lungs remain mildly hyperinflated. The interstitial markings remain coarse in the lower lung zones. There is increased conspicuity of retrocardiac density on the lateral film. This is accentuated by overlying anterior endplate osteophyte and calcification of the anterior longitudinal ligament. Partial obscuration of the hemidiaphragms persists. The cardiac silhouette remains top-normal in size. The pulmonary vascularity is not engorged. There is stable tortuosity of the descending  thoracic aorta. The bony thorax exhibits no acute abnormality.  IMPRESSION: There has not been dramatic interval change in the appearance of the chest since the previous study. Persistent and/or progressive superimposed interstitial pneumonia may be present. If the patient's symptoms are worsening, chest CT scanning now may be useful.   Electronically Signed   By: David  Martinique   On: 05/09/2014 10:36   US Soft Tissue Head/neck  05/07/2014   CLINICAL DATA:  Thyroid mass/goiter there is a dominant solid nodule in the lower pole of the right thyroid gland which measures 2.8 x 2.1 x 2.4 cm. Previously, this measured 1.2 x 1.3 x 1.6  cm.  EXAM: THYROID ULTRASOUND  TECHNIQUE: Ultrasound examination of the thyroid gland and adjacent soft tissues was performed.  COMPARISON:  Prior thyroid ultrasound 08/14/2010  FINDINGS: Right thyroid lobe  Measurements: 8.2 x 2.9 x 2.1 cm (previously 5.0 x 2.0 x 1.9 cm). Interval enlargement of the markedly heterogeneous thyroid gland. Enlarging heterogeneous solid nodule in the mid and lower pole measures 2.8 x 2.1 x 2.4 cm today compared to 1.2 x 1.3 x 1.6 cm on 08/14/2010.  Left thyroid lobe  Measurements: 8.3 x 3.6 x 3.2 cm (previously 6.6 x 2.8 x 3.1 cm). Diffusely heterogeneous thyroid gland. There is a dominant 4.2 x 3.6 x 3.8 cm solid nodule taking up the majority of the mid and lower pole. Previously, this was measured at 3.6 x 2.1 x 3.7 cm. Just superior to the dominant mass is a 2.7 x 2.3 x 1.4 cm complex solid nodule. This previously measured 1.8 x 1.1 x 2.0 cm.  Isthmus  Thickness: 0.5 cm.  No nodules visualized.  Lymphadenopathy  None visualized.  IMPRESSION: Compared to 08/14/2010, there has been diffuse overall enlargement of the thyroid gland as well as enlargement of the 3 previously identified thyroid nodules (1 on the right, and 2 on the left). Given the overall slow interval enlargement of all structures this is most consistent with multinodular goiter.  Strictly  speaking, all 3 of the nodules meet consensus criteria for FNA biopsy. Findings meet consensus criteria for biopsy. Ultrasound-guided fine needle aspiration should be considered, as per the consensus statement: Management of Thyroid Nodules Detected at [place]: Society of Radiologists in [place]. Radiology 2005; [id number]. However, given the relatively slow growth over the past 4 years, continued observation may be a reasonable approach.   Electronically Signed   By: Jacqulynn Cadet M.D.   On: 05/07/2014 14:33    EKG: not done.   Assessment/Plan Active Problems:   CAP (community acquired pneumonia)   Pneumonia   1.   Community acquired pneumonia: - admitted to inpatient.  - started pt on IV vancomycini and IV zosyn. But patient developed upper lip swelling. Hence we will discontinue vancomycin and zosyn and start him on IV levaquin.  - blood cultures were not drawn on admission.  - get sputum cultures and urine for legionella and strepto antigen.   2. Diabetes mellitus: - SSI.   3. Hypertension: Controlled . Resume home medications.   4. Dementia: Resume home meds.   3. DVT prophylaxis.   Code Status full code.  Family Communication: family at bedside Disposition Plan: admit to inpatient Time spent: 65 minutes.   [facility] Pager [phone]  **Disclaimer: This note may have been dictated with voice recognition software. Similar sounding words can inadvertently be transcribed and this note may contain transcription errors which may not have been corrected upon publication of note.**

## 2014-05-09 NOTE — ED Notes (Signed)
During transport to room 337.  Pt developed blister to upper lip, jaw and forehead.  Pt talking and laughing, no difficulty breathing or SOB.  Dr Tomi Bamberger informed and instructed to transfer and report to receiving MD.  Gershon Cull, RN informed and paged admitting MD.

## 2014-05-09 NOTE — ED Notes (Signed)
Complain of cough. States he recently had pneumonia

## 2014-05-09 NOTE — Progress Notes (Addendum)
Patient arrived on unit from the ED.  The middle of patient's top lip, area on right cheek and small area above his right eye is swollen.  Nurse, Daphyne, from the ED said that it just started and that patient had just finished receiving Zosyn.  Patient denies difficulty breathing and tongue is not swollen.  Patient alert and oriented.  Patient's wife at bedside.  Dr. Karleen Hampshire paged.

## 2014-05-09 NOTE — Progress Notes (Signed)
ANTIBIOTIC CONSULT NOTE - INITIAL  Pharmacy Consult for Levaquin Indication: pneumonia  No Known Allergies  Patient Measurements: Height: 5\' 10"  (177.8 cm) Weight: 250 lb (113.399 kg) IBW/kg (Calculated) : 73  Vital Signs: Temp: 98.4 F (36.9 C) (06/11 1336) Temp src: Oral (06/11 1336) BP: 128/57 mmHg (06/11 1336) Pulse Rate: 56 (06/11 1336) Intake/Output from previous day:   Intake/Output from this shift:    Labs:  Recent Labs  05/09/14 1104  WBC 3.9*  HGB 11.9*  PLT 166  CREATININE 1.59*   Estimated Creatinine Clearance: 46 ml/min (by C-G formula based on Cr of 1.59). No results found for this basename: VANCOTROUGH, VANCOPEAK, VANCORANDOM, GENTTROUGH, GENTPEAK, GENTRANDOM, TOBRATROUGH, TOBRAPEAK, TOBRARND, AMIKACINPEAK, AMIKACINTROU, AMIKACIN,  in the last 72 hours   Microbiology: No results found for this or any previous visit (from the past 720 hour(s)).  Medical History: Past Medical History  Diagnosis Date  . Type 2 diabetes mellitus   . Mixed hyperlipidemia   . Essential hypertension, benign   . Dementia   . BPH (benign prostatic hypertrophy)   . Pulmonary embolism     Diagnosed 7/12  . Deep venous thrombosis     Left leg, diagnosed 7/12  . Osteoarthritis   . Diverticulitis   . Acute cholecystitis 12/12/2012 to 12/19/2012    drainage tube placed, hosptalizred x 1 week, then nursing home   Vancomycin 6/11 x 1 Zosyn 6/11 x 1 Levaquin 6/11 >>  Assessment: 80yo male admitted for suspected pna.  Asked to initiate Levaquin for CAP.  Normalized ClCr 35-23ml/hr  Goal of Therapy:  Eradicate infection.  Plan:  Levaquin 750mg  IV q48hrs Switch to PO when improved Monitor labs, renal fxn, progress, and cultures  Hart Robinsons A 05/09/2014,2:21 PM

## 2014-05-09 NOTE — Progress Notes (Addendum)
Dr. Karleen Hampshire returned paged.  Stated that she would speak with the pharmacy to determine which antibiotics patient can take.  Stated that it was probably  Zosyn related.  IV tubing changed.  NS at [facility] infusing.

## 2014-05-10 LAB — GLUCOSE, CAPILLARY
GLUCOSE-CAPILLARY: 256 mg/dL — AB (ref 70–99)
GLUCOSE-CAPILLARY: 413 mg/dL — AB (ref 70–99)
Glucose-Capillary: 258 mg/dL — ABNORMAL HIGH (ref 70–99)
Glucose-Capillary: 177 mg/dL — ABNORMAL HIGH (ref 70–99)

## 2014-05-10 LAB — CBC
HCT: 37.5 % — ABNORMAL LOW (ref 39.0–52.0)
Hemoglobin: 13.3 g/dL (ref 13.0–17.0)
MCH: 31.4 pg (ref 26.0–34.0)
MCHC: 35.5 g/dL (ref 30.0–36.0)
MCV: 88.4 fL (ref 78.0–100.0)
Platelets: 178 10*3/uL (ref 150–400)
RBC: 4.24 MIL/uL (ref 4.22–5.81)
RDW: 13.1 % (ref 11.5–15.5)
WBC: 7.2 10*3/uL (ref 4.0–10.5)

## 2014-05-10 LAB — HIV ANTIBODY (ROUTINE TESTING W REFLEX): HIV 1&2 Ab, 4th Generation: NONREACTIVE

## 2014-05-10 LAB — BASIC METABOLIC PANEL
BUN: 18 mg/dL (ref 6–23)
CO2: 22 meq/L (ref 19–32)
Calcium: 9.2 mg/dL (ref 8.4–10.5)
Chloride: 100 mEq/L (ref 96–112)
Creatinine, Ser: 1.51 mg/dL — ABNORMAL HIGH (ref 0.50–1.35)
GFR calc Af Amer: 48 mL/min — ABNORMAL LOW (ref >90)
GFR calc non Af Amer: 42 mL/min — ABNORMAL LOW (ref >90)
Glucose, Bld: 292 mg/dL — ABNORMAL HIGH (ref 70–99)
Potassium: 4.7 mEq/L (ref 3.7–5.3)
Sodium: 137 mEq/L (ref 137–147)

## 2014-05-10 LAB — GLUCOSE, RANDOM: Glucose, Bld: 386 mg/dL — ABNORMAL HIGH (ref 70–99)

## 2014-05-10 LAB — STREP PNEUMONIAE URINARY ANTIGEN: Strep Pneumo Urinary Antigen: NEGATIVE

## 2014-05-10 MED ORDER — ZOLPIDEM TARTRATE 5 MG PO TABS
5.0000 mg | ORAL_TABLET | Freq: Once | ORAL | Status: AC
  Administered 2014-05-10: 5 mg via ORAL
  Filled 2014-05-10: qty 1

## 2014-05-10 MED ORDER — INSULIN ASPART 100 UNIT/ML ~~LOC~~ SOLN
0.0000 [IU] | Freq: Three times a day (TID) | SUBCUTANEOUS | Status: DC
  Administered 2014-05-10: 15 [IU] via SUBCUTANEOUS
  Administered 2014-05-10: 8 [IU] via SUBCUTANEOUS
  Administered 2014-05-11: 3 [IU] via SUBCUTANEOUS
  Administered 2014-05-11: 8 [IU] via SUBCUTANEOUS

## 2014-05-10 NOTE — Progress Notes (Signed)
Notified Dr. Karleen Hampshire about the patients CBG greater than 400.  New orders given and followed.

## 2014-05-10 NOTE — Progress Notes (Signed)
Inpatient Diabetes Program Recommendations  AACE/ADA: New Consensus Statement on Inpatient Glycemic Control (2013)  Target Ranges:  Prepandial:   less than 140 mg/dL      Peak postprandial:   less than 180 mg/dL (1-2 hours)      Critically ill patients:  140 - 180 mg/dL  Results for AMIIR, HECKARD (MRN [id number]) as of 05/10/2014 12:43  Ref. Range 05/09/2014 17:29 05/09/2014 21:16 05/10/2014 07:23 05/10/2014 11:40  Glucose-Capillary Latest Range: 70-99 mg/dL 142 (H) 356 (H) 256 (H) 413 (H)   Inpatient Diabetes Program Recommendations Correction (SSI): Increase to RESISTANT q 4 during steroid therapy Thank you  Raoul Pitch BSN, RN,CDE Inpatient Diabetes Coordinator [phone] (team pager)

## 2014-05-10 NOTE — Care Management Utilization Note (Signed)
UR completed 

## 2014-05-10 NOTE — Progress Notes (Signed)
[facility] PROGRESS NOTE  Samuel Ryan JHE:[id number] DOB: Aug 30, 1931 DOA: 05/09/2014 PCP: Tula Nakayama, MD  Assessment/Plan: Community acquired pneumonia:  - admitted to inpatient.  - started pt on IV vancomycini and IV zosyn. But patient developed upper lip swelling. Hence we discontinued vancomycin and zosyn and started him on IV levaquin.  - blood cultures were not drawn on admission.  - get sputum cultures and urine for legionella and strepto antigen negative.  2. Diabetes mellitus:  - SSI.  CBG (last 3)   Recent Labs  05/10/14 0723 05/10/14 1140 05/10/14 1626  GLUCAP 256* 413* 258*    Changed to moderate scale correction, and carb modified diet.   3. Hypertension:  Controlled . Resume home medications.  4. Dementia:  Resume home meds.   5. Stage 2 CKD: - Appears at baseline.    DVT prophylaxis.    Code Status: full code.  Family Communication: family at bedside.  Disposition Plan: possibly tomorrow discharge.    Consultants:  None   Procedures:  CT chest.   Antibiotics:  levaquin 6/11  HPI/Subjective: Feel better, cough is better.   Objective: Filed Vitals:   05/10/14 1451  BP: 123/72  Pulse: 84  Temp:   Resp: 20    Intake/Output Summary (Last 24 hours) at 05/10/14 1713 Last data filed at 05/10/14 1300  Gross per 24 hour  Intake    480 ml  Output      0 ml  Net    480 ml   Filed Weights   05/09/14 0949  Weight: 113.399 kg (250 lb)    Exam:   General:  Alert afebrile comfortable   Cardiovascular: s1s2  Respiratory: ctab  Abdomen: soft NT nd bs+  Musculoskeletal: no pedal edema.  Data Reviewed: Basic Metabolic Panel:  Recent Labs Lab 05/09/14 1104 05/10/14 0636 05/10/14 1300  NA 138 137  --   K 4.5 4.7  --   CL 101 100  --   CO2 28 22  --   GLUCOSE 180* 292* 386*  BUN 13 18  --   CREATININE 1.59* 1.51*  --   CALCIUM 9.0 9.2  --    Liver Function Tests:  Recent Labs Lab 05/09/14 1104  AST 11   ALT 7  ALKPHOS 68  BILITOT 0.9  PROT 7.3  ALBUMIN 3.3*   No results found for this basename: LIPASE, AMYLASE,  in the last 168 hours No results found for this basename: AMMONIA,  in the last 168 hours CBC:  Recent Labs Lab 05/09/14 1104 05/10/14 0636  WBC 3.9* 7.2  NEUTROABS 1.8  --   HGB 11.9* 13.3  HCT 34.6* 37.5*  MCV 90.8 88.4  PLT 166 178   Cardiac Enzymes: No results found for this basename: CKTOTAL, CKMB, CKMBINDEX, TROPONINI,  in the last 168 hours BNP (last 3 results) No results found for this basename: PROBNP,  in the last 8760 hours CBG:  Recent Labs Lab 05/09/14 1729 05/09/14 2116 05/10/14 0723 05/10/14 1140 05/10/14 1626  GLUCAP 142* 356* 256* 413* 258*    No results found for this or any previous visit (from the past 240 hour(s)).   Studies: Dg Chest 2 View  05/09/2014   CLINICAL DATA:  Cough and weakness, recent pneumonia  EXAM: CHEST  2 VIEW  COMPARISON:  PA and lateral chest of Apr 11, 2014.  FINDINGS: The lungs remain mildly hyperinflated. The interstitial markings remain coarse in the lower lung zones. There is increased conspicuity of retrocardiac  density on the lateral film. This is accentuated by overlying anterior endplate osteophyte and calcification of the anterior longitudinal ligament. Partial obscuration of the hemidiaphragms persists. The cardiac silhouette remains top-normal in size. The pulmonary vascularity is not engorged. There is stable tortuosity of the descending thoracic aorta. The bony thorax exhibits no acute abnormality.  IMPRESSION: There has not been dramatic interval change in the appearance of the chest since the previous study. Persistent and/or progressive superimposed interstitial pneumonia may be present. If the patient's symptoms are worsening, chest CT scanning now may be useful.   Electronically Signed   By: David  Martinique   On: 05/09/2014 10:36   Ct Chest W Contrast  05/09/2014   CLINICAL DATA:  Abnormal chest x-ray.  Failed outpatient pneumonia treatment.  EXAM: CT CHEST WITH CONTRAST  TECHNIQUE: Multidetector CT imaging of the chest was performed during intravenous contrast administration.  CONTRAST:  32mL OMNIPAQUE IOHEXOL 300 MG/ML  SOLN  COMPARISON:  Chest x-ray 05/09/2014 and 04/11/14. CT chest 12/01/2012.  FINDINGS: The thyroid is heterogeneously enlarged. It was recently evaluated by ultrasound. No significant mediastinal or axillary adenopathy is present. The heart size is normal. Limited imaging of the upper abdomen demonstrates a stable appearance of the left renal cyst. No significant pleural or pericardial effusion is evident.  Scarring and interlobular thickening at the lung bases bilaterally is similar to the prior study. There is no significant airspace consolidation. No new airspace disease is present. Atelectasis is increased.  The bone windows demonstrate endplate degenerative changes and fusion compatible with DISH. No focal lytic or blastic lesions are evident.  IMPRESSION: 1. Interlobular thickening and scarring is stable since prior exam. 2. Dependent atelectasis has increased. 3. No significant focal airspace consolidation is evident. 4. Thyroid goiter. 5. Left renal cyst. 6. Degenerative changes of the thoracic spine with exaggerated kyphosis and DISH.   Electronically Signed   By: Lawrence Santiago M.D.   On: 05/09/2014 17:29    Scheduled Meds: . aspirin EC  81 mg Oral Daily  . clobetasol ointment  1 application Topical BID  . donepezil  10 mg Oral QHS  . enoxaparin (LOVENOX) injection  55 mg Subcutaneous Q24H  . ferrous sulfate  325 mg Oral BID WC  . fluticasone  2 spray Each Nare Daily  . insulin aspart  0-15 Units Subcutaneous TID WC  . insulin aspart  0-5 Units Subcutaneous QHS  . levofloxacin (LEVAQUIN) IV  750 mg Intravenous Q48H  . sertraline  50 mg Oral Daily  . tamsulosin  0.4 mg Oral QPC supper  . traMADol  50 mg Oral BID   Continuous Infusions:   Active Problems:   DM   CAP  (community acquired pneumonia)   Pneumonia    Time spent: 37 min    [facility]  [facility] Pager [phone]. If 7PM-7AM, please contact night-coverage at [email or web address], password [facility] 05/10/2014, 5:13 PM  LOS: 1 day

## 2014-05-11 LAB — CBC
HEMATOCRIT: 35.6 % — AB (ref 39.0–52.0)
Hemoglobin: 12.5 g/dL — ABNORMAL LOW (ref 13.0–17.0)
MCH: 31.6 pg (ref 26.0–34.0)
MCHC: 35.1 g/dL (ref 30.0–36.0)
MCV: 89.9 fL (ref 78.0–100.0)
Platelets: 170 10*3/uL (ref 150–400)
RBC: 3.96 MIL/uL — ABNORMAL LOW (ref 4.22–5.81)
RDW: 13.4 % (ref 11.5–15.5)
WBC: 8 10*3/uL (ref 4.0–10.5)

## 2014-05-11 LAB — BASIC METABOLIC PANEL
BUN: 22 mg/dL (ref 6–23)
CO2: 26 mEq/L (ref 19–32)
CREATININE: 1.76 mg/dL — AB (ref 0.50–1.35)
Calcium: 8.7 mg/dL (ref 8.4–10.5)
Chloride: 100 mEq/L (ref 96–112)
GFR calc Af Amer: 40 mL/min — ABNORMAL LOW (ref >90)
GFR, EST NON AFRICAN AMERICAN: 35 mL/min — AB (ref >90)
Glucose, Bld: 212 mg/dL — ABNORMAL HIGH (ref 70–99)
Potassium: 4.2 mEq/L (ref 3.7–5.3)
Sodium: 138 mEq/L (ref 137–147)

## 2014-05-11 LAB — LEGIONELLA ANTIGEN, URINE: Legionella Antigen, Urine: NEGATIVE

## 2014-05-11 LAB — GLUCOSE, CAPILLARY
GLUCOSE-CAPILLARY: 279 mg/dL — AB (ref 70–99)
Glucose-Capillary: 182 mg/dL — ABNORMAL HIGH (ref 70–99)

## 2014-05-11 MED ORDER — GUAIFENESIN-DM 100-10 MG/5ML PO SYRP: 5.0000 mL | ORAL_SOLUTION | ORAL | Status: DC | PRN

## 2014-05-11 MED ORDER — GUAIFENESIN 100 MG/5ML PO SOLN
5.0000 mL | Freq: Four times a day (QID) | ORAL | Status: DC | PRN
  Administered 2014-05-11: 100 mg via ORAL
  Filled 2014-05-11: qty 5

## 2014-05-11 MED ORDER — GUAIFENESIN-DM 100-10 MG/5ML PO SYRP
5.0000 mL | ORAL_SOLUTION | ORAL | Status: DC | PRN
Start: 2014-05-11 — End: 2014-05-11

## 2014-05-11 MED ORDER — LEVOFLOXACIN 500 MG PO TABS: 500.0000 mg | ORAL_TABLET | Freq: Every day | ORAL | Status: DC

## 2014-05-11 NOTE — Progress Notes (Signed)
Patient received discharge instructions along with follow up appointments and prescriptions. Patient verbalized understanding of all instructions. Patient was escorted by staff via wheelchair to vehicle. Patient discharged to home in stable condition. 

## 2014-05-12 NOTE — Discharge Summary (Signed)
Physician Discharge Summary  Samuel Ryan GMW:[id number] DOB: 05-03-1931 DOA: 05/09/2014  PCP: Tula Nakayama, MD  Admit date: 05/09/2014 Discharge date: 05/11/2014  Time spent: 30 minutes  Recommendations for Outpatient Follow-up:  1. Follow up with PCP in one week 2. Please obtain CXR in one week 3. Follow up with [facility]/RN.  Discharge Diagnoses:  Active Problems:   DM   CAP (community acquired pneumonia)   Pneumonia   Discharge Condition: improved.  Diet recommendation: regular.   Filed Weights   05/09/14 0949  Weight: 113.399 kg (250 lb)    History of present illness:  Samuel Ryan is a 80 y.o. male with type 2 DM, hypertension, PE, DVT , dementia, comes in for worsening sob and productive cough since one week. He was started on z pack for pneumonia one week ago and his symptoms have not improved after one week. His CXR in ED, showed worsening of his pneumonia. He was then referred to medical service for admission for failed outpatient treatment for pneumonia. He denies any other complaints. In ED he was given a dose of vanco and zosyn after which he developed upper lip swelling, both the antibiotics were discontinued and he was started on IV levaquin   Hospital Course:   Community acquired pneumonia:  - admitted to inpatient. Pt was started pt on IV vancomycini and IV zosyn. But patient developed upper lip swelling. Hence we discontinued vancomycin and zosyn and started him on IV levaquin. Later on transitioned to oral levaquin.  - blood cultures were not drawn on admission.  -urine for legionella and strepto antigen negative.  2. Diabetes mellitus:  - SSI.  CBG (last 3)   Recent Labs   05/10/14 0723  05/10/14 1140  05/10/14 1626   GLUCAP  256*  413*  258*    Resume anti diabetic medications on discharge. 3. Hypertension:  Controlled . Resume home medications.  4. Dementia:  Resume home meds.  5. Stage 2 CKD:  - Appears at baseline.    Procedures:  none  Consultations:  none  Discharge Exam: Filed Vitals:   05/11/14 0435  BP: 129/67  Pulse: 55  Temp: 98 F (36.7 C)  Resp: 20    General: ALERT AFEBRILE comfortable Cardiovascular: s1s2 Respiratory: ctab  Discharge Instructions You were cared for by a hospitalist during your hospital stay. If you have any questions about your discharge medications or the care you received while you were in the hospital after you are discharged, you can call the unit and asked to speak with the hospitalist on call if the hospitalist that took care of you is not available. Once you are discharged, your primary care physician will handle any further medical issues. Please note that NO REFILLS for any discharge medications will be authorized once you are discharged, as it is imperative that you return to your primary care physician (or establish a relationship with a primary care physician if you do not have one) for your aftercare needs so that they can reassess your need for medications and monitor your lab values.  Discharge Instructions   Discharge instructions    Complete by:  As directed   FOLLOW UP WITH pcp IN ONE WEEK.            Medication List    STOP taking these medications       benazepril 5 MG tablet  Commonly known as:  LOTENSIN      TAKE these medications  acetaminophen 500 MG tablet  Commonly known as:  TYLENOL  Take 1,000 mg by mouth every 6 (six) hours as needed for moderate pain.     aspirin EC 81 MG tablet  Take 1 tablet (81 mg total) by mouth daily.     clobetasol cream 0.05 %  Commonly known as:  TEMOVATE  Apply 1 application topically 2 (two) times daily.     donepezil 10 MG tablet  Commonly known as:  ARICEPT  TAKE ONE TABLET DAILY AT BEDTIME     ferrous sulfate 325 (65 FE) MG tablet  TAKE ONE TABLET BY MOUTH TWICE DAILY     fluticasone 50 MCG/ACT nasal spray  Commonly known as:  FLONASE  Place 2 sprays into both nostrils  daily.     glipiZIDE 10 MG 24 hr tablet  Commonly known as:  GLUCOTROL XL  Take 1 tablet (10 mg total) by mouth daily with breakfast.     guaiFENesin-dextromethorphan 100-10 MG/5ML syrup  Commonly known as:  ROBITUSSIN DM  Take 5 mLs by mouth every 4 (four) hours as needed for cough.     levofloxacin 500 MG tablet  Commonly known as:  LEVAQUIN  Take 1 tablet (500 mg total) by mouth daily.     lovastatin 40 MG tablet  Commonly known as:  MEVACOR  TAKE ONE TABLET DAILY AT BEDTIME     sertraline 50 MG tablet  Commonly known as:  ZOLOFT  TAKE ONE (1) TABLET EACH DAY     silodosin 8 MG Caps capsule  Commonly known as:  RAPAFLO  Take 1 capsule (8 mg total) by mouth daily with breakfast.     traMADol 50 MG tablet  Commonly known as:  ULTRAM  Take 1 tablet (50 mg total) by mouth 2 (two) times daily.     Vitamin D (Ergocalciferol) 50000 UNITS Caps capsule  Commonly known as:  DRISDOL  TAKE 1 CAPSULE EVERY WEEK       Allergies  Allergen Reactions  . Vancomycin Swelling    Facial swelling  . Zosyn [Piperacillin Sod-Tazobactam So] Swelling    Facial swelling      The results of significant diagnostics from this hospitalization (including imaging, microbiology, ancillary and laboratory) are listed below for reference.    Significant Diagnostic Studies: Dg Chest 2 View  05/09/2014   CLINICAL DATA:  Cough and weakness, recent pneumonia  EXAM: CHEST  2 VIEW  COMPARISON:  PA and lateral chest of Apr 11, 2014.  FINDINGS: The lungs remain mildly hyperinflated. The interstitial markings remain coarse in the lower lung zones. There is increased conspicuity of retrocardiac density on the lateral film. This is accentuated by overlying anterior endplate osteophyte and calcification of the anterior longitudinal ligament. Partial obscuration of the hemidiaphragms persists. The cardiac silhouette remains top-normal in size. The pulmonary vascularity is not engorged. There is stable tortuosity  of the descending thoracic aorta. The bony thorax exhibits no acute abnormality.  IMPRESSION: There has not been dramatic interval change in the appearance of the chest since the previous study. Persistent and/or progressive superimposed interstitial pneumonia may be present. If the patient's symptoms are worsening, chest CT scanning now may be useful.   Electronically Signed   By: David  Martinique   On: 05/09/2014 10:36   Ct Chest W Contrast  05/09/2014   CLINICAL DATA:  Abnormal chest x-ray. Failed outpatient pneumonia treatment.  EXAM: CT CHEST WITH CONTRAST  TECHNIQUE: Multidetector CT imaging of the chest was performed during intravenous contrast  administration.  CONTRAST:  54mL OMNIPAQUE IOHEXOL 300 MG/ML  SOLN  COMPARISON:  Chest x-ray 05/09/2014 and 04/11/14. CT chest 12/01/2012.  FINDINGS: The thyroid is heterogeneously enlarged. It was recently evaluated by ultrasound. No significant mediastinal or axillary adenopathy is present. The heart size is normal. Limited imaging of the upper abdomen demonstrates a stable appearance of the left renal cyst. No significant pleural or pericardial effusion is evident.  Scarring and interlobular thickening at the lung bases bilaterally is similar to the prior study. There is no significant airspace consolidation. No new airspace disease is present. Atelectasis is increased.  The bone windows demonstrate endplate degenerative changes and fusion compatible with DISH. No focal lytic or blastic lesions are evident.  IMPRESSION: 1. Interlobular thickening and scarring is stable since prior exam. 2. Dependent atelectasis has increased. 3. No significant focal airspace consolidation is evident. 4. Thyroid goiter. 5. Left renal cyst. 6. Degenerative changes of the thoracic spine with exaggerated kyphosis and DISH.   Electronically Signed   By: Lawrence Santiago M.D.   On: 05/09/2014 17:29   US Soft Tissue Head/neck  05/07/2014   CLINICAL DATA:  Thyroid mass/goiter there is a  dominant solid nodule in the lower pole of the right thyroid gland which measures 2.8 x 2.1 x 2.4 cm. Previously, this measured 1.2 x 1.3 x 1.6 cm.  EXAM: THYROID ULTRASOUND  TECHNIQUE: Ultrasound examination of the thyroid gland and adjacent soft tissues was performed.  COMPARISON:  Prior thyroid ultrasound 08/14/2010  FINDINGS: Right thyroid lobe  Measurements: 8.2 x 2.9 x 2.1 cm (previously 5.0 x 2.0 x 1.9 cm). Interval enlargement of the markedly heterogeneous thyroid gland. Enlarging heterogeneous solid nodule in the mid and lower pole measures 2.8 x 2.1 x 2.4 cm today compared to 1.2 x 1.3 x 1.6 cm on 08/14/2010.  Left thyroid lobe  Measurements: 8.3 x 3.6 x 3.2 cm (previously 6.6 x 2.8 x 3.1 cm). Diffusely heterogeneous thyroid gland. There is a dominant 4.2 x 3.6 x 3.8 cm solid nodule taking up the majority of the mid and lower pole. Previously, this was measured at 3.6 x 2.1 x 3.7 cm. Just superior to the dominant mass is a 2.7 x 2.3 x 1.4 cm complex solid nodule. This previously measured 1.8 x 1.1 x 2.0 cm.  Isthmus  Thickness: 0.5 cm.  No nodules visualized.  Lymphadenopathy  None visualized.  IMPRESSION: Compared to 08/14/2010, there has been diffuse overall enlargement of the thyroid gland as well as enlargement of the 3 previously identified thyroid nodules (1 on the right, and 2 on the left). Given the overall slow interval enlargement of all structures this is most consistent with multinodular goiter.  Strictly speaking, all 3 of the nodules meet consensus criteria for FNA biopsy. Findings meet consensus criteria for biopsy. Ultrasound-guided fine needle aspiration should be considered, as per the consensus statement: Management of Thyroid Nodules Detected at [place]: Society of Radiologists in [place]. Radiology 2005; [id number]. However, given the relatively slow growth over the past 4 years, continued observation may be a reasonable approach.   Electronically Signed    By: Jacqulynn Cadet M.D.   On: 05/07/2014 14:33    Microbiology: No results found for this or any previous visit (from the past 240 hour(s)).   Labs: Basic Metabolic Panel:  Recent Labs Lab 05/09/14 1104 05/10/14 0636 05/10/14 1300 05/11/14 0507  NA 138 137  --  138  K 4.5 4.7  --  4.2  CL 101  100  --  100  CO2 28 22  --  26  GLUCOSE 180* 292* 386* 212*  BUN 13 18  --  22  CREATININE 1.59* 1.51*  --  1.76*  CALCIUM 9.0 9.2  --  8.7   Liver Function Tests:  Recent Labs Lab 05/09/14 1104  AST 11  ALT 7  ALKPHOS 68  BILITOT 0.9  PROT 7.3  ALBUMIN 3.3*   No results found for this basename: LIPASE, AMYLASE,  in the last 168 hours No results found for this basename: AMMONIA,  in the last 168 hours CBC:  Recent Labs Lab 05/09/14 1104 05/10/14 0636 05/11/14 0507  WBC 3.9* 7.2 8.0  NEUTROABS 1.8  --   --   HGB 11.9* 13.3 12.5*  HCT 34.6* 37.5* 35.6*  MCV 90.8 88.4 89.9  PLT 166 178 170   Cardiac Enzymes: No results found for this basename: CKTOTAL, CKMB, CKMBINDEX, TROPONINI,  in the last 168 hours BNP: BNP (last 3 results) No results found for this basename: PROBNP,  in the last 8760 hours CBG:  Recent Labs Lab 05/10/14 1140 05/10/14 1626 05/10/14 2132 05/11/14 0715 05/11/14 1205  GLUCAP 413* 258* 177* 182* 279*       Signed:  Katonya Blecher  [facility] 05/12/2014, 8:55 PM

## 2014-05-14 ENCOUNTER — Telehealth: Payer: Self-pay | Admitting: Family Medicine

## 2014-05-14 DIAGNOSIS — J189 Pneumonia, unspecified organism: Secondary | ICD-10-CM

## 2014-05-14 NOTE — Telephone Encounter (Signed)
Pls let pt know that i am aware that he was recently hospitalized at [facility] for pneumoinia and that i hope he contoinues to improve. He needs a CXR done next week Wedneday, I have ordered it pls ensure he understands and gets it done ,  pls also plan to review the result with me next week Wednesdayy 6/24, if you will not be available that day, pls pass on to your colleague to follow through on this , this is very  Important Pls also give him an appt to see me on 6/29  ( Afternoon) hospital follow up and put on  The schedule  ?? pls ask  Thank you

## 2014-05-15 NOTE — Telephone Encounter (Signed)
Wife Samuel Ryan) aware of orders for cxr to be done on 6/24 preferably in the am.    Patient will be seen on 6/29 @ 3:30 for hospital followup

## 2014-05-16 ENCOUNTER — Encounter (HOSPITAL_COMMUNITY): Payer: Self-pay | Admitting: Emergency Medicine

## 2014-05-16 ENCOUNTER — Other Ambulatory Visit: Payer: Self-pay

## 2014-05-16 ENCOUNTER — Telehealth: Payer: Self-pay

## 2014-05-16 ENCOUNTER — Emergency Department (HOSPITAL_COMMUNITY)
Admission: EM | Admit: 2014-05-16 | Discharge: 2014-05-16 | Disposition: A | Discharge status: Home / Self Care | Payer: Medicare Other | Attending: Emergency Medicine | Admitting: Emergency Medicine

## 2014-05-16 ENCOUNTER — Emergency Department (HOSPITAL_COMMUNITY): Payer: Medicare Other

## 2014-05-16 DIAGNOSIS — Z7982 Long term (current) use of aspirin: Secondary | ICD-10-CM | POA: Insufficient documentation

## 2014-05-16 DIAGNOSIS — IMO0002 Reserved for concepts with insufficient information to code with codable children: Secondary | ICD-10-CM | POA: Insufficient documentation

## 2014-05-16 DIAGNOSIS — F0390 Unspecified dementia without behavioral disturbance: Secondary | ICD-10-CM | POA: Insufficient documentation

## 2014-05-16 DIAGNOSIS — Z792 Long term (current) use of antibiotics: Secondary | ICD-10-CM | POA: Insufficient documentation

## 2014-05-16 DIAGNOSIS — E782 Mixed hyperlipidemia: Secondary | ICD-10-CM | POA: Insufficient documentation

## 2014-05-16 DIAGNOSIS — I10 Essential (primary) hypertension: Secondary | ICD-10-CM | POA: Insufficient documentation

## 2014-05-16 DIAGNOSIS — R059 Cough, unspecified: Secondary | ICD-10-CM | POA: Insufficient documentation

## 2014-05-16 DIAGNOSIS — E119 Type 2 diabetes mellitus without complications: Secondary | ICD-10-CM | POA: Insufficient documentation

## 2014-05-16 DIAGNOSIS — R05 Cough: Secondary | ICD-10-CM

## 2014-05-16 DIAGNOSIS — Z86711 Personal history of pulmonary embolism: Secondary | ICD-10-CM | POA: Insufficient documentation

## 2014-05-16 DIAGNOSIS — N400 Enlarged prostate without lower urinary tract symptoms: Secondary | ICD-10-CM | POA: Insufficient documentation

## 2014-05-16 DIAGNOSIS — Z8719 Personal history of other diseases of the digestive system: Secondary | ICD-10-CM | POA: Insufficient documentation

## 2014-05-16 DIAGNOSIS — Z86718 Personal history of other venous thrombosis and embolism: Secondary | ICD-10-CM | POA: Insufficient documentation

## 2014-05-16 DIAGNOSIS — Z8701 Personal history of pneumonia (recurrent): Secondary | ICD-10-CM | POA: Insufficient documentation

## 2014-05-16 DIAGNOSIS — Z79899 Other long term (current) drug therapy: Secondary | ICD-10-CM | POA: Insufficient documentation

## 2014-05-16 LAB — COMPREHENSIVE METABOLIC PANEL
ALBUMIN: 3 g/dL — AB (ref 3.5–5.2)
ALK PHOS: 69 U/L (ref 39–117)
ALT: 8 U/L (ref 0–53)
AST: 11 U/L (ref 0–37)
BILIRUBIN TOTAL: 0.8 mg/dL (ref 0.3–1.2)
BUN: 14 mg/dL (ref 6–23)
CHLORIDE: 103 meq/L (ref 96–112)
CO2: 26 meq/L (ref 19–32)
Calcium: 8.7 mg/dL (ref 8.4–10.5)
Creatinine, Ser: 1.56 mg/dL — ABNORMAL HIGH (ref 0.50–1.35)
GFR calc Af Amer: 46 mL/min — ABNORMAL LOW (ref >90)
GFR, EST NON AFRICAN AMERICAN: 40 mL/min — AB (ref >90)
Glucose, Bld: 247 mg/dL — ABNORMAL HIGH (ref 70–99)
POTASSIUM: 4.5 meq/L (ref 3.7–5.3)
Sodium: 138 mEq/L (ref 137–147)
Total Protein: 7.1 g/dL (ref 6.0–8.3)

## 2014-05-16 LAB — PROTIME-INR
INR: 1.11 (ref 0.00–1.49)
Prothrombin Time: 14.1 seconds (ref 11.6–15.2)

## 2014-05-16 LAB — URINALYSIS, ROUTINE W REFLEX MICROSCOPIC
BILIRUBIN URINE: NEGATIVE
GLUCOSE, UA: 250 mg/dL — AB
KETONES UR: NEGATIVE mg/dL
Leukocytes, UA: NEGATIVE
Nitrite: NEGATIVE
PH: 5.5 (ref 5.0–8.0)
PROTEIN: NEGATIVE mg/dL
Specific Gravity, Urine: >1.03 — ABNORMAL HIGH (ref 1.005–1.030)
Urobilinogen, UA: 0.2 mg/dL (ref 0.0–1.0)

## 2014-05-16 LAB — PRO B NATRIURETIC PEPTIDE: Pro B Natriuretic peptide (BNP): 172.9 pg/mL (ref 0–450)

## 2014-05-16 LAB — CBC WITH DIFFERENTIAL/PLATELET
BASOS ABS: 0 10*3/uL (ref 0.0–0.1)
BASOS PCT: 0 % (ref 0–1)
Eosinophils Absolute: 0.1 10*3/uL (ref 0.0–0.7)
Eosinophils Relative: 3 % (ref 0–5)
HCT: 35.3 % — ABNORMAL LOW (ref 39.0–52.0)
Hemoglobin: 12.1 g/dL — ABNORMAL LOW (ref 13.0–17.0)
LYMPHS PCT: 34 % (ref 12–46)
Lymphs Abs: 1.4 10*3/uL (ref 0.7–4.0)
MCH: 31.2 pg (ref 26.0–34.0)
MCHC: 34.3 g/dL (ref 30.0–36.0)
MCV: 91 fL (ref 78.0–100.0)
Monocytes Absolute: 0.5 10*3/uL (ref 0.1–1.0)
Monocytes Relative: 12 % (ref 3–12)
NEUTROS PCT: 51 % (ref 43–77)
Neutro Abs: 2.1 10*3/uL (ref 1.7–7.7)
Platelets: 146 10*3/uL — ABNORMAL LOW (ref 150–400)
RBC: 3.88 MIL/uL — ABNORMAL LOW (ref 4.22–5.81)
RDW: 13.5 % (ref 11.5–15.5)
WBC: 4.2 10*3/uL (ref 4.0–10.5)

## 2014-05-16 LAB — URINE MICROSCOPIC-ADD ON

## 2014-05-16 LAB — I-STAT CG4 LACTIC ACID, ED: LACTIC ACID, VENOUS: 0.79 mmol/L (ref 0.5–2.2)

## 2014-05-16 LAB — LIPASE, BLOOD: Lipase: 33 U/L (ref 11–59)

## 2014-05-16 MED ORDER — PREDNISONE 20 MG PO TABS: 40.0000 mg | ORAL_TABLET | Freq: Every day | ORAL | Status: AC

## 2014-05-16 MED ORDER — PREDNISONE 50 MG PO TABS
60.0000 mg | ORAL_TABLET | ORAL | Status: AC
  Administered 2014-05-16: 60 mg via ORAL
  Filled 2014-05-16 (×2): qty 1

## 2014-05-16 MED ORDER — BENZONATATE 100 MG PO CAPS: 100.0000 mg | ORAL_CAPSULE | Freq: Three times a day (TID) | ORAL | Status: DC | PRN

## 2014-05-16 NOTE — Telephone Encounter (Signed)
Got out of the hospital Saturday for pneumonia but he is still coughing a lot bringing up clear phlegm. Has 1 more pill left to take on his antibiotics. Wife states he had a bad night lastnight, hurting in his chest when he coughs, feeling bad. No fever though. Wants to know does she bring him here or back to the Ed. Is scheduled to go for repeat chest xray on the 24th and come here on the 29th. Please advise .

## 2014-05-16 NOTE — Telephone Encounter (Signed)
Based on the fact that his night was as bad as she reports and he feels badly, I recommend that he return to the ED for re evaluation, please let her know

## 2014-05-16 NOTE — ED Provider Notes (Addendum)
CSN: [id number]     Arrival date & time 05/16/14  1106 History  This chart was scribed for Carmin Muskrat, MD by Ludger Nutting, ED Scribe. This patient was seen in room APA07/APA07 and the patient's care was started 11:38 AM.    Chief Complaint  Patient presents with  . Cough      The history is provided by the patient. No language interpreter was used.    HPI Comments: Samuel Ryan is a 80 y.o. male with past medical history of DM, HLD, PE, DVT, pneumonia who presents to the Emergency Department complaining of an intermittent cough productive of thick green/yellow sputum with associated SOB that has been ongoing for the past several weeks. Patient states he also has chest congestion that is worse while sleeping. Wife reports patient was admitted to the hospital last week for worsened pneumonia and was given IV antibiotics. Patient was released from the hospital and wife states that an intermittent cough remains. Patient also reports associated mild leg swelling which is resolved with elevation. He denies fever, emesis, weakness, LOC, weight change.   PCP Dr. Moshe Cipro  Past Medical History  Diagnosis Date  . Type 2 diabetes mellitus   . Mixed hyperlipidemia   . Essential hypertension, benign   . Dementia   . BPH (benign prostatic hypertrophy)   . Pulmonary embolism     Diagnosed 7/12  . Deep venous thrombosis     Left leg, diagnosed 7/12  . Osteoarthritis   . Diverticulitis   . Acute cholecystitis 12/12/2012 to 12/19/2012    drainage tube placed, hosptalizred x 1 week, then nursing home  . Pneumonia    Past Surgical History  Procedure Laterality Date  . Hernia repair    . Neck fusion    . Carpal tunnel release    . Right total knee replacement  04/23/2011  . Colonoscopy  08/31/2012    Procedure: COLONOSCOPY;  Surgeon: Rogene Houston, MD;  [facility]: [facility];  Service: Endoscopy;  Laterality: N/A;  86   Family History  Problem Relation Age of Onset  . Cancer Mother      Stomach  . Diabetes Sister   . Hypertension Sister   . Diabetes Brother   . Cancer Brother     Gallbladder   History  Substance Use Topics  . Smoking status: Never Smoker   . Smokeless tobacco: Never Used  . Alcohol Use: No    Review of Systems  Constitutional:       Per HPI, otherwise negative  HENT:       Per HPI, otherwise negative  Respiratory:       Per HPI, otherwise negative  Cardiovascular:       Per HPI, otherwise negative  Gastrointestinal: Negative for vomiting.  Endocrine:       Negative aside from HPI  Genitourinary:       Neg aside from HPI   Musculoskeletal:       Per HPI, otherwise negative  Skin: Negative.   Neurological: Negative for syncope.      Allergies  Vancomycin and Zosyn  Home Medications   Prior to Admission medications   Medication Sig Start Date End Date Taking? Authorizing Provider  acetaminophen (TYLENOL) 500 MG tablet Take 1,000 mg by mouth every 6 (six) hours as needed for moderate pain.    Historical Provider, MD  aspirin EC 81 MG tablet Take 1 tablet (81 mg total) by mouth daily. 06/19/13 06/19/14  [name], MD  clobetasol cream (TEMOVATE) 3.38 % Apply 1 application topically 2 (two) times daily. 10/22/13   [name], MD  donepezil (ARICEPT) 10 MG tablet TAKE ONE TABLET DAILY AT BEDTIME 05/07/14   [name], MD  ferrous sulfate 325 (65 FE) MG tablet TAKE ONE TABLET BY MOUTH TWICE DAILY 05/07/14   [name], MD  fluticasone [facility]) 50 MCG/ACT nasal spray Place 2 sprays into both nostrils daily. 01/31/14   [name], MD  glipiZIDE (GLUCOTROL XL) 10 MG 24 hr tablet Take 1 tablet (10 mg total) by mouth daily with breakfast. 05/07/14   [name], MD  guaiFENesin-dextromethorphan (ROBITUSSIN DM) 100-10 MG/5ML syrup Take 5 mLs by mouth every 4 (four) hours as needed for cough. 05/11/14   Hosie Poisson, MD  levofloxacin (LEVAQUIN) 500 MG tablet Take 1 tablet (500 mg total) by mouth daily.  05/11/14   Hosie Poisson, MD  lovastatin (MEVACOR) 40 MG tablet TAKE ONE TABLET DAILY AT BEDTIME 05/07/14   [name], MD  sertraline (ZOLOFT) 50 MG tablet TAKE ONE (1) TABLET EACH DAY 05/07/14   [name], MD  silodosin (RAPAFLO) 8 MG CAPS capsule Take 1 capsule (8 mg total) by mouth daily with breakfast. 09/06/12   [name], MD  traMADol (ULTRAM) 50 MG tablet Take 1 tablet (50 mg total) by mouth 2 (two) times daily. 10/22/13 10/22/14  [name], MD  Vitamin D, Ergocalciferol, (DRISDOL) 50000 UNITS CAPS capsule TAKE 1 CAPSULE EVERY WEEK 04/26/14   [name], MD   BP 137/68  Pulse 60  Temp(Src) 97.9 F (36.6 C) (Oral)  Resp 18  Ht 5\' 10"  (1.778 m)  Wt 250 lb (113.399 kg)  BMI 35.87 kg/m2  SpO2 96% Physical Exam  Nursing note and vitals reviewed. Constitutional: He is oriented to person, place, and time. He appears well-developed. No distress.  HENT:  Head: Normocephalic and atraumatic.  Eyes: Conjunctivae and EOM are normal.  Cardiovascular: Normal rate and regular rhythm.   Pulmonary/Chest: Effort normal. No stridor. No respiratory distress. He has no wheezes.  Audible breath sounds throughout. No wheezing.   Abdominal: He exhibits no distension.  Musculoskeletal: He exhibits no edema.  Neurological: He is alert and oriented to person, place, and time.  Skin: Skin is warm and dry.  Psychiatric: He has a normal mood and affect. His speech is delayed. He is slowed. Cognition and memory are impaired.    ED Course  Procedures (including critical care time)  DIAGNOSTIC STUDIES: Oxygen Saturation is 96% on RA, adequate by my interpretation.    COORDINATION OF CARE: 11:45 AM Discussed treatment plan with pt at bedside and pt agreed to plan.   Labs Review Labs Reviewed  CBC WITH DIFFERENTIAL - Abnormal; Notable for the following:    RBC 3.88 (*)    Hemoglobin 12.1 (*)    HCT 35.3 (*)    Platelets 146 (*)    All other components within  normal limits  COMPREHENSIVE METABOLIC PANEL - Abnormal; Notable for the following:    Glucose, Bld 247 (*)    Creatinine, Ser 1.56 (*)    Albumin 3.0 (*)    GFR calc non Af Amer 40 (*)    GFR calc Af Amer 46 (*)    All other components within normal limits  URINALYSIS, ROUTINE W REFLEX MICROSCOPIC - Abnormal; Notable for the following:    Specific Gravity, Urine >1.030 (*)    Glucose, UA 250 (*)  Hgb urine dipstick SMALL (*)    All other components within normal limits  URINE MICROSCOPIC-ADD ON - Abnormal; Notable for the following:    Squamous Epithelial / LPF FEW (*)    Bacteria, UA FEW (*)    All other components within normal limits  LIPASE, BLOOD  PRO B NATRIURETIC PEPTIDE  PROTIME-INR  I-STAT CG4 LACTIC ACID, ED    Imaging Review Dg Chest 2 View  05/16/2014   CLINICAL DATA:  Chest discomfort.  EXAM: CHEST  2 VIEW  COMPARISON:  Two-view chest dated 05/09/2014, chest CT dated 05/09/2014  FINDINGS: Cardiac silhouette is enlarged. Linear areas of increased density within the lung bases. There is mild prominence of interstitial markings. No focal regions of consolidation. Degenerative changes within the left shoulder. No acute osseous abnormalities. There is flattening of the hemidiaphragms and increased AP diameter of the chest. Atherosclerotic calcifications within the aorta.  IMPRESSION: Linear scarring versus atelectasis within the lung bases. No acute regions of consolidation or acute infiltrates.  COPD   Electronically Signed   By: Margaree Mackintosh M.D.   On: 05/16/2014 12:46   1:35 PM On exam the patient is smiling, sitting upright. Conversation about his cough, recent hospitalization for pneumonia, and evidence for possible COPD persistency.  Patient has a previously scheduled primary care visit next week.  MDM   I personally performed the services described in this documentation, which was scribed in my presence. The recorded information has been reviewed and is  accurate.   Patient presents with concern of ongoing cough.  The patient is awake, alert, not hypoxic, not tachypneic, and in no distress. Patient's recent history of hospitalization for pneumonia is concerning, but today his vital signs, labs are reassuring.  X-ray does not demonstrate worsening pneumonia. With no ongoing chest pain, there is low concern for ACS. Patient does have history of PE, but has previously had a bleeding events, and does not a candidate, per report for anticoagulation.  In addition there is no evidence for current thromboembolic processes given the aforementioned absence of hypoxia, tachypnea, tachycardia, distress. Patient was discharged in stable condition with previously scheduled outpatient followup in less than one week. With some concern for COPD, patient will have a short course of steroids, in addition to anti-cough medicine and his remaining Levaquin.  And  Carmin Muskrat, MD 05/16/14 [place], MD 05/16/14 1341

## 2014-05-16 NOTE — Telephone Encounter (Signed)
Wife aware and is thankful for followup

## 2014-05-16 NOTE — ED Notes (Signed)
Per family and patient seen here x3 diagnosed with pneumonia. Patient reports still coughing thick green/yellow sputum with chest "soreness." Patient admitted to hospital the first visit and given IV antibiotics, second time given azithromycin which he finished all of. Patient has one day of antibiotics (levofloxacin 500mg ) left this time and is taking Tussionex and Proventil Inhaler for cough. Patient reports some shortness or breath with exertion. Patient told to come here today by PCP for recheck.

## 2014-05-16 NOTE — Discharge Instructions (Signed)
As discussed, your evaluation today has been largely reassuring.  But, it is important that you monitor your condition carefully, and do not hesitate to return to the ED if you develop new, or concerning changes in your condition.  Otherwise, please follow-up with your physician for appropriate ongoing care.  Cough, Adult  A cough is a reflex. It helps you clear your throat and airways. A cough can help heal your body. A cough can last 2 or 3 weeks (acute) or may last more than 8 weeks (chronic). Some common causes of a cough can include an infection, allergy, or a cold. HOME CARE  Only take medicine as told by your doctor.  If given, take your medicines (antibiotics) as told. Finish them even if you start to feel better.  Use a cold steam vaporizer or humidier in your home. This can help loosen thick spit (secretions).  Sleep so you are almost sitting up (semi-upright). Use pillows to do this. This helps reduce coughing.  Rest as needed.  Stop smoking if you smoke. GET HELP RIGHT AWAY IF:  You have yellowish-white fluid (pus) in your thick spit.  Your cough gets worse.  Your medicine does not reduce coughing, and you are losing sleep.  You cough up blood.  You have trouble breathing.  Your pain gets worse and medicine does not help.  You have a fever. MAKE SURE YOU:   Understand these instructions.  Will watch your condition.  Will get help right away if you are not doing well or get worse. Document Released: 07/29/2011 Document Revised: 02/07/2012 Document Reviewed: 07/29/2011 [facility] Patient Information 2015 [place], [place]. This information is not intended to replace advice given to you by your health care provider. Make sure you discuss any questions you have with your health care provider.

## 2014-05-26 ENCOUNTER — Emergency Department (HOSPITAL_COMMUNITY): Payer: Medicare Other

## 2014-05-26 ENCOUNTER — Encounter (HOSPITAL_COMMUNITY): Payer: Self-pay | Admitting: Emergency Medicine

## 2014-05-26 ENCOUNTER — Emergency Department (HOSPITAL_COMMUNITY)
Admission: EM | Admit: 2014-05-26 | Discharge: 2014-05-26 | Disposition: A | Discharge status: Home / Self Care | Payer: Medicare Other | Attending: Emergency Medicine | Admitting: Emergency Medicine

## 2014-05-26 DIAGNOSIS — F0390 Unspecified dementia without behavioral disturbance: Secondary | ICD-10-CM | POA: Insufficient documentation

## 2014-05-26 DIAGNOSIS — R739 Hyperglycemia, unspecified: Secondary | ICD-10-CM

## 2014-05-26 DIAGNOSIS — Z86718 Personal history of other venous thrombosis and embolism: Secondary | ICD-10-CM | POA: Insufficient documentation

## 2014-05-26 DIAGNOSIS — E782 Mixed hyperlipidemia: Secondary | ICD-10-CM | POA: Insufficient documentation

## 2014-05-26 DIAGNOSIS — Z87448 Personal history of other diseases of urinary system: Secondary | ICD-10-CM | POA: Insufficient documentation

## 2014-05-26 DIAGNOSIS — Z8701 Personal history of pneumonia (recurrent): Secondary | ICD-10-CM | POA: Insufficient documentation

## 2014-05-26 DIAGNOSIS — Z79899 Other long term (current) drug therapy: Secondary | ICD-10-CM | POA: Insufficient documentation

## 2014-05-26 DIAGNOSIS — Z7982 Long term (current) use of aspirin: Secondary | ICD-10-CM | POA: Insufficient documentation

## 2014-05-26 DIAGNOSIS — I10 Essential (primary) hypertension: Secondary | ICD-10-CM | POA: Insufficient documentation

## 2014-05-26 DIAGNOSIS — Z8739 Personal history of other diseases of the musculoskeletal system and connective tissue: Secondary | ICD-10-CM | POA: Insufficient documentation

## 2014-05-26 DIAGNOSIS — E119 Type 2 diabetes mellitus without complications: Secondary | ICD-10-CM | POA: Insufficient documentation

## 2014-05-26 DIAGNOSIS — R55 Syncope and collapse: Secondary | ICD-10-CM | POA: Insufficient documentation

## 2014-05-26 DIAGNOSIS — IMO0002 Reserved for concepts with insufficient information to code with codable children: Secondary | ICD-10-CM | POA: Insufficient documentation

## 2014-05-26 DIAGNOSIS — Z86711 Personal history of pulmonary embolism: Secondary | ICD-10-CM | POA: Insufficient documentation

## 2014-05-26 DIAGNOSIS — Z8719 Personal history of other diseases of the digestive system: Secondary | ICD-10-CM | POA: Insufficient documentation

## 2014-05-26 LAB — URINALYSIS, ROUTINE W REFLEX MICROSCOPIC
Bilirubin Urine: NEGATIVE
Glucose, UA: >1000 mg/dL — AB
Ketones, ur: NEGATIVE mg/dL
Leukocytes, UA: NEGATIVE
NITRITE: NEGATIVE
PH: 5.5 (ref 5.0–8.0)
Protein, ur: NEGATIVE mg/dL
SPECIFIC GRAVITY, URINE: 1.02 (ref 1.005–1.030)
Urobilinogen, UA: 0.2 mg/dL (ref 0.0–1.0)

## 2014-05-26 LAB — CBC WITH DIFFERENTIAL/PLATELET
BASOS ABS: 0 10*3/uL (ref 0.0–0.1)
BASOS PCT: 0 % (ref 0–1)
EOS ABS: 0.1 10*3/uL (ref 0.0–0.7)
Eosinophils Relative: 2 % (ref 0–5)
HCT: 36.1 % — ABNORMAL LOW (ref 39.0–52.0)
Hemoglobin: 12.5 g/dL — ABNORMAL LOW (ref 13.0–17.0)
Lymphocytes Relative: 32 % (ref 12–46)
Lymphs Abs: 1.5 10*3/uL (ref 0.7–4.0)
MCH: 31.3 pg (ref 26.0–34.0)
MCHC: 34.6 g/dL (ref 30.0–36.0)
MCV: 90.5 fL (ref 78.0–100.0)
Monocytes Absolute: 0.4 10*3/uL (ref 0.1–1.0)
Monocytes Relative: 8 % (ref 3–12)
NEUTROS PCT: 58 % (ref 43–77)
Neutro Abs: 2.7 10*3/uL (ref 1.7–7.7)
PLATELETS: 141 10*3/uL — AB (ref 150–400)
RBC: 3.99 MIL/uL — ABNORMAL LOW (ref 4.22–5.81)
RDW: 13.3 % (ref 11.5–15.5)
WBC: 4.7 10*3/uL (ref 4.0–10.5)

## 2014-05-26 LAB — COMPREHENSIVE METABOLIC PANEL
ALBUMIN: 3.1 g/dL — AB (ref 3.5–5.2)
ALT: 7 U/L (ref 0–53)
AST: 9 U/L (ref 0–37)
Alkaline Phosphatase: 76 U/L (ref 39–117)
BUN: 19 mg/dL (ref 6–23)
CO2: 24 mEq/L (ref 19–32)
Calcium: 8.5 mg/dL (ref 8.4–10.5)
Chloride: 100 mEq/L (ref 96–112)
Creatinine, Ser: 1.53 mg/dL — ABNORMAL HIGH (ref 0.50–1.35)
GFR calc Af Amer: 47 mL/min — ABNORMAL LOW (ref >90)
GFR calc non Af Amer: 41 mL/min — ABNORMAL LOW (ref >90)
Glucose, Bld: 373 mg/dL — ABNORMAL HIGH (ref 70–99)
POTASSIUM: 4.8 meq/L (ref 3.7–5.3)
Sodium: 134 mEq/L — ABNORMAL LOW (ref 137–147)
TOTAL PROTEIN: 6.6 g/dL (ref 6.0–8.3)
Total Bilirubin: 1.1 mg/dL (ref 0.3–1.2)

## 2014-05-26 LAB — URINE MICROSCOPIC-ADD ON

## 2014-05-26 LAB — CBG MONITORING, ED: Glucose-Capillary: 348 mg/dL — ABNORMAL HIGH (ref 70–99)

## 2014-05-26 LAB — TROPONIN I

## 2014-05-26 MED ORDER — SODIUM CHLORIDE 0.9 % IV SOLN
1000.0000 mL | INTRAVENOUS | Status: DC
  Administered 2014-05-26: 1000 mL via INTRAVENOUS

## 2014-05-26 NOTE — ED Notes (Signed)
Patient with no complaints at this time. Respirations even and unlabored. Skin warm/dry. Discharge instructions reviewed with patient at this time. Patient given opportunity to voice concerns/ask questions. Patient discharged at this time and left Emergency Department with steady gait.   

## 2014-05-26 NOTE — ED Provider Notes (Signed)
CSN: [id number]     Arrival date & time 05/26/14  1317 History  This chart was scribed for Carmin Muskrat, MD by Elby Beck, ED Scribe. This patient was seen in room APA03/APA03 and the patient's care was started at 1:39 PM.   Chief Complaint  Patient presents with  . Near Syncope    The history is provided by the patient. No language interpreter was used.    HPI Comments: Samuel Ryan is a 80 y.o. male with a history of HTN, T2DM, PE and dementia who presents to the Emergency Department complaining of a near syncopal episode that occurred earlier today. Pt reports that he stood up at church today and he became lightheaded and nearly passed out. Pt's relative states that pt has been complaining of "hurting all over" today. Relative also states that pt has recently been diagnosed with pneumonia and that his blood sugar has been high for the past few days. Relative also expresses concern that pt has been "mumbling" and has seemed more confused than usual today. He denies any abdominal pain.    Past Medical History  Diagnosis Date  . Type 2 diabetes mellitus   . Mixed hyperlipidemia   . Essential hypertension, benign   . Dementia   . BPH (benign prostatic hypertrophy)   . Pulmonary embolism     Diagnosed 7/12  . Deep venous thrombosis     Left leg, diagnosed 7/12  . Osteoarthritis   . Diverticulitis   . Acute cholecystitis 12/12/2012 to 12/19/2012    drainage tube placed, hosptalizred x 1 week, then nursing home  . Pneumonia    Past Surgical History  Procedure Laterality Date  . Hernia repair    . Neck fusion    . Carpal tunnel release    . Right total knee replacement  04/23/2011  . Colonoscopy  08/31/2012    Procedure: COLONOSCOPY;  Surgeon: Rogene Houston, MD;  [facility];  Service: Endoscopy;  Laterality: N/A;  23   Family History  Problem Relation Age of Onset  . Cancer Mother     Stomach  . Diabetes Sister   . Hypertension Sister   . Diabetes Brother    . Cancer Brother     Gallbladder   History  Substance Use Topics  . Smoking status: Never Smoker   . Smokeless tobacco: Never Used  . Alcohol Use: No    Review of Systems  Constitutional:       Per HPI, otherwise negative  HENT:       Per HPI, otherwise negative  Respiratory:       Per HPI, otherwise negative  Cardiovascular:       Per HPI, otherwise negative  Gastrointestinal: Negative for vomiting.  Endocrine:       Negative aside from HPI  Genitourinary:       Neg aside from HPI   Musculoskeletal:       Per HPI, otherwise negative  Skin: Negative.   Neurological: Negative for syncope.    Allergies  Vancomycin and Zosyn  Home Medications   Prior to Admission medications   Medication Sig Start Date End Date Taking? Authorizing Provider  acetaminophen (TYLENOL) 500 MG tablet Take 1,000 mg by mouth every 6 (six) hours as needed for moderate pain.    Historical Provider, MD  aspirin EC 81 MG tablet Take 1 tablet (81 mg total) by mouth daily. 06/19/13 06/19/14  [name], MD  benzonatate (TESSALON) 100 MG capsule Take  1 capsule (100 mg total) by mouth 3 (three) times daily as needed for cough. 05/16/14   Carmin Muskrat, MD  clobetasol cream (TEMOVATE) 0.16 % Apply 1 application topically 2 (two) times daily. 10/22/13   [name], MD  donepezil (ARICEPT) 10 MG tablet TAKE ONE TABLET DAILY AT BEDTIME 05/07/14   [name], MD  ferrous sulfate 325 (65 FE) MG tablet TAKE ONE TABLET BY MOUTH TWICE DAILY 05/07/14   [name], MD  fluticasone [facility]) 50 MCG/ACT nasal spray Place 2 sprays into both nostrils daily. 01/31/14   [name], MD  glipiZIDE (GLUCOTROL XL) 10 MG 24 hr tablet Take 1 tablet (10 mg total) by mouth daily with breakfast. 05/07/14   [name], MD  levofloxacin (LEVAQUIN) 500 MG tablet Take 1 tablet (500 mg total) by mouth daily. 05/11/14   Hosie Poisson, MD  lovastatin (MEVACOR) 40 MG tablet TAKE ONE TABLET DAILY AT  BEDTIME 05/07/14   [name], MD  Polyethylene Glycol 400 (BLINK TEARS OP) Apply 1 drop to eye daily as needed (dry eyes).    Historical Provider, MD  sertraline (ZOLOFT) 50 MG tablet TAKE ONE (1) TABLET EACH DAY 05/07/14   [name], MD  silodosin (RAPAFLO) 8 MG CAPS capsule Take 1 capsule (8 mg total) by mouth daily with breakfast. 09/06/12   [name], MD  traMADol (ULTRAM) 50 MG tablet Take 1 tablet (50 mg total) by mouth 2 (two) times daily. 10/22/13 10/22/14  [name], MD  Vitamin D, Ergocalciferol, (DRISDOL) 50000 UNITS CAPS capsule TAKE 1 CAPSULE EVERY WEEK 04/26/14   [name], MD   Triage Vitals: BP 118/57  Pulse 70  Temp(Src) 97.3 F (36.3 C) (Oral)  Resp 18  Ht 5\' 10"  (1.778 m)  Wt 250 lb (113.399 kg)  BMI 35.87 kg/m2  SpO2 94%  Physical Exam  Nursing note and vitals reviewed. Constitutional: He is oriented to person, place, and time. He appears well-developed. No distress.  HENT:  Head: Normocephalic and atraumatic.  Eyes: Conjunctivae and EOM are normal.  Cardiovascular: Normal rate and regular rhythm.   Pulmonary/Chest: Effort normal and breath sounds normal. No stridor. No respiratory distress. He has no wheezes. He has no rales.  Lungs CTA bilaterally  Abdominal: He exhibits no distension.  Musculoskeletal: He exhibits no edema.  Neurological: He is alert and oriented to person, place, and time.  Skin: Skin is warm and dry.  Psychiatric: He has a normal mood and affect.    ED Course  Procedures (including critical care time)  DIAGNOSTIC STUDIES: Oxygen Saturation is 94% on RA, adequate by my interpretation.    COORDINATION OF CARE: 1:45 PM- Pt advised of plan for treatment and pt agrees.  Labs Review Labs Reviewed  CBC WITH DIFFERENTIAL - Abnormal; Notable for the following:    RBC 3.99 (*)    Hemoglobin 12.5 (*)    HCT 36.1 (*)    Platelets 141 (*)    All other components within normal limits  COMPREHENSIVE  METABOLIC PANEL - Abnormal; Notable for the following:    Sodium 134 (*)    Glucose, Bld 373 (*)    Creatinine, Ser 1.53 (*)    Albumin 3.1 (*)    GFR calc non Af Amer 41 (*)    GFR calc Af Amer 47 (*)    All other components within normal limits  URINALYSIS, ROUTINE W REFLEX MICROSCOPIC - Abnormal; Notable for the following:  Glucose, UA >1000 (*)    Hgb urine dipstick SMALL (*)    All other components within normal limits  CBG MONITORING, ED - Abnormal; Notable for the following:    Glucose-Capillary 348 (*)    All other components within normal limits  TROPONIN I  URINE MICROSCOPIC-ADD ON  POCT CBG (FASTING - GLUCOSE)-MANUAL ENTRY    Imaging Review Dg Chest 2 View  05/26/2014   CLINICAL DATA:  Syncope.  Recent pneumonia.  EXAM: CHEST  2 VIEW  COMPARISON:  Chest radiograph 05/16/2014.  FINDINGS: Stable enlarged cardiac and mediastinal contours. Minimal left basilar atelectasis and or scarring. Additionally there is linear increased density within the right middle and lower lung, stable from recent prior, potentially secondary to scarring and or atelectasis. No large area of consolidation. No pleural effusion or pneumothorax.  IMPRESSION: No acute cardiopulmonary process.  Stable scarring and or atelectasis right greater than left lower lungs.   Electronically Signed   By: Lovey Newcomer M.D.   On: 05/26/2014 15:18     EKG Interpretation   Date/Time:  Sunday May 26 2014 14:03:01 EDT Ventricular Rate:  66 PR Interval:  178 QRS Duration: 106 QT Interval:  392 QTC Calculation: 411 R Axis:   81 Text Interpretation:  Sinus rhythm Borderline right axis deviation Sinus  rhythm t waves have normalized in inferior leads since last tracing  Confirmed by Carmin Muskrat  MD ([id number]) on 05/26/2014 3:33:53 PM     4:09 PM On repeat exam the patient is sitting upright, in no distress. We discussed all findings. Patient will followup tomorrow with his physician.  MDM   Final diagnoses:   Near syncope  Hyperglycemia    I personally performed the services described in this documentation, which was scribed in my presence. The recorded information has been reviewed and is accurate.   Patient presents with of near-syncope that occurred after he stood upright at church. On exam patient is awake, alert, neurologically intact. Given the absence of syncope, chest pain, and the patient's lack of distress in the emergency department, there is low suspicion for significant arrhythmia or atypical ACS. Patient's evaluation demonstrates hyperglycemia, which is likely contributory. No evidence for DKA. Patient improved with fluid resuscitation, and has scheduled followup tomorrow with his physician.   Carmin Muskrat, MD 05/26/14 1610

## 2014-05-26 NOTE — ED Notes (Signed)
PT was at church and stood up and got light headed and stated he almost passed out. PT c/o general weakness.

## 2014-05-26 NOTE — Discharge Instructions (Signed)
As discussed, it is important that you follow up tomorrow with your physician for continued management of your condition. ° °If you develop any new, or concerning changes in your condition, please return to the emergency department immediately. ° °

## 2014-05-27 ENCOUNTER — Ambulatory Visit (INDEPENDENT_AMBULATORY_CARE_PROVIDER_SITE_OTHER): Payer: Medicare Other | Admitting: Family Medicine

## 2014-05-27 ENCOUNTER — Other Ambulatory Visit: Payer: Self-pay | Admitting: Family Medicine

## 2014-05-27 ENCOUNTER — Telehealth: Payer: Self-pay | Admitting: Family Medicine

## 2014-05-27 VITALS — BP 122/64 | HR 60 | Resp 18 | Ht 70.0 in | Wt 244.1 lb

## 2014-05-27 DIAGNOSIS — J189 Pneumonia, unspecified organism: Secondary | ICD-10-CM

## 2014-05-27 DIAGNOSIS — F329 Major depressive disorder, single episode, unspecified: Secondary | ICD-10-CM

## 2014-05-27 DIAGNOSIS — F0280 Dementia in other diseases classified elsewhere without behavioral disturbance: Secondary | ICD-10-CM

## 2014-05-27 DIAGNOSIS — F0393 Unspecified dementia, unspecified severity, with mood disturbance: Secondary | ICD-10-CM

## 2014-05-27 DIAGNOSIS — E1121 Type 2 diabetes mellitus with diabetic nephropathy: Secondary | ICD-10-CM

## 2014-05-27 DIAGNOSIS — R9389 Abnormal findings on diagnostic imaging of other specified body structures: Secondary | ICD-10-CM

## 2014-05-27 DIAGNOSIS — E785 Hyperlipidemia, unspecified: Secondary | ICD-10-CM

## 2014-05-27 DIAGNOSIS — F3289 Other specified depressive episodes: Secondary | ICD-10-CM

## 2014-05-27 LAB — GLUCOSE, POCT (MANUAL RESULT ENTRY): POC Glucose: 325 mg/dl — AB (ref 70–99)

## 2014-05-27 MED ORDER — GLIPIZIDE ER 2.5 MG PO TB24: 2.5000 mg | ORAL_TABLET | Freq: Every day | ORAL | Status: DC

## 2014-05-27 NOTE — Telephone Encounter (Signed)
Pt has f/yu in office today will address blood sugar issues at that time

## 2014-05-27 NOTE — Patient Instructions (Signed)
F/u in 6 weeks, call if you need me before  New referral for lung specialist to evaluate you, we will call next week with appointment  BP is gREAT  Blood sugar is too high, new EXTRA glipizide 2.5mg  take every morning at breakfast with your 10mg  tablet   We are asking a case manager / [facility] to come and provide help with your medications and needs at home   We are requesting onglyza from the pharmaceutical company

## 2014-05-28 ENCOUNTER — Ambulatory Visit: Payer: Medicare Other | Admitting: Family Medicine

## 2014-06-07 DIAGNOSIS — E1129 Type 2 diabetes mellitus with other diabetic kidney complication: Secondary | ICD-10-CM

## 2014-06-07 DIAGNOSIS — E1165 Type 2 diabetes mellitus with hyperglycemia: Secondary | ICD-10-CM

## 2014-06-07 DIAGNOSIS — N058 Unspecified nephritic syndrome with other morphologic changes: Secondary | ICD-10-CM

## 2014-06-07 MED ORDER — INSULIN ASPART 100 UNIT/ML ~~LOC~~ SOLN
5.0000 [IU] | Freq: Once | SUBCUTANEOUS | Status: AC
  Administered 2014-06-07: 5 [IU] via SUBCUTANEOUS

## 2014-06-13 ENCOUNTER — Ambulatory Visit (INDEPENDENT_AMBULATORY_CARE_PROVIDER_SITE_OTHER): Payer: Medicare Other | Admitting: Otolaryngology

## 2014-06-13 DIAGNOSIS — D449 Neoplasm of uncertain behavior of unspecified endocrine gland: Secondary | ICD-10-CM

## 2014-06-14 ENCOUNTER — Other Ambulatory Visit (INDEPENDENT_AMBULATORY_CARE_PROVIDER_SITE_OTHER): Payer: Self-pay | Admitting: Otolaryngology

## 2014-06-14 DIAGNOSIS — E079 Disorder of thyroid, unspecified: Secondary | ICD-10-CM

## 2014-06-20 ENCOUNTER — Telehealth: Payer: Self-pay | Admitting: Family Medicine

## 2014-06-20 ENCOUNTER — Other Ambulatory Visit: Payer: Self-pay

## 2014-06-20 MED ORDER — GLIPIZIDE ER 10 MG PO TB24: ORAL_TABLET | ORAL | Status: DC

## 2014-06-20 MED ORDER — TAMSULOSIN HCL 0.4 MG PO CAPS: 0.4000 mg | ORAL_CAPSULE | Freq: Every day | ORAL | Status: DC

## 2014-06-20 NOTE — Telephone Encounter (Signed)
seen

## 2014-06-20 NOTE — Telephone Encounter (Signed)
pls see detailed tel message

## 2014-06-20 NOTE — Telephone Encounter (Signed)
Note from [facility] being involved with patient, contact is Jacqlyn Larsen. Since onglyza will not be supplied, pt on glipizide 12.5 mg total supposedly with FBG ranging from 172 to 254, I propose change to regular glipizide xl 10mg  TWO daily in the morning, (now on 1) he can take four of his 2.5mg  tabs together at one time to equal one 10mg  tab) or may be simpler for him to fill new scriopt entered.  Pls discuss with pt and [facility] provider so this is understood  ?? pls ask  Let pt know I see where he saw Dr Benjamine Mola about his thyroid gland and I am happy he went   Let [facility] know I changed rapaflo to flomax , which should be just as beneficial, they can still see if they can get the rapaflo however, pls send in the 2 new scripts entered hiostorically  ??? pls ask

## 2014-06-20 NOTE — Telephone Encounter (Signed)
Patient and his wife are aware as well as Jacqlyn Larsen at [facility]. Meds sent in

## 2014-06-23 ENCOUNTER — Other Ambulatory Visit: Payer: Self-pay | Admitting: Family Medicine

## 2014-06-27 ENCOUNTER — Encounter (HOSPITAL_COMMUNITY): Payer: Self-pay

## 2014-06-27 ENCOUNTER — Ambulatory Visit (HOSPITAL_COMMUNITY)
Admission: RE | Admit: 2014-06-27 | Discharge: 2014-06-27 | Disposition: A | Discharge status: Home / Self Care | Payer: Medicare Other | Source: Ambulatory Visit | Attending: Otolaryngology | Admitting: Otolaryngology

## 2014-06-27 DIAGNOSIS — E079 Disorder of thyroid, unspecified: Secondary | ICD-10-CM | POA: Diagnosis present

## 2014-06-27 MED ORDER — LIDOCAINE HCL (PF) 2 % IJ SOLN
INTRAMUSCULAR | Status: AC
  Administered 2014-06-27: 10 mL
  Filled 2014-06-27: qty 20

## 2014-06-27 MED ORDER — LIDOCAINE HCL (PF) 2 % IJ SOLN
10.0000 mL | Freq: Once | INTRAMUSCULAR | Status: AC
  Administered 2014-06-27: 10 mL

## 2014-06-27 NOTE — Procedures (Signed)
PreOperative Dx: BILATERAL thyroid nodules Postoperative Dx: BILATERAL thyroid nodules Procedure:   US guided FNA of BILATERAL thyroid nodules Radiologist:  Thornton Papas Anesthesia:  2 ml of 2% lidocaine Specimen:  FNA x 3 of LEFT and FNA x 3 of RIGHT thyroid nodules  EBL:   < 1 ml Complications: None

## 2014-06-27 NOTE — Discharge Instructions (Signed)
Thyroid Biopsy °The thyroid gland is a butterfly-shaped gland situated in the front of the neck. It produces hormones which affect metabolism, growth and development, and body temperature. A thyroid biopsy is a procedure in which small samples of tissue or fluid are removed from the thyroid gland or mass and examined under a microscope. This test is done to determine the cause of thyroid problems, such as infection, cancer, or other thyroid problems. °There are 2 ways to obtain samples: °1. Fine needle biopsy. Samples are removed using a thin needle inserted through the skin and into the thyroid gland or mass. °2. Open biopsy. Samples are removed after a cut (incision) is made through the skin. °LET YOUR CAREGIVER KNOW ABOUT:  °· Allergies. °· Medications taken including herbs, eye drops, over-the-counter medications, and creams. °· Use of steroids (by mouth or creams). °· Previous problems with anesthetics or numbing medicine. °· Possibility of pregnancy, if this applies. °· History of blood clots (thrombophlebitis). °· History of bleeding or blood problems. °· Previous surgery. °· Other health problems. °RISKS AND COMPLICATIONS °· Bleeding from the site. The risk of bleeding is higher if you have a bleeding disorder or are taking any blood thinning medications (anticoagulants). °· Infection. °· Injury to structures near the thyroid gland. °BEFORE THE PROCEDURE  °This is a procedure that can be done as an outpatient. Confirm the time that you need to arrive for your procedure. Confirm whether there is a need to fast or withhold any medications. A blood sample may be done to determine your blood clotting time. Medicine may be given to help you relax (sedative). °PROCEDURE °Fine needle biopsy. °You will be awake during the procedure. You may be asked to lie on your back with your head tipped backward to extend your neck. Let your caregiver know if you cannot tolerate the positioning. An area on your neck will be  cleansed. A needle is inserted through the skin of your neck. You may feel a mild discomfort during this procedure. You may be asked to avoid coughing, talking, swallowing, or making sounds during some portions of the procedure. The needle is withdrawn once tissue or fluid samples have been removed. Pressure may be applied to the neck to reduce swelling and ensure that bleeding has stopped. The samples will be sent for examination.  °Open biopsy. °You will be given general anesthesia. You will be asleep during the procedure. An incision is made in your neck. A sample of thyroid tissue or the mass is removed. The tissue sample or mass will be sent for examination. The sample or mass may be examined during the biopsy. If the sample or mass contains cancer cells, some or all of the thyroid gland may be removed. The incision is closed with stitches. °AFTER THE PROCEDURE  °Your recovery will be assessed and monitored. If there are no problems, as an outpatient, you should be able to go home shortly after the procedure. °If you had a fine needle biopsy: °· You may have soreness at the biopsy site for 1 to 2 days. °If you had an open biopsy:  °· You may have soreness at the biopsy site for 3 to 4 days. °· You may have a hoarse voice or sore throat for 1 to 2 days. °Obtaining the Test Results °It is your responsibility to obtain your test results. Do not assume everything is normal if you have not heard from your caregiver or the medical facility. It is important for you to follow up   on all of your test results. °HOME CARE INSTRUCTIONS  °· Keeping your head raised on a pillow when you are lying down may ease biopsy site discomfort. °· Supporting the back of your head and neck with both hands as you sit up from a lying position may ease biopsy site discomfort. °· Only take over-the-counter or prescription medicines for pain, discomfort, or fever as directed by your caregiver. °· Throat lozenges or gargling with warm salt  water may help to soothe a sore throat. °SEEK IMMEDIATE MEDICAL CARE IF:  °· You have severe bleeding from the biopsy site. °· You have difficulty swallowing. °· You have a fever. °· You have increased pain, swelling, redness, or warmth at the biopsy site. °· You notice pus coming from the biopsy site. °· You have swollen glands (lymph nodes) in your neck. °Document Released: 09/12/2007 Document Revised: 03/12/2013 Document Reviewed: 02/07/2014 °[employer]® Patient Information ©2015 [employer]. This information is not intended to replace advice given to you by your health care provider. Make sure you discuss any questions you have with your health care provider. ° °

## 2014-07-03 ENCOUNTER — Ambulatory Visit (INDEPENDENT_AMBULATORY_CARE_PROVIDER_SITE_OTHER): Payer: Medicare Other | Admitting: Pulmonary Disease

## 2014-07-03 ENCOUNTER — Encounter: Payer: Self-pay | Admitting: Pulmonary Disease

## 2014-07-03 VITALS — BP 126/76 | HR 58 | Temp 98.1°F | Ht 70.0 in | Wt 241.8 lb

## 2014-07-03 DIAGNOSIS — J8410 Pulmonary fibrosis, unspecified: Secondary | ICD-10-CM | POA: Insufficient documentation

## 2014-07-03 NOTE — Progress Notes (Signed)
   Subjective:    Patient ID: Samuel Ryan, male    DOB: 09/30/1931, 80 y.o.   MRN: [id number]  HPI The patient is a pleasantly demented 80 year old male who I've been asked to see for an abnormal chest x-ray. The patient was recently admitted to the hospital with a community-acquired pneumonia in June of this year, and he was treated with antibiotics with improvement in his symptoms.  A CT chest during that admission showed significant interstitial changes primarily in the bases and more posteriorly. This was compared to a CT scan in January of 2014, and there was very little change. After discharge from the hospital, the patient continued to have a cough and was slow to improve. Followup chest x-rays showed very little change from his hospitalization, but it really was unchanged from chest x-rays dated a few years back. The patient has actually had interstitial changes dating back to 2009, although worse at this time. The patient has finally seen his cough almost totally resolve, and he is not bringing up any mucus at this time.  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 He did not wear a mask during this time. He also worked in a [employer], but was not involved with processing/manufacturing. He denies any known asbestos exposure or occupations that may put him at risk for asbestosis. The patient has had minimal tobacco use during his entire life. He does have some underlying dementia, but denies any history consistent with chronic aspiration. He has no history to suggest an autoimmune disease. He has chronic dyspnea on exertion primarily with more moderate activities, but is clearly very debilitated secondary to lower extremity joint issues.  Review of Systems  Constitutional: Negative for fever and unexpected weight change.  HENT: Negative for congestion, dental problem, ear pain, nosebleeds,  postnasal drip, rhinorrhea, sinus pressure, sneezing, sore throat and trouble swallowing.   Eyes: Negative for redness and itching.  Respiratory: Positive for shortness of breath. Negative for cough, chest tightness and wheezing.   Cardiovascular: Negative for palpitations and leg swelling.  Gastrointestinal: Negative for nausea and vomiting.  Genitourinary: Negative for dysuria.  Musculoskeletal: Negative for joint swelling.  Skin: Negative for rash.  Neurological: Negative for headaches.  Hematological: Does not bruise/bleed easily.  Psychiatric/Behavioral: Negative for dysphoric mood. The patient is not nervous/anxious.        Objective:   Physical Exam Constitutional:  Overweight male, no acute distress  HENT:  Nares patent without discharge  Oropharynx without exudate, palate and uvula are normal  Eyes:  Perrla, eomi, no scleral icterus  Neck:  No JVD, no TMG  Cardiovascular:  Normal rate, regular rhythm, no rubs or gallops.  No definite murmurs        Intact distal pulses  Pulmonary :  Normal breath sounds, no stridor or respiratory distress   No rhonchi, or wheezing.  Crackles 1/3 up bilat  Abdominal:  Soft, nondistended, bowel sounds present.  No tenderness noted.   Musculoskeletal:  mild lower extremity edema noted.  Lymph Nodes:  No cervical lymphadenopathy noted  Skin:  No cyanosis noted  Neurologic:  Alert, appropriate, but clearly has some underlying dementia, moves all 4 extremities without obvious deficit.         Assessment & Plan:

## 2014-07-03 NOTE — Assessment & Plan Note (Signed)
The patient has interstitial lung disease primarily in his lung bases and posteriorly of unknown origin. I suspect this has nothing to do with his recent community-acquired pneumonia, and his recent CT chest looks unchanged from that almost 2 years ago. He has interstitial disease dating back to 2009, and clearly has had some mild progression since that time. Currently, the patient feels that his cough has almost totally resolved, and he doesn't feel that his shortness of breath is much worse than his usual baseline. It is unclear if this is related to some type of occupational exposure, chronic aspiration related to his dementia, or some type of inflammatory lung disease. Given his underlying debility and dementia, as well as the fact that his CT has not changed much from almost 2 years ago, I would probably not put him through further evaluation. If he has a sudden worsening in his pulmonary symptoms or in his x-rays, we could give further consideration to some type of workup. I would be happy to see him again on an as-needed basis.

## 2014-07-03 NOTE — Patient Instructions (Signed)
I would recommend not pursuing the scarring in your lungs that has been present since 2012, and to some degree since 2009. Would be happy to see you again if you have worsening breathing symptoms or worsening xrays.

## 2014-07-11 ENCOUNTER — Ambulatory Visit: Payer: Medicare Other | Admitting: Family Medicine

## 2014-07-14 NOTE — Assessment & Plan Note (Signed)
Controlled, no change in medication  

## 2014-07-14 NOTE — Assessment & Plan Note (Signed)
Needs updated [place] and further eval by ENT, he now agrees to both

## 2014-07-14 NOTE — Assessment & Plan Note (Signed)
Stable on current medication , continue same 

## 2014-07-14 NOTE — Assessment & Plan Note (Signed)
Controlled, no change in medication Hyperlipidemia:Low fat diet discussed and encouraged.  \ 

## 2014-07-14 NOTE — Assessment & Plan Note (Signed)
Deteriorated, increase glipizide dose Patient advised to reduce carb and sweets, commit to regular physical activity, take meds as prescribed, test blood as directed, and attempt to lose weight, to improve blood sugar control.

## 2014-07-14 NOTE — Progress Notes (Signed)
   Subjective:    Patient ID: Samuel Ryan, male    DOB: 04/30/1931, 80 y.o.   MRN: [id number]  HPI {Pt in for f/u recent hospitalization for CAP, states he feels much better, except still has dry cough, denies fever or chills, appetite is almost back to normal Reports adequate sleep and bowel movements are regular Denies depression or uncontrolled anxiety or i insomnia He has had no falls , bi ut is very careful with regard to safety   Review of Systems See HPI Denies recent fever or chills. Denies sinus pressure, nasal congestion, ear pain or sore throat.  Denies chest pains, palpitations and leg swelling Denies abdominal pain, nausea, vomiting,diarrhea or constipation.   Denies dysuria, frequency, hesitancy or incontinence. Denies skin break down or rash.        Objective:   Physical Exam  BP 134/80  Pulse 80  Resp 16  Ht 5\' 10"  (1.778 m)  Wt 250 lb (113.399 kg)  BMI 35.87 kg/m2  SpO2 95% Patient alert  and in no cardiopulmonary distress.  HEENT: No facial asymmetry, EOMI,   oropharynx pink and moist.  Neck supple no JVD, goiter  Chest: adequate air entry throughout, bilateral lower bas crackles no wheezes  CVS: S1, S2 no murmurs, no S3.Regular rate.  ABD: Soft non tender.   Ext: No edema  MS: decreased  ROM spine, shoulders, hips and knees.  Skin: Intact, no ulcerations or rash noted.  Psych: Good eye contact, normal affect. Memory loss not anxious or depressed appearing.  CNS: CN 2-12 intact, power,  normal throughout.no focal deficits noted.       Assessment & Plan:  CAP (community acquired pneumonia) Improving, but still has dry cough f/u CXr to ensure complete clearing  Type 2 diabetes, uncontrolled, with renal manifestation Deteriorated, increase glipizide dose Patient advised to reduce carb and sweets, commit to regular physical activity, take meds as prescribed, test blood as directed, and attempt to lose weight, to improve blood sugar  control.   Dementia of Alzheimer's type with behavioral disturbance Stable on current medication, continue same  Depression due to dementia Controlled, no change in medication   Thyroid mass of unclear etiology Needs updated [place] and further eval by ENT, he now agrees to both  Hyperlipidemia with target LDL less than 100 Controlled, no change in medication Hyperlipidemia:Low fat diet discussed and encouraged.

## 2014-07-14 NOTE — Assessment & Plan Note (Signed)
Improving, but still has dry cough f/u CXr to ensure complete clearing

## 2014-07-16 ENCOUNTER — Ambulatory Visit (INDEPENDENT_AMBULATORY_CARE_PROVIDER_SITE_OTHER): Payer: Medicare Other | Admitting: Family Medicine

## 2014-07-16 ENCOUNTER — Encounter: Payer: Self-pay | Admitting: Family Medicine

## 2014-07-16 VITALS — BP 134/72 | HR 62 | Resp 18 | Ht 70.0 in | Wt 244.0 lb

## 2014-07-16 DIAGNOSIS — E785 Hyperlipidemia, unspecified: Secondary | ICD-10-CM

## 2014-07-16 DIAGNOSIS — F489 Nonpsychotic mental disorder, unspecified: Secondary | ICD-10-CM

## 2014-07-16 DIAGNOSIS — E1165 Type 2 diabetes mellitus with hyperglycemia: Secondary | ICD-10-CM

## 2014-07-16 DIAGNOSIS — M62838 Other muscle spasm: Secondary | ICD-10-CM

## 2014-07-16 DIAGNOSIS — F0390 Unspecified dementia without behavioral disturbance: Secondary | ICD-10-CM

## 2014-07-16 DIAGNOSIS — E1129 Type 2 diabetes mellitus with other diabetic kidney complication: Secondary | ICD-10-CM

## 2014-07-16 DIAGNOSIS — F409 Phobic anxiety disorder, unspecified: Secondary | ICD-10-CM

## 2014-07-16 DIAGNOSIS — F0393 Unspecified dementia, unspecified severity, with mood disturbance: Secondary | ICD-10-CM

## 2014-07-16 DIAGNOSIS — M159 Polyosteoarthritis, unspecified: Secondary | ICD-10-CM | POA: Insufficient documentation

## 2014-07-16 DIAGNOSIS — N39498 Other specified urinary incontinence: Secondary | ICD-10-CM

## 2014-07-16 DIAGNOSIS — F0280 Dementia in other diseases classified elsewhere without behavioral disturbance: Secondary | ICD-10-CM

## 2014-07-16 DIAGNOSIS — Z87898 Personal history of other specified conditions: Secondary | ICD-10-CM

## 2014-07-16 DIAGNOSIS — M1990 Unspecified osteoarthritis, unspecified site: Secondary | ICD-10-CM

## 2014-07-16 DIAGNOSIS — R32 Unspecified urinary incontinence: Secondary | ICD-10-CM | POA: Insufficient documentation

## 2014-07-16 DIAGNOSIS — F3289 Other specified depressive episodes: Secondary | ICD-10-CM

## 2014-07-16 DIAGNOSIS — F329 Major depressive disorder, single episode, unspecified: Secondary | ICD-10-CM

## 2014-07-16 DIAGNOSIS — IMO0002 Reserved for concepts with insufficient information to code with codable children: Secondary | ICD-10-CM

## 2014-07-16 DIAGNOSIS — F5105 Insomnia due to other mental disorder: Secondary | ICD-10-CM

## 2014-07-16 DIAGNOSIS — Z23 Encounter for immunization: Secondary | ICD-10-CM

## 2014-07-16 DIAGNOSIS — M129 Arthropathy, unspecified: Secondary | ICD-10-CM

## 2014-07-16 MED ORDER — PREDNISONE 5 MG PO TABS: 5.0000 mg | ORAL_TABLET | Freq: Two times a day (BID) | ORAL | Status: AC

## 2014-07-16 MED ORDER — DICLOFENAC SODIUM 1 % TD GEL
TRANSDERMAL | Status: DC
Start: 2014-07-16 — End: 2014-12-30

## 2014-07-16 NOTE — Patient Instructions (Signed)
F/u as before, call if you need me sooner  Glad blood sugar is improved and you are able to get your medications  Prevnar today  Foot exam is good and blood pressure  Gel to apply to neck and prednisone for 5 days sent in for neck pain   Fasting lipid, cmp and EGFr, hBA1C for October visit please

## 2014-07-17 LAB — MICROALBUMIN / CREATININE URINE RATIO
Creatinine, Urine: 134.4 mg/dL
MICROALB UR: 1.63 mg/dL (ref 0.00–1.89)
Microalb Creat Ratio: 12.1 mg/g (ref 0.0–30.0)

## 2014-07-21 NOTE — Assessment & Plan Note (Signed)
Controlled, no change in medication Hyperlipidemia:Low fat diet discussed and encouraged.  Updated lab needed at/ before next visit.  

## 2014-07-21 NOTE — Assessment & Plan Note (Signed)
Stable continue aricept

## 2014-07-21 NOTE — Assessment & Plan Note (Signed)
Controlled, no change in medication  

## 2014-07-21 NOTE — Progress Notes (Signed)
   Subjective:    Patient ID: Samuel Ryan, male    DOB: 1931-09-20, 80 y.o.   MRN: [id number]  HPI The PT is here for follow up and re-evaluation of chronic medical conditions, medication management and review of any available recent lab and radiology data.  Preventive health is updated, specifically  Cancer screening and Immunization.   Had both ENT and pu;lmonary evals since last visit. Geenrally feels better, and reports good blood suagr control on glipizide, no hypoglycemia, FBG average around 140 to 150 The PT denies any adverse reactions to current medications since the last visit.  There are no new concerns.  There are no specific complaints       Review of Systems See HPI Denies recent fever or chills. Denies sinus pressure, nasal congestion, ear pain or sore throat. Denies chest congestion, productive cough or wheezing. Denies chest pains, palpitations and leg swelling Denies abdominal pain, nausea, vomiting,diarrhea or constipation.   Denies dysuria, frequency, hesitancy or incontinence. Chronic  joint pain, swelling and limitation in mobility. Denies headaches, seizures, numbness, or tingling. Denies uncontrolled  depression, anxiety or insomnia. Denies skin break down or rash.        Objective:   Physical Exam  BP 134/72  Pulse 62  Resp 18  Ht 5\' 10"  (1.778 m)  Wt 244 lb (110.678 kg)  BMI 35.01 kg/m2  SpO2 95% Patient alert and oriented and in no cardiopulmonary distress.  HEENT: No facial asymmetry, EOMI,   oropharynx pink and moist.  Neck supple no JVD, no mass.  Chest: Clear to auscultation bilaterally.  CVS: S1, S2 no murmurs, no S3.Regular rate.  ABD: Soft non tender.   Ext: No edema  MS: decreased  ROM spine, shoulders, hips and knees.  Skin: Intact, no ulcerations or rash noted.  Psych: Good eye contact, normal affect. Memory loss not anxious or depressed appearing.  CNS: CN 2-12 intact, power,  normal throughout.no focal deficits  noted.       Assessment & Plan:  Hyperlipidemia with target LDL less than 100 Controlled, no change in medication Hyperlipidemia:Low fat diet discussed and encouraged.  \Updated lab needed at/ before next visit.   Insomnia due to anxiety and fear Controlled, no change in medication   Neck muscle spasm Topical porep prescribed  Dementia Stable continue aricept  Depression due to dementia Controlled, no change in medication   BENIGN PROSTATIC HYPERTROPHY, HX OF Urinary stream improved with rapaflo  Need for vaccination with 13-polyvalent pneumococcal conjugate vaccine Administered at visit

## 2014-07-21 NOTE — Assessment & Plan Note (Signed)
Urinary stream improved with rapaflo

## 2014-07-21 NOTE — Assessment & Plan Note (Signed)
Topical porep prescribed

## 2014-07-21 NOTE — Assessment & Plan Note (Signed)
Administered at visit 

## 2014-08-21 ENCOUNTER — Other Ambulatory Visit: Payer: Self-pay | Admitting: Family Medicine

## 2014-09-03 ENCOUNTER — Encounter: Payer: Medicare Other | Admitting: Family Medicine

## 2014-09-05 LAB — HEMOGLOBIN A1C
Hgb A1c MFr Bld: 8.9 % — ABNORMAL HIGH (ref <5.7)
Mean Plasma Glucose: 209 mg/dL — ABNORMAL HIGH (ref <117)

## 2014-09-05 LAB — COMPLETE METABOLIC PANEL WITH GFR
ALT: <8 U/L (ref 0–53)
AST: 10 U/L (ref 0–37)
Albumin: 3.5 g/dL (ref 3.5–5.2)
Alkaline Phosphatase: 54 U/L (ref 39–117)
BILIRUBIN TOTAL: 1 mg/dL (ref 0.2–1.2)
BUN: 11 mg/dL (ref 6–23)
CO2: 25 meq/L (ref 19–32)
Calcium: 8.8 mg/dL (ref 8.4–10.5)
Chloride: 105 mEq/L (ref 96–112)
Creat: 1.39 mg/dL — ABNORMAL HIGH (ref 0.50–1.35)
GFR, EST AFRICAN AMERICAN: 55 mL/min — AB
GFR, EST NON AFRICAN AMERICAN: 47 mL/min — AB
GLUCOSE: 142 mg/dL — AB (ref 70–99)
Potassium: 4.5 mEq/L (ref 3.5–5.3)
Sodium: 139 mEq/L (ref 135–145)
Total Protein: 6.5 g/dL (ref 6.0–8.3)

## 2014-09-05 LAB — LIPID PANEL
CHOL/HDL RATIO: 3.5 ratio
Cholesterol: 133 mg/dL (ref 0–200)
HDL: 38 mg/dL — AB (ref >39)
LDL CALC: 74 mg/dL (ref 0–99)
TRIGLYCERIDES: 107 mg/dL (ref <150)
VLDL: 21 mg/dL (ref 0–40)

## 2014-09-06 ENCOUNTER — Other Ambulatory Visit: Payer: Self-pay | Admitting: Family Medicine

## 2014-09-06 ENCOUNTER — Other Ambulatory Visit: Payer: Self-pay

## 2014-09-06 MED ORDER — LINAGLIPTIN 5 MG PO TABS: 5.0000 mg | ORAL_TABLET | Freq: Every day | ORAL | Status: DC

## 2014-09-09 ENCOUNTER — Encounter: Payer: Self-pay | Admitting: Family Medicine

## 2014-09-09 ENCOUNTER — Telehealth: Payer: Self-pay | Admitting: Family Medicine

## 2014-09-09 ENCOUNTER — Encounter (INDEPENDENT_AMBULATORY_CARE_PROVIDER_SITE_OTHER): Payer: Self-pay

## 2014-09-09 ENCOUNTER — Ambulatory Visit (INDEPENDENT_AMBULATORY_CARE_PROVIDER_SITE_OTHER): Payer: Medicare Other | Admitting: Family Medicine

## 2014-09-09 VITALS — BP 120/80 | HR 71 | Resp 16 | Ht 70.0 in | Wt 246.0 lb

## 2014-09-09 DIAGNOSIS — E1129 Type 2 diabetes mellitus with other diabetic kidney complication: Secondary | ICD-10-CM

## 2014-09-09 DIAGNOSIS — IMO0002 Reserved for concepts with insufficient information to code with codable children: Secondary | ICD-10-CM

## 2014-09-09 DIAGNOSIS — Z0000 Encounter for general adult medical examination without abnormal findings: Secondary | ICD-10-CM

## 2014-09-09 DIAGNOSIS — E1165 Type 2 diabetes mellitus with hyperglycemia: Secondary | ICD-10-CM

## 2014-09-09 DIAGNOSIS — E0789 Other specified disorders of thyroid: Secondary | ICD-10-CM

## 2014-09-09 DIAGNOSIS — Z23 Encounter for immunization: Secondary | ICD-10-CM | POA: Insufficient documentation

## 2014-09-09 LAB — GLUCOSE, POCT (MANUAL RESULT ENTRY): POC Glucose: 204 mg/dl — AB (ref 70–99)

## 2014-09-09 MED ORDER — SILODOSIN 8 MG PO CAPS: 8.0000 mg | ORAL_CAPSULE | Freq: Every day | ORAL | Status: DC

## 2014-09-09 NOTE — Progress Notes (Signed)
Subjective:    Patient ID: Samuel Ryan, male    DOB: 1931/04/24, 80 y.o.   MRN: [id number]  HPI Preventive Screening-Counseling & Management   Patient present here today for a subsequent  Medicare annual wellness visit.   Current Problems (verified)   Medications Prior to Visit Allergies (verified)   PAST HISTORY  Family History (verified)   Social History Married, 2 children, retired. Never smoked    Risk Factors  Current exercise habits: no exercises but he does walk around at home   Dietary issues discussed: Heart healthy diet, eats fruits and vegetables, limits red meats    Cardiac risk factors: diabetes  Depression Screen  (Note: if answer to either of the following is "Yes", a more complete depression screening is indicated)   Over the past two weeks, have you felt down, depressed or hopeless? No  Over the past two weeks, have you felt little interest or pleasure in doing things? No  Have you lost interest or pleasure in daily life? No  Do you often feel hopeless? No  Do you cry easily over simple problems? No   Activities of Daily Living  In your present state of health, do you have any difficulty performing the following activities?  Driving?: doesn't drive  Managing money?: yes, wife helps Feeding yourself?:No Getting from bed to chair?:No Climbing a flight of stairs?: cannot climb stairs  Preparing food and eating?: wife prepares food  Bathing or showering?:No Getting dressed?:No Getting to the toilet?:No Using the toilet?:No Moving around from place to place?: No, uses walker to ambulate   Fall Risk Assessment In the past year have you fallen or had a near fall?:No, but uses assistive device Are you currently taking any medications that make you dizzy?:No   Hearing Difficulties: No Do you often ask people to speak up or repeat themselves?:No Do you experience ringing or noises in your ears?:No Do you have difficulty understanding soft or  whispered voices?:No  Cognitive Testing  Alert? Yes Normal Appearance?Yes  Oriented to person? Yes Place? Yes  Time? Yes  Displays appropriate judgment?Yes  Can read the correct time from a watch face? yes Are you having problems remembering things?yes, on med , short term impaired  Advanced Directives have been discussed with the patient?Yes. Doesn't have a living will at this time  Full code  List the Names of Other Physician/Practitioners you currently use:  Dr Kellie Simmering (urologist)   Indicate any recent Medical Services you may have received from other than [facility] providers in the past year (date may be approximate).   Assessment:    Annual Wellness Exam   Plan:     Medicare Attestation  I have personally reviewed:  The patient's medical and social history  Their use of alcohol, tobacco or illicit drugs  Their current medications and supplements  The patient's functional ability including ADLs,fall risks, home safety risks, cognitive, and hearing and visual impairment  Diet and physical activities  Evidence for depression or mood disorders  The patient's weight, height, BMI, and visual acuity have been recorded in the chart. I have made referrals, counseling, and provided education to the patient based on review of the above and I have provided the patient with a written personalized care plan for preventive services.      Review of Systems     Objective:   Physical Exam BP 120/80  Pulse 71  Resp 16  Ht 5\' 10"  (1.778 m)  Wt 246 lb (111.585 kg)  BMI 35.30 kg/m2  SpO2 95%        Assessment & Plan:  Medicare annual wellness visit, subsequent Annual exam as documented. Counseling done  re healthy lifestyle involving commitment to 150 minutes exercise per week, heart healthy diet, and attaining healthy weight.The importance of adequate sleep also discussed. Regular seat belt use and home safety if patient has them, is also discussed. Changes in health habits are  decided on by the patient with goals and time frames  set for achieving them. Immunization and cancer screening needs are specifically addressed at this visit.   Need for prophylactic vaccination and inoculation against influenza Vaccine administered at visit.

## 2014-09-09 NOTE — Telephone Encounter (Signed)
pls ensure that pt understands that if and when he gets rapaflo , he needs to STOP flomax  pls also see if he can get either the tradgenta preferably with community assistance [program, I know you are already working on this

## 2014-09-09 NOTE — Assessment & Plan Note (Signed)
Vaccine administered at visit.  

## 2014-09-09 NOTE — Patient Instructions (Addendum)
F/u in 3 month, call if you need me before  Blood sugar is increased, speak with nurse about additional medication and getting weekly calls to help with this  Will attempt to get different med for urine flow, rapaflo  Flu vaccine today  HBA1C and chem 7 in 3 month   Fall Prevention and Home Safety Falls cause injuries and can affect all age groups. It is possible to use preventive measures to significantly decrease the likelihood of falls. There are many simple measures which can make your home safer and prevent falls. OUTDOORS  Repair cracks and edges of walkways and driveways.  Remove high doorway thresholds.  Trim shrubbery on the main path into your home.  Have good outside lighting.  Clear walkways of tools, rocks, debris, and clutter.  Check that handrails are not broken and are securely fastened. Both sides of steps should have handrails.  Have leaves, snow, and ice cleared regularly.  Use sand or salt on walkways during winter months.  In the garage, clean up grease or oil spills. BATHROOM  Install night lights.  Install grab bars by the toilet and in the tub and shower.  Use non-skid mats or decals in the tub or shower.  Place a plastic non-slip stool in the shower to sit on, if needed.  Keep floors dry and clean up all water on the floor immediately.  Remove soap buildup in the tub or shower on a regular basis.  Secure bath mats with non-slip, double-sided rug tape.  Remove throw rugs and tripping hazards from the floors. BEDROOMS  Install night lights.  Make sure a bedside light is easy to reach.  Do not use oversized bedding.  Keep a telephone by your bedside.  Have a firm chair with side arms to use for getting dressed.  Remove throw rugs and tripping hazards from the floor. KITCHEN  Keep handles on pots and pans turned toward the center of the stove. Use back burners when possible.  Clean up spills quickly and allow time for  drying.  Avoid walking on wet floors.  Avoid hot utensils and knives.  Position shelves so they are not too high or low.  Place commonly used objects within easy reach.  If necessary, use a sturdy step stool with a grab bar when reaching.  Keep electrical cables out of the way.  Do not use floor polish or wax that makes floors slippery. If you must use wax, use non-skid floor wax.  Remove throw rugs and tripping hazards from the floor. STAIRWAYS  Never leave objects on stairs.  Place handrails on both sides of stairways and use them. Fix any loose handrails. Make sure handrails on both sides of the stairways are as long as the stairs.  Check carpeting to make sure it is firmly attached along stairs. Make repairs to worn or loose carpet promptly.  Avoid placing throw rugs at the top or bottom of stairways, or properly secure the rug with carpet tape to prevent slippage. Get rid of throw rugs, if possible.  Have an electrician put in a light switch at the top and bottom of the stairs. OTHER FALL PREVENTION TIPS  Wear low-heel or rubber-soled shoes that are supportive and fit well. Wear closed toe shoes.  When using a stepladder, make sure it is fully opened and both spreaders are firmly locked. Do not climb a closed stepladder.  Add color or contrast paint or tape to grab bars and handrails in your home. Place contrasting  color strips on first and last steps.  Learn and use mobility aids as needed. Install an electrical emergency response system.  Turn on lights to avoid dark areas. Replace light bulbs that burn out immediately. Get light switches that glow.  Arrange furniture to create clear pathways. Keep furniture in the same place.  Firmly attach carpet with non-skid or double-sided tape.  Eliminate uneven floor surfaces.  Select a carpet pattern that does not visually hide the edge of steps.  Be aware of all pets. OTHER HOME SAFETY TIPS  Set the water temperature  for 120 F (48.8 C).  Keep emergency numbers on or near the telephone.  Keep smoke detectors on every level of the home and near sleeping areas. Document Released: 11/05/2002 Document Revised: 05/16/2012 Document Reviewed: 02/04/2012 [facility] Patient Information 2015 [place], [place]. This information is not intended to replace advice given to you by your health care provider. Make sure you discuss any questions you have with your health care provider.

## 2014-09-09 NOTE — Assessment & Plan Note (Signed)
Annual exam as documented. Counseling done  re healthy lifestyle involving commitment to 150 minutes exercise per week, heart healthy diet, and attaining healthy weight.The importance of adequate sleep also discussed. Regular seat belt use and home safety if patient has them, is also discussed. Changes in health habits are decided on by the patient with goals and time frames  set for achieving them. Immunization and cancer screening needs are specifically addressed at this visit.

## 2014-09-10 ENCOUNTER — Telehealth: Payer: Self-pay | Admitting: Family Medicine

## 2014-09-10 NOTE — Telephone Encounter (Signed)
Called patient and left message for them to return call at the office   

## 2014-09-11 NOTE — Telephone Encounter (Signed)
pts wife aware.

## 2014-09-11 NOTE — Telephone Encounter (Signed)
Patients wife aware to stop the flomax when he starts the rapaflo

## 2014-09-13 ENCOUNTER — Telehealth: Payer: Self-pay

## 2014-09-13 NOTE — Telephone Encounter (Signed)
Fasting sugars 12th- 130 13th- 120 14th-134 15th- 122 16th- 124 This is without the tradjenta. I told him these were very good numbers and to keep up the good work

## 2014-09-13 NOTE — Telephone Encounter (Signed)
I agree, I hope that his meter is working! If these are accurate , he does not need the tradgenta

## 2014-09-13 NOTE — Telephone Encounter (Signed)
Patient aware.

## 2014-09-16 ENCOUNTER — Telehealth: Payer: Self-pay | Admitting: Family Medicine

## 2014-09-16 NOTE — Telephone Encounter (Signed)
Pls contact [facility], lisa Moore re the tradjenta, let her know ot reporting excellent blood sugars without the med at thsi time so we are holding on the request, thanks

## 2014-09-16 NOTE — Telephone Encounter (Signed)
Left Samuel Ryan a message regarding this

## 2014-09-19 ENCOUNTER — Other Ambulatory Visit: Payer: Self-pay | Admitting: Family Medicine

## 2014-09-20 ENCOUNTER — Telehealth: Payer: Self-pay | Admitting: Family Medicine

## 2014-09-20 NOTE — Telephone Encounter (Signed)
Patient advised that his numbers looked good and to continue like he is and I would call back next week

## 2014-09-20 NOTE — Telephone Encounter (Signed)
Blood sugars are 10.17.15 117                               10.18.15  132                               10.19.15  139                               10.20.15  131                                10.21.15  131                                10.22.15  130                                10.23.15 127

## 2014-09-22 ENCOUNTER — Encounter: Payer: Self-pay | Admitting: Family Medicine

## 2014-09-22 NOTE — Assessment & Plan Note (Signed)
Abnormal CXR needs pulmonary eval, referral entered

## 2014-09-22 NOTE — Progress Notes (Signed)
   Subjective:    Patient ID: Samuel Ryan, male    DOB: [birth year], 80 y.o.   MRN: [id number]  HPI P tin for f/u recent hospitalization for CAP, spent 2 days in the hospital, feeling better but still has some cough and shortness of breath No current fever or chills, no sputum production Denies sinus pressure , nasal drainage or sore throat Rept  CXR shown  No current infiltrate but scan is abnormal so he does need pulmonary evaluation Blood sugar is high , often over 200, med adjustment made and an attempt to obtain medication art an affordable price that he needs will be made   Review of Systems    See HPI  Denies chest pains, palpitations and leg swelling Denies abdominal pain, nausea, vomiting,diarrhea or constipation.   Denies dysuria, frequency, hesitancy or incontinence. Chronic  joint pain, swelling and limitation in mobility.Uses assistive device, no falls Denies headaches, seizures, numbness, or tingling. Denies uncontrolled  depression, anxiety or insomnia. Denies skin break down or rash.     Objective:   Physical Exam BP 122/64  Pulse 60  Resp 18  Ht 5\' 10"  (1.778 m)  Wt 244 lb 1.9 oz (110.732 kg)  BMI 35.03 kg/m2  SpO2 95% Patient alert and oriented and in no cardiopulmonary distress.  HEENT: No facial asymmetry, EOMI,   oropharynx pink and moist.  Neck supple no JVD, no mass.  Chest: Adequate air entry, scattered wheezes, no crackles CVS: S1, S2 no murmurs, no S3.Regular rate.  ABD: Soft non tender.   Ext: No edema  MS: decreased though adequate  ROM spine, shoulders, hips and knees.  Skin: Intact, no ulcerations or rash noted.  Psych: Good eye contact, normal affect. Memory intact not anxious or depressed appearing.  CNS: CN 2-12 intact, power,  normal throughout.no focal deficits noted.        Assessment & Plan:  Pneumonia Repeat cxr shows no active infection , but due to abnormal chest scan, referral mfor pulmonary eval is made  CAP  (community acquired pneumonia) rept cxr shows no active airspace disease, however scan is abnormal so he is referred for pulmonary eval  Type 2 diabetes, uncontrolled, with renal manifestation Deteriorated, additonial dose of glipizide added and an attempt to obtain onglyza to help with control Patient advised to reduce carb and sweets, commit to regular physical activity, take meds as prescribed, test blood as directed, and attempt to lose weight, to improve blood sugar control.   Hyperlipidemia with target LDL less than 100 Hyperlipidemia:Low fat diet discussed and encouraged.  Updated lab needed at/ before next visit.   Abnormal CXR (chest x-ray) Abnormal CXR needs pulmonary eval, referral entered  Depression due to dementia Controlled, no change in medication

## 2014-09-22 NOTE — Assessment & Plan Note (Signed)
rept cxr shows no active airspace disease, however scan is abnormal so he is referred for pulmonary eval

## 2014-09-22 NOTE — Assessment & Plan Note (Signed)
Deteriorated, additonial dose of glipizide added and an attempt to obtain onglyza to help with control Patient advised to reduce carb and sweets, commit to regular physical activity, take meds as prescribed, test blood as directed, and attempt to lose weight, to improve blood sugar control.

## 2014-09-22 NOTE — Assessment & Plan Note (Signed)
Hyperlipidemia:Low fat diet discussed and encouraged.  Updated lab needed at/ before next visit.  

## 2014-09-22 NOTE — Assessment & Plan Note (Signed)
Repeat cxr shows no active infection , but due to abnormal chest scan, referral mfor pulmonary eval is made

## 2014-09-22 NOTE — Assessment & Plan Note (Signed)
Controlled, no change in medication  

## 2014-09-23 LAB — HM DIABETES EYE EXAM

## 2014-09-26 ENCOUNTER — Telehealth: Payer: Self-pay

## 2014-09-26 NOTE — Telephone Encounter (Signed)
States his fasting sugar readings have been 126,111,127,128,137. Advised these were good and to keep up the good work and I would call him next week.

## 2014-10-04 ENCOUNTER — Telehealth: Payer: Self-pay

## 2014-10-04 NOTE — Telephone Encounter (Signed)
States fasting sugars have been  112, 140, 135 ,132, 118, 130, 107. Told him these looked good and to continue like he is and I would call him next Friday.

## 2014-10-08 ENCOUNTER — Other Ambulatory Visit: Payer: Self-pay

## 2014-10-09 ENCOUNTER — Other Ambulatory Visit: Payer: Self-pay

## 2014-10-09 MED ORDER — SILODOSIN 8 MG PO CAPS: 8.0000 mg | ORAL_CAPSULE | Freq: Every day | ORAL | Status: DC

## 2014-10-11 ENCOUNTER — Telehealth: Payer: Self-pay

## 2014-10-11 NOTE — Telephone Encounter (Signed)
Fasting sugars have been  135,114,123,94,124,123. Advised these looked good and to continue like he is and call back again next Friday for follow up

## 2014-10-13 ENCOUNTER — Encounter (HOSPITAL_COMMUNITY): Payer: Self-pay

## 2014-10-13 ENCOUNTER — Emergency Department (HOSPITAL_COMMUNITY)
Admission: EM | Admit: 2014-10-13 | Discharge: 2014-10-13 | Disposition: A | Discharge status: Home / Self Care | Payer: Medicare Other | Attending: Emergency Medicine | Admitting: Emergency Medicine

## 2014-10-13 ENCOUNTER — Emergency Department (HOSPITAL_COMMUNITY): Payer: Medicare Other

## 2014-10-13 DIAGNOSIS — M1990 Unspecified osteoarthritis, unspecified site: Secondary | ICD-10-CM | POA: Diagnosis not present

## 2014-10-13 DIAGNOSIS — Z8701 Personal history of pneumonia (recurrent): Secondary | ICD-10-CM | POA: Insufficient documentation

## 2014-10-13 DIAGNOSIS — F0390 Unspecified dementia without behavioral disturbance: Secondary | ICD-10-CM | POA: Diagnosis not present

## 2014-10-13 DIAGNOSIS — R079 Chest pain, unspecified: Secondary | ICD-10-CM | POA: Diagnosis not present

## 2014-10-13 DIAGNOSIS — Z86718 Personal history of other venous thrombosis and embolism: Secondary | ICD-10-CM | POA: Insufficient documentation

## 2014-10-13 DIAGNOSIS — E782 Mixed hyperlipidemia: Secondary | ICD-10-CM | POA: Insufficient documentation

## 2014-10-13 DIAGNOSIS — M25512 Pain in left shoulder: Secondary | ICD-10-CM | POA: Diagnosis not present

## 2014-10-13 DIAGNOSIS — Z7951 Long term (current) use of inhaled steroids: Secondary | ICD-10-CM | POA: Insufficient documentation

## 2014-10-13 DIAGNOSIS — E119 Type 2 diabetes mellitus without complications: Secondary | ICD-10-CM | POA: Diagnosis not present

## 2014-10-13 DIAGNOSIS — I10 Essential (primary) hypertension: Secondary | ICD-10-CM | POA: Insufficient documentation

## 2014-10-13 DIAGNOSIS — Z7952 Long term (current) use of systemic steroids: Secondary | ICD-10-CM | POA: Diagnosis not present

## 2014-10-13 DIAGNOSIS — Z8719 Personal history of other diseases of the digestive system: Secondary | ICD-10-CM | POA: Diagnosis not present

## 2014-10-13 DIAGNOSIS — Z79899 Other long term (current) drug therapy: Secondary | ICD-10-CM | POA: Diagnosis not present

## 2014-10-13 DIAGNOSIS — Z86711 Personal history of pulmonary embolism: Secondary | ICD-10-CM | POA: Insufficient documentation

## 2014-10-13 DIAGNOSIS — N400 Enlarged prostate without lower urinary tract symptoms: Secondary | ICD-10-CM | POA: Insufficient documentation

## 2014-10-13 LAB — BASIC METABOLIC PANEL
Anion gap: 11 (ref 5–15)
BUN: 14 mg/dL (ref 6–23)
CO2: 24 mEq/L (ref 19–32)
Calcium: 8.7 mg/dL (ref 8.4–10.5)
Chloride: 106 mEq/L (ref 96–112)
Creatinine, Ser: 1.53 mg/dL — ABNORMAL HIGH (ref 0.50–1.35)
GFR calc Af Amer: 47 mL/min — ABNORMAL LOW (ref >90)
GFR calc non Af Amer: 41 mL/min — ABNORMAL LOW (ref >90)
Glucose, Bld: 114 mg/dL — ABNORMAL HIGH (ref 70–99)
Potassium: 4.5 mEq/L (ref 3.7–5.3)
Sodium: 141 mEq/L (ref 137–147)

## 2014-10-13 LAB — CBC WITH DIFFERENTIAL/PLATELET
Basophils Absolute: 0 10*3/uL (ref 0.0–0.1)
Basophils Relative: 0 % (ref 0–1)
Eosinophils Absolute: 0.1 10*3/uL (ref 0.0–0.7)
Eosinophils Relative: 3 % (ref 0–5)
HCT: 34.8 % — ABNORMAL LOW (ref 39.0–52.0)
Hemoglobin: 11.7 g/dL — ABNORMAL LOW (ref 13.0–17.0)
Lymphocytes Relative: 35 % (ref 12–46)
Lymphs Abs: 1.4 10*3/uL (ref 0.7–4.0)
MCH: 30.6 pg (ref 26.0–34.0)
MCHC: 33.6 g/dL (ref 30.0–36.0)
MCV: 91.1 fL (ref 78.0–100.0)
Monocytes Absolute: 0.3 10*3/uL (ref 0.1–1.0)
Monocytes Relative: 8 % (ref 3–12)
Neutro Abs: 2.1 10*3/uL (ref 1.7–7.7)
Neutrophils Relative %: 54 % (ref 43–77)
Platelets: 189 10*3/uL (ref 150–400)
RBC: 3.82 MIL/uL — ABNORMAL LOW (ref 4.22–5.81)
RDW: 13.5 % (ref 11.5–15.5)
WBC: 3.9 10*3/uL — ABNORMAL LOW (ref 4.0–10.5)

## 2014-10-13 LAB — TROPONIN I
Troponin I: <0.3 ng/mL (ref <0.30)
Troponin I: <0.3 ng/mL (ref <0.30)

## 2014-10-13 MED ORDER — MORPHINE SULFATE 4 MG/ML IJ SOLN
4.0000 mg | Freq: Once | INTRAMUSCULAR | Status: AC
  Administered 2014-10-13: 4 mg via INTRAVENOUS
  Filled 2014-10-13: qty 1

## 2014-10-13 MED ORDER — OXYCODONE-ACETAMINOPHEN 5-325 MG PO TABS: 1.0000 | ORAL_TABLET | ORAL | Status: DC | PRN

## 2014-10-13 NOTE — ED Notes (Signed)
Pt c/o chest pain since 11am.  Pt has had 4 baby aspirin and 2 nitro.  Pt rates pain at 6.  Says pain got better with nitro.    Pt c/o left sided chest pain that comes and goes and is sharp at times.  Says movement doesn't make it better or worse.

## 2014-10-13 NOTE — ED Provider Notes (Signed)
CSN: [id number]     Arrival date & time 10/13/14  1326 History   First MD Initiated Contact with Patient 10/13/14 1346     Chief Complaint  Patient presents with  . Chest Pain     (Consider location/radiation/quality/duration/timing/severity/associated sxs/prior Treatment) HPI   80 year old male with left chest and left shoulder pain. First noticed last night before going to bed. This morning pain-free. Later in the morning began having similar pain. This again resolved. Just before arrival, he was at church singing when he began having similar pain but stronger in intensity. This time also associated with diaphoresis. Pain is the left anterior chest and left anterior shoulder. Does not radiate. Denies any shortness of breath. No appreciable exacerbating relieving factors. Describes a deep pressure with occasional sharper pain. No numbness or tingling. No nausea. No palpitations. He was given aspirin and nitroglycerin by EMS. Pain is currently somewhat better, but not resolved.does not appear to have a diagnosed history of coronary artery disease. Does have a history of diabetesand hypertension. History of PE/DVT. Currently not on anticoagulation.  Past Medical History  Diagnosis Date  . Type 2 diabetes mellitus   . Mixed hyperlipidemia   . Essential hypertension, benign   . Dementia   . BPH (benign prostatic hypertrophy)   . Pulmonary embolism     Diagnosed 7/12  . Deep venous thrombosis     Left leg, diagnosed 7/12  . Osteoarthritis   . Diverticulitis   . Acute cholecystitis 12/12/2012 to 12/19/2012    drainage tube placed, hosptalizred x 1 week, then nursing home  . Pneumonia    Past Surgical History  Procedure Laterality Date  . Neck fusion    . Carpal tunnel release    . Right total knee replacement  04/23/2011  . Colonoscopy  08/31/2012    Procedure: COLONOSCOPY;  Surgeon: Rogene Houston, MD;  Location: [facility];  Service: Endoscopy;  Laterality: N/A;  830  . Eye  surgery      bilateral cataract  . Hernia repair      umbilical  . Spine surgery      neck fusion   Family History  Problem Relation Age of Onset  . Cancer Mother     Stomach  . Diabetes Sister   . Hypertension Sister   . Diabetes Brother   . Cancer Brother     Gallbladder   History  Substance Use Topics  . Smoking status: Never Smoker   . Smokeless tobacco: Never Used  . Alcohol Use: No    Review of Systems  All systems reviewed and negative, other than as noted in HPI.   Allergies  Vancomycin and Zosyn  Home Medications   Prior to Admission medications   Medication Sig Start Date End Date Taking? Authorizing Provider  acetaminophen (TYLENOL) 500 MG tablet Take 1,000 mg by mouth every 6 (six) hours as needed for moderate pain.   Yes Historical Provider, MD  benazepril (LOTENSIN) 5 MG tablet TAKE ONE (1) TABLET EACH DAY   Yes [name], MD  clobetasol cream (TEMOVATE) 0.09 % Apply 1 application topically 2 (two) times daily. 10/22/13  Yes [name], MD  donepezil (ARICEPT) 10 MG tablet Take 10 mg by mouth at bedtime.   Yes Historical Provider, MD  ferrous sulfate 325 (65 FE) MG tablet Take 325 mg by mouth 2 (two) times daily.   Yes Historical Provider, MD  fluticasone (FLONASE) 50 MCG/ACT nasal spray Place 2 sprays into both  nostrils daily. 01/31/14  Yes [name], MD  glipiZIDE (GLUCOTROL XL) 10 MG 24 hr tablet Two tablets every morning at breakfast 06/20/14  Yes [name], MD  lovastatin (MEVACOR) 40 MG tablet Take 40 mg by mouth at bedtime.   Yes Historical Provider, MD  Polyethylene Glycol 400 (BLINK TEARS OP) Apply 1 drop to eye daily as needed (dry eyes).   Yes Historical Provider, MD  sertraline (ZOLOFT) 50 MG tablet Take 50 mg by mouth daily.   Yes Historical Provider, MD  traMADol (ULTRAM) 50 MG tablet Take 50 mg by mouth 2 (two) times daily as needed for moderate pain.   Yes Historical Provider, MD  Vitamin D, Ergocalciferol,  (DRISDOL) 50000 UNITS CAPS capsule Take 50,000 Units by mouth every 7 (seven) days. Takes on Fridays.   Yes Historical Provider, MD  diclofenac sodium (VOLTAREN) 1 % GEL Apply to neck twice daily, as needed, for pain and spasm Patient not taking: Reported on 10/13/2014 07/16/14   [name], MD  silodosin (RAPAFLO) 8 MG CAPS capsule Take 1 capsule (8 mg total) by mouth daily with breakfast. 10/09/14   [name], MD  traMADol (ULTRAM) 50 MG tablet TAKE ONE TABLET BY MOUTH TWICE DAILY Patient not taking: Reported on 10/13/2014 08/21/14   [name], MD   BP 132/77 mmHg  Resp 14 Physical Exam  Constitutional: He appears well-developed and well-nourished. No distress.  HENT:  Head: Normocephalic and atraumatic.  Eyes: Conjunctivae are normal. Right eye exhibits no discharge. Left eye exhibits no discharge.  Neck: Neck supple.  Cardiovascular: Normal rate, regular rhythm and normal heart sounds.  Exam reveals no gallop and no friction rub.   No murmur heard. Pulmonary/Chest: Effort normal and breath sounds normal. No respiratory distress.  Abdominal: Soft. He exhibits no distension. There is no tenderness.  Musculoskeletal: He exhibits no edema or tenderness.  Lower extremities symmetric as compared to each other. No calf tenderness. Negative Homan's. No palpable cords.   Neurological: He is alert.  Skin: Skin is warm and dry.  Psychiatric: He has a normal mood and affect. His behavior is normal. Thought content normal.  Nursing note and vitals reviewed.   ED Course  Procedures (including critical care time) Labs Review Labs Reviewed  CBC WITH DIFFERENTIAL - Abnormal; Notable for the following:    WBC 3.9 (*)    RBC 3.82 (*)    Hemoglobin 11.7 (*)    HCT 34.8 (*)    All other components within normal limits  BASIC METABOLIC PANEL - Abnormal; Notable for the following:    Glucose, Bld 114 (*)    Creatinine, Ser 1.53 (*)    GFR calc non Af Amer 41 (*)    GFR  calc Af Amer 47 (*)    All other components within normal limits  TROPONIN I    Imaging Review Dg Chest Portable 1 View  10/13/2014   CLINICAL DATA:  Chest pain.  EXAM: PORTABLE CHEST - 1 VIEW  COMPARISON:  05/26/2014  FINDINGS: Patient rotated minimally right. Midline trachea. Mild cardiomegaly. Poor evaluation for pleural effusion secondary to patient size and [facility] portable technique. No pneumothorax. right greater the left bibasilar airspace disease with volume loss. No congestive failure.  IMPRESSION: Cardiomegaly without congestive failure.  Similar bibasilar opacities, favored to represent scarring.   Electronically Signed   By: Abigail Miyamoto M.D.   On: 10/13/2014 14:59     EKG Interpretation   Date/Time:  Sunday October 13 2014 13:36:43 EST Ventricular Rate:  71 PR Interval:  204 QRS Duration: 103 QT Interval:  379 QTC Calculation: 412 R Axis:   81 Text Interpretation:  Sinus rhythm Borderline right axis deviation  Borderline low voltage, extremity leads ED PHYSICIAN INTERPRETATION  AVAILABLE IN [facility] Confirmed by TEST, Record ([id number]) on  10/15/2014 6:57:33 AM      MDM   Final diagnoses:  Chest pain        Virgel Manifold, MD 10/17/14 1150

## 2014-10-13 NOTE — Discharge Instructions (Signed)

## 2014-10-14 ENCOUNTER — Telehealth: Payer: Self-pay

## 2014-10-14 NOTE — Telephone Encounter (Signed)
Patient instructed to call office if pain continues to schedule a visit.

## 2014-10-18 ENCOUNTER — Telehealth: Payer: Self-pay

## 2014-10-18 ENCOUNTER — Other Ambulatory Visit: Payer: Self-pay

## 2014-10-18 ENCOUNTER — Encounter: Payer: Self-pay | Admitting: Family Medicine

## 2014-10-18 ENCOUNTER — Other Ambulatory Visit: Payer: Self-pay | Admitting: Family Medicine

## 2014-10-18 MED ORDER — CLOBETASOL PROPIONATE 0.05 % EX CREA: 1.0000 "application " | TOPICAL_CREAM | Freq: Two times a day (BID) | CUTANEOUS | Status: DC

## 2014-10-18 NOTE — Telephone Encounter (Signed)
Fasting sugars for the week are 108,119,109,104,107,93,121 Advised these were EXCELLENT numbers and to continue like he is and I would follow up again next week.

## 2014-10-28 ENCOUNTER — Telehealth: Payer: Self-pay

## 2014-10-28 DIAGNOSIS — R1013 Epigastric pain: Secondary | ICD-10-CM

## 2014-10-28 MED ORDER — OMEPRAZOLE 40 MG PO CPDR: 40.0000 mg | DELAYED_RELEASE_CAPSULE | Freq: Every day | ORAL | Status: DC

## 2014-10-28 NOTE — Addendum Note (Signed)
Addended by: Eual Fines on: 10/28/2014 03:07 PM   Modules accepted: Orders

## 2014-10-28 NOTE — Telephone Encounter (Signed)
Pt and wife aware- med sent- coming thurs- lab faxed to [facility]

## 2014-10-28 NOTE — Telephone Encounter (Signed)
States he was seen in ER for chest pain 11/15 and it went away for sometime but then come back this am. Hurting on left side. No arm pain or sweating. I advised the ER again but he said it costs too much and he was just seen for the same thing and they told him to call with further concerns to his PCP. They gave him a script for oxycodone but it was torn up because he didn't want to take it. Please advise

## 2014-10-28 NOTE — Telephone Encounter (Signed)
Recommend sending in omeprazole 40 mg daily, advise reduce caffeine intake and needs H pylori. pls get work in visit for f/u thjis week, advise tylenol 500mg  twice daily  Needs tro go to ED if worsens

## 2014-10-31 ENCOUNTER — Ambulatory Visit (INDEPENDENT_AMBULATORY_CARE_PROVIDER_SITE_OTHER): Payer: Medicare Other | Admitting: Family Medicine

## 2014-10-31 ENCOUNTER — Encounter: Payer: Self-pay | Admitting: Family Medicine

## 2014-10-31 VITALS — BP 120/80 | HR 81 | Resp 16 | Ht 70.0 in | Wt 246.0 lb

## 2014-10-31 DIAGNOSIS — E1129 Type 2 diabetes mellitus with other diabetic kidney complication: Secondary | ICD-10-CM

## 2014-10-31 DIAGNOSIS — M542 Cervicalgia: Secondary | ICD-10-CM

## 2014-10-31 DIAGNOSIS — IMO0002 Reserved for concepts with insufficient information to code with codable children: Secondary | ICD-10-CM

## 2014-10-31 DIAGNOSIS — E1165 Type 2 diabetes mellitus with hyperglycemia: Secondary | ICD-10-CM

## 2014-10-31 MED ORDER — PREDNISONE 5 MG PO TABS: 5.0000 mg | ORAL_TABLET | Freq: Two times a day (BID) | ORAL | Status: AC

## 2014-10-31 MED ORDER — METHYLPREDNISOLONE ACETATE 80 MG/ML IJ SUSP
80.0000 mg | Freq: Once | INTRAMUSCULAR | Status: AC
  Administered 2014-10-31: 80 mg via INTRAMUSCULAR

## 2014-10-31 MED ORDER — KETOROLAC TROMETHAMINE 60 MG/2ML IM SOLN
60.0000 mg | Freq: Once | INTRAMUSCULAR | Status: AC
Start: 2014-10-31 — End: 2014-10-31
  Administered 2014-10-31: 60 mg via INTRAMUSCULAR

## 2014-10-31 NOTE — Patient Instructions (Signed)
F/u as before, call if you need me before  Toradol and depo medrol in office for neck, left shoulder and upper arm pain and prednisone for 5 days sent to the pharmacy

## 2014-10-31 NOTE — Assessment & Plan Note (Signed)
Uncontrolled, new onset toradol and depo medrol in poffice followed by prednisone for 5 days

## 2014-10-31 NOTE — Assessment & Plan Note (Signed)
Reportedly improved Patient advised to reduce carb and sweets,  take meds as prescribed, test blood as directed, and attempt to lose weight, to improve blood sugar control. Updated lab needed at/ before next visit.

## 2014-10-31 NOTE — Progress Notes (Signed)
   Subjective:    Patient ID: Samuel Ryan, male    DOB: November 06, 1931, 80 y.o.   MRN: [id number]  HPI Pt in for ED follow up, recently presented with chest pain, left sided, had negative cardiac work up. Now states experiencing pain in upper back radiating to left shoulder and left upper arm Denies weakness or numbness in arm, no recent trauma Reports excellent blood sugars wit fasting sugar generally around 110 in the morning   Review of Systems See HPI Denies recent fever or chills. Denies sinus pressure, nasal congestion, ear pain or sore throat. Denies chest congestion, productive cough or wheezing. Denies chest pains, palpitations and leg swelling Denies abdominal pain, nausea, vomiting,diarrhea or constipation.           Objective:   Physical Exam  BP 120/80 mmHg  Pulse 81  Resp 16  Ht 5\' 10"  (1.778 m)  Wt 246 lb (111.585 kg)  BMI 35.30 kg/m2  SpO2 94%  Patient alert and oriented and in no cardiopulmonary distress.  HEENT: No facial asymmetry, EOMI,   oropharynx pink and moist.  Neck decreased ROM no JVD, no mass.  Chest: Clear to auscultation bilaterally.  CVS: S1, S2 no murmurs, no S3.Regular rate.  ABD: Soft non tender.   Ext: No edema  MS: Decreased ROM spine, shoulder, hips and knees    Psych: Good eye contact, normal affect. Memory impaired not anxious or depressed appearing.  CNS: CN 2-12 intact, power,  normal throughout.no focal deficits noted.       Assessment & Plan:  Neck pain on left side Uncontrolled, new onset toradol and depo medrol in poffice followed by prednisone for 5 days  Type 2 diabetes, uncontrolled, with renal manifestation Reportedly improved Patient advised to reduce carb and sweets,  take meds as prescribed, test blood as directed, and attempt to lose weight, to improve blood sugar control. Updated lab needed at/ before next visit.

## 2014-11-01 LAB — H. PYLORI ANTIBODY, IGG: H Pylori IgG: 0.63 {ISR}

## 2014-11-20 ENCOUNTER — Other Ambulatory Visit: Payer: Self-pay

## 2014-11-20 MED ORDER — LOVASTATIN 40 MG PO TABS: 40.0000 mg | ORAL_TABLET | Freq: Every day | ORAL | Status: DC

## 2014-12-13 ENCOUNTER — Other Ambulatory Visit: Payer: Self-pay

## 2014-12-13 DIAGNOSIS — IMO0002 Reserved for concepts with insufficient information to code with codable children: Secondary | ICD-10-CM

## 2014-12-13 DIAGNOSIS — E1129 Type 2 diabetes mellitus with other diabetic kidney complication: Secondary | ICD-10-CM

## 2014-12-13 DIAGNOSIS — E1165 Type 2 diabetes mellitus with hyperglycemia: Principal | ICD-10-CM

## 2014-12-19 ENCOUNTER — Other Ambulatory Visit: Payer: Self-pay

## 2014-12-19 MED ORDER — VITAMIN D (ERGOCALCIFEROL) 1.25 MG (50000 UNIT) PO CAPS: 50000.0000 [IU] | ORAL_CAPSULE | ORAL | Status: DC

## 2014-12-24 DIAGNOSIS — E1129 Type 2 diabetes mellitus with other diabetic kidney complication: Secondary | ICD-10-CM | POA: Diagnosis not present

## 2014-12-24 DIAGNOSIS — E1165 Type 2 diabetes mellitus with hyperglycemia: Secondary | ICD-10-CM | POA: Diagnosis not present

## 2014-12-25 LAB — BASIC METABOLIC PANEL
BUN: 14 mg/dL (ref 6–23)
CO2: 28 meq/L (ref 19–32)
Calcium: 9.1 mg/dL (ref 8.4–10.5)
Chloride: 103 mEq/L (ref 96–112)
Creat: 1.47 mg/dL — ABNORMAL HIGH (ref 0.50–1.35)
Glucose, Bld: 113 mg/dL — ABNORMAL HIGH (ref 70–99)
POTASSIUM: 4.8 meq/L (ref 3.5–5.3)
SODIUM: 139 meq/L (ref 135–145)

## 2014-12-25 LAB — HEMOGLOBIN A1C
HEMOGLOBIN A1C: 8 % — AB (ref <5.7)
MEAN PLASMA GLUCOSE: 183 mg/dL — AB (ref <117)

## 2014-12-30 ENCOUNTER — Encounter: Payer: Self-pay | Admitting: Family Medicine

## 2014-12-30 ENCOUNTER — Ambulatory Visit (INDEPENDENT_AMBULATORY_CARE_PROVIDER_SITE_OTHER): Payer: Commercial Managed Care - HMO | Admitting: Family Medicine

## 2014-12-30 VITALS — BP 134/60 | HR 60 | Resp 18 | Ht 70.0 in | Wt 250.0 lb

## 2014-12-30 DIAGNOSIS — IMO0002 Reserved for concepts with insufficient information to code with codable children: Secondary | ICD-10-CM

## 2014-12-30 DIAGNOSIS — E559 Vitamin D deficiency, unspecified: Secondary | ICD-10-CM

## 2014-12-30 DIAGNOSIS — F0280 Dementia in other diseases classified elsewhere without behavioral disturbance: Secondary | ICD-10-CM

## 2014-12-30 DIAGNOSIS — E1129 Type 2 diabetes mellitus with other diabetic kidney complication: Secondary | ICD-10-CM

## 2014-12-30 DIAGNOSIS — E1165 Type 2 diabetes mellitus with hyperglycemia: Secondary | ICD-10-CM

## 2014-12-30 DIAGNOSIS — F0281 Dementia in other diseases classified elsewhere with behavioral disturbance: Secondary | ICD-10-CM

## 2014-12-30 DIAGNOSIS — F4321 Adjustment disorder with depressed mood: Secondary | ICD-10-CM

## 2014-12-30 DIAGNOSIS — G309 Alzheimer's disease, unspecified: Secondary | ICD-10-CM

## 2014-12-30 DIAGNOSIS — G308 Other Alzheimer's disease: Secondary | ICD-10-CM

## 2014-12-30 DIAGNOSIS — F329 Major depressive disorder, single episode, unspecified: Secondary | ICD-10-CM

## 2014-12-30 DIAGNOSIS — E785 Hyperlipidemia, unspecified: Secondary | ICD-10-CM

## 2014-12-30 DIAGNOSIS — F5105 Insomnia due to other mental disorder: Secondary | ICD-10-CM

## 2014-12-30 DIAGNOSIS — E049 Nontoxic goiter, unspecified: Secondary | ICD-10-CM

## 2014-12-30 DIAGNOSIS — F409 Phobic anxiety disorder, unspecified: Secondary | ICD-10-CM

## 2014-12-30 DIAGNOSIS — F0393 Unspecified dementia, unspecified severity, with mood disturbance: Secondary | ICD-10-CM

## 2014-12-30 MED ORDER — MIRTAZAPINE 7.5 MG PO TABS: 7.5000 mg | ORAL_TABLET | Freq: Every day | ORAL | Status: DC

## 2014-12-30 NOTE — Progress Notes (Signed)
Subjective:    Patient ID: Samuel Ryan, male    DOB: 07/11/1931, 81 y.o.   MRN: [id number]  HPI  The PT is here for follow up and re-evaluation of chronic medical conditions, medication management and review of any available recent lab and radiology data.  Preventive health is updated, specifically  Cancer screening and Immunization.   Questions or concerns regarding consultations or procedures which the PT has had in the interim are  Addressed.He unfortunately recently lost his wife after a brief illness, states he is getting a lot of help from his family, but I do believe that if possible , he will benefit from help through [facility], financially unable to  At this time. States he is "taking it well" however, I fear that as time goes by and the reality of his loss sets in, he will be challenged, independent living , with mild dementia is certainly not in his best interest The PT denies any adverse reactions to current medications since the last visit.  Denies polyuria, polydipsia, blurred vision , or hypoglycemic episodes.      Review of Systems See HPI Denies recent fever or chills. Denies sinus pressure, nasal congestion, ear pain or sore throat. Denies chest congestion, productive cough or wheezing. Denies chest pains, palpitations and leg swelling Denies abdominal pain, nausea, vomiting,diarrhea or constipation.   Denies dysuria, frequency, hesitancy or incontinence. Denies  Any change in his joint pain, swelling and limitation in mobility. Denies headaches, seizures, numbness, or tingling. Mild depresison and difficulty sleeeping due to recent loss of his wife, not suicidal or hoimicidal Denies skin break down or rash.        Objective:   Physical Exam  BP 134/60 mmHg  Pulse 60  Resp 18  Ht 5\' 10"  (1.778 m)  Wt 250 lb 0.6 oz (113.417 kg)  BMI 35.88 kg/m2  SpO2 90% Patient alert and oriented and in no cardiopulmonary distress.  HEENT: No facial asymmetry, EOMI,    oropharynx pink and moist.  Neck decreased ROM no JVD, no mass.  Chest: Clear to auscultation bilaterally.  CVS: S1, S2 no murmurs, no S3.Regular rate.  ABD: Soft non tender.   Ext: No edema  MS: decreased ROM spine , hips and knees unable to safely ambulate independently  Skin: Intact, no ulcerations or rash noted.  Psych: Good eye contact, normal affect. Memory impaired  not anxious mildly  depressed appearing.  CNS: CN 2-12 intact, power,  normal throughout.no focal deficits noted.       Assessment & Plan:  Type 2 diabetes, uncontrolled, with renal manifestation Improved and controlled Patient educated about the importance of limiting  Carbohydrate intake , the need to commit to daily physical activity for a minimum of 30 minutes , and to commit weight loss. The fact that changes in all these areas will reduce or eliminate all together the development of diabetes is stressed.   Diabetic Labs Latest Ref Rng 02/13/2015 12/24/2014 10/13/2014 09/05/2014 07/16/2014  HbA1c <5.7 % - 8.0(H) - 8.9(H) -  Microalbumin 0.00 - 1.89 mg/dL - - - - 1.63  Micro/Creat Ratio 0.0 - 30.0 mg/g - - - - 12.1  Chol 0 - 200 mg/dL - - - 133 -  HDL >39 mg/dL - - - 38(L) -  Calc LDL 0 - 99 mg/dL - - - 74 -  Triglycerides <150 mg/dL - - - 107 -  Creatinine 0.50 - 1.35 mg/dL 1.66(H) 1.47(H) 1.53(H) 1.39(H) -   BP/Weight 02/13/2015 12/30/2014  10/31/2014 10/13/2014 09/09/2014 9/56/2130 07/05/5783  Systolic BP 696 295 284 132 440 102 725  Diastolic BP 73 60 80 73 80 72 76  Wt. (Lbs) 264 250.04 246 - 246 244 241.8  BMI 35.8 35.88 35.3 - 35.3 35.01 34.69   Foot/eye exam completion dates Latest Ref Rng 09/23/2014 07/16/2014  Eye Exam No Retinopathy No Retinopathy -  Foot Form Completion - - Done        Depression due to dementia Current deterioration due to recent unexpected loss of his spouse, doing better than expected, but anticipate some decompensation as his new reality sets in. Offer made to secure  additional help oin the home, now relatives, none of whom actually live with hjim are providing assistance Remeron added to help wit sleep and depression   Hyperlipidemia with target LDL less than 100 Controlled, no change in medication,  Updated lab needed at/ before next visit.  Hyperlipidemia:Low fat diet discussed and encouraged.  CMP Latest Ref Rng 02/13/2015 12/24/2014 10/13/2014  Glucose 70 - 99 mg/dL 176(H) 113(H) 114(H)  BUN 6 - 23 mg/dL 14 14 14   Creatinine 0.50 - 1.35 mg/dL 1.66(H) 1.47(H) 1.53(H)  Sodium 135 - 145 mmol/L 137 139 141  Potassium 3.5 - 5.1 mmol/L 4.2 4.8 4.5  Chloride 96 - 112 mmol/L 105 103 106  CO2 19 - 32 mmol/L 27 28 24   Calcium 8.4 - 10.5 mg/dL 8.5 9.1 8.7  Total Protein 6.0 - 8.3 g/dL 7.2 - -  Total Bilirubin 0.3 - 1.2 mg/dL 0.8 - -  Alkaline Phos 39 - 117 U/L 68 - -  AST 0 - 37 U/L 14 - -  ALT 0 - 53 U/L 10 - -    Lipid Panel     Component Value Date/Time   CHOL 133 09/05/2014 0727   TRIG 107 09/05/2014 0727   HDL 38* 09/05/2014 0727   CHOLHDL 3.5 09/05/2014 0727   VLDL 21 09/05/2014 0727   LDLCALC 74 09/05/2014 0727         Dementia of Alzheimer's type with behavioral disturbance Currently stable on aricept, however, due to recent change is marital staus, now a widower , living on his own, I am very concerned re his ability to continue thois and recommend in home help I may need o reach out directly to his son to see what alternate living arrangement s can be made   Grief reaction Coping well despite recent loss of spouse , will need close follow up an ddirect conversation with his son for long term care however

## 2014-12-30 NOTE — Patient Instructions (Addendum)
F/u in 3 month, call if you need me before   New medcation at bedtime, remron one at night  Blood sugar has improved,keep it up  Fasting lipid, cmp and EGFr, hBa1c, TSh , vit D in 3 month  Our condolence and you are in our thoughts and prayers  Be careful not to fall ,and let us know if you are unable to get additional help

## 2015-01-29 ENCOUNTER — Other Ambulatory Visit: Payer: Self-pay

## 2015-01-29 MED ORDER — OMEPRAZOLE 40 MG PO CPDR: 40.0000 mg | DELAYED_RELEASE_CAPSULE | Freq: Every day | ORAL | Status: DC

## 2015-02-13 ENCOUNTER — Emergency Department (HOSPITAL_COMMUNITY)
Admission: EM | Admit: 2015-02-13 | Discharge: 2015-02-13 | Disposition: A | Discharge status: Home / Self Care | Payer: Commercial Managed Care - HMO | Attending: Emergency Medicine | Admitting: Emergency Medicine

## 2015-02-13 ENCOUNTER — Other Ambulatory Visit: Payer: Self-pay | Admitting: Family Medicine

## 2015-02-13 ENCOUNTER — Other Ambulatory Visit: Payer: Self-pay

## 2015-02-13 ENCOUNTER — Emergency Department (HOSPITAL_COMMUNITY): Payer: Commercial Managed Care - HMO

## 2015-02-13 ENCOUNTER — Encounter (HOSPITAL_COMMUNITY): Payer: Self-pay | Admitting: Emergency Medicine

## 2015-02-13 ENCOUNTER — Telehealth: Payer: Self-pay

## 2015-02-13 DIAGNOSIS — Z8719 Personal history of other diseases of the digestive system: Secondary | ICD-10-CM | POA: Insufficient documentation

## 2015-02-13 DIAGNOSIS — I10 Essential (primary) hypertension: Secondary | ICD-10-CM | POA: Insufficient documentation

## 2015-02-13 DIAGNOSIS — R609 Edema, unspecified: Secondary | ICD-10-CM | POA: Diagnosis not present

## 2015-02-13 DIAGNOSIS — R05 Cough: Secondary | ICD-10-CM | POA: Diagnosis not present

## 2015-02-13 DIAGNOSIS — Z8701 Personal history of pneumonia (recurrent): Secondary | ICD-10-CM | POA: Insufficient documentation

## 2015-02-13 DIAGNOSIS — Z86711 Personal history of pulmonary embolism: Secondary | ICD-10-CM | POA: Insufficient documentation

## 2015-02-13 DIAGNOSIS — Z86718 Personal history of other venous thrombosis and embolism: Secondary | ICD-10-CM | POA: Diagnosis not present

## 2015-02-13 DIAGNOSIS — R5383 Other fatigue: Secondary | ICD-10-CM | POA: Insufficient documentation

## 2015-02-13 DIAGNOSIS — F0390 Unspecified dementia without behavioral disturbance: Secondary | ICD-10-CM | POA: Diagnosis not present

## 2015-02-13 DIAGNOSIS — J40 Bronchitis, not specified as acute or chronic: Secondary | ICD-10-CM | POA: Insufficient documentation

## 2015-02-13 DIAGNOSIS — E119 Type 2 diabetes mellitus without complications: Secondary | ICD-10-CM | POA: Diagnosis not present

## 2015-02-13 DIAGNOSIS — Z79899 Other long term (current) drug therapy: Secondary | ICD-10-CM | POA: Insufficient documentation

## 2015-02-13 DIAGNOSIS — Z792 Long term (current) use of antibiotics: Secondary | ICD-10-CM | POA: Diagnosis not present

## 2015-02-13 DIAGNOSIS — Z7952 Long term (current) use of systemic steroids: Secondary | ICD-10-CM | POA: Diagnosis not present

## 2015-02-13 DIAGNOSIS — R0682 Tachypnea, not elsewhere classified: Secondary | ICD-10-CM | POA: Diagnosis not present

## 2015-02-13 DIAGNOSIS — M1990 Unspecified osteoarthritis, unspecified site: Secondary | ICD-10-CM | POA: Diagnosis not present

## 2015-02-13 DIAGNOSIS — R531 Weakness: Secondary | ICD-10-CM | POA: Diagnosis not present

## 2015-02-13 DIAGNOSIS — Z87448 Personal history of other diseases of urinary system: Secondary | ICD-10-CM | POA: Insufficient documentation

## 2015-02-13 LAB — COMPREHENSIVE METABOLIC PANEL
ALBUMIN: 3.5 g/dL (ref 3.5–5.2)
ALT: 10 U/L (ref 0–53)
ANION GAP: 5 (ref 5–15)
AST: 14 U/L (ref 0–37)
Alkaline Phosphatase: 68 U/L (ref 39–117)
BILIRUBIN TOTAL: 0.8 mg/dL (ref 0.3–1.2)
BUN: 14 mg/dL (ref 6–23)
CO2: 27 mmol/L (ref 19–32)
Calcium: 8.5 mg/dL (ref 8.4–10.5)
Chloride: 105 mmol/L (ref 96–112)
Creatinine, Ser: 1.66 mg/dL — ABNORMAL HIGH (ref 0.50–1.35)
GFR, EST AFRICAN AMERICAN: 43 mL/min — AB (ref >90)
GFR, EST NON AFRICAN AMERICAN: 37 mL/min — AB (ref >90)
GLUCOSE: 176 mg/dL — AB (ref 70–99)
POTASSIUM: 4.2 mmol/L (ref 3.5–5.1)
Sodium: 137 mmol/L (ref 135–145)
Total Protein: 7.2 g/dL (ref 6.0–8.3)

## 2015-02-13 LAB — URINE MICROSCOPIC-ADD ON

## 2015-02-13 LAB — CBC WITH DIFFERENTIAL/PLATELET
BASOS ABS: 0 10*3/uL (ref 0.0–0.1)
Basophils Relative: 0 % (ref 0–1)
Eosinophils Absolute: 0.2 10*3/uL (ref 0.0–0.7)
Eosinophils Relative: 3 % (ref 0–5)
HEMATOCRIT: 34.5 % — AB (ref 39.0–52.0)
Hemoglobin: 11.4 g/dL — ABNORMAL LOW (ref 13.0–17.0)
Lymphocytes Relative: 19 % (ref 12–46)
Lymphs Abs: 1.3 10*3/uL (ref 0.7–4.0)
MCH: 30.9 pg (ref 26.0–34.0)
MCHC: 33 g/dL (ref 30.0–36.0)
MCV: 93.5 fL (ref 78.0–100.0)
MONO ABS: 0.6 10*3/uL (ref 0.1–1.0)
MONOS PCT: 9 % (ref 3–12)
Neutro Abs: 4.9 10*3/uL (ref 1.7–7.7)
Neutrophils Relative %: 69 % (ref 43–77)
Platelets: 145 10*3/uL — ABNORMAL LOW (ref 150–400)
RBC: 3.69 MIL/uL — AB (ref 4.22–5.81)
RDW: 13 % (ref 11.5–15.5)
WBC: 7 10*3/uL (ref 4.0–10.5)

## 2015-02-13 LAB — BRAIN NATRIURETIC PEPTIDE: B Natriuretic Peptide: 37 pg/mL (ref 0.0–100.0)

## 2015-02-13 LAB — URINALYSIS, ROUTINE W REFLEX MICROSCOPIC
Bilirubin Urine: NEGATIVE
Glucose, UA: NEGATIVE mg/dL
KETONES UR: NEGATIVE mg/dL
LEUKOCYTES UA: NEGATIVE
Nitrite: NEGATIVE
PH: 6 (ref 5.0–8.0)
PROTEIN: NEGATIVE mg/dL
Specific Gravity, Urine: 1.015 (ref 1.005–1.030)
UROBILINOGEN UA: 0.2 mg/dL (ref 0.0–1.0)

## 2015-02-13 LAB — TROPONIN I: TROPONIN I: 0.03 ng/mL (ref <0.031)

## 2015-02-13 MED ORDER — AZITHROMYCIN 250 MG PO TABS: ORAL_TABLET | ORAL | Status: DC

## 2015-02-13 MED ORDER — BENZONATATE 100 MG PO CAPS: 100.0000 mg | ORAL_CAPSULE | Freq: Two times a day (BID) | ORAL | Status: DC | PRN

## 2015-02-13 NOTE — ED Notes (Signed)
Patient states nasal congestion, weakness, and "not feeling well" worsening symptoms today. Patient is on zpack from PCP

## 2015-02-13 NOTE — ED Notes (Signed)
Patient and family verbalize understanding of discharge instructions, home care and follow up care. Patient out of department at this time with family. 

## 2015-02-13 NOTE — ED Provider Notes (Signed)
CSN: [id number]     Arrival date & time 02/13/15  1937 History  This chart was scribed for Milton Ferguson, MD by Molli Posey, ED Scribe. This patient was seen in room [facility] and the patient's care was started 7:55 PM.    Chief Complaint  Patient presents with  . Weakness   Patient is a 81 y.o. male presenting with weakness. The history is provided by the patient. No language interpreter was used.  Weakness The current episode started 12 to 24 hours ago. The problem occurs constantly. The problem has been gradually worsening. Pertinent negatives include no chest pain, no abdominal pain and no headaches. Nothing aggravates the symptoms. Nothing relieves the symptoms. He has tried nothing for the symptoms.   HPI Comments: Samuel Ryan is a 81 y.o. male with a history of DM, dementia and PE who presents to the Emergency Department complaining of generalized weakness that started today. His son states that pt has started experiencing congestion and a productive cough with clear phlegm today. His son reports no modifying or alleviating factors at this time. He denies fever and chills.    Past Medical History  Diagnosis Date  . Type 2 diabetes mellitus   . Mixed hyperlipidemia   . Essential hypertension, benign   . Dementia   . BPH (benign prostatic hypertrophy)   . Pulmonary embolism     Diagnosed 7/12  . Deep venous thrombosis     Left leg, diagnosed 7/12  . Osteoarthritis   . Diverticulitis   . Acute cholecystitis 12/12/2012 to 12/19/2012    drainage tube placed, hosptalizred x 1 week, then nursing home  . Pneumonia    Past Surgical History  Procedure Laterality Date  . Neck fusion    . Carpal tunnel release    . Right total knee replacement  04/23/2011  . Colonoscopy  08/31/2012    Procedure: COLONOSCOPY;  Surgeon: Rogene Houston, MD;  Location: [facility];  Service: Endoscopy;  Laterality: N/A;  830  . Eye surgery      bilateral cataract  . Hernia repair       umbilical  . Spine surgery      neck fusion   Family History  Problem Relation Age of Onset  . Cancer Mother     Stomach  . Diabetes Sister   . Hypertension Sister   . Diabetes Brother   . Cancer Brother     Gallbladder   History  Substance Use Topics  . Smoking status: Never Smoker   . Smokeless tobacco: Never Used  . Alcohol Use: No    Review of Systems  Constitutional: Positive for fatigue. Negative for appetite change.  HENT: Positive for congestion. Negative for ear discharge and sinus pressure.   Eyes: Negative for discharge.  Respiratory: Positive for cough.   Cardiovascular: Negative for chest pain.  Gastrointestinal: Negative for abdominal pain and diarrhea.  Genitourinary: Negative for frequency and hematuria.  Musculoskeletal: Negative for back pain.  Skin: Negative for rash.  Neurological: Positive for weakness. Negative for seizures and headaches.  Psychiatric/Behavioral: Negative for hallucinations.    Allergies  Vancomycin and Zosyn  Home Medications   Prior to Admission medications   Medication Sig Start Date End Date Taking? Authorizing Provider  acetaminophen (TYLENOL) 500 MG tablet Take 1,000 mg by mouth every 6 (six) hours as needed for moderate pain.    Historical Provider, MD  azithromycin (ZITHROMAX) 250 MG tablet Two tablets on day one , then one tablet  once daily for an additional four days 02/13/15   [name], MD  benazepril (LOTENSIN) 5 MG tablet TAKE ONE (1) TABLET EACH DAY 10/18/14   [name], MD  benzonatate (TESSALON) 100 MG capsule Take 1 capsule (100 mg total) by mouth 2 (two) times daily as needed for cough. 02/13/15   [name], MD  clobetasol cream (TEMOVATE) 1.76 % Apply 1 application topically 2 (two) times daily. 10/18/14   [name], MD  donepezil (ARICEPT) 10 MG tablet TAKE ONE (1) TABLET AT BEDTIME 10/18/14   [name], MD  ferrous sulfate 325 (65 FE) MG tablet TAKE ONE TABLET BY  MOUTH TWICE DAILY 10/18/14   [name], MD  fluticasone [facility]) 50 MCG/ACT nasal spray Place 2 sprays into both nostrils daily. 01/31/14   [name], MD  glipiZIDE (GLUCOTROL XL) 10 MG 24 hr tablet Two tablets every morning at breakfast 06/20/14   [name], MD  lovastatin (MEVACOR) 40 MG tablet Take 1 tablet (40 mg total) by mouth at bedtime. 11/20/14   [name], MD  mirtazapine (REMERON) 7.5 MG tablet Take 1 tablet (7.5 mg total) by mouth at bedtime. 12/30/14   [name], MD  omeprazole (PRILOSEC) 40 MG capsule Take 1 capsule (40 mg total) by mouth daily. 01/29/15   [name], MD  Polyethylene Glycol 400 (BLINK TEARS OP) Apply 1 drop to eye daily as needed (dry eyes).    Historical Provider, MD  sertraline (ZOLOFT) 50 MG tablet TAKE ONE (1) TABLET EACH DAY 10/18/14   [name], MD  tamsulosin (FLOMAX) 0.4 MG CAPS capsule Take 0.4 mg by mouth daily.    Historical Provider, MD  traMADol (ULTRAM) 50 MG tablet TAKE ONE TABLET BY MOUTH TWICE DAILY 08/21/14   [name], MD  Vitamin D, Ergocalciferol, (DRISDOL) 50000 UNITS CAPS capsule Take 1 capsule (50,000 Units total) by mouth every 7 (seven) days. Takes on Fridays. 12/19/14   [name], MD   BP 142/85 mmHg  Pulse 72  Temp(Src) 98 F (36.7 C)  Resp 17  Ht 6' (1.829 m)  Wt 264 lb (119.75 kg)  BMI 35.80 kg/m2  SpO2 93% Physical Exam  Constitutional: He is oriented to person, place, and time. He appears well-developed.  HENT:  Head: Normocephalic.  Eyes: Conjunctivae and EOM are normal. No scleral icterus.  Neck: Neck supple. No thyromegaly present.  Cardiovascular: Normal rate and regular rhythm.  Exam reveals no gallop and no friction rub.   No murmur heard. Pulmonary/Chest: No stridor. He has wheezes. He has no rales. He exhibits no tenderness.  Minimal wheezing bilaterally.   Abdominal: He exhibits no distension. There is no tenderness. There is no rebound.   Musculoskeletal: Normal range of motion. He exhibits edema.  1+ edema to ankles.   Lymphadenopathy:    He has no cervical adenopathy.  Neurological: He is oriented to person, place, and time. He exhibits normal muscle tone. Coordination normal.  Skin: No rash noted. No erythema.  Psychiatric: He has a normal mood and affect. His behavior is normal.  Nursing note and vitals reviewed.   ED Course  Procedures   DIAGNOSTIC STUDIES: Oxygen Saturation is 93% on RA, normal by my interpretation.    COORDINATION OF CARE: 7:57 PM Discussed treatment plan with pt at bedside and pt agreed to plan.   Labs Review Labs Reviewed  CBC WITH DIFFERENTIAL/PLATELET - Abnormal; Notable for the following:  RBC 3.69 (*)    Hemoglobin 11.4 (*)    HCT 34.5 (*)    Platelets 145 (*)    All other components within normal limits  COMPREHENSIVE METABOLIC PANEL - Abnormal; Notable for the following:    Glucose, Bld 176 (*)    Creatinine, Ser 1.66 (*)    GFR calc non Af Amer 37 (*)    GFR calc Af Amer 43 (*)    All other components within normal limits  TROPONIN I  BRAIN NATRIURETIC PEPTIDE  URINALYSIS, ROUTINE W REFLEX MICROSCOPIC    Imaging Review Dg Chest Portable 1 View  02/13/2015   CLINICAL DATA:  Acute onset of generalized weakness and productive cough. Initial encounter.  EXAM: PORTABLE CHEST - 1 VIEW  COMPARISON:  Chest radiograph performed 10/13/2014  FINDINGS: The lungs are well-aerated. Vascular congestion is noted, with increased interstitial markings, raising concern for mild interstitial edema. Mild right basilar atelectasis is noted. No definite pleural effusion or pneumothorax is seen.  The cardiomediastinal silhouette is mildly enlarged. No acute osseous abnormalities are seen.  IMPRESSION: Vascular congestion and mild cardiomegaly, with increased interstitial markings, raising concern for mild interstitial edema.   Electronically Signed   By: Garald Balding M.D.   On: 02/13/2015 20:08      EKG Interpretation None      MDM   Final diagnoses:  None  bronchitis,  zpak  The chart was scribed for me under my direct supervision.  I personally performed the history, physical, and medical decision making and all procedures in the evaluation of this patient.Milton Ferguson, MD 02/13/15 [phone]

## 2015-02-13 NOTE — Telephone Encounter (Signed)
Z pack and tessalon perles are sent in please let him know

## 2015-02-13 NOTE — Telephone Encounter (Signed)
C/o coughing up thick yellow phlegm and yellow sinus congestion x 2 days. No fever chills or bodyaches. Wants to know if something can be called in to the drug store in [place]

## 2015-02-13 NOTE — Telephone Encounter (Signed)
Pt aware.

## 2015-02-13 NOTE — Discharge Instructions (Signed)
Take the antibiotic your md prescribed today and follow up next week.  Drink plenty of fluids.

## 2015-02-13 NOTE — ED Notes (Signed)
Pt c/o worsening weakness, cough, rt arm pain and he recently started zpack by pcp.

## 2015-02-20 ENCOUNTER — Ambulatory Visit: Payer: Commercial Managed Care - HMO | Admitting: Family Medicine

## 2015-02-24 ENCOUNTER — Other Ambulatory Visit: Payer: Self-pay

## 2015-02-24 ENCOUNTER — Telehealth: Payer: Self-pay | Admitting: Family Medicine

## 2015-02-24 DIAGNOSIS — F4320 Adjustment disorder, unspecified: Secondary | ICD-10-CM | POA: Insufficient documentation

## 2015-02-24 DIAGNOSIS — F4321 Adjustment disorder with depressed mood: Secondary | ICD-10-CM | POA: Insufficient documentation

## 2015-02-24 MED ORDER — FERROUS SULFATE 325 (65 FE) MG PO TABS: 325.0000 mg | ORAL_TABLET | Freq: Two times a day (BID) | ORAL | Status: DC

## 2015-02-24 MED ORDER — BENAZEPRIL HCL 5 MG PO TABS: ORAL_TABLET | ORAL | Status: DC

## 2015-02-24 MED ORDER — TRAMADOL HCL 50 MG PO TABS: 50.0000 mg | ORAL_TABLET | Freq: Two times a day (BID) | ORAL | Status: DC

## 2015-02-24 MED ORDER — SERTRALINE HCL 50 MG PO TABS: ORAL_TABLET | ORAL | Status: DC

## 2015-02-24 NOTE — Assessment & Plan Note (Addendum)
Controlled, no change in medication,  Updated lab needed at/ before next visit.  Hyperlipidemia:Low fat diet discussed and encouraged.  CMP Latest Ref Rng 02/13/2015 12/24/2014 10/13/2014  Glucose 70 - 99 mg/dL 176(H) 113(H) 114(H)  BUN 6 - 23 mg/dL 14 14 14   Creatinine 0.50 - 1.35 mg/dL 1.66(H) 1.47(H) 1.53(H)  Sodium 135 - 145 mmol/L 137 139 141  Potassium 3.5 - 5.1 mmol/L 4.2 4.8 4.5  Chloride 96 - 112 mmol/L 105 103 106  CO2 19 - 32 mmol/L 27 28 24   Calcium 8.4 - 10.5 mg/dL 8.5 9.1 8.7  Total Protein 6.0 - 8.3 g/dL 7.2 - -  Total Bilirubin 0.3 - 1.2 mg/dL 0.8 - -  Alkaline Phos 39 - 117 U/L 68 - -  AST 0 - 37 U/L 14 - -  ALT 0 - 53 U/L 10 - -    Lipid Panel     Component Value Date/Time   CHOL 133 09/05/2014 0727   TRIG 107 09/05/2014 0727   HDL 38* 09/05/2014 0727   CHOLHDL 3.5 09/05/2014 0727   VLDL 21 09/05/2014 0727   LDLCALC 74 09/05/2014 0727

## 2015-02-24 NOTE — Assessment & Plan Note (Signed)
Improved and controlled Patient educated about the importance of limiting  Carbohydrate intake , the need to commit to daily physical activity for a minimum of 30 minutes , and to commit weight loss. The fact that changes in all these areas will reduce or eliminate all together the development of diabetes is stressed.   Diabetic Labs Latest Ref Rng 02/13/2015 12/24/2014 10/13/2014 09/05/2014 07/16/2014  HbA1c <5.7 % - 8.0(H) - 8.9(H) -  Microalbumin 0.00 - 1.89 mg/dL - - - - 1.63  Micro/Creat Ratio 0.0 - 30.0 mg/g - - - - 12.1  Chol 0 - 200 mg/dL - - - 133 -  HDL >39 mg/dL - - - 38(L) -  Calc LDL 0 - 99 mg/dL - - - 74 -  Triglycerides <150 mg/dL - - - 107 -  Creatinine 0.50 - 1.35 mg/dL 1.66(H) 1.47(H) 1.53(H) 1.39(H) -   BP/Weight 02/13/2015 12/30/2014 10/31/2014 10/13/2014 09/09/2014 06/17/9469 08/05/2835  Systolic BP 629 476 546 503 546 568 127  Diastolic BP 73 60 80 73 80 72 76  Wt. (Lbs) 264 250.04 246 - 246 244 241.8  BMI 35.8 35.88 35.3 - 35.3 35.01 34.69   Foot/eye exam completion dates Latest Ref Rng 09/23/2014 07/16/2014  Eye Exam No Retinopathy No Retinopathy -  Foot Form Completion - - Done

## 2015-02-24 NOTE — Assessment & Plan Note (Signed)
Coping well despite recent loss of spouse , will need close follow up an ddirect conversation with his son for long term care however

## 2015-02-24 NOTE — Assessment & Plan Note (Signed)
Currently stable on aricept, however, due to recent change is marital staus, now a widower , living on his own, I am very concerned re his ability to continue thois and recommend in home help I may need o reach out directly to his son to see what alternate living arrangement s can be made

## 2015-02-24 NOTE — Assessment & Plan Note (Addendum)
Current deterioration due to recent unexpected loss of his spouse, doing better than expected, but anticipate some decompensation as his new reality sets in. Offer made to secure additional help oin the home, now relatives, none of whom actually live with hjim are providing assistance Remeron added to help wit sleep and depression

## 2015-02-25 ENCOUNTER — Telehealth: Payer: Self-pay | Admitting: *Deleted

## 2015-02-25 MED ORDER — BENZONATATE 100 MG PO CAPS: 100.0000 mg | ORAL_CAPSULE | Freq: Two times a day (BID) | ORAL | Status: DC | PRN

## 2015-02-25 NOTE — Telephone Encounter (Signed)
1 fill only. Pt to call back if symptoms don't improve

## 2015-02-25 NOTE — Telephone Encounter (Signed)
Pt called requesting to speak with a nurse please advise

## 2015-02-27 ENCOUNTER — Telehealth: Payer: Self-pay | Admitting: *Deleted

## 2015-02-27 NOTE — Telephone Encounter (Signed)
Pt called requesting to speak to [place]. Please advise

## 2015-02-27 NOTE — Telephone Encounter (Signed)
Spoke with patient and he had questions on how to set up mail order pharmacy

## 2015-02-27 NOTE — Telephone Encounter (Signed)
Concern addressed in next telephone message

## 2015-03-04 ENCOUNTER — Other Ambulatory Visit: Payer: Self-pay | Admitting: Licensed Clinical Social Worker

## 2015-03-04 NOTE — Patient Outreach (Signed)
Assessment: Previous documentation in [facility] record.  CSW called home phone number for client on 03/04/15 and spoke via phone with client.  CSW verified identity of client.  CSW and [place] discussed current needs of client. Client said he had begun to receive his prescribed medications via mail delivery.  He said he thought this would be a more economical way to receive his prescribed medications.  He said he receives support from family, friends and church friends.  He said he has cousins who check in on him about two times weekly to see that his needs are met.  He said his family members help with his tranpsort needs to and from scheduled medical appointments. He said he was eating adequately and sleeping well. CSW talked with Nimesh about community support agencies such as food pantries. [place] said he was not accessing any community support help at present.  Client said he receives assistance from a home health aide who assists him in the home for three hours daily for 5 days per week. He said that the assistance of the home health aide is a big support to him.  CSW and client spoke about financial needs of client. Client said that monthly finances for client were challenging. He said that when his wife was living their would be two incomes for the home. Now, since she died several months ago, [place] said that he has only one income and it is sometimes difficult financially for him. [place] and CSW also spoke about the loss of his wife and his coping with grief issues faced. He said he is trying to cope and it is sometimes difficult. CSW asked client about his appointments with Dr. Moshe Cipro, primary doctor for client. Client said he has a scheduled appointment to see Dr. Moshe Cipro in June of 2016.  CSW thanked client for phone conversation.  CSW and client agreed for CSW to call client in next two weeks to again assess needs of client at that time.  Plan: Client to take medications as prescribed and to attend scheduled  medical appointments. Client to communicate with Dr. Moshe Cipro related to medical needs of client. Client to call CSW at [phone] to address clinical social work needs of client. CSW to assess if client is attending scheduled medical appointments.  Norva Riffle.Kenyah Luba MSW, LCSW Licensed Clinical Social Worker [facility] Care Management [phone]

## 2015-03-04 NOTE — Patient Instructions (Signed)
Client to contact primary doctor to discuss medical needs of client.  Client to take medications as prescribed and to attend scheduled medical appointments. Client to call CSW at [phone] as needed to address clinical social work needs of client.

## 2015-03-07 ENCOUNTER — Other Ambulatory Visit: Payer: Self-pay

## 2015-03-07 MED ORDER — DONEPEZIL HCL 10 MG PO TABS: ORAL_TABLET | ORAL | Status: DC

## 2015-03-07 MED ORDER — OMEPRAZOLE 40 MG PO CPDR: 40.0000 mg | DELAYED_RELEASE_CAPSULE | Freq: Every day | ORAL | Status: DC

## 2015-03-07 MED ORDER — BENAZEPRIL HCL 5 MG PO TABS: ORAL_TABLET | ORAL | Status: DC

## 2015-03-07 MED ORDER — TRAMADOL HCL 50 MG PO TABS: 50.0000 mg | ORAL_TABLET | Freq: Two times a day (BID) | ORAL | Status: DC

## 2015-03-07 MED ORDER — GLIPIZIDE ER 10 MG PO TB24: ORAL_TABLET | ORAL | Status: DC

## 2015-03-07 MED ORDER — LOVASTATIN 40 MG PO TABS: 40.0000 mg | ORAL_TABLET | Freq: Every day | ORAL | Status: DC

## 2015-03-07 MED ORDER — SERTRALINE HCL 50 MG PO TABS: ORAL_TABLET | ORAL | Status: DC

## 2015-03-17 ENCOUNTER — Telehealth: Payer: Self-pay | Admitting: Family Medicine

## 2015-03-18 MED ORDER — TAMSULOSIN HCL 0.4 MG PO CAPS: 0.4000 mg | ORAL_CAPSULE | Freq: Every day | ORAL | Status: DC

## 2015-03-18 MED ORDER — FERROUS SULFATE 325 (65 FE) MG PO TABS: 325.0000 mg | ORAL_TABLET | Freq: Two times a day (BID) | ORAL | Status: DC

## 2015-03-18 MED ORDER — VITAMIN D (ERGOCALCIFEROL) 1.25 MG (50000 UNIT) PO CAPS: 50000.0000 [IU] | ORAL_CAPSULE | ORAL | Status: DC

## 2015-03-18 MED ORDER — CLOBETASOL PROPIONATE 0.05 % EX CREA: 1.0000 "application " | TOPICAL_CREAM | Freq: Two times a day (BID) | CUTANEOUS | Status: DC

## 2015-03-18 NOTE — Telephone Encounter (Signed)
meds sent

## 2015-03-20 ENCOUNTER — Telehealth: Payer: Self-pay | Admitting: Family Medicine

## 2015-03-20 NOTE — Telephone Encounter (Signed)
Son Brain wants him to be seen i gave her an appointment for next week for the patient

## 2015-03-21 NOTE — Telephone Encounter (Signed)
appt Monday at 3:45

## 2015-03-24 ENCOUNTER — Ambulatory Visit (INDEPENDENT_AMBULATORY_CARE_PROVIDER_SITE_OTHER): Payer: Commercial Managed Care - HMO | Admitting: Family Medicine

## 2015-03-24 ENCOUNTER — Encounter: Payer: Self-pay | Admitting: Family Medicine

## 2015-03-24 VITALS — BP 120/82 | HR 80 | Resp 18 | Ht 70.0 in | Wt 247.1 lb

## 2015-03-24 DIAGNOSIS — F0393 Unspecified dementia, unspecified severity, with mood disturbance: Secondary | ICD-10-CM

## 2015-03-24 DIAGNOSIS — F0280 Dementia in other diseases classified elsewhere without behavioral disturbance: Secondary | ICD-10-CM

## 2015-03-24 DIAGNOSIS — G309 Alzheimer's disease, unspecified: Secondary | ICD-10-CM

## 2015-03-24 DIAGNOSIS — E785 Hyperlipidemia, unspecified: Secondary | ICD-10-CM

## 2015-03-24 DIAGNOSIS — G308 Other Alzheimer's disease: Secondary | ICD-10-CM

## 2015-03-24 DIAGNOSIS — E1165 Type 2 diabetes mellitus with hyperglycemia: Secondary | ICD-10-CM

## 2015-03-24 DIAGNOSIS — J3089 Other allergic rhinitis: Secondary | ICD-10-CM

## 2015-03-24 DIAGNOSIS — M159 Polyosteoarthritis, unspecified: Secondary | ICD-10-CM

## 2015-03-24 DIAGNOSIS — Z9181 History of falling: Secondary | ICD-10-CM

## 2015-03-24 DIAGNOSIS — E559 Vitamin D deficiency, unspecified: Secondary | ICD-10-CM

## 2015-03-24 DIAGNOSIS — F329 Major depressive disorder, single episode, unspecified: Secondary | ICD-10-CM

## 2015-03-24 DIAGNOSIS — F409 Phobic anxiety disorder, unspecified: Secondary | ICD-10-CM

## 2015-03-24 DIAGNOSIS — F5105 Insomnia due to other mental disorder: Secondary | ICD-10-CM | POA: Diagnosis not present

## 2015-03-24 DIAGNOSIS — E1129 Type 2 diabetes mellitus with other diabetic kidney complication: Secondary | ICD-10-CM | POA: Diagnosis not present

## 2015-03-24 DIAGNOSIS — IMO0002 Reserved for concepts with insufficient information to code with codable children: Secondary | ICD-10-CM

## 2015-03-24 DIAGNOSIS — R296 Repeated falls: Secondary | ICD-10-CM

## 2015-03-24 DIAGNOSIS — F0281 Dementia in other diseases classified elsewhere with behavioral disturbance: Secondary | ICD-10-CM

## 2015-03-24 NOTE — Patient Instructions (Addendum)
F/U in 3 month, call if you need me before   Fasting lipid, cmp and EGFr and HBa1C this week please  ENSURE YOU [place]  Start calling a few people who you long to speak to, and hopefully  And prayerfully you will start feeling better about some of the relationships that bother you  Script for lift chair and toilet extender will be sent in you need both  Thanks for choosing [facility], we consider it a privelige to serve you.

## 2015-03-24 NOTE — Progress Notes (Signed)
Subjective:    Patient ID: Samuel Ryan, male    DOB: [birth year], 81 y.o.   MRN: [id number]  HPI The PT is here for follow up and re-evaluation of chronic medical conditions, medication management and review of any available recent lab and radiology data.  Preventive health is updated, specifically   Immunization.   . The PT denies any adverse reactions to current medications since the last visit. His caregiver however , has sent a note that he is at times very drowsy in the mornings, pt vehemently denies this, remron dose needs to be carefully assessed Pt c/o feeling neglected and voices suspicion as to the trust worthiness of people he knows Requests lift chair and toilet extender for increased independence and safety in the home, as he has severe generalized arthritis and generalized weakness due to lack of sufficient mobility Denies polyuria, polydipsia, blurred vision , or hypoglycemic episodes. Diet is uncontrolled as he relies on family to bring him both lunch and supper., his sitter brings in breakfast which she buys for him, he has no interest in her preparing his food. His son who is present will check on this some more    Review of Systems See HPI Denies recent fever or chills. Denies sinus pressure, nasal congestion, ear pain or sore throat. Denies chest congestion, productive cough or wheezing. Denies chest pains, palpitations and leg swelling Denies abdominal pain, nausea, vomiting,diarrhea or constipation.   Denies dysuria, frequency, hesitancy or incontinence. C/o joint pain, swelling and limitation in mobility.c/o generalized weakness, difficulty raising him from a regular chair and requests a lift chair also toilet extender Denies headaches, seizures, numbness, or tingling. C/o mild   States people dont " call or come around as they used to" depression anxiety or insomnia. Denies skin break down or rash.        Objective:   Physical Exam BP 120/82 mmHg  Pulse  80  Resp 18  Ht 5\' 10"  (1.778 m)  Wt 247 lb 1.9 oz (112.093 kg)  BMI 35.46 kg/m2  SpO2 92% Patient alert  and in no cardiopulmonary distress.  HEENT: No facial asymmetry, EOMI,   oropharynx pink and moist.  Neck decreased ROM no JVD, no mass.No sinus tenderness  Chest: Clear to auscultation bilaterally.Decreased though adequate air entry  CVS: S1, S2 no murmurs, no S3.Regular rate.  ABD: Soft non tender.   Ext: No edema  MS: Markedly decreased  ROM spine, shoulders, hips and knees.  Skin: Intact, no ulcerations or rash noted.  Psych: Good eye contact, normal affect. Memory impaired not anxious or depressed appearing.  CNS: CN 2-12 intact, grade 4 power,   Throughout.Redfuced tone and muscle wasting noted ,no focal deficits noted.        Assessment & Plan:  Type 2 diabetes, uncontrolled, with renal manifestation  Deteriorated, closer attention to diet and med dose change made. Patient educated about the importance of limiting  Carbohydrate intake , the need to commit to daily physical activity for a minimum of 30 minutes , and to commit weight loss. The fact that changes in all these areas will reduce or eliminate all together the development of diabetes is stressed.   Diabetic Labs Latest Ref Rng 03/28/2015 02/13/2015 12/24/2014 10/13/2014 09/05/2014  HbA1c <5.7 % 8.7(H) - 8.0(H) - 8.9(H)  Microalbumin 0.00 - 1.89 mg/dL - - - - -  Micro/Creat Ratio 0.0 - 30.0 mg/g - - - - -  Chol 0 - 200 mg/dL 158 - - -  133  HDL >=40 mg/dL 48 - - - 38(L)  Calc LDL 0 - 99 mg/dL 91 - - - 74  Triglycerides <150 mg/dL 97 - - - 107  Creatinine 0.50 - 1.35 mg/dL 1.43(H) 1.66(H) 1.47(H) 1.53(H) 1.39(H)   BP/Weight 03/24/2015 02/13/2015 12/30/2014 10/31/2014 10/13/2014 09/09/2014 01/10/9416  Systolic BP 408 144 818 563 149 702 637  Diastolic BP 82 73 60 80 73 80 72  Wt. (Lbs) 247.12 264 250.04 246 - 246 244  BMI 35.46 35.8 35.88 35.3 - 35.3 35.01   Foot/eye exam completion dates Latest Ref Rng  09/23/2014 07/16/2014  Eye Exam No Retinopathy No Retinopathy -  Foot Form Completion - - Done        Depression due to dementia Pt somewhat depressed, accusing friends and family of neglecting him, not as attentive as before I have encouraged him to reach out more by making calls on his own   Hyperlipidemia with target LDL less than 100 Hyperlipidemia:Low fat diet discussed and encouraged. Controlled, no change in medication    Lipid Panel  Lab Results  Component Value Date   CHOL 158 03/28/2015   HDL 48 03/28/2015   LDLCALC 91 03/28/2015   TRIG 97 03/28/2015   CHOLHDL 3.3 03/28/2015         Insomnia due to anxiety and fear Lilkely over medicated, recent report of excess sedation in the am, need to absolutely clarify the dose of remeron he is on, should be 7.5 mg and not 15mg . Will  Obtain most recent med list printed out to clarify this   Dementia of Alzheimer's type with behavioral disturbance Son who brings him tofday denies any uncontrolled behavior in terms of physical aggression. He does however note is father's increased suspicion as voiced at visit   Allergic rhinitis Controlled, no change in medication    Generalized osteoarthritis Generalized osteoarthritis involving spine, hips, shoulders and knees, with muscle weakness due to de conditioning. Relies on assistive device for safe mobility. Difficulty raising up out of standard chair, will benefit from lift chair to assist in safely raising himself up from a sitting position. This will increase his independence and safety in the home  Toilet extender required for ease of getting off of commode due to generalized weakness and severe osteoarthritis, this will improve home safet also, by reducing fall risk in the bathroom.  Scripts for both  Will be provided   Vitamin D deficiency Supplemental vit D needed , and is to be vcontinued

## 2015-03-28 DIAGNOSIS — E785 Hyperlipidemia, unspecified: Secondary | ICD-10-CM | POA: Diagnosis not present

## 2015-03-28 DIAGNOSIS — E1165 Type 2 diabetes mellitus with hyperglycemia: Secondary | ICD-10-CM | POA: Diagnosis not present

## 2015-03-28 DIAGNOSIS — E1129 Type 2 diabetes mellitus with other diabetic kidney complication: Secondary | ICD-10-CM | POA: Diagnosis not present

## 2015-03-28 LAB — COMPLETE METABOLIC PANEL WITH GFR
ALBUMIN: 3.7 g/dL (ref 3.5–5.2)
ALT: <8 U/L (ref 0–53)
AST: 14 U/L (ref 0–37)
Alkaline Phosphatase: 63 U/L (ref 39–117)
BUN: 10 mg/dL (ref 6–23)
CALCIUM: 9 mg/dL (ref 8.4–10.5)
CO2: 23 meq/L (ref 19–32)
Chloride: 105 mEq/L (ref 96–112)
Creat: 1.43 mg/dL — ABNORMAL HIGH (ref 0.50–1.35)
GFR, Est African American: 52 mL/min — ABNORMAL LOW
GFR, Est Non African American: 45 mL/min — ABNORMAL LOW
Glucose, Bld: 142 mg/dL — ABNORMAL HIGH (ref 70–99)
POTASSIUM: 4.4 meq/L (ref 3.5–5.3)
Sodium: 138 mEq/L (ref 135–145)
TOTAL PROTEIN: 7.1 g/dL (ref 6.0–8.3)
Total Bilirubin: 1.1 mg/dL (ref 0.2–1.2)

## 2015-03-28 LAB — LIPID PANEL
CHOL/HDL RATIO: 3.3 ratio
Cholesterol: 158 mg/dL (ref 0–200)
HDL: 48 mg/dL (ref >=40)
LDL Cholesterol: 91 mg/dL (ref 0–99)
Triglycerides: 97 mg/dL (ref <150)
VLDL: 19 mg/dL (ref 0–40)

## 2015-03-28 LAB — HEMOGLOBIN A1C
HEMOGLOBIN A1C: 8.7 % — AB (ref <5.7)
Mean Plasma Glucose: 203 mg/dL — ABNORMAL HIGH (ref <117)

## 2015-03-30 DIAGNOSIS — Z9181 History of falling: Secondary | ICD-10-CM | POA: Insufficient documentation

## 2015-03-30 NOTE — Assessment & Plan Note (Signed)
Son who brings him tofday denies any uncontrolled behavior in terms of physical aggression. He does however note is father's increased suspicion as voiced at visit

## 2015-03-30 NOTE — Assessment & Plan Note (Signed)
Generalized osteoarthritis involving spine, hips, shoulders and knees, with muscle weakness due to de conditioning. Relies on assistive device for safe mobility. Difficulty raising up out of standard chair, will benefit from lift chair to assist in safely raising himself up from a sitting position. This will increase his independence and safety in the home  Toilet extender required for ease of getting off of commode due to generalized weakness and severe osteoarthritis, this will improve home safet also, by reducing fall risk in the bathroom.  Scripts for both  Will be provided

## 2015-03-30 NOTE — Assessment & Plan Note (Signed)
Pt somewhat depressed, accusing friends and family of neglecting him, not as attentive as before I have encouraged him to reach out more by making calls on his own

## 2015-03-30 NOTE — Assessment & Plan Note (Signed)
Supplemental vit D needed , and is to be vcontinued

## 2015-03-30 NOTE — Assessment & Plan Note (Addendum)
Lilkely over medicated, recent report of excess sedation in the am, need to absolutely clarify the dose of remeron he is on, should be 7.5 mg and not 15mg . Will  Obtain most recent med list printed out to clarify this

## 2015-03-30 NOTE — Assessment & Plan Note (Signed)
Controlled, no change in medication  

## 2015-03-30 NOTE — Assessment & Plan Note (Signed)
Hyperlipidemia:Low fat diet discussed and encouraged. Controlled, no change in medication    Lipid Panel  Lab Results  Component Value Date   CHOL 158 03/28/2015   HDL 48 03/28/2015   LDLCALC 91 03/28/2015   TRIG 97 03/28/2015   CHOLHDL 3.3 03/28/2015

## 2015-03-30 NOTE — Assessment & Plan Note (Addendum)
  Deteriorated, closer attention to diet and med dose change made. Patient educated about the importance of limiting  Carbohydrate intake , the need to commit to daily physical activity for a minimum of 30 minutes , and to commit weight loss. The fact that changes in all these areas will reduce or eliminate all together the development of diabetes is stressed.   Diabetic Labs Latest Ref Rng 03/28/2015 02/13/2015 12/24/2014 10/13/2014 09/05/2014  HbA1c <5.7 % 8.7(H) - 8.0(H) - 8.9(H)  Microalbumin 0.00 - 1.89 mg/dL - - - - -  Micro/Creat Ratio 0.0 - 30.0 mg/g - - - - -  Chol 0 - 200 mg/dL 158 - - - 133  HDL >=40 mg/dL 48 - - - 38(L)  Calc LDL 0 - 99 mg/dL 91 - - - 74  Triglycerides <150 mg/dL 97 - - - 107  Creatinine 0.50 - 1.35 mg/dL 1.43(H) 1.66(H) 1.47(H) 1.53(H) 1.39(H)   BP/Weight 03/24/2015 02/13/2015 12/30/2014 10/31/2014 10/13/2014 09/09/2014 0/86/7619  Systolic BP 509 326 712 458 099 833 825  Diastolic BP 82 73 60 80 73 80 72  Wt. (Lbs) 247.12 264 250.04 246 - 246 244  BMI 35.46 35.8 35.88 35.3 - 35.3 35.01   Foot/eye exam completion dates Latest Ref Rng 09/23/2014 07/16/2014  Eye Exam No Retinopathy No Retinopathy -  Foot Form Completion - - Done

## 2015-03-31 ENCOUNTER — Other Ambulatory Visit: Payer: Self-pay

## 2015-03-31 ENCOUNTER — Telehealth: Payer: Self-pay | Admitting: Family Medicine

## 2015-03-31 NOTE — Telephone Encounter (Addendum)
I spoke with his son, his concern was if there was anything in pt record stating that he was mentally incapable/ incompetent as far as his ability to make decision. I advised not, though he does have a dx of dementia, and that he would need neuropsych eval to state this, as Samuel Ryan is oriented x 3 , and is able to answer questions appropriately. This was in regard to it seems property , as he stated that his lawyer advidsed him to call in

## 2015-04-01 ENCOUNTER — Other Ambulatory Visit: Payer: Self-pay | Admitting: Family Medicine

## 2015-04-22 ENCOUNTER — Other Ambulatory Visit: Payer: Self-pay | Admitting: Licensed Clinical Social Worker

## 2015-04-22 NOTE — Patient Outreach (Signed)
[facility]) Care Management  [facility] Social Work  04/22/2015  WOODFORD STREGE 1931/01/08 [id number]  Subjective:    Objective:   Current Medications:  Current Outpatient Prescriptions  Medication Sig Dispense Refill  . acetaminophen (TYLENOL) 500 MG tablet Take 1,000 mg by mouth every 6 (six) hours as needed for moderate pain.    . benazepril (LOTENSIN) 5 MG tablet TAKE ONE (1) TABLET EACH DAY 90 tablet 1  . clobetasol cream (TEMOVATE) 8.31 % Apply 1 application topically 2 (two) times daily. 30 g 3  . donepezil (ARICEPT) 10 MG tablet TAKE ONE (1) TABLET AT BEDTIME 90 tablet 1  . ferrous sulfate 325 (65 FE) MG tablet Take 1 tablet (325 mg total) by mouth 2 (two) times daily. 60 tablet 3  . fluticasone (FLONASE) 50 MCG/ACT nasal spray Place 2 sprays into both nostrils daily. 16 g 6  . glipiZIDE (GLUCOTROL XL) 10 MG 24 hr tablet Two tablets every morning at breakfast 180 tablet 1  . linagliptin (TRADJENTA) 5 MG TABS tablet Take 1 tablet (5 mg total) by mouth daily. 30 tablet 5  . lovastatin (MEVACOR) 40 MG tablet Take 1 tablet (40 mg total) by mouth at bedtime. 90 tablet 1  . mirtazapine (REMERON) 7.5 MG tablet Take 1 tablet (7.5 mg total) by mouth at bedtime. 30 tablet 3  . omeprazole (PRILOSEC) 40 MG capsule Take 1 capsule (40 mg total) by mouth daily. 90 capsule 1  . Polyethylene Glycol 400 (BLINK TEARS OP) Apply 1 drop to eye daily as needed (dry eyes).    . sertraline (ZOLOFT) 50 MG tablet TAKE ONE (1) TABLET EACH DAY 90 tablet 1  . tamsulosin (FLOMAX) 0.4 MG CAPS capsule Take 1 capsule (0.4 mg total) by mouth daily. 90 capsule 1  . traMADol (ULTRAM) 50 MG tablet Take 1 tablet (50 mg total) by mouth 2 (two) times daily. 180 tablet 0  . Vitamin D, Ergocalciferol, (DRISDOL) 50000 UNITS CAPS capsule Take 1 capsule (50,000 Units total) by mouth every 7 (seven) days. Takes on Fridays. 12 capsule 1   No current facility-administered medications for this visit.     Functional Status:  In your present state of health, do you have any difficulty performing the following activities: 04/22/2015 05/09/2014  Hearing? N N  Vision? N N  Difficulty concentrating or making decisions? Tempie Donning  Walking or climbing stairs? Y Y  Dressing or bathing? Y Y  Doing errands, shopping? Tempie Donning  Preparing Food and eating ? Y -  Using the Toilet? N -  In the past six months, have you accidently leaked urine? N -  Do you have problems with loss of bowel control? N -  Managing your Medications? Y -  Managing your Finances? Y -  Housekeeping or managing your Housekeeping? Y -    Fall/Depression Screening:  PHQ 2/9 Scores 04/22/2015 03/24/2015 12/30/2014 06/19/2013  PHQ - 2 Score 2 0 - 0  PHQ- 9 Score 7 3 - -  Exception Documentation - - Other- indicate reason in comment box -  Not completed - - wife recently passed  -    Assessment:  Previous documentation in Omar record.  CSW met with client on 04/22/15 at home of client for a routine home visit. Client said his son continues to check in on client regularly.  Client's brother assists client occasionally  Friends from church call client  occasionally. Doristine Bosworth of church has visited client  several times since death of client's spouse.  Other relatives visit client occasionally. Client said two cousins visit him often. Client said that his home health aide assist him Mondays- Fridays for three hours each of these days weekly. Client said home health aide is very helpful to client. Client said home health aide brings him breakfast on most mornings she assists him.  Client said he has had  help from home health aide for several years.  Client said Dr. Moshe Cipro has sent order for client for a lift chair.  Client is also seeking a new 3 in 1 bedside commode for his use.  Client said he had talked with Dr. Moshe Cipro recently about grief, depression issues.  Client said he does not see a counselor at present. CSW talked with Eddie Dibbles about counseling  support and options for client. For example, client could speak with Dr. Moshe Cipro about counseling support locally; or, client could be referred to [facility]  for telephonic counseling support. Client said he wants to think about these counseling options for a while and client did not want to decide about counseling support at present but wants to speak further with Dr. Moshe Cipro about counseling support.  Client said he watches television for relaxation.  Client said sometimes family members take him out to eat locally.  He said he used his walker to ambulate.  He said he has his prescribed medications.  Client said he gets monthly social security check for income.  However, he said he still faced some financial challenges. CSW talked with client about Medicaid application process.  Since client has some challenges gathering financial data, CSW encouraged client and son to work together to gather financial  information for client to be able to apply again for Medicaid.  CSW encouraged client and son to gather this fnancial data on client and go to [employer] [facility] in [place], [place] to apply for Medicaid for client.  Client said he has gone to [place] one time since the death of his wife. Client said he saw Dr. Moshe Cipro one month ago. CSW encouraged client to call CSW at [phone] as needed to address clinical social work issues faced by client. Client was very appreciative of home visit by CSW on 04/22/15.  Plan:   Client to talk with Dr. Moshe Cipro about grief, depression issues for client and options for counseling support for client related to managing grief, depression. Client to take medications as prescribed and to attend scheduled medical appointments. Client to cooperate with in home aide assisting client  as scheduled each week in the home of client. CSW to call client in three weeks to assess needs of client.  Norva Riffle.Shron Ozer MSW, LCSW Licensed  Clinical Social Worker [facility] Care Management [phone]

## 2015-04-30 ENCOUNTER — Ambulatory Visit: Payer: Commercial Managed Care - HMO | Admitting: Family Medicine

## 2015-05-23 ENCOUNTER — Other Ambulatory Visit: Payer: Self-pay | Admitting: Family Medicine

## 2015-05-26 ENCOUNTER — Other Ambulatory Visit: Payer: Self-pay

## 2015-05-26 MED ORDER — TRAMADOL HCL 50 MG PO TABS: 50.0000 mg | ORAL_TABLET | Freq: Two times a day (BID) | ORAL | Status: DC

## 2015-06-02 ENCOUNTER — Encounter (HOSPITAL_COMMUNITY): Payer: Self-pay | Admitting: Emergency Medicine

## 2015-06-02 ENCOUNTER — Emergency Department (HOSPITAL_COMMUNITY): Payer: Commercial Managed Care - HMO

## 2015-06-02 ENCOUNTER — Emergency Department (HOSPITAL_COMMUNITY)
Admission: EM | Admit: 2015-06-02 | Discharge: 2015-06-02 | Disposition: A | Discharge status: Home / Self Care | Payer: Commercial Managed Care - HMO | Attending: Emergency Medicine | Admitting: Emergency Medicine

## 2015-06-02 DIAGNOSIS — K5730 Diverticulosis of large intestine without perforation or abscess without bleeding: Secondary | ICD-10-CM | POA: Diagnosis not present

## 2015-06-02 DIAGNOSIS — E782 Mixed hyperlipidemia: Secondary | ICD-10-CM | POA: Insufficient documentation

## 2015-06-02 DIAGNOSIS — K828 Other specified diseases of gallbladder: Secondary | ICD-10-CM | POA: Diagnosis not present

## 2015-06-02 DIAGNOSIS — E119 Type 2 diabetes mellitus without complications: Secondary | ICD-10-CM | POA: Insufficient documentation

## 2015-06-02 DIAGNOSIS — Z7951 Long term (current) use of inhaled steroids: Secondary | ICD-10-CM | POA: Insufficient documentation

## 2015-06-02 DIAGNOSIS — Z79899 Other long term (current) drug therapy: Secondary | ICD-10-CM | POA: Insufficient documentation

## 2015-06-02 DIAGNOSIS — K5792 Diverticulitis of intestine, part unspecified, without perforation or abscess without bleeding: Secondary | ICD-10-CM | POA: Diagnosis not present

## 2015-06-02 DIAGNOSIS — I517 Cardiomegaly: Secondary | ICD-10-CM | POA: Diagnosis not present

## 2015-06-02 DIAGNOSIS — M1990 Unspecified osteoarthritis, unspecified site: Secondary | ICD-10-CM | POA: Diagnosis not present

## 2015-06-02 DIAGNOSIS — Z86711 Personal history of pulmonary embolism: Secondary | ICD-10-CM | POA: Diagnosis not present

## 2015-06-02 DIAGNOSIS — I10 Essential (primary) hypertension: Secondary | ICD-10-CM | POA: Insufficient documentation

## 2015-06-02 DIAGNOSIS — Z86718 Personal history of other venous thrombosis and embolism: Secondary | ICD-10-CM | POA: Insufficient documentation

## 2015-06-02 DIAGNOSIS — R109 Unspecified abdominal pain: Secondary | ICD-10-CM

## 2015-06-02 DIAGNOSIS — Z8701 Personal history of pneumonia (recurrent): Secondary | ICD-10-CM | POA: Insufficient documentation

## 2015-06-02 DIAGNOSIS — N400 Enlarged prostate without lower urinary tract symptoms: Secondary | ICD-10-CM | POA: Diagnosis not present

## 2015-06-02 DIAGNOSIS — R1011 Right upper quadrant pain: Secondary | ICD-10-CM | POA: Diagnosis not present

## 2015-06-02 DIAGNOSIS — R1031 Right lower quadrant pain: Secondary | ICD-10-CM | POA: Diagnosis not present

## 2015-06-02 DIAGNOSIS — F0390 Unspecified dementia without behavioral disturbance: Secondary | ICD-10-CM | POA: Insufficient documentation

## 2015-06-02 DIAGNOSIS — J8410 Pulmonary fibrosis, unspecified: Secondary | ICD-10-CM | POA: Diagnosis not present

## 2015-06-02 DIAGNOSIS — J984 Other disorders of lung: Secondary | ICD-10-CM | POA: Diagnosis not present

## 2015-06-02 LAB — CBC WITH DIFFERENTIAL/PLATELET
BASOS ABS: 0 10*3/uL (ref 0.0–0.1)
BASOS PCT: 0 % (ref 0–1)
EOS ABS: 0.1 10*3/uL (ref 0.0–0.7)
EOS PCT: 3 % (ref 0–5)
HEMATOCRIT: 32.8 % — AB (ref 39.0–52.0)
Hemoglobin: 10.8 g/dL — ABNORMAL LOW (ref 13.0–17.0)
LYMPHS ABS: 1.5 10*3/uL (ref 0.7–4.0)
Lymphocytes Relative: 33 % (ref 12–46)
MCH: 29.9 pg (ref 26.0–34.0)
MCHC: 32.9 g/dL (ref 30.0–36.0)
MCV: 90.9 fL (ref 78.0–100.0)
MONO ABS: 0.5 10*3/uL (ref 0.1–1.0)
MONOS PCT: 10 % (ref 3–12)
Neutro Abs: 2.5 10*3/uL (ref 1.7–7.7)
Neutrophils Relative %: 54 % (ref 43–77)
Platelets: 156 10*3/uL (ref 150–400)
RBC: 3.61 MIL/uL — ABNORMAL LOW (ref 4.22–5.81)
RDW: 13.5 % (ref 11.5–15.5)
WBC: 4.6 10*3/uL (ref 4.0–10.5)

## 2015-06-02 LAB — URINALYSIS, ROUTINE W REFLEX MICROSCOPIC
Bilirubin Urine: NEGATIVE
GLUCOSE, UA: NEGATIVE mg/dL
KETONES UR: NEGATIVE mg/dL
LEUKOCYTES UA: NEGATIVE
Nitrite: NEGATIVE
Protein, ur: NEGATIVE mg/dL
Specific Gravity, Urine: 1.015 (ref 1.005–1.030)
UROBILINOGEN UA: 0.2 mg/dL (ref 0.0–1.0)
pH: 6 (ref 5.0–8.0)

## 2015-06-02 LAB — COMPREHENSIVE METABOLIC PANEL
ALBUMIN: 3.2 g/dL — AB (ref 3.5–5.0)
ALT: 9 U/L — ABNORMAL LOW (ref 17–63)
AST: 13 U/L — ABNORMAL LOW (ref 15–41)
Alkaline Phosphatase: 59 U/L (ref 38–126)
Anion gap: 5 (ref 5–15)
BUN: 14 mg/dL (ref 6–20)
CO2: 25 mmol/L (ref 22–32)
Calcium: 8.3 mg/dL — ABNORMAL LOW (ref 8.9–10.3)
Chloride: 106 mmol/L (ref 101–111)
Creatinine, Ser: 1.4 mg/dL — ABNORMAL HIGH (ref 0.61–1.24)
GFR calc Af Amer: 52 mL/min — ABNORMAL LOW (ref >60)
GFR, EST NON AFRICAN AMERICAN: 45 mL/min — AB (ref >60)
GLUCOSE: 119 mg/dL — AB (ref 65–99)
Potassium: 4.3 mmol/L (ref 3.5–5.1)
Sodium: 136 mmol/L (ref 135–145)
Total Bilirubin: 0.8 mg/dL (ref 0.3–1.2)
Total Protein: 6.8 g/dL (ref 6.5–8.1)

## 2015-06-02 LAB — URINE MICROSCOPIC-ADD ON

## 2015-06-02 LAB — LIPASE, BLOOD: Lipase: 27 U/L (ref 22–51)

## 2015-06-02 MED ORDER — METRONIDAZOLE 500 MG PO TABS: 500.0000 mg | ORAL_TABLET | Freq: Three times a day (TID) | ORAL | Status: DC

## 2015-06-02 MED ORDER — CIPROFLOXACIN HCL 500 MG PO TABS: 500.0000 mg | ORAL_TABLET | Freq: Two times a day (BID) | ORAL | Status: DC

## 2015-06-02 MED ORDER — HYDROCODONE-ACETAMINOPHEN 5-325 MG PO TABS: 1.0000 | ORAL_TABLET | Freq: Four times a day (QID) | ORAL | Status: DC | PRN

## 2015-06-02 NOTE — ED Notes (Signed)
Patient with c/o RLQ abdominal pain intermittent x  2 weeks. States he fell two weeks ago, unsure if he hit his right side or not. Patient also c/o right wrist and hand pain. Wrist is swollen and red.

## 2015-06-02 NOTE — Discharge Instructions (Signed)
Cipro and Flagyl as prescribed.  Hydrocodone as prescribed as needed for pain.  Return to the emergency department if you develop severe pain, bloody stool, high fever, vomiting, or other new and concerning symptoms.   Diverticulitis Diverticulitis is inflammation or infection of small pouches in your colon that form when you have a condition called diverticulosis. The pouches in your colon are called diverticula. Your colon, or large intestine, is where water is absorbed and stool is formed. Complications of diverticulitis can include:  Bleeding.  Severe infection.  Severe pain.  Perforation of your colon.  Obstruction of your colon. CAUSES  Diverticulitis is caused by bacteria. Diverticulitis happens when stool becomes trapped in diverticula. This allows bacteria to grow in the diverticula, which can lead to inflammation and infection. RISK FACTORS People with diverticulosis are at risk for diverticulitis. Eating a diet that does not include enough fiber from fruits and vegetables may make diverticulitis more likely to develop. SYMPTOMS  Symptoms of diverticulitis may include:  Abdominal pain and tenderness. The pain is normally located on the left side of the abdomen, but may occur in other areas.  Fever and chills.  Bloating.  Cramping.  Nausea.  Vomiting.  Constipation.  Diarrhea.  Blood in your stool. DIAGNOSIS  Your health care provider will ask you about your medical history and do a physical exam. You may need to have tests done because many medical conditions can cause the same symptoms as diverticulitis. Tests may include:  Blood tests.  Urine tests.  Imaging tests of the abdomen, including X-rays and CT scans. When your condition is under control, your health care provider may recommend that you have a colonoscopy. A colonoscopy can show how severe your diverticula are and whether something else is causing your symptoms. TREATMENT  Most cases of  diverticulitis are mild and can be treated at home. Treatment may include:  Taking over-the-counter pain medicines.  Following a clear liquid diet.  Taking antibiotic medicines by mouth for 7-10 days. More severe cases may be treated at a hospital. Treatment may include:  Not eating or drinking.  Taking prescription pain medicine.  Receiving antibiotic medicines through an IV tube.  Receiving fluids and nutrition through an IV tube.  Surgery. HOME CARE INSTRUCTIONS   Follow your health care provider's instructions carefully.  Follow a full liquid diet or other diet as directed by your health care provider. After your symptoms improve, your health care provider may tell you to change your diet. He or she may recommend you eat a high-fiber diet. Fruits and vegetables are good sources of fiber. Fiber makes it easier to pass stool.  Take fiber supplements or probiotics as directed by your health care provider.  Only take medicines as directed by your health care provider.  Keep all your follow-up appointments. SEEK MEDICAL CARE IF:   Your pain does not improve.  You have a hard time eating food.  Your bowel movements do not return to normal. SEEK IMMEDIATE MEDICAL CARE IF:   Your pain becomes worse.  Your symptoms do not get better.  Your symptoms suddenly get worse.  You have a fever.  You have repeated vomiting.  You have bloody or black, tarry stools. MAKE SURE YOU:   Understand these instructions.  Will watch your condition.  Will get help right away if you are not doing well or get worse. Document Released: 08/25/2005 Document Revised: 11/20/2013 Document Reviewed: 10/10/2013 [facility] Patient Information 2015 [place], [place]. This information is not intended to  replace advice given to you by your health care provider. Make sure you discuss any questions you have with your health care provider. ° °

## 2015-06-02 NOTE — ED Provider Notes (Signed)
CSN: [id number]     Arrival date & time 06/02/15  1219 History   First MD Initiated Contact with Patient 06/02/15 1242     Chief Complaint  Patient presents with  . Abdominal Pain     (Consider location/radiation/quality/duration/timing/severity/associated sxs/prior Treatment) HPI Comments: Patient is an 81 year old male with past medical history of type 2 diabetes, pulmonary embolism, dementia. He presents for evaluation of right-sided abdominal pain that started 1 week ago. His discomfort has been intermittent but worsening. He denies any bowel or bladder complaints. He denies any fevers or chills.  Patient is a 81 y.o. male presenting with abdominal pain. The history is provided by the patient.  Abdominal Pain Pain location:  RUQ and RLQ Pain quality: cramping   Pain radiates to:  Does not radiate Pain severity:  Moderate Onset quality:  Sudden Duration:  1 week Timing:  Intermittent Progression:  Worsening Chronicity:  New Relieved by:  Nothing Worsened by:  Nothing tried Ineffective treatments:  None tried Associated symptoms: cough   Associated symptoms: no anorexia, no diarrhea, no dysuria, no fatigue and no fever     Past Medical History  Diagnosis Date  . Type 2 diabetes mellitus   . Mixed hyperlipidemia   . Essential hypertension, benign   . Dementia   . BPH (benign prostatic hypertrophy)   . Pulmonary embolism     Diagnosed 7/12  . Deep venous thrombosis     Left leg, diagnosed 7/12  . Osteoarthritis   . Diverticulitis   . Acute cholecystitis 12/12/2012 to 12/19/2012    drainage tube placed, hosptalizred x 1 week, then nursing home  . Pneumonia    Past Surgical History  Procedure Laterality Date  . Neck fusion    . Carpal tunnel release    . Right total knee replacement  04/23/2011  . Colonoscopy  08/31/2012    Procedure: COLONOSCOPY;  Surgeon: Rogene Houston, MD;  [facility];  Service: Endoscopy;  Laterality: N/A;  830  . Eye surgery     bilateral cataract  . Hernia repair      umbilical  . Spine surgery      neck fusion   Family History  Problem Relation Age of Onset  . Cancer Mother     Stomach  . Diabetes Sister   . Hypertension Sister   . Diabetes Brother   . Cancer Brother     Gallbladder   History  Substance Use Topics  . Smoking status: Never Smoker   . Smokeless tobacco: Never Used  . Alcohol Use: No    Review of Systems  Constitutional: Negative for fever and fatigue.  Respiratory: Positive for cough.   Gastrointestinal: Positive for abdominal pain. Negative for diarrhea and anorexia.  Genitourinary: Negative for dysuria.  All other systems reviewed and are negative.     Allergies  Vancomycin and Zosyn  Home Medications   Prior to Admission medications   Medication Sig Start Date End Date Taking? Authorizing Provider  acetaminophen (TYLENOL) 500 MG tablet Take 1,000 mg by mouth every 6 (six) hours as needed for moderate pain.    Historical Provider, MD  benazepril (LOTENSIN) 5 MG tablet TAKE ONE (1) TABLET EACH DAY 03/07/15   [name], MD  clobetasol cream (TEMOVATE) 4.09 % Apply 1 application topically 2 (two) times daily. 03/18/15   [name], MD  donepezil (ARICEPT) 10 MG tablet TAKE ONE (1) TABLET AT BEDTIME 03/07/15   [name], MD  ferrous  sulfate 325 (65 FE) MG tablet TAKE 1 TABLET TWICE DAILY 05/23/15   [name], MD  fluticasone [facility]) 50 MCG/ACT nasal spray Place 2 sprays into both nostrils daily. 01/31/14   [name], MD  glipiZIDE (GLUCOTROL XL) 10 MG 24 hr tablet Two tablets every morning at breakfast 03/07/15   [name], MD  linagliptin (TRADJENTA) 5 MG TABS tablet Take 1 tablet (5 mg total) by mouth daily. 04/01/15   [name], MD  lovastatin (MEVACOR) 40 MG tablet Take 1 tablet (40 mg total) by mouth at bedtime. 03/07/15   [name], MD  mirtazapine (REMERON) 7.5 MG tablet Take 1 tablet (7.5 mg total) by mouth at  bedtime. 12/30/14   [name], MD  omeprazole (PRILOSEC) 40 MG capsule Take 1 capsule (40 mg total) by mouth daily. 03/07/15   [name], MD  Polyethylene Glycol 400 (BLINK TEARS OP) Apply 1 drop to eye daily as needed (dry eyes).    Historical Provider, MD  sertraline (ZOLOFT) 50 MG tablet TAKE ONE (1) TABLET EACH DAY 03/07/15   [name], MD  tamsulosin (FLOMAX) 0.4 MG CAPS capsule Take 1 capsule (0.4 mg total) by mouth daily. 03/18/15   [name], MD  traMADol (ULTRAM) 50 MG tablet Take 1 tablet (50 mg total) by mouth 2 (two) times daily. 05/26/15   [name], MD  Vitamin D, Ergocalciferol, (DRISDOL) 50000 UNITS CAPS capsule Take 1 capsule (50,000 Units total) by mouth every 7 (seven) days. Takes on Fridays. 03/18/15   [name], MD   BP 142/84 mmHg  Pulse 59  Temp(Src) 97.9 F (36.6 C) (Oral)  Resp 22  Ht 6' (1.829 m)  Wt 254 lb (115.214 kg)  BMI 34.44 kg/m2  SpO2 99% Physical Exam  Constitutional: He is oriented to person, place, and time. He appears well-developed and well-nourished. No distress.  HENT:  Head: Normocephalic and atraumatic.  Neck: Normal range of motion. Neck supple.  Cardiovascular: Normal rate, regular rhythm and normal heart sounds.   No murmur heard. Pulmonary/Chest: Effort normal and breath sounds normal. No respiratory distress. He has no wheezes.  Abdominal: Soft. Bowel sounds are normal. He exhibits no distension. There is tenderness. There is no rebound and no guarding.  There is tenderness to palpation in the right upper and right lower quadrant. There is no rebound and no guarding.  Musculoskeletal: Normal range of motion.  Neurological: He is alert and oriented to person, place, and time.  Skin: Skin is warm and dry. He is not diaphoretic.  Nursing note and vitals reviewed.   ED Course  Procedures (including critical care time) Labs Review Labs Reviewed  CBC WITH DIFFERENTIAL/PLATELET  COMPREHENSIVE  METABOLIC PANEL  LIPASE, BLOOD  URINALYSIS, ROUTINE W REFLEX MICROSCOPIC (NOT AT [facility])    Imaging Review No results found.   EKG Interpretation None      MDM   Final diagnoses:  Right lateral abdominal pain    CT scan indicates early diverticulitis at the hepatic flexure. This coincides with the area where his tenderness is located. There is nothing else on the CT scan that would explain his discomfort. He will be discharged with Cipro and Flagyl, pain medication, and when necessary return.    Veryl Speak, MD 06/02/15 1350

## 2015-06-09 ENCOUNTER — Other Ambulatory Visit: Payer: Self-pay | Admitting: Licensed Clinical Social Worker

## 2015-06-09 NOTE — Patient Outreach (Signed)
Assessment: CSW called practice of Dr. Tula Nakayama on 06/09/15. CSW spoke with Velna Hatchet, RN at practice, on 06/09/15 regarding current needs of client.  CSW informed Brandi on 06/09/15 that two equipment items ordered by Dr. Moshe Cipro for client several weeks ago had not arrived at home of client and client had not been contacted about these items ordered by Dr. Moshe Cipro.  Velna Hatchet said that she saw the orders placed by Dr. Moshe Cipro for equipment items for client.  Velna Hatchet said she would follow up on this issue related to equipment items ordered for client by Dr. Moshe Cipro.  CSW thanked [place] for her assistance related to equipment needs of client  Plan: Velna Hatchet to follow up on issue of equipment items ordered for client by Dr. Moshe Cipro. Brandi to contact Lenice Llamas with update information related to equipment items for client. CSW to call client in two weeks to assess needs of client at that time.  Norva Riffle.Malgorzata Albert MSW, LCSW Licensed Clinical Social Worker [facility] Care Management [phone]

## 2015-06-09 NOTE — Patient Outreach (Signed)
Assessment: CSW called home phone number of client on 06/09/15.  CSW verified identity of client. Deone said that he was eating well and that his family often brought him food to eat for some of his weekly meals.  He said he still has weekly support as scheduled through the help of an in home aide. He said in home aide sometimes will transport him to and from medical appointments. He has his prescribed medications and is taking medications as prescribed.  He said he still did not have a 3 in 1 bedside commode or lift chair as had been ordered by Dr. Moshe Cipro.  CSW informed client on 06/09/15 that CSW would call office of Dr. Moshe Cipro and inform RN of Dr. Moshe Cipro that client still did not have either of these equipment items previously ordered by Dr. Moshe Cipro.  Client said he was preparing to go out to lunch with a friend and he was looking forward to socializing with this friend. Javeon said he has support from his son.  Armas is cooperating with in home care providers and cooperating with family members providing assistance to client.  Pablo is trying to attend all scheduled medical appointments. CSW invited Jeramiah to call CSW at [phone] as needed to discuss social work needs of client. Client was appreciative of phone call from [place] on 06/09/15.  Plan: Client to take medications as prescribed and attend scheduled medical appointments. Client to communicate medical needs of client to Dr. Tula Nakayama, primary physician for client. CSW to call client in two weeks to assess needs of client at that time.  Norva Riffle.Lexington Devine MSW, LCSW Licensed Clinical Social Worker [facility] Care Management [phone]

## 2015-06-14 DIAGNOSIS — M25512 Pain in left shoulder: Secondary | ICD-10-CM | POA: Diagnosis not present

## 2015-06-15 ENCOUNTER — Encounter (HOSPITAL_COMMUNITY): Payer: Self-pay | Admitting: *Deleted

## 2015-06-15 ENCOUNTER — Emergency Department (HOSPITAL_COMMUNITY)
Admission: EM | Admit: 2015-06-15 | Discharge: 2015-06-15 | Disposition: A | Discharge status: Home / Self Care | Payer: Commercial Managed Care - HMO | Attending: Emergency Medicine | Admitting: Emergency Medicine

## 2015-06-15 ENCOUNTER — Emergency Department (HOSPITAL_COMMUNITY): Payer: Commercial Managed Care - HMO

## 2015-06-15 DIAGNOSIS — Z8701 Personal history of pneumonia (recurrent): Secondary | ICD-10-CM | POA: Diagnosis not present

## 2015-06-15 DIAGNOSIS — Z8719 Personal history of other diseases of the digestive system: Secondary | ICD-10-CM | POA: Diagnosis not present

## 2015-06-15 DIAGNOSIS — Z86718 Personal history of other venous thrombosis and embolism: Secondary | ICD-10-CM | POA: Insufficient documentation

## 2015-06-15 DIAGNOSIS — M1990 Unspecified osteoarthritis, unspecified site: Secondary | ICD-10-CM | POA: Insufficient documentation

## 2015-06-15 DIAGNOSIS — Z87438 Personal history of other diseases of male genital organs: Secondary | ICD-10-CM | POA: Diagnosis not present

## 2015-06-15 DIAGNOSIS — I10 Essential (primary) hypertension: Secondary | ICD-10-CM | POA: Diagnosis not present

## 2015-06-15 DIAGNOSIS — E119 Type 2 diabetes mellitus without complications: Secondary | ICD-10-CM | POA: Diagnosis not present

## 2015-06-15 DIAGNOSIS — R52 Pain, unspecified: Secondary | ICD-10-CM

## 2015-06-15 DIAGNOSIS — E782 Mixed hyperlipidemia: Secondary | ICD-10-CM | POA: Diagnosis not present

## 2015-06-15 DIAGNOSIS — M542 Cervicalgia: Secondary | ICD-10-CM | POA: Diagnosis not present

## 2015-06-15 DIAGNOSIS — M4322 Fusion of spine, cervical region: Secondary | ICD-10-CM | POA: Diagnosis not present

## 2015-06-15 DIAGNOSIS — Z79899 Other long term (current) drug therapy: Secondary | ICD-10-CM | POA: Insufficient documentation

## 2015-06-15 DIAGNOSIS — M25512 Pain in left shoulder: Secondary | ICD-10-CM | POA: Diagnosis not present

## 2015-06-15 DIAGNOSIS — F0390 Unspecified dementia without behavioral disturbance: Secondary | ICD-10-CM | POA: Diagnosis not present

## 2015-06-15 DIAGNOSIS — Z86711 Personal history of pulmonary embolism: Secondary | ICD-10-CM | POA: Diagnosis not present

## 2015-06-15 MED ORDER — OXYCODONE-ACETAMINOPHEN 5-325 MG PO TABS
1.0000 | ORAL_TABLET | Freq: Once | ORAL | Status: AC
  Administered 2015-06-15: 1 via ORAL
  Filled 2015-06-15: qty 1

## 2015-06-15 NOTE — ED Notes (Signed)
Left shoulder pain x 1 week. Pain increases with touch or movement. Pt recently seen for similar pair to right shoulder per pt.

## 2015-06-15 NOTE — ED Provider Notes (Signed)
CSN: [id number]     Arrival date & time 06/15/15  9163 History   First MD Initiated Contact with Patient 06/15/15 [phone]     Chief Complaint  Patient presents with  . Shoulder Pain    Patient is a 81 y.o. male presenting with shoulder pain. The history is provided by the patient.  Shoulder Pain Location:  Shoulder Shoulder location:  L shoulder Pain details:    Quality:  Aching   Radiates to:  Does not radiate   Severity:  Moderate   Onset quality:  Gradual   Duration:  4 days   Timing:  Constant   Progression:  Worsening Chronicity:  New Relieved by:  Rest Worsened by:  Movement Associated symptoms: decreased range of motion, neck pain and stiffness   Associated symptoms: no back pain, no fever and no muscle weakness   pt reports pain in left shoulder and some pain in left neck No cp/sob No falls/injury No new weakness reported  Past Medical History  Diagnosis Date  . Type 2 diabetes mellitus   . Mixed hyperlipidemia   . Essential hypertension, benign   . Dementia   . BPH (benign prostatic hypertrophy)   . Pulmonary embolism     Diagnosed 7/12  . Deep venous thrombosis     Left leg, diagnosed 7/12  . Osteoarthritis   . Diverticulitis   . Acute cholecystitis 12/12/2012 to 12/19/2012    drainage tube placed, hosptalizred x 1 week, then nursing home  . Pneumonia    Past Surgical History  Procedure Laterality Date  . Neck fusion    . Carpal tunnel release    . Right total knee replacement  04/23/2011  . Colonoscopy  08/31/2012    Procedure: COLONOSCOPY;  Surgeon: Rogene Houston, MD;  [facility];  Service: Endoscopy;  Laterality: N/A;  830  . Eye surgery      bilateral cataract  . Hernia repair      umbilical  . Spine surgery      neck fusion   Family History  Problem Relation Age of Onset  . Cancer Mother     Stomach  . Diabetes Sister   . Hypertension Sister   . Diabetes Brother   . Cancer Brother     Gallbladder   History  Substance Use  Topics  . Smoking status: Never Smoker   . Smokeless tobacco: Never Used  . Alcohol Use: No    Review of Systems  Constitutional: Negative for fever.  Respiratory: Negative for shortness of breath.   Cardiovascular: Negative for chest pain.  Musculoskeletal: Positive for arthralgias, stiffness and neck pain. Negative for back pain.  Neurological: Negative for weakness.  All other systems reviewed and are negative.     Allergies  Vancomycin and Zosyn  Home Medications   Prior to Admission medications   Medication Sig Start Date End Date Taking? Authorizing Provider  acetaminophen (TYLENOL) 500 MG tablet Take 1,000 mg by mouth every 6 (six) hours as needed for moderate pain.    Historical Provider, MD  benazepril (LOTENSIN) 5 MG tablet TAKE ONE (1) TABLET EACH DAY Patient taking differently: Take 5 mg by mouth daily.  03/07/15   [name], MD  ciprofloxacin (CIPRO) 500 MG tablet Take 1 tablet (500 mg total) by mouth 2 (two) times daily. One po bid x 7 days 06/02/15   Veryl Speak, MD  clobetasol cream (TEMOVATE) 5.99 % Apply 1 application topically 2 (two) times daily. Patient taking  differently: Apply 1 application topically 2 (two) times daily as needed (rash).  03/18/15   [name], MD  donepezil (ARICEPT) 10 MG tablet TAKE ONE (1) TABLET AT BEDTIME Patient taking differently: Take by mouth at bedtime.  03/07/15   [name], MD  ferrous sulfate 325 (65 FE) MG tablet TAKE 1 TABLET TWICE DAILY 05/23/15   [name], MD  fluticasone [facility]) 50 MCG/ACT nasal spray Place 2 sprays into both nostrils daily. Patient not taking: Reported on 06/02/2015 01/31/14   [name], MD  glipiZIDE (GLUCOTROL XL) 10 MG 24 hr tablet Two tablets every morning at breakfast Patient taking differently: Take 20 mg by mouth daily with breakfast.  03/07/15   [name], MD  HYDROcodone-acetaminophen (NORCO) 5-325 MG per tablet Take 1-2 tablets by mouth every 6 (six)  hours as needed. 06/02/15   Veryl Speak, MD  linagliptin (TRADJENTA) 5 MG TABS tablet Take 1 tablet (5 mg total) by mouth daily. 04/01/15   [name], MD  lovastatin (MEVACOR) 40 MG tablet Take 1 tablet (40 mg total) by mouth at bedtime. 03/07/15   [name], MD  metroNIDAZOLE (FLAGYL) 500 MG tablet Take 1 tablet (500 mg total) by mouth 3 (three) times daily. One po bid x 7 days 06/02/15   Veryl Speak, MD  mirtazapine (REMERON) 7.5 MG tablet Take 1 tablet (7.5 mg total) by mouth at bedtime. Patient not taking: Reported on 06/02/2015 12/30/14   [name], MD  omeprazole (PRILOSEC) 40 MG capsule Take 1 capsule (40 mg total) by mouth daily. 03/07/15   [name], MD  Polyethylene Glycol 400 (BLINK TEARS OP) Apply 1 drop to eye daily as needed (dry eyes).    Historical Provider, MD  sertraline (ZOLOFT) 50 MG tablet TAKE ONE (1) TABLET EACH DAY Patient taking differently: Take 50 mg by mouth daily. TAKE ONE (1) TABLET EACH DAY 03/07/15   [name], MD  tamsulosin (FLOMAX) 0.4 MG CAPS capsule Take 1 capsule (0.4 mg total) by mouth daily. 03/18/15   [name], MD  traMADol (ULTRAM) 50 MG tablet Take 1 tablet (50 mg total) by mouth 2 (two) times daily. 05/26/15   [name], MD  Vitamin D, Ergocalciferol, (DRISDOL) 50000 UNITS CAPS capsule Take 1 capsule (50,000 Units total) by mouth every 7 (seven) days. Takes on Fridays. 03/18/15   [name], MD   BP 169/94 mmHg  Pulse 92  Temp(Src) 98.3 F (36.8 C) (Oral)  Resp 18  Ht 6' (1.829 m)  Wt 254 lb (115.214 kg)  BMI 34.44 kg/m2  SpO2 98% Physical Exam CONSTITUTIONAL: Well developed/well nourished HEAD: Normocephalic/atraumatic EYES: EOMI/PERRL ENMT: Mucous membranes moist NECK: supple no meningeal signs SPINE/BACK:entire spine nontender, mild cervical paraspinal tenderness CV: S1/S2 noted, no murmurs/rubs/gallops noted LUNGS: Lungs are clear to auscultation bilaterally, no apparent  distress ABDOMEN: soft, nontender, no rebound or guarding, bowel sounds noted throughout abdomen GU:no cva tenderness NEURO: Pt is awake/alert/appropriate, moves all extremitiesx4.  No facial droop.   Equal power with hand grip, wrist flex/extension, elbow flex/extension.  Decreased ROM of left shoulder due to pain EXTREMITIES: pulses normal/equal, full ROM.  Tenderness to palpation of left anterior shoulder.  No deformity.  He can abduct left shoulder but limited due to pain.  No tenderness to left elbow/wrist.   SKIN: warm, color normal PSYCH: no abnormalities of mood noted, alert and oriented to situation  ED Course  Procedures  Medications  oxyCODONE-acetaminophen (PERCOCET/ROXICET) 5-325 MG per tablet 1 tablet (1 tablet Oral Given 06/15/15 0809)    Imaging Review Dg Cervical Spine Complete  06/15/2015   CLINICAL DATA:  Severe left shoulder pain.  EXAM: CERVICAL SPINE  4+ VIEWS  COMPARISON:  None  FINDINGS: There is reversal of normal cervical lordosis. Surgical fusion of C4 through C6 noted. The remaining cervical spine levels appear ankylosed. No fracture or dislocation identified.  IMPRESSION: 1. No acute finding identified. 2. Solid fusion of the entire cervical spine.   Electronically Signed   By: Kerby Moors M.D.   On: 06/15/2015 08:15   Dg Shoulder Left  06/15/2015   CLINICAL DATA:  81 year old male with severe left shoulder and arm pain for the past 2-3 days  EXAM: LEFT SHOULDER - 2+ VIEW  COMPARISON:  CT scan of the cervical spine obtained concurrently  FINDINGS: Very limited views of the left shoulder secondary to patient position an lack of mobility. There is no evidence of displaced fracture. The left humeral head may be slightly subluxed anteriorly, however this is favored to be positional and related to the patient's kyphotic positioning. No lytic or blastic osseous lesion. The visualized portion of the thorax is unremarkable.  IMPRESSION: 1. Very limited views of the left  shoulder secondary to patient position and decreased mobility. 2. The appearance of anterior subluxation is favored to be positional and related to the patient's kyphotic posture. 3. No displaced fracture or lytic or blastic osseous lesion identified.   Electronically Signed   By: Jacqulynn Cadet M.D.   On: 06/15/2015 08:19   Pt improved He can abduct arm on his own and can bring to horizontal I doubt dislocation at this time He has no other complaints Sling for comfort (advised to range arm each day) and ortho f/u He has tramadol at home    MDM   Final diagnoses:  Pain  Pain in left shoulder    Nursing notes including past medical history and social history reviewed and considered in documentation xrays/imaging reviewed by myself and considered during evaluation     Ripley Fraise, MD 06/15/15 [phone]

## 2015-06-16 ENCOUNTER — Telehealth: Payer: Self-pay | Admitting: *Deleted

## 2015-06-16 NOTE — Telephone Encounter (Signed)
Pt was seen in the ER this weekend and is scheduled for tomorrow

## 2015-06-17 ENCOUNTER — Ambulatory Visit (INDEPENDENT_AMBULATORY_CARE_PROVIDER_SITE_OTHER): Payer: Commercial Managed Care - HMO | Admitting: Family Medicine

## 2015-06-17 ENCOUNTER — Encounter: Payer: Self-pay | Admitting: Family Medicine

## 2015-06-17 VITALS — BP 140/76 | HR 97 | Resp 18 | Ht 70.0 in | Wt 240.1 lb

## 2015-06-17 DIAGNOSIS — E785 Hyperlipidemia, unspecified: Secondary | ICD-10-CM | POA: Diagnosis not present

## 2015-06-17 DIAGNOSIS — K5730 Diverticulosis of large intestine without perforation or abscess without bleeding: Secondary | ICD-10-CM

## 2015-06-17 DIAGNOSIS — F329 Major depressive disorder, single episode, unspecified: Secondary | ICD-10-CM

## 2015-06-17 DIAGNOSIS — E1165 Type 2 diabetes mellitus with hyperglycemia: Secondary | ICD-10-CM

## 2015-06-17 DIAGNOSIS — E1129 Type 2 diabetes mellitus with other diabetic kidney complication: Secondary | ICD-10-CM | POA: Diagnosis not present

## 2015-06-17 DIAGNOSIS — M25512 Pain in left shoulder: Secondary | ICD-10-CM

## 2015-06-17 DIAGNOSIS — F5105 Insomnia due to other mental disorder: Secondary | ICD-10-CM | POA: Diagnosis not present

## 2015-06-17 DIAGNOSIS — R938 Abnormal findings on diagnostic imaging of other specified body structures: Secondary | ICD-10-CM

## 2015-06-17 DIAGNOSIS — F0280 Dementia in other diseases classified elsewhere without behavioral disturbance: Secondary | ICD-10-CM

## 2015-06-17 DIAGNOSIS — G309 Alzheimer's disease, unspecified: Secondary | ICD-10-CM

## 2015-06-17 DIAGNOSIS — F0281 Dementia in other diseases classified elsewhere with behavioral disturbance: Secondary | ICD-10-CM

## 2015-06-17 DIAGNOSIS — G308 Other Alzheimer's disease: Secondary | ICD-10-CM

## 2015-06-17 DIAGNOSIS — IMO0002 Reserved for concepts with insufficient information to code with codable children: Secondary | ICD-10-CM

## 2015-06-17 DIAGNOSIS — F409 Phobic anxiety disorder, unspecified: Secondary | ICD-10-CM

## 2015-06-17 DIAGNOSIS — F0393 Unspecified dementia, unspecified severity, with mood disturbance: Secondary | ICD-10-CM

## 2015-06-17 DIAGNOSIS — R9389 Abnormal findings on diagnostic imaging of other specified body structures: Secondary | ICD-10-CM

## 2015-06-17 DIAGNOSIS — J8410 Pulmonary fibrosis, unspecified: Secondary | ICD-10-CM

## 2015-06-17 MED ORDER — KETOROLAC TROMETHAMINE 60 MG/2ML IM SOLN
60.0000 mg | Freq: Once | INTRAMUSCULAR | Status: AC
  Administered 2015-06-17: 60 mg via INTRAMUSCULAR

## 2015-06-17 MED ORDER — BENZONATATE 100 MG PO CAPS: 100.0000 mg | ORAL_CAPSULE | Freq: Two times a day (BID) | ORAL | Status: DC | PRN

## 2015-06-17 MED ORDER — TRAMADOL HCL 50 MG PO TABS: 50.0000 mg | ORAL_TABLET | Freq: Two times a day (BID) | ORAL | Status: DC

## 2015-06-17 NOTE — Patient Instructions (Signed)
F/u 3rd  week in August , cancel appt for next week  Non fast CBC,cmp and EGFR, HBA1C , TSH  2nd week in August   Pls call with ortho Doc name for referral  Tramadol one twice daily for shoulder pain, and tessalon perles for cough

## 2015-06-18 ENCOUNTER — Telehealth: Payer: Self-pay | Admitting: *Deleted

## 2015-06-18 DIAGNOSIS — M25512 Pain in left shoulder: Secondary | ICD-10-CM

## 2015-06-18 NOTE — Telephone Encounter (Signed)
Pt's son called [facility] and said they need a referral sent to [facility] they do not have a name of the place, it is with Dr. Drema Dallas 06/24/15 at 3:15 fax number is [phone]

## 2015-06-18 NOTE — Telephone Encounter (Signed)
Referral entered  

## 2015-06-22 NOTE — Assessment & Plan Note (Signed)
Chronic cough , good response to tessalon perles, same prescribed for as needed use

## 2015-06-22 NOTE — Assessment & Plan Note (Signed)
Stable and behavior now stable no behavioral disturbance reported by son

## 2015-06-22 NOTE — Assessment & Plan Note (Signed)
Resolution of acute flare

## 2015-06-22 NOTE — Assessment & Plan Note (Signed)
C/o chronic cough, decongestants prescribed

## 2015-06-22 NOTE — Assessment & Plan Note (Signed)
2 week h/o increased pain with reduced mobility, refer to ortho for further management

## 2015-06-22 NOTE — Progress Notes (Signed)
Samuel Ryan     MRN: [id number]      DOB: 06-26-1931   HPI Samuel Ryan is here for follow up of recent ED visit for acute left shoulder pain with marked limitation in mobility, no known trauma, has f/u scheduled with his ortho Doc next week. Had also been in ED with acute diverticulitis a few weeks prior, current denies any abdominal pain, or rectal blood or change in BM. Also has  re-evaluation of chronic medical conditions, medication management and review of any available recent lab and radiology data.  Preventive health is updated, specifically  Cancer screening and Immunization.   The PT denies any adverse reactions to current medications since the last visit.  2 week h/o increased left shoulder pain with reduced mobility, no known direct trauma to the shoulder Reports improved blood sugars, morning sugar seldom over 130 Denies polyuria, polydipsia, blurred vision , or hypoglycemic episodes.   ROS Denies recent fever or chills. Denies sinus pressure, nasal congestion, ear pain or sore throat. C/o  chest congestion, with  cough denies wheezing. Denies chest pains, palpitations and leg swelling Denies abdominal pain, nausea, vomiting,diarrhea or constipation.    Denies headaches, seizures, numbness, or tingling. Denies depression, anxiety or insomnia. Denies skin break down or rash.   PE  BP 140/76 mmHg  Pulse 97  Resp 18  Ht 5\' 10"  (1.778 m)  Wt 240 lb 1.9 oz (108.918 kg)  BMI 34.45 kg/m2  SpO2 94%  Patient alert and oriented and in no cardiopulmonary distress.  HEENT: No facial asymmetry, EOMI,   oropharynx pink and moist.  Neck decreased ROM no JVD, no mass.  Chest: .Adequate though reduced  air entry bilaterally, basilar  crackles no wheezes  CVS: S1, S2 no murmurs, no S3.Regular rate.  ABD: Soft non tender.   Ext: No edema  MS: decreased  ROM spine, shoulders, hips and knees.Marked limitation in mobility of left shoulder  Skin: Intact, no ulcerations or  rash noted.  Psych: Good eye contact, normal affect. Memory  impaired not anxious or depressed appearing.  CNS: CN 2-12 intact, power,  normal throughout.no focal deficits noted.   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   The need to take medication as prescribed, test blood sugar as directed, and to call between visits if there is a concern that blood sugar is uncontrolled is also discussed.   Samuel Ryan is reminded of the importance of daily foot exam, annual eye examination, and good blood sugar, blood pressure and cholesterol control. Reports improved control Updated lab needed at/ before next visit.   Diabetic Labs Latest Ref Rng 06/02/2015 03/28/2015 02/13/2015 12/24/2014  10/13/2014  HbA1c <5.7 % - 8.7(H) - 8.0(H) -  Microalbumin 0.00 - 1.89 mg/dL - - - - -  Micro/Creat Ratio 0.0 - 30.0 mg/g - - - - -  Chol 0 - 200 mg/dL - 158 - - -  HDL >=40 mg/dL - 48 - - -  Calc LDL 0 - 99 mg/dL - 91 - - -  Triglycerides <150 mg/dL - 97 - - -  Creatinine 0.61 - 1.24 mg/dL 1.40(H) 1.43(H) 1.66(H) 1.47(H) 1.53(H)    BP/Weight 06/17/2015 06/15/2015 06/02/2015 03/24/2015 02/13/2015 12/30/2014 82/07/5620  Systolic BP 308 657 846 962 952 841 324  Diastolic BP 76 96 90 82 73 60 80  Wt. (Lbs) 240.12 254 254 247.12 264 250.04 246  BMI 34.45 34.44 34.44 35.46 35.8 35.88 35.3   Foot/eye exam completion dates Latest Ref Rng 09/23/2014 07/16/2014  Eye Exam No Retinopathy No Retinopathy -  Foot Form Completion - - Done

## 2015-06-22 NOTE — Assessment & Plan Note (Signed)
Controlled, no change in medication  

## 2015-06-22 NOTE — Assessment & Plan Note (Signed)
Samuel Ryan is reminded of the importance of commitment to daily physical activity for 30 minutes or more, as able and the need to limit carbohydrate intake to 30 to 60 grams per meal to help with blood sugar control.   The need to take medication as prescribed, test blood sugar as directed, and to call between visits if there is a concern that blood sugar is uncontrolled is also discussed.   Samuel Ryan is reminded of the importance of daily foot exam, annual eye examination, and good blood sugar, blood pressure and cholesterol control. Reports improved control Updated lab needed at/ before next visit.   Diabetic Labs Latest Ref Rng 06/02/2015 03/28/2015 02/13/2015 12/24/2014 10/13/2014  HbA1c <5.7 % - 8.7(H) - 8.0(H) -  Microalbumin 0.00 - 1.89 mg/dL - - - - -  Micro/Creat Ratio 0.0 - 30.0 mg/g - - - - -  Chol 0 - 200 mg/dL - 158 - - -  HDL >=40 mg/dL - 48 - - -  Calc LDL 0 - 99 mg/dL - 91 - - -  Triglycerides <150 mg/dL - 97 - - -  Creatinine 0.61 - 1.24 mg/dL 1.40(H) 1.43(H) 1.66(H) 1.47(H) 1.53(H)   BP/Weight 06/17/2015 06/15/2015 06/02/2015 03/24/2015 02/13/2015 12/30/2014 67/03/9162  Systolic BP 846 659 935 701 779 390 300  Diastolic BP 76 96 90 82 73 60 80  Wt. (Lbs) 240.12 254 254 247.12 264 250.04 246  BMI 34.45 34.44 34.44 35.46 35.8 35.88 35.3   Foot/eye exam completion dates Latest Ref Rng 09/23/2014 07/16/2014  Eye Exam No Retinopathy No Retinopathy -  Foot Form Completion - - Done

## 2015-06-23 ENCOUNTER — Ambulatory Visit: Payer: Commercial Managed Care - HMO | Admitting: Family Medicine

## 2015-06-24 DIAGNOSIS — M7502 Adhesive capsulitis of left shoulder: Secondary | ICD-10-CM | POA: Diagnosis not present

## 2015-06-24 DIAGNOSIS — M25612 Stiffness of left shoulder, not elsewhere classified: Secondary | ICD-10-CM | POA: Diagnosis not present

## 2015-06-24 NOTE — Telephone Encounter (Signed)
Referral has been sent.

## 2015-06-30 DIAGNOSIS — E1165 Type 2 diabetes mellitus with hyperglycemia: Secondary | ICD-10-CM | POA: Diagnosis not present

## 2015-06-30 DIAGNOSIS — E1129 Type 2 diabetes mellitus with other diabetic kidney complication: Secondary | ICD-10-CM | POA: Diagnosis not present

## 2015-07-01 LAB — CBC WITH DIFFERENTIAL/PLATELET
Basophils Absolute: 0 10*3/uL (ref 0.0–0.1)
Basophils Relative: 0 % (ref 0–1)
EOS PCT: 3 % (ref 0–5)
Eosinophils Absolute: 0.1 10*3/uL (ref 0.0–0.7)
HCT: 34.8 % — ABNORMAL LOW (ref 39.0–52.0)
Hemoglobin: 11.5 g/dL — ABNORMAL LOW (ref 13.0–17.0)
LYMPHS ABS: 1.7 10*3/uL (ref 0.7–4.0)
LYMPHS PCT: 38 % (ref 12–46)
MCH: 29.9 pg (ref 26.0–34.0)
MCHC: 33 g/dL (ref 30.0–36.0)
MCV: 90.6 fL (ref 78.0–100.0)
MONO ABS: 0.4 10*3/uL (ref 0.1–1.0)
MPV: 9.1 fL (ref 8.6–12.4)
Monocytes Relative: 10 % (ref 3–12)
Neutro Abs: 2.2 10*3/uL (ref 1.7–7.7)
Neutrophils Relative %: 49 % (ref 43–77)
Platelets: 195 10*3/uL (ref 150–400)
RBC: 3.84 MIL/uL — ABNORMAL LOW (ref 4.22–5.81)
RDW: 14.2 % (ref 11.5–15.5)
WBC: 4.4 10*3/uL (ref 4.0–10.5)

## 2015-07-01 LAB — COMPLETE METABOLIC PANEL WITH GFR
ALK PHOS: 58 U/L (ref 40–115)
ALT: 7 U/L — AB (ref 9–46)
AST: 13 U/L (ref 10–35)
Albumin: 3.4 g/dL — ABNORMAL LOW (ref 3.6–5.1)
BUN: 14 mg/dL (ref 7–25)
CO2: 27 mmol/L (ref 20–31)
CREATININE: 1.51 mg/dL — AB (ref 0.70–1.11)
Calcium: 8.6 mg/dL (ref 8.6–10.3)
Chloride: 102 mmol/L (ref 98–110)
GFR, EST NON AFRICAN AMERICAN: 42 mL/min — AB (ref >=60)
GFR, Est African American: 49 mL/min — ABNORMAL LOW (ref >=60)
GLUCOSE: 133 mg/dL — AB (ref 65–99)
Potassium: 4.6 mmol/L (ref 3.5–5.3)
Sodium: 141 mmol/L (ref 135–146)
Total Bilirubin: 0.5 mg/dL (ref 0.2–1.2)
Total Protein: 6.5 g/dL (ref 6.1–8.1)

## 2015-07-01 LAB — TSH: TSH: 0.458 u[IU]/mL (ref 0.350–4.500)

## 2015-07-01 LAB — HEMOGLOBIN A1C
HEMOGLOBIN A1C: 8.2 % — AB (ref <5.7)
Mean Plasma Glucose: 189 mg/dL — ABNORMAL HIGH (ref <117)

## 2015-07-02 ENCOUNTER — Other Ambulatory Visit: Payer: Self-pay | Admitting: Licensed Clinical Social Worker

## 2015-07-02 NOTE — Patient Outreach (Signed)
Assessment: CSW completed chart review on client on 07/02/15.  CSW called client on 07/02/15.  CSW verified identity of client.  CSW spoke with client on 07/02/15 about needs of client.  Client said he has his prescribed medications. He said he has adequate food supply.  He said he is sleeping well. He said he still receives in home care support with home health aide daily (as scheduled). He said he has family support.  He said he has had friends to visit him periodically. He reported that he has appointment scheduled with Dr. Moshe Cipro next week.  Client said that his doctor had ordered 3 in 1 bedside commode for client but client's insurance had declined to pay for 3 in 1 bedside commode. CSW encouraged client to see if his son could take him to a medical supply company and see if client could just purchase a 3 in 1 bedside commode since his insurance would not cover this piece of equipment.  Client said he would talk with his son about helping him to visit medical equipment company to inquire about equipment item needed. He said his insurance company would not pay for lift chair for client.  CSW and client spoke of lift chair customary cost.  Client seemed to think he would have difficulty even paying for part of the costs of a lift chair. Client is on limited income at present.  CSW reminded client to call CSW at [phone] to discuss social work needs of client.  Plan: Client to take medications as prescribed and to attend scheduled medical appointments. Client to communicate with Dr. Moshe Cipro to discuss medical needs of client. CSW to call client in two weeks to assess needs of client.  Norva Riffle.Samuel Ryan MSW, LCSW Licensed Clinical Social Worker [facility] Care Management [phone]

## 2015-07-07 ENCOUNTER — Ambulatory Visit: Payer: Commercial Managed Care - HMO | Admitting: Physical Therapy

## 2015-07-08 ENCOUNTER — Ambulatory Visit (INDEPENDENT_AMBULATORY_CARE_PROVIDER_SITE_OTHER): Payer: Commercial Managed Care - HMO | Admitting: Family Medicine

## 2015-07-08 ENCOUNTER — Encounter: Payer: Self-pay | Admitting: Family Medicine

## 2015-07-08 VITALS — BP 138/80 | HR 82 | Resp 18 | Ht 70.0 in | Wt 243.0 lb

## 2015-07-08 DIAGNOSIS — M25512 Pain in left shoulder: Secondary | ICD-10-CM

## 2015-07-08 DIAGNOSIS — E1129 Type 2 diabetes mellitus with other diabetic kidney complication: Secondary | ICD-10-CM | POA: Diagnosis not present

## 2015-07-08 DIAGNOSIS — F0280 Dementia in other diseases classified elsewhere without behavioral disturbance: Secondary | ICD-10-CM

## 2015-07-08 DIAGNOSIS — E559 Vitamin D deficiency, unspecified: Secondary | ICD-10-CM | POA: Diagnosis not present

## 2015-07-08 DIAGNOSIS — F5105 Insomnia due to other mental disorder: Secondary | ICD-10-CM

## 2015-07-08 DIAGNOSIS — F0393 Unspecified dementia, unspecified severity, with mood disturbance: Secondary | ICD-10-CM

## 2015-07-08 DIAGNOSIS — F0281 Dementia in other diseases classified elsewhere with behavioral disturbance: Secondary | ICD-10-CM

## 2015-07-08 DIAGNOSIS — Z9181 History of falling: Secondary | ICD-10-CM

## 2015-07-08 DIAGNOSIS — E785 Hyperlipidemia, unspecified: Secondary | ICD-10-CM

## 2015-07-08 DIAGNOSIS — G309 Alzheimer's disease, unspecified: Secondary | ICD-10-CM

## 2015-07-08 DIAGNOSIS — E1165 Type 2 diabetes mellitus with hyperglycemia: Secondary | ICD-10-CM

## 2015-07-08 DIAGNOSIS — F329 Major depressive disorder, single episode, unspecified: Secondary | ICD-10-CM

## 2015-07-08 DIAGNOSIS — G308 Other Alzheimer's disease: Secondary | ICD-10-CM

## 2015-07-08 DIAGNOSIS — IMO0002 Reserved for concepts with insufficient information to code with codable children: Secondary | ICD-10-CM

## 2015-07-08 DIAGNOSIS — R296 Repeated falls: Secondary | ICD-10-CM

## 2015-07-08 DIAGNOSIS — D508 Other iron deficiency anemias: Secondary | ICD-10-CM | POA: Diagnosis not present

## 2015-07-08 DIAGNOSIS — F409 Phobic anxiety disorder, unspecified: Secondary | ICD-10-CM

## 2015-07-08 NOTE — Patient Instructions (Addendum)
Annual wellness end Nov/early Dec, call if you need me sooner  Thankful you are doing very well, no med changes  Fasting lipid, cmp and EGFr, HBa1C , HB , iron, ferritin and B12  And microalb End Nov 5 days before visit  Call for flu vaccine in September

## 2015-07-09 ENCOUNTER — Ambulatory Visit: Payer: Commercial Managed Care - HMO | Admitting: Physical Therapy

## 2015-07-09 NOTE — Assessment & Plan Note (Signed)
Improved, no med change Samuel Ryan is reminded of the importance of commitment to daily physical activity for 30 minutes or more, as able and the need to limit carbohydrate intake to 30 to 60 grams per meal to help with blood sugar control.   The need to take medication as prescribed, test blood sugar as directed, and to call between visits if there is a concern that blood sugar is uncontrolled is also discussed.   Samuel Ryan is reminded of the importance of daily foot exam, annual eye examination, and good blood sugar, blood pressure and cholesterol control.  Diabetic Labs Latest Ref Rng 06/30/2015 06/02/2015 03/28/2015 02/13/2015 12/24/2014  HbA1c <5.7 % 8.2(H) - 8.7(H) - 8.0(H)  Microalbumin 0.00 - 1.89 mg/dL - - - - -  Micro/Creat Ratio 0.0 - 30.0 mg/g - - - - -  Chol 0 - 200 mg/dL - - 158 - -  HDL >=40 mg/dL - - 48 - -  Calc LDL 0 - 99 mg/dL - - 91 - -  Triglycerides <150 mg/dL - - 97 - -  Creatinine 0.70 - 1.11 mg/dL 1.51(H) 1.40(H) 1.43(H) 1.66(H) 1.47(H)   BP/Weight 07/08/2015 06/17/2015 06/15/2015 06/02/2015 03/24/2015 8/33/8250 03/31/9766  Systolic BP 341 937 902 409 735 329 924  Diastolic BP 84 76 96 90 82 73 60  Wt. (Lbs) 243 240.12 254 254 247.12 264 250.04  BMI 34.87 34.45 34.44 34.44 35.46 35.8 35.88   Foot/eye exam completion dates Latest Ref Rng 09/23/2014 07/16/2014  Eye Exam No Retinopathy No Retinopathy -  Foot Form Completion - - Done

## 2015-07-09 NOTE — Assessment & Plan Note (Signed)
Controlled, no change in medication  

## 2015-07-09 NOTE — Progress Notes (Signed)
Samuel Ryan     MRN: [id number]      DOB: [birth year]   HPI Samuel Ryan is here for follow up and re-evaluation of chronic medical conditions, medication management and review of any available recent lab and radiology data.  Preventive health is updated, specifically  Cancer screening and Immunization.   Questions or concerns regarding consultations or procedures which the PT has had in the interim are  Addressed.Shoulder pain resolved without therapy The PT denies any adverse reactions to current medications since the last visit.  Denies polyuria, polydipsia, blurred vision , or hypoglycemic episodes. There are no specific complaints   ROS Denies recent fever or chills. Denies sinus pressure, nasal congestion, ear pain or sore throat. Denies chest congestion, productive cough or wheezing. Denies chest pains, palpitations and leg swelling Denies abdominal pain, nausea, vomiting,diarrhea or constipation.    Chronic  joint pain,  and limitation in mobility. Denies headaches, seizures, numbness, or tingling. Denies depression, anxiety or insomnia. Denies skin break down or rash.   PE  BP 138/80 mmHg  Pulse 82  Resp 18  Ht 5\' 10"  (1.778 m)  Wt 243 lb (110.224 kg)  BMI 34.87 kg/m2  SpO2 94%  Patient alert and oriented and in no cardiopulmonary distress.  HEENT: No facial asymmetry, EOMI,   oropharynx pink and moist.  Neck supple no JVD, no mass.  Chest: Clear to auscultation bilaterally.  CVS: S1, S2 no murmurs, no S3.Regular rate.  ABD: Soft non tender.   Ext: No edema  MS: Adequate though reduced  ROM spine, shoulders, hips and knees.  Skin: Intact, no ulcerations or rash noted.  Psych: Good eye contact, normal affect. Not anxious or depressed appearing.  CNS: CN 2-12 intact, power,  normal throughout.no focal deficits noted.   Assessment & Plan   Type 2 diabetes, uncontrolled, with renal manifestation Improved, no med change [name] is reminded of the  importance of commitment to daily physical activity for 30 minutes or more, as able and the need to limit carbohydrate intake to 30 to 60 grams per meal to help with blood sugar control.   The need to take medication as prescribed, test blood sugar as directed, and to call between visits if there is a concern that blood sugar is uncontrolled is also discussed.   [name] is reminded of the importance of daily foot exam, annual eye examination, and good blood sugar, blood pressure and cholesterol control.  Diabetic Labs Latest Ref Rng 06/30/2015 06/02/2015 03/28/2015 02/13/2015 12/24/2014  HbA1c <5.7 % 8.2(H) - 8.7(H) - 8.0(H)  Microalbumin 0.00 - 1.89 mg/dL - - - - -  Micro/Creat Ratio 0.0 - 30.0 mg/g - - - - -  Chol 0 - 200 mg/dL - - 158 - -  HDL >=40 mg/dL - - 48 - -  Calc LDL 0 - 99 mg/dL - - 91 - -  Triglycerides <150 mg/dL - - 97 - -  Creatinine 0.70 - 1.11 mg/dL 1.51(H) 1.40(H) 1.43(H) 1.66(H) 1.47(H)   BP/Weight 07/08/2015 06/17/2015 06/15/2015 06/02/2015 03/24/2015 4/62/7035 0/0/9381  Systolic BP 829 937 169 678 938 101 751  Diastolic BP 84 76 96 90 82 73 60  Wt. (Lbs) 243 240.12 254 254 247.12 264 250.04  BMI 34.87 34.45 34.44 34.44 35.46 35.8 35.88   Foot/eye exam completion dates Latest Ref Rng 09/23/2014 07/16/2014  Eye Exam No Retinopathy No Retinopathy -  Foot Form Completion - - Done  Shoulder pain, left Resolved, did not require therapy, did see ortho, encouraged daily exercise involving the shoulder  At high risk for falls Home safety reviewed , no falls this year, relies on assistive device  Hyperlipidemia with target LDL less than 100 Hyperlipidemia:Low fat diet discussed and encouraged.   Lipid Panel  Lab Results  Component Value Date   CHOL 158 03/28/2015   HDL 48 03/28/2015   LDLCALC 91 03/28/2015   TRIG 97 03/28/2015   CHOLHDL 3.3 03/28/2015    Controlled, no change in medication     Insomnia due to anxiety and fear Controlled, no change in  medication   Dementia of Alzheimer's type with behavioral disturbance Controlled, no change in medication   Depression due to dementia Controlled, no change in medication

## 2015-07-09 NOTE — Assessment & Plan Note (Signed)
Home safety reviewed , no falls this year, relies on assistive device

## 2015-07-09 NOTE — Assessment & Plan Note (Signed)
Hyperlipidemia:Low fat diet discussed and encouraged.   Lipid Panel  Lab Results  Component Value Date   CHOL 158 03/28/2015   HDL 48 03/28/2015   LDLCALC 91 03/28/2015   TRIG 97 03/28/2015   CHOLHDL 3.3 03/28/2015    Controlled, no change in medication

## 2015-07-09 NOTE — Assessment & Plan Note (Signed)
Resolved, did not require therapy, did see ortho, encouraged daily exercise involving the shoulder

## 2015-07-10 ENCOUNTER — Encounter: Payer: Self-pay | Admitting: Licensed Clinical Social Worker

## 2015-07-10 ENCOUNTER — Other Ambulatory Visit: Payer: Self-pay | Admitting: Licensed Clinical Social Worker

## 2015-07-10 NOTE — Patient Outreach (Signed)
Assessment: CSW called home phone number of client on 07/10/15.  CSW verified identity of client.  CSW and client spoke of current needs of client.  Client said he had been receiving support as needed from his family. He said he has friends who call him and periodically visit him at his home. He said he continued to receive in home care through a home health aid daily as scheduled.  He said he had his prescribed medications. He continues to see Dr. Moshe Cipro as scheduled as his primary doctor.  He said he had an appointment with Dr. Moshe Cipro this week.  He said he is sleeping well.  He said he has his prescribed medications.  He said that home health aide helps in cleaning home.  He said that he has transport support from family and friends as needed. CSW and client spoke of equipment use of client in the home.  CSW informed client that client could go to local [place] office to see 3 in 1 bedside commode and to see if he wished to purchase a 3 in 1 bedside commode.  CSW talked to client about letting medical supply company representative talk with him about types of 3 in 1 bedside commode and making sure client purchased an appropriate equipment item (client is tall and would need 3 in 1 bedside commode that was sturdy and appropriate size for client). Client said he understood this information. CSW informed client that client had met social work care plan goals client had set.  CSW informed client that CSW was discharging client on 07/10/15 from [place] services due to fact that client had met social work care plan goals for client. Client agreed to this plan. He said he was feeling stable and did not have any social work needs at present. CSW reminded Samuel Ryan that if, in the future, he thought he needed support through [facility] program, that he could talk again with Dr. Moshe Cipro about this and see if Dr. Moshe Cipro wished to re-refer client to [facility] program at that time. Client agreed to discharge on 07/10/15 from [place] services. Client was appreciative of support of [facility] CSW over the last few months.  Plan:  CSW is discharging Samuel Ryan from [place] services on 07/10/15 since client has met social work care plan goals set for client.  All client goals have been met. CSW to inform Samuel Ryan on 07/10/15 that [place] discharged client on 07/10/15 from [place] services due to fact that client had met goals for client. CSW sent physician case closure letter to Dr. Moshe Cipro on 07/10/15 regarding case closure for client.   Samuel Ryan.Samuel Ryan MSW, LCSW Licensed Clinical Social Worker [facility] Care Management [phone]

## 2015-07-15 NOTE — Patient Outreach (Signed)
[facility]) Care Management  07/10/2015  TRYSTIN TERHUNE 12/12/1930 [id number]   Notification from Theadore Nan, LCSW to close case due to goals met with [place] Management.  Thanks, Ronnell Freshwater. Ke[place], [place] Assistant Phone: [phone] Fax: [phone]

## 2015-08-06 ENCOUNTER — Telehealth: Payer: Self-pay | Admitting: Family Medicine

## 2015-08-07 ENCOUNTER — Telehealth: Payer: Self-pay | Admitting: Family Medicine

## 2015-08-07 ENCOUNTER — Other Ambulatory Visit: Payer: Self-pay

## 2015-08-07 MED ORDER — GLUCOSE BLOOD VI STRP: ORAL_STRIP | Status: DC

## 2015-08-07 NOTE — Telephone Encounter (Signed)
Care Giver is calling asking if we could send in an order to get Samuel Ryan Diabetic Test Strips Tru Test Strips 100 to [facility],  Please advise?

## 2015-08-07 NOTE — Telephone Encounter (Signed)
Med refilled.

## 2015-08-08 NOTE — Telephone Encounter (Signed)
Opened in error

## 2015-08-11 DIAGNOSIS — R351 Nocturia: Secondary | ICD-10-CM | POA: Diagnosis not present

## 2015-08-11 DIAGNOSIS — N401 Enlarged prostate with lower urinary tract symptoms: Secondary | ICD-10-CM | POA: Diagnosis not present

## 2015-08-14 ENCOUNTER — Telehealth: Payer: Self-pay | Admitting: *Deleted

## 2015-08-14 ENCOUNTER — Other Ambulatory Visit: Payer: Self-pay

## 2015-08-14 MED ORDER — BENZONATATE 100 MG PO CAPS: 100.0000 mg | ORAL_CAPSULE | Freq: Two times a day (BID) | ORAL | Status: DC

## 2015-08-14 MED ORDER — PREDNISONE 5 MG PO TABS: 5.0000 mg | ORAL_TABLET | Freq: Two times a day (BID) | ORAL | Status: DC

## 2015-08-14 NOTE — Telephone Encounter (Signed)
Samuel Ryan called to talk with a nurse

## 2015-08-14 NOTE — Telephone Encounter (Signed)
Two meds tessalon perles and prednisone prescribed encourage adequate fluid intake, C Javier Glazier spoke with pt

## 2015-08-14 NOTE — Telephone Encounter (Signed)
States for the past 3-4 weeks he has had a bad cough. Coughs so much he gets hot and sweaty. Sometimes he brings up thick white mucus but no sinus congestion, fever or other symptoms of illness. Wants something called into [place]. Please advise

## 2015-08-15 ENCOUNTER — Other Ambulatory Visit: Payer: Self-pay

## 2015-08-15 MED ORDER — PREDNISONE 5 MG PO TABS: 5.0000 mg | ORAL_TABLET | Freq: Two times a day (BID) | ORAL | Status: AC

## 2015-08-15 MED ORDER — BENZONATATE 100 MG PO CAPS: 100.0000 mg | ORAL_CAPSULE | Freq: Two times a day (BID) | ORAL | Status: DC

## 2015-08-19 ENCOUNTER — Other Ambulatory Visit: Payer: Self-pay | Admitting: Family Medicine

## 2015-09-16 ENCOUNTER — Other Ambulatory Visit: Payer: Self-pay

## 2015-09-16 ENCOUNTER — Telehealth: Payer: Self-pay | Admitting: *Deleted

## 2015-09-16 MED ORDER — VITAMIN D (ERGOCALCIFEROL) 1.25 MG (50000 UNIT) PO CAPS: ORAL_CAPSULE | ORAL | Status: DC

## 2015-09-16 MED ORDER — TRAMADOL HCL 50 MG PO TABS: 50.0000 mg | ORAL_TABLET | Freq: Two times a day (BID) | ORAL | Status: DC

## 2015-09-16 NOTE — Telephone Encounter (Signed)
Patient called stating he is having pain in groin area and his sugar is running in the 150s and he has a bad cough, I scheduled patient for tomorrow at 2:00, per care giver she states he may have some infection going with the sugar being in the 150s because that is not normal for him. Please advise

## 2015-09-16 NOTE — Telephone Encounter (Signed)
Please advise if you think that this is something that needs to be addressed today.  He is on the schedule for tomorrow.

## 2015-09-16 NOTE — Telephone Encounter (Signed)
Patient aware and will monitor temperature and call if symptoms worsen.

## 2015-09-16 NOTE — Telephone Encounter (Signed)
No mention of fever in note, so am should be OK

## 2015-09-17 ENCOUNTER — Encounter: Payer: Self-pay | Admitting: Family Medicine

## 2015-09-17 ENCOUNTER — Ambulatory Visit (INDEPENDENT_AMBULATORY_CARE_PROVIDER_SITE_OTHER): Payer: Commercial Managed Care - HMO | Admitting: Family Medicine

## 2015-09-17 ENCOUNTER — Ambulatory Visit: Payer: Self-pay | Admitting: Family Medicine

## 2015-09-17 ENCOUNTER — Other Ambulatory Visit: Payer: Self-pay

## 2015-09-17 VITALS — BP 134/84 | HR 84 | Resp 16 | Ht 70.0 in | Wt 246.0 lb

## 2015-09-17 DIAGNOSIS — IMO0002 Reserved for concepts with insufficient information to code with codable children: Secondary | ICD-10-CM

## 2015-09-17 DIAGNOSIS — Z9181 History of falling: Secondary | ICD-10-CM

## 2015-09-17 DIAGNOSIS — E1165 Type 2 diabetes mellitus with hyperglycemia: Secondary | ICD-10-CM

## 2015-09-17 DIAGNOSIS — E1121 Type 2 diabetes mellitus with diabetic nephropathy: Secondary | ICD-10-CM | POA: Diagnosis not present

## 2015-09-17 DIAGNOSIS — R350 Frequency of micturition: Secondary | ICD-10-CM | POA: Diagnosis not present

## 2015-09-17 DIAGNOSIS — F0280 Dementia in other diseases classified elsewhere without behavioral disturbance: Secondary | ICD-10-CM

## 2015-09-17 DIAGNOSIS — R053 Chronic cough: Secondary | ICD-10-CM

## 2015-09-17 DIAGNOSIS — F0393 Unspecified dementia, unspecified severity, with mood disturbance: Secondary | ICD-10-CM

## 2015-09-17 DIAGNOSIS — R05 Cough: Secondary | ICD-10-CM | POA: Diagnosis not present

## 2015-09-17 DIAGNOSIS — R296 Repeated falls: Secondary | ICD-10-CM

## 2015-09-17 DIAGNOSIS — G309 Alzheimer's disease, unspecified: Secondary | ICD-10-CM

## 2015-09-17 DIAGNOSIS — R319 Hematuria, unspecified: Secondary | ICD-10-CM | POA: Diagnosis not present

## 2015-09-17 DIAGNOSIS — G308 Other Alzheimer's disease: Secondary | ICD-10-CM

## 2015-09-17 DIAGNOSIS — F0281 Dementia in other diseases classified elsewhere with behavioral disturbance: Secondary | ICD-10-CM

## 2015-09-17 DIAGNOSIS — F329 Major depressive disorder, single episode, unspecified: Secondary | ICD-10-CM

## 2015-09-17 DIAGNOSIS — F02818 Dementia in other diseases classified elsewhere, unspecified severity, with other behavioral disturbance: Secondary | ICD-10-CM

## 2015-09-17 LAB — POCT URINALYSIS DIPSTICK
Bilirubin, UA: NEGATIVE
GLUCOSE UA: NEGATIVE
Ketones, UA: NEGATIVE
LEUKOCYTES UA: NEGATIVE
NITRITE UA: NEGATIVE
Spec Grav, UA: 1.015
UROBILINOGEN UA: 0.2
pH, UA: 5.5

## 2015-09-17 MED ORDER — BENZONATATE 100 MG PO CAPS: 100.0000 mg | ORAL_CAPSULE | Freq: Two times a day (BID) | ORAL | Status: DC | PRN

## 2015-09-17 MED ORDER — PROMETHAZINE-DM 6.25-15 MG/5ML PO SYRP: 5.0000 mL | ORAL_SOLUTION | Freq: Every evening | ORAL | Status: DC | PRN

## 2015-09-17 MED ORDER — PREDNISONE 5 MG PO TABS: 5.0000 mg | ORAL_TABLET | Freq: Two times a day (BID) | ORAL | Status: DC

## 2015-09-17 NOTE — Addendum Note (Signed)
Addended by: Eual Fines on: 09/17/2015 10:53 AM   Modules accepted: Orders

## 2015-09-17 NOTE — Assessment & Plan Note (Signed)
Check UA if abn will treat and send for c/s prior to rept urology eval

## 2015-09-17 NOTE — Patient Instructions (Addendum)
F/u in December , call if you need me before  Medications sent in for cough, three   Urine being checked for infection and protein , you need to see Dr Alyson Ingles due to blood in urine and also excess wetting  Please work on good  health habits so that your health will improve. 1. Commitment to daily physical activity for 30 to 60  minutes, if you are able to do this.  2. Commitment to wise food choices. Aim for half of your  food intake to be vegetable and fruit, one quarter starchy foods, and one quarter protein. Try to eat on a regular schedule  3 meals per day, snacking between meals should be limited to vegetables or fruits or small portions of nuts. 64 ounces of water per day is generally recommended, unless you have specific health conditions, like heart failure or kidney failure where you will need to limit fluid intake.  3. Commitment to sufficient and a  good quality of physical and mental rest daily, generally between 6 to 8 hours per day.  WITH PERSISTANCE AND PERSEVERANCE, THE IMPOSSIBLE , BECOMES THE NORM! Thanks for choosing [facility], we consider it a privelige to serve you.

## 2015-09-17 NOTE — Progress Notes (Signed)
   Subjective:    Patient ID: Samuel Ryan, male    DOB: [birth year], 81 y.o.   MRN: [id number]  HPI    Review of Systems     Objective:   Physical Exam        Assessment & Plan:

## 2015-09-18 DIAGNOSIS — Z0289 Encounter for other administrative examinations: Secondary | ICD-10-CM

## 2015-09-18 LAB — MICROALBUMIN / CREATININE URINE RATIO
Creatinine, Urine: 127 mg/dL (ref 20–370)
MICROALB/CREAT RATIO: 24 ug/mg{creat} (ref <30)
Microalb, Ur: 3.1 mg/dL

## 2015-09-19 ENCOUNTER — Telehealth: Payer: Self-pay | Admitting: *Deleted

## 2015-09-19 NOTE — Telephone Encounter (Signed)
Patient is scheduled for Wednesday 10/01/15 at 2:30 with Dr. Alyson Ingles

## 2015-09-22 DIAGNOSIS — Z0100 Encounter for examination of eyes and vision without abnormal findings: Secondary | ICD-10-CM | POA: Diagnosis not present

## 2015-09-22 LAB — HM DIABETES EYE EXAM

## 2015-10-01 ENCOUNTER — Ambulatory Visit (INDEPENDENT_AMBULATORY_CARE_PROVIDER_SITE_OTHER): Payer: Commercial Managed Care - HMO | Admitting: Urology

## 2015-10-01 DIAGNOSIS — N3281 Overactive bladder: Secondary | ICD-10-CM

## 2015-10-01 DIAGNOSIS — N401 Enlarged prostate with lower urinary tract symptoms: Secondary | ICD-10-CM | POA: Diagnosis not present

## 2015-10-01 DIAGNOSIS — R351 Nocturia: Secondary | ICD-10-CM

## 2015-10-01 DIAGNOSIS — R3129 Other microscopic hematuria: Secondary | ICD-10-CM

## 2015-10-11 ENCOUNTER — Other Ambulatory Visit: Payer: Self-pay | Admitting: Family Medicine

## 2015-10-18 DIAGNOSIS — R05 Cough: Secondary | ICD-10-CM | POA: Insufficient documentation

## 2015-10-18 DIAGNOSIS — R053 Chronic cough: Secondary | ICD-10-CM | POA: Insufficient documentation

## 2015-10-18 NOTE — Assessment & Plan Note (Signed)
Home safety reviewed and ongoing use of assistive devices discussed

## 2015-10-18 NOTE — Progress Notes (Signed)
DEMONT DOCHERTY     MRN: [id number]      DOB: 1931-05-17   HPI Mr. Bayes is here for follow up and re-evaluation of chronic medical conditions, medication management and review of any available recent lab and radiology data.  Preventive health is updated, specifically  Cancer screening and Immunization.   Questions or concerns regarding consultations or procedures which the PT has had in the interim are  addressed. The PT denies any adverse reactions to current medications since the last visit.  C/o excess and uncontrolled cough for months, no fever or chills but disturbs quality of life. C/o excess urination at night in particular , frequent small amounts with poor stream ROS Denies recent fever or chills. Denies sinus pressure, nasal congestion, ear pain or sore throat.  Denies chest pains, palpitations and leg swelling Denies abdominal pain, nausea, vomiting,diarrhea or constipation.   e. Chronic  joint pain,  and limitation in mobility. Denies headaches, seizures, numbness, or tingling. Denies depression, anxiety or insomnia. Denies skin break down or rash.   PE  BP 134/84 mmHg  Pulse 84  Resp 16  Ht 5\' 10"  (1.778 m)  Wt 246 lb (111.585 kg)  BMI 35.30 kg/m2  SpO2 91%  Patient alert and oriented and in no cardiopulmonary distress.  HEENT: No facial asymmetry, EOMI,   oropharynx pink and moist.  Neck decreased ROM no JVD, no mass.  Chest: Decreased though adequate air entry, few crakles no wheezing   CVS: S1, S2 no murmurs, no S3.Regular rate.  ABD: Soft non tender.   Ext: No edema  MS: Decreased  ROM spine, shoulders, hips and knees.  Skin: Intact, no ulcerations or rash noted.  Psych: Good eye contact, normal affect. Memory intact not anxious or depressed appearing.  CNS: CN 2-12 intact, power,  normal throughout.no focal deficits noted.   Assessment & Plan   Urinary frequency Check UA if abn will treat and send for c/s prior to rept urology  eval  Chronic coughing Will treat symptoms, allergy based, no symptoms or sign of infection. Tessalon perles, prednisone and phenergan DM prescribed  Type 2 diabetes, uncontrolled, with renal manifestation Controlled, no change in medication Updated lab needed at/ before next visit.  Mr. Bibler is reminded of the importance of commitment to daily physical activity for 30 minutes or more, as able and the need to limit carbohydrate intake to 30 to 60 grams per meal to help with blood sugar control.   The need to take medication as prescribed, test blood sugar as directed, and to call between visits if there is a concern that blood sugar is uncontrolled is also discussed.   Mr. Purgason is reminded of the importance of daily foot exam, annual eye examination, and good blood sugar, blood pressure and cholesterol control.  Diabetic Labs Latest Ref Rng 09/17/2015 06/30/2015 06/02/2015 03/28/2015 02/13/2015  HbA1c <5.7 % - 8.2(H) - 8.7(H) -  Microalbumin Not estab mg/dL 3.1 - - - -  Micro/Creat Ratio <30 mcg/mg creat 24 - - - -  Chol 0 - 200 mg/dL - - - 158 -  HDL >=40 mg/dL - - - 48 -  Calc LDL 0 - 99 mg/dL - - - 91 -  Triglycerides <150 mg/dL - - - 97 -  Creatinine 0.70 - 1.11 mg/dL - 1.51(H) 1.40(H) 1.43(H) 1.66(H)   BP/Weight 09/17/2015 07/08/2015 06/17/2015 06/15/2015 06/02/2015 03/24/2015 [id number]  Systolic BP Q000111Q 0000000 [id number] Q000111Q 0000000 123456 [id number]  Diastolic BP 84 80 76  96 90 82 73  Wt. (Lbs) 246 243 240.12 254 254 247.12 264  BMI 35.3 34.87 34.45 34.44 34.44 35.46 35.8   Foot/eye exam completion dates Latest Ref Rng 09/22/2015 09/22/2015  Eye Exam No Retinopathy No Retinopathy No Retinopathy  Foot Form Completion - - -         Dementia of Alzheimer's type with behavioral disturbance Controlled, no change in medication   Depression due to dementia Controlled, no change in medication   At high risk for falls Home safety reviewed and ongoing use of assistive devices discussed

## 2015-10-18 NOTE — Assessment & Plan Note (Signed)
Controlled, no change in medication  

## 2015-10-18 NOTE — Assessment & Plan Note (Addendum)
Controlled, no change in medication Updated lab needed at/ before next visit.  Mr. Samuel Ryan is reminded of the importance of commitment to daily physical activity for 30 minutes or more, as able and the need to limit carbohydrate intake to 30 to 60 grams per meal to help with blood sugar control.   The need to take medication as prescribed, test blood sugar as directed, and to call between visits if there is a concern that blood sugar is uncontrolled is also discussed.   Mr. Samuel Ryan is reminded of the importance of daily foot exam, annual eye examination, and good blood sugar, blood pressure and cholesterol control.  Diabetic Labs Latest Ref Rng 09/17/2015 06/30/2015 06/02/2015 03/28/2015 02/13/2015  HbA1c <5.7 % - 8.2(H) - 8.7(H) -  Microalbumin Not estab mg/dL 3.1 - - - -  Micro/Creat Ratio <30 mcg/mg creat 24 - - - -  Chol 0 - 200 mg/dL - - - 158 -  HDL >=40 mg/dL - - - 48 -  Calc LDL 0 - 99 mg/dL - - - 91 -  Triglycerides <150 mg/dL - - - 97 -  Creatinine 0.70 - 1.11 mg/dL - 1.51(H) 1.40(H) 1.43(H) 1.66(H)   BP/Weight 09/17/2015 07/08/2015 06/17/2015 06/15/2015 06/02/2015 03/24/2015 [id number]  Systolic BP Q000111Q 0000000 [id number] Q000111Q 0000000 123456 [id number]  Diastolic BP 84 80 76 96 90 82 73  Wt. (Lbs) 246 243 240.12 254 254 247.12 264  BMI 35.3 34.87 34.45 34.44 34.44 35.46 35.8   Foot/eye exam completion dates Latest Ref Rng 09/22/2015 09/22/2015  Eye Exam No Retinopathy No Retinopathy No Retinopathy  Foot Form Completion - - -

## 2015-10-18 NOTE — Assessment & Plan Note (Signed)
Will treat symptoms, allergy based, no symptoms or sign of infection. Tessalon perles, prednisone and phenergan DM prescribed

## 2015-11-14 ENCOUNTER — Ambulatory Visit (INDEPENDENT_AMBULATORY_CARE_PROVIDER_SITE_OTHER): Payer: Commercial Managed Care - HMO | Admitting: Urology

## 2015-11-14 DIAGNOSIS — N3941 Urge incontinence: Secondary | ICD-10-CM | POA: Diagnosis not present

## 2015-11-14 DIAGNOSIS — R351 Nocturia: Secondary | ICD-10-CM | POA: Diagnosis not present

## 2015-11-14 DIAGNOSIS — N3281 Overactive bladder: Secondary | ICD-10-CM | POA: Diagnosis not present

## 2015-11-14 DIAGNOSIS — N401 Enlarged prostate with lower urinary tract symptoms: Secondary | ICD-10-CM

## 2015-11-15 ENCOUNTER — Emergency Department (HOSPITAL_COMMUNITY): Payer: Commercial Managed Care - HMO

## 2015-11-15 ENCOUNTER — Encounter (HOSPITAL_COMMUNITY): Payer: Self-pay | Admitting: Emergency Medicine

## 2015-11-15 ENCOUNTER — Emergency Department (HOSPITAL_COMMUNITY)
Admission: EM | Admit: 2015-11-15 | Discharge: 2015-11-15 | Disposition: A | Discharge status: Home / Self Care | Payer: Commercial Managed Care - HMO | Attending: Emergency Medicine | Admitting: Emergency Medicine

## 2015-11-15 DIAGNOSIS — Z7951 Long term (current) use of inhaled steroids: Secondary | ICD-10-CM | POA: Diagnosis not present

## 2015-11-15 DIAGNOSIS — Z8701 Personal history of pneumonia (recurrent): Secondary | ICD-10-CM | POA: Insufficient documentation

## 2015-11-15 DIAGNOSIS — Z86718 Personal history of other venous thrombosis and embolism: Secondary | ICD-10-CM | POA: Diagnosis not present

## 2015-11-15 DIAGNOSIS — F0390 Unspecified dementia without behavioral disturbance: Secondary | ICD-10-CM | POA: Insufficient documentation

## 2015-11-15 DIAGNOSIS — N50812 Left testicular pain: Secondary | ICD-10-CM | POA: Diagnosis not present

## 2015-11-15 DIAGNOSIS — N50811 Right testicular pain: Secondary | ICD-10-CM | POA: Diagnosis not present

## 2015-11-15 DIAGNOSIS — E782 Mixed hyperlipidemia: Secondary | ICD-10-CM | POA: Insufficient documentation

## 2015-11-15 DIAGNOSIS — K8020 Calculus of gallbladder without cholecystitis without obstruction: Secondary | ICD-10-CM | POA: Diagnosis not present

## 2015-11-15 DIAGNOSIS — I10 Essential (primary) hypertension: Secondary | ICD-10-CM | POA: Diagnosis not present

## 2015-11-15 DIAGNOSIS — Z79899 Other long term (current) drug therapy: Secondary | ICD-10-CM | POA: Insufficient documentation

## 2015-11-15 DIAGNOSIS — Z7984 Long term (current) use of oral hypoglycemic drugs: Secondary | ICD-10-CM | POA: Insufficient documentation

## 2015-11-15 DIAGNOSIS — R1030 Lower abdominal pain, unspecified: Secondary | ICD-10-CM

## 2015-11-15 DIAGNOSIS — Z8739 Personal history of other diseases of the musculoskeletal system and connective tissue: Secondary | ICD-10-CM | POA: Diagnosis not present

## 2015-11-15 DIAGNOSIS — E119 Type 2 diabetes mellitus without complications: Secondary | ICD-10-CM | POA: Diagnosis not present

## 2015-11-15 DIAGNOSIS — Z7952 Long term (current) use of systemic steroids: Secondary | ICD-10-CM | POA: Insufficient documentation

## 2015-11-15 DIAGNOSIS — R339 Retention of urine, unspecified: Secondary | ICD-10-CM | POA: Diagnosis not present

## 2015-11-15 DIAGNOSIS — Z86711 Personal history of pulmonary embolism: Secondary | ICD-10-CM | POA: Diagnosis not present

## 2015-11-15 DIAGNOSIS — N50819 Testicular pain, unspecified: Secondary | ICD-10-CM | POA: Diagnosis not present

## 2015-11-15 DIAGNOSIS — R102 Pelvic and perineal pain: Secondary | ICD-10-CM | POA: Diagnosis not present

## 2015-11-15 LAB — CBC WITH DIFFERENTIAL/PLATELET
BASOS ABS: 0 10*3/uL (ref 0.0–0.1)
Basophils Relative: 0 %
Eosinophils Absolute: 0.2 10*3/uL (ref 0.0–0.7)
Eosinophils Relative: 4 %
HEMATOCRIT: 35.8 % — AB (ref 39.0–52.0)
HEMOGLOBIN: 12 g/dL — AB (ref 13.0–17.0)
LYMPHS PCT: 33 %
Lymphs Abs: 1.4 10*3/uL (ref 0.7–4.0)
MCH: 30.7 pg (ref 26.0–34.0)
MCHC: 33.5 g/dL (ref 30.0–36.0)
MCV: 91.6 fL (ref 78.0–100.0)
MONO ABS: 0.4 10*3/uL (ref 0.1–1.0)
Monocytes Relative: 10 %
NEUTROS ABS: 2.3 10*3/uL (ref 1.7–7.7)
NEUTROS PCT: 53 %
Platelets: 158 10*3/uL (ref 150–400)
RBC: 3.91 MIL/uL — AB (ref 4.22–5.81)
RDW: 13.7 % (ref 11.5–15.5)
WBC: 4.3 10*3/uL (ref 4.0–10.5)

## 2015-11-15 LAB — URINALYSIS, ROUTINE W REFLEX MICROSCOPIC
Bilirubin Urine: NEGATIVE
Glucose, UA: NEGATIVE mg/dL
Ketones, ur: NEGATIVE mg/dL
Leukocytes, UA: NEGATIVE
Nitrite: NEGATIVE
Protein, ur: NEGATIVE mg/dL
Specific Gravity, Urine: 1.01 (ref 1.005–1.030)
pH: 6 (ref 5.0–8.0)

## 2015-11-15 LAB — URINE MICROSCOPIC-ADD ON

## 2015-11-15 LAB — COMPREHENSIVE METABOLIC PANEL
ALT: 11 U/L — AB (ref 17–63)
AST: 15 U/L (ref 15–41)
Albumin: 3.6 g/dL (ref 3.5–5.0)
Alkaline Phosphatase: 62 U/L (ref 38–126)
Anion gap: 9 (ref 5–15)
BILIRUBIN TOTAL: 0.8 mg/dL (ref 0.3–1.2)
BUN: 17 mg/dL (ref 6–20)
CO2: 25 mmol/L (ref 22–32)
CREATININE: 1.49 mg/dL — AB (ref 0.61–1.24)
Calcium: 8.8 mg/dL — ABNORMAL LOW (ref 8.9–10.3)
Chloride: 105 mmol/L (ref 101–111)
GFR calc Af Amer: 49 mL/min — ABNORMAL LOW (ref >60)
GFR, EST NON AFRICAN AMERICAN: 42 mL/min — AB (ref >60)
Glucose, Bld: 137 mg/dL — ABNORMAL HIGH (ref 65–99)
Potassium: 4.2 mmol/L (ref 3.5–5.1)
Sodium: 139 mmol/L (ref 135–145)
TOTAL PROTEIN: 7.3 g/dL (ref 6.5–8.1)

## 2015-11-15 LAB — POC OCCULT BLOOD, ED: Fecal Occult Bld: NEGATIVE

## 2015-11-15 LAB — LIPASE, BLOOD: Lipase: 35 U/L (ref 11–51)

## 2015-11-15 MED ORDER — HYDROCODONE-ACETAMINOPHEN 5-325 MG PO TABS
1.0000 | ORAL_TABLET | Freq: Once | ORAL | Status: AC
  Administered 2015-11-15: 1 via ORAL
  Filled 2015-11-15: qty 1

## 2015-11-15 NOTE — ED Notes (Signed)
Patient drinking ginger ale without problems.

## 2015-11-15 NOTE — ED Notes (Signed)
Patient c/o pelvic pressure with urinary retention. Per patient only able to void small amounts at a time. Patient also reports slight pain with urination. Per family patient recently seen his urologist and was told "he had a smaller than normal bladder."

## 2015-11-15 NOTE — ED Notes (Signed)
Foley catheter irrigated without any difficulty. Bladder scan revealed 67 ml of urine. Emptied 500 ml of urine out of foley bag.

## 2015-11-15 NOTE — ED Notes (Signed)
Duplicate order for foley catheter discontinued.

## 2015-11-15 NOTE — ED Notes (Signed)
Leg bag applied, instructions given of draining bag. D/c papers and instructions reviewed. Patient and family verbalized understanding. Patient d/c via wheelchair.

## 2015-11-15 NOTE — Discharge Instructions (Signed)
Acute Urinary Retention, Male Follow up with the urologist. Return to the ED if you develop new or worsening symptoms. Acute urinary retention is the temporary inability to urinate. This is a common problem in older men. As men age their prostates become larger and block the flow of urine from the bladder. This is usually a problem that has come on gradually.  HOME CARE INSTRUCTIONS If you are sent home with a Foley catheter and a drainage system, you will need to discuss the best course of action with your health care provider. While the catheter is in, maintain a good intake of fluids. Keep the drainage bag emptied and lower than your catheter. This is so that contaminated urine will not flow back into your bladder, which could lead to a urinary tract infection. There are two main types of drainage bags. One is a large bag that usually is used at night. It has a good capacity that will allow you to sleep through the night without having to empty it. The second type is called a leg bag. It has a smaller capacity, so it needs to be emptied more frequently. However, the main advantage is that it can be attached by a leg strap and can go underneath your clothing, allowing you the freedom to move about or leave your home. Only take over-the-counter or prescription medicines for pain, discomfort, or fever as directed by your health care provider.  SEEK MEDICAL CARE IF:  You develop a low-grade fever.  You experience spasms or leakage of urine with the spasms. SEEK IMMEDIATE MEDICAL CARE IF:   You develop chills or fever.  Your catheter stops draining urine.  Your catheter falls out.  You start to develop increased bleeding that does not respond to rest and increased fluid intake. MAKE SURE YOU:  Understand these instructions.  Will watch your condition.  Will get help right away if you are not doing well or get worse.   This information is not intended to replace advice given to you by your  health care provider. Make sure you discuss any questions you have with your health care provider.   Document Released: 02/21/2001 Document Revised: 04/01/2015 Document Reviewed: 04/26/2013 Elsevier Interactive Patient Education [employer].

## 2015-11-15 NOTE — ED Provider Notes (Signed)
CSN: [id number]     Arrival date & time 11/15/15  [id number] History  By signing my name below, I, [facility], attest that this documentation has been prepared under the direction and in the presence of Ezequiel Essex, MD. Electronically Signed: Virgel Bouquet, ED Scribe. 11/15/2015. 9:04 AM.     Chief Complaint  Patient presents with  . Urinary Retention   The history is provided by the patient. The history is limited by the condition of the patient. No language interpreter was used.    HPI Comments: Samuel Ryan is a 81 y.o. male with hx of diverticulitis and dementia who presents to the Emergency Department complaining of moderate, intermittent chronic pelvic pain, worse this morning. Patient endorses urinary retention and increased frequency of urination but notes not different from baseline. He reports that pain occasional abdominal pain but notes not present with with current symptoms. Per patient, he has an hx of diverticulitis but states current symptoms are dissimilar. Patient has been seen by his PCP where he was diagnosed with a small bladder but no prostate enlargement. He denies hx of HTN and DM and does not currently take coumadin. Patient denies vomiting, fever, constipation, blood in stool, back pain, CP, bowel incontinence, dizziness, and light-headness.  HPI limited by patient's dementia.  Past Medical History  Diagnosis Date  . Type 2 diabetes mellitus ([place])   . Mixed hyperlipidemia   . Essential hypertension, benign   . Dementia   . BPH (benign prostatic hypertrophy)   . Pulmonary embolism ([place])     Diagnosed 7/12  . Deep venous thrombosis ([facility])     Left leg, diagnosed 7/12  . Osteoarthritis   . Diverticulitis   . Acute cholecystitis 12/12/2012 to 12/19/2012    drainage tube placed, hosptalizred x 1 week, then nursing home  . Pneumonia    Past Surgical History  Procedure Laterality Date  . Neck fusion    . Carpal tunnel release    . Right total knee  replacement  04/23/2011  . Colonoscopy  08/31/2012    Procedure: COLONOSCOPY;  Surgeon: Rogene Houston, MD;  Location: [facility];  Service: Endoscopy;  Laterality: N/A;  830  . Eye surgery      bilateral cataract  . Hernia repair      umbilical  . Spine surgery      neck fusion   Family History  Problem Relation Age of Onset  . Cancer Mother     Stomach  . Diabetes Sister   . Hypertension Sister   . Diabetes Brother   . Cancer Brother     Gallbladder   Social History  Substance Use Topics  . Smoking status: Never Smoker   . Smokeless tobacco: Never Used  . Alcohol Use: No    Review of Systems  Constitutional: Negative for fever.  Cardiovascular: Negative for chest pain.  Gastrointestinal: Negative for vomiting, constipation and blood in stool.       Neg bowel incontinence. Positive for pelvic pain.  Genitourinary: Positive for frequency and decreased urine volume.  Musculoskeletal: Negative for back pain.  Neurological: Negative for dizziness and light-headedness.      Allergies  Vancomycin and Zosyn  Home Medications   Prior to Admission medications   Medication Sig Start Date End Date Taking? Authorizing Provider  acetaminophen (TYLENOL) 500 MG tablet Take 1,000 mg by mouth every 6 (six) hours as needed for moderate pain.   Yes Historical Provider, MD  benazepril (LOTENSIN) 5 MG tablet  TAKE 1 TABLET EVERY DAY 08/20/15  Yes [name], MD  clobetasol cream (TEMOVATE) [id number] % Apply 1 application topically 2 (two) times daily. Patient taking differently: Apply 1 application topically 2 (two) times daily as needed (rash).  03/18/15  Yes [name], MD  donepezil (ARICEPT) 10 MG tablet TAKE 1 TABLET AT BEDTIME 08/20/15  Yes [name], MD  ferrous sulfate 325 (65 FE) MG tablet TAKE 1 TABLET TWICE DAILY 05/23/15  Yes [name], MD  fluticasone [facility]) 50 MCG/ACT nasal spray Place 2 sprays into both nostrils daily. 01/31/14  Yes [name], MD  glipiZIDE (GLUCOTROL XL) 10 MG 24 hr tablet TAKE TWO TABLETS EVERY MORNING AT BREAKFAST (DOSE INCREASE)  08/20/15  Yes [name], MD  linagliptin (TRADJENTA) 5 MG TABS tablet Take 1 tablet (5 mg total) by mouth daily. 04/01/15  Yes [name], MD  lovastatin (MEVACOR) 40 MG tablet TAKE 1 TABLET (40 MG TOTAL) BY MOUTH AT BEDTIME. 08/20/15  Yes [name], MD  mirtazapine (REMERON) 7.5 MG tablet Take 1 tablet (7.5 mg total) by mouth at bedtime. 12/30/14  Yes [name], MD  omeprazole (PRILOSEC) 40 MG capsule TAKE 1 CAPSULE EVERY DAY 08/20/15  Yes [name], MD  Polyethylene Glycol 400 (BLINK TEARS OP) Apply 1 drop to eye daily as needed (dry eyes).   Yes Historical Provider, MD  sertraline (ZOLOFT) 50 MG tablet TAKE 1 TABLET EVERY DAY 08/20/15  Yes [name], MD  tamsulosin (FLOMAX) 0.4 MG CAPS capsule TAKE 1 CAPSULE (0.4 MG TOTAL) BY MOUTH DAILY. 08/20/15  Yes [name], MD  traMADol (ULTRAM) 50 MG tablet Take 1 tablet (50 mg total) by mouth 2 (two) times daily. 09/16/15  Yes [name], MD  Vitamin D, Ergocalciferol, (DRISDOL) 50000 UNITS CAPS capsule TAKE 1 CAPSULE EVERY 7 (SEVEN) DAYS. Patient taking differently: TAKE 1 CAPSULE EVERY 7 (SEVEN) DAYS.  (TAKES ON FRIDAYS.) 10/13/15  Yes [name], MD  benzonatate (TESSALON) 100 MG capsule Take 1 capsule (100 mg total) by mouth 2 (two) times daily. Patient not taking: Reported on 11/15/2015 08/15/15   [name], MD  benzonatate (TESSALON) 100 MG capsule Take 1 capsule (100 mg total) by mouth 2 (two) times daily as needed for cough. Patient not taking: Reported on 11/15/2015 09/17/15   [name], MD  glucose blood test strip Use as instructed with once daily testing TrueTest Glucose Test Strips 08/07/15 08/06/16  [name], MD  predniSONE (DELTASONE) 5 MG tablet Take 1 tablet (5 mg total) by mouth 2 (two) times daily with a meal. Patient not  taking: Reported on 11/15/2015 09/17/15   [name], MD  promethazine-dextromethorphan (PROMETHAZINE-DM) 6.25-15 MG/5ML syrup Take 5 mLs by mouth at bedtime as needed for cough. Patient not taking: Reported on 11/15/2015 09/17/15   [name], MD   BP 167/77 mmHg  Pulse 59  Temp(Src) 97 F (36.1 C) (Oral)  Resp 18  Ht 5\' 10"  (1.778 m)  Wt 244 lb (110.678 kg)  BMI 35.01 kg/m2  SpO2 94% Physical Exam  Constitutional: He is oriented to person, place, and time. He appears well-developed and well-nourished. No distress.  Uncomfortable.  HENT:  Head: Normocephalic and atraumatic.  Mouth/Throat: Oropharynx is clear and moist. No oropharyngeal exudate.  Eyes: Conjunctivae and EOM are normal. Pupils are equal, round, and reactive to light.  Neck: Normal range of motion. Neck supple.  No meningismus.  Cardiovascular: Normal rate, regular rhythm, normal heart sounds and intact distal pulses.   No murmur heard. Pulmonary/Chest: Effort normal and breath sounds normal. No respiratory distress.  Abdominal: Soft. There is no tenderness. There is no rebound, no guarding and no CVA tenderness.  No CVA tenderness.  Genitourinary:  Suprapubic tenderness and bilateral testicular tenderness. Uncircumcised. Foreskin easily retractable. No fecal impaction. Prostate nontender and firm  Musculoskeletal: Normal range of motion. He exhibits no edema or tenderness.  Neurological: He is alert and oriented to person, place, and time. No cranial nerve deficit. He exhibits normal muscle tone. Coordination normal.  No ataxia on finger to nose bilaterally. No pronator drift. 5/5 strength throughout. CN 2-12 intact.Equal grip strength. Sensation intact.   Skin: Skin is warm.  Psychiatric: He has a normal mood and affect. His behavior is normal.  Nursing note and vitals reviewed.   ED Course  Procedures  DIAGNOSTIC STUDIES: Oxygen Saturation is 96% on RA, adequate by my interpretation.     COORDINATION OF CARE: 8:37 AM Discussed treatment plan with pt at bedside and pt agreed to plan.   Labs Review Labs Reviewed  URINALYSIS, ROUTINE W REFLEX MICROSCOPIC (NOT AT [facility]) - Abnormal; Notable for the following:    APPearance HAZY (*)    Hgb urine dipstick SMALL (*)    All other components within normal limits  CBC WITH DIFFERENTIAL/PLATELET - Abnormal; Notable for the following:    RBC 3.91 (*)    Hemoglobin 12.0 (*)    HCT 35.8 (*)    All other components within normal limits  COMPREHENSIVE METABOLIC PANEL - Abnormal; Notable for the following:    Glucose, Bld 137 (*)    Creatinine, Ser 1.49 (*)    Calcium 8.8 (*)    ALT 11 (*)    GFR calc non Af Amer 42 (*)    GFR calc Af Amer 49 (*)    All other components within normal limits  URINE MICROSCOPIC-ADD ON - Abnormal; Notable for the following:    Squamous Epithelial / LPF 0-5 (*)    Bacteria, UA RARE (*)    All other components within normal limits  URINE CULTURE  LIPASE, BLOOD  POC OCCULT BLOOD, ED    Imaging Review Ct Abdomen Pelvis Wo Contrast  11/15/2015  CLINICAL DATA:  Pelvic pressure EXAM: CT ABDOMEN AND PELVIS WITHOUT CONTRAST TECHNIQUE: Multidetector CT imaging of the abdomen and pelvis was performed following the standard protocol without IV contrast. COMPARISON:  06/02/2015 FINDINGS: Lung bases are well aerated but demonstrate bilateral scarring this is stable from the prior study. The liver, spleen, adrenal glands and pancreas are within normal limits. The gallbladder is well distended and demonstrates multiple dependent gallstones stable from the prior exam. The kidneys are well visualized bilaterally without renal calculi or urinary tract obstructive changes. A dominant cyst is noted in the upper pole of the left kidney. This is stable from the prior study. The bladder is decompressed by Foley catheter. The prostate is enlarged in size. No pelvic mass lesion or sidewall abnormality is noted. Degenerative  changes of the lumbar spine are noted. Aortoiliac calcifications are seen. IMPRESSION: Enlarged prostate. Cholelithiasis without complicating factors. Electronically Signed   By: Inez Catalina M.D.   On: 11/15/2015 10:21   US Scrotum  11/15/2015  CLINICAL DATA:  Patient with testicular and pelvic pain/ pressure. EXAM: SCROTAL ULTRASOUND DOPPLER ULTRASOUND OF THE TESTICLES TECHNIQUE: Complete ultrasound examination of the testicles, epididymis, and other scrotal structures was  performed. Color and spectral Doppler ultrasound were also utilized to evaluate blood flow to the testicles. COMPARISON:  CT abdomen pelvis 06/02/2015; 11/15/2015. FINDINGS: Right testicle Measurements: 4.5 x 2.4 x 2.0 cm. Heterogeneous in echogenicity most compatible with atrophy. Left testicle Measurements: 4.6 x 2.9 x 3.4 cm. The majority of the left testicle is replaced by a large cyst which measures up to 4.6 cm. Right epididymis:  Small epididymal head cyst versus spermatocele. Left epididymis:  Small epididymal head cyst versus spermatocele. Hydrocele:  None visualized. Varicocele:  None visualized. Pulsed Doppler interrogation of both testes demonstrates normal low resistance arterial and venous waveforms bilaterally. IMPRESSION: Arterial and venous waveforms are demonstrated within the testicles bilaterally. No definite evidence to suggest torsion. The majority of the left testicle is replaced by a large simple appearing cyst. Heterogeneity of the right testicular parenchyma likely secondary to atrophy. Electronically Signed   By: Lovey Newcomer M.D.   On: 11/15/2015 10:41   [place] Art/ven Flow Abd Pelv Doppler  11/15/2015  CLINICAL DATA:  Patient with testicular and pelvic pain/ pressure. EXAM: SCROTAL ULTRASOUND DOPPLER ULTRASOUND OF THE TESTICLES TECHNIQUE: Complete ultrasound examination of the testicles, epididymis, and other scrotal structures was performed. Color and spectral Doppler ultrasound were also utilized to evaluate  blood flow to the testicles. COMPARISON:  CT abdomen pelvis 06/02/2015; 11/15/2015. FINDINGS: Right testicle Measurements: 4.5 x 2.4 x 2.0 cm. Heterogeneous in echogenicity most compatible with atrophy. Left testicle Measurements: 4.6 x 2.9 x 3.4 cm. The majority of the left testicle is replaced by a large cyst which measures up to 4.6 cm. Right epididymis:  Small epididymal head cyst versus spermatocele. Left epididymis:  Small epididymal head cyst versus spermatocele. Hydrocele:  None visualized. Varicocele:  None visualized. Pulsed Doppler interrogation of both testes demonstrates normal low resistance arterial and venous waveforms bilaterally. IMPRESSION: Arterial and venous waveforms are demonstrated within the testicles bilaterally. No definite evidence to suggest torsion. The majority of the left testicle is replaced by a large simple appearing cyst. Heterogeneity of the right testicular parenchyma likely secondary to atrophy. Electronically Signed   By: Lovey Newcomer M.D.   On: 11/15/2015 10:41   I have personally reviewed and evaluated these images and lab results as part of my medical decision-making.   EKG Interpretation None      MDM   Final diagnoses:  Testicular pain  Urinary retention   patient of pelvic pressure, urinary frequency and urgency since last night. No fever or vomiting. History of "small bladder" and BPH. Endorses bilateral testicular pain.  Bladder scan shows only 67 ml. Foley placed with >200 ml.  UA is not impressive for infection. Labs appear to be at baseline.  Testicles are normal on ultrasound. There is no torsion. CT scan shows enlarged prostate and cholelithiasis.  Patient complains of pain inside a Foley catheter which is different than previous pain. He's had 600 mL of urine in the catheter. Urine is clear and not impressive for infection.  Suspect symptoms due to urinary retention. Creatinine at baseline. Patient feeling better and tolerating by mouth.  Will leave Foley catheter in place. Urinalysis negative for infection. Follow-up with urology. Return precautions discussed.   I personally performed the services described in this documentation, which was scribed in my presence. The recorded information has been reviewed and is accurate.    Ezequiel Essex, MD 11/15/15 1434

## 2015-11-17 LAB — URINE CULTURE: Culture: NO GROWTH

## 2015-11-18 ENCOUNTER — Telehealth: Payer: Self-pay | Admitting: Family Medicine

## 2015-11-18 DIAGNOSIS — R351 Nocturia: Secondary | ICD-10-CM | POA: Diagnosis not present

## 2015-11-18 DIAGNOSIS — N401 Enlarged prostate with lower urinary tract symptoms: Secondary | ICD-10-CM | POA: Diagnosis not present

## 2015-11-18 DIAGNOSIS — N3281 Overactive bladder: Secondary | ICD-10-CM | POA: Diagnosis not present

## 2015-11-18 NOTE — Telephone Encounter (Signed)
Mr. Nissim is asking for a returned call from [facility]

## 2015-11-19 NOTE — Telephone Encounter (Signed)
Called and spoke with Samuel Ryan and he said his son had wanted to ask something but he didn;t know what it was so I called his son and left a message to call back if he had any questions

## 2015-11-21 DIAGNOSIS — N3941 Urge incontinence: Secondary | ICD-10-CM | POA: Diagnosis not present

## 2015-11-21 DIAGNOSIS — N3281 Overactive bladder: Secondary | ICD-10-CM | POA: Diagnosis not present

## 2015-11-21 DIAGNOSIS — R3915 Urgency of urination: Secondary | ICD-10-CM | POA: Diagnosis not present

## 2015-11-21 DIAGNOSIS — N401 Enlarged prostate with lower urinary tract symptoms: Secondary | ICD-10-CM | POA: Diagnosis not present

## 2015-11-25 ENCOUNTER — Other Ambulatory Visit: Payer: Self-pay | Admitting: Family Medicine

## 2015-11-25 ENCOUNTER — Ambulatory Visit (INDEPENDENT_AMBULATORY_CARE_PROVIDER_SITE_OTHER): Payer: Commercial Managed Care - HMO | Admitting: Urology

## 2015-11-25 DIAGNOSIS — N401 Enlarged prostate with lower urinary tract symptoms: Secondary | ICD-10-CM

## 2015-11-25 DIAGNOSIS — N3281 Overactive bladder: Secondary | ICD-10-CM | POA: Diagnosis not present

## 2015-11-25 DIAGNOSIS — N139 Obstructive and reflux uropathy, unspecified: Secondary | ICD-10-CM

## 2015-11-25 DIAGNOSIS — E785 Hyperlipidemia, unspecified: Secondary | ICD-10-CM | POA: Diagnosis not present

## 2015-11-25 DIAGNOSIS — D508 Other iron deficiency anemias: Secondary | ICD-10-CM | POA: Diagnosis not present

## 2015-11-25 DIAGNOSIS — E1165 Type 2 diabetes mellitus with hyperglycemia: Secondary | ICD-10-CM | POA: Diagnosis not present

## 2015-11-25 DIAGNOSIS — E1129 Type 2 diabetes mellitus with other diabetic kidney complication: Secondary | ICD-10-CM | POA: Diagnosis not present

## 2015-11-25 LAB — HEMOGLOBIN: HEMOGLOBIN: 11.2 g/dL — AB (ref 13.0–17.0)

## 2015-11-25 MED ORDER — TRAMADOL HCL 50 MG PO TABS: 50.0000 mg | ORAL_TABLET | Freq: Two times a day (BID) | ORAL | Status: DC

## 2015-11-25 NOTE — Telephone Encounter (Signed)
Refill sent.

## 2015-11-25 NOTE — Telephone Encounter (Signed)
Patients traMADol (ULTRAM) 50 MG tablet  Has ran out he is requesting another 90 day supply

## 2015-11-26 ENCOUNTER — Encounter: Payer: Self-pay | Admitting: Family Medicine

## 2015-11-26 LAB — COMPLETE METABOLIC PANEL WITH GFR
ALK PHOS: 66 U/L (ref 40–115)
ALT: 8 U/L — AB (ref 9–46)
AST: 13 U/L (ref 10–35)
Albumin: 3.3 g/dL — ABNORMAL LOW (ref 3.6–5.1)
BILIRUBIN TOTAL: 0.8 mg/dL (ref 0.2–1.2)
BUN: 19 mg/dL (ref 7–25)
CO2: 29 mmol/L (ref 20–31)
CREATININE: 1.58 mg/dL — AB (ref 0.70–1.11)
Calcium: 8.4 mg/dL — ABNORMAL LOW (ref 8.6–10.3)
Chloride: 104 mmol/L (ref 98–110)
GFR, Est African American: 46 mL/min — ABNORMAL LOW (ref >=60)
GFR, Est Non African American: 40 mL/min — ABNORMAL LOW (ref >=60)
Glucose, Bld: 135 mg/dL — ABNORMAL HIGH (ref 65–99)
Potassium: 4.7 mmol/L (ref 3.5–5.3)
Sodium: 140 mmol/L (ref 135–146)
TOTAL PROTEIN: 6.9 g/dL (ref 6.1–8.1)

## 2015-11-26 LAB — LIPID PANEL
CHOL/HDL RATIO: 3.1 ratio (ref <=5.0)
Cholesterol: 136 mg/dL (ref 125–200)
HDL: 44 mg/dL (ref >=40)
LDL CALC: 77 mg/dL (ref <130)
Triglycerides: 77 mg/dL (ref <150)
VLDL: 15 mg/dL (ref <30)

## 2015-11-26 LAB — HEMOGLOBIN A1C
Hgb A1c MFr Bld: 9.1 % — ABNORMAL HIGH (ref <5.7)
MEAN PLASMA GLUCOSE: 214 mg/dL — AB (ref <117)

## 2015-11-26 LAB — FERRITIN: Ferritin: 776 ng/mL — ABNORMAL HIGH (ref 22–322)

## 2015-11-26 LAB — IRON: Iron: 58 ug/dL (ref 50–180)

## 2015-11-27 ENCOUNTER — Encounter: Payer: Self-pay | Admitting: Family Medicine

## 2015-11-27 ENCOUNTER — Ambulatory Visit (INDEPENDENT_AMBULATORY_CARE_PROVIDER_SITE_OTHER): Payer: Commercial Managed Care - HMO | Admitting: Family Medicine

## 2015-11-27 VITALS — BP 122/72 | HR 64 | Resp 16 | Ht 70.0 in | Wt 244.0 lb

## 2015-11-27 DIAGNOSIS — R220 Localized swelling, mass and lump, head: Secondary | ICD-10-CM

## 2015-11-27 DIAGNOSIS — Z0000 Encounter for general adult medical examination without abnormal findings: Secondary | ICD-10-CM | POA: Insufficient documentation

## 2015-11-27 DIAGNOSIS — E1121 Type 2 diabetes mellitus with diabetic nephropathy: Secondary | ICD-10-CM

## 2015-11-27 DIAGNOSIS — M2669 Other specified disorders of temporomandibular joint: Secondary | ICD-10-CM

## 2015-11-27 DIAGNOSIS — E1165 Type 2 diabetes mellitus with hyperglycemia: Secondary | ICD-10-CM

## 2015-11-27 DIAGNOSIS — R258 Other abnormal involuntary movements: Secondary | ICD-10-CM

## 2015-11-27 DIAGNOSIS — R253 Fasciculation: Secondary | ICD-10-CM

## 2015-11-27 DIAGNOSIS — IMO0002 Reserved for concepts with insufficient information to code with codable children: Secondary | ICD-10-CM

## 2015-11-27 MED ORDER — DOXYCYCLINE HYCLATE 100 MG PO TABS: 100.0000 mg | ORAL_TABLET | Freq: Two times a day (BID) | ORAL | Status: DC

## 2015-11-27 MED ORDER — PEN NEEDLES 31G X 5 MM MISC: 1.0000 | Status: DC

## 2015-11-27 MED ORDER — INSULIN GLARGINE 100 UNIT/ML SOLOSTAR PEN
PEN_INJECTOR | SUBCUTANEOUS | Status: DC
Start: 2015-11-27 — End: 2016-08-14

## 2015-11-27 NOTE — Progress Notes (Signed)
Subjective:    Patient ID: Samuel Ryan, male    DOB: Jul 12, 1931, 81 y.o.   MRN: [id number]  HPI Preventive Screening-Counseling & Management   Patient present here today for a Medicare annual wellness visit.   Current Problems (verified)   Medications Prior to Visit Allergies (verified)   PAST HISTORY  Family History (verified)   Social History Widower x 1 year 2 children, retired from [employer]  Current exercise habits: No exercise currently   Dietary issues discussed: heart heathy diet, mostly fruits and vegetables and limit fried fatty foods    Cardiac risk factors:  Type 2 diabetes   Depression Screen  (Note: if answer to either of the following is "Yes", a more complete depression screening is indicated)   Over the past two weeks, have you felt down, depressed or hopeless? Sometimes he gets depressed when he's alone  Over the past two weeks, have you felt little interest or pleasure in doing things? No  Have you lost interest or pleasure in daily life? No  Do you often feel hopeless? No  Do you cry easily over simple problems? No   Activities of Daily Living  In your present state of health, do you have any difficulty performing the following activities?  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 wash offs, needs assistance Getting dressed?:yes Getting to the toilet?: no  Using the toilet?:No Moving around from place to place?:yes, uses walker to get around   Fall Risk Assessment In the past year have you fallen or had a near fall?:No Are you currently taking any medications that make you dizzy?:No   Hearing Difficulties: No Do you often ask people to speak up or repeat themselves?:No, but sometimes he does , which is  expected at his age Do you experience ringing or noises in your ears?:No Do you have difficulty understanding soft or whispered voices?:yes  Cognitive Testing  Alert? Yes Normal Appearance?Yes  Oriented to person? Yes Place? Yes  Time? Yes  Displays appropriate judgment?Yes  Can read the correct time from a watch face? yes Are you having problems remembering things? Yes, son states he forgets a lot, has established diagnosis of dementia for which he is being treated   Advanced Directives have been discussed with the patient?Yes, brochure given. [employer] in process   List the Names of Other Physician/Practitioners you currently use:  Dr Alyson Ingles (urology)    Indicate any recent Medical Services you may have received from other than [facility] providers in the past year (date may be approximate).   Assessment:    Annual Wellness Exam   Plan:      Medicare Attestation  I have personally reviewed:  The patient's medical and social history  Their use of alcohol, tobacco or illicit drugs  Their current medications and supplements  The patient's functional ability including ADLs,fall risks, home safety risks, cognitive, and hearing and visual impairment  Diet and physical activities  Evidence for depression or mood disorders  The patient's weight, height, BMI, and visual acuity have been recorded in the chart. I have made referrals, counseling, and provided education to the patient based on review of the above and I have provided the patient with a written personalized care plan for preventive services.  Review of Systems     Objective:   Physical Exam  BP 122/72 mmHg  Pulse 64  Resp 16  Ht 5\' 10"  (1.778 m)  Wt 244 lb (110.678 kg)  BMI 35.01 kg/m2  SpO2 91%  HEENT: facial asymetry, with tender right lower jaw swelling no erythema or warmth, max diameter approximately 4 cm  CMS: occasional twitch noted in upper extremity, infrequent. Tone and power normal in upper  extremities, sensation intact     Assessment & Plan:  Medicare annual wellness visit, subsequent Annual exam as documented. Counseling done  re healthy lifestyle involving commitment to 150 minutes exercise per week, heart healthy diet, and attaining healthy weight.The importance of adequate sleep also discussed. Regular seat belt use and home safety, is also discussed. Changes in health habits are decided on by the patient with goals and time frames  set for achieving them. Immunization and cancer screening needs are specifically addressed at this visit.   Jaw swelling Acute right jaw swelling, advised benadryl and short antibiotic course prescribed, palpable tender swelling in area of concern. Call in 3 to 5 days if not resolved for ENT evaluation  Muscle twitching Noted and initially discussed during December 2016 visit. Not disrupting ability to function currently and barely witnessed by me during a 30 minute visit, if worsens will get neurology eval  Type 2 diabetes, uncontrolled, with renal manifestation Deteriorated, neds to start insulin, and will ned to work with caregiver service to improve diet and in home education and checks by nursing will be beneficial. Weekly calls by in house LPN to be added also Samuel Ryan is reminded of the importance of commitment to daily physical activity for 30 minutes or more, as able and the need to limit carbohydrate intake to 30 to 60 grams per meal to help with blood sugar control.   The need to take medication as prescribed, test blood sugar as directed, and to call between visits if there is a concern that blood sugar is uncontrolled is also discussed.   Samuel Ryan is reminded of the importance of daily foot exam, annual eye examination, and good blood sugar, blood pressure and cholesterol control.  Diabetic Labs Latest Ref Rng 11/25/2015 11/15/2015 09/17/2015 06/30/2015 06/02/2015  HbA1c <5.7 % 9.1(H) - - 8.2(H) -  Microalbumin Not estab  mg/dL - - 3.1 - -  Micro/Creat Ratio <30 mcg/mg creat - - 24 - -  Chol 125 - 200 mg/dL 136 - - - -  HDL >=40 mg/dL 44 - - - -  Calc LDL <130 mg/dL 77 - - - -  Triglycerides <150 mg/dL 77 - - - -  Creatinine 0.70 - 1.11 mg/dL 1.58(H) 1.49(H) - 1.51(H) 1.40(H)   BP/Weight 11/27/2015 11/15/2015 09/17/2015 07/08/2015 06/17/2015 [id number]  Systolic BP 123XX123 [id number] 0000000  Diastolic BP 72 77 84 80 76 96 90  Wt. (Lbs) 244 244 246 243 240.12 254 254  BMI 35.01 35.01 35.3 34.87 34.45 34.44 34.44   Foot/eye exam completion dates Latest Ref Rng 09/22/2015 09/22/2015  Eye Exam No Retinopathy No Retinopathy No Retinopathy  Foot Form Completion - - -

## 2015-11-27 NOTE — Patient Instructions (Addendum)
F/u in 6 weeks with log, call weekly to nurse with blood sugar reports  You need to start insulin and STOP the sweets,  Cake , ice cream etc  Info will be provided for diabetic shoes    1 week of antibiotic sent for right jaw swelling if persists call, I will refer you to ENT  If twitching worsens and becomes disruptive,  neurologist may be able to prescribe medication to help with this  Test and write down blood sugar twice daily and return with log book in 8 weeks please

## 2015-12-01 ENCOUNTER — Encounter: Payer: Self-pay | Admitting: Family Medicine

## 2015-12-01 DIAGNOSIS — R253 Fasciculation: Secondary | ICD-10-CM | POA: Insufficient documentation

## 2015-12-01 NOTE — Assessment & Plan Note (Signed)
Noted and initially discussed during December 2016 visit. Not disrupting ability to function currently and barely witnessed by me during a 30 minute visit, if worsens will get neurology eval

## 2015-12-01 NOTE — Assessment & Plan Note (Signed)
Deteriorated, neds to start insulin, and will ned to work with caregiver service to improve diet and in home education and checks by nursing will be beneficial. Weekly calls by in house LPN to be added also Samuel Ryan is reminded of the importance of commitment to daily physical activity for 30 minutes or more, as able and the need to limit carbohydrate intake to 30 to 60 grams per meal to help with blood sugar control.   The need to take medication as prescribed, test blood sugar as directed, and to call between visits if there is a concern that blood sugar is uncontrolled is also discussed.   Samuel Ryan is reminded of the importance of daily foot exam, annual eye examination, and good blood sugar, blood pressure and cholesterol control.  Diabetic Labs Latest Ref Rng 11/25/2015 11/15/2015 09/17/2015 06/30/2015 06/02/2015  HbA1c <5.7 % 9.1(H) - - 8.2(H) -  Microalbumin Not estab mg/dL - - 3.1 - -  Micro/Creat Ratio <30 mcg/mg creat - - 24 - -  Chol 125 - 200 mg/dL 136 - - - -  HDL >=40 mg/dL 44 - - - -  Calc LDL <130 mg/dL 77 - - - -  Triglycerides <150 mg/dL 77 - - - -  Creatinine 0.70 - 1.11 mg/dL 1.58(H) 1.49(H) - 1.51(H) 1.40(H)   BP/Weight 11/27/2015 11/15/2015 09/17/2015 07/08/2015 06/17/2015 [id number]  Systolic BP 123XX123 [id number] 0000000 [id number] Q000111Q 0000000  Diastolic BP 72 77 84 80 76 96 90  Wt. (Lbs) 244 244 246 243 240.12 254 254  BMI 35.01 35.01 35.3 34.87 34.45 34.44 34.44   Foot/eye exam completion dates Latest Ref Rng 09/22/2015 09/22/2015  Eye Exam No Retinopathy No Retinopathy No Retinopathy  Foot Form Completion - - -

## 2015-12-01 NOTE — Assessment & Plan Note (Signed)
Acute right jaw swelling, advised benadryl and short antibiotic course prescribed, palpable tender swelling in area of concern. Call in 3 to 5 days if not resolved for ENT evaluation

## 2015-12-01 NOTE — Progress Notes (Signed)
   Subjective:    Patient ID: Samuel Ryan, male    DOB: 02/09/1931, 82 y.o.   MRN: [id number]  HPI    Review of Systems     Objective:   Physical Exam        Assessment & Plan:   

## 2015-12-01 NOTE — Assessment & Plan Note (Signed)

## 2015-12-02 DIAGNOSIS — E1129 Type 2 diabetes mellitus with other diabetic kidney complication: Secondary | ICD-10-CM | POA: Diagnosis not present

## 2015-12-03 ENCOUNTER — Emergency Department (HOSPITAL_COMMUNITY)
Admission: EM | Admit: 2015-12-03 | Discharge: 2015-12-03 | Disposition: A | Discharge status: Home / Self Care | Payer: Commercial Managed Care - HMO | Attending: Emergency Medicine | Admitting: Emergency Medicine

## 2015-12-03 ENCOUNTER — Encounter (HOSPITAL_COMMUNITY): Payer: Self-pay | Admitting: Emergency Medicine

## 2015-12-03 DIAGNOSIS — Z794 Long term (current) use of insulin: Secondary | ICD-10-CM | POA: Diagnosis not present

## 2015-12-03 DIAGNOSIS — Z7984 Long term (current) use of oral hypoglycemic drugs: Secondary | ICD-10-CM | POA: Diagnosis not present

## 2015-12-03 DIAGNOSIS — Z79899 Other long term (current) drug therapy: Secondary | ICD-10-CM | POA: Diagnosis not present

## 2015-12-03 DIAGNOSIS — R2242 Localized swelling, mass and lump, left lower limb: Secondary | ICD-10-CM | POA: Diagnosis present

## 2015-12-03 DIAGNOSIS — Z8719 Personal history of other diseases of the digestive system: Secondary | ICD-10-CM | POA: Insufficient documentation

## 2015-12-03 DIAGNOSIS — Z8701 Personal history of pneumonia (recurrent): Secondary | ICD-10-CM | POA: Insufficient documentation

## 2015-12-03 DIAGNOSIS — F0390 Unspecified dementia without behavioral disturbance: Secondary | ICD-10-CM | POA: Diagnosis not present

## 2015-12-03 DIAGNOSIS — Z8739 Personal history of other diseases of the musculoskeletal system and connective tissue: Secondary | ICD-10-CM | POA: Diagnosis not present

## 2015-12-03 DIAGNOSIS — Z792 Long term (current) use of antibiotics: Secondary | ICD-10-CM | POA: Insufficient documentation

## 2015-12-03 DIAGNOSIS — N50819 Testicular pain, unspecified: Secondary | ICD-10-CM

## 2015-12-03 DIAGNOSIS — N50812 Left testicular pain: Secondary | ICD-10-CM | POA: Diagnosis not present

## 2015-12-03 DIAGNOSIS — Z7952 Long term (current) use of systemic steroids: Secondary | ICD-10-CM | POA: Insufficient documentation

## 2015-12-03 DIAGNOSIS — E782 Mixed hyperlipidemia: Secondary | ICD-10-CM | POA: Diagnosis not present

## 2015-12-03 DIAGNOSIS — E119 Type 2 diabetes mellitus without complications: Secondary | ICD-10-CM | POA: Insufficient documentation

## 2015-12-03 DIAGNOSIS — I10 Essential (primary) hypertension: Secondary | ICD-10-CM | POA: Diagnosis not present

## 2015-12-03 DIAGNOSIS — Z86711 Personal history of pulmonary embolism: Secondary | ICD-10-CM | POA: Diagnosis not present

## 2015-12-03 DIAGNOSIS — Z7951 Long term (current) use of inhaled steroids: Secondary | ICD-10-CM | POA: Insufficient documentation

## 2015-12-03 DIAGNOSIS — N400 Enlarged prostate without lower urinary tract symptoms: Secondary | ICD-10-CM | POA: Insufficient documentation

## 2015-12-03 DIAGNOSIS — Z86718 Personal history of other venous thrombosis and embolism: Secondary | ICD-10-CM | POA: Insufficient documentation

## 2015-12-03 MED ORDER — LEVOFLOXACIN 500 MG PO TABS
500.0000 mg | ORAL_TABLET | Freq: Once | ORAL | Status: AC
  Administered 2015-12-03: 500 mg via ORAL
  Filled 2015-12-03: qty 1

## 2015-12-03 MED ORDER — OXYCODONE-ACETAMINOPHEN 5-325 MG PO TABS
1.0000 | ORAL_TABLET | Freq: Once | ORAL | Status: AC
  Administered 2015-12-03: 1 via ORAL
  Filled 2015-12-03: qty 1

## 2015-12-03 MED ORDER — LEVOFLOXACIN 500 MG PO TABS: 500.0000 mg | ORAL_TABLET | Freq: Every day | ORAL | Status: DC

## 2015-12-03 MED ORDER — HYDROCODONE-ACETAMINOPHEN 5-325 MG PO TABS: 1.0000 | ORAL_TABLET | Freq: Four times a day (QID) | ORAL | Status: DC | PRN

## 2015-12-03 NOTE — ED Notes (Signed)
Patient complaining of swelling to testicles starting tonight. States "they've given me a problem before but not that bad."

## 2015-12-03 NOTE — Discharge Instructions (Signed)
Follow-up with your urologist next week °

## 2015-12-03 NOTE — ED Provider Notes (Signed)
CSN: [id number]     Arrival date & time 12/03/15  1922 History  By signing my name below, I, Emmanuella Mensah, attest that this documentation has been prepared under the direction and in the presence of Milton Ferguson, MD. Electronically Signed: Judithann Sauger, ED Scribe. 12/03/2015. 10:14 PM.     Chief Complaint  Patient presents with  . Groin Swelling   Patient is a 82 y.o. male presenting with testicular pain. The history is provided by the patient. No language interpreter was used.  Testicle Pain This is a recurrent problem. The current episode started more than 2 days ago. The problem occurs every several days. The problem has been gradually worsening. Nothing aggravates the symptoms. Nothing relieves the symptoms. He has tried nothing for the symptoms.   HPI Comments: ROS and HPI limited by dementia.  Samuel Ryan is a 82 y.o. male with a hx of DM II and BPH who presents to the Emergency Department complaining of gradually worsening testicular swelling onset 2-3 days ago. Pt had a catheter last week but had it taken out 3 days ago due to a leakage. He states that he was seen on 11/15/15 for similar symptoms but adds that the pain and swelling was not this severe at that time. He explains that the pain resolved briefly after that visit. Pt's upcoming appointment with Urology is 12/18/15 for a prostate examination.  PCP: Dr. Moshe Cipro   Past Medical History  Diagnosis Date  . Type 2 diabetes mellitus ([place])   . Mixed hyperlipidemia   . Essential hypertension, benign   . Dementia   . BPH (benign prostatic hypertrophy)   . Pulmonary embolism ([place])     Diagnosed 7/12  . Deep venous thrombosis ([facility])     Left leg, diagnosed 7/12  . Osteoarthritis   . Diverticulitis   . Acute cholecystitis 12/12/2012 to 12/19/2012    drainage tube placed, hosptalizred x 1 week, then nursing home  . Pneumonia    Past Surgical History  Procedure Laterality Date  . Neck fusion    . Carpal tunnel release     . Right total knee replacement  04/23/2011  . Colonoscopy  08/31/2012    Procedure: COLONOSCOPY;  Surgeon: Rogene Houston, MD;  Location: [facility];  Service: Endoscopy;  Laterality: N/A;  830  . Eye surgery      bilateral cataract  . Hernia repair      umbilical  . Spine surgery      neck fusion   Family History  Problem Relation Age of Onset  . Cancer Mother     Stomach  . Diabetes Sister   . Hypertension Sister   . Diabetes Brother   . Cancer Brother     Gallbladder   Social History  Substance Use Topics  . Smoking status: Never Smoker   . Smokeless tobacco: Never Used  . Alcohol Use: No    Review of Systems  Unable to perform ROS: Dementia      Allergies  Vancomycin and Zosyn  Home Medications   Prior to Admission medications   Medication Sig Start Date End Date Taking? Authorizing Provider  acetaminophen (TYLENOL) 500 MG tablet Take 1,000 mg by mouth every 6 (six) hours as needed for moderate pain.    Historical Provider, MD  benazepril (LOTENSIN) 5 MG tablet TAKE 1 TABLET EVERY DAY 08/20/15   [name], MD  clobetasol cream (TEMOVATE) [id number] % Apply 1 application topically 2 (two) times daily. Patient  taking differently: Apply 1 application topically 2 (two) times daily as needed (rash).  03/18/15   [name], MD  donepezil (ARICEPT) 10 MG tablet TAKE 1 TABLET AT BEDTIME 08/20/15   [name], MD  doxycycline (VIBRA-TABS) 100 MG tablet Take 1 tablet (100 mg total) by mouth 2 (two) times daily. 11/27/15   [name], MD  ferrous sulfate 325 (65 FE) MG tablet TAKE 1 TABLET TWICE DAILY 05/23/15   [name], MD  fluticasone [facility]) 50 MCG/ACT nasal spray Place 2 sprays into both nostrils daily. 01/31/14   [name], MD  glipiZIDE (GLUCOTROL XL) 10 MG 24 hr tablet TAKE TWO TABLETS EVERY MORNING AT BREAKFAST (DOSE INCREASE)  08/20/15   [name], MD  glucose blood test strip Use as instructed with once daily  testing TrueTest Glucose Test Strips 08/07/15 08/06/16  [name], MD  Insulin Glargine (LANTUS) 100 UNIT/ML Solostar Pen 7 units every morning at breakfast 11/27/15   [name], MD  Insulin Pen Needle (PEN NEEDLES) 31G X 5 MM MISC 1 each by Does not apply route as directed. 11/27/15   [name], MD  linagliptin (TRADJENTA) 5 MG TABS tablet Take 1 tablet (5 mg total) by mouth daily. 04/01/15   [name], MD  lovastatin (MEVACOR) 40 MG tablet TAKE 1 TABLET (40 MG TOTAL) BY MOUTH AT BEDTIME. 08/20/15   [name], MD  mirtazapine (REMERON) 7.5 MG tablet Take 1 tablet (7.5 mg total) by mouth at bedtime. 12/30/14   [name], MD  omeprazole (PRILOSEC) 40 MG capsule TAKE 1 CAPSULE EVERY DAY 08/20/15   [name], MD  Polyethylene Glycol 400 (BLINK TEARS OP) Apply 1 drop to eye daily as needed (dry eyes).    Historical Provider, MD  sertraline (ZOLOFT) 50 MG tablet TAKE 1 TABLET EVERY DAY 08/20/15   [name], MD  tamsulosin (FLOMAX) 0.4 MG CAPS capsule TAKE 1 CAPSULE (0.4 MG TOTAL) BY MOUTH DAILY. 08/20/15   [name], MD  traMADol (ULTRAM) 50 MG tablet Take 1 tablet (50 mg total) by mouth 2 (two) times daily. 11/25/15   [name], MD  Vitamin D, Ergocalciferol, (DRISDOL) 50000 UNITS CAPS capsule TAKE 1 CAPSULE EVERY 7 (SEVEN) DAYS. Patient taking differently: TAKE 1 CAPSULE EVERY 7 (SEVEN) DAYS.  (TAKES ON FRIDAYS.) 10/13/15   [name], MD   BP 138/67 mmHg  Pulse 68  Temp(Src) 98.6 F (37 C) (Oral)  Resp 16  Ht 6' (1.829 m)  Wt 245 lb (111.131 kg)  BMI 33.22 kg/m2  SpO2 96% Physical Exam  Constitutional: He is oriented to person, place, and time. He appears well-developed.  HENT:  Head: Normocephalic.  Eyes: Conjunctivae are normal.  Neck: No tracheal deviation present.  Cardiovascular:  No murmur heard. Genitourinary:  Scrotum is moderately enlarged and there is tenderness to the left scrotum.    Musculoskeletal: Normal range of motion.  Neurological: He is oriented to person, place, and time.  Skin: Skin is warm.  Psychiatric: He has a normal mood and affect.    ED Course  Procedures (including critical care time) DIAGNOSTIC STUDIES: Oxygen Saturation is 96% on RA, normal by my interpretation.    COORDINATION OF CARE: 9:57 PM- Pt advised of plan for treatment and pt agrees.    Labs Review Labs Reviewed - No data to display  Imaging Review No results found.   Milton Ferguson, MD has personally  reviewed and evaluated these images and lab results as part of his medical decision-making.   EKG Interpretation None      MDM   Final diagnoses:  None   Patient has tender swollen scrotum. He had an ultrasound done last month that showed a large cyst on the left. He will be treated with Vicodin and Levaquin and will follow-up with his neurologist for further evaluation of the cyst   Milton Ferguson, MD 12/03/15 2320

## 2015-12-04 ENCOUNTER — Telehealth: Payer: Self-pay

## 2015-12-04 ENCOUNTER — Telehealth: Payer: Self-pay | Admitting: Family Medicine

## 2015-12-04 DIAGNOSIS — E1165 Type 2 diabetes mellitus with hyperglycemia: Principal | ICD-10-CM

## 2015-12-04 DIAGNOSIS — IMO0002 Reserved for concepts with insufficient information to code with codable children: Secondary | ICD-10-CM

## 2015-12-04 DIAGNOSIS — R220 Localized swelling, mass and lump, head: Secondary | ICD-10-CM

## 2015-12-04 DIAGNOSIS — E1121 Type 2 diabetes mellitus with diabetic nephropathy: Secondary | ICD-10-CM

## 2015-12-04 NOTE — Addendum Note (Signed)
Addended by: Eual Fines on: 12/04/2015 01:30 PM   Modules accepted: Orders

## 2015-12-04 NOTE — Telephone Encounter (Signed)
Son also called back to notify that patient is having jaw swelling on left side of face now that started today.  He has not filled abt from ED yet.  Please advise.

## 2015-12-04 NOTE — Telephone Encounter (Signed)
Patients son already has tramadol. Will continue to give and will take back to ER if pt gets worse. Will refer to endo and allergist per dr

## 2015-12-04 NOTE — Telephone Encounter (Signed)
Needs to fill antibiotic, and take it, keep appt with urologist/ make one for ED follow up. Tramadol is printed pls send , advise twice daily prescribed, if one only is enough just give one daily  Tramadol was just already prescribed on 12/27 , he should use that. If that is too strong then tylenol 500 mg one twice daily

## 2015-12-04 NOTE — Telephone Encounter (Signed)
For facial swelling, benadryl and take prescribed antibiotic, will also refer to allergist since now 2nd c/o facial swelling if son agrees pls do, if not let me know

## 2015-12-04 NOTE — Telephone Encounter (Signed)
Son reported his diabetic readings as well Fasting 124,114,103,110,102,90,65 Bedtime 147,112,123,133,188,95  Aware that his a1c is 9.1 and will be referred to Endo

## 2015-12-05 ENCOUNTER — Telehealth: Payer: Self-pay

## 2015-12-05 ENCOUNTER — Telehealth: Payer: Self-pay | Admitting: Family Medicine

## 2015-12-05 NOTE — Telephone Encounter (Signed)
Son aware.

## 2015-12-05 NOTE — Telephone Encounter (Signed)
Hold lantus today is OK

## 2015-12-05 NOTE — Telephone Encounter (Signed)
SPOKE WITH PATIENT AND HE IS AWARE THAT WE WILL CALL HIM BACK WITH A RESPONSE

## 2015-12-12 ENCOUNTER — Telehealth: Payer: Self-pay

## 2015-12-12 NOTE — Telephone Encounter (Signed)
looks GREAT, review if bedtime under 140 take snack ,ike 1 o peanurt butter cracker, dont want his  Fasting under 80

## 2015-12-12 NOTE — Telephone Encounter (Signed)
Update:: Fasting 79,84,99,99,103,75,89,96 Bedtime 190,200,194,182,184,165,134  Has been cutting back on sweets and trying to eat smaller portions. Has appt with Nida in February.

## 2015-12-18 DIAGNOSIS — R3915 Urgency of urination: Secondary | ICD-10-CM | POA: Diagnosis not present

## 2015-12-18 DIAGNOSIS — N3941 Urge incontinence: Secondary | ICD-10-CM | POA: Diagnosis not present

## 2015-12-18 DIAGNOSIS — Z0000 Encounter for general adult medical examination without abnormal findings: Secondary | ICD-10-CM | POA: Diagnosis not present

## 2016-01-02 ENCOUNTER — Ambulatory Visit: Payer: Self-pay | Admitting: "Endocrinology

## 2016-01-05 ENCOUNTER — Ambulatory Visit: Payer: Self-pay | Admitting: "Endocrinology

## 2016-01-06 ENCOUNTER — Other Ambulatory Visit: Payer: Self-pay | Admitting: Family Medicine

## 2016-01-12 ENCOUNTER — Telehealth: Payer: Self-pay

## 2016-01-12 NOTE — Telephone Encounter (Signed)
FASTING- [id number]   BEDTIME- [id number]  Advised readings looked very good and was within goal ranges. Continue current dose of medications and follow up next week and continue to watch carbs and sweets. (cancelled early Feb appt with Nida because patient was doing very well with insulin and sugars were within goal) will continue to monitor and see if next a1c improves

## 2016-01-16 ENCOUNTER — Telehealth: Payer: Self-pay

## 2016-01-16 DIAGNOSIS — Z0000 Encounter for general adult medical examination without abnormal findings: Secondary | ICD-10-CM | POA: Diagnosis not present

## 2016-01-16 DIAGNOSIS — N401 Enlarged prostate with lower urinary tract symptoms: Secondary | ICD-10-CM | POA: Diagnosis not present

## 2016-01-16 DIAGNOSIS — R3915 Urgency of urination: Secondary | ICD-10-CM | POA: Diagnosis not present

## 2016-01-16 DIAGNOSIS — R351 Nocturia: Secondary | ICD-10-CM | POA: Diagnosis not present

## 2016-01-16 NOTE — Telephone Encounter (Signed)
FASTING- 85,78,75,80,77,97,86 Bedtime- 115,170,143,163,169,158  Advised readings in the 70's were considered a little low and not to skip supper and try eating a light snack before bedtime such as a peanut butter crackers to see if am sugars would increase slightly

## 2016-01-23 ENCOUNTER — Encounter: Payer: Self-pay | Admitting: Family Medicine

## 2016-01-23 ENCOUNTER — Ambulatory Visit (INDEPENDENT_AMBULATORY_CARE_PROVIDER_SITE_OTHER): Payer: Commercial Managed Care - HMO | Admitting: Family Medicine

## 2016-01-23 VITALS — BP 128/76 | HR 78 | Resp 16 | Ht 72.0 in | Wt 246.0 lb

## 2016-01-23 DIAGNOSIS — G309 Alzheimer's disease, unspecified: Secondary | ICD-10-CM | POA: Diagnosis not present

## 2016-01-23 DIAGNOSIS — IMO0002 Reserved for concepts with insufficient information to code with codable children: Secondary | ICD-10-CM

## 2016-01-23 DIAGNOSIS — E1165 Type 2 diabetes mellitus with hyperglycemia: Secondary | ICD-10-CM

## 2016-01-23 DIAGNOSIS — E1121 Type 2 diabetes mellitus with diabetic nephropathy: Secondary | ICD-10-CM | POA: Diagnosis not present

## 2016-01-23 DIAGNOSIS — J3089 Other allergic rhinitis: Secondary | ICD-10-CM

## 2016-01-23 DIAGNOSIS — F0280 Dementia in other diseases classified elsewhere without behavioral disturbance: Secondary | ICD-10-CM

## 2016-01-23 NOTE — Progress Notes (Signed)
   Subjective:    Patient ID: Samuel Ryan, male    DOB: 05-09-1931, 82 y.o.   MRN: [id number]  HPI   Samuel Ryan     MRN: [id number]      DOB: Apr 25, 1931   HPI Samuel Ryan is here for follow up and re-evaluation of uncontrolled diabetes, has his log , he is accompanied by his son/ caregiver, report is excellent, he is recently started  On insulin.cent lab and radiology data.  Preventive health is updated, specifically  Cancer screening and Immunization.   Questions or concerns regarding consultations or procedures which the PT has had in the interim are  addressed. The PT denies any adverse reactions to current medications since the last visit.  Denies polyuria, polydipsia, blurred vision , or hypoglycemic episodes.  There are no new concerns.  There are no specific complaints   ROS Denies recent fever or chills. Denies sinus pressure, nasal congestion, ear pain or sore throat. Denies chest congestion, productive cough or wheezing. Denies chest pains, palpitations and leg swelling Denies abdominal pain, nausea, vomiting,diarrhea or constipation.   Denies dysuria, frequency, hesitancy or incontinence. Denies uncontrolled joint pain, swelling and limitation in mobility. Denies headaches, seizures, numbness, or tingling. Denies depression, anxiety or insomnia. Denies skin break down or rash.   PE  BP 128/76 mmHg  Pulse 78  Resp 16  Ht 6' (1.829 m)  Wt 246 lb (111.585 kg)  BMI 33.36 kg/m2  SpO2 91%  Patient alert and oriented and in no cardiopulmonary distress.  HEENT: No facial asymmetry, EOMI,   oropharynx pink and moist.  Neck supple no JVD, no mass.  Chest: Clear to auscultation bilaterally.  CVS: S1, S2 no murmurs, no S3.Regular rate.  ABD: Soft non tender.   Ext: No edema  MS: Decreased  ROM spine, shoulders, hips and knees.  Skin: Intact, no ulcerations or rash noted.  Psych: Good eye contact, normal affect. Memory impaired not anxious or depressed  appearing.  CNS: CN 2-12 intact, power,  normal throughout.no focal deficits noted.   Assessment & Plan   Type 2 diabetes, uncontrolled, with renal manifestation Marked improvement based on log that patient has with him, actually advised to add a bedtime snack if bedtime sugar under 160, as his morning sugar has fallen as low as 60 on approx 3 occasions in a 5 week span Danger of , and warning signs of hypoglycemia are reviewed with his son/ caregiver Updated lab needed at/ before next visit.   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

## 2016-01-23 NOTE — Patient Instructions (Addendum)
Annual physical exam first week in April, keep calling in blood sugars weekly to nurse  No change in medication  Goal for morning sugar is 100 to 140, bedtime around 180 (since morning sugars are low) Bedtime sugar if less than 180, take one or two peanut butter crackers  HBA1C, chem 7 and EGFR March 23 or after  Keep up great work, NO ICE CEAM, OR SODAS.  Keep GOOD HUMOUR    

## 2016-01-24 NOTE — Assessment & Plan Note (Signed)
Marked improvement based on log that patient has with him, actually advised to add a bedtime snack if bedtime sugar under 160, as his morning sugar has fallen as low as 60 on approx 3 occasions in a 5 week span Danger of , and warning signs of hypoglycemia are reviewed with his son/ caregiver Updated lab needed at/ before next visit.

## 2016-01-24 NOTE — Assessment & Plan Note (Signed)
Controlled, no change in medication  

## 2016-01-24 NOTE — Assessment & Plan Note (Signed)
Currently asymptomatic off medication, however will use as needed

## 2016-02-07 ENCOUNTER — Other Ambulatory Visit: Payer: Self-pay | Admitting: Family Medicine

## 2016-02-09 ENCOUNTER — Telehealth: Payer: Self-pay

## 2016-02-09 ENCOUNTER — Other Ambulatory Visit: Payer: Self-pay

## 2016-02-09 DIAGNOSIS — J309 Allergic rhinitis, unspecified: Secondary | ICD-10-CM

## 2016-02-09 MED ORDER — VITAMIN D (ERGOCALCIFEROL) 1.25 MG (50000 UNIT) PO CAPS: ORAL_CAPSULE | ORAL | Status: DC

## 2016-02-09 MED ORDER — DONEPEZIL HCL 10 MG PO TABS: 10.0000 mg | ORAL_TABLET | Freq: Every day | ORAL | Status: DC

## 2016-02-09 MED ORDER — BENAZEPRIL HCL 5 MG PO TABS: 5.0000 mg | ORAL_TABLET | Freq: Every day | ORAL | Status: DC

## 2016-02-09 MED ORDER — TRAMADOL HCL 50 MG PO TABS: 50.0000 mg | ORAL_TABLET | Freq: Two times a day (BID) | ORAL | Status: DC

## 2016-02-09 MED ORDER — TAMSULOSIN HCL 0.4 MG PO CAPS: ORAL_CAPSULE | ORAL | Status: DC

## 2016-02-09 MED ORDER — SERTRALINE HCL 50 MG PO TABS: 50.0000 mg | ORAL_TABLET | Freq: Every day | ORAL | Status: DC

## 2016-02-09 MED ORDER — FLUTICASONE PROPIONATE 50 MCG/ACT NA SUSP: 2.0000 | Freq: Every day | NASAL | Status: DC

## 2016-02-09 MED ORDER — GLIPIZIDE ER 10 MG PO TB24: ORAL_TABLET | ORAL | Status: DC

## 2016-02-09 MED ORDER — OMEPRAZOLE 40 MG PO CPDR: 40.0000 mg | DELAYED_RELEASE_CAPSULE | Freq: Every day | ORAL | Status: DC

## 2016-02-09 MED ORDER — LOVASTATIN 40 MG PO TABS: ORAL_TABLET | ORAL | Status: DC

## 2016-02-09 NOTE — Telephone Encounter (Signed)
Reduce dose to 5 units pls

## 2016-02-09 NOTE — Telephone Encounter (Signed)
Fasting- 85,71,93,71,79,79,91  Bedtime- 132,140,183,129,169,139 Still on 7 units with some am lows. Do you want to decrease his dose to 5? Or stay the same.

## 2016-02-09 NOTE — Telephone Encounter (Signed)
Left message for son and to call back when he received the message

## 2016-02-20 DIAGNOSIS — Z0000 Encounter for general adult medical examination without abnormal findings: Secondary | ICD-10-CM | POA: Diagnosis not present

## 2016-02-20 DIAGNOSIS — N401 Enlarged prostate with lower urinary tract symptoms: Secondary | ICD-10-CM | POA: Diagnosis not present

## 2016-02-20 DIAGNOSIS — R3915 Urgency of urination: Secondary | ICD-10-CM | POA: Diagnosis not present

## 2016-02-25 DIAGNOSIS — E1165 Type 2 diabetes mellitus with hyperglycemia: Secondary | ICD-10-CM | POA: Diagnosis not present

## 2016-02-25 DIAGNOSIS — E1121 Type 2 diabetes mellitus with diabetic nephropathy: Secondary | ICD-10-CM | POA: Diagnosis not present

## 2016-02-26 LAB — COMPLETE METABOLIC PANEL WITH GFR
ALT: 7 U/L — ABNORMAL LOW (ref 9–46)
AST: 11 U/L (ref 10–35)
Albumin: 3.6 g/dL (ref 3.6–5.1)
Alkaline Phosphatase: 56 U/L (ref 40–115)
BUN: 22 mg/dL (ref 7–25)
CALCIUM: 8.7 mg/dL (ref 8.6–10.3)
CHLORIDE: 109 mmol/L (ref 98–110)
CO2: 26 mmol/L (ref 20–31)
CREATININE: 1.64 mg/dL — AB (ref 0.70–1.11)
GFR, Est African American: 44 mL/min — ABNORMAL LOW (ref >=60)
GFR, Est Non African American: 38 mL/min — ABNORMAL LOW (ref >=60)
GLUCOSE: 75 mg/dL (ref 65–99)
Potassium: 4.3 mmol/L (ref 3.5–5.3)
SODIUM: 144 mmol/L (ref 135–146)
Total Bilirubin: 0.5 mg/dL (ref 0.2–1.2)
Total Protein: 6.9 g/dL (ref 6.1–8.1)

## 2016-02-26 LAB — HEMOGLOBIN A1C
HEMOGLOBIN A1C: 7.6 % — AB (ref <5.7)
MEAN PLASMA GLUCOSE: 171 mg/dL

## 2016-03-05 ENCOUNTER — Ambulatory Visit (HOSPITAL_COMMUNITY)
Admission: RE | Admit: 2016-03-05 | Discharge: 2016-03-05 | Disposition: A | Discharge status: Home / Self Care | Payer: Commercial Managed Care - HMO | Source: Ambulatory Visit | Attending: Family Medicine | Admitting: Family Medicine

## 2016-03-05 ENCOUNTER — Other Ambulatory Visit: Payer: Self-pay

## 2016-03-05 ENCOUNTER — Ambulatory Visit (INDEPENDENT_AMBULATORY_CARE_PROVIDER_SITE_OTHER): Payer: Commercial Managed Care - HMO | Admitting: Family Medicine

## 2016-03-05 ENCOUNTER — Encounter: Payer: Self-pay | Admitting: Family Medicine

## 2016-03-05 VITALS — BP 128/78 | HR 60 | Resp 18 | Ht 72.0 in | Wt 252.0 lb

## 2016-03-05 DIAGNOSIS — R0602 Shortness of breath: Secondary | ICD-10-CM | POA: Diagnosis not present

## 2016-03-05 DIAGNOSIS — Z01818 Encounter for other preprocedural examination: Secondary | ICD-10-CM

## 2016-03-05 DIAGNOSIS — R05 Cough: Secondary | ICD-10-CM | POA: Diagnosis not present

## 2016-03-05 DIAGNOSIS — E1165 Type 2 diabetes mellitus with hyperglycemia: Secondary | ICD-10-CM

## 2016-03-05 DIAGNOSIS — R9431 Abnormal electrocardiogram [ECG] [EKG]: Secondary | ICD-10-CM | POA: Diagnosis not present

## 2016-03-05 DIAGNOSIS — E785 Hyperlipidemia, unspecified: Secondary | ICD-10-CM

## 2016-03-05 DIAGNOSIS — E1121 Type 2 diabetes mellitus with diabetic nephropathy: Secondary | ICD-10-CM

## 2016-03-05 DIAGNOSIS — IMO0002 Reserved for concepts with insufficient information to code with codable children: Secondary | ICD-10-CM

## 2016-03-05 LAB — POCT URINALYSIS DIPSTICK
Bilirubin, UA: NEGATIVE
GLUCOSE UA: NEGATIVE
KETONES UA: NEGATIVE
Leukocytes, UA: NEGATIVE
Nitrite, UA: NEGATIVE
PROTEIN UA: NEGATIVE
SPEC GRAV UA: 1.025
Urobilinogen, UA: 1
pH, UA: 6.5

## 2016-03-05 NOTE — Patient Instructions (Addendum)
Annual wellness in 3 month, call if you need me before  You are referred to cardiology for evaluation and clearance before your surgery  CONGRATS on improved blood sugar , we are proud of you!!!  CXR today!\  Fasting lipid, cmp and EGFr, hBA1C and CBC in 3 month  Pls get diabetic shoes at [facility] today, script provided

## 2016-03-05 NOTE — Progress Notes (Signed)
   Subjective:    Patient ID: Samuel Ryan, male    DOB: [birth year], 82 y.o.   MRN: [id number]  HPI Pt in for medical examination needed prior to proposed surgery for incontinence Son states that he is opting against general anesthesia which I fully support Pt c/o dry cough, ep when he lies down, also needs increased watery nasal drainage , which is expected at this time of the year Denies polyuria, polydipsia, blurred vision , or hypoglycemic episodes. Marked improvement in lood sugar  Review of Systems See HPI Denies recent fever or chills. Denies sinus pressure, nasal congestion, ear pain or sore throat. Denies chest congestion, productive cough or wheezing.Does c/o shortness of breath with activity, unchanged Denies chest pains, palpitations, PND, orthopnea  and leg swelling Denies abdominal pain, nausea, vomiting,diarrhea or constipation.   Denies dysuria, frequency, hesitancy or incontinence. C/o  joint pain, swelling and limitation in mobility.Relies on walker for safe mobility Denies headaches, seizures, numbness, or tingling. Denies depression, anxiety or insomnia. Denies skin break down or rash.         Objective:   Physical Exam BP 128/78 mmHg  Pulse 60  Resp 18  Ht 6' (1.829 m)  Wt 252 lb (114.306 kg)  BMI 34.17 kg/m2  SpO2 99%  Patient alert and oriented and in no cardiopulmonary distress.  HEENT: No facial asymmetry, EOMI,   oropharynx pink and moist.  Neck adequate ROMle no JVD, no mass.  Chest: Clear to auscultation bilaterally.  CVS: S1, S2 no murmurs, no S3.Regular rate. EKG:No ischemia or LVH. RBB and first degree AV block  ABD: Soft non tender.   Ext: No edema  MS: decreased ROM spine, shoulders, hips and knees  Skin: Intact, no ulcerations or rash noted.  Psych: Good eye contact, normal affect. Memory impaired, not depressed or anxious appearing  CNS: CN 2-12 intact, power,  normal throughout.no focal deficits noted.          Assessment & Plan:  Preoperative clearance History and general exam as well as level of chronic conditions make him medically cleared. UA and CXR are good EKG shows RBB which is not new, and 1st degree AV block. He has multiple cV risk factors and has not had cardiology eval for over 6 years. Will obtain cardiology evaluation and clearance before fully clearing for surgery. Referral is entered. Son is aware and is in agreement with plan

## 2016-03-06 NOTE — Assessment & Plan Note (Signed)
History and general exam as well as level of chronic conditions make him medically cleared. UA and CXR are good EKG shows RBB which is not new, and 1st degree AV block. He has multiple cV risk factors and has not had cardiology eval for over 6 years. Will obtain cardiology evaluation and clearance before fully clearing for surgery. Referral is entered. Son is aware and is in agreement with plan

## 2016-04-12 ENCOUNTER — Telehealth: Payer: Self-pay

## 2016-04-12 ENCOUNTER — Other Ambulatory Visit: Payer: Self-pay

## 2016-04-12 MED ORDER — BENZONATATE 100 MG PO CAPS: 100.0000 mg | ORAL_CAPSULE | Freq: Two times a day (BID) | ORAL | Status: DC

## 2016-04-12 NOTE — Telephone Encounter (Signed)
Patient aware and med sent in

## 2016-04-12 NOTE — Telephone Encounter (Signed)
Sugar free robitussin dM and send in 7 day course of tessalon perles pls, call back for appt for worsenign symptoms suggestive of infection, pls educate

## 2016-04-16 ENCOUNTER — Telehealth: Payer: Self-pay | Admitting: Family Medicine

## 2016-04-16 MED ORDER — AZITHROMYCIN 250 MG PO TABS: ORAL_TABLET | ORAL | Status: DC

## 2016-04-16 NOTE — Telephone Encounter (Signed)
Patient states that his congestion is some improved.   He is still taking Tessalon Perles along with Flonase, and Claritin.  Would like to know if there is any further advice.

## 2016-04-16 NOTE — Telephone Encounter (Signed)
Patient aware.

## 2016-04-16 NOTE — Telephone Encounter (Signed)
Renard Matter Streifel is calling in regards to Mr. Knoblock stating that he is still coughing up colored mucos, meds not helping, please advise?

## 2016-04-16 NOTE — Telephone Encounter (Signed)
Z pack sent in, if worse needs OV next week

## 2016-04-19 ENCOUNTER — Telehealth: Payer: Self-pay | Admitting: Family Medicine

## 2016-04-19 NOTE — Telephone Encounter (Signed)
Samuel Ryan' Samuel Ryan is asking for a returned call from the nurse he has questions regarding his father having anesthia

## 2016-04-19 NOTE — Telephone Encounter (Signed)
Advised to express his concerns when he goes to cardiology for clearance and to call back if his questions don't get answered and I would find an answer for him

## 2016-04-27 ENCOUNTER — Other Ambulatory Visit: Payer: Self-pay | Admitting: Family Medicine

## 2016-04-28 IMAGING — US US ART/VEN ABD/PELV/SCROTUM DOPPLER LTD
1 series · 13 of 25 positions shown · non-contrast
Comparison: CT abdomen pelvis 06/02/2015; 11/15/2015.

CLINICAL DATA: Patient with testicular and pelvic pain/ pressure.

EXAM:
SCROTAL ULTRASOUND
DOPPLER ULTRASOUND OF THE TESTICLES
TECHNIQUE: Complete ultrasound examination of the testicles, epididymis, and
other scrotal structures was performed. Color and spectral Doppler
ultrasound were also utilized to evaluate blood flow to the
testicles.

[Series 1: us art/ven abd/pelv/scrotum doppler ltd · 0.07mm/px · 13 of 69 slices shown]
[im 1/69]
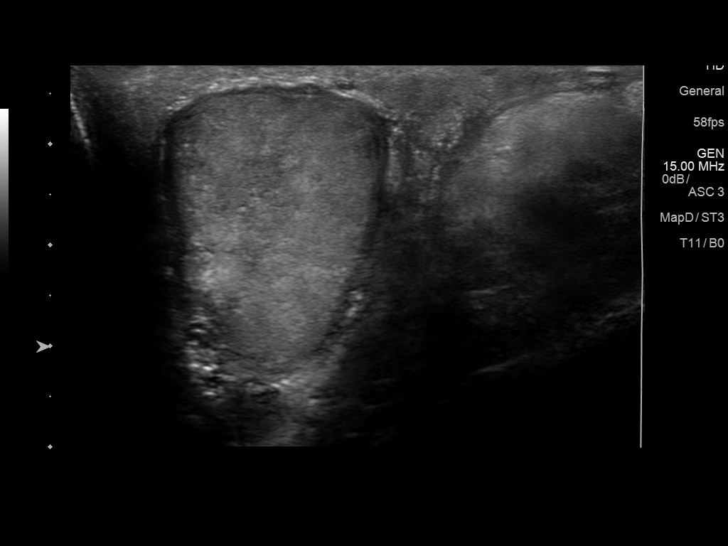
[im 6/69]
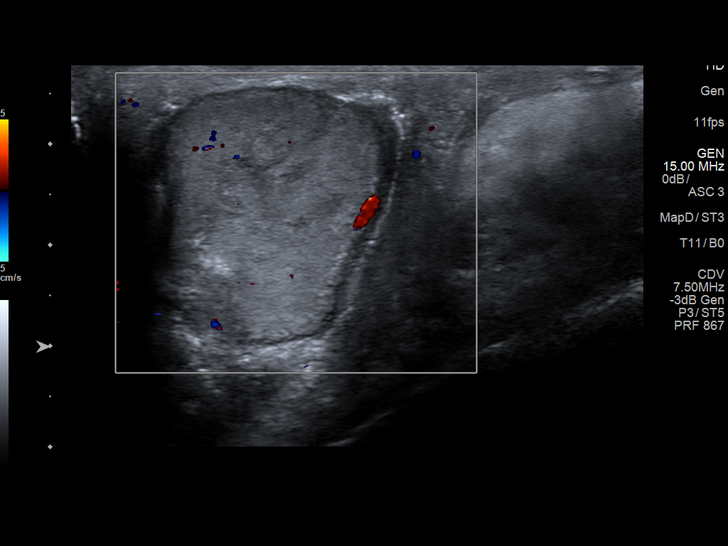
[im 12/69]
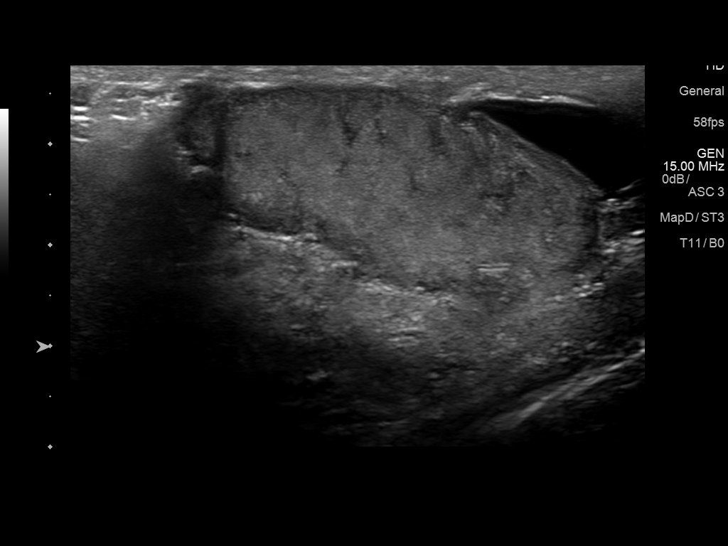
[im 18/69]
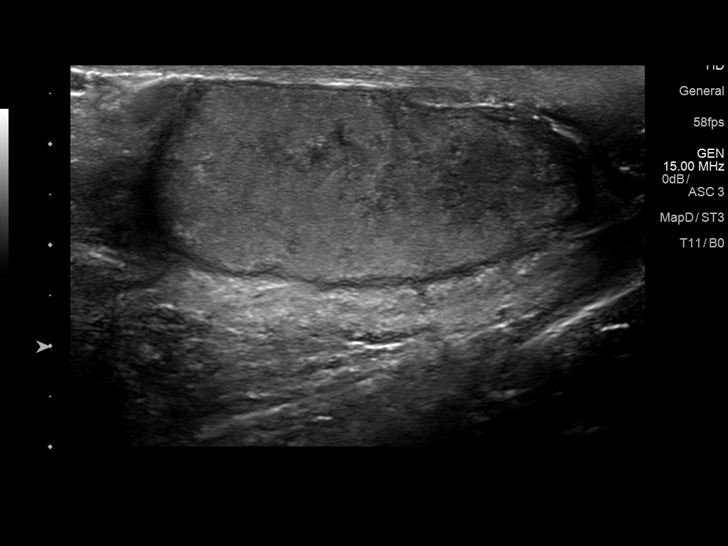
[im 23/69]
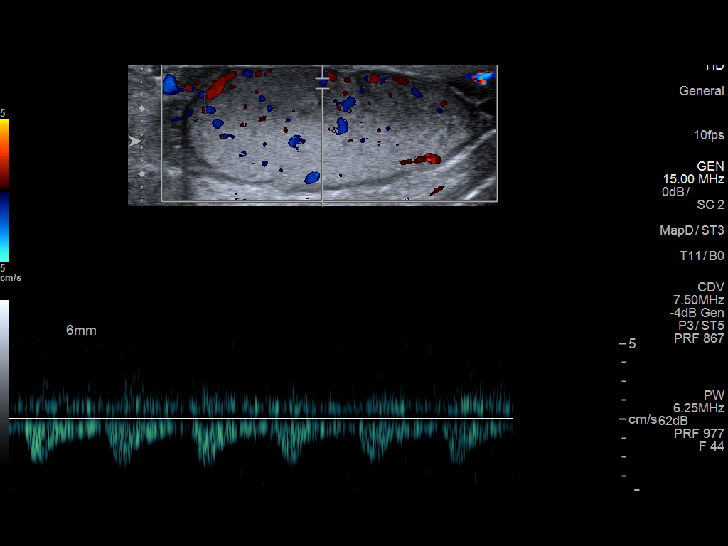
[im 29/69]
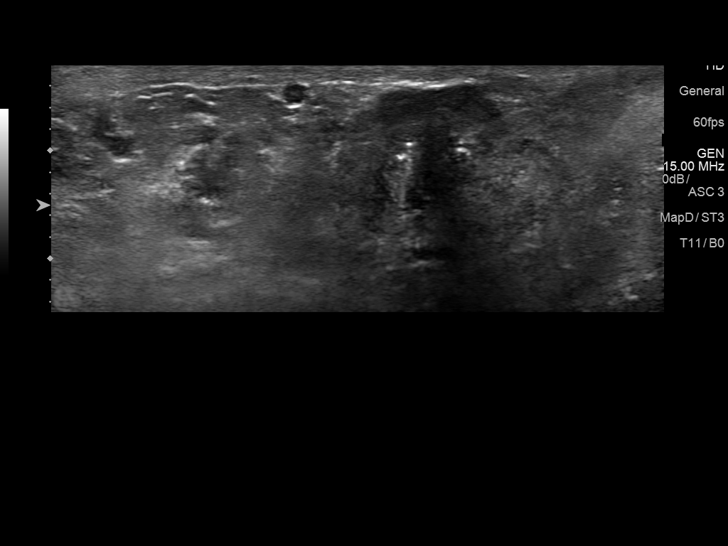
[im 35/69]
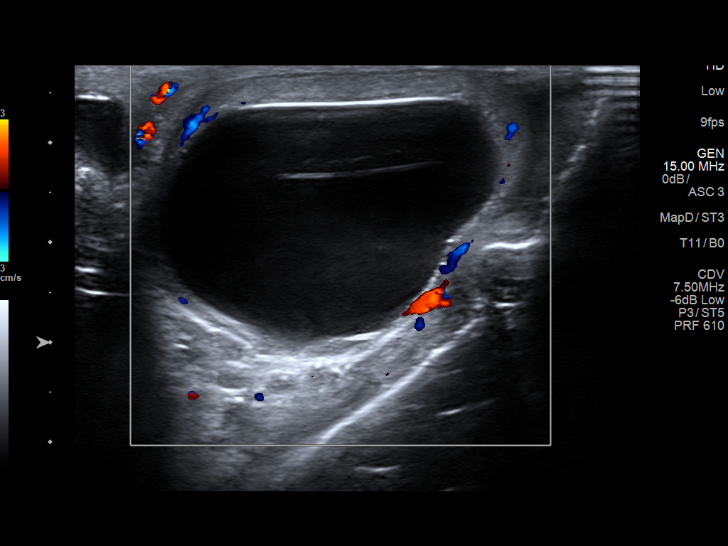
[im 40/69]
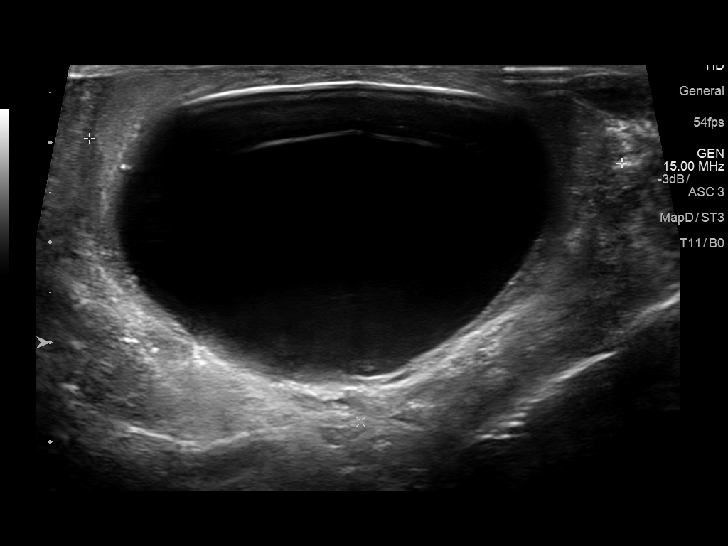
[im 46/69]
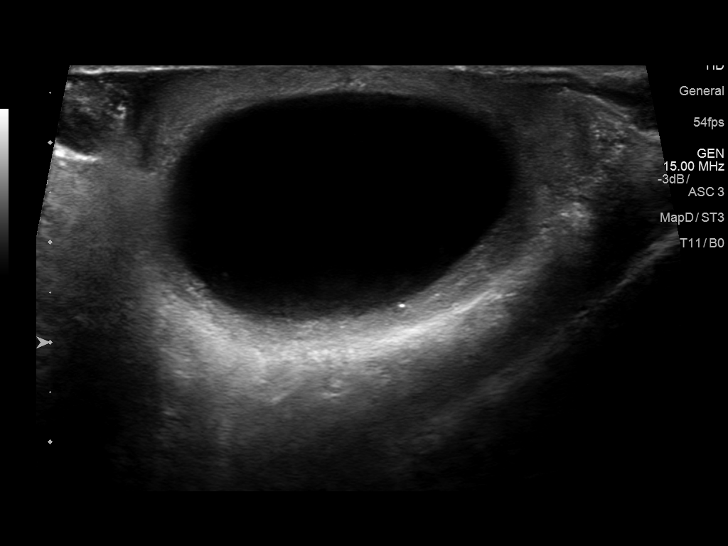
[im 52/69]
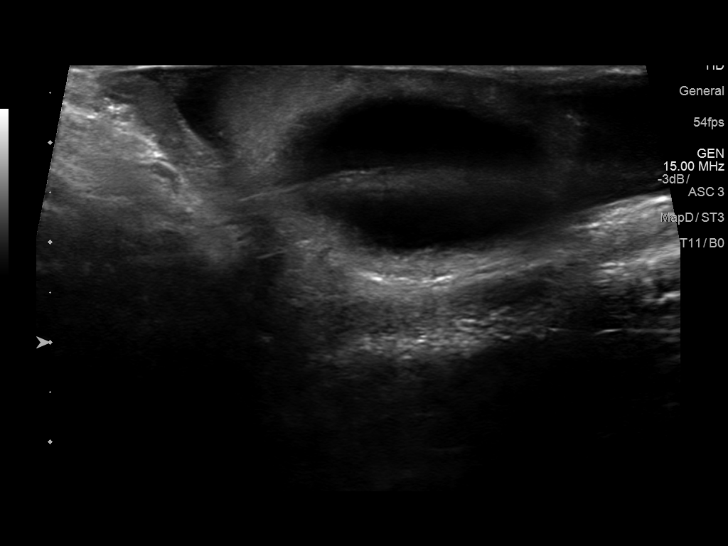
[im 57/69]
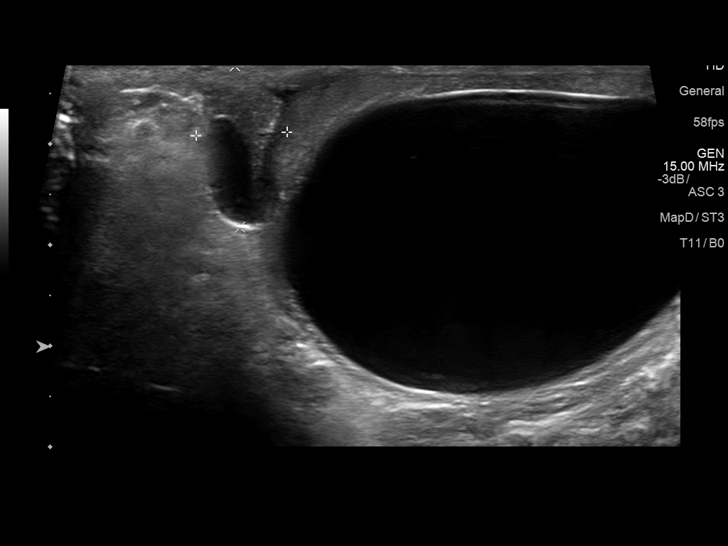
[im 63/69]
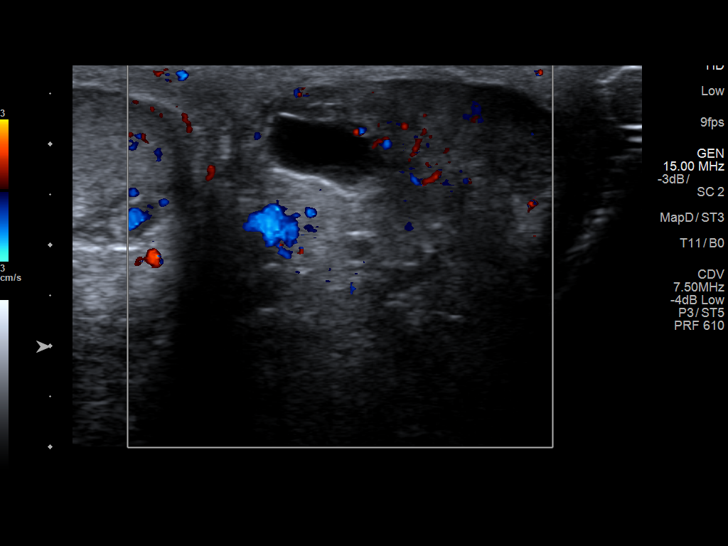
[im 69/69]
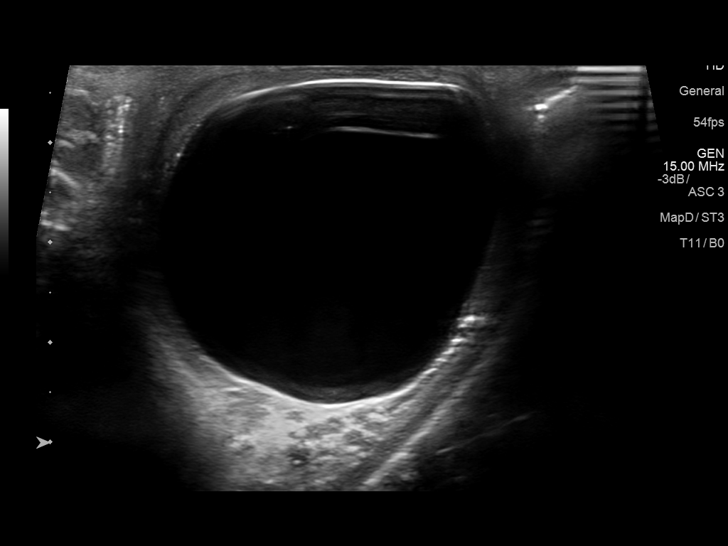

[13 of 25 positions shown; findings below may reference images not displayed]

FINDINGS: Right testicle

Measurements: 4.5 x 2.4 x 2.0 cm. Heterogeneous in echogenicity most
compatible with atrophy.

Left testicle

Measurements: 4.6 x 2.9 x 3.4 cm. The majority of the left testicle
is replaced by a large cyst which measures up to 4.6 cm.

Right epididymis:  Small epididymal head cyst versus spermatocele.

Left epididymis:  Small epididymal head cyst versus spermatocele.

Hydrocele:  None visualized.

Varicocele:  None visualized.

Pulsed Doppler interrogation of both testes demonstrates normal low
resistance arterial and venous waveforms bilaterally.
IMPRESSION: Arterial and venous waveforms are demonstrated within the testicles
bilaterally. No definite evidence to suggest torsion.

The majority of the left testicle is replaced by a large simple
appearing cyst.

Heterogeneity of the right testicular parenchyma likely secondary to
atrophy.

## 2016-04-30 ENCOUNTER — Encounter: Payer: Self-pay | Admitting: Cardiology

## 2016-04-30 ENCOUNTER — Other Ambulatory Visit: Payer: Self-pay | Admitting: Family Medicine

## 2016-04-30 ENCOUNTER — Ambulatory Visit (INDEPENDENT_AMBULATORY_CARE_PROVIDER_SITE_OTHER): Payer: Commercial Managed Care - HMO | Admitting: Cardiology

## 2016-04-30 ENCOUNTER — Ambulatory Visit: Payer: Self-pay | Admitting: *Deleted

## 2016-04-30 VITALS — BP 140/80 | HR 93 | Wt 253.0 lb

## 2016-04-30 DIAGNOSIS — Z86711 Personal history of pulmonary embolism: Secondary | ICD-10-CM | POA: Diagnosis not present

## 2016-04-30 DIAGNOSIS — I10 Essential (primary) hypertension: Secondary | ICD-10-CM | POA: Diagnosis not present

## 2016-04-30 DIAGNOSIS — E785 Hyperlipidemia, unspecified: Secondary | ICD-10-CM

## 2016-04-30 DIAGNOSIS — Z01818 Encounter for other preprocedural examination: Secondary | ICD-10-CM

## 2016-04-30 NOTE — Patient Instructions (Signed)
Your physician recommends that you schedule a follow-up appointment in:  To be determined  Your physician has requested that you have an echocardiogram. Echocardiography is a painless test that uses sound waves to create images of your heart. It provides your doctor with information about the size and shape of your heart and how well your heart's chambers and valves are working. This procedure takes approximately one hour. There are no restrictions for this procedure.    Your physician recommends that you continue on your current medications as directed. Please refer to the Current Medication list given to you today.     If you need a refill on your cardiac medications before your next appointment, please call your pharmacy.   Thank you for choosing [facility] !

## 2016-04-30 NOTE — Progress Notes (Signed)
Cardiology Office Note  Date: 04/30/2016   ID: Samuel Ryan, DOB 16-Aug-1931, MRN [id number]  PCP: Tula Nakayama, MD  Consulting Cardiologist: Rozann Lesches, MD   Chief Complaint  Patient presents with  . Preoperative evaluation    History of Present Illness: Samuel Ryan is an 82 y.o. male referred for cardiology consultation by Dr. Moshe Cipro. He is here today with his son. I do not have complete information as yet, but it seems that he is being considered for elective urological surgery to help manage prostatism with associated urinary frequency. We are requesting information from his urologist in [place].  He was previously followed in our anticoagulation clinic several years ago when he was on Coumadin with diagnosis of pulmonary embolus as of 2012. He did not have other cardiac specific diagnosis at that point. He has since come off of Coumadin.  He ambulates with a rolling walker, reports NYHA class II dyspnea with typical ADLs which are fairly limited. No exertional chest pain, palpitations, or syncope. His son mentions to me in private that [name] has had trouble with dementia, and he is concerned about the potential for memory loss with anesthesia.  I reviewed the most recent ECG from April which shows sinus rhythm with prolonged PR interval and right bundle branch block. Right bundle branch block is old. His last echocardiogram was in 2013 is reviewed below.  Past Medical History  Diagnosis Date  . Type 2 diabetes mellitus ([place])   . Mixed hyperlipidemia   . Essential hypertension, benign   . Dementia   . BPH (benign prostatic hypertrophy)   . Pulmonary embolism ([place])     Diagnosed 7/12  . Deep venous thrombosis ([facility])     Left leg, diagnosed 7/12  . Osteoarthritis   . Diverticulitis   . Acute cholecystitis 12/12/2012 to 12/19/2012    Drained percutaneously  . History of pneumonia     Past Surgical History  Procedure Laterality Date  . Carpal tunnel  release    . Right total knee replacement  04/23/2011  . Colonoscopy  08/31/2012    Procedure: COLONOSCOPY;  Surgeon: Rogene Houston, MD;  Location: [facility];  Service: Endoscopy;  Laterality: N/A;  830  . Eye surgery Bilateral     bilateral cataract  . Umbilical hernia repair      umbilical  . Spine surgery      Neck fusion    Current Outpatient Prescriptions  Medication Sig Dispense Refill  . acetaminophen (TYLENOL) 500 MG tablet Take 1,000 mg by mouth every 6 (six) hours as needed for moderate pain.    . benazepril (LOTENSIN) 5 MG tablet TAKE 1 TABLET EVERY DAY 90 tablet 1  . clobetasol cream (TEMOVATE) [id number] % APPLY 1 APPLICATION TOPICALLY 2 (TWO) TIMES DAILY. 30 g 3  . donepezil (ARICEPT) 10 MG tablet TAKE 1 TABLET AT BEDTIME 90 tablet 1  . ferrous sulfate 325 (65 FE) MG tablet TAKE 1 TABLET TWICE DAILY (Patient taking differently: TAKE 1 TABLET  DAILY) 180 tablet 3  . fluticasone (FLONASE) 50 MCG/ACT nasal spray Place 2 sprays into both nostrils daily. 16 g 6  . glipiZIDE (GLUCOTROL XL) 10 MG 24 hr tablet TAKE TWO TABLETS EVERY MORNING AT BREAKFAST (DOSE INCREASE) 180 tablet 1  . glucose blood (TRUE METRIX BLOOD GLUCOSE TEST) test strip Twice daily testing 100 each 1  . Insulin Glargine (LANTUS) 100 UNIT/ML Solostar Pen 7 units every morning at breakfast 15 mL 11  .  Insulin Pen Needle (PEN NEEDLES) 31G X 5 MM MISC 1 each by Does not apply route as directed. 100 each 1  . linagliptin (TRADJENTA) 5 MG TABS tablet Take 1 tablet (5 mg total) by mouth daily. 30 tablet 5  . loratadine (CLARITIN) 10 MG tablet Take 10 mg by mouth daily.    [name] lovastatin (MEVACOR) 40 MG tablet TAKE 1 TABLET AT BEDTIME 90 tablet 1  . omeprazole (PRILOSEC) 40 MG capsule TAKE 1 CAPSULE EVERY DAY 90 capsule 1  . Polyethylene Glycol 400 (BLINK TEARS OP) Apply 1 drop to eye daily as needed (dry eyes).    . sertraline (ZOLOFT) 50 MG tablet TAKE 1 TABLET EVERY DAY 90 tablet 1  . tamsulosin (FLOMAX) 0.4 MG CAPS  capsule TAKE 1 CAPSULE EVERY DAY 90 capsule 1  . traMADol (ULTRAM) 50 MG tablet Take 1 tablet (50 mg total) by mouth 2 (two) times daily. 180 tablet 1  . Vitamin D, Ergocalciferol, (DRISDOL) 50000 units CAPS capsule TAKE 1 CAPSULE EVERY 7 (SEVEN) DAYS. 4 capsule 5   No current facility-administered medications for this visit.   Allergies:  Vancomycin and Zosyn   Social History: The patient  reports that he has never smoked. He has never used smokeless tobacco. He reports that he does not drink alcohol or use illicit drugs.   Family History: The patient's family history includes Cancer in his brother and mother; Diabetes in his brother and sister; Hypertension in his sister.   ROS:  Please see the history of present illness. Otherwise, complete review of systems is positive for decreased hearing.  All other systems are reviewed and negative.   Physical Exam: VS:  BP 140/80 mmHg  Pulse 93  Wt 253 lb (114.76 kg)  SpO2 98%, BMI Body mass index is 34.31 kg/(m^2).  Wt Readings from Last 3 Encounters:  04/30/16 253 lb (114.76 kg)  03/05/16 252 lb (114.306 kg)  01/23/16 246 lb (111.585 kg)    General: Overweight elderly male, appears comfortable at rest. HEENT: Conjunctiva and lids normal, oropharynx clear. Neck: Supple, no elevated JVP or carotid bruits, no thyromegaly. Lungs: Clear to auscultation with decreased breath sounds, nonlabored breathing at rest. Cardiac: Regular rate and rhythm, no S3, soft systolic murmur, no pericardial rub. Abdomen: Soft, nontender, protuberant, bowel sounds present. Extremities: Trace ankle edema, distal pulses 2+. Skin: Warm and dry. Musculoskeletal: No kyphosis. Neuropsychiatric: Alert and oriented x3, affect grossly appropriate.  ECG: I personally reviewed the tracing from 03/05/2016 which showed sinus rhythm with prolonged PR interval and right bundle branch block.  Recent Labwork: 06/30/2015: TSH 0.458 11/15/2015: Platelets 158 11/25/2015:  Hemoglobin 11.2* 02/25/2016: ALT 7*; AST 11; BUN 22; Creat 1.64*; Potassium 4.3; Sodium 144     Component Value Date/Time   CHOL 136 11/25/2015 0953   TRIG 77 11/25/2015 0953   HDL 44 11/25/2015 0953   CHOLHDL 3.1 11/25/2015 0953   VLDL 15 11/25/2015 0953   LDLCALC 77 11/25/2015 0953    Other Studies Reviewed Today:  Echocardiogram 07/10/2012: Study Conclusions  - Left ventricle: The cavity size was normal. There was mild concentric hypertrophy. Systolic function was vigorous. The estimated ejection fraction was in the range of 65% to 70%. Wall motion was normal; there were no regional wall motion abnormalities. - Aortic valve: Mildly calcified annulus. - Atrial septum: No defect or patent foramen ovale was identified.  Assessment and Plan:  1. Preoperative cardiac evaluation in an 82 year old male with no history of obstructive CAD or myocardial infarction,  previous pulmonary embolus on Coumadin as of 2012 but without recurrence and now off anticoagulation, type 2 diabetes mellitus, hyperlipidemia, and hypertension. He does not report any active angina symptoms or worsening shortness of breath with typical ADLs approximating 4 METs. ECG reviewed and overall stable with old right bundle-branch block. We plan to obtain an echocardiogram to assure stability in cardiac structure and function. Unless there has been substantial decline, he should not require any further ischemic testing at this point. I plan to get details from the patient's urologist regarding surgery, but it sounds like this is planned to be done as an outpatient under fairly short anesthesia.  2. Essential hypertension, systolic pressure [id number] today. He is on Lotensin.  3. Hyperlipidemia, on Mevacor, recent LDL 77.  4. Previous history of DVT and pulmonary embolus back in 2012, previously on Coumadin. He is followed by Dr. Moshe Cipro.  Current medicines were reviewed with the patient today.   Orders Placed  This Encounter  Procedures  . ECHOCARDIOGRAM COMPLETE    Disposition: Call with results.  Signed, Satira Sark, MD, [facility] 04/30/2016 1:52 PM    [facility] [address], [place], [place] [address] Phone: [phone]; Fax: [phone]

## 2016-05-04 ENCOUNTER — Telehealth: Payer: Self-pay

## 2016-05-04 DIAGNOSIS — R509 Fever, unspecified: Secondary | ICD-10-CM | POA: Diagnosis not present

## 2016-05-04 DIAGNOSIS — R0602 Shortness of breath: Secondary | ICD-10-CM | POA: Diagnosis not present

## 2016-05-04 DIAGNOSIS — J40 Bronchitis, not specified as acute or chronic: Secondary | ICD-10-CM | POA: Diagnosis not present

## 2016-05-04 DIAGNOSIS — R05 Cough: Secondary | ICD-10-CM | POA: Diagnosis not present

## 2016-05-04 NOTE — Telephone Encounter (Signed)
Will follow up with patient as needed

## 2016-05-07 ENCOUNTER — Ambulatory Visit (HOSPITAL_COMMUNITY)
Admission: RE | Admit: 2016-05-07 | Discharge: 2016-05-07 | Disposition: A | Discharge status: Home / Self Care | Payer: Commercial Managed Care - HMO | Source: Ambulatory Visit | Attending: Cardiology | Admitting: Cardiology

## 2016-05-07 DIAGNOSIS — Z01818 Encounter for other preprocedural examination: Secondary | ICD-10-CM | POA: Insufficient documentation

## 2016-05-07 DIAGNOSIS — I119 Hypertensive heart disease without heart failure: Secondary | ICD-10-CM | POA: Insufficient documentation

## 2016-05-07 DIAGNOSIS — Z86711 Personal history of pulmonary embolism: Secondary | ICD-10-CM | POA: Insufficient documentation

## 2016-05-07 DIAGNOSIS — E119 Type 2 diabetes mellitus without complications: Secondary | ICD-10-CM | POA: Insufficient documentation

## 2016-05-07 LAB — ECHOCARDIOGRAM COMPLETE
CHL CUP MV DEC (S): 310
E decel time: 310 msec
E/e' ratio: 7.14
FS: 48 % — AB (ref 28–44)
IVS/LV PW RATIO, ED: 0.93
LA vol index: 17.9 mL/m2
LA vol: 43.8 mL
LADIAMINDEX: 1.43 cm/m2
LASIZE: 35 mm
LAVOLA4C: 36.4 mL
LDCA: 2.84 cm2
LEFT ATRIUM END SYS DIAM: 35 mm
LV E/e'average: 7.14
LV PW d: 13.5 mm — AB (ref 0.6–1.1)
LV TDI E'LATERAL: 10.8
LV TDI E'MEDIAL: 9.25
LV dias vol index: 23 mL/m2
LVDIAVOL: 56 mL — AB (ref 62–150)
LVEEMED: 7.14
LVELAT: 10.8 cm/s
LVOT diameter: 19 mm
LVSYSVOL: 23 mL (ref 21–61)
LVSYSVOLIN: 9 mL/m2
MV Peak grad: 2 mmHg
MVPKAVEL: 99.9 m/s
MVPKEVEL: 77.1 m/s
RV LATERAL S' VELOCITY: 13.9 cm/s
Simpson's disk: 59
Stroke v: 33 ml
TAPSE: 18.9 mm

## 2016-05-08 ENCOUNTER — Telehealth: Payer: Self-pay | Admitting: Family Medicine

## 2016-05-08 DIAGNOSIS — E1165 Type 2 diabetes mellitus with hyperglycemia: Principal | ICD-10-CM

## 2016-05-08 DIAGNOSIS — E1121 Type 2 diabetes mellitus with diabetic nephropathy: Secondary | ICD-10-CM

## 2016-05-08 DIAGNOSIS — IMO0002 Reserved for concepts with insufficient information to code with codable children: Secondary | ICD-10-CM

## 2016-05-08 NOTE — Telephone Encounter (Signed)
Pls pt needs updated cBC, cmp and EGFr, then he can have letter of clearance sent for his procedure, pls see info in your area  ??/ pls ask  pls do asap, thanks! Finally cleared by cardiology

## 2016-05-09 NOTE — Addendum Note (Signed)
Addended by: Denman George B on: 05/09/2016 06:37 PM   Modules accepted: Orders

## 2016-05-10 ENCOUNTER — Telehealth: Payer: Self-pay | Admitting: Family Medicine

## 2016-05-10 NOTE — Telephone Encounter (Signed)
Pls advise stop benazepril, arrange f/u in office this week or early next week

## 2016-05-10 NOTE — Telephone Encounter (Signed)
Opal Sidles Silence is asking if the nurse would please call him back regarding his father Samuel Ryan, he was taken to Urgent Care last week and was given medication for congestion and cough, but his cough is not better

## 2016-05-10 NOTE — Telephone Encounter (Signed)
Care giver called back and stated to call Samuel Ryan back at the house, [name] is at work and cant get the phone call

## 2016-05-10 NOTE — Telephone Encounter (Signed)
Patient aware- will have son call back and schedule

## 2016-05-10 NOTE — Telephone Encounter (Signed)
Son states that patient went to urgent care and has taken all of his antibiotics for coughing and he still has a bad cough, some white congestion but its worse at night and has been going on 3-4 weeks. (pt is on ACE) CXR results in your box from urgent care

## 2016-05-12 NOTE — Telephone Encounter (Signed)
Spoke with son and he is aware to stop the benazepril and will call back and schedule an appt to bring Sheridan next week

## 2016-05-12 NOTE — Telephone Encounter (Signed)
Called and left message for patient to return call.   Clearance faxed to urology

## 2016-05-13 NOTE — Telephone Encounter (Signed)
Patient aware of the need for labs and will have done at next appt that son is to call back and schedule.

## 2016-05-20 ENCOUNTER — Encounter: Payer: Self-pay | Admitting: Family Medicine

## 2016-05-20 ENCOUNTER — Other Ambulatory Visit: Payer: Self-pay | Admitting: Family Medicine

## 2016-05-20 ENCOUNTER — Ambulatory Visit (INDEPENDENT_AMBULATORY_CARE_PROVIDER_SITE_OTHER): Payer: Commercial Managed Care - HMO | Admitting: Family Medicine

## 2016-05-20 VITALS — BP 134/76 | HR 86 | Resp 18 | Ht 72.0 in | Wt 254.1 lb

## 2016-05-20 DIAGNOSIS — R05 Cough: Secondary | ICD-10-CM

## 2016-05-20 DIAGNOSIS — Z794 Long term (current) use of insulin: Secondary | ICD-10-CM

## 2016-05-20 DIAGNOSIS — F329 Major depressive disorder, single episode, unspecified: Secondary | ICD-10-CM

## 2016-05-20 DIAGNOSIS — G309 Alzheimer's disease, unspecified: Secondary | ICD-10-CM | POA: Diagnosis not present

## 2016-05-20 DIAGNOSIS — R053 Chronic cough: Secondary | ICD-10-CM

## 2016-05-20 DIAGNOSIS — IMO0002 Reserved for concepts with insufficient information to code with codable children: Secondary | ICD-10-CM

## 2016-05-20 DIAGNOSIS — E785 Hyperlipidemia, unspecified: Secondary | ICD-10-CM

## 2016-05-20 DIAGNOSIS — Z01818 Encounter for other preprocedural examination: Secondary | ICD-10-CM

## 2016-05-20 DIAGNOSIS — E1165 Type 2 diabetes mellitus with hyperglycemia: Secondary | ICD-10-CM | POA: Diagnosis not present

## 2016-05-20 DIAGNOSIS — F0280 Dementia in other diseases classified elsewhere without behavioral disturbance: Secondary | ICD-10-CM

## 2016-05-20 DIAGNOSIS — E1121 Type 2 diabetes mellitus with diabetic nephropathy: Secondary | ICD-10-CM | POA: Diagnosis not present

## 2016-05-20 DIAGNOSIS — F0393 Unspecified dementia, unspecified severity, with mood disturbance: Secondary | ICD-10-CM

## 2016-05-20 MED ORDER — PREDNISONE 5 MG PO TABS: 5.0000 mg | ORAL_TABLET | Freq: Two times a day (BID) | ORAL | Status: DC

## 2016-05-20 NOTE — Patient Instructions (Addendum)
Wellness as before  PLEASE stop cough drops, also reduce caffeine intake  NO blood work before next visit, we will add HBA1C to today's lab  You are referred to lung specialist, Dr Luan Pulling  Five days of prednisone sent to local pharmacy to help with cough

## 2016-05-21 LAB — COMPLETE METABOLIC PANEL WITH GFR
ALBUMIN: 3.4 g/dL — AB (ref 3.6–5.1)
ALK PHOS: 55 U/L (ref 40–115)
ALT: 9 U/L (ref 9–46)
AST: 14 U/L (ref 10–35)
BILIRUBIN TOTAL: 0.5 mg/dL (ref 0.2–1.2)
BUN: 15 mg/dL (ref 7–25)
CALCIUM: 8.3 mg/dL — AB (ref 8.6–10.3)
CO2: 26 mmol/L (ref 20–31)
Chloride: 105 mmol/L (ref 98–110)
Creat: 1.57 mg/dL — ABNORMAL HIGH (ref 0.70–1.11)
GFR, Est African American: 46 mL/min — ABNORMAL LOW (ref >=60)
GFR, Est Non African American: 40 mL/min — ABNORMAL LOW (ref >=60)
Glucose, Bld: 117 mg/dL — ABNORMAL HIGH (ref 65–99)
POTASSIUM: 4.6 mmol/L (ref 3.5–5.3)
Sodium: 141 mmol/L (ref 135–146)
Total Protein: 6.5 g/dL (ref 6.1–8.1)

## 2016-05-21 LAB — CBC
HEMATOCRIT: 35.5 % — AB (ref 38.5–50.0)
HEMOGLOBIN: 11.5 g/dL — AB (ref 13.2–17.1)
MCH: 30.2 pg (ref 27.0–33.0)
MCHC: 32.4 g/dL (ref 32.0–36.0)
MCV: 93.2 fL (ref 80.0–100.0)
MPV: 9.2 fL (ref 7.5–12.5)
Platelets: 161 10*3/uL (ref 140–400)
RBC: 3.81 MIL/uL — ABNORMAL LOW (ref 4.20–5.80)
RDW: 14.3 % (ref 11.0–15.0)
WBC: 4.9 10*3/uL (ref 3.8–10.8)

## 2016-05-21 LAB — HEMOGLOBIN A1C
Hgb A1c MFr Bld: 8 % — ABNORMAL HIGH (ref <5.7)
MEAN PLASMA GLUCOSE: 183 mg/dL

## 2016-05-21 NOTE — Assessment & Plan Note (Signed)
Samuel Ryan is reminded of the importance of commitment to daily physical activity for 30 minutes or more, as able and the need to limit carbohydrate intake to 30 to 60 grams per meal to help with blood sugar control.   The need to take medication as prescribed, test blood sugar as directed, and to call between visits if there is a concern that blood sugar is uncontrolled is also discussed.   Samuel Ryan is reminded of the importance of daily foot exam, annual eye examination, and good blood sugar, blood pressure and cholesterol control.  Diabetic Labs Latest Ref Rng 05/20/2016 02/25/2016 11/25/2015 11/15/2015 09/17/2015  HbA1c <5.7 % - 7.6(H) 9.1(H) - -  Microalbumin Not estab mg/dL - - - - 3.1  Micro/Creat Ratio <30 mcg/mg creat - - - - 24  Chol 125 - 200 mg/dL - - 136 - -  HDL >=40 mg/dL - - 44 - -  Calc LDL <130 mg/dL - - 77 - -  Triglycerides <150 mg/dL - - 77 - -  Creatinine 0.70 - 1.11 mg/dL 1.57(H) 1.64(H) 1.58(H) 1.49(H) -   BP/Weight 05/20/2016 04/30/2016 03/05/2016 01/23/2016 12/03/2015 11/27/2015 99991111  Systolic BP Q000111Q [id number] 0000000 0000000 0000000 [id number]  Diastolic BP 76 80 78 76 72 72 77  Wt. (Lbs) 254.12 253 252 246 245 244 244  BMI 34.46 34.31 34.17 33.36 33.22 35.01 35.01   Foot/eye exam completion dates Latest Ref Rng 09/22/2015 09/22/2015  Eye Exam No Retinopathy No Retinopathy No Retinopathy  Foot Form Completion - - -       Updated lab today

## 2016-05-21 NOTE — Assessment & Plan Note (Signed)
Hyperlipidemia:Low fat diet discussed and encouraged.   Lipid Panel  Lab Results  Component Value Date   CHOL 136 11/25/2015   HDL 44 11/25/2015   LDLCALC 77 11/25/2015   TRIG 77 11/25/2015   CHOLHDL 3.1 11/25/2015      Controlled, no change in medication

## 2016-05-21 NOTE — Progress Notes (Signed)
DAMAINE Ryan     MRN: [id number]      DOB: 10-12-1931   HPI Samuel Ryan is here for follow up and re-evaluation of chronic medical conditions, medication management and review of any available recent lab and radiology data.  He recently d/c benazepril in the hope that this would reduce his chronic cough, unfortunately little to no change , and cough remains , worse at night, sometimes seems as though choking on his saliva. Was at urgent care recently about the problem, recently completed antibiotc course and decongestants, CXR at the time was clear, no sign of infectio. Recently had cardiology clearance for needed urology procedure Preventive health is updated, specifically  Cancer screening and Immunization. Denies polyuria, polydipsia, blurred vision , or hypoglycemic episodes.    ROS Denies recent fever or chills. Denies sinus pressure, nasal congestion, ear pain or sore throat.  Denies chest pains, palpitations and leg swelling Denies abdominal pain, nausea, vomiting,diarrhea or constipation.   Denies dysuria, frequency, hesitancy or incontinence. Denies uncontrolled  joint pain, swelling and has chronic  limitation in mobility. Denies headaches, seizures, numbness, or tingling. Denies depression, anxiety or insomnia. Denies skin break down or rash.   PE  BP 134/76 mmHg  Pulse 86  Resp 18  Ht 6' (1.829 m)  Wt 254 lb 1.9 oz (115.268 kg)  BMI 34.46 kg/m2  SpO2 91%  Patient alert and oriented and in no cardiopulmonary distress.  HEENT: No facial asymmetry, EOMI,   oropharynx pink and moist.  Neck supple no JVD, no mass.  Chest: Clear to auscultation bilaterally.  CVS: S1, S2 no murmurs, no S3.Regular rate.  ABD: Soft non tender.   Ext: No edema  MS: decreased  ROM spine, shoulders, hips and knees.  Skin: Intact, no ulcerations or rash noted.  Psych: Good eye contact, normal affect. Memory intact not anxious or depressed appearing.  CNS: CN 2-12 intact, power,   normal throughout.no focal deficits noted.   Assessment & Plan   Chronic coughing Unchanged and disruptive, worse at night, on maintainance allergy and reflux medication Advised against cough drops, and caffeine. Refer to pulmonary for eval and management assistance Short course of prednisone prescribed also  Preoperative clearance Medically cleared fully for proposed urologic surgery, awaiting date from surgeon  Dementia of the Alzheimer's type without behavioral disturbance Controlled, no change in medication   Depression due to dementia Controlled, no change in medication   Type 2 diabetes, uncontrolled, with renal manifestation Samuel Ryan is reminded of the importance of commitment to daily physical activity for 30 minutes or more, as able and the need to limit carbohydrate intake to 30 to 60 grams per meal to help with blood sugar control.   The need to take medication as prescribed, test blood sugar as directed, and to call between visits if there is a concern that blood sugar is uncontrolled is also discussed.   Samuel Ryan is reminded of the importance of daily foot exam, annual eye examination, and good blood sugar, blood pressure and cholesterol control.  Diabetic Labs Latest Ref Rng 05/20/2016 02/25/2016 11/25/2015 11/15/2015 09/17/2015  HbA1c <5.7 % - 7.6(H) 9.1(H) - -  Microalbumin Not estab mg/dL - - - - 3.1  Micro/Creat Ratio <30 mcg/mg creat - - - - 24  Chol 125 - 200 mg/dL - - 136 - -  HDL >=40 mg/dL - - 44 - -  Calc LDL <130 mg/dL - - 77 - -  Triglycerides <150 mg/dL - - 77 - -  Creatinine 0.70 - 1.11 mg/dL 1.57(H) 1.64(H) 1.58(H) 1.49(H) -   BP/Weight 05/20/2016 04/30/2016 03/05/2016 01/23/2016 12/03/2015 11/27/2015 99991111  Systolic BP Q000111Q [id number] 0000000 0000000 0000000 [id number]  Diastolic BP 76 80 78 76 72 72 77  Wt. (Lbs) 254.12 253 252 246 245 244 244  BMI 34.46 34.31 34.17 33.36 33.22 35.01 35.01   Foot/eye exam completion dates Latest Ref Rng 09/22/2015 09/22/2015  Eye  Exam No Retinopathy No Retinopathy No Retinopathy  Foot Form Completion - - -       Updated lab today    Hyperlipidemia with target LDL less than 100 Hyperlipidemia:Low fat diet discussed and encouraged.   Lipid Panel  Lab Results  Component Value Date   CHOL 136 11/25/2015   HDL 44 11/25/2015   LDLCALC 77 11/25/2015   TRIG 77 11/25/2015   CHOLHDL 3.1 11/25/2015      Controlled, no change in medication

## 2016-05-21 NOTE — Assessment & Plan Note (Signed)
Controlled, no change in medication  

## 2016-05-21 NOTE — Assessment & Plan Note (Addendum)
Unchanged and disruptive, worse at night, on maintainance allergy and reflux medication Advised against cough drops, and caffeine. Refer to pulmonary for eval and management assistance Short course of prednisone prescribed also

## 2016-05-21 NOTE — Assessment & Plan Note (Signed)
Medically cleared fully for proposed urologic surgery, awaiting date from surgeon

## 2016-06-24 DIAGNOSIS — E119 Type 2 diabetes mellitus without complications: Secondary | ICD-10-CM | POA: Diagnosis not present

## 2016-06-24 DIAGNOSIS — R05 Cough: Secondary | ICD-10-CM | POA: Diagnosis not present

## 2016-06-24 DIAGNOSIS — F0390 Unspecified dementia without behavioral disturbance: Secondary | ICD-10-CM | POA: Diagnosis not present

## 2016-06-25 ENCOUNTER — Ambulatory Visit (INDEPENDENT_AMBULATORY_CARE_PROVIDER_SITE_OTHER): Payer: Commercial Managed Care - HMO | Admitting: Family Medicine

## 2016-06-25 ENCOUNTER — Other Ambulatory Visit: Payer: Self-pay

## 2016-06-25 ENCOUNTER — Encounter: Payer: Self-pay | Admitting: Family Medicine

## 2016-06-25 ENCOUNTER — Encounter (INDEPENDENT_AMBULATORY_CARE_PROVIDER_SITE_OTHER): Payer: Self-pay

## 2016-06-25 VITALS — BP 144/80 | HR 81 | Resp 16 | Ht 72.0 in | Wt 255.1 lb

## 2016-06-25 DIAGNOSIS — E785 Hyperlipidemia, unspecified: Secondary | ICD-10-CM

## 2016-06-25 DIAGNOSIS — R296 Repeated falls: Secondary | ICD-10-CM | POA: Diagnosis not present

## 2016-06-25 DIAGNOSIS — E1165 Type 2 diabetes mellitus with hyperglycemia: Secondary | ICD-10-CM

## 2016-06-25 DIAGNOSIS — E1121 Type 2 diabetes mellitus with diabetic nephropathy: Secondary | ICD-10-CM

## 2016-06-25 DIAGNOSIS — F0393 Unspecified dementia, unspecified severity, with mood disturbance: Secondary | ICD-10-CM

## 2016-06-25 DIAGNOSIS — R350 Frequency of micturition: Secondary | ICD-10-CM

## 2016-06-25 DIAGNOSIS — I10 Essential (primary) hypertension: Secondary | ICD-10-CM

## 2016-06-25 DIAGNOSIS — Z9181 History of falling: Secondary | ICD-10-CM

## 2016-06-25 DIAGNOSIS — F0280 Dementia in other diseases classified elsewhere without behavioral disturbance: Secondary | ICD-10-CM

## 2016-06-25 DIAGNOSIS — G309 Alzheimer's disease, unspecified: Secondary | ICD-10-CM

## 2016-06-25 DIAGNOSIS — Z794 Long term (current) use of insulin: Secondary | ICD-10-CM

## 2016-06-25 DIAGNOSIS — IMO0002 Reserved for concepts with insufficient information to code with codable children: Secondary | ICD-10-CM

## 2016-06-25 DIAGNOSIS — F329 Major depressive disorder, single episode, unspecified: Secondary | ICD-10-CM

## 2016-06-25 MED ORDER — LOVASTATIN 40 MG PO TABS: 40.0000 mg | ORAL_TABLET | Freq: Every day | ORAL | 1 refills | Status: DC

## 2016-06-25 MED ORDER — OMEPRAZOLE 40 MG PO CPDR: 40.0000 mg | DELAYED_RELEASE_CAPSULE | Freq: Every day | ORAL | 1 refills | Status: DC

## 2016-06-25 MED ORDER — TRAMADOL HCL 50 MG PO TABS: 50.0000 mg | ORAL_TABLET | Freq: Two times a day (BID) | ORAL | 1 refills | Status: DC

## 2016-06-25 MED ORDER — GLIPIZIDE ER 10 MG PO TB24: ORAL_TABLET | ORAL | 1 refills | Status: DC

## 2016-06-25 MED ORDER — TAMSULOSIN HCL 0.4 MG PO CAPS: 0.4000 mg | ORAL_CAPSULE | Freq: Every day | ORAL | 1 refills | Status: DC

## 2016-06-25 NOTE — Patient Instructions (Addendum)
F/u in early October, call if you need me sooner  INCREASE insulin to 8 units at bedtime  Thankful cough is over and you feel well  New script for walker with wheels as current is malfunctioning, brakes not working  We will contact social worker to attempt to get help for you with meals on wheels  Thank you  for choosing [facility]. We consider it a privelige to serve you.  Delivering excellent health care in a caring and  compassionate way is our goal.  Partnering with you,  so that together we can achieve this goal is our strategy.

## 2016-06-27 NOTE — Assessment & Plan Note (Signed)
Hyperlipidemia:Low fat diet discussed and encouraged.   Lipid Panel  Lab Results  Component Value Date   CHOL 136 11/25/2015   HDL 44 11/25/2015   LDLCALC 77 11/25/2015   TRIG 77 11/25/2015   CHOLHDL 3.1 11/25/2015   Updated lab needed at/ before next visit.

## 2016-06-27 NOTE — Assessment & Plan Note (Signed)
Sub optimal , but adequate control, no change in management

## 2016-06-27 NOTE — Assessment & Plan Note (Signed)
Controlled, and stable,  no change in medication

## 2016-06-27 NOTE — Progress Notes (Signed)
Samuel Ryan     MRN: [id number]      DOB: 09-25-1931   HPI Samuel Ryan is here for follow up and re-evaluation of chronic medical conditions, medication management and review of any available recent lab and radiology data.  Preventive health is updated, specifically  Cancer screening and Immunization.   Questions or concerns regarding consultations or procedures which the PT has had in the interim are  Addressed.chronic cough resolved once he stopped the ACE, still saw pulmonary however, no changes made. Son still having difficulty getting meals on wheels instated and we will reach out to [facility] for help with this His walker is malfunctioning , brakes not holding, needs a new one, age over 2 years, needs a walker with a seat for safe independent ambulation both inside and outside of his home Fasting blood sugars are increased to 150 to 170, needs higher dose of insulin, and will follow up in next 2 to 4 weeks with tele call The PT denies any adverse reactions to current medications since the last visit.    ROS Denies recent fever or chills. Denies sinus pressure, nasal congestion, ear pain or sore throat. Denies chest congestion, productive cough or wheezing. Denies chest pains, palpitations and leg swelling Denies abdominal pain, nausea, vomiting,diarrhea or constipation.   Denies dysuria, frequency, hesitancy or incontinence. C/o chronic  joint pain, stiffness and limitation in mobility. Denies headaches, seizures, numbness, or tingling. Denies depression, anxiety or insomnia. Denies skin break down or rash.   PE  BP (!) 144/80 (BP Location: Left Arm, Patient Position: Sitting, Cuff Size: Large)   Pulse 81   Resp 16   Ht 6' (1.829 m)   Wt 255 lb 1.9 oz (115.7 kg)   SpO2 92%   BMI 34.60 kg/m   Patient alert and oriented and in no cardiopulmonary distress.  HEENT: No facial asymmetry, EOMI,   oropharynx pink and moist.  Neck supple no JVD, no mass.  Chest: Clear to  auscultation bilaterally.decreased though adequate air entry  CVS: S1, S2 no murmurs, no S3.Regular rate.  ABD: Soft non tender.   Ext: No edema  MS: decreased  ROM spine, shoulders, hips and knees.  Skin: Intact, no ulcerations or rash noted.  Psych: Good eye contact, normal affect. Memory intact not anxious or depressed appearing.  CNS: CN 2-12 intact, power,  normal throughout.no focal deficits noted.   Assessment & Plan  Type 2 diabetes, uncontrolled, with renal manifestation Deteriorated, review of blood sugar log shows higher numbers , averaging 150 to 170 foir fasting, increase insulin to 8 units, reduce carb intake, recheck in 2 weeks by  Tele Samuel Ryan is reminded of the importance of commitment to daily physical activity for 30 minutes or more, as able and the need to limit carbohydrate intake to 30 to 60 grams per meal to help with blood sugar control.   The need to take medication as prescribed, test blood sugar as directed, and to call between visits if there is a concern that blood sugar is uncontrolled is also discussed.   Samuel Ryan is reminded of the importance of daily foot exam, annual eye examination, and good blood sugar, blood pressure and cholesterol control.  Diabetic Labs Latest Ref Rng & Units 05/20/2016 02/25/2016 11/25/2015 11/15/2015 09/17/2015  HbA1c <5.7 % 8.0(H) 7.6(H) 9.1(H) - -  Microalbumin Not estab mg/dL - - - - 3.1  Micro/Creat Ratio <30 mcg/mg creat - - - - 24  Chol 125 - 200  mg/dL - - 136 - -  HDL >=40 mg/dL - - 44 - -  Calc LDL <130 mg/dL - - 77 - -  Triglycerides <150 mg/dL - - 77 - -  Creatinine 0.70 - 1.11 mg/dL 1.57(H) 1.64(H) 1.58(H) 1.49(H) -   BP/Weight 06/25/2016 05/20/2016 04/30/2016 03/05/2016 01/23/2016 12/03/2015 99991111  Systolic BP 123456 Q000111Q [id number] 0000000 0000000 0000000 [id number]  Diastolic BP 80 76 80 78 76 72 72  Wt. (Lbs) 255.12 254.12 253 252 246 245 244  BMI 34.6 34.46 34.31 34.17 33.36 33.22 35.01   Foot/eye exam completion dates Latest Ref  Rng & Units 09/22/2015 09/22/2015  Eye Exam No Retinopathy No Retinopathy No Retinopathy  Foot Form Completion - - -      Updated lab needed at/ before next visit.   Urinary frequency Controlled, no change in medication   At high risk for falls Current equipment is worn and malfunctioning, reports that brake is not working, replacement script for walker with bench and wheels provided, he is at high fall risk due o severe arthritis  Dementia of the Alzheimer's type without behavioral disturbance Controlled, and stable,  no change in medication   Hyperlipidemia with target LDL less than 100 Hyperlipidemia:Low fat diet discussed and encouraged.   Lipid Panel  Lab Results  Component Value Date   CHOL 136 11/25/2015   HDL 44 11/25/2015   LDLCALC 77 11/25/2015   TRIG 77 11/25/2015   CHOLHDL 3.1 11/25/2015   Updated lab needed at/ before next visit.     Depression due to dementia Controlled, no change in medication   Essential hypertension Sub optimal , but adequate control, no change in management

## 2016-06-27 NOTE — Assessment & Plan Note (Signed)
Controlled, no change in medication  

## 2016-06-27 NOTE — Assessment & Plan Note (Signed)
Deteriorated, review of blood sugar log shows higher numbers , averaging 150 to 170 foir fasting, increase insulin to 8 units, reduce carb intake, recheck in 2 weeks by  Tele Mr. Franchina is reminded of the importance of commitment to daily physical activity for 30 minutes or more, as able and the need to limit carbohydrate intake to 30 to 60 grams per meal to help with blood sugar control.   The need to take medication as prescribed, test blood sugar as directed, and to call between visits if there is a concern that blood sugar is uncontrolled is also discussed.   Mr. Morelan is reminded of the importance of daily foot exam, annual eye examination, and good blood sugar, blood pressure and cholesterol control.  Diabetic Labs Latest Ref Rng & Units 05/20/2016 02/25/2016 11/25/2015 11/15/2015 09/17/2015  HbA1c <5.7 % 8.0(H) 7.6(H) 9.1(H) - -  Microalbumin Not estab mg/dL - - - - 3.1  Micro/Creat Ratio <30 mcg/mg creat - - - - 24  Chol 125 - 200 mg/dL - - 136 - -  HDL >=40 mg/dL - - 44 - -  Calc LDL <130 mg/dL - - 77 - -  Triglycerides <150 mg/dL - - 77 - -  Creatinine 0.70 - 1.11 mg/dL 1.57(H) 1.64(H) 1.58(H) 1.49(H) -   BP/Weight 06/25/2016 05/20/2016 04/30/2016 03/05/2016 01/23/2016 12/03/2015 99991111  Systolic BP 123456 Q000111Q [id number] 0000000 0000000 0000000 [id number]  Diastolic BP 80 76 80 78 76 72 72  Wt. (Lbs) 255.12 254.12 253 252 246 245 244  BMI 34.6 34.46 34.31 34.17 33.36 33.22 35.01   Foot/eye exam completion dates Latest Ref Rng & Units 09/22/2015 09/22/2015  Eye Exam No Retinopathy No Retinopathy No Retinopathy  Foot Form Completion - - -      Updated lab needed at/ before next visit.

## 2016-06-27 NOTE — Assessment & Plan Note (Signed)
Current equipment is worn and malfunctioning, reports that brake is not working, replacement script for walker with bench and wheels provided, he is at high fall risk due o severe arthritis

## 2016-07-06 ENCOUNTER — Telehealth: Payer: Self-pay

## 2016-07-06 NOTE — Telephone Encounter (Signed)
Increase lantus to 10 unitts daily, send new readings next week pls

## 2016-07-06 NOTE — Telephone Encounter (Signed)
FASTING glucose has been elevated since stopping the ACE inhibitor.  159 149 172 182 176 145 210 165 153 165  136      Is taking 7 units of lantus and glipizide 10 mg 2 tabs qd. Do you want to restart the tradjenta? Or increase the lantus?

## 2016-07-08 NOTE — Telephone Encounter (Signed)
Patient aware.

## 2016-07-13 ENCOUNTER — Telehealth: Payer: Self-pay | Admitting: Family Medicine

## 2016-07-13 ENCOUNTER — Other Ambulatory Visit: Payer: Self-pay

## 2016-07-13 MED ORDER — PEN NEEDLES 31G X 5 MM MISC: 1.0000 | 1 refills | Status: DC

## 2016-07-13 NOTE — Telephone Encounter (Signed)
Needs insulin needles called in , please advise?

## 2016-07-13 NOTE — Telephone Encounter (Signed)
Pen needles refilled 

## 2016-07-22 ENCOUNTER — Other Ambulatory Visit: Payer: Self-pay

## 2016-07-22 MED ORDER — CLOBETASOL PROPIONATE 0.05 % EX CREA: TOPICAL_CREAM | CUTANEOUS | 3 refills | Status: DC

## 2016-07-23 ENCOUNTER — Other Ambulatory Visit: Payer: Self-pay | Admitting: Family Medicine

## 2016-07-23 DIAGNOSIS — M159 Polyosteoarthritis, unspecified: Secondary | ICD-10-CM | POA: Diagnosis not present

## 2016-07-23 DIAGNOSIS — E1129 Type 2 diabetes mellitus with other diabetic kidney complication: Secondary | ICD-10-CM | POA: Diagnosis not present

## 2016-07-23 DIAGNOSIS — R296 Repeated falls: Secondary | ICD-10-CM | POA: Diagnosis not present

## 2016-07-23 DIAGNOSIS — M479 Spondylosis, unspecified: Secondary | ICD-10-CM | POA: Diagnosis not present

## 2016-07-23 DIAGNOSIS — Z96659 Presence of unspecified artificial knee joint: Secondary | ICD-10-CM | POA: Diagnosis not present

## 2016-07-23 DIAGNOSIS — G308 Other Alzheimer's disease: Secondary | ICD-10-CM | POA: Diagnosis not present

## 2016-08-04 ENCOUNTER — Ambulatory Visit (INDEPENDENT_AMBULATORY_CARE_PROVIDER_SITE_OTHER): Payer: Commercial Managed Care - HMO | Admitting: Urology

## 2016-08-04 DIAGNOSIS — N401 Enlarged prostate with lower urinary tract symptoms: Secondary | ICD-10-CM

## 2016-08-04 DIAGNOSIS — R351 Nocturia: Secondary | ICD-10-CM

## 2016-08-06 ENCOUNTER — Other Ambulatory Visit: Payer: Self-pay | Admitting: Urology

## 2016-08-09 ENCOUNTER — Other Ambulatory Visit: Payer: Self-pay | Admitting: Urology

## 2016-08-12 DIAGNOSIS — Z794 Long term (current) use of insulin: Secondary | ICD-10-CM | POA: Diagnosis not present

## 2016-08-12 DIAGNOSIS — E785 Hyperlipidemia, unspecified: Secondary | ICD-10-CM | POA: Diagnosis not present

## 2016-08-12 DIAGNOSIS — E1121 Type 2 diabetes mellitus with diabetic nephropathy: Secondary | ICD-10-CM | POA: Diagnosis not present

## 2016-08-12 DIAGNOSIS — E1165 Type 2 diabetes mellitus with hyperglycemia: Secondary | ICD-10-CM | POA: Diagnosis not present

## 2016-08-13 ENCOUNTER — Ambulatory Visit (INDEPENDENT_AMBULATORY_CARE_PROVIDER_SITE_OTHER): Payer: Commercial Managed Care - HMO | Admitting: Family Medicine

## 2016-08-13 ENCOUNTER — Encounter: Payer: Self-pay | Admitting: Family Medicine

## 2016-08-13 VITALS — BP 124/80 | HR 90 | Resp 16 | Ht 72.0 in | Wt 259.0 lb

## 2016-08-13 DIAGNOSIS — E785 Hyperlipidemia, unspecified: Secondary | ICD-10-CM | POA: Diagnosis not present

## 2016-08-13 DIAGNOSIS — E1121 Type 2 diabetes mellitus with diabetic nephropathy: Secondary | ICD-10-CM | POA: Diagnosis not present

## 2016-08-13 DIAGNOSIS — I10 Essential (primary) hypertension: Secondary | ICD-10-CM | POA: Diagnosis not present

## 2016-08-13 DIAGNOSIS — E049 Nontoxic goiter, unspecified: Secondary | ICD-10-CM

## 2016-08-13 DIAGNOSIS — E0789 Other specified disorders of thyroid: Secondary | ICD-10-CM

## 2016-08-13 DIAGNOSIS — F329 Major depressive disorder, single episode, unspecified: Secondary | ICD-10-CM

## 2016-08-13 DIAGNOSIS — F0280 Dementia in other diseases classified elsewhere without behavioral disturbance: Secondary | ICD-10-CM

## 2016-08-13 DIAGNOSIS — IMO0002 Reserved for concepts with insufficient information to code with codable children: Secondary | ICD-10-CM

## 2016-08-13 DIAGNOSIS — D497 Neoplasm of unspecified behavior of endocrine glands and other parts of nervous system: Secondary | ICD-10-CM

## 2016-08-13 DIAGNOSIS — Z794 Long term (current) use of insulin: Secondary | ICD-10-CM

## 2016-08-13 DIAGNOSIS — E1165 Type 2 diabetes mellitus with hyperglycemia: Secondary | ICD-10-CM

## 2016-08-13 DIAGNOSIS — Z23 Encounter for immunization: Secondary | ICD-10-CM

## 2016-08-13 DIAGNOSIS — F0393 Unspecified dementia, unspecified severity, with mood disturbance: Secondary | ICD-10-CM

## 2016-08-13 LAB — COMPLETE METABOLIC PANEL WITH GFR
ALBUMIN: 3.5 g/dL — AB (ref 3.6–5.1)
ALT: 8 U/L — AB (ref 9–46)
AST: 13 U/L (ref 10–35)
Alkaline Phosphatase: 54 U/L (ref 40–115)
BILIRUBIN TOTAL: 0.6 mg/dL (ref 0.2–1.2)
BUN: 16 mg/dL (ref 7–25)
CALCIUM: 8.6 mg/dL (ref 8.6–10.3)
CO2: 28 mmol/L (ref 20–31)
CREATININE: 1.66 mg/dL — AB (ref 0.70–1.11)
Chloride: 104 mmol/L (ref 98–110)
GFR, EST AFRICAN AMERICAN: 43 mL/min — AB (ref >=60)
GFR, EST NON AFRICAN AMERICAN: 38 mL/min — AB (ref >=60)
GLUCOSE: 149 mg/dL — AB (ref 65–99)
Potassium: 4.7 mmol/L (ref 3.5–5.3)
Sodium: 139 mmol/L (ref 135–146)
Total Protein: 6.7 g/dL (ref 6.1–8.1)

## 2016-08-13 LAB — LIPID PANEL
CHOL/HDL RATIO: 3 ratio (ref <=5.0)
Cholesterol: 133 mg/dL (ref 125–200)
HDL: 44 mg/dL (ref >=40)
LDL Cholesterol: 70 mg/dL (ref <130)
Triglycerides: 95 mg/dL (ref <150)
VLDL: 19 mg/dL (ref <30)

## 2016-08-13 LAB — HEMOGLOBIN A1C
Hgb A1c MFr Bld: 8.8 % — ABNORMAL HIGH (ref <5.7)
Mean Plasma Glucose: 206 mg/dL

## 2016-08-13 NOTE — Patient Instructions (Signed)
Annual physical exam in 6 weeks, call if you need me sooner  Flu vaccine today  Increase insulin to 15 units daily  Commit to exercise 15 minutes every day  Call weekly with blood sugar results to Brandi  Fasting blood sugar goal is 110 to 140  Need diabetic class , we are referring you  Excellent blood pressure and  cholesterol

## 2016-08-14 NOTE — Assessment & Plan Note (Signed)
Controlled, no change in medication  

## 2016-08-14 NOTE — Assessment & Plan Note (Addendum)
Deteriorated , will again try to start tradgenta Increase dose of levimir Samuel Ryan is reminded of the importance of commitment to daily physical activity for 30 minutes or more, as able and the need to limit carbohydrate intake to 30 to 60 grams per meal to help with blood sugar control.   The need to take medication as prescribed, test blood sugar as directed, and to call between visits if there is a concern that blood sugar is uncontrolled is also discussed.   Samuel Ryan is reminded of the importance of daily foot exam, annual eye examination, and good blood sugar, blood pressure and cholesterol control.  Diabetic Labs Latest Ref Rng & Units 08/12/2016 05/20/2016 02/25/2016 11/25/2015 11/15/2015  HbA1c <5.7 % 8.8(H) 8.0(H) 7.6(H) 9.1(H) -  Microalbumin Not estab mg/dL - - - - -  Micro/Creat Ratio <30 mcg/mg creat - - - - -  Chol 125 - 200 mg/dL 133 - - 136 -  HDL >=40 mg/dL 44 - - 44 -  Calc LDL <130 mg/dL 70 - - 77 -  Triglycerides <150 mg/dL 95 - - 77 -  Creatinine 0.70 - 1.11 mg/dL 1.66(H) 1.57(H) 1.64(H) 1.58(H) 1.49(H)   BP/Weight 08/13/2016 06/25/2016 05/20/2016 04/30/2016 03/05/2016 [id number]  Systolic BP A999333 123456 Q000111Q [id number] 0000000 0000000 0000000  Diastolic BP 80 80 76 80 78 76 72  Wt. (Lbs) 259 255.12 254.12 253 252 246 245  BMI 35.13 34.6 34.46 34.31 34.17 33.36 33.22   Foot/eye exam completion dates Latest Ref Rng & Units 09/22/2015 09/22/2015  Eye Exam No Retinopathy No Retinopathy No Retinopathy  Foot Form Completion - - -

## 2016-08-14 NOTE — Assessment & Plan Note (Signed)
Recommend ENT follow up, will ned to confirm with son if not seen in past 12 months

## 2016-08-14 NOTE — Progress Notes (Signed)
Samuel Ryan     MRN: [id number]      DOB: [birth year]   HPI Mr. Matlock is here for follow up and re-evaluation of chronic medical conditions, medication management and review of any available recent lab and radiology data.  Preventive health is updated, specifically  Cancer screening and Immunization.   Questions or concerns regarding consultations or procedures which the PT has had in the interim are  addressed. The PT denies any adverse reactions to current medications since the last visit.  There are no new concerns.  There are no specific complaints   ROS Denies recent fever or chills. Denies sinus pressure, nasal congestion, ear pain or sore throat. Denies chest congestion, productive cough or wheezing. Denies chest pains, palpitations and leg swelling Denies abdominal pain, nausea, vomiting,diarrhea or constipation.   Denies dysuria, frequency, hesitancy or incontinence. Denies joint pain, swelling and limitation in mobility. Denies headaches, seizures, numbness, or tingling. Denies depression, anxiety or insomnia. Denies skin break down or rash. Denies polyuria, polydipsia, blurred vision , or hypoglycemic episodes. Fasting blood sugars average over 150 and nearer 170, son denies dietary indiscretion   PE  BP 124/80   Pulse 90   Resp 16   Ht 6' (1.829 m)   Wt 259 lb (117.5 kg)   SpO2 (!) 89%   BMI 35.13 kg/m   Patient alert and oriented and in no cardiopulmonary distress.  HEENT: No facial asymmetry, EOMI,   oropharynx pink and moist.  Neck decreased ROM no JVD, goiter  Chest: Clear to auscultation bilaterally.Decreased though adequate ROM CVS: S1, S2 no murmurs, no S3.Regular rate.  ABD: Soft non tender.   Ext: No edema  MS: Decreased  ROM spine, shoulders, hips and knees.  Skin: Intact, no ulcerations or rash noted.  Psych: Good eye contact, normal affect. Memory impaired not anxious or depressed appearing.  CNS: CN 2-12 intact, power,  normal  throughout.no focal deficits noted.   Assessment & Plan  Essential hypertension Controlled, no change in medication   Hyperlipidemia with target LDL less than 100 Controlled, no change in medication Hyperlipidemia:Low fat diet discussed and encouraged.   Lipid Panel  Lab Results  Component Value Date   CHOL 133 08/12/2016   HDL 44 08/12/2016   LDLCALC 70 08/12/2016   TRIG 95 08/12/2016   CHOLHDL 3.0 08/12/2016       Type 2 diabetes, uncontrolled, with renal manifestation Deteriorated , will again try to start tradgenta Increase dose of levimir Mr. Lenton is reminded of the importance of commitment to daily physical activity for 30 minutes or more, as able and the need to limit carbohydrate intake to 30 to 60 grams per meal to help with blood sugar control.   The need to take medication as prescribed, test blood sugar as directed, and to call between visits if there is a concern that blood sugar is uncontrolled is also discussed.   Mr. Sifers is reminded of the importance of daily foot exam, annual eye examination, and good blood sugar, blood pressure and cholesterol control.  Diabetic Labs Latest Ref Rng & Units 08/12/2016 05/20/2016 02/25/2016 11/25/2015 11/15/2015  HbA1c <5.7 % 8.8(H) 8.0(H) 7.6(H) 9.1(H) -  Microalbumin Not estab mg/dL - - - - -  Micro/Creat Ratio <30 mcg/mg creat - - - - -  Chol 125 - 200 mg/dL 133 - - 136 -  HDL >=40 mg/dL 44 - - 44 -  Calc LDL <130 mg/dL 70 - - 77 -  Triglycerides <  150 mg/dL 95 - - 77 -  Creatinine 0.70 - 1.11 mg/dL 1.66(H) 1.57(H) 1.64(H) 1.58(H) 1.49(H)   BP/Weight 08/13/2016 06/25/2016 05/20/2016 04/30/2016 03/05/2016 [id number]  Systolic BP A999333 123456 Q000111Q [id number] 0000000 0000000 0000000  Diastolic BP 80 80 76 80 78 76 72  Wt. (Lbs) 259 255.12 254.12 253 252 246 245  BMI 35.13 34.6 34.46 34.31 34.17 33.36 33.22   Foot/eye exam completion dates Latest Ref Rng & Units 09/22/2015 09/22/2015  Eye Exam No Retinopathy No Retinopathy No Retinopathy    Foot Form Completion - - -        Depression due to dementia Controlled, no change in medication   Goiter Recommend ENT follow up, will ned to confirm with son if not seen in past 12 months

## 2016-08-14 NOTE — Assessment & Plan Note (Signed)
Controlled, no change in medication Hyperlipidemia:Low fat diet discussed and encouraged.   Lipid Panel  Lab Results  Component Value Date   CHOL 133 08/12/2016   HDL 44 08/12/2016   LDLCALC 70 08/12/2016   TRIG 95 08/12/2016   CHOLHDL 3.0 08/12/2016

## 2016-08-16 ENCOUNTER — Other Ambulatory Visit: Payer: Self-pay

## 2016-08-16 MED ORDER — INSULIN GLARGINE 100 UNIT/ML SOLOSTAR PEN: 15.0000 [IU] | PEN_INJECTOR | Freq: Every day | SUBCUTANEOUS | 99 refills | Status: DC

## 2016-08-16 MED ORDER — LINAGLIPTIN 5 MG PO TABS: 5.0000 mg | ORAL_TABLET | Freq: Every day | ORAL | 5 refills | Status: DC

## 2016-08-17 ENCOUNTER — Other Ambulatory Visit: Payer: Self-pay

## 2016-08-19 ENCOUNTER — Other Ambulatory Visit: Payer: Self-pay | Admitting: Family Medicine

## 2016-08-27 ENCOUNTER — Ambulatory Visit: Payer: Self-pay | Admitting: Family Medicine

## 2016-08-27 ENCOUNTER — Encounter (HOSPITAL_BASED_OUTPATIENT_CLINIC_OR_DEPARTMENT_OTHER): Payer: Self-pay | Admitting: *Deleted

## 2016-08-27 ENCOUNTER — Telehealth: Payer: Self-pay

## 2016-08-27 NOTE — Progress Notes (Addendum)
Spoke with son,Martelli -Has POA due to dementia, will bring paperwork ,although states his Dad has understanding of procedure and may sign consent. Instructed Npo after Mn-to arrive at 0715-mobility assistance needed,uses rolling w/c at home. Istat on arrival,Ekg ,echo with chart.Will take zoloft,aricept with small amt water.  ADDENDUM:   PT SON CALLED AND CONCERNED ABOUT ANESTHESIA AND ALSO DO NOT TELL PT OR MENTION TO HIM ABOUT DEMENTIA , PT IS NOT AWARE THAT HE HAS BEEN DX WITH THIS.  WILL WRITE NOTE FOR FRONT DESK AT CHECK IN AND TOLD PT SON, MARTELLI , THAT HE COULD BE WITH HIS DAD AT PRE=OP AND SPEAK W/ ANESTHESIA.

## 2016-08-27 NOTE — Telephone Encounter (Signed)
FASTING READINGS- [phone]  Advised his numbers are much better and to continue the same meds and keep up the good work!

## 2016-09-01 ENCOUNTER — Ambulatory Visit: Payer: Self-pay | Admitting: Family Medicine

## 2016-09-02 ENCOUNTER — Ambulatory Visit (HOSPITAL_BASED_OUTPATIENT_CLINIC_OR_DEPARTMENT_OTHER): Payer: Commercial Managed Care - HMO | Admitting: Anesthesiology

## 2016-09-02 ENCOUNTER — Encounter (HOSPITAL_BASED_OUTPATIENT_CLINIC_OR_DEPARTMENT_OTHER)
Admission: RE | Disposition: A | Discharge status: Home / Self Care | Payer: Self-pay | Source: Ambulatory Visit | Attending: Urology

## 2016-09-02 ENCOUNTER — Encounter (HOSPITAL_BASED_OUTPATIENT_CLINIC_OR_DEPARTMENT_OTHER): Payer: Self-pay

## 2016-09-02 ENCOUNTER — Ambulatory Visit (HOSPITAL_BASED_OUTPATIENT_CLINIC_OR_DEPARTMENT_OTHER)
Admission: RE | Admit: 2016-09-02 | Discharge: 2016-09-02 | Disposition: A | Discharge status: Home / Self Care | Payer: Commercial Managed Care - HMO | Source: Ambulatory Visit | Attending: Urology | Admitting: Urology

## 2016-09-02 DIAGNOSIS — I10 Essential (primary) hypertension: Secondary | ICD-10-CM | POA: Insufficient documentation

## 2016-09-02 DIAGNOSIS — E782 Mixed hyperlipidemia: Secondary | ICD-10-CM | POA: Diagnosis not present

## 2016-09-02 DIAGNOSIS — E119 Type 2 diabetes mellitus without complications: Secondary | ICD-10-CM | POA: Diagnosis not present

## 2016-09-02 DIAGNOSIS — N401 Enlarged prostate with lower urinary tract symptoms: Secondary | ICD-10-CM | POA: Insufficient documentation

## 2016-09-02 DIAGNOSIS — Z794 Long term (current) use of insulin: Secondary | ICD-10-CM | POA: Insufficient documentation

## 2016-09-02 DIAGNOSIS — F0390 Unspecified dementia without behavioral disturbance: Secondary | ICD-10-CM | POA: Insufficient documentation

## 2016-09-02 DIAGNOSIS — Z800 Family history of malignant neoplasm of digestive organs: Secondary | ICD-10-CM | POA: Diagnosis not present

## 2016-09-02 DIAGNOSIS — Z880 Allergy status to penicillin: Secondary | ICD-10-CM | POA: Insufficient documentation

## 2016-09-02 DIAGNOSIS — Z86711 Personal history of pulmonary embolism: Secondary | ICD-10-CM | POA: Diagnosis not present

## 2016-09-02 DIAGNOSIS — R338 Other retention of urine: Secondary | ICD-10-CM | POA: Diagnosis not present

## 2016-09-02 DIAGNOSIS — Z888 Allergy status to other drugs, medicaments and biological substances status: Secondary | ICD-10-CM | POA: Diagnosis not present

## 2016-09-02 DIAGNOSIS — N400 Enlarged prostate without lower urinary tract symptoms: Secondary | ICD-10-CM | POA: Diagnosis not present

## 2016-09-02 DIAGNOSIS — N138 Other obstructive and reflux uropathy: Secondary | ICD-10-CM | POA: Diagnosis not present

## 2016-09-02 DIAGNOSIS — R32 Unspecified urinary incontinence: Secondary | ICD-10-CM | POA: Diagnosis not present

## 2016-09-02 DIAGNOSIS — M1990 Unspecified osteoarthritis, unspecified site: Secondary | ICD-10-CM | POA: Diagnosis not present

## 2016-09-02 DIAGNOSIS — E1169 Type 2 diabetes mellitus with other specified complication: Secondary | ICD-10-CM | POA: Diagnosis not present

## 2016-09-02 DIAGNOSIS — Z881 Allergy status to other antibiotic agents status: Secondary | ICD-10-CM | POA: Diagnosis not present

## 2016-09-02 DIAGNOSIS — Z96651 Presence of right artificial knee joint: Secondary | ICD-10-CM | POA: Diagnosis not present

## 2016-09-02 HISTORY — DX: Unspecified urinary incontinence: R32

## 2016-09-02 HISTORY — PX: CYSTOSCOPY WITH INSERTION OF UROLIFT: SHX6678

## 2016-09-02 LAB — POCT I-STAT 4, (NA,K, GLUC, HGB,HCT)
GLUCOSE: 106 mg/dL — AB (ref 65–99)
HCT: 36 % — ABNORMAL LOW (ref 39.0–52.0)
HEMOGLOBIN: 12.2 g/dL — AB (ref 13.0–17.0)
Potassium: 4.6 mmol/L (ref 3.5–5.1)
Sodium: 144 mmol/L (ref 135–145)

## 2016-09-02 LAB — GLUCOSE, CAPILLARY: Glucose-Capillary: 120 mg/dL — ABNORMAL HIGH (ref 65–99)

## 2016-09-02 SURGERY — CYSTOSCOPY WITH INSERTION OF UROLIFT
Anesthesia: General

## 2016-09-02 MED ORDER — CIPROFLOXACIN IN D5W 400 MG/200ML IV SOLN
INTRAVENOUS | Status: AC
  Filled 2016-09-02: qty 200

## 2016-09-02 MED ORDER — CIPROFLOXACIN IN D5W 400 MG/200ML IV SOLN
400.0000 mg | Freq: Two times a day (BID) | INTRAVENOUS | Status: DC
  Administered 2016-09-02: 400 mg via INTRAVENOUS
  Filled 2016-09-02: qty 200

## 2016-09-02 MED ORDER — LIDOCAINE 2% (20 MG/ML) 5 ML SYRINGE
INTRAMUSCULAR | Status: AC
  Filled 2016-09-02: qty 5

## 2016-09-02 MED ORDER — HYDROMORPHONE HCL 1 MG/ML IJ SOLN
0.2500 mg | INTRAMUSCULAR | Status: DC | PRN
  Filled 2016-09-02: qty 1

## 2016-09-02 MED ORDER — PROMETHAZINE HCL 25 MG/ML IJ SOLN
6.2500 mg | INTRAMUSCULAR | Status: DC | PRN
  Filled 2016-09-02: qty 1

## 2016-09-02 MED ORDER — EPHEDRINE SULFATE 50 MG/ML IJ SOLN
INTRAMUSCULAR | Status: DC | PRN
  Administered 2016-09-02 (×3): 10 mg via INTRAVENOUS

## 2016-09-02 MED ORDER — ONDANSETRON HCL 4 MG/2ML IJ SOLN
INTRAMUSCULAR | Status: AC
  Filled 2016-09-02: qty 2

## 2016-09-02 MED ORDER — FENTANYL CITRATE (PF) 100 MCG/2ML IJ SOLN
INTRAMUSCULAR | Status: DC | PRN
  Administered 2016-09-02: 50 ug via INTRAVENOUS

## 2016-09-02 MED ORDER — DEXAMETHASONE SODIUM PHOSPHATE 10 MG/ML IJ SOLN
INTRAMUSCULAR | Status: AC
Start: 2016-09-02 — End: 2016-09-02
  Filled 2016-09-02: qty 1

## 2016-09-02 MED ORDER — PROPOFOL 10 MG/ML IV BOLUS
INTRAVENOUS | Status: AC
  Filled 2016-09-02: qty 20

## 2016-09-02 MED ORDER — LACTATED RINGERS IV SOLN
INTRAVENOUS | Status: DC
  Administered 2016-09-02: 08:00:00 via INTRAVENOUS
  Filled 2016-09-02: qty 1000

## 2016-09-02 MED ORDER — EPHEDRINE 5 MG/ML INJ
INTRAVENOUS | Status: AC
  Filled 2016-09-02: qty 10

## 2016-09-02 MED ORDER — STERILE WATER FOR IRRIGATION IR SOLN
Status: DC | PRN
  Administered 2016-09-02: 3000 mL

## 2016-09-02 MED ORDER — LIDOCAINE 2% (20 MG/ML) 5 ML SYRINGE
INTRAMUSCULAR | Status: DC | PRN
  Administered 2016-09-02: 100 mg via INTRAVENOUS

## 2016-09-02 MED ORDER — ONDANSETRON HCL 4 MG/2ML IJ SOLN
INTRAMUSCULAR | Status: DC | PRN
  Administered 2016-09-02: 4 mg via INTRAVENOUS

## 2016-09-02 MED ORDER — DEXAMETHASONE SODIUM PHOSPHATE 4 MG/ML IJ SOLN
INTRAMUSCULAR | Status: DC | PRN
  Administered 2016-09-02: 5 mg via INTRAVENOUS

## 2016-09-02 MED ORDER — PROPOFOL 10 MG/ML IV BOLUS
INTRAVENOUS | Status: DC | PRN
  Administered 2016-09-02: 150 mg via INTRAVENOUS

## 2016-09-02 MED ORDER — FENTANYL CITRATE (PF) 100 MCG/2ML IJ SOLN
INTRAMUSCULAR | Status: AC
  Filled 2016-09-02: qty 2

## 2016-09-02 SURGICAL SUPPLY — 25 items
BAG DRAIN URO-CYSTO SKYTR STRL (DRAIN) ×3 IMPLANT
BAG DRN UROCATH (DRAIN) ×1
BAG URINE DRAINAGE (UROLOGICAL SUPPLIES) ×2 IMPLANT
BAG URINE LEG 500ML (DRAIN) ×3 IMPLANT
CATH FOLEY 2WAY SLVR  5CC 16FR (CATHETERS) ×2
CATH FOLEY 2WAY SLVR  5CC 20FR (CATHETERS) ×2
CATH FOLEY 2WAY SLVR 5CC 16FR (CATHETERS) IMPLANT
CATH FOLEY 2WAY SLVR 5CC 20FR (CATHETERS) ×1 IMPLANT
CLOTH BEACON ORANGE TIMEOUT ST (SAFETY) ×3 IMPLANT
GLOVE BIO SURGEON STRL SZ 6.5 (GLOVE) ×2 IMPLANT
GLOVE BIO SURGEON STRL SZ8 (GLOVE) ×3 IMPLANT
GLOVE BIO SURGEONS STRL SZ 6.5 (GLOVE) ×2
GLOVE BIOGEL PI IND STRL 6.5 (GLOVE) IMPLANT
GLOVE BIOGEL PI INDICATOR 6.5 (GLOVE) ×2
GOWN STRL REUS W/TWL LRG LVL3 (GOWN DISPOSABLE) ×7 IMPLANT
GOWN STRL REUS W/TWL XL LVL3 (GOWN DISPOSABLE) ×3 IMPLANT
HOLDER FOLEY CATH W/STRAP (MISCELLANEOUS) ×2 IMPLANT
IV NS 1000ML (IV SOLUTION) ×3
IV NS 1000ML BAXH (IV SOLUTION) ×1 IMPLANT
MANIFOLD NEPTUNE II (INSTRUMENTS) ×3 IMPLANT
PACK CYSTO (CUSTOM PROCEDURE TRAY) ×3 IMPLANT
SYSTEM UROLIFT (Male Continence) ×14 IMPLANT
TUBE CONNECTING 12'X1/4 (SUCTIONS) ×1
TUBE CONNECTING 12X1/4 (SUCTIONS) ×2 IMPLANT
WATER STERILE IRR 3000ML UROMA (IV SOLUTION) ×2 IMPLANT

## 2016-09-02 NOTE — Anesthesia Procedure Notes (Signed)
Procedure Name: LMA Insertion Date/Time: 09/02/2016 9:06 AM Performed by: Duane Boston Pre-anesthesia Checklist: Patient identified, Emergency Drugs available, Suction available and Patient being monitored Patient Re-evaluated:Patient Re-evaluated prior to inductionOxygen Delivery Method: Circle system utilized Preoxygenation: Pre-oxygenation with 100% oxygen Intubation Type: IV induction Ventilation: Mask ventilation without difficulty LMA: LMA inserted LMA Size: 5.0 Number of attempts: 1 Airway Equipment and Method: Bite block Placement Confirmation: positive ETCO2 Tube secured with: Tape Dental Injury: Teeth and Oropharynx as per pre-operative assessment

## 2016-09-02 NOTE — Anesthesia Preprocedure Evaluation (Addendum)
Anesthesia Evaluation  Patient identified by MRN, date of birth, ID band  Airway Mallampati: II  TM Distance: >3 FB Neck ROM: Full    Dental  (+) Upper Dentures, Lower Dentures, Dental Advisory Given   Pulmonary neg pulmonary ROS,    Pulmonary exam normal        Cardiovascular hypertension, Normal cardiovascular exam  Study Conclusions  - Left ventricle: The cavity size was normal. Wall thickness was   increased in a pattern of mild LVH. Systolic function was normal.   The estimated ejection fraction was in the range of 55% to 60%.   Images were inadequate for LV wall motion assessment. However, no   gross regional variation on apical 4-chamber views. There was an   increased relative contribution of atrial contraction to   ventricular filling. - Aortic valve: Mildly thickened leaflets. There was no stenosis.   Neuro/Psych Dementia  negative psych ROS   GI/Hepatic negative GI ROS, Neg liver ROS,   Endo/Other  diabetes  Renal/GU      Musculoskeletal   Abdominal   Peds  Hematology negative hematology ROS (+)   Anesthesia Other Findings   Reproductive/Obstetrics                            Anesthesia Physical Anesthesia Plan  ASA: III  Anesthesia Plan: General   Post-op Pain Management:    Induction: Intravenous  Airway Management Planned: LMA  Additional Equipment:   Intra-op Plan:   Post-operative Plan: Extubation in OR  Informed Consent: I have reviewed the patients History and Physical, chart, labs and discussed the procedure including the risks, benefits and alternatives for the proposed anesthesia with the patient or authorized representative who has indicated his/her understanding and acceptance.   Dental advisory given and Consent reviewed with POA  Plan Discussed with: CRNA, Anesthesiologist and Surgeon  Anesthesia Plan Comments:        Anesthesia Quick  Evaluation

## 2016-09-02 NOTE — Anesthesia Postprocedure Evaluation (Signed)
Anesthesia Post Note  Patient: Samuel Ryan  Procedure(s) Performed: Procedure(s) (LRB): CYSTOSCOPY WITH INSERTION OF UROLIFT (N/A)  Patient location during evaluation: PACU Anesthesia Type: General Level of consciousness: sedated Pain management: pain level controlled Vital Signs Assessment: post-procedure vital signs reviewed and stable Respiratory status: spontaneous breathing and respiratory function stable Cardiovascular status: stable Anesthetic complications: no    Last Vitals:  Vitals:   09/02/16 0711 09/02/16 0943  BP: (!) 143/90 (!) 156/86  Pulse: 63 70  Resp: 16 18  Temp: 36.8 C 36.9 C    Last Pain:  Vitals:   09/02/16 0711  TempSrc: Oral                 Copelan Maultsby DANIEL

## 2016-09-02 NOTE — H&P (Signed)
Urology Admission H&P  Chief Complaint: urinary retention  History of Present Illness: Mr Samuel Ryan is a 82yo here for Urolift for BPH with LUTS, and urinary retention.   Past Medical History:  Diagnosis Date  . Acute cholecystitis 12/12/2012 to 12/19/2012   Drained percutaneously  . BPH (benign prostatic hypertrophy)   . Deep venous thrombosis ([facility])    Left leg, diagnosed 7/12  . Dementia    mild /needs supervision for safety per son -POA  . Diverticulitis   . Essential hypertension, benign   . History of pneumonia    prior to 2012  . Incontinence of urine    wears depends  . Mixed hyperlipidemia   . Osteoarthritis   . Pulmonary embolism ([place])    Diagnosed 7/12  . Type 2 diabetes mellitus ([place])    Past Surgical History:  Procedure Laterality Date  . CARPAL TUNNEL RELEASE    . COLONOSCOPY  08/31/2012   Procedure: COLONOSCOPY;  Surgeon: Rogene Houston, MD;  Location: [facility];  Service: Endoscopy;  Laterality: N/A;  830  . EYE SURGERY Bilateral    bilateral cataract  . Right total knee replacement  04/23/2011  . SPINE SURGERY  prior to 2012   Neck fusion  . UMBILICAL HERNIA REPAIR     umbilical    Home Medications:  Prescriptions Prior to Admission  Medication Sig Dispense Refill Last Dose  . acetaminophen (TYLENOL) 500 MG tablet Take 1,000 mg by mouth every 6 (six) hours as needed for moderate pain.   Past Week at Unknown time  . donepezil (ARICEPT) 10 MG tablet TAKE 1 TABLET AT BEDTIME 90 tablet 1 09/02/2016 at 0540  . ferrous sulfate 325 (65 FE) MG tablet TAKE 1 TABLET TWICE DAILY (Patient taking differently: TAKE 1 TABLET  DAILY) 180 tablet 3 09/02/2016 at 0540  . fluticasone (FLONASE) 50 MCG/ACT nasal spray Place 2 sprays into both nostrils daily. 16 g 6 Past Week at Unknown time  . glipiZIDE (GLUCOTROL XL) 10 MG 24 hr tablet TAKE TWO TABLETS EVERY MORNING AT BREAKFAST (DOSE INCREASE) 180 tablet 1 09/01/2016 at Unknown time  . Insulin Glargine (LANTUS SOLOSTAR) 100  UNIT/ML Solostar Pen Inject 15 Units into the skin daily at 10 pm. 5 pen PRN 09/01/2016 at Unknown time  . Insulin Pen Needle (PEN NEEDLES) 31G X 5 MM MISC 1 each by Does not apply route as directed. 100 each 1 09/01/2016 at Unknown time  . linagliptin (TRADJENTA) 5 MG TABS tablet Take 1 tablet (5 mg total) by mouth daily. 30 tablet 5 09/01/2016 at Unknown time  . loratadine (CLARITIN) 10 MG tablet Take 10 mg by mouth daily.   09/02/2016 at 0540  . lovastatin (MEVACOR) 40 MG tablet Take 1 tablet (40 mg total) by mouth at bedtime. 90 tablet 1 09/02/2016 at 0540  . sertraline (ZOLOFT) 50 MG tablet TAKE 1 TABLET EVERY DAY 90 tablet 1 09/02/2016 at 0540  . TRUE METRIX BLOOD GLUCOSE TEST test strip TEST TWO TIMES DAILY 200 each 1 09/01/2016 at Unknown time  . Vitamin D, Ergocalciferol, (DRISDOL) 50000 units CAPS capsule TAKE 1 CAPSULE EVERY 7 (SEVEN) DAYS. 4 capsule 0 Past Week at Unknown time  . clobetasol cream (TEMOVATE) [id number] % APPLY 1 APPLICATION TOPICALLY 2 (TWO) TIMES DAILY. 30 g 3 Taking  . omeprazole (PRILOSEC) 40 MG capsule Take 1 capsule (40 mg total) by mouth daily. 90 capsule 1 Unknown at Unknown time  . Polyethylene Glycol 400 (BLINK TEARS OP) Apply 1 drop  to eye daily as needed (dry eyes).   08/31/2016  . tamsulosin (FLOMAX) 0.4 MG CAPS capsule Take 1 capsule (0.4 mg total) by mouth daily. 90 capsule 1 Unknown at Unknown time  . traMADol (ULTRAM) 50 MG tablet Take 1 tablet (50 mg total) by mouth 2 (two) times daily. 180 tablet 1 Unknown at Unknown time   Allergies:  Allergies  Allergen Reactions  . Ace Inhibitors     cough  . Vancomycin Swelling    Facial swelling  . Zosyn [Piperacillin Sod-Tazobactam So] Swelling    Facial swelling    Family History  Problem Relation Age of Onset  . Cancer Mother     Stomach  . Diabetes Sister   . Hypertension Sister   . Diabetes Brother   . Cancer Brother     Gallbladder   Social History:  reports that he has never smoked. He has never used  smokeless tobacco. He reports that he does not drink alcohol or use drugs.  Review of Systems  All other systems reviewed and are negative.   Physical Exam:  Vital signs in last 24 hours: Temp:  [98.2 F (36.8 C)] 98.2 F (36.8 C) (10/05 0711) Pulse Rate:  [63] 63 (10/05 0711) Resp:  [16] 16 (10/05 0711) BP: (143)/(90) 143/90 (10/05 0711) SpO2:  [97 %] 97 % (10/05 0711) Weight:  [117.5 kg (259 lb)] 117.5 kg (259 lb) (10/05 0711) Physical Exam  Constitutional: He is oriented to person, place, and time. He appears well-developed and well-nourished.  HENT:  Head: Normocephalic and atraumatic.  Eyes: EOM are normal. Pupils are equal, round, and reactive to light.  Neck: Normal range of motion. No thyromegaly present.  Cardiovascular: Normal rate and regular rhythm.   Respiratory: Effort normal. No respiratory distress.  GI: Soft. He exhibits no distension.  Musculoskeletal: Normal range of motion. He exhibits no edema.  Neurological: He is alert and oriented to person, place, and time.  Skin: Skin is warm and dry.  Psychiatric: He has a normal mood and affect. His behavior is normal. Judgment and thought content normal.    Laboratory Data:  Results for orders placed or performed during the hospital encounter of 09/02/16 (from the past 24 hour(s))  I-STAT 4, (NA,K, GLUC, HGB,HCT)     Status: Abnormal   Collection Time: 09/02/16  7:40 AM  Result Value Ref Range   Sodium 144 135 - 145 mmol/L   Potassium 4.6 3.5 - 5.1 mmol/L   Glucose, Bld 106 (H) 65 - 99 mg/dL   HCT 36.0 (L) 39.0 - 52.0 %   Hemoglobin 12.2 (L) 13.0 - 17.0 g/dL   No results found for this or any previous visit (from the past 240 hour(s)). Creatinine: No results for input(s): CREATININE in the last 168 hours. Baseline Creatinine: unknwon  Impression/Assessment:  82yo with BPh with LUTS, urinary retention  Plan:  The risks/benefits/alternatives to urolift was explained to the patient and he understands and  wishes to proceed with sugery  Nicolette Bang 09/02/2016, 8:51 AM

## 2016-09-02 NOTE — Transfer of Care (Signed)
  Last Vitals:  Vitals:   09/02/16 0711  BP: (!) 143/90  Pulse: 63  Resp: 16  Temp: 36.8 C    Last Pain:  Vitals:   09/02/16 0711  TempSrc: Oral        Immediate Anesthesia Transfer of Care Note  Patient: Samuel Ryan  Procedure(s) Performed: Procedure(s) (LRB): CYSTOSCOPY WITH INSERTION OF UROLIFT (N/A)  Patient Location: PACU  Anesthesia Type: General  Level of Consciousness: awake, alert  and oriented  Airway & Oxygen Therapy: Patient Spontanous Breathing and Patient connected to face mask oxygen  Post-op Assessment: Report given to PACU RN and Post -op Vital signs reviewed and stable  Post vital signs: Reviewed and stable  Complications: No apparent anesthesia complications

## 2016-09-02 NOTE — Discharge Instructions (Addendum)
°Post Anesthesia Home Care Instructions ° °Activity: °Get plenty of rest for the remainder of the day. A responsible adult should stay with you for 24 hours following the procedure.  °For the next 24 hours, DO NOT: °-Drive a car °-Operate machinery °-Drink alcoholic beverages °-Take any medication unless instructed by your physician °-Make any legal decisions or sign important papers. ° °Meals: °Start with liquid foods such as gelatin or soup. Progress to regular foods as tolerated. Avoid greasy, spicy, heavy foods. If nausea and/or vomiting occur, drink only clear liquids until the nausea and/or vomiting subsides. Call your physician if vomiting continues. ° °Special Instructions/Symptoms: °Your throat may feel dry or sore from the anesthesia or the breathing tube placed in your throat during surgery. If this causes discomfort, gargle with warm salt water. The discomfort should disappear within 24 hours. ° °If you had a scopolamine patch placed behind your ear for the management of post- operative nausea and/or vomiting: ° °1. The medication in the patch is effective for 72 hours, after which it should be removed.  Wrap patch in a tissue and discard in the trash. Wash hands thoroughly with soap and water. °2. You may remove the patch earlier than 72 hours if you experience unpleasant side effects which may include dry mouth, dizziness or visual disturbances. °3. Avoid touching the patch. Wash your hands with soap and water after contact with the patch. °  °Foley Catheter Care, Adult °A Foley catheter is a soft, flexible tube. This tube is placed into your bladder to drain pee (urine). If you go home with this catheter in place, follow the instructions below. °TAKING CARE OF THE CATHETER °1. Wash your hands with soap and water. °2. Put soap and water on a clean washcloth. °¨ Clean the skin where the tube goes into your body. °§ Clean away from the tube site. °§ Never wipe toward the tube. °§ Clean the area using a  circular motion. °¨ Remove all the soap. Pat the area dry with a clean towel. For males, reposition the skin that covers the end of the penis (foreskin). °3. Attach the tube to your leg with tape or a leg strap. Do not stretch the tube tight. If you are using tape, remove any stickiness left behind by past tape you used. °4. Keep the drainage bag below your hips. Keep it off the floor. °5. Check your tube during the day. Make sure it is working and draining. Make sure the tube does not curl, twist, or bend. °6. Do not pull on the tube or try to take it out. °TAKING CARE OF THE DRAINAGE BAGS °You will have a large overnight drainage bag and a small leg bag. You may wear the overnight bag any time. Never wear the small bag at night. Follow the directions below. °Emptying the Drainage Bag °Empty your drainage bag when it is  -½ full or at least 2-3 times a day. °1. Wash your hands with soap and water. °2. Keep the drainage bag below your hips. °3. Hold the dirty bag over the toilet or clean container. °4. Open the pour spout at the bottom of the bag. Empty the pee into the toilet or container. Do not let the pour spout touch anything. °5. Clean the pour spout with a gauze pad or cotton ball that has rubbing alcohol on it. °6. Close the pour spout. °7. Attach the bag to your leg with tape or a leg strap. °8. Wash your hands well. °Changing   the Drainage Bag °Change your bag once a month or sooner if it starts to smell or look dirty.  °1. Wash your hands with soap and water. °2. Pinch the rubber tube so that pee does not spill out. °3. Disconnect the catheter tube from the drainage tube at the connection valve. Do not let the tubes touch anything. °4. Clean the end of the catheter tube with an alcohol wipe. Clean the end of a the drainage tube with a different alcohol wipe. °5. Connect the catheter tube to the drainage tube of the clean drainage bag. °6. Attach the new bag to the leg with tape or a leg strap. Avoid  attaching the new bag too tightly. °7. Wash your hands well. °Cleaning the Drainage Bag °1. Wash your hands with soap and water. °2. Wash the bag in warm, soapy water. °3. Rinse the bag with warm water. °4. Fill the bag with a mixture of white vinegar and water (1 cup vinegar to 1 quart warm water [.2 liter vinegar to 1 liter warm water]). Close the bag and soak it for 30 minutes in the solution. °5. Rinse the bag with warm water. °6. Hang the bag to dry with the pour spout open and hanging downward. °7. Store the clean bag (once it is dry) in a clean plastic bag. °8. Wash your hands well. °PREVENT INFECTION °· Wash your hands before and after touching your tube. °· Take showers every day. Wash the skin where the tube enters your body. Do not take baths. Replace wet leg straps with dry ones, if this applies. °· Do not use powders, sprays, or lotions on the genital area. Only use creams, lotions, or ointments as told by your doctor. °· For females, wipe from front to back after going to the bathroom. °· Drink enough fluids to keep your pee clear or pale yellow unless you are told not to have too much fluid (fluid restriction). °· Do not let the drainage bag or tubing touch or lie on the floor. °· Wear cotton underwear to keep the area dry. °GET HELP IF: °· Your pee is cloudy or smells unusually bad. °· Your tube becomes clogged. °· You are not draining pee into the bag or your bladder feels full. °· Your tube starts to leak. °GET HELP RIGHT AWAY IF: °· You have pain, puffiness (swelling), redness, or yellowish-white fluid (pus) where the tube enters the body. °· You have pain in the belly (abdomen), legs, lower back, or bladder. °· You have a fever. °· You see blood fill the tube, or your pee is pink or red. °· You feel sick to your stomach (nauseous), throw up (vomit), or have chills. °· Your tube gets pulled out. °MAKE SURE YOU:  °· Understand these instructions. °· Will watch your condition. °· Will get help right  away if you are not doing well or get worse. °  °This information is not intended to replace advice given to you by your health care provider. Make sure you discuss any questions you have with your health care provider. °  °Document Released: 03/12/2013 Document Revised: 12/06/2014 Document Reviewed: 03/12/2013 °Elsevier Interactive Patient Education ©2016 [employer] ° °

## 2016-09-02 NOTE — Op Note (Signed)
   PREOPERATIVE DIAGNOSIS: Benign prostatic hypertrophy with bladder outlet obstruction, urinary retention  POSTOPERATIVE DIAGNOSIS: Benign prostatic hypertrophy with bladder outlet obstruction, urinary retention.  PROCEDURE: Cystoscopy with implantation of UroLift devices, 6 implants.  SURGEON: Nicolette Bang, M.D.  ANESTHESIA: General  ANTIBIOTICS: Cipro  SPECIMEN: None.  DRAINS: A 16-French Foley catheter.  BLOOD LOSS: Minimal.  COMPLICATIONS: None.  INDICATIONS:The Patient is an 82 year old white male with BPH and bladder outlet obstruction. He has failed medical therapy and has elected UroLift for definitive treatment.  FINDINGS OF PROCEDURE: He was taken to the operating room where a genral anesthetic was induced. He was placed in lithotomy position and was fitted with PAS hose. His perineum and genitalia were prepped with chlorhexidine, and he was draped in usual sterile fashion.  Cystoscopy was performed using the UroLift scope and 0 degree lens. Examination revealed a normal urethra. The external sphincter was intact. Prostatic urethra was approximately 5 cm in length with lateral lobe enlargement. There was also little bit of bladder neck elevation. Inspection of bladder revealed mild-to-moderate trabeculation with no tumors, stones, or inflammation. No cellules or diverticula were noted. Ureteral orifices were in their normal anatomic position effluxing clear urine.  After initial cystoscopy, the visual obturator was replaced with the first UroLift device. This was turned to the 9 o'clock position and pulled back to the veru and then slightly advanced. Pressure was then applied to the right lateral lobe and the UroLift device was deployed.  The second UroLift device was then inserted and applied to the left lateral lobe at 3 o'clock and deployed in the mid prostatic urethra. After this, there was still some apparent obstruction closer  to the bladder neck. So a second level of UroLift your left device was applied between the mid urethra and the proximal urethra providing further patency to the prostatic urethra. We then placed a third set of implants at the verumontanum. The scope was removed and a 16-French Foley catheter was inserted without difficulty. The balloon was filled with 10 mL sterile fluid, and the catheter was placed to straight drainage.  COMPLICATIONS: None   CONDITION: Stable, extubated, transferred to PACU  PLAN: The patient will be discharged home and followup in 1 days for a voiding trial.

## 2016-09-03 ENCOUNTER — Ambulatory Visit: Payer: Self-pay | Admitting: Family Medicine

## 2016-09-03 ENCOUNTER — Encounter (HOSPITAL_BASED_OUTPATIENT_CLINIC_OR_DEPARTMENT_OTHER): Payer: Self-pay | Admitting: Urology

## 2016-09-03 DIAGNOSIS — N401 Enlarged prostate with lower urinary tract symptoms: Secondary | ICD-10-CM | POA: Diagnosis not present

## 2016-09-03 DIAGNOSIS — R351 Nocturia: Secondary | ICD-10-CM | POA: Diagnosis not present

## 2016-09-10 ENCOUNTER — Encounter (HOSPITAL_COMMUNITY): Payer: Self-pay | Admitting: *Deleted

## 2016-09-10 ENCOUNTER — Other Ambulatory Visit (HOSPITAL_COMMUNITY)
Admission: RE | Admit: 2016-09-10 | Discharge: 2016-09-10 | Disposition: A | Discharge status: Home / Self Care | Payer: Commercial Managed Care - HMO | Source: Ambulatory Visit | Attending: Urology | Admitting: Urology

## 2016-09-10 ENCOUNTER — Emergency Department (HOSPITAL_COMMUNITY)
Admission: EM | Admit: 2016-09-10 | Discharge: 2016-09-10 | Disposition: A | Discharge status: Home / Self Care | Payer: Commercial Managed Care - HMO | Attending: Emergency Medicine | Admitting: Emergency Medicine

## 2016-09-10 DIAGNOSIS — I10 Essential (primary) hypertension: Secondary | ICD-10-CM | POA: Insufficient documentation

## 2016-09-10 DIAGNOSIS — R310 Gross hematuria: Secondary | ICD-10-CM

## 2016-09-10 DIAGNOSIS — Z794 Long term (current) use of insulin: Secondary | ICD-10-CM | POA: Insufficient documentation

## 2016-09-10 DIAGNOSIS — Z79899 Other long term (current) drug therapy: Secondary | ICD-10-CM | POA: Diagnosis not present

## 2016-09-10 DIAGNOSIS — R300 Dysuria: Secondary | ICD-10-CM | POA: Diagnosis not present

## 2016-09-10 DIAGNOSIS — E119 Type 2 diabetes mellitus without complications: Secondary | ICD-10-CM | POA: Diagnosis not present

## 2016-09-10 DIAGNOSIS — R319 Hematuria, unspecified: Secondary | ICD-10-CM | POA: Diagnosis present

## 2016-09-10 DIAGNOSIS — R3129 Other microscopic hematuria: Secondary | ICD-10-CM | POA: Diagnosis not present

## 2016-09-10 LAB — I-STAT CHEM 8, ED
BUN: 17 mg/dL (ref 6–20)
CALCIUM ION: 1.14 mmol/L — AB (ref 1.15–1.40)
CHLORIDE: 104 mmol/L (ref 101–111)
CREATININE: 1.8 mg/dL — AB (ref 0.61–1.24)
GLUCOSE: 108 mg/dL — AB (ref 65–99)
HCT: 35 % — ABNORMAL LOW (ref 39.0–52.0)
Hemoglobin: 11.9 g/dL — ABNORMAL LOW (ref 13.0–17.0)
Potassium: 4 mmol/L (ref 3.5–5.1)
Sodium: 141 mmol/L (ref 135–145)
TCO2: 24 mmol/L (ref 0–100)

## 2016-09-10 LAB — URINALYSIS, ROUTINE W REFLEX MICROSCOPIC
BILIRUBIN URINE: NEGATIVE
GLUCOSE, UA: NEGATIVE mg/dL
Ketones, ur: NEGATIVE mg/dL
Leukocytes, UA: NEGATIVE
Nitrite: NEGATIVE
PH: 6.5 (ref 5.0–8.0)
Protein, ur: NEGATIVE mg/dL
SPECIFIC GRAVITY, URINE: 1.006 (ref 1.005–1.030)

## 2016-09-10 LAB — URINE MICROSCOPIC-ADD ON
BACTERIA UA: NONE SEEN
Squamous Epithelial / LPF: NONE SEEN
WBC, UA: NONE SEEN WBC/hpf (ref 0–5)

## 2016-09-10 LAB — CBG MONITORING, ED: GLUCOSE-CAPILLARY: 86 mg/dL (ref 65–99)

## 2016-09-10 NOTE — ED Provider Notes (Signed)
[facility] DEPT Provider Note   CSN: [id number] Arrival date & time: 09/10/16  1752  By signing my name below, I, Samuel Ryan, attest that this documentation has been prepared under the direction and in the presence of [employer], PA-C. Electronically Signed: Gwenlyn Ryan, ED Scribe. 09/10/16. 9:04 PM.   History   Chief Complaint Chief Complaint  Patient presents with  . Hematuria   The history is provided by the patient and a relative. No language interpreter was used.    HPI Comments: Samuel Ryan is a 82 y.o. male with PMHx of Cholecystitis, PE/DVT, Dementia, DM who presents to the Emergency Department complaining of gradual onset, episodic hematuria s/p Urolift surgery on 09/02/2016. He had approximately 2 days without any episodes a few days ago. Pt was seen at [facility] in [place] today where he had a large amount of blood in his urine. The urology office advised pt be seen at [facility]. He has been feeling well after surgery. He states he has pain when he passes clots. He states he has baseline shortness of breath, but it has not been worse than normal. He does not currently take any blood thinners. Pt had normal [place] of bladder recently. Pt denies fever, nausea, vomiting, abdominal pain, lightheadedness, shortness of breath.   Past Medical History:  Diagnosis Date  . Acute cholecystitis 12/12/2012 to 12/19/2012   Drained percutaneously  . BPH (benign prostatic hypertrophy)   . Deep venous thrombosis (HCC)    Left leg, diagnosed 7/12  . Dementia    mild /needs supervision for safety per son -POA  . Diverticulitis   . Essential hypertension, benign   . History of pneumonia    prior to 2012  . Incontinence of urine    wears depends  . Mixed hyperlipidemia   . Osteoarthritis   . Pulmonary embolism ([place])    Diagnosed 7/12  . Type 2 diabetes mellitus [facility])     Patient Active Problem List   Diagnosis Date Noted  . Urinary frequency 09/17/2015  . At high risk  for falls 03/30/2015  . Generalized osteoarthritis 07/16/2014  . Urinary incontinence 07/16/2014  . Pulmonary interstitial fibrosis ([place]) 07/03/2014  . Abnormal CXR (chest x-ray) 05/27/2014  . Thyroid mass of unclear etiology 05/07/2014  . Vitamin D deficiency 01/31/2014  . Allergic rhinitis 01/31/2014  . Depression due to dementia 02/25/2013  . Diverticulosis of sigmoid colon 12/13/2012  . Right ventricular dysfunction 07/20/2011  . Heart disease, unspecified 07/20/2011  . Dementia of the Alzheimer's type without behavioral disturbance 09/12/2010  . Goiter 06/19/2009  . Essential hypertension 12/11/2008  . Type 2 diabetes, uncontrolled, with renal manifestation ([place]) 09/05/2008  . Hyperlipidemia with target LDL less than 100 07/19/2008  . BENIGN PROSTATIC HYPERTROPHY, HX OF 03/28/2008    Past Surgical History:  Procedure Laterality Date  . CARPAL TUNNEL RELEASE    . COLONOSCOPY  08/31/2012   Procedure: COLONOSCOPY;  Surgeon: Rogene Houston, MD;  Location: [facility];  Service: Endoscopy;  Laterality: N/A;  830  . CYSTOSCOPY WITH INSERTION OF UROLIFT N/A 09/02/2016   Procedure: CYSTOSCOPY WITH INSERTION OF UROLIFT;  Surgeon: Cleon Gustin, MD;  Location: [facility];  Service: Urology;  Laterality: N/A;  . EYE SURGERY Bilateral    bilateral cataract  . Right total knee replacement  04/23/2011  . SPINE SURGERY  prior to 2012   Neck fusion  . UMBILICAL HERNIA REPAIR     umbilical     Home  Medications    Prior to Admission medications   Medication Sig Start Date End Date Taking? Authorizing Provider  acetaminophen (TYLENOL) 500 MG tablet Take 1,000 mg by mouth every 6 (six) hours as needed for moderate pain.   Yes Historical Provider, MD  benazepril (LOTENSIN) 5 MG tablet Take 5 mg by mouth every morning. 09/06/16  Yes Historical Provider, MD  clobetasol cream (TEMOVATE) [id number] % APPLY 1 APPLICATION TOPICALLY 2 (TWO) TIMES DAILY. Patient taking differently:  Apply 1 application topically 2 (two) times daily as needed (dry skin). APPLY 1 APPLICATION TOPICALLY 2 (TWO) TIMES DAILY. 07/22/16  Yes [name], MD  donepezil (ARICEPT) 10 MG tablet TAKE 1 TABLET AT BEDTIME Patient taking differently: Take 10 mg by mouth at bedtime 04/29/16  Yes [name], MD  ferrous sulfate 325 (65 FE) MG tablet TAKE 1 TABLET TWICE DAILY Patient taking differently: TAKE 1 TABLET  DAILY 05/23/15  Yes [name], MD  fluticasone (FLONASE) 50 MCG/ACT nasal spray Place 2 sprays into both nostrils daily. Patient taking differently: Place 2 sprays into both nostrils daily as needed for allergies.  02/09/16  Yes [name], MD  glipiZIDE (GLUCOTROL XL) 10 MG 24 hr tablet TAKE TWO TABLETS EVERY MORNING AT BREAKFAST (DOSE INCREASE) Patient taking differently: Take 20 mg by mouth daily with breakfast.  06/25/16  Yes [name], MD  Insulin Glargine (LANTUS SOLOSTAR) 100 UNIT/ML Solostar Pen Inject 15 Units into the skin daily at 10 pm. Patient taking differently: Inject 15 Units into the skin every morning.  08/16/16  Yes [name], MD  Insulin Pen Needle (PEN NEEDLES) 31G X 5 MM MISC 1 each by Does not apply route as directed. 07/13/16  Yes [name], MD  linagliptin (TRADJENTA) 5 MG TABS tablet Take 1 tablet (5 mg total) by mouth daily. 08/16/16  Yes [name], MD  loratadine (CLARITIN) 10 MG tablet Take 10 mg by mouth every morning.    Yes Historical Provider, MD  lovastatin (MEVACOR) 40 MG tablet Take 1 tablet (40 mg total) by mouth at bedtime. 06/25/16  Yes [name], MD  omeprazole (PRILOSEC) 40 MG capsule Take 1 capsule (40 mg total) by mouth daily. Patient taking differently: Take 40 mg by mouth every morning.  06/25/16  Yes [name], MD  Polyethylene Glycol 400 (BLINK TEARS OP) Apply 1 drop to eye daily as needed (dry eyes).   Yes Historical Provider, MD  sertraline (ZOLOFT) 50 MG tablet TAKE 1 TABLET  EVERY DAY Patient taking differently: Take 50 mg by mouth daily 04/29/16  Yes [name], MD  tamsulosin (FLOMAX) 0.4 MG CAPS capsule Take 1 capsule (0.4 mg total) by mouth daily. Patient taking differently: Take 0.4 mg by mouth every morning.  06/25/16  Yes [name], MD  TRUE METRIX BLOOD GLUCOSE TEST test strip TEST TWO TIMES DAILY 04/30/16  Yes [name], MD  Vitamin D, Ergocalciferol, (DRISDOL) 50000 units CAPS capsule TAKE 1 CAPSULE EVERY 7 (SEVEN) DAYS. 08/20/16  Yes Raylene Everts, MD  traMADol (ULTRAM) 50 MG tablet Take 1 tablet (50 mg total) by mouth 2 (two) times daily. Patient not taking: Reported on 09/10/2016 06/25/16   [name], MD    Family History Family History  Problem Relation Age of Onset  . Cancer Mother     Stomach  . Diabetes Sister   . Hypertension Sister   . Diabetes Brother   . Cancer  Brother     Gallbladder    Social History Social History  Substance Use Topics  . Smoking status: Never Smoker  . Smokeless tobacco: Never Used  . Alcohol use No     Allergies   Ace inhibitors; Vancomycin; and Zosyn [piperacillin sod-tazobactam so]   Review of Systems Review of Systems  Constitutional: Negative for fever.  Respiratory: Negative for shortness of breath.   Gastrointestinal: Negative for abdominal pain, nausea and vomiting.  Genitourinary: Positive for dysuria and hematuria. Negative for difficulty urinating.  Neurological: Negative for light-headedness.  Ten systems reviewed and are negative for acute change, except as noted in the HPI.     Physical Exam Updated Vital Signs BP 164/90 (BP Location: Right Arm)   Pulse 67   Temp 98.2 F (36.8 C) (Oral)   Resp 17   Ht 5\' 10"  (1.778 m)   Wt 117.5 kg   SpO2 95%   BMI 37.16 kg/m   Physical Exam  Constitutional: He is oriented to person, place, and time. He appears well-developed and well-nourished. He is active. No distress.  Nontoxic appearing and in NAD    HENT:  Head: Normocephalic and atraumatic.  Eyes: Conjunctivae are normal.  Cardiovascular: Normal rate, regular rhythm and intact distal pulses.   Pulmonary/Chest: Effort normal. No respiratory distress. He has no wheezes.  Respirations even and unlabored  Abdominal: Soft. There is no tenderness.  Soft, obese, nontender abdomen.  Genitourinary:  Genitourinary Comments: No blood noted at urethral meatus. Uncircumcised male without penile swelling or other discharge. No associated TTP. Chaperone (scribe) was present for exam which was performed with no discomfort or complications.   Neurological: He is alert and oriented to person, place, and time. He exhibits normal muscle tone. Coordination normal.  Skin: Skin is warm and dry. He is not diaphoretic.  Psychiatric: He has a normal mood and affect. His behavior is normal.  Nursing note and vitals reviewed.   ED Treatments / Results  DIAGNOSTIC STUDIES: Oxygen Saturation is 94% on RA, adequate by my interpretation.    COORDINATION OF CARE: 8:55 PM Discussed treatment plan with pt at bedside which includes lab work and pt agreed to plan.  Labs (all labs ordered are listed, but only abnormal results are displayed) Labs Reviewed  URINALYSIS, ROUTINE W REFLEX MICROSCOPIC (NOT AT [facility]) - Abnormal; Notable for the following:       Result Value   Hgb urine dipstick MODERATE (*)    All other components within normal limits  I-STAT CHEM 8, ED - Abnormal; Notable for the following:    Creatinine, Ser 1.80 (*)    Glucose, Bld 108 (*)    Calcium, Ion 1.14 (*)    Hemoglobin 11.9 (*)    HCT 35.0 (*)    All other components within normal limits  URINE MICROSCOPIC-ADD ON  CBG MONITORING, ED    EKG  EKG Interpretation None       Radiology No results found.  Procedures Procedures (including critical care time)  Medications Ordered in ED Medications - No data to display   Initial Impression / Assessment and Plan / ED Course  I  have reviewed the triage vital signs and the nursing notes.  Pertinent labs & imaging results that were available during my care of the patient were reviewed by me and considered in my medical decision making (see chart for details).  Clinical Course    9:45 PM Case discussed with Dr. Lenord Fellers of [facility]. He requests a PVR.  If PVR is <600 and not suggestive of clot retention, Dr. Lenord Fellers feels comfortable with discharge; especially in the setting of hemodynamic stability and stable hemoglobin. UA does not suggest infection today.  10:37 PM PVR 135cc. No evidence of obstruction secondary to clots.  11:00 PM Patient states that he is feeling much better. I have discussed with the patient and his son his reassuring workup today. Patient is hemodynamically stable. He has been instructed to return to [facility] for follow-up. Return precautions discussed and provided. Patient discharged in stable condition. Patient and son with no unaddressed concerns.   Final Clinical Impressions(s) / ED Diagnoses   Final diagnoses:  Microscopic hematuria    New Prescriptions Discharge Medication List as of 09/10/2016 11:06 PM      I personally performed the services described in this documentation, which was scribed in my presence. The recorded information has been reviewed and is accurate.      Antonietta Breach, PA-C 09/11/16 0134    Charlesetta Shanks, MD 09/11/16 1344

## 2016-09-10 NOTE — Discharge Instructions (Signed)
Your bleeding does not appear to be concerning. Your hemoglobin is normal. Your kidney function is stable compared to prior visits. Do not appear to have a blockage in your urethra, preventing urine from leaving your bladder. Follow up with [facility] given your visit today. You may return for any new or concerning symptoms. Bleeding should continue to subside following your recent procedure.

## 2016-09-10 NOTE — ED Triage Notes (Signed)
Pt reports blood in urine for the past 8 days since having Urolift surgery. Pt went to [facility]'s [place] office today and had large amount of blood in urine and was told to come to [facility].  Pt denies pain at this time.

## 2016-09-12 LAB — URINE CULTURE: Culture: NO GROWTH

## 2016-09-15 ENCOUNTER — Ambulatory Visit (INDEPENDENT_AMBULATORY_CARE_PROVIDER_SITE_OTHER): Payer: Self-pay | Admitting: Urology

## 2016-09-15 DIAGNOSIS — Z9889 Other specified postprocedural states: Secondary | ICD-10-CM

## 2016-09-17 ENCOUNTER — Other Ambulatory Visit: Payer: Self-pay | Admitting: Family Medicine

## 2016-09-20 ENCOUNTER — Telehealth: Payer: Self-pay | Admitting: Family Medicine

## 2016-09-20 ENCOUNTER — Other Ambulatory Visit: Payer: Self-pay

## 2016-09-20 MED ORDER — VITAMIN D (ERGOCALCIFEROL) 1.25 MG (50000 UNIT) PO CAPS: ORAL_CAPSULE | ORAL | 3 refills | Status: DC

## 2016-09-20 MED ORDER — TRAMADOL HCL 50 MG PO TABS: 50.0000 mg | ORAL_TABLET | Freq: Two times a day (BID) | ORAL | 0 refills | Status: DC

## 2016-09-20 MED ORDER — FERROUS SULFATE 325 (65 FE) MG PO TABS: 325.0000 mg | ORAL_TABLET | Freq: Two times a day (BID) | ORAL | 1 refills | Status: DC

## 2016-09-20 MED ORDER — OMEPRAZOLE 40 MG PO CPDR: 40.0000 mg | DELAYED_RELEASE_CAPSULE | Freq: Every day | ORAL | 1 refills | Status: DC

## 2016-09-20 NOTE — Telephone Encounter (Signed)
Samuel Ryan is needing these Rxs sent to mail order, traMADol (ULTRAM) 50 MG tablet , ferrous sulfate 325 (65 FE) MG tablet ,omeprazole (PRILOSEC) 40 MG capsule

## 2016-09-20 NOTE — Telephone Encounter (Signed)
Medications refilled

## 2016-10-20 ENCOUNTER — Ambulatory Visit (INDEPENDENT_AMBULATORY_CARE_PROVIDER_SITE_OTHER): Payer: Commercial Managed Care - HMO | Admitting: Urology

## 2016-10-20 DIAGNOSIS — R3915 Urgency of urination: Secondary | ICD-10-CM

## 2016-10-20 DIAGNOSIS — R351 Nocturia: Secondary | ICD-10-CM | POA: Diagnosis not present

## 2016-10-25 ENCOUNTER — Telehealth: Payer: Self-pay | Admitting: Family Medicine

## 2016-10-25 NOTE — Telephone Encounter (Signed)
Pt needs hBA1C , cmp and EGFR, tSH and vit D Dec 15 or shortly after and needs oV in December after labs are available, please arrange, no appt in system ,i also tried to call him no answer

## 2016-10-28 ENCOUNTER — Encounter (HOSPITAL_COMMUNITY): Payer: Self-pay | Admitting: *Deleted

## 2016-10-28 ENCOUNTER — Observation Stay (HOSPITAL_COMMUNITY)
Admission: EM | Admit: 2016-10-28 | Discharge: 2016-10-30 | Disposition: A | Discharge status: Home / Self Care | Payer: Commercial Managed Care - HMO | Attending: Family Medicine | Admitting: Family Medicine

## 2016-10-28 ENCOUNTER — Emergency Department (HOSPITAL_COMMUNITY): Payer: Commercial Managed Care - HMO

## 2016-10-28 DIAGNOSIS — R05 Cough: Secondary | ICD-10-CM | POA: Diagnosis not present

## 2016-10-28 DIAGNOSIS — R600 Localized edema: Secondary | ICD-10-CM | POA: Insufficient documentation

## 2016-10-28 DIAGNOSIS — E782 Mixed hyperlipidemia: Secondary | ICD-10-CM | POA: Diagnosis not present

## 2016-10-28 DIAGNOSIS — Z794 Long term (current) use of insulin: Secondary | ICD-10-CM | POA: Diagnosis not present

## 2016-10-28 DIAGNOSIS — Z800 Family history of malignant neoplasm of digestive organs: Secondary | ICD-10-CM | POA: Insufficient documentation

## 2016-10-28 DIAGNOSIS — M159 Polyosteoarthritis, unspecified: Secondary | ICD-10-CM | POA: Diagnosis not present

## 2016-10-28 DIAGNOSIS — Z8719 Personal history of other diseases of the digestive system: Secondary | ICD-10-CM | POA: Insufficient documentation

## 2016-10-28 DIAGNOSIS — F329 Major depressive disorder, single episode, unspecified: Secondary | ICD-10-CM | POA: Insufficient documentation

## 2016-10-28 DIAGNOSIS — R32 Unspecified urinary incontinence: Secondary | ICD-10-CM | POA: Diagnosis not present

## 2016-10-28 DIAGNOSIS — F0280 Dementia in other diseases classified elsewhere without behavioral disturbance: Secondary | ICD-10-CM | POA: Diagnosis not present

## 2016-10-28 DIAGNOSIS — J189 Pneumonia, unspecified organism: Secondary | ICD-10-CM | POA: Diagnosis not present

## 2016-10-28 DIAGNOSIS — N401 Enlarged prostate with lower urinary tract symptoms: Secondary | ICD-10-CM | POA: Diagnosis not present

## 2016-10-28 DIAGNOSIS — Z888 Allergy status to other drugs, medicaments and biological substances status: Secondary | ICD-10-CM | POA: Insufficient documentation

## 2016-10-28 DIAGNOSIS — N183 Chronic kidney disease, stage 3 (moderate): Secondary | ICD-10-CM | POA: Diagnosis not present

## 2016-10-28 DIAGNOSIS — J9801 Acute bronchospasm: Secondary | ICD-10-CM | POA: Diagnosis not present

## 2016-10-28 DIAGNOSIS — R350 Frequency of micturition: Secondary | ICD-10-CM | POA: Diagnosis not present

## 2016-10-28 DIAGNOSIS — E1122 Type 2 diabetes mellitus with diabetic chronic kidney disease: Secondary | ICD-10-CM | POA: Insufficient documentation

## 2016-10-28 DIAGNOSIS — J8410 Pulmonary fibrosis, unspecified: Secondary | ICD-10-CM | POA: Diagnosis present

## 2016-10-28 DIAGNOSIS — Z881 Allergy status to other antibiotic agents status: Secondary | ICD-10-CM | POA: Diagnosis not present

## 2016-10-28 DIAGNOSIS — Z86711 Personal history of pulmonary embolism: Secondary | ICD-10-CM | POA: Diagnosis not present

## 2016-10-28 DIAGNOSIS — Z880 Allergy status to penicillin: Secondary | ICD-10-CM | POA: Diagnosis not present

## 2016-10-28 DIAGNOSIS — G309 Alzheimer's disease, unspecified: Secondary | ICD-10-CM | POA: Diagnosis not present

## 2016-10-28 DIAGNOSIS — J84112 Idiopathic pulmonary fibrosis: Secondary | ICD-10-CM | POA: Diagnosis not present

## 2016-10-28 DIAGNOSIS — Z833 Family history of diabetes mellitus: Secondary | ICD-10-CM | POA: Insufficient documentation

## 2016-10-28 DIAGNOSIS — I129 Hypertensive chronic kidney disease with stage 1 through stage 4 chronic kidney disease, or unspecified chronic kidney disease: Secondary | ICD-10-CM | POA: Insufficient documentation

## 2016-10-28 DIAGNOSIS — E559 Vitamin D deficiency, unspecified: Secondary | ICD-10-CM | POA: Diagnosis not present

## 2016-10-28 DIAGNOSIS — R0902 Hypoxemia: Secondary | ICD-10-CM | POA: Diagnosis present

## 2016-10-28 DIAGNOSIS — I10 Essential (primary) hypertension: Secondary | ICD-10-CM | POA: Diagnosis present

## 2016-10-28 LAB — CBC WITH DIFFERENTIAL/PLATELET
BASOS ABS: 0 10*3/uL (ref 0.0–0.1)
BASOS PCT: 0 %
Eosinophils Absolute: 0.2 10*3/uL (ref 0.0–0.7)
Eosinophils Relative: 3 %
HEMATOCRIT: 33.6 % — AB (ref 39.0–52.0)
HEMOGLOBIN: 10.8 g/dL — AB (ref 13.0–17.0)
LYMPHS PCT: 31 %
Lymphs Abs: 1.7 10*3/uL (ref 0.7–4.0)
MCH: 30.1 pg (ref 26.0–34.0)
MCHC: 32.1 g/dL (ref 30.0–36.0)
MCV: 93.6 fL (ref 78.0–100.0)
MONO ABS: 0.4 10*3/uL (ref 0.1–1.0)
Monocytes Relative: 8 %
NEUTROS ABS: 3.2 10*3/uL (ref 1.7–7.7)
NEUTROS PCT: 58 %
Platelets: 161 10*3/uL (ref 150–400)
RBC: 3.59 MIL/uL — AB (ref 4.22–5.81)
RDW: 13.3 % (ref 11.5–15.5)
WBC: 5.6 10*3/uL (ref 4.0–10.5)

## 2016-10-28 LAB — COMPREHENSIVE METABOLIC PANEL
ALBUMIN: 3.3 g/dL — AB (ref 3.5–5.0)
ALK PHOS: 55 U/L (ref 38–126)
ALT: 13 U/L — AB (ref 17–63)
AST: 18 U/L (ref 15–41)
Anion gap: 7 (ref 5–15)
BILIRUBIN TOTAL: 0.4 mg/dL (ref 0.3–1.2)
BUN: 18 mg/dL (ref 6–20)
CO2: 26 mmol/L (ref 22–32)
CREATININE: 2.06 mg/dL — AB (ref 0.61–1.24)
Calcium: 8.1 mg/dL — ABNORMAL LOW (ref 8.9–10.3)
Chloride: 105 mmol/L (ref 101–111)
GFR calc Af Amer: 33 mL/min — ABNORMAL LOW (ref >60)
GFR, EST NON AFRICAN AMERICAN: 28 mL/min — AB (ref >60)
GLUCOSE: 157 mg/dL — AB (ref 65–99)
POTASSIUM: 4.3 mmol/L (ref 3.5–5.1)
Sodium: 138 mmol/L (ref 135–145)
TOTAL PROTEIN: 7.1 g/dL (ref 6.5–8.1)

## 2016-10-28 MED ORDER — ALBUTEROL SULFATE (2.5 MG/3ML) 0.083% IN NEBU: 5.0000 mg | INHALATION_SOLUTION | Freq: Once | RESPIRATORY_TRACT | Status: DC

## 2016-10-28 MED ORDER — IPRATROPIUM BROMIDE 0.02 % IN SOLN: 0.5000 mg | Freq: Once | RESPIRATORY_TRACT | Status: DC

## 2016-10-28 NOTE — ED Triage Notes (Signed)
Pt reports having a cough (yellowish sputum), congestion, chills, and having hot sweats since yesterday.

## 2016-10-28 NOTE — ED Provider Notes (Addendum)
[name] DEPT Provider Note   CSN: [id number] Arrival date & time: 10/28/16  2236  By signing my name below, I, Higinio Plan, attest that this documentation has been prepared under the direction and in the presence of Rolland Porter, MD . Electronically Signed: Higinio Plan, Scribe. 10/29/2016. 12:31 AM.  Time seen 23:52 PM  History   Chief Complaint Chief Complaint  Patient presents with  . Cough   The history is provided by the patient and a relative. No language interpreter was used.   HPI Comments: Samuel Ryan is a 82 y.o. male with PMHx of Type II DM, pneumonia, PE and dementia, who presents to the Emergency Department with his son complaining of gradually worsening, cough productive of yellow sputum that began 2 nights ago and got worse tonight. Pt reports associated subjective fever, congestion with green sputum production, weakness, leg swelling, diaphoresis, shortness of breath, wheezing and rhinorrhea with clear drainage. Pt's son notes he gave pt Mucinex at ~8:30 PM and Alka Seltzer Plus at ~9:00 PM this evening. He states pt does not use a nebulizer or inhaler treatment at home and has never received a breathing treatment for his wheezing. Pt states he received his influenza vaccination this year. He denies chills, sore throat, sneezing, nausea, diarrhea, chest pain, sick contacts and recent hospitalization within the past few months. Pt's son reports pt's blood sugar was 79 this morning and his average blood sugar levels are in the 100's. Pt currently lives with his son who is also his caregiver.   PCP Dr Moshe Cipro  Past Medical History:  Diagnosis Date  . Acute cholecystitis 12/12/2012 to 12/19/2012   Drained percutaneously  . BPH (benign prostatic hypertrophy)   . Deep venous thrombosis (HCC)    Left leg, diagnosed 7/12  . Dementia    mild /needs supervision for safety per son -POA  . Diverticulitis   . Essential hypertension, benign   . History of pneumonia    prior to  2012  . Incontinence of urine    wears depends  . Mixed hyperlipidemia   . Osteoarthritis   . Pulmonary embolism ([place])    Diagnosed 7/12  . Type 2 diabetes mellitus [facility])     Patient Active Problem List   Diagnosis Date Noted  . CAP (community acquired pneumonia) 10/29/2016  . Urinary frequency 09/17/2015  . At high risk for falls 03/30/2015  . Generalized osteoarthritis 07/16/2014  . Urinary incontinence 07/16/2014  . Pulmonary interstitial fibrosis ([place]) 07/03/2014  . Abnormal CXR (chest x-ray) 05/27/2014  . Thyroid mass of unclear etiology 05/07/2014  . Vitamin D deficiency 01/31/2014  . Allergic rhinitis 01/31/2014  . Depression due to dementia 02/25/2013  . Diverticulosis of sigmoid colon 12/13/2012  . Right ventricular dysfunction 07/20/2011  . Heart disease, unspecified 07/20/2011  . Dementia of the Alzheimer's type without behavioral disturbance 09/12/2010  . Goiter 06/19/2009  . Essential hypertension 12/11/2008  . Type 2 diabetes, uncontrolled, with renal manifestation ([place]) 09/05/2008  . Hyperlipidemia with target LDL less than 100 07/19/2008  . BENIGN PROSTATIC HYPERTROPHY, HX OF 03/28/2008    Past Surgical History:  Procedure Laterality Date  . CARPAL TUNNEL RELEASE    . COLONOSCOPY  08/31/2012   Procedure: COLONOSCOPY;  Surgeon: Rogene Houston, MD;  [facility];  Service: Endoscopy;  Laterality: N/A;  830  . CYSTOSCOPY WITH INSERTION OF UROLIFT N/A 09/02/2016   Procedure: CYSTOSCOPY WITH INSERTION OF UROLIFT;  Surgeon: Cleon Gustin, MD;  Location: [facility];  Service: Urology;  Laterality: N/A;  . EYE SURGERY Bilateral    bilateral cataract  . Right total knee replacement  04/23/2011  . SPINE SURGERY  prior to 2012   Neck fusion  . UMBILICAL HERNIA REPAIR     umbilical    Home Medications    Prior to Admission medications   Medication Sig Start Date End Date Taking? Authorizing Provider  acetaminophen (TYLENOL) 500  MG tablet Take 1,000 mg by mouth every 6 (six) hours as needed for moderate pain.    Historical Provider, MD  benazepril (LOTENSIN) 5 MG tablet Take 5 mg by mouth every morning. 09/06/16   Historical Provider, MD  clobetasol cream (TEMOVATE) [id number] % APPLY 1 APPLICATION TOPICALLY 2 (TWO) TIMES DAILY. Patient taking differently: Apply 1 application topically 2 (two) times daily as needed (dry skin). APPLY 1 APPLICATION TOPICALLY 2 (TWO) TIMES DAILY. 07/22/16   [name], MD  donepezil (ARICEPT) 10 MG tablet TAKE 1 TABLET AT BEDTIME Patient taking differently: Take 10 mg by mouth at bedtime 04/29/16   [name], MD  ferrous sulfate 325 (65 FE) MG tablet Take 1 tablet (325 mg total) by mouth 2 (two) times daily. 09/20/16   [name], MD  fluticasone (FLONASE) 50 MCG/ACT nasal spray Place 2 sprays into both nostrils daily. Patient taking differently: Place 2 sprays into both nostrils daily as needed for allergies.  02/09/16   [name], MD  glipiZIDE (GLUCOTROL XL) 10 MG 24 hr tablet TAKE TWO TABLETS EVERY MORNING AT BREAKFAST (DOSE INCREASE) Patient taking differently: Take 20 mg by mouth daily with breakfast.  06/25/16   [name], MD  Insulin Glargine (LANTUS SOLOSTAR) 100 UNIT/ML Solostar Pen Inject 15 Units into the skin daily at 10 pm. Patient taking differently: Inject 15 Units into the skin every morning.  08/16/16   [name], MD  Insulin Pen Needle (PEN NEEDLES) 31G X 5 MM MISC 1 each by Does not apply route as directed. 07/13/16   [name], MD  linagliptin (TRADJENTA) 5 MG TABS tablet Take 1 tablet (5 mg total) by mouth daily. 08/16/16   [name], MD  loratadine (CLARITIN) 10 MG tablet Take 10 mg by mouth every morning.     Historical Provider, MD  lovastatin (MEVACOR) 40 MG tablet Take 1 tablet (40 mg total) by mouth at bedtime. 06/25/16   [name], MD  omeprazole (PRILOSEC) 40 MG capsule Take 1 capsule (40 mg total) by  mouth daily. 09/20/16   [name], MD  Polyethylene Glycol 400 (BLINK TEARS OP) Apply 1 drop to eye daily as needed (dry eyes).    Historical Provider, MD  sertraline (ZOLOFT) 50 MG tablet TAKE 1 TABLET EVERY DAY Patient taking differently: Take 50 mg by mouth daily 04/29/16   [name], MD  tamsulosin (FLOMAX) 0.4 MG CAPS capsule Take 1 capsule (0.4 mg total) by mouth daily. Patient taking differently: Take 0.4 mg by mouth every morning.  06/25/16   [name], MD  traMADol (ULTRAM) 50 MG tablet Take 1 tablet (50 mg total) by mouth 2 (two) times daily. 09/20/16   [name], MD  TRUE METRIX BLOOD GLUCOSE TEST test strip TEST TWO TIMES DAILY 04/30/16   [name], MD  Vitamin D, Ergocalciferol, (DRISDOL) 50000 units CAPS capsule TAKE 1 CAPSULE EVERY 7 (SEVEN) DAYS. 09/20/16   [name], MD    Family History Family  History  Problem Relation Age of Onset  . Cancer Mother     Stomach  . Diabetes Sister   . Hypertension Sister   . Diabetes Brother   . Cancer Brother     Gallbladder    Social History Social History  Substance Use Topics  . Smoking status: Never Smoker  . Smokeless tobacco: Never Used  . Alcohol use No  lives with son  Allergies   Ace inhibitors; Vancomycin; and Zosyn [piperacillin sod-tazobactam so]  Review of Systems Review of Systems  Constitutional: Positive for diaphoresis and fever. Negative for chills.  HENT: Positive for congestion and rhinorrhea. Negative for sneezing and sore throat.   Respiratory: Positive for cough, shortness of breath and wheezing.   Cardiovascular: Positive for leg swelling. Negative for chest pain.  Gastrointestinal: Negative for diarrhea and nausea.  Neurological: Positive for weakness.  All other systems reviewed and are negative.  Physical Exam Updated Vital Signs BP 139/78 (BP Location: Left Arm)   Pulse 83   Temp 98 F (36.7 C) (Oral)   Resp 16   Ht 5\' 10"  (1.778 m)   Wt  240 lb (108.9 kg)   SpO2 94%   BMI 34.44 kg/m   Vital signs normal    Physical Exam  Constitutional: He is oriented to person, place, and time. He appears well-developed and well-nourished.  Non-toxic appearance. He does not appear ill. No distress.  HENT:  Head: Normocephalic and atraumatic.  Right Ear: External ear normal.  Left Ear: External ear normal.  Nose: Nose normal. No mucosal edema or rhinorrhea.  Mouth/Throat: Oropharynx is clear and moist and mucous membranes are normal. No dental abscesses or uvula swelling.  Eyes: Conjunctivae and EOM are normal. Pupils are equal, round, and reactive to light.  Neck: Normal range of motion and full passive range of motion without pain. Neck supple.  Cardiovascular: Normal rate, regular rhythm and normal heart sounds.  Exam reveals no gallop and no friction rub.   No murmur heard. Pulmonary/Chest: No respiratory distress. He has wheezes in the right middle field, the right lower field, the left middle field and the left lower field. He has no rhonchi. He has no rales. He exhibits no tenderness and no crepitus.  Coughing frequently, spitting up mucous in a cup, Diminished breath sounds   Abdominal: Soft. Normal appearance and bowel sounds are normal. He exhibits no distension. There is no tenderness. There is no rebound and no guarding.  Musculoskeletal: Normal range of motion. He exhibits edema. He exhibits no tenderness.  Moves all extremities well. 1+ edema of his ankles bilaterally.   Neurological: He is alert and oriented to person, place, and time. He has normal strength. No cranial nerve deficit.  Skin: Skin is warm, dry and intact. No rash noted. No erythema. No pallor.  Psychiatric: He has a normal mood and affect. His speech is normal and behavior is normal. His mood appears not anxious.  Nursing note and vitals reviewed.  ED Treatments / Results  Labs (all labs ordered are listed, but only abnormal results are  displayed) Results for orders placed or performed during the hospital encounter of 10/28/16  CBC with Differential  Result Value Ref Range   WBC 5.6 4.0 - 10.5 K/uL   RBC 3.59 (L) 4.22 - 5.81 MIL/uL   Hemoglobin 10.8 (L) 13.0 - 17.0 g/dL   HCT 33.6 (L) 39.0 - 52.0 %   MCV 93.6 78.0 - 100.0 fL   MCH 30.1 26.0 - 34.0  pg   MCHC 32.1 30.0 - 36.0 g/dL   RDW 13.3 11.5 - 15.5 %   Platelets 161 150 - 400 K/uL   Neutrophils Relative % 58 %   Neutro Abs 3.2 1.7 - 7.7 K/uL   Lymphocytes Relative 31 %   Lymphs Abs 1.7 0.7 - 4.0 K/uL   Monocytes Relative 8 %   Monocytes Absolute 0.4 0.1 - 1.0 K/uL   Eosinophils Relative 3 %   Eosinophils Absolute 0.2 0.0 - 0.7 K/uL   Basophils Relative 0 %   Basophils Absolute 0.0 0.0 - 0.1 K/uL  Comprehensive metabolic panel  Result Value Ref Range   Sodium 138 135 - 145 mmol/L   Potassium 4.3 3.5 - 5.1 mmol/L   Chloride 105 101 - 111 mmol/L   CO2 26 22 - 32 mmol/L   Glucose, Bld 157 (H) 65 - 99 mg/dL   BUN 18 6 - 20 mg/dL   Creatinine, Ser 2.06 (H) 0.61 - 1.24 mg/dL   Calcium 8.1 (L) 8.9 - 10.3 mg/dL   Total Protein 7.1 6.5 - 8.1 g/dL   Albumin 3.3 (L) 3.5 - 5.0 g/dL   AST 18 15 - 41 U/L   ALT 13 (L) 17 - 63 U/L   Alkaline Phosphatase 55 38 - 126 U/L   Total Bilirubin 0.4 0.3 - 1.2 mg/dL   GFR calc non Af Amer 28 (L) >60 mL/min   GFR calc Af Amer 33 (L) >60 mL/min   Anion gap 7 5 - 15  Brain natriuretic peptide  Result Value Ref Range   B Natriuretic Peptide 61.0 0.0 - 100.0 pg/mL  Troponin I  Result Value Ref Range   Troponin I <0.03 <0.03 ng/mL   Laboratory interpretation all normal except Mild worsening of chronic renal insufficiency, hyperglycemia    EKG  EKG Interpretation  Date/Time:  Friday October 29 2016 00:25:35 EST Ventricular Rate:  87 PR Interval:    QRS Duration: 152 QT Interval:  379 QTC Calculation: 456 R Axis:   80 Text Interpretation:  Sinus rhythm 1 degree AV  block Right bundle branch block No significant  change since last tracing 05 Mar 2016 Confirmed by [place]  MD-I, Raisha Brabender ([id number]) on 10/29/2016 12:30:05 AM       Radiology Dg Chest 2 View  Result Date: 10/28/2016 CLINICAL DATA:  Acute onset of productive cough and shortness of breath. Initial encounter. EXAM: CHEST  2 VIEW COMPARISON:  Chest radiograph performed 05/04/2016 FINDINGS: The lungs are well-aerated. Vascular congestion is noted. Increased interstitial markings raise concern for mild interstitial edema, though pneumonia could have a similar appearance. There is no evidence of pleural effusion or pneumothorax. The heart is borderline normal in size. No acute osseous abnormalities are seen. IMPRESSION: Vascular congestion noted. Increased central markings raise concern for mild interstitial edema, though pneumonia could have a similar appearance. Electronically Signed   By: Garald Balding M.D.   On: 10/28/2016 23:33    Procedures Procedures (including critical care time)  Medications Ordered in ED Medications  levofloxacin (LEVAQUIN) IVPB 750 mg (750 mg Intravenous New Bag/Given 10/29/16 0043)  ipratropium-albuterol (DUONEB) 0.5-2.5 (3) MG/3ML nebulizer solution 3 mL (3 mLs Nebulization Given 10/29/16 0029)  albuterol (PROVENTIL) (2.5 MG/3ML) 0.083% nebulizer solution 2.5 mg (2.5 mg Nebulization Given 10/29/16 0029)    Initial Impression / Assessment and Plan / ED Course  I have reviewed the triage vital signs and the nursing notes.  Pertinent labs & imaging results that were available during my  care of the patient were reviewed by me and considered in my medical decision making (see chart for details).  Clinical Course   DIAGNOSTIC STUDIES:  Oxygen Saturation is 94% on RA, normal by my interpretation.    COORDINATION OF CARE:  11:59 PM Discussed treatment plan with pt and his son at bedside and they agreed to plan.Patient was given an albuterol/Atrovent nebulizer treatment for his wheezing and shortness of breath. A BMP was added  to make sure he did not have underlying congestive heart failure however the most likely diagnosis with his symptoms is going to be pneumonia. Patient has not been admitted to the hospital in the last 3 months. He was also started on Levaquin IV for his community-acquired pneumonia suspected. Patient has an allergy that precludes Rocephin and Zithromax.   Recheck  1:15 a.m. patient states he's feeling better. On exam his wheezing is gone. He however felt very warm to touch. His temperature was rechecked and I have asked the nurses to walk him and monitor his pulse ox.   Patient's temperature was still afebrile however when the nurses ambulated the patient his pulse ox dropped to 86%.   02:05 AM We discussed admission and they are agreeable.  2:15 AM Dr. Shanon Brow, hospitalist admit to observation med surgical bed   Final Clinical Impressions(s) / ED Diagnoses   Final diagnoses:  Community acquired pneumonia, unspecified laterality  Bronchospasm  Hypoxia    Plan admission  Rolland Porter, MD, Barbette Or, MD 10/29/16 [place], MD 10/29/16 [phone]

## 2016-10-29 DIAGNOSIS — J189 Pneumonia, unspecified organism: Secondary | ICD-10-CM | POA: Diagnosis not present

## 2016-10-29 DIAGNOSIS — I10 Essential (primary) hypertension: Secondary | ICD-10-CM

## 2016-10-29 DIAGNOSIS — F0280 Dementia in other diseases classified elsewhere without behavioral disturbance: Secondary | ICD-10-CM

## 2016-10-29 DIAGNOSIS — R0902 Hypoxemia: Secondary | ICD-10-CM | POA: Diagnosis present

## 2016-10-29 DIAGNOSIS — G309 Alzheimer's disease, unspecified: Secondary | ICD-10-CM

## 2016-10-29 LAB — BRAIN NATRIURETIC PEPTIDE: B Natriuretic Peptide: 61 pg/mL (ref 0.0–100.0)

## 2016-10-29 LAB — CBG MONITORING, ED: Glucose-Capillary: 143 mg/dL — ABNORMAL HIGH (ref 65–99)

## 2016-10-29 LAB — GLUCOSE, CAPILLARY
GLUCOSE-CAPILLARY: 136 mg/dL — AB (ref 65–99)
Glucose-Capillary: 110 mg/dL — ABNORMAL HIGH (ref 65–99)
Glucose-Capillary: 99 mg/dL (ref 65–99)
Glucose-Capillary: 144 mg/dL — ABNORMAL HIGH (ref 65–99)

## 2016-10-29 LAB — MRSA PCR SCREENING: MRSA by PCR: POSITIVE — AB

## 2016-10-29 LAB — TROPONIN I: Troponin I: <0.03 ng/mL (ref <0.03)

## 2016-10-29 MED ORDER — SODIUM CHLORIDE 0.9% FLUSH: 3.0000 mL | INTRAVENOUS | Status: DC | PRN

## 2016-10-29 MED ORDER — LEVOFLOXACIN IN D5W 750 MG/150ML IV SOLN
750.0000 mg | INTRAVENOUS | Status: DC
  Administered 2016-10-30: 750 mg via INTRAVENOUS

## 2016-10-29 MED ORDER — INSULIN ASPART 100 UNIT/ML ~~LOC~~ SOLN
0.0000 [IU] | Freq: Three times a day (TID) | SUBCUTANEOUS | Status: DC
  Administered 2016-10-30: 1 [IU] via SUBCUTANEOUS

## 2016-10-29 MED ORDER — SODIUM CHLORIDE 0.9 % IV SOLN
250.0000 mL | INTRAVENOUS | Status: DC | PRN
  Administered 2016-10-30: 250 mL via INTRAVENOUS

## 2016-10-29 MED ORDER — TRAMADOL HCL 50 MG PO TABS
50.0000 mg | ORAL_TABLET | Freq: Once | ORAL | Status: AC
  Administered 2016-10-29: 50 mg via ORAL
  Filled 2016-10-29: qty 1

## 2016-10-29 MED ORDER — SODIUM CHLORIDE 0.9% FLUSH
3.0000 mL | Freq: Two times a day (BID) | INTRAVENOUS | Status: DC
  Administered 2016-10-29 – 2016-10-30 (×3): 3 mL via INTRAVENOUS

## 2016-10-29 MED ORDER — IPRATROPIUM-ALBUTEROL 0.5-2.5 (3) MG/3ML IN SOLN
3.0000 mL | RESPIRATORY_TRACT | Status: AC
Start: 2016-10-29 — End: 2016-10-29
  Administered 2016-10-29: 3 mL via RESPIRATORY_TRACT
  Filled 2016-10-29: qty 3

## 2016-10-29 MED ORDER — ALBUTEROL SULFATE (2.5 MG/3ML) 0.083% IN NEBU
2.5000 mg | INHALATION_SOLUTION | RESPIRATORY_TRACT | Status: AC
  Administered 2016-10-29: 2.5 mg via RESPIRATORY_TRACT

## 2016-10-29 MED ORDER — CHLORHEXIDINE GLUCONATE CLOTH 2 % EX PADS
6.0000 | MEDICATED_PAD | Freq: Every day | CUTANEOUS | Status: DC
  Administered 2016-10-29 – 2016-10-30 (×2): 6 via TOPICAL

## 2016-10-29 MED ORDER — MUPIROCIN 2 % EX OINT
1.0000 "application " | TOPICAL_OINTMENT | Freq: Two times a day (BID) | CUTANEOUS | Status: DC
  Administered 2016-10-29 – 2016-10-30 (×3): 1 via NASAL
  Filled 2016-10-29 (×2): qty 22

## 2016-10-29 MED ORDER — LEVOFLOXACIN IN D5W 750 MG/150ML IV SOLN
750.0000 mg | Freq: Once | INTRAVENOUS | Status: AC
  Administered 2016-10-29: 750 mg via INTRAVENOUS
  Filled 2016-10-29: qty 150

## 2016-10-29 NOTE — Care Management Obs Status (Signed)
[facility] NOTIFICATION   Patient Details  Name: Samuel Ryan MRN: [id number] Date of Birth: Dec 15, 1930   Medicare Observation Status Notification Given:  Yes    Sherald Barge, RN 10/29/2016, 12:25 PM

## 2016-10-29 NOTE — Care Management Note (Signed)
Case Management Note  Patient Details  Name: Samuel Ryan MRN: [id number] Date of Birth: [birth year]  Subjective/Objective:                  Pt is from home, lives with his son and has aid that helps care for him 20hrs/week. Pt uses walker with ambulation. He has no HH services or resp DME PTA. He uses Psychiatrist for drugs and has PCP. CM has spoken with pt's son and he is in agreement of the DC plan.   Action/Plan: Pt plans to return home with resumption of previous arrangements. Pt may need PT eval if weakness continues. No CM needs anticipated.   Expected Discharge Date:    10/29/2016              Expected Discharge Plan:  Home/Self Care  In-House Referral:  NA  Discharge planning Services  CM Consult  Post Acute Care Choice:  NA Choice offered to:  NA  Status of Service:  In process, will continue to follow  Sherald Barge, RN 10/29/2016, 12:26 PM

## 2016-10-29 NOTE — H&P (Signed)
History and Physical    Samuel Ryan [id number] DOB: 1931-03-08 DOA: 10/28/2016  PCP: Tula Nakayama, MD  Patient coming from: home  Chief Complaint:  Sob, coughing a lot, fever, chills  HPI: Samuel Ryan is a 82 y.o. male with medical history significant of mild dementia, PE in 2012, HTN comes in with sob and coughing along with fever and chills for several days.  Pt reports he has not been feeling well.  He is not on oxygen supplement at home normally.  He has not been on antibiotics in over 6 months, is overall healthy and gets around at home pretty good.  His son is going through a marital separation so is living with him now.  Pt was ambulated in the ED and his oxygen sats dropped to 87% on RA so was therefore referred for admission for his CAP.   He denies any pain anywhere.  Review of Systems: As per HPI otherwise 10 point review of systems negative.   Past Medical History:  Diagnosis Date  . Acute cholecystitis 12/12/2012 to 12/19/2012   Drained percutaneously  . BPH (benign prostatic hypertrophy)   . Deep venous thrombosis (HCC)    Left leg, diagnosed 7/12  . Dementia    mild /needs supervision for safety per son -POA  . Diverticulitis   . Essential hypertension, benign   . History of pneumonia    prior to 2012  . Incontinence of urine    wears depends  . Mixed hyperlipidemia   . Osteoarthritis   . Pulmonary embolism ([place])    Diagnosed 7/12  . Type 2 diabetes mellitus ([place])     Past Surgical History:  Procedure Laterality Date  . CARPAL TUNNEL RELEASE    . COLONOSCOPY  08/31/2012   Procedure: COLONOSCOPY;  Surgeon: Rogene Houston, MD;  Location: [facility];  Service: Endoscopy;  Laterality: N/A;  830  . CYSTOSCOPY WITH INSERTION OF UROLIFT N/A 09/02/2016   Procedure: CYSTOSCOPY WITH INSERTION OF UROLIFT;  Surgeon: Cleon Gustin, MD;  Location: [facility];  Service: Urology;  Laterality: N/A;  . EYE SURGERY Bilateral    bilateral  cataract  . Right total knee replacement  04/23/2011  . SPINE SURGERY  prior to 2012   Neck fusion  . UMBILICAL HERNIA REPAIR     umbilical     reports that he has never smoked. He has never used smokeless tobacco. He reports that he does not drink alcohol or use drugs.  Allergies  Allergen Reactions  . Ace Inhibitors     cough  . Vancomycin Swelling    Facial swelling  . Zosyn [Piperacillin Sod-Tazobactam So] Swelling    Facial swelling    Family History  Problem Relation Age of Onset  . Cancer Mother     Stomach  . Diabetes Sister   . Hypertension Sister   . Diabetes Brother   . Cancer Brother     Gallbladder    Prior to Admission medications   Medication Sig Start Date End Date Taking? Authorizing Provider  acetaminophen (TYLENOL) 500 MG tablet Take 1,000 mg by mouth every 6 (six) hours as needed for moderate pain.    Historical Provider, MD  benazepril (LOTENSIN) 5 MG tablet Take 5 mg by mouth every morning. 09/06/16   Historical Provider, MD  clobetasol cream (TEMOVATE) [id number] % APPLY 1 APPLICATION TOPICALLY 2 (TWO) TIMES DAILY. Patient taking differently: Apply 1 application topically 2 (two) times daily as needed (dry skin).  APPLY 1 APPLICATION TOPICALLY 2 (TWO) TIMES DAILY. 07/22/16   [name], MD  donepezil (ARICEPT) 10 MG tablet TAKE 1 TABLET AT BEDTIME Patient taking differently: Take 10 mg by mouth at bedtime 04/29/16   [name], MD  ferrous sulfate 325 (65 FE) MG tablet Take 1 tablet (325 mg total) by mouth 2 (two) times daily. 09/20/16   [name], MD  fluticasone (FLONASE) 50 MCG/ACT nasal spray Place 2 sprays into both nostrils daily. Patient taking differently: Place 2 sprays into both nostrils daily as needed for allergies.  02/09/16   [name], MD  glipiZIDE (GLUCOTROL XL) 10 MG 24 hr tablet TAKE TWO TABLETS EVERY MORNING AT BREAKFAST (DOSE INCREASE) Patient taking differently: Take 20 mg by mouth daily with breakfast.   06/25/16   [name], MD  Insulin Glargine (LANTUS SOLOSTAR) 100 UNIT/ML Solostar Pen Inject 15 Units into the skin daily at 10 pm. Patient taking differently: Inject 15 Units into the skin every morning.  08/16/16   [name], MD  Insulin Pen Needle (PEN NEEDLES) 31G X 5 MM MISC 1 each by Does not apply route as directed. 07/13/16   [name], MD  linagliptin (TRADJENTA) 5 MG TABS tablet Take 1 tablet (5 mg total) by mouth daily. 08/16/16   [name], MD  loratadine (CLARITIN) 10 MG tablet Take 10 mg by mouth every morning.     Historical Provider, MD  lovastatin (MEVACOR) 40 MG tablet Take 1 tablet (40 mg total) by mouth at bedtime. 06/25/16   [name], MD  omeprazole (PRILOSEC) 40 MG capsule Take 1 capsule (40 mg total) by mouth daily. 09/20/16   [name], MD  Polyethylene Glycol 400 (BLINK TEARS OP) Apply 1 drop to eye daily as needed (dry eyes).    Historical Provider, MD  sertraline (ZOLOFT) 50 MG tablet TAKE 1 TABLET EVERY DAY Patient taking differently: Take 50 mg by mouth daily 04/29/16   [name], MD  tamsulosin (FLOMAX) 0.4 MG CAPS capsule Take 1 capsule (0.4 mg total) by mouth daily. Patient taking differently: Take 0.4 mg by mouth every morning.  06/25/16   [name], MD  traMADol (ULTRAM) 50 MG tablet Take 1 tablet (50 mg total) by mouth 2 (two) times daily. 09/20/16   [name], MD  TRUE METRIX BLOOD GLUCOSE TEST test strip TEST TWO TIMES DAILY 04/30/16   [name], MD  Vitamin D, Ergocalciferol, (DRISDOL) 50000 units CAPS capsule TAKE 1 CAPSULE EVERY 7 (SEVEN) DAYS. 09/20/16   [name], MD    Physical Exam: Vitals:   10/29/16 0126 10/29/16 0145 10/29/16 0200 10/29/16 0230  BP:  152/81 129/69 131/73  Pulse:  87 86 85  Resp:  23 21 16   Temp: 98.1 F (36.7 C)     TempSrc: Oral     SpO2:  94% 92% 90%  Weight:      Height:       Constitutional: NAD, calm, comfortable Vitals:    10/29/16 0126 10/29/16 0145 10/29/16 0200 10/29/16 0230  BP:  152/81 129/69 131/73  Pulse:  87 86 85  Resp:  23 21 16   Temp: 98.1 F (36.7 C)     TempSrc: Oral     SpO2:  94% 92% 90%  Weight:      Height:       Eyes: PERRL, lids and conjunctivae normal ENMT: Mucous membranes are moist. Posterior pharynx clear  of any exudate or lesions.Normal dentition.  Neck: normal, supple, no masses, no thyromegaly Respiratory: clear to auscultation bilaterally, no wheezing, no crackles. Normal respiratory effort. No accessory muscle use.  Cardiovascular: Regular rate and rhythm, no murmurs / rubs / gallops. No extremity edema. 2+ pedal pulses. No carotid bruits.  Abdomen: no tenderness, no masses palpated. No hepatosplenomegaly. Bowel sounds positive.  Musculoskeletal: no clubbing / cyanosis. No joint deformity upper and lower extremities. Good ROM, no contractures. Normal muscle tone.  Skin: no rashes, lesions, ulcers. No induration Neurologic: CN 2-12 grossly intact. Sensation intact, DTR normal. Strength 5/5 in all 4.  Psychiatric: Normal judgment and insight. Alert and oriented x 3. Normal mood.    Labs on Admission: I have personally reviewed following labs and imaging studies  CBC:  Recent Labs Lab 10/28/16 2324  WBC 5.6  NEUTROABS 3.2  HGB 10.8*  HCT 33.6*  MCV 93.6  PLT [id number]   Basic Metabolic Panel:  Recent Labs Lab 10/28/16 2324  NA 138  K 4.3  CL 105  CO2 26  GLUCOSE 157*  BUN 18  CREATININE 2.06*  CALCIUM 8.1*   GFR: Estimated Creatinine Clearance: 33.6 mL/min (by C-G formula based on SCr of 2.06 mg/dL (H)). Liver Function Tests:  Recent Labs Lab 10/28/16 2324  AST 18  ALT 13*  ALKPHOS 55  BILITOT 0.4  PROT 7.1  ALBUMIN 3.3*   Cardiac Enzymes:  Recent Labs Lab 10/28/16 2324  TROPONINI <0.03   CBG:  Recent Labs Lab 10/29/16 0242  GLUCAP 143*   Urine analysis:    Component Value Date/Time   COLORURINE YELLOW 09/10/2016 2050   APPEARANCEUR  CLEAR 09/10/2016 2050   LABSPEC 1.006 09/10/2016 2050   PHURINE 6.5 09/10/2016 2050   GLUCOSEU NEGATIVE 09/10/2016 2050   HGBUR MODERATE (A) 09/10/2016 2050   BILIRUBINUR NEGATIVE 09/10/2016 2050   BILIRUBINUR negative 03/05/2016 1254   KETONESUR NEGATIVE 09/10/2016 2050   PROTEINUR NEGATIVE 09/10/2016 2050   UROBILINOGEN 1.0 03/05/2016 1254   UROBILINOGEN 0.2 06/02/2015 1310   NITRITE NEGATIVE 09/10/2016 2050   LEUKOCYTESUR NEGATIVE 09/10/2016 2050   Radiological Exams on Admission: Dg Chest 2 View  Result Date: 10/28/2016 CLINICAL DATA:  Acute onset of productive cough and shortness of breath. Initial encounter. EXAM: CHEST  2 VIEW COMPARISON:  Chest radiograph performed 05/04/2016 FINDINGS: The lungs are well-aerated. Vascular congestion is noted. Increased interstitial markings raise concern for mild interstitial edema, though pneumonia could have a similar appearance. There is no evidence of pleural effusion or pneumothorax. The heart is borderline normal in size. No acute osseous abnormalities are seen. IMPRESSION: Vascular congestion noted. Increased central markings raise concern for mild interstitial edema, though pneumonia could have a similar appearance. Electronically Signed   By: Garald Balding M.D.   On: 10/28/2016 23:33    Assessment/Plan 82 yo male with CAP and mild hypoxia  Principal Problem:   CAP (community acquired pneumonia)- place on pna pathway.  levaquin iv.  Blood and sputum cx ordered.  Supplemental oxygen as needed.  Active Problems:   Hypoxia- on 2 liters [place] , wean as tolerated, abx for his pna   Dementia of the Alzheimer's type without behavioral disturbance- very mild , stable   Essential hypertension- stable   Pulmonary interstitial fibrosis ([place])- noted   DVT prophylaxis: scds  Code Status: full Family Communication: none  Disposition Plan:  Per day team Consults called:  none Admission status:  observation   Leontyne Manville A MD [facility]  If  7PM-7AM, please contact night-coverage [email or web address] Password [email or web address]  10/29/2016, 3:08 AM

## 2016-10-29 NOTE — Progress Notes (Signed)
70 Spoke with lab regarding MRSA PCR swab resulted positive.

## 2016-10-29 NOTE — Progress Notes (Signed)
Subjective: Patient admitted this morning, see detailed H&P by Dr. Shanon Brow. 82 y.o. male with medical history significant of mild dementia, PE in 2012, HTN comes in with sob and coughing along with fever and chills for several days.  Pt reports he has not been feeling well.  He is not on oxygen supplement at home normally.  He has not been on antibiotics in over 6 months, is overall healthy and gets around at home pretty good.  His son is going through a marital separation so is living with him now.  Pt was ambulated in the ED and his oxygen sats dropped to 87% on RA so was therefore referred for admission for his CAP.   He denies any pain anywhere.  Patient denies shortness of breath morning.  Vitals:   10/29/16 0300 10/29/16 0800  BP: 127/74   Pulse:    Resp: 21   Temp:  98.9 F (37.2 C)      A/P  Community acquired pneumonia Pulmonary interstitial fibrosis Dementia Essential hypertension  Continue IV antibiotics, Levaquin. Follow blood cultures and sputum culture results.   [facility] [phone]

## 2016-10-29 NOTE — ED Notes (Signed)
Pulse ox dropped as low as 86 during ambulation, then fluctuated around 88-89.

## 2016-10-30 DIAGNOSIS — J8410 Pulmonary fibrosis, unspecified: Secondary | ICD-10-CM | POA: Diagnosis not present

## 2016-10-30 DIAGNOSIS — I10 Essential (primary) hypertension: Secondary | ICD-10-CM | POA: Diagnosis not present

## 2016-10-30 DIAGNOSIS — J189 Pneumonia, unspecified organism: Secondary | ICD-10-CM | POA: Diagnosis not present

## 2016-10-30 DIAGNOSIS — G309 Alzheimer's disease, unspecified: Secondary | ICD-10-CM | POA: Diagnosis not present

## 2016-10-30 LAB — BASIC METABOLIC PANEL
ANION GAP: 7 (ref 5–15)
BUN: 17 mg/dL (ref 6–20)
CHLORIDE: 106 mmol/L (ref 101–111)
CO2: 25 mmol/L (ref 22–32)
Calcium: 8.5 mg/dL — ABNORMAL LOW (ref 8.9–10.3)
Creatinine, Ser: 1.82 mg/dL — ABNORMAL HIGH (ref 0.61–1.24)
GFR, EST AFRICAN AMERICAN: 38 mL/min — AB (ref >60)
GFR, EST NON AFRICAN AMERICAN: 33 mL/min — AB (ref >60)
Glucose, Bld: 148 mg/dL — ABNORMAL HIGH (ref 65–99)
POTASSIUM: 4.6 mmol/L (ref 3.5–5.1)
SODIUM: 138 mmol/L (ref 135–145)

## 2016-10-30 LAB — GLUCOSE, CAPILLARY
GLUCOSE-CAPILLARY: 118 mg/dL — AB (ref 65–99)
Glucose-Capillary: 135 mg/dL — ABNORMAL HIGH (ref 65–99)

## 2016-10-30 LAB — STREP PNEUMONIAE URINARY ANTIGEN: STREP PNEUMO URINARY ANTIGEN: NEGATIVE

## 2016-10-30 MED ORDER — LEVOFLOXACIN 750 MG PO TABS
750.0000 mg | ORAL_TABLET | ORAL | 0 refills | Status: DC
Start: 2016-10-30 — End: 2016-11-10

## 2016-10-30 MED ORDER — LEVOFLOXACIN IN D5W 750 MG/150ML IV SOLN: 750.0000 mg | INTRAVENOUS | Status: DC

## 2016-10-30 NOTE — Discharge Summary (Addendum)
Physician Discharge Summary  BRADIN HARDIN [id number] DOB: 1930-12-18 DOA: 10/28/2016  PCP: Tula Nakayama, MD  Admit date: 10/28/2016 Discharge date: 10/30/2016  Time spent: 25 minutes  Recommendations for Outpatient Follow-up:  1. Follow up PCP in 2 weeks 2. Patient to go home with HHPT/OT   Discharge Diagnoses:  Principal Problem:   CAP (community acquired pneumonia) Active Problems:   Dementia of the Alzheimer's type without behavioral disturbance   Essential hypertension   Pulmonary interstitial fibrosis ([place])   Hypoxia   Discharge Condition: Stable  Diet recommendation: Low salt diet  Filed Weights   10/28/16 2243  Weight: 108.9 kg (240 lb)    History of present illness:  82 y.o.malewith medical history significant of mild dementia, PE in 2012, HTN comes in with sob and coughing along with fever and chills for several days. Pt reports he has not been feeling well. He is not on oxygen supplement at home normally. He has not been on antibiotics in over 6 months, is overall healthy and gets around at home pretty good. His son is going through a marital separation so is living with him now. Pt was ambulated in the ED and his oxygen sats dropped to 87% on RA so was therefore referred for admission for his CAP.   Hospital Course:  Community acquired pneumonia-patient started on IV Levaquin, has improved. Not requiring oxygen at this time. Ambulated with assistance. Blood cultures 2 are negative so far. Will discharge patient on Levaquin 750 mg every 48 hours. For total 7 days of treatment.  Gait imbalance- patient required assistance with walking in the hallway, did not become hypoxic on ablation. Will arrange for home health PT OT.  Dementia, mild- patient has mild dementia, stable with no behavior disturbance.  Interstitial pulmonary fibrosis- stable.  CKD stage 3- creatinine is stable at baseline,  1.82  Procedures:  None  Consultations:  None  Discharge Exam: Vitals:   10/29/16 2049 10/30/16 0527  BP: 132/77 127/61  Pulse: 80 70  Resp: 20 18  Temp: 98.2 F (36.8 C) 98 F (36.7 C)    General: Appears in no acute distress Cardiovascular: RRR Respiratory: Clear to auscultation bilaterally  Discharge Instructions   Discharge Instructions    Diet - low sodium heart healthy    Complete by:  As directed    Increase activity slowly    Complete by:  As directed      Current Discharge Medication List    START taking these medications   Details  levofloxacin (LEVAQUIN) 750 MG tablet Take 1 tablet (750 mg total) by mouth every other day. Qty: 3 tablet, Refills: 0      CONTINUE these medications which have NOT CHANGED   Details  acetaminophen (TYLENOL) 500 MG tablet Take 1,000 mg by mouth every 6 (six) hours as needed for mild pain, moderate pain or headache.     benazepril (LOTENSIN) 5 MG tablet Take 5 mg by mouth daily.     Carboxymethylcellul-Glycerin 0.5-0.9 % SOLN Place 1 drop into both eyes as needed (for dry eyes).    clobetasol cream (TEMOVATE) [id number] % Apply 1 application topically 2 (two) times daily as needed (for itching/irritation).    donepezil (ARICEPT) 10 MG tablet Take 10 mg by mouth at bedtime.    ferrous sulfate 325 (65 FE) MG tablet Take 325 mg by mouth daily with breakfast.    fluticasone (FLONASE) 50 MCG/ACT nasal spray Place 2 sprays into both nostrils daily as needed for rhinitis.  glipiZIDE (GLUCOTROL) 10 MG tablet Take 20 mg by mouth daily before breakfast.    insulin glargine (LANTUS) 100 unit/mL SOPN Inject 15 Units into the skin daily.    linagliptin (TRADJENTA) 5 MG TABS tablet Take 1 tablet (5 mg total) by mouth daily. Qty: 30 tablet, Refills: 5    loratadine (CLARITIN) 10 MG tablet Take 10 mg by mouth daily.     lovastatin (MEVACOR) 40 MG tablet Take 1 tablet (40 mg total) by mouth at bedtime. Qty: 90 tablet, Refills: 1     mirabegron ER (MYRBETRIQ) 25 MG TB24 tablet Take 25 mg by mouth daily.    omeprazole (PRILOSEC) 40 MG capsule Take 1 capsule (40 mg total) by mouth daily. Qty: 90 capsule, Refills: 1    sertraline (ZOLOFT) 50 MG tablet Take 50 mg by mouth daily.    tamsulosin (FLOMAX) 0.4 MG CAPS capsule Take 1 capsule (0.4 mg total) by mouth daily. Qty: 90 capsule, Refills: 1    traMADol (ULTRAM) 50 MG tablet Take 1 tablet (50 mg total) by mouth 2 (two) times daily. Qty: 180 tablet, Refills: 0    Vitamin D, Ergocalciferol, (DRISDOL) 50000 units CAPS capsule Take 50,000 Units by mouth every Friday.       Allergies  Allergen Reactions  . Ace Inhibitors Cough  . Vancomycin Swelling and Other (See Comments)    Reaction:  Facial swelling  . Zosyn [Piperacillin Sod-Tazobactam So] Swelling and Other (See Comments)    Reaction:  Facial swelling      The results of significant diagnostics from this hospitalization (including imaging, microbiology, ancillary and laboratory) are listed below for reference.    Significant Diagnostic Studies: Dg Chest 2 View  Result Date: 10/28/2016 CLINICAL DATA:  Acute onset of productive cough and shortness of breath. Initial encounter. EXAM: CHEST  2 VIEW COMPARISON:  Chest radiograph performed 05/04/2016 FINDINGS: The lungs are well-aerated. Vascular congestion is noted. Increased interstitial markings raise concern for mild interstitial edema, though pneumonia could have a similar appearance. There is no evidence of pleural effusion or pneumothorax. The heart is borderline normal in size. No acute osseous abnormalities are seen. IMPRESSION: Vascular congestion noted. Increased central markings raise concern for mild interstitial edema, though pneumonia could have a similar appearance. Electronically Signed   By: Garald Balding M.D.   On: 10/28/2016 23:33    Microbiology: Recent Results (from the past 240 hour(s))  Blood culture (routine x 2)     Status: None  (Preliminary result)   Collection Time: 10/29/16 12:17 AM  Result Value Ref Range Status   Specimen Description BLOOD RIGHT ANTECUBITAL  Final   Special Requests   Final    BOTTLES DRAWN AEROBIC AND ANAEROBIC AEB 8CC ANA 4CC   Culture NO GROWTH < 12 HOURS  Final   Report Status PENDING  Incomplete  Blood culture (routine x 2)     Status: None (Preliminary result)   Collection Time: 10/29/16 12:36 AM  Result Value Ref Range Status   Specimen Description BLOOD RIGHT HAND  Final   Special Requests BOTTLES DRAWN AEROBIC AND ANAEROBIC 6CC EACH  Final   Culture NO GROWTH < 12 HOURS  Final   Report Status PENDING  Incomplete  MRSA PCR Screening     Status: Abnormal   Collection Time: 10/29/16  3:26 AM  Result Value Ref Range Status   MRSA by PCR POSITIVE (A) NEGATIVE Final    Comment:        The GeneXpert MRSA Assay (FDA  approved for NASAL specimens only), is one component of a comprehensive MRSA colonization surveillance program. It is not intended to diagnose MRSA infection nor to guide or monitor treatment for MRSA infections. RESULT CALLED TO, READ BACK BY AND VERIFIED WITH: MCGIBBONY C. AT 0843A ON 120117 BY THOMPSON S.      Labs: Basic Metabolic Panel:  Recent Labs Lab 10/28/16 2324 10/30/16 0557  NA 138 138  K 4.3 4.6  CL 105 106  CO2 26 25  GLUCOSE 157* 148*  BUN 18 17  CREATININE 2.06* 1.82*  CALCIUM 8.1* 8.5*   Liver Function Tests:  Recent Labs Lab 10/28/16 2324  AST 18  ALT 13*  ALKPHOS 55  BILITOT 0.4  PROT 7.1  ALBUMIN 3.3*   CBC:  Recent Labs Lab 10/28/16 2324  WBC 5.6  NEUTROABS 3.2  HGB 10.8*  HCT 33.6*  MCV 93.6  PLT 161   Cardiac Enzymes:  Recent Labs Lab 10/28/16 2324  TROPONINI <0.03   BNP: BNP (last 3 results)  Recent Labs  10/28/16 2324  BNP 61.0    CBG:  Recent Labs Lab 10/29/16 0749 10/29/16 1127 10/29/16 1639 10/29/16 2048 10/30/16 0737  GLUCAP 110* 99 136* 144* 118*     Signed:  Eleonore Chiquito S  MD.  [facility] 10/30/2016, 11:16 AM

## 2016-10-30 NOTE — Plan of Care (Signed)
Problem: Activity: Goal: Risk for activity intolerance will decrease Outcome: Adequate for Discharge Home health PT/OT arranged [facility].

## 2016-10-30 NOTE — Progress Notes (Signed)
Patient ambulated in hallway with nursing standby assist, gait belt and walker. Ambulated from his room to nurses station and back, son present as well. Pt tolerated well, no c/o shortness of breath or dizziness. Generalized weakness, required 3 assist up out of bed. Son reports he has "been weak and not up for few days.". O2 sats 94% on r/a during ambulation. Notified Dr Darrick Meigs. Orders received to arrange home health PT/OT. Patient and son aware, would like [facility] if possible. Donavan Foil, RN

## 2016-10-30 NOTE — Progress Notes (Signed)
Discharge instructions reviewed with patient and son. Given copy of AVS, prescription sent to his pharmacy per MD. Verbalized understanding of instructions, has appt with PCP arranged already. Son states will call sooner if necessary. IV site d/c'd, site within normal limits. No c/o prior to discharge. Home health PT/OT arranged, pt and son aware. Pt left floor in stable condition via w/c accompanied by his son and nurse tech. Donavan Foil, RN

## 2016-10-30 NOTE — Progress Notes (Signed)
Notified [facility] of home health PT/OT orders. Patient's son asked nursing to verify they would work with his insurance. Home health rep states they are in network with his insurance. Order and facesheet faxed as requested. Notified them patient's son would like to be contacted prior to them coming out so he can be present. Pt in agreement. Donavan Foil, RN

## 2016-10-30 NOTE — Progress Notes (Signed)
CM received information and faxed to [facility] to start services.

## 2016-11-01 ENCOUNTER — Telehealth: Payer: Self-pay | Admitting: Family Medicine

## 2016-11-01 DIAGNOSIS — K810 Acute cholecystitis: Secondary | ICD-10-CM | POA: Diagnosis not present

## 2016-11-01 DIAGNOSIS — J8410 Pulmonary fibrosis, unspecified: Secondary | ICD-10-CM | POA: Diagnosis not present

## 2016-11-01 DIAGNOSIS — Z86711 Personal history of pulmonary embolism: Secondary | ICD-10-CM | POA: Diagnosis not present

## 2016-11-01 DIAGNOSIS — M1990 Unspecified osteoarthritis, unspecified site: Secondary | ICD-10-CM | POA: Diagnosis not present

## 2016-11-01 DIAGNOSIS — E119 Type 2 diabetes mellitus without complications: Secondary | ICD-10-CM | POA: Diagnosis not present

## 2016-11-01 DIAGNOSIS — G309 Alzheimer's disease, unspecified: Secondary | ICD-10-CM | POA: Diagnosis not present

## 2016-11-01 DIAGNOSIS — F0280 Dementia in other diseases classified elsewhere without behavioral disturbance: Secondary | ICD-10-CM | POA: Diagnosis not present

## 2016-11-01 DIAGNOSIS — J189 Pneumonia, unspecified organism: Secondary | ICD-10-CM | POA: Diagnosis not present

## 2016-11-01 DIAGNOSIS — I10 Essential (primary) hypertension: Secondary | ICD-10-CM | POA: Diagnosis not present

## 2016-11-01 LAB — LEGIONELLA PNEUMOPHILA SEROGP 1 UR AG: L. pneumophila Serogp 1 Ur Ag: NEGATIVE

## 2016-11-01 NOTE — Telephone Encounter (Signed)
Noted scheduled for Dec 7th at 3 due to transportation

## 2016-11-01 NOTE — Telephone Encounter (Signed)
pls call pt, let him know that I have reviewed his record and am aware that he was recently hospitalized with pneumonia, hope that he continues to imp[rove at home and schedule hosp f/u with me next week, thanks

## 2016-11-02 LAB — CULTURE, RESPIRATORY: CULTURE: NORMAL

## 2016-11-02 LAB — CULTURE, RESPIRATORY W GRAM STAIN

## 2016-11-03 DIAGNOSIS — K810 Acute cholecystitis: Secondary | ICD-10-CM | POA: Diagnosis not present

## 2016-11-03 DIAGNOSIS — F0280 Dementia in other diseases classified elsewhere without behavioral disturbance: Secondary | ICD-10-CM | POA: Diagnosis not present

## 2016-11-03 DIAGNOSIS — I10 Essential (primary) hypertension: Secondary | ICD-10-CM | POA: Diagnosis not present

## 2016-11-03 DIAGNOSIS — J189 Pneumonia, unspecified organism: Secondary | ICD-10-CM | POA: Diagnosis not present

## 2016-11-03 DIAGNOSIS — G309 Alzheimer's disease, unspecified: Secondary | ICD-10-CM | POA: Diagnosis not present

## 2016-11-03 DIAGNOSIS — J8410 Pulmonary fibrosis, unspecified: Secondary | ICD-10-CM | POA: Diagnosis not present

## 2016-11-03 DIAGNOSIS — M1990 Unspecified osteoarthritis, unspecified site: Secondary | ICD-10-CM | POA: Diagnosis not present

## 2016-11-03 DIAGNOSIS — E119 Type 2 diabetes mellitus without complications: Secondary | ICD-10-CM | POA: Diagnosis not present

## 2016-11-03 DIAGNOSIS — Z86711 Personal history of pulmonary embolism: Secondary | ICD-10-CM | POA: Diagnosis not present

## 2016-11-03 LAB — CULTURE, BLOOD (ROUTINE X 2)
CULTURE: NO GROWTH
Culture: NO GROWTH

## 2016-11-04 ENCOUNTER — Ambulatory Visit (INDEPENDENT_AMBULATORY_CARE_PROVIDER_SITE_OTHER): Payer: Commercial Managed Care - HMO | Admitting: Family Medicine

## 2016-11-04 ENCOUNTER — Encounter: Payer: Self-pay | Admitting: Family Medicine

## 2016-11-04 VITALS — BP 140/72 | HR 87 | Resp 16 | Ht 70.0 in | Wt 269.0 lb

## 2016-11-04 DIAGNOSIS — F0280 Dementia in other diseases classified elsewhere without behavioral disturbance: Secondary | ICD-10-CM

## 2016-11-04 DIAGNOSIS — Z9181 History of falling: Secondary | ICD-10-CM

## 2016-11-04 DIAGNOSIS — I10 Essential (primary) hypertension: Secondary | ICD-10-CM

## 2016-11-04 DIAGNOSIS — Z09 Encounter for follow-up examination after completed treatment for conditions other than malignant neoplasm: Secondary | ICD-10-CM

## 2016-11-04 DIAGNOSIS — M159 Polyosteoarthritis, unspecified: Secondary | ICD-10-CM

## 2016-11-04 DIAGNOSIS — F0393 Unspecified dementia, unspecified severity, with mood disturbance: Secondary | ICD-10-CM

## 2016-11-04 DIAGNOSIS — F329 Major depressive disorder, single episode, unspecified: Secondary | ICD-10-CM

## 2016-11-04 DIAGNOSIS — R600 Localized edema: Secondary | ICD-10-CM | POA: Diagnosis not present

## 2016-11-04 DIAGNOSIS — F0281 Dementia in other diseases classified elsewhere with behavioral disturbance: Secondary | ICD-10-CM

## 2016-11-04 DIAGNOSIS — G301 Alzheimer's disease with late onset: Secondary | ICD-10-CM

## 2016-11-04 MED ORDER — FUROSEMIDE 20 MG PO TABS: 20.0000 mg | ORAL_TABLET | Freq: Every day | ORAL | 0 refills | Status: DC

## 2016-11-04 NOTE — Patient Instructions (Addendum)
Keep end December follow up visit  STOP tramadol for pain, instead use tylenol 500 mg up top three tablets daily  Continue iron only one daily till next visit  New for 10 days is furosemide 20 mg to help to get rid of excess fluid  HBA1C, non fast cmp and eGFR, cbc , iron and ferritin and B12 and vit D and tSH before visit

## 2016-11-08 ENCOUNTER — Telehealth: Payer: Self-pay

## 2016-11-08 ENCOUNTER — Other Ambulatory Visit: Payer: Self-pay | Admitting: Family Medicine

## 2016-11-08 DIAGNOSIS — J189 Pneumonia, unspecified organism: Secondary | ICD-10-CM | POA: Diagnosis not present

## 2016-11-08 DIAGNOSIS — G309 Alzheimer's disease, unspecified: Secondary | ICD-10-CM | POA: Diagnosis not present

## 2016-11-08 DIAGNOSIS — F0280 Dementia in other diseases classified elsewhere without behavioral disturbance: Secondary | ICD-10-CM | POA: Diagnosis not present

## 2016-11-08 DIAGNOSIS — J8410 Pulmonary fibrosis, unspecified: Secondary | ICD-10-CM | POA: Diagnosis not present

## 2016-11-08 DIAGNOSIS — E119 Type 2 diabetes mellitus without complications: Secondary | ICD-10-CM | POA: Diagnosis not present

## 2016-11-08 DIAGNOSIS — M1990 Unspecified osteoarthritis, unspecified site: Secondary | ICD-10-CM | POA: Diagnosis not present

## 2016-11-08 DIAGNOSIS — Z86711 Personal history of pulmonary embolism: Secondary | ICD-10-CM | POA: Diagnosis not present

## 2016-11-08 DIAGNOSIS — I10 Essential (primary) hypertension: Secondary | ICD-10-CM | POA: Diagnosis not present

## 2016-11-08 DIAGNOSIS — K810 Acute cholecystitis: Secondary | ICD-10-CM | POA: Diagnosis not present

## 2016-11-08 MED ORDER — PREDNISONE 5 MG PO TABS: 5.0000 mg | ORAL_TABLET | Freq: Two times a day (BID) | ORAL | 0 refills | Status: DC

## 2016-11-08 NOTE — Telephone Encounter (Signed)
pls advise sugar free robitussin dM at night, and I have sent in 5 days of prednisone

## 2016-11-08 NOTE — Telephone Encounter (Signed)
Patient and aide aware of medications.   Son made aware as well.

## 2016-11-09 DIAGNOSIS — E119 Type 2 diabetes mellitus without complications: Secondary | ICD-10-CM | POA: Diagnosis not present

## 2016-11-09 DIAGNOSIS — G309 Alzheimer's disease, unspecified: Secondary | ICD-10-CM | POA: Diagnosis not present

## 2016-11-09 DIAGNOSIS — K810 Acute cholecystitis: Secondary | ICD-10-CM | POA: Diagnosis not present

## 2016-11-09 DIAGNOSIS — M1990 Unspecified osteoarthritis, unspecified site: Secondary | ICD-10-CM | POA: Diagnosis not present

## 2016-11-09 DIAGNOSIS — I10 Essential (primary) hypertension: Secondary | ICD-10-CM | POA: Diagnosis not present

## 2016-11-09 DIAGNOSIS — Z86711 Personal history of pulmonary embolism: Secondary | ICD-10-CM | POA: Diagnosis not present

## 2016-11-09 DIAGNOSIS — J189 Pneumonia, unspecified organism: Secondary | ICD-10-CM | POA: Diagnosis not present

## 2016-11-09 DIAGNOSIS — J8410 Pulmonary fibrosis, unspecified: Secondary | ICD-10-CM | POA: Diagnosis not present

## 2016-11-09 DIAGNOSIS — F0280 Dementia in other diseases classified elsewhere without behavioral disturbance: Secondary | ICD-10-CM | POA: Diagnosis not present

## 2016-11-10 ENCOUNTER — Emergency Department (HOSPITAL_COMMUNITY): Payer: Commercial Managed Care - HMO

## 2016-11-10 ENCOUNTER — Telehealth: Payer: Self-pay

## 2016-11-10 ENCOUNTER — Emergency Department (HOSPITAL_COMMUNITY)
Admission: EM | Admit: 2016-11-10 | Discharge: 2016-11-10 | Disposition: A | Discharge status: Home / Self Care | Payer: Commercial Managed Care - HMO | Attending: Emergency Medicine | Admitting: Emergency Medicine

## 2016-11-10 ENCOUNTER — Other Ambulatory Visit: Payer: Self-pay

## 2016-11-10 ENCOUNTER — Encounter (HOSPITAL_COMMUNITY): Payer: Self-pay | Admitting: Emergency Medicine

## 2016-11-10 DIAGNOSIS — R05 Cough: Secondary | ICD-10-CM | POA: Insufficient documentation

## 2016-11-10 DIAGNOSIS — E119 Type 2 diabetes mellitus without complications: Secondary | ICD-10-CM | POA: Insufficient documentation

## 2016-11-10 DIAGNOSIS — Z794 Long term (current) use of insulin: Secondary | ICD-10-CM | POA: Insufficient documentation

## 2016-11-10 DIAGNOSIS — M7989 Other specified soft tissue disorders: Secondary | ICD-10-CM | POA: Insufficient documentation

## 2016-11-10 DIAGNOSIS — I10 Essential (primary) hypertension: Secondary | ICD-10-CM | POA: Diagnosis not present

## 2016-11-10 DIAGNOSIS — Z79899 Other long term (current) drug therapy: Secondary | ICD-10-CM | POA: Diagnosis not present

## 2016-11-10 DIAGNOSIS — R059 Cough, unspecified: Secondary | ICD-10-CM

## 2016-11-10 DIAGNOSIS — R0602 Shortness of breath: Secondary | ICD-10-CM | POA: Diagnosis not present

## 2016-11-10 LAB — URINALYSIS, ROUTINE W REFLEX MICROSCOPIC
Bilirubin Urine: NEGATIVE
Glucose, UA: NEGATIVE mg/dL
Ketones, ur: NEGATIVE mg/dL
Leukocytes, UA: NEGATIVE
Nitrite: NEGATIVE
PH: 6 (ref 5.0–8.0)
Protein, ur: NEGATIVE mg/dL
SPECIFIC GRAVITY, URINE: 1.01 (ref 1.005–1.030)

## 2016-11-10 LAB — CBC
HEMATOCRIT: 35.5 % — AB (ref 39.0–52.0)
HEMOGLOBIN: 11.6 g/dL — AB (ref 13.0–17.0)
MCH: 30.4 pg (ref 26.0–34.0)
MCHC: 32.7 g/dL (ref 30.0–36.0)
MCV: 93.2 fL (ref 78.0–100.0)
Platelets: 179 10*3/uL (ref 150–400)
RBC: 3.81 MIL/uL — ABNORMAL LOW (ref 4.22–5.81)
RDW: 13.7 % (ref 11.5–15.5)
WBC: 6.1 10*3/uL (ref 4.0–10.5)

## 2016-11-10 LAB — COMPREHENSIVE METABOLIC PANEL
ALBUMIN: 3.6 g/dL (ref 3.5–5.0)
ALT: 13 U/L — ABNORMAL LOW (ref 17–63)
ANION GAP: 7 (ref 5–15)
AST: 19 U/L (ref 15–41)
Alkaline Phosphatase: 62 U/L (ref 38–126)
BUN: 21 mg/dL — ABNORMAL HIGH (ref 6–20)
CHLORIDE: 107 mmol/L (ref 101–111)
CO2: 24 mmol/L (ref 22–32)
Calcium: 8.7 mg/dL — ABNORMAL LOW (ref 8.9–10.3)
Creatinine, Ser: 1.77 mg/dL — ABNORMAL HIGH (ref 0.61–1.24)
GFR calc Af Amer: 39 mL/min — ABNORMAL LOW (ref >60)
GFR calc non Af Amer: 34 mL/min — ABNORMAL LOW (ref >60)
GLUCOSE: 148 mg/dL — AB (ref 65–99)
POTASSIUM: 4.4 mmol/L (ref 3.5–5.1)
SODIUM: 138 mmol/L (ref 135–145)
TOTAL PROTEIN: 7.7 g/dL (ref 6.5–8.1)
Total Bilirubin: 0.9 mg/dL (ref 0.3–1.2)

## 2016-11-10 LAB — URINALYSIS, MICROSCOPIC (REFLEX)
Bacteria, UA: NONE SEEN
SQUAMOUS EPITHELIAL / LPF: NONE SEEN
WBC, UA: NONE SEEN WBC/hpf (ref 0–5)

## 2016-11-10 LAB — TROPONIN I: Troponin I: <0.03 ng/mL (ref <0.03)

## 2016-11-10 LAB — BRAIN NATRIURETIC PEPTIDE: B Natriuretic Peptide: 35 pg/mL (ref 0.0–100.0)

## 2016-11-10 LAB — CBG MONITORING, ED: Glucose-Capillary: 139 mg/dL — ABNORMAL HIGH (ref 65–99)

## 2016-11-10 MED ORDER — BENZONATATE 100 MG PO CAPS: 100.0000 mg | ORAL_CAPSULE | Freq: Three times a day (TID) | ORAL | 0 refills | Status: DC

## 2016-11-10 NOTE — ED Triage Notes (Signed)
Patient complains of congestion and cough. Son states caregiver noticed worsening confusion this morning. Was admitted for pneumonia last week.

## 2016-11-10 NOTE — ED Provider Notes (Signed)
[facility] DEPT Provider Note   CSN: [id number] Arrival date & time: 11/10/16  1242     History   Chief Complaint Chief Complaint  Patient presents with  . Weakness  . Altered Mental Status    HPI Samuel Ryan is a 82 y.o. male.  HPI Patient presents to the emergency room with complaints of persistent cough.  Patient was previously admitted to the hospital beginning of this month for pneumonia. The patient has completed courses of antibiotics. The patient's son states that he never really has improved. He continues to have a frequent cough bringing up clear mucus. he feels that the symptoms are worse at night and he has more difficulty with coughing. He feels like something is stuck in his throat when that happens. He denies any chest pain. He was having some leg swelling but that has improved with diuretics. This morning he was having generalized weakness and aching all over. His caregiver thought that he was more confused so they brought him in  for further evaluation  Past Medical History:  Diagnosis Date  . Acute cholecystitis 12/12/2012 to 12/19/2012   Drained percutaneously  . BPH (benign prostatic hypertrophy)   . Deep venous thrombosis (HCC)    Left leg, diagnosed 7/12  . Dementia    mild /needs supervision for safety per son -POA  . Diverticulitis   . Essential hypertension, benign   . History of pneumonia    prior to 2012  . Incontinence of urine    wears depends  . Mixed hyperlipidemia   . Osteoarthritis   . Pulmonary embolism ([place])    Diagnosed 7/12  . Type 2 diabetes mellitus [facility])     Patient Active Problem List   Diagnosis Date Noted  . CAP (community acquired pneumonia) 10/29/2016  . Hypoxia 10/29/2016  . Urinary frequency 09/17/2015  . At high risk for falls 03/30/2015  . Generalized osteoarthritis 07/16/2014  . Urinary incontinence 07/16/2014  . Pulmonary interstitial fibrosis ([place]) 07/03/2014  . Abnormal CXR (chest x-ray) 05/27/2014  .  Thyroid mass of unclear etiology 05/07/2014  . Vitamin D deficiency 01/31/2014  . Allergic rhinitis 01/31/2014  . Depression due to dementia 02/25/2013  . Diverticulosis of sigmoid colon 12/13/2012  . Right ventricular dysfunction 07/20/2011  . Heart disease, unspecified 07/20/2011  . Dementia of the Alzheimer's type without behavioral disturbance 09/12/2010  . Goiter 06/19/2009  . Essential hypertension 12/11/2008  . Type 2 diabetes, uncontrolled, with renal manifestation ([place]) 09/05/2008  . Hyperlipidemia with target LDL less than 100 07/19/2008  . BENIGN PROSTATIC HYPERTROPHY, HX OF 03/28/2008    Past Surgical History:  Procedure Laterality Date  . CARPAL TUNNEL RELEASE    . COLONOSCOPY  08/31/2012   Procedure: COLONOSCOPY;  Surgeon: Rogene Houston, MD;  Location: [facility];  Service: Endoscopy;  Laterality: N/A;  830  . CYSTOSCOPY WITH INSERTION OF UROLIFT N/A 09/02/2016   Procedure: CYSTOSCOPY WITH INSERTION OF UROLIFT;  Surgeon: Cleon Gustin, MD;  Location: [facility];  Service: Urology;  Laterality: N/A;  . EYE SURGERY Bilateral    bilateral cataract  . Right total knee replacement  04/23/2011  . SPINE SURGERY  prior to 2012   Neck fusion  . UMBILICAL HERNIA REPAIR     umbilical       Home Medications    Prior to Admission medications   Medication Sig Start Date End Date Taking? Authorizing Provider  acetaminophen (TYLENOL) 500 MG tablet Take 1,000 mg by mouth  every 6 (six) hours as needed for mild pain, moderate pain or headache.    Yes Historical Provider, MD  Dextromethorphan-Guaifenesin (MUCINEX DM MAXIMUM STRENGTH PO) Take 10 mLs by mouth 2 (two) times daily.   Yes Historical Provider, MD  donepezil (ARICEPT) 10 MG tablet Take 10 mg by mouth at bedtime.   Yes Historical Provider, MD  ferrous sulfate 325 (65 FE) MG tablet Take 325 mg by mouth daily with breakfast.   Yes Historical Provider, MD  furosemide (LASIX) 20 MG tablet Take 1 tablet  (20 mg total) by mouth daily. 11/04/16  Yes [name], MD  glipiZIDE (GLUCOTROL XL) 10 MG 24 hr tablet Take 10 mg by mouth daily with breakfast.   Yes Historical Provider, MD  guaiFENesin (MUCINEX) 600 MG 12 hr tablet Take by mouth 2 (two) times daily.   Yes Historical Provider, MD  insulin glargine (LANTUS) 100 unit/mL SOPN Inject 15 Units into the skin daily.   Yes Historical Provider, MD  linagliptin (TRADJENTA) 5 MG TABS tablet Take 1 tablet (5 mg total) by mouth daily. 08/16/16  Yes [name], MD  lovastatin (MEVACOR) 40 MG tablet Take 1 tablet (40 mg total) by mouth at bedtime. 06/25/16  Yes [name], MD  mirabegron ER (MYRBETRIQ) 25 MG TB24 tablet Take 25 mg by mouth daily.   Yes Historical Provider, MD  omeprazole (PRILOSEC) 40 MG capsule Take 1 capsule (40 mg total) by mouth daily. 09/20/16  Yes [name], MD  predniSONE (DELTASONE) 5 MG tablet Take 1 tablet (5 mg total) by mouth 2 (two) times daily with a meal. 11/08/16  Yes [name], MD  sertraline (ZOLOFT) 50 MG tablet Take 50 mg by mouth daily.   Yes Historical Provider, MD  tamsulosin (FLOMAX) 0.4 MG CAPS capsule Take 1 capsule (0.4 mg total) by mouth daily. 06/25/16  Yes [name], MD  Vitamin D, Ergocalciferol, (DRISDOL) 50000 units CAPS capsule Take 50,000 Units by mouth every Friday.   Yes Historical Provider, MD  benzonatate (TESSALON) 100 MG capsule Take 1 capsule (100 mg total) by mouth every 8 (eight) hours. 11/10/16   Dorie Rank, MD  Carboxymethylcellul-Glycerin 0.5-0.9 % SOLN Place 1 drop into both eyes as needed (for dry eyes).    Historical Provider, MD  clobetasol cream (TEMOVATE) [id number] % Apply 1 application topically 2 (two) times daily as needed (for itching/irritation).    Historical Provider, MD  fluticasone (FLONASE) 50 MCG/ACT nasal spray Place 2 sprays into both nostrils daily as needed for rhinitis.    Historical Provider, MD  loratadine (CLARITIN) 10 MG tablet Take  10 mg by mouth daily.     Historical Provider, MD    Family History Family History  Problem Relation Age of Onset  . Cancer Mother     Stomach  . Diabetes Sister   . Hypertension Sister   . Diabetes Brother   . Cancer Brother     Gallbladder    Social History Social History  Substance Use Topics  . Smoking status: Never Smoker  . Smokeless tobacco: Never Used  . Alcohol use No     Allergies   Ace inhibitors; Vancomycin; and Zosyn [piperacillin sod-tazobactam so]   Review of Systems Review of Systems  All other systems reviewed and are negative.    Physical Exam Updated Vital Signs BP 145/87   Pulse 62   Temp 97.8 F (36.6 C) (Oral)   Resp 19   Ht 5\' 10"  (1.778  m)   Wt 122 kg   SpO2 95%   BMI 38.60 kg/m   Physical Exam  Constitutional: No distress.  Frequent coughing  HENT:  Head: Normocephalic and atraumatic.  Right Ear: External ear normal.  Left Ear: External ear normal.  Eyes: Conjunctivae are normal. Right eye exhibits no discharge. Left eye exhibits no discharge. No scleral icterus.  Neck: Neck supple. No tracheal deviation present.  Cardiovascular: Normal rate, regular rhythm and intact distal pulses.   Pulmonary/Chest: Effort normal and breath sounds normal. No stridor. No respiratory distress. He has no wheezes. He has no rales.  Frequent coughing  Abdominal: Soft. Bowel sounds are normal. He exhibits no distension. There is no tenderness. There is no rebound and no guarding.  Musculoskeletal: He exhibits edema (mild edema of the lower extremities at the ankles). He exhibits no tenderness.  Neurological: He is alert. He has normal strength. No cranial nerve deficit (no facial droop, extraocular movements intact, no slurred speech) or sensory deficit. He exhibits normal muscle tone. He displays no seizure activity. Coordination normal.  Skin: Skin is warm and dry. No rash noted.  Psychiatric: He has a normal mood and affect.  Nursing note and  vitals reviewed.    ED Treatments / Results  Labs (all labs ordered are listed, but only abnormal results are displayed) Labs Reviewed  COMPREHENSIVE METABOLIC PANEL - Abnormal; Notable for the following:       Result Value   Glucose, Bld 148 (*)    BUN 21 (*)    Creatinine, Ser 1.77 (*)    Calcium 8.7 (*)    ALT 13 (*)    GFR calc non Af Amer 34 (*)    GFR calc Af Amer 39 (*)    All other components within normal limits  CBC - Abnormal; Notable for the following:    RBC 3.81 (*)    Hemoglobin 11.6 (*)    HCT 35.5 (*)    All other components within normal limits  URINALYSIS, ROUTINE W REFLEX MICROSCOPIC - Abnormal; Notable for the following:    APPearance CLOUDY (*)    Hgb urine dipstick TRACE (*)    All other components within normal limits  CBG MONITORING, ED - Abnormal; Notable for the following:    Glucose-Capillary 139 (*)    All other components within normal limits  BRAIN NATRIURETIC PEPTIDE  TROPONIN I  URINALYSIS, MICROSCOPIC (REFLEX)  CBC WITH DIFFERENTIAL/PLATELET    EKG  EKG Interpretation  Date/Time:  Wednesday November 10 2016 14:43:11 EST Ventricular Rate:  70 PR Interval:    QRS Duration: 146 QT Interval:  420 QTC Calculation: 454 R Axis:   64 Text Interpretation:  Sinus rhythm Right bundle branch block No significant change since last tracing Confirmed by Sammuel Blick  MD-J, Mery Guadalupe [id number]) on 11/10/2016 2:55:21 PM       Radiology Dg Chest 2 View  Result Date: 11/10/2016 CLINICAL DATA:  Productive cough and chest congestion. Shortness of breath. EXAM: CHEST  2 VIEW COMPARISON:  10/28/2016 and 05/04/2016 FINDINGS: Heart size and pulmonary vascularity are within normal limits. Minimal scarring at the lung bases. No residual infiltrates. No effusions. No acute osseous abnormality. Chronic accentuation of the interstitial markings at the bases posteriorly. IMPRESSION: No acute abnormality.  Chronic lung disease. Aortic atherosclerosis. Electronically Signed    By: Lorriane Shire M.D.   On: 11/10/2016 15:21    Procedures Procedures (including critical care time)  Medications Ordered in ED Medications - No data to display  Initial Impression / Assessment and Plan / ED Course  I have reviewed the triage vital signs and the nursing notes.  Pertinent labs & imaging results that were available during my care of the patient were reviewed by me and considered in my medical decision making (see chart for details).  Clinical Course    Patient's laboratory tests and chest x-ray are reassuring. No evidence of recurrent pneumonia. No evidence to suggest congestive heart failure. Patient does have a persistent cough. This may be related to a viral illness. The patient can continue to take guaifenesin. He may consider an over-the-counter decongestant. I wrote prescription for Tessalon to help with the symptoms. We discussed follow-up with his doctor next week  Final Clinical Impressions(s) / ED Diagnoses   Final diagnoses:  Cough    New Prescriptions New Prescriptions   BENZONATATE (TESSALON) 100 MG CAPSULE    Take 1 capsule (100 mg total) by mouth every 8 (eight) hours.     Dorie Rank, MD 11/10/16 [phone]

## 2016-11-10 NOTE — Telephone Encounter (Signed)
Patient aware.   Left message for son notifying of advice.

## 2016-11-10 NOTE — Discharge Instructions (Signed)
Continue your current medications. You  can also consider taking an over-the-counter decongestant.  Monitor for any signs of urinary difficulty because it can sometimes affect the prostate. Follow-up with your primary doctor next week to make sure symptoms are improving.

## 2016-11-10 NOTE — Telephone Encounter (Signed)
ED evaluation

## 2016-11-10 NOTE — ED Notes (Signed)
cbg of 139.

## 2016-11-11 ENCOUNTER — Telehealth: Payer: Self-pay

## 2016-11-11 DIAGNOSIS — E119 Type 2 diabetes mellitus without complications: Secondary | ICD-10-CM | POA: Diagnosis not present

## 2016-11-11 DIAGNOSIS — J8410 Pulmonary fibrosis, unspecified: Secondary | ICD-10-CM | POA: Diagnosis not present

## 2016-11-11 DIAGNOSIS — J189 Pneumonia, unspecified organism: Secondary | ICD-10-CM | POA: Diagnosis not present

## 2016-11-11 DIAGNOSIS — M1990 Unspecified osteoarthritis, unspecified site: Secondary | ICD-10-CM | POA: Diagnosis not present

## 2016-11-11 DIAGNOSIS — I10 Essential (primary) hypertension: Secondary | ICD-10-CM | POA: Diagnosis not present

## 2016-11-11 DIAGNOSIS — Z86711 Personal history of pulmonary embolism: Secondary | ICD-10-CM | POA: Diagnosis not present

## 2016-11-11 DIAGNOSIS — G309 Alzheimer's disease, unspecified: Secondary | ICD-10-CM | POA: Diagnosis not present

## 2016-11-11 DIAGNOSIS — F0280 Dementia in other diseases classified elsewhere without behavioral disturbance: Secondary | ICD-10-CM | POA: Diagnosis not present

## 2016-11-11 DIAGNOSIS — K810 Acute cholecystitis: Secondary | ICD-10-CM | POA: Diagnosis not present

## 2016-11-11 MED ORDER — TRAMADOL HCL 50 MG PO TABS: 50.0000 mg | ORAL_TABLET | Freq: Two times a day (BID) | ORAL | 1 refills | Status: DC | PRN

## 2016-11-11 NOTE — Telephone Encounter (Signed)
Patient aware and rx sent to pharmacy.  

## 2016-11-11 NOTE — Telephone Encounter (Signed)
Yes tramadol 50 mg one twice daily

## 2016-11-12 ENCOUNTER — Telehealth: Payer: Self-pay

## 2016-11-12 ENCOUNTER — Other Ambulatory Visit: Payer: Self-pay

## 2016-11-12 DIAGNOSIS — F0280 Dementia in other diseases classified elsewhere without behavioral disturbance: Secondary | ICD-10-CM | POA: Diagnosis not present

## 2016-11-12 DIAGNOSIS — J189 Pneumonia, unspecified organism: Secondary | ICD-10-CM | POA: Diagnosis not present

## 2016-11-12 DIAGNOSIS — I10 Essential (primary) hypertension: Secondary | ICD-10-CM | POA: Diagnosis not present

## 2016-11-12 DIAGNOSIS — J8410 Pulmonary fibrosis, unspecified: Secondary | ICD-10-CM | POA: Diagnosis not present

## 2016-11-12 DIAGNOSIS — G309 Alzheimer's disease, unspecified: Secondary | ICD-10-CM | POA: Diagnosis not present

## 2016-11-12 DIAGNOSIS — M1990 Unspecified osteoarthritis, unspecified site: Secondary | ICD-10-CM | POA: Diagnosis not present

## 2016-11-12 DIAGNOSIS — K810 Acute cholecystitis: Secondary | ICD-10-CM | POA: Diagnosis not present

## 2016-11-12 DIAGNOSIS — E119 Type 2 diabetes mellitus without complications: Secondary | ICD-10-CM | POA: Diagnosis not present

## 2016-11-12 DIAGNOSIS — Z86711 Personal history of pulmonary embolism: Secondary | ICD-10-CM | POA: Diagnosis not present

## 2016-11-12 MED ORDER — TRAMADOL HCL 50 MG PO TABS: 50.0000 mg | ORAL_TABLET | Freq: Three times a day (TID) | ORAL | 1 refills | Status: DC | PRN

## 2016-11-12 NOTE — Telephone Encounter (Signed)
Previous rx discarded and then was re-prescribed.  Pharmacy unable to fill unless signature is changed to reflect the need for early fill.

## 2016-11-12 NOTE — Telephone Encounter (Signed)
Pt able to get it cash pay

## 2016-11-12 NOTE — Telephone Encounter (Signed)
Ritta Slot called and said the pharmacy wouldn't fill the tramadol because it was too early. We threw out the prescription in the office and obrian wants to know what can be done. I know if the prescription is changed then it will probably go through. Please advise

## 2016-11-15 DIAGNOSIS — Z86711 Personal history of pulmonary embolism: Secondary | ICD-10-CM | POA: Diagnosis not present

## 2016-11-15 DIAGNOSIS — I10 Essential (primary) hypertension: Secondary | ICD-10-CM | POA: Diagnosis not present

## 2016-11-15 DIAGNOSIS — M1990 Unspecified osteoarthritis, unspecified site: Secondary | ICD-10-CM | POA: Diagnosis not present

## 2016-11-15 DIAGNOSIS — J189 Pneumonia, unspecified organism: Secondary | ICD-10-CM | POA: Diagnosis not present

## 2016-11-15 DIAGNOSIS — J8410 Pulmonary fibrosis, unspecified: Secondary | ICD-10-CM | POA: Diagnosis not present

## 2016-11-15 DIAGNOSIS — G309 Alzheimer's disease, unspecified: Secondary | ICD-10-CM | POA: Diagnosis not present

## 2016-11-15 DIAGNOSIS — F0280 Dementia in other diseases classified elsewhere without behavioral disturbance: Secondary | ICD-10-CM | POA: Diagnosis not present

## 2016-11-15 DIAGNOSIS — E119 Type 2 diabetes mellitus without complications: Secondary | ICD-10-CM | POA: Diagnosis not present

## 2016-11-15 DIAGNOSIS — K810 Acute cholecystitis: Secondary | ICD-10-CM | POA: Diagnosis not present

## 2016-11-16 ENCOUNTER — Telehealth: Payer: Self-pay

## 2016-11-16 ENCOUNTER — Other Ambulatory Visit: Payer: Self-pay | Admitting: Family Medicine

## 2016-11-16 DIAGNOSIS — M1990 Unspecified osteoarthritis, unspecified site: Secondary | ICD-10-CM | POA: Diagnosis not present

## 2016-11-16 DIAGNOSIS — E119 Type 2 diabetes mellitus without complications: Secondary | ICD-10-CM | POA: Diagnosis not present

## 2016-11-16 DIAGNOSIS — Z86711 Personal history of pulmonary embolism: Secondary | ICD-10-CM | POA: Diagnosis not present

## 2016-11-16 DIAGNOSIS — J189 Pneumonia, unspecified organism: Secondary | ICD-10-CM | POA: Diagnosis not present

## 2016-11-16 DIAGNOSIS — F0280 Dementia in other diseases classified elsewhere without behavioral disturbance: Secondary | ICD-10-CM | POA: Diagnosis not present

## 2016-11-16 DIAGNOSIS — J8410 Pulmonary fibrosis, unspecified: Secondary | ICD-10-CM | POA: Diagnosis not present

## 2016-11-16 DIAGNOSIS — K810 Acute cholecystitis: Secondary | ICD-10-CM | POA: Diagnosis not present

## 2016-11-16 DIAGNOSIS — I10 Essential (primary) hypertension: Secondary | ICD-10-CM | POA: Diagnosis not present

## 2016-11-16 DIAGNOSIS — G309 Alzheimer's disease, unspecified: Secondary | ICD-10-CM | POA: Diagnosis not present

## 2016-11-16 MED ORDER — PROMETHAZINE-DM 6.25-15 MG/5ML PO SYRP: ORAL_SOLUTION | ORAL | 0 refills | Status: DC

## 2016-11-16 MED ORDER — ALBUTEROL SULFATE HFA 108 (90 BASE) MCG/ACT IN AERS: INHALATION_SPRAY | RESPIRATORY_TRACT | 0 refills | Status: DC

## 2016-11-16 NOTE — Telephone Encounter (Signed)
Phenergan dm and albuterol MDI are prescribed, I also recommend referral to Dr Luan Pulling for eval and help with management, pls enter, I will sign, if son agrees

## 2016-11-16 NOTE — Telephone Encounter (Signed)
Patient aware.  Would like to hold on referral and see if medication helps.

## 2016-11-17 DIAGNOSIS — Z86711 Personal history of pulmonary embolism: Secondary | ICD-10-CM | POA: Diagnosis not present

## 2016-11-17 DIAGNOSIS — F0280 Dementia in other diseases classified elsewhere without behavioral disturbance: Secondary | ICD-10-CM | POA: Diagnosis not present

## 2016-11-17 DIAGNOSIS — J189 Pneumonia, unspecified organism: Secondary | ICD-10-CM | POA: Diagnosis not present

## 2016-11-17 DIAGNOSIS — M1990 Unspecified osteoarthritis, unspecified site: Secondary | ICD-10-CM | POA: Diagnosis not present

## 2016-11-17 DIAGNOSIS — K810 Acute cholecystitis: Secondary | ICD-10-CM | POA: Diagnosis not present

## 2016-11-17 DIAGNOSIS — E119 Type 2 diabetes mellitus without complications: Secondary | ICD-10-CM | POA: Diagnosis not present

## 2016-11-17 DIAGNOSIS — I10 Essential (primary) hypertension: Secondary | ICD-10-CM | POA: Diagnosis not present

## 2016-11-17 DIAGNOSIS — G309 Alzheimer's disease, unspecified: Secondary | ICD-10-CM | POA: Diagnosis not present

## 2016-11-17 DIAGNOSIS — J8410 Pulmonary fibrosis, unspecified: Secondary | ICD-10-CM | POA: Diagnosis not present

## 2016-11-18 ENCOUNTER — Telehealth: Payer: Self-pay | Admitting: Family Medicine

## 2016-11-18 ENCOUNTER — Telehealth: Payer: Self-pay

## 2016-11-18 DIAGNOSIS — D509 Iron deficiency anemia, unspecified: Secondary | ICD-10-CM | POA: Diagnosis not present

## 2016-11-18 DIAGNOSIS — Z794 Long term (current) use of insulin: Secondary | ICD-10-CM | POA: Diagnosis not present

## 2016-11-18 DIAGNOSIS — IMO0002 Reserved for concepts with insufficient information to code with codable children: Secondary | ICD-10-CM

## 2016-11-18 DIAGNOSIS — E1121 Type 2 diabetes mellitus with diabetic nephropathy: Secondary | ICD-10-CM | POA: Diagnosis not present

## 2016-11-18 DIAGNOSIS — E559 Vitamin D deficiency, unspecified: Secondary | ICD-10-CM

## 2016-11-18 DIAGNOSIS — I10 Essential (primary) hypertension: Secondary | ICD-10-CM | POA: Diagnosis not present

## 2016-11-18 DIAGNOSIS — E785 Hyperlipidemia, unspecified: Secondary | ICD-10-CM

## 2016-11-18 DIAGNOSIS — E1165 Type 2 diabetes mellitus with hyperglycemia: Secondary | ICD-10-CM

## 2016-11-18 DIAGNOSIS — D539 Nutritional anemia, unspecified: Secondary | ICD-10-CM | POA: Diagnosis not present

## 2016-11-18 LAB — VITAMIN B12: Vitamin B-12: 486 pg/mL (ref 200–1100)

## 2016-11-18 LAB — CBC
HCT: 34.7 % — ABNORMAL LOW (ref 38.5–50.0)
Hemoglobin: 11.1 g/dL — ABNORMAL LOW (ref 13.2–17.1)
MCH: 29.8 pg (ref 27.0–33.0)
MCHC: 32 g/dL (ref 32.0–36.0)
MCV: 93.3 fL (ref 80.0–100.0)
MPV: 8.4 fL (ref 7.5–12.5)
PLATELETS: 219 10*3/uL (ref 140–400)
RBC: 3.72 MIL/uL — AB (ref 4.20–5.80)
RDW: 13.8 % (ref 11.0–15.0)
WBC: 4.9 10*3/uL (ref 3.8–10.8)

## 2016-11-18 LAB — COMPLETE METABOLIC PANEL WITH GFR
ALBUMIN: 3.3 g/dL — AB (ref 3.6–5.1)
ALK PHOS: 67 U/L (ref 40–115)
ALT: 7 U/L — ABNORMAL LOW (ref 9–46)
AST: 11 U/L (ref 10–35)
BILIRUBIN TOTAL: 0.7 mg/dL (ref 0.2–1.2)
BUN: 22 mg/dL (ref 7–25)
CALCIUM: 8.4 mg/dL — AB (ref 8.6–10.3)
CO2: 27 mmol/L (ref 20–31)
Chloride: 104 mmol/L (ref 98–110)
Creat: 1.84 mg/dL — ABNORMAL HIGH (ref 0.70–1.11)
GFR, EST AFRICAN AMERICAN: 38 mL/min — AB (ref >=60)
GFR, EST NON AFRICAN AMERICAN: 33 mL/min — AB (ref >=60)
GLUCOSE: 138 mg/dL — AB (ref 65–99)
POTASSIUM: 4.7 mmol/L (ref 3.5–5.3)
SODIUM: 138 mmol/L (ref 135–146)
TOTAL PROTEIN: 6.3 g/dL (ref 6.1–8.1)

## 2016-11-18 LAB — HEMOGLOBIN A1C
HEMOGLOBIN A1C: 7.8 % — AB (ref <5.7)
Mean Plasma Glucose: 177 mg/dL

## 2016-11-18 LAB — FERRITIN: Ferritin: 482 ng/mL — ABNORMAL HIGH (ref 20–380)

## 2016-11-18 LAB — TSH: TSH: 0.69 m[IU]/L (ref 0.40–4.50)

## 2016-11-18 LAB — IRON: IRON: 41 ug/dL — AB (ref 50–180)

## 2016-11-18 NOTE — Telephone Encounter (Signed)
Opened in Errror

## 2016-11-18 NOTE — Telephone Encounter (Signed)
Labs prior to appt

## 2016-11-19 DIAGNOSIS — J8410 Pulmonary fibrosis, unspecified: Secondary | ICD-10-CM | POA: Diagnosis not present

## 2016-11-19 DIAGNOSIS — I10 Essential (primary) hypertension: Secondary | ICD-10-CM | POA: Diagnosis not present

## 2016-11-19 DIAGNOSIS — F0280 Dementia in other diseases classified elsewhere without behavioral disturbance: Secondary | ICD-10-CM | POA: Diagnosis not present

## 2016-11-19 DIAGNOSIS — K810 Acute cholecystitis: Secondary | ICD-10-CM | POA: Diagnosis not present

## 2016-11-19 DIAGNOSIS — E119 Type 2 diabetes mellitus without complications: Secondary | ICD-10-CM | POA: Diagnosis not present

## 2016-11-19 DIAGNOSIS — Z86711 Personal history of pulmonary embolism: Secondary | ICD-10-CM | POA: Diagnosis not present

## 2016-11-19 DIAGNOSIS — J189 Pneumonia, unspecified organism: Secondary | ICD-10-CM | POA: Diagnosis not present

## 2016-11-19 DIAGNOSIS — M1990 Unspecified osteoarthritis, unspecified site: Secondary | ICD-10-CM | POA: Diagnosis not present

## 2016-11-19 DIAGNOSIS — G309 Alzheimer's disease, unspecified: Secondary | ICD-10-CM | POA: Diagnosis not present

## 2016-11-19 LAB — VITAMIN D 25 HYDROXY (VIT D DEFICIENCY, FRACTURES): VIT D 25 HYDROXY: 44 ng/mL (ref 30–100)

## 2016-11-23 ENCOUNTER — Encounter: Payer: Self-pay | Admitting: Family Medicine

## 2016-11-23 ENCOUNTER — Ambulatory Visit (INDEPENDENT_AMBULATORY_CARE_PROVIDER_SITE_OTHER): Payer: Commercial Managed Care - HMO | Admitting: Family Medicine

## 2016-11-23 VITALS — BP 130/78 | HR 81 | Resp 16 | Ht 70.0 in | Wt 263.0 lb

## 2016-11-23 DIAGNOSIS — IMO0002 Reserved for concepts with insufficient information to code with codable children: Secondary | ICD-10-CM

## 2016-11-23 DIAGNOSIS — I10 Essential (primary) hypertension: Secondary | ICD-10-CM | POA: Diagnosis not present

## 2016-11-23 DIAGNOSIS — K810 Acute cholecystitis: Secondary | ICD-10-CM | POA: Diagnosis not present

## 2016-11-23 DIAGNOSIS — E559 Vitamin D deficiency, unspecified: Secondary | ICD-10-CM | POA: Diagnosis not present

## 2016-11-23 DIAGNOSIS — E785 Hyperlipidemia, unspecified: Secondary | ICD-10-CM

## 2016-11-23 DIAGNOSIS — M159 Polyosteoarthritis, unspecified: Secondary | ICD-10-CM

## 2016-11-23 DIAGNOSIS — Z86711 Personal history of pulmonary embolism: Secondary | ICD-10-CM | POA: Diagnosis not present

## 2016-11-23 DIAGNOSIS — E1165 Type 2 diabetes mellitus with hyperglycemia: Secondary | ICD-10-CM

## 2016-11-23 DIAGNOSIS — E119 Type 2 diabetes mellitus without complications: Secondary | ICD-10-CM | POA: Diagnosis not present

## 2016-11-23 DIAGNOSIS — G309 Alzheimer's disease, unspecified: Secondary | ICD-10-CM

## 2016-11-23 DIAGNOSIS — J8410 Pulmonary fibrosis, unspecified: Secondary | ICD-10-CM | POA: Diagnosis not present

## 2016-11-23 DIAGNOSIS — Z794 Long term (current) use of insulin: Secondary | ICD-10-CM

## 2016-11-23 DIAGNOSIS — F0280 Dementia in other diseases classified elsewhere without behavioral disturbance: Secondary | ICD-10-CM

## 2016-11-23 DIAGNOSIS — M1990 Unspecified osteoarthritis, unspecified site: Secondary | ICD-10-CM | POA: Diagnosis not present

## 2016-11-23 DIAGNOSIS — F0393 Unspecified dementia, unspecified severity, with mood disturbance: Secondary | ICD-10-CM

## 2016-11-23 DIAGNOSIS — E8809 Other disorders of plasma-protein metabolism, not elsewhere classified: Secondary | ICD-10-CM | POA: Insufficient documentation

## 2016-11-23 DIAGNOSIS — F329 Major depressive disorder, single episode, unspecified: Secondary | ICD-10-CM

## 2016-11-23 DIAGNOSIS — E1121 Type 2 diabetes mellitus with diabetic nephropathy: Secondary | ICD-10-CM

## 2016-11-23 DIAGNOSIS — J189 Pneumonia, unspecified organism: Secondary | ICD-10-CM | POA: Diagnosis not present

## 2016-11-23 NOTE — Progress Notes (Signed)
Samuel Ryan     MRN: [id number]      DOB: 11/13/1931   HPI Samuel Ryan is here for follow up and re-evaluation of chronic medical conditions, medication management and review of any available recent lab and radiology data.  Preventive health is updated, specifically  Cancer screening and Immunization.   Questions or concerns regarding consultations or procedures which the PT has had in the interim are  Addressed.Recently in ED for cough, this is better The PT denies any adverse reactions to current medications since the last visit.  Increased right shoulder pain with limited mobility  There are no specific complaints Denies polyuria, polydipsia, blurred vision , or hypoglycemic episodes.   ROS Denies recent fever or chills. Denies sinus pressure, nasal congestion, ear pain or sore throat. Denies chest congestion, productive cough or wheezing. Denies chest pains, palpitations and leg swelling Denies abdominal pain, nausea, vomiting,diarrhea or constipation.   Denies dysuria, frequency, hesitancy or incontinence. C/o joint pain, swelling and limitation in mobility. Denies headaches, seizures, numbness, or tingling. Denies  Uncontrolled depression, anxiety or insomnia. Denies skin break down or rash.   PE  BP 130/78   Pulse 81   Resp 16   Ht 5\' 10"  (1.778 m)   Wt 263 lb (119.3 kg)   SpO2 92%   BMI 37.74 kg/m   Patient alert and oriented and in no cardiopulmonary distress.  HEENT: No facial asymmetry, EOMI,   oropharynx pink and moist.  Neck supple no JVD, no mass.  Chest: Clear to auscultation bilaterally.  CVS: S1, S2 no murmurs, no S3.Regular rate.  ABD: Soft non tender.   Ext: No edema  MS: decreased  ROM spine, shoulders, hips and knees.  Skin: Intact, no ulcerations or rash noted.  Psych: Good eye contact, normal affect. Memory intact not anxious or depressed appearing.  CNS: CN 2-12 intact, power,  normal throughout.no focal deficits noted.   Assessment  & Plan  Dementia of the Alzheimer's type without behavioral disturbance  Inappropriate speech and behavior progressively worsening, no med change, would benefit from group therapy , son still trying to get this organized  Depression due to dementia Controlled, no change in medication   Type 2 diabetes, uncontrolled, with renal manifestation Improved, no nmec hange Samuel Ryan is reminded of the importance of commitment to daily physical activity for 30 minutes or more, as able and the need to limit carbohydrate intake to 30 to 60 grams per meal to help with blood sugar control.   The need to take medication as prescribed, test blood sugar as directed, and to call between visits if there is a concern that blood sugar is uncontrolled is also discussed.   Samuel Ryan is reminded of the importance of daily foot exam, annual eye examination, and good blood sugar, blood pressure and cholesterol control.  Diabetic Labs Latest Ref Rng & Units 11/18/2016 11/10/2016 10/30/2016 10/28/2016 09/10/2016  HbA1c <5.7 % 7.8(H) - - - -  Microalbumin Not estab mg/dL - - - - -  Micro/Creat Ratio <30 mcg/mg creat - - - - -  Chol 125 - 200 mg/dL - - - - -  HDL >=40 mg/dL - - - - -  Calc LDL <130 mg/dL - - - - -  Triglycerides <150 mg/dL - - - - -  Creatinine 0.70 - 1.11 mg/dL 1.84(H) 1.77(H) 1.82(H) 2.06(H) 1.80(H)   BP/Weight 11/23/2016 11/10/2016 11/04/2016 10/30/2016 10/28/2016 09/10/2016 [id number]  Systolic BP [id number] 0000000 [id number] AB-123456789 - 164 155  Diastolic BP 78 83 72 61 - 90 80  Wt. (Lbs) 263 269 269 - 240 259 259  BMI 37.74 38.6 38.6 - 34.44 37.16 37.16   Foot/eye exam completion dates Latest Ref Rng & Units 09/22/2015 09/22/2015  Eye Exam No Retinopathy No Retinopathy No Retinopathy  Foot Form Completion - - -   Microalb from office today   miu  Generalized osteoarthritis Chronic, progressively worsening, had to add back tramadol within 1 week of discontinuing same Home safety revised with son, no falls  reported  Hyperlipidemia with target LDL less than 100 Hyperlipidemia:Low fat diet discussed and encouraged.   Lipid Panel  Lab Results  Component Value Date   CHOL 133 08/12/2016   HDL 44 08/12/2016   LDLCALC 70 08/12/2016   TRIG 95 08/12/2016   CHOLHDL 3.0 08/12/2016   Controlled, no change in medication Updated lab needed at/ before next visit.     Hypoalbuminemia Will attempt to get once daily glucerna, mildly malnourished

## 2016-11-23 NOTE — Assessment & Plan Note (Signed)
Hyperlipidemia:Low fat diet discussed and encouraged.   Lipid Panel  Lab Results  Component Value Date   CHOL 133 08/12/2016   HDL 44 08/12/2016   LDLCALC 70 08/12/2016   TRIG 95 08/12/2016   CHOLHDL 3.0 08/12/2016   Controlled, no change in medication Updated lab needed at/ before next visit.

## 2016-11-23 NOTE — Assessment & Plan Note (Signed)
Will attempt to get once daily glucerna, mildly malnourished

## 2016-11-23 NOTE — Assessment & Plan Note (Signed)
Inappropriate speech and behavior progressively worsening, no med change, would benefit from group therapy , son still trying to get this organized

## 2016-11-23 NOTE — Assessment & Plan Note (Signed)
Controlled, no change in medication  

## 2016-11-23 NOTE — Assessment & Plan Note (Signed)
Chronic, progressively worsening, had to add back tramadol within 1 week of discontinuing same Home safety revised with son, no falls reported

## 2016-11-23 NOTE — Patient Instructions (Addendum)
Annual wellness end January, call if you need me before  Annual physical exam in 4 months Fasting lipid, cmp and eGFR, hBa1c and vit D  And iron in 4 months  Improved blood sugar, excellent, no med change  Microalb from office today  We will try to get glucerna one daily supplement for you as your albumin is low., we will contact your son about this.  Call for referral to orthopedics for right shoulder pain with reduced mobility once  you decide on this  Happy 50th in next week and a half! 5 and two zeros1!!!  Thank you  for choosing [facility]. We consider it a privelige to serve you.  Delivering excellent health care in a caring and  compassionate way is our goal.  Partnering with you,  so that together we can achieve this goal is our strategy.

## 2016-11-23 NOTE — Assessment & Plan Note (Signed)
Improved, no nmec hange Samuel Ryan is reminded of the importance of commitment to daily physical activity for 30 minutes or more, as able and the need to limit carbohydrate intake to 30 to 60 grams per meal to help with blood sugar control.   The need to take medication as prescribed, test blood sugar as directed, and to call between visits if there is a concern that blood sugar is uncontrolled is also discussed.   Samuel Ryan is reminded of the importance of daily foot exam, annual eye examination, and good blood sugar, blood pressure and cholesterol control.  Diabetic Labs Latest Ref Rng & Units 11/18/2016 11/10/2016 10/30/2016 10/28/2016 09/10/2016  HbA1c <5.7 % 7.8(H) - - - -  Microalbumin Not estab mg/dL - - - - -  Micro/Creat Ratio <30 mcg/mg creat - - - - -  Chol 125 - 200 mg/dL - - - - -  HDL >=40 mg/dL - - - - -  Calc LDL <130 mg/dL - - - - -  Triglycerides <150 mg/dL - - - - -  Creatinine 0.70 - 1.11 mg/dL 1.84(H) 1.77(H) 1.82(H) 2.06(H) 1.80(H)   BP/Weight 11/23/2016 11/10/2016 11/04/2016 10/30/2016 10/28/2016 09/10/2016 [id number]  Systolic BP [id number] 0000000 [id number] AB-123456789 - 123456 [id number]  Diastolic BP 78 83 72 61 - 90 80  Wt. (Lbs) 263 269 269 - 240 259 259  BMI 37.74 38.6 38.6 - 34.44 37.16 37.16   Foot/eye exam completion dates Latest Ref Rng & Units 09/22/2015 09/22/2015  Eye Exam No Retinopathy No Retinopathy No Retinopathy  Foot Form Completion - - -   Microalb from office today   miu

## 2016-11-24 ENCOUNTER — Ambulatory Visit (INDEPENDENT_AMBULATORY_CARE_PROVIDER_SITE_OTHER): Payer: Commercial Managed Care - HMO | Admitting: Urology

## 2016-11-24 ENCOUNTER — Other Ambulatory Visit (HOSPITAL_COMMUNITY)
Admission: RE | Admit: 2016-11-24 | Discharge: 2016-11-24 | Disposition: A | Discharge status: Home / Self Care | Payer: Commercial Managed Care - HMO | Source: Ambulatory Visit | Attending: Family Medicine | Admitting: Family Medicine

## 2016-11-24 DIAGNOSIS — E1165 Type 2 diabetes mellitus with hyperglycemia: Secondary | ICD-10-CM | POA: Diagnosis not present

## 2016-11-24 DIAGNOSIS — N401 Enlarged prostate with lower urinary tract symptoms: Secondary | ICD-10-CM

## 2016-11-24 DIAGNOSIS — R3915 Urgency of urination: Secondary | ICD-10-CM | POA: Diagnosis not present

## 2016-11-25 DIAGNOSIS — F0280 Dementia in other diseases classified elsewhere without behavioral disturbance: Secondary | ICD-10-CM | POA: Diagnosis not present

## 2016-11-25 DIAGNOSIS — K810 Acute cholecystitis: Secondary | ICD-10-CM | POA: Diagnosis not present

## 2016-11-25 DIAGNOSIS — I10 Essential (primary) hypertension: Secondary | ICD-10-CM | POA: Diagnosis not present

## 2016-11-25 DIAGNOSIS — J8410 Pulmonary fibrosis, unspecified: Secondary | ICD-10-CM | POA: Diagnosis not present

## 2016-11-25 DIAGNOSIS — G309 Alzheimer's disease, unspecified: Secondary | ICD-10-CM | POA: Diagnosis not present

## 2016-11-25 DIAGNOSIS — Z86711 Personal history of pulmonary embolism: Secondary | ICD-10-CM | POA: Diagnosis not present

## 2016-11-25 DIAGNOSIS — E119 Type 2 diabetes mellitus without complications: Secondary | ICD-10-CM | POA: Diagnosis not present

## 2016-11-25 DIAGNOSIS — J189 Pneumonia, unspecified organism: Secondary | ICD-10-CM | POA: Diagnosis not present

## 2016-11-25 DIAGNOSIS — M1990 Unspecified osteoarthritis, unspecified site: Secondary | ICD-10-CM | POA: Diagnosis not present

## 2016-11-25 LAB — MICROALBUMIN / CREATININE URINE RATIO
CREATININE, UR: 120.7 mg/dL
MICROALB UR: 26.9 ug/mL — AB
MICROALB/CREAT RATIO: 22.3 mg/g{creat} (ref 0.0–30.0)

## 2016-11-26 DIAGNOSIS — Z09 Encounter for follow-up examination after completed treatment for conditions other than malignant neoplasm: Secondary | ICD-10-CM | POA: Insufficient documentation

## 2016-11-26 DIAGNOSIS — R600 Localized edema: Secondary | ICD-10-CM | POA: Insufficient documentation

## 2016-11-26 NOTE — Progress Notes (Signed)
   Samuel Ryan     MRN: [id number]      DOB: Apr 23, 1931   HPI [name]  Patient in for follow up of recent hospitalization. Discharge summary, and laboratory and radiology data are reviewed, and any questions or concerns about recent hospitalization are discussed. Specific issues requiring follow up are specifically addressed. Pt was hospitalized from 11/30 to 10/30/2016 with community acquired pneumonia, d/c home on antibiotics with in home Pt/OT Telephone contact made 2 days following d/c with his son, who requested an appt the week following d/c , initial recommendation by Hospitalist was for 2 week f/u. Father's c/p status is fragile and son is sole caregiver  ROS Denies current fever or chills.c/o generalized weakness Denies sinus pressure, nasal congestion, ear pain or sore throat. Denies chest congestion, productive cough or wheezing. Denies chest pains, palpitations c/o leg swelling Denies abdominal pain, nausea, vomiting,diarrhea    Denies dysuria, frequency, hesitancy or incontinence. C/o chronic  joint pain, swelling and limitation in mobility. Due to weakness and constipation issues wishes/ willing to try tylenol only for pain Denies headaches, seizures, numbness, or tingling. Denies depression, anxiety or insomnia. Denies skin break down or rash.   PE  BP 140/72   Pulse 87   Resp 16   Ht 5\' 10"  (1.778 m)   Wt 269 lb (122 kg)   SpO2 90%   BMI 38.60 kg/m   Patient alert and oriented and in no cardiopulmonary distress.  HEENT: No facial asymmetry, EOMI,   oropharynx pink and moist.  Neck supple no JVD, no mass.  Chest: adequate air entry , few basilar crackles, no wheezes  CVS: S1, S2 no murmurs, no S3.Regular rate.  ABD: Soft non tender.   Ext:  2 plus pitting lower extremity edema  MS: Adequate ROM spine, shoulders, hips and knees.  Skin: Intact, no ulcerations or rash noted.  Psych: Good eye contact, normal affect. Memory impaired not anxious or  depressed appearing.  CNS: CN 2-12 intact, power,  normal throughout.no focal deficits noted.   [facility] discharge follow-up Clinically improving, still reports cough , less dyspneic, appetite improved, still somewhat weak, pt to complete antibiotic course discharged on. Denies fever or chills since discharge  Bilateral leg edema Leg edema with weight gain, short course of lasix and leg elevation, will re evaluate in 3 weeks  At high risk for falls Home safety and fall risk reduction discussed  Essential hypertension Controlled, no change in medication   Alzheimer's dementia with behavioral disturbance Inappropriate behavior especially in conversation noted, no aggressive behavior on record.Continue current medication  Depression due to dementia Controlled, no change in medication   Generalized osteoarthritis Discontinue tramadol, pt to use tylenol for pain management

## 2016-11-26 NOTE — Assessment & Plan Note (Signed)
Inappropriate behavior especially in conversation noted, no aggressive behavior on record.Continue current medication

## 2016-11-26 NOTE — Assessment & Plan Note (Signed)
Leg edema with weight gain, short course of lasix and leg elevation, will re evaluate in 3 weeks

## 2016-11-26 NOTE — Assessment & Plan Note (Signed)
Controlled, no change in medication  

## 2016-11-26 NOTE — Assessment & Plan Note (Signed)
Discontinue tramadol, pt to use tylenol for pain management

## 2016-11-26 NOTE — Assessment & Plan Note (Signed)
Clinically improving, still reports cough , less dyspneic, appetite improved, still somewhat weak, pt to complete antibiotic course discharged on. Denies fever or chills since discharge

## 2016-11-26 NOTE — Assessment & Plan Note (Signed)
Home safety and fall risk reduction discussed 

## 2016-12-01 ENCOUNTER — Ambulatory Visit (INDEPENDENT_AMBULATORY_CARE_PROVIDER_SITE_OTHER): Payer: Commercial Managed Care - HMO

## 2016-12-01 VITALS — BP 150/80 | HR 78 | Temp 97.6°F | Resp 16 | Ht 70.0 in | Wt 265.0 lb

## 2016-12-01 DIAGNOSIS — Z0000 Encounter for general adult medical examination without abnormal findings: Secondary | ICD-10-CM | POA: Diagnosis not present

## 2016-12-01 NOTE — Progress Notes (Signed)
Subjective:   SUEDE Ryan is a 83 y.o. male who presents for Medicare Annual/Subsequent preventive examination.  Review of Systems:  Cardiac Risk Factors include: advanced age (>67men, >49 women);diabetes mellitus;hypertension;obesity (BMI >30kg/m2);family history of premature cardiovascular disease;male gender     Objective:    Vitals: Pulse 78   Temp 97.6 F (36.4 C) (Oral)   Resp 16   Ht 5\' 10"  (1.778 m)   Wt 265 lb 0.6 oz (120.2 kg)   SpO2 90%   BMI 38.03 kg/m   Body mass index is 38.03 kg/m.  Tobacco History  Smoking Status  . Never Smoker  Smokeless Tobacco  . Never Used     Counseling given: Not Answered   Past Medical History:  Diagnosis Date  . Acute cholecystitis 12/12/2012 to 12/19/2012   Drained percutaneously  . BPH (benign prostatic hypertrophy)   . Deep venous thrombosis (HCC)    Left leg, diagnosed 7/12  . Dementia    mild /needs supervision for safety per son -POA  . Diverticulitis   . Essential hypertension, benign   . History of pneumonia    prior to 2012  . Incontinence of urine    wears depends  . Mixed hyperlipidemia   . Osteoarthritis   . Pulmonary embolism ([place])    Diagnosed 7/12  . Type 2 diabetes mellitus ([place])    Past Surgical History:  Procedure Laterality Date  . CARPAL TUNNEL RELEASE    . COLONOSCOPY  08/31/2012   Procedure: COLONOSCOPY;  Surgeon: Rogene Houston, MD;  Location: [facility];  Service: Endoscopy;  Laterality: N/A;  830  . CYSTOSCOPY WITH INSERTION OF UROLIFT N/A 09/02/2016   Procedure: CYSTOSCOPY WITH INSERTION OF UROLIFT;  Surgeon: Cleon Gustin, MD;  Location: [facility];  Service: Urology;  Laterality: N/A;  . EYE SURGERY Bilateral    bilateral cataract  . Right total knee replacement  04/23/2011  . SPINE SURGERY  prior to 2012   Neck fusion  . UMBILICAL HERNIA REPAIR     umbilical   Family History  Problem Relation Age of Onset  . Cancer Mother     Stomach  . Diabetes  Sister   . Hypertension Sister   . Diabetes Brother   . Cancer Brother     Gallbladder  . Seizures Son     due to meningitis as a child   History  Sexual Activity  . Sexual activity: No    Outpatient Encounter Prescriptions as of 12/01/2016  Medication Sig  . acetaminophen (TYLENOL) 500 MG tablet Take 1,000 mg by mouth every 6 (six) hours as needed for mild pain, moderate pain or headache.   . albuterol (PROVENTIL HFA;VENTOLIN HFA) 108 (90 Base) MCG/ACT inhaler Two puffs at bedtime , as needed, for excessive cough  . Carboxymethylcellul-Glycerin 0.5-0.9 % SOLN Place 1 drop into both eyes as needed (for dry eyes).  . Dextromethorphan-Guaifenesin (MUCINEX DM MAXIMUM STRENGTH PO) Take 10 mLs by mouth 2 (two) times daily as needed.   . donepezil (ARICEPT) 10 MG tablet Take 10 mg by mouth at bedtime.  . ferrous sulfate 325 (65 FE) MG tablet Take 325 mg by mouth daily with breakfast.  . fluticasone (FLONASE) 50 MCG/ACT nasal spray Place 2 sprays into both nostrils daily as needed for rhinitis.  [name] glipiZIDE (GLUCOTROL XL) 10 MG 24 hr tablet Take 10 mg by mouth daily with breakfast.  . insulin glargine (LANTUS) 100 unit/mL SOPN Inject 15 Units into the skin  daily.  . linagliptin (TRADJENTA) 5 MG TABS tablet Take 1 tablet (5 mg total) by mouth daily.  [name] loratadine (CLARITIN) 10 MG tablet Take 10 mg by mouth daily.   [name] lovastatin (MEVACOR) 40 MG tablet Take 1 tablet (40 mg total) by mouth at bedtime.  . mirabegron ER (MYRBETRIQ) 50 MG TB24 tablet Take 50 mg by mouth daily.   [name] omeprazole (PRILOSEC) 40 MG capsule Take 1 capsule (40 mg total) by mouth daily.  . promethazine-dextromethorphan (PROMETHAZINE-DM) 6.25-15 MG/5ML syrup One teaspoon at bedtime, as needed, for excessive cough  . sertraline (ZOLOFT) 50 MG tablet Take 50 mg by mouth daily.  . tamsulosin (FLOMAX) 0.4 MG CAPS capsule Take 1 capsule (0.4 mg total) by mouth daily.  . traMADol (ULTRAM) 50 MG tablet Take 1 tablet (50 mg total) by  mouth every 8 (eight) hours as needed. Maximum daily dose 3 tabs  . Vitamin D, Ergocalciferol, (DRISDOL) 50000 units CAPS capsule Take 50,000 Units by mouth every Friday.  . [DISCONTINUED] furosemide (LASIX) 20 MG tablet Take 1 tablet (20 mg total) by mouth daily. (Patient not taking: Reported on 11/23/2016)   No facility-administered encounter medications on file as of 12/01/2016.     Activities of Daily Living In your present state of health, do you have any difficulty performing the following activities: 12/01/2016 10/29/2016  Hearing? N N  Vision? N N  Difficulty concentrating or making decisions? Tempie Donning  Walking or climbing stairs? Y Y  Dressing or bathing? Y Y  Doing errands, shopping? Tempie Donning  Preparing Food and eating ? Y -  Using the Toilet? N -  In the past six months, have you accidently leaked urine? N -  Do you have problems with loss of bowel control? N -  Managing your Medications? Y -  Managing your Finances? Y -  Housekeeping or managing your Housekeeping? Y -  Some recent data might be hidden    Patient Care Team: [name], MD as PCP - General (Family Medicine) Lowella Bandy, MD as Consulting Physician (Urology)   Assessment:    Exercise Activities and Dietary recommendations Current Exercise Habits: Home exercise routine, Type of exercise: stretching, Time (Minutes): 30, Frequency (Times/Week): 5, Weekly Exercise (Minutes/Week): 150, Intensity: Mild  Goals    . Exercise 3x per week (30 min per time)          Starting 12/02/2016 patient would like to exercise more. He would like to start going to silver sneakers again.      Fall Risk Fall Risk  12/01/2016 11/27/2015 04/22/2015 03/24/2015 12/30/2014  Falls in the past year? No No No No No  Risk for fall due to : - - Impaired balance/gait - -   Depression Screen PHQ 2/9 Scores 12/01/2016 11/23/2016 07/10/2015 04/22/2015  PHQ - 2 Score 0 0 2 2  PHQ- 9 Score - 0 6 7  Exception Documentation - - - -  Not completed - - - -     Cognitive Function MMSE - Mini Mental State Exam 12/01/2016  Orientation to time 5  Orientation to Place 5  Registration 3  Attention/ Calculation 5  Recall 0  Language- name 2 objects 2  Language- repeat 1  Language- follow 3 step command 3  Language- read & follow direction 1  Write a sentence 1  Copy design 1  Total score 27        Immunization History  Administered Date(s) Administered  . Influenza Split 10/14/2011, 09/26/2012, 09/29/2013  .  Influenza Whole 09/07/2007, 09/04/2009, 08/04/2010  . Influenza,inj,Quad PF,36+ Mos 09/09/2014, 08/13/2016  . Pneumococcal Conjugate-13 07/16/2014  . Pneumococcal Polysaccharide-23 08/04/2010  . Td 07/28/2004  . Zoster 03/08/2007   Screening Tests Health Maintenance  Topic Date Due  . OPHTHALMOLOGY EXAM  09/21/2016  . TETANUS/TDAP  10/20/2017 (Originally 07/28/2014)  . HEMOGLOBIN A1C  05/19/2017  . FOOT EXAM  11/04/2017  . URINE MICROALBUMIN  11/24/2017  . INFLUENZA VACCINE  Completed  . ZOSTAVAX  Completed  . PNA vac Low Risk Adult  Completed      Plan:    I have personally reviewed and addressed the Medicare Annual Wellness questionnaire and have noted the following in the patient's chart:  A. Medical and social history B. Use of alcohol, tobacco or illicit drugs  C. Current medications and supplements D. Functional ability and status E.  Nutritional status F.  Physical activity G. Advance directives H. List of other physicians I.  Hospitalizations, surgeries, and ER visits in previous 12 months J.  [place] to include hearing, vision, cognitive, depression L. Referrals and appointments  In addition, I have reviewed and discussed with patient certain preventive protocols, quality metrics, and best practice recommendations. A written personalized care plan for preventive services as well as general preventive health recommendations were provided to patient.  Signed,   Stormy Fabian, LPN Lead  Nurse Health Advisor

## 2016-12-01 NOTE — Patient Instructions (Addendum)
Health maintenance: Due for his diabetic eye exam, please call and schedule as soon as possible. Due for Tetanus, patient to check with insurance company to see if this will be covered.   Abnormal screenings: None   Patient concerns: None   Nurse concerns: None   Next PCP appt: With Dr. Moshe Cipro on 03/01/2017 at 3:40 pm   Health Maintenance, Male A healthy lifestyle and preventative care can promote health and wellness.  Maintain regular health, dental, and eye exams.  Eat a healthy diet. Foods like vegetables, fruits, whole grains, low-fat dairy products, and lean protein foods contain the nutrients you need and are low in calories. Decrease your intake of foods high in solid fats, added sugars, and salt. Get information about a proper diet from your health care provider, if necessary.  Regular physical exercise is one of the most important things you can do for your health. Most adults should get at least 150 minutes of moderate-intensity exercise (any activity that increases your heart rate and causes you to sweat) each week. In addition, most adults need muscle-strengthening exercises on 2 or more days a week.   Maintain a healthy weight. The body mass index (BMI) is a screening tool to identify possible weight problems. It provides an estimate of body fat based on height and weight. Your health care provider can find your BMI and can help you achieve or maintain a healthy weight. For males 20 years and older:  A BMI below 18.5 is considered underweight.  A BMI of 18.5 to 24.9 is normal.  A BMI of 25 to 29.9 is considered overweight.  A BMI of 30 and above is considered obese.  Maintain normal blood lipids and cholesterol by exercising and minimizing your intake of saturated fat. Eat a balanced diet with plenty of fruits and vegetables. Blood tests for lipids and cholesterol should begin at age 71 and be repeated every 5 years. If your lipid or cholesterol levels are high, you are  over age 25, or you are at high risk for heart disease, you may need your cholesterol levels checked more frequently.Ongoing high lipid and cholesterol levels should be treated with medicines if diet and exercise are not working.  If you smoke, find out from your health care provider how to quit. If you do not use tobacco, do not start.  Lung cancer screening is recommended for adults aged 88-80 years who are at high risk for developing lung cancer because of a history of smoking. A yearly low-dose CT scan of the lungs is recommended for people who have at least a 30-pack-year history of smoking and are current smokers or have quit within the past 15 years. A pack year of smoking is smoking an average of 1 pack of cigarettes a day for 1 year (for example, a 30-pack-year history of smoking could mean smoking 1 pack a day for 30 years or 2 packs a day for 15 years). Yearly screening should continue until the smoker has stopped smoking for at least 15 years. Yearly screening should be stopped for people who develop a health problem that would prevent them from having lung cancer treatment.  If you choose to drink alcohol, do not have more than 2 drinks per day. One drink is considered to be 12 oz (360 mL) of beer, 5 oz (150 mL) of wine, or 1.5 oz (45 mL) of liquor.  Avoid the use of street drugs. Do not share needles with anyone. Ask for help if you  need support or instructions about stopping the use of drugs.  High blood pressure causes heart disease and increases the risk of stroke. High blood pressure is more likely to develop in:  People who have blood pressure in the end of the normal range (100-139/85-89 mm Hg).  People who are overweight or obese.  People who are African American.  If you are 34-82 years of age, have your blood pressure checked every 3-5 years. If you are 24 years of age or older, have your blood pressure checked every year. You should have your blood pressure measured  twice-once when you are at a hospital or clinic, and once when you are not at a hospital or clinic. Record the average of the two measurements. To check your blood pressure when you are not at a hospital or clinic, you can use:  An automated blood pressure machine at a pharmacy.  A home blood pressure monitor.  If you are 8-32 years old, ask your health care provider if you should take aspirin to prevent heart disease.  Diabetes screening involves taking a blood sample to check your fasting blood sugar level. This should be done once every 3 years after age 20 if you are at a normal weight and without risk factors for diabetes. Testing should be considered at a younger age or be carried out more frequently if you are overweight and have at least 1 risk factor for diabetes.  Colorectal cancer can be detected and often prevented. Most routine colorectal cancer screening begins at the age of 73 and continues through age 107. However, your health care provider may recommend screening at an earlier age if you have risk factors for colon cancer. On a yearly basis, your health care provider may provide home test kits to check for hidden blood in the stool. A small camera at the end of a tube may be used to directly examine the colon (sigmoidoscopy or colonoscopy) to detect the earliest forms of colorectal cancer. Talk to your health care provider about this at age 46 when routine screening begins. A direct exam of the colon should be repeated every 5-10 years through age 37, unless early forms of precancerous polyps or small growths are found.  People who are at an increased risk for hepatitis B should be screened for this virus. You are considered at high risk for hepatitis B if:  You were born in a country where hepatitis B occurs often. Talk with your health care provider about which countries are considered high risk.  Your parents were born in a high-risk country and you have not received a shot to  protect against hepatitis B (hepatitis B vaccine).  You have HIV or AIDS.  You use needles to inject street drugs.  You live with, or have sex with, someone who has hepatitis B.  You are a man who has sex with other men (MSM).  You get hemodialysis treatment.  You take certain medicines for conditions like cancer, organ transplantation, and autoimmune conditions.  Hepatitis C blood testing is recommended for all people born from [birth year] through 1963 and any individual with known risk factors for hepatitis C.  Healthy men should no longer receive prostate-specific antigen (PSA) blood tests as part of routine cancer screening. Talk to your health care provider about prostate cancer screening.  Testicular cancer screening is not recommended for adolescents or adult males who have no symptoms. Screening includes self-exam, a health care provider exam, and other screening tests. Consult with  your health care provider about any symptoms you have or any concerns you have about testicular cancer.  Practice safe sex. Use condoms and avoid high-risk sexual practices to reduce the spread of sexually transmitted infections (STIs).  You should be screened for STIs, including gonorrhea and chlamydia if:  You are sexually active and are younger than 24 years.  You are older than 24 years, and your health care provider tells you that you are at risk for this type of infection.  Your sexual activity has changed since you were last screened, and you are at an increased risk for chlamydia or gonorrhea. Ask your health care provider if you are at risk.  If you are at risk of being infected with HIV, it is recommended that you take a prescription medicine daily to prevent HIV infection. This is called pre-exposure prophylaxis (PrEP). You are considered at risk if:  You are a man who has sex with other men (MSM).  You are a heterosexual man who is sexually active with multiple partners.  You take drugs by  injection.  You are sexually active with a partner who has HIV.  Talk with your health care provider about whether you are at high risk of being infected with HIV. If you choose to begin PrEP, you should first be tested for HIV. You should then be tested every 3 months for as long as you are taking PrEP.  Use sunscreen. Apply sunscreen liberally and repeatedly throughout the day. You should seek shade when your shadow is shorter than you. Protect yourself by wearing long sleeves, pants, a wide-brimmed hat, and sunglasses year round whenever you are outdoors.  Tell your health care provider of new moles or changes in moles, especially if there is a change in shape or color. Also, tell your health care provider if a mole is larger than the size of a pencil eraser.  A one-time screening for abdominal aortic aneurysm (AAA) and surgical repair of large AAAs by ultrasound is recommended for men aged 77-75 years who are current or former smokers.  Stay current with your vaccines (immunizations). This information is not intended to replace advice given to you by your health care provider. Make sure you discuss any questions you have with your health care provider. Document Released: 05/13/2008 Document Revised: 12/06/2014 Document Reviewed: 08/19/2015 Elsevier Interactive Patient Education  2017 [employer].

## 2017-01-05 ENCOUNTER — Other Ambulatory Visit: Payer: Self-pay | Admitting: Family Medicine

## 2017-01-08 ENCOUNTER — Other Ambulatory Visit: Payer: Self-pay | Admitting: Family Medicine

## 2017-01-10 NOTE — Telephone Encounter (Signed)
Last vit d was 44 on 12 27 17  Do you want to refill

## 2017-01-12 ENCOUNTER — Other Ambulatory Visit: Payer: Self-pay

## 2017-01-12 MED ORDER — TRAMADOL HCL 50 MG PO TABS: 50.0000 mg | ORAL_TABLET | Freq: Three times a day (TID) | ORAL | 1 refills | Status: DC | PRN

## 2017-02-05 ENCOUNTER — Other Ambulatory Visit: Payer: Self-pay | Admitting: Family Medicine

## 2017-02-07 NOTE — Telephone Encounter (Signed)
Continue test supplies once they are covered. Discontinue weekly vit D pls and advise daily oTC vit D3 1000iU

## 2017-02-07 NOTE — Telephone Encounter (Signed)
Last vit d level was 44 on 12 21 17  Do you want to reorder?

## 2017-02-08 ENCOUNTER — Other Ambulatory Visit: Payer: Self-pay

## 2017-02-08 ENCOUNTER — Telehealth: Payer: Self-pay | Admitting: Family Medicine

## 2017-02-08 MED ORDER — PEN NEEDLES 31G X 5 MM MISC: 1.0000 | 5 refills | Status: DC

## 2017-02-08 NOTE — Telephone Encounter (Signed)
Pls verify with pt that he has his test supplies, a recent order was declined and I just need a follow up on this, if he does not, pls send in with same directions as before, thanks  ?? pls ask

## 2017-02-11 ENCOUNTER — Other Ambulatory Visit: Payer: Self-pay

## 2017-02-11 MED ORDER — VITAMIN D (ERGOCALCIFEROL) 1.25 MG (50000 UNIT) PO CAPS: ORAL_CAPSULE | ORAL | 5 refills | Status: DC

## 2017-02-11 NOTE — Telephone Encounter (Signed)
Called and left message to call back if patient needed testing supplies

## 2017-02-18 ENCOUNTER — Other Ambulatory Visit: Payer: Self-pay | Admitting: Family Medicine

## 2017-02-21 ENCOUNTER — Emergency Department (HOSPITAL_COMMUNITY): Payer: Commercial Managed Care - HMO

## 2017-02-21 ENCOUNTER — Emergency Department (HOSPITAL_COMMUNITY)
Admission: EM | Admit: 2017-02-21 | Discharge: 2017-02-21 | Disposition: A | Discharge status: Home / Self Care | Payer: Commercial Managed Care - HMO | Attending: Emergency Medicine | Admitting: Emergency Medicine

## 2017-02-21 ENCOUNTER — Encounter (HOSPITAL_COMMUNITY): Payer: Self-pay | Admitting: *Deleted

## 2017-02-21 DIAGNOSIS — R358 Other polyuria: Secondary | ICD-10-CM | POA: Diagnosis not present

## 2017-02-21 DIAGNOSIS — I10 Essential (primary) hypertension: Secondary | ICD-10-CM | POA: Insufficient documentation

## 2017-02-21 DIAGNOSIS — E119 Type 2 diabetes mellitus without complications: Secondary | ICD-10-CM | POA: Diagnosis not present

## 2017-02-21 DIAGNOSIS — R55 Syncope and collapse: Secondary | ICD-10-CM | POA: Diagnosis not present

## 2017-02-21 DIAGNOSIS — R600 Localized edema: Secondary | ICD-10-CM | POA: Diagnosis not present

## 2017-02-21 DIAGNOSIS — Z79899 Other long term (current) drug therapy: Secondary | ICD-10-CM | POA: Diagnosis not present

## 2017-02-21 DIAGNOSIS — R0602 Shortness of breath: Secondary | ICD-10-CM | POA: Diagnosis not present

## 2017-02-21 DIAGNOSIS — Z794 Long term (current) use of insulin: Secondary | ICD-10-CM | POA: Diagnosis not present

## 2017-02-21 DIAGNOSIS — G309 Alzheimer's disease, unspecified: Secondary | ICD-10-CM | POA: Insufficient documentation

## 2017-02-21 LAB — CBG MONITORING, ED: GLUCOSE-CAPILLARY: 177 mg/dL — AB (ref 65–99)

## 2017-02-21 LAB — TROPONIN I: Troponin I: <0.03 ng/mL (ref <0.03)

## 2017-02-21 LAB — URINALYSIS, ROUTINE W REFLEX MICROSCOPIC
BILIRUBIN URINE: NEGATIVE
Bacteria, UA: NONE SEEN
GLUCOSE, UA: NEGATIVE mg/dL
KETONES UR: NEGATIVE mg/dL
LEUKOCYTES UA: NEGATIVE
NITRITE: NEGATIVE
Protein, ur: NEGATIVE mg/dL
Specific Gravity, Urine: 1.015 (ref 1.005–1.030)
Squamous Epithelial / LPF: NONE SEEN
pH: 5 (ref 5.0–8.0)

## 2017-02-21 LAB — BASIC METABOLIC PANEL
ANION GAP: 3 — AB (ref 5–15)
BUN: 18 mg/dL (ref 6–20)
CALCIUM: 8.3 mg/dL — AB (ref 8.9–10.3)
CO2: 26 mmol/L (ref 22–32)
Chloride: 107 mmol/L (ref 101–111)
Creatinine, Ser: 1.84 mg/dL — ABNORMAL HIGH (ref 0.61–1.24)
GFR calc non Af Amer: 32 mL/min — ABNORMAL LOW (ref >60)
GFR, EST AFRICAN AMERICAN: 37 mL/min — AB (ref >60)
Glucose, Bld: 205 mg/dL — ABNORMAL HIGH (ref 65–99)
Potassium: 4.3 mmol/L (ref 3.5–5.1)
SODIUM: 136 mmol/L (ref 135–145)

## 2017-02-21 LAB — CBC
HCT: 32.4 % — ABNORMAL LOW (ref 39.0–52.0)
HEMOGLOBIN: 10.9 g/dL — AB (ref 13.0–17.0)
MCH: 30.7 pg (ref 26.0–34.0)
MCHC: 33.6 g/dL (ref 30.0–36.0)
MCV: 91.3 fL (ref 78.0–100.0)
PLATELETS: 152 10*3/uL (ref 150–400)
RBC: 3.55 MIL/uL — AB (ref 4.22–5.81)
RDW: 13.8 % (ref 11.5–15.5)
WBC: 4.2 10*3/uL (ref 4.0–10.5)

## 2017-02-21 LAB — BRAIN NATRIURETIC PEPTIDE: B Natriuretic Peptide: 37 pg/mL (ref 0.0–100.0)

## 2017-02-21 NOTE — ED Provider Notes (Signed)
[place] DEPT Provider Note   CSN: [id number] Arrival date & time: 02/21/17  1959     History   Chief Complaint Chief Complaint  Patient presents with  . Near Syncope    HPI Samuel Ryan is a 82 y.o. male.  HPI  Patient was eating supper when he had a 30 to 60 second episode where he kind of slumped over to the side and his eyes were open but he wasn't responding. The patient remembers the whole thing and remembers feeling weak and like he was going to pass out and he could hear people but couldn't really put together what they were saying. Patient slowly returned to normal. EMS brought him here. With them he had some elevated blood pressure, normal glucose, normal vital signs otherwise. No history of the same. Does have a history of type 2 diabetes. Also has a history of DVT and PE. He is not having shortness of breath or chest pain this time.  Past Medical History:  Diagnosis Date  . Acute cholecystitis 12/12/2012 to 12/19/2012   Drained percutaneously  . BPH (benign prostatic hypertrophy)   . Deep venous thrombosis (HCC)    Left leg, diagnosed 7/12  . Dementia    mild /needs supervision for safety per son -POA  . Diverticulitis   . Essential hypertension, benign   . History of pneumonia    prior to 2012  . Incontinence of urine    wears depends  . Mixed hyperlipidemia   . Osteoarthritis   . Pulmonary embolism ([place])    Diagnosed 7/12  . Type 2 diabetes mellitus [facility])     Patient Active Problem List   Diagnosis Date Noted  . Hospital discharge follow-up 11/26/2016  . Bilateral leg edema 11/26/2016  . Hypoalbuminemia 11/23/2016  . Urinary frequency 09/17/2015  . At high risk for falls 03/30/2015  . Generalized osteoarthritis 07/16/2014  . Urinary incontinence 07/16/2014  . Pulmonary interstitial fibrosis ([place]) 07/03/2014  . Abnormal CXR (chest x-ray) 05/27/2014  . Thyroid mass of unclear etiology 05/07/2014  . Vitamin D deficiency 01/31/2014  . Allergic  rhinitis 01/31/2014  . Depression due to dementia 02/25/2013  . Diverticulosis of sigmoid colon 12/13/2012  . Right ventricular dysfunction 07/20/2011  . Heart disease, unspecified 07/20/2011  . Alzheimer's dementia with behavioral disturbance 09/12/2010  . Goiter 06/19/2009  . Essential hypertension 12/11/2008  . Type 2 diabetes, uncontrolled, with renal manifestation ([place]) 09/05/2008  . Hyperlipidemia with target LDL less than 100 07/19/2008  . BENIGN PROSTATIC HYPERTROPHY, HX OF 03/28/2008    Past Surgical History:  Procedure Laterality Date  . CARPAL TUNNEL RELEASE    . COLONOSCOPY  08/31/2012   Procedure: COLONOSCOPY;  Surgeon: Rogene Houston, MD;  Location[facility];  Service: Endoscopy;  Laterality: N/A;  830  . CYSTOSCOPY WITH INSERTION OF UROLIFT N/A 09/02/2016   Procedure: CYSTOSCOPY WITH INSERTION OF UROLIFT;  Surgeon: Cleon Gustin, MD;  Location: [facility];  Service: Urology;  Laterality: N/A;  . EYE SURGERY Bilateral    bilateral cataract  . Right total knee replacement  04/23/2011  . SPINE SURGERY  prior to 2012   Neck fusion  . UMBILICAL HERNIA REPAIR     umbilical       Home Medications    Prior to Admission medications   Medication Sig Start Date End Date Taking? Authorizing Provider  acetaminophen (TYLENOL) 500 MG tablet Take 1,000 mg by mouth every 6 (six) hours as needed for mild  pain, moderate pain or headache.    Yes Historical Provider, MD  albuterol (PROVENTIL HFA;VENTOLIN HFA) 108 (90 Base) MCG/ACT inhaler Two puffs at bedtime , as needed, for excessive cough Patient taking differently: Inhale 1-2 puffs into the lungs every 6 (six) hours as needed for wheezing or shortness of breath.  11/16/16  Yes [name], MD  benazepril (LOTENSIN) 5 MG tablet TAKE 1 TABLET EVERY DAY 01/06/17  Yes [name], MD  Carboxymethylcellul-Glycerin 0.5-0.9 % SOLN Place 1 drop into both eyes as needed (for dry eyes).   Yes Historical  Provider, MD  donepezil (ARICEPT) 10 MG tablet TAKE 1 TABLET AT BEDTIME 01/06/17  Yes [name], MD  ferrous sulfate 325 (65 FE) MG tablet Take 325 mg by mouth daily with breakfast.   Yes Historical Provider, MD  fluticasone (FLONASE) 50 MCG/ACT nasal spray Place 2 sprays into both nostrils daily as needed for rhinitis.   Yes Historical Provider, MD  glipiZIDE (GLUCOTROL XL) 10 MG 24 hr tablet TAKE TWO TABLETS EVERY MORNING AT BREAKFAST  Patient taking differently: TAKE ONE TABLET EVERY MORNING AT BREAKFAST 01/06/17  Yes [name], MD  insulin glargine (LANTUS) 100 unit/mL SOPN Inject 15 Units into the skin every morning.    Yes Historical Provider, MD  loratadine (CLARITIN) 10 MG tablet Take 10 mg by mouth every morning.    Yes Historical Provider, MD  lovastatin (MEVACOR) 40 MG tablet TAKE 1 TABLET AT BEDTIME 01/06/17  Yes [name], MD  mirabegron ER (MYRBETRIQ) 50 MG TB24 tablet Take 50 mg by mouth daily.    Yes Historical Provider, MD  omeprazole (PRILOSEC) 40 MG capsule Take 1 capsule (40 mg total) by mouth daily. 09/20/16  Yes [name], MD  promethazine-dextromethorphan (PROMETHAZINE-DM) 6.25-15 MG/5ML syrup One teaspoon at bedtime, as needed, for excessive cough Patient taking differently: Take 5 mLs by mouth at bedtime as needed for cough. One teaspoon at bedtime, as needed, 11/16/16  Yes [name], MD  sertraline (ZOLOFT) 50 MG tablet TAKE 1 TABLET EVERY DAY 01/06/17  Yes [name], MD  tamsulosin (FLOMAX) 0.4 MG CAPS capsule TAKE 1 CAPSULE EVERY DAY 01/06/17  Yes [name], MD  TRADJENTA 5 MG TABS tablet TAKE 1 TABLET DAILY 02/18/17  Yes [name], MD  traMADol (ULTRAM) 50 MG tablet Take 1 tablet (50 mg total) by mouth every 8 (eight) hours as needed. Maximum daily dose 3 tabs 01/12/17  Yes [name], MD  Vitamin D, Ergocalciferol, (DRISDOL) 50000 units CAPS capsule Take 50,000 Units by mouth every Friday.   Yes  Historical Provider, MD  Insulin Pen Needle (PEN NEEDLES) 31G X 5 MM MISC 1 each by Does not apply route as directed. Use to inject lantus daily dx E11.65 02/08/17   [name], MD    Family History Family History  Problem Relation Age of Onset  . Cancer Mother     Stomach  . Diabetes Sister   . Hypertension Sister   . Diabetes Brother   . Cancer Brother     Gallbladder  . Seizures Son     due to meningitis as a child    Social History Social History  Substance Use Topics  . Smoking status: Never Smoker  . Smokeless tobacco: Never Used  . Alcohol use No     Allergies   Ace inhibitors; Vancomycin; and Zosyn [piperacillin sod-tazobactam so]   Review of Systems Review of Systems  Respiratory: Negative for cough and shortness of breath.   Gastrointestinal: Positive for abdominal pain.  Endocrine: Positive for polyuria.  All other systems reviewed and are negative.    Physical Exam Updated Vital Signs BP (!) 160/90   Pulse 70   Temp 98.5 F (36.9 C) (Oral)   Resp (!) 22   Ht 5\' 10"  (1.778 m)   Wt 230 lb (104.3 kg)   SpO2 95%   BMI 33.00 kg/m   Physical Exam  Constitutional: He is oriented to person, place, and time. He appears well-developed and well-nourished.  HENT:  Head: Normocephalic and atraumatic.  Eyes: Conjunctivae and EOM are normal.  Neck: Normal range of motion.  Cardiovascular: Normal rate.   Pulmonary/Chest: Effort normal. No respiratory distress.  Abdominal: Soft. He exhibits no distension.  Musculoskeletal: Normal range of motion. He exhibits edema (trace pitting in BLE). He exhibits no deformity.  Neurological: He is alert and oriented to person, place, and time. No cranial nerve deficit. Coordination normal.  Skin: Skin is warm and dry.  Nursing note and vitals reviewed.    ED Treatments / Results  Labs (all labs ordered are listed, but only abnormal results are displayed) Labs Reviewed  BASIC METABOLIC PANEL - Abnormal;  Notable for the following:       Result Value   Glucose, Bld 205 (*)    Creatinine, Ser 1.84 (*)    Calcium 8.3 (*)    GFR calc non Af Amer 32 (*)    GFR calc Af Amer 37 (*)    Anion gap 3 (*)    All other components within normal limits  CBC - Abnormal; Notable for the following:    RBC 3.55 (*)    Hemoglobin 10.9 (*)    HCT 32.4 (*)    All other components within normal limits  URINALYSIS, ROUTINE W REFLEX MICROSCOPIC - Abnormal; Notable for the following:    Hgb urine dipstick MODERATE (*)    All other components within normal limits  CBG MONITORING, ED - Abnormal; Notable for the following:    Glucose-Capillary 177 (*)    All other components within normal limits  TROPONIN I  BRAIN NATRIURETIC PEPTIDE    EKG  EKG Interpretation  Date/Time:  Monday February 21 2017 20:15:04 EDT Ventricular Rate:  65 PR Interval:    QRS Duration: 148 QT Interval:  417 QTC Calculation: 434 R Axis:   55 Text Interpretation:  Sinus rhythm Short PR interval Right bundle branch block No significant change since last tracing Confirmed by [facility] MD, Laurita Peron [phone]) on 02/21/2017 8:27:59 PM       Radiology Dg Chest 2 View  Result Date: 02/21/2017 CLINICAL DATA:  Near syncope EXAM: CHEST  2 VIEW COMPARISON:  11/10/2016 FINDINGS: Chronic mild cardiomegaly. Stable aortic and hilar contours. Interstitial opacities at the bases are chronic. Scarring has been seen in the lower lungs since at least 2015 CT. There is no edema, consolidation, effusion, or pneumothorax. IMPRESSION: 1. No acute finding. 2. Chronic cardiomegaly and lung scarring at the bases. Electronically Signed   By: Monte Fantasia M.D.   On: 02/21/2017 21:14   Ct Head Wo Contrast  Result Date: 02/21/2017 CLINICAL DATA:  Near syncope earlier today. EXAM: CT HEAD WITHOUT CONTRAST TECHNIQUE: Contiguous axial images were obtained from the base of the skull through the vertex without intravenous contrast. COMPARISON:  Head CT 12/12/2012  FINDINGS: Brain: Stable generalized cerebral and cerebellar atrophy. Periventricular white matter hypodensity, nonspecific, likely related to chronic  small vessel ischemia, with mild progression from prior. No intracranial hemorrhage, evidence of acute ischemia, mass effect or midline shift. No hydrocephalus. No extra-axial fluid collection. Vascular: Atherosclerosis of skullbase vasculature without hyperdense vessel or abnormal calcification. Skull: Normal. Negative for fracture or focal lesion. Sinuses/Orbits: Chronic defect of the left lamina papyracea with herniation of intraorbital fat. No acute abnormality. Mastoid air cells and paranasal sinuses are clear. Other: None. IMPRESSION: 1.  No acute intracranial abnormality. 2. Stable generalized atrophy. Mild progression of chronic small vessel ischemia from most recent comparison 2014. Electronically Signed   By: Jeb Levering M.D.   On: 02/21/2017 21:28    Procedures Procedures (including critical care time)  Medications Ordered in ED Medications - No data to display   Initial Impression / Assessment and Plan / ED Course  I have reviewed the triage vital signs and the nursing notes.  Pertinent labs & imaging results that were available during my care of the patient were reviewed by me and considered in my medical decision making (see chart for details).    No high risk elements on SF syncope rule. No e/o cva/stroke/HF/arrhythmia in ER. Eating/drinking/conversating ok  Plan for dc with close pcp follow up if urine ok.   Final Clinical Impressions(s) / ED Diagnoses   Final diagnoses:  Near syncope    New Prescriptions Discharge Medication List as of 02/21/2017 10:16 PM       Merrily Pew, MD 02/23/17 [phone]

## 2017-02-21 NOTE — ED Triage Notes (Signed)
Pt c/o near syncopal episode that occurred while out to eat; pt c/o lower abdominal pain and pt has been having urinary frequency today; cbg 175

## 2017-02-23 ENCOUNTER — Ambulatory Visit (INDEPENDENT_AMBULATORY_CARE_PROVIDER_SITE_OTHER): Payer: Commercial Managed Care - HMO | Admitting: Urology

## 2017-02-23 DIAGNOSIS — N401 Enlarged prostate with lower urinary tract symptoms: Secondary | ICD-10-CM | POA: Diagnosis not present

## 2017-02-23 DIAGNOSIS — R3915 Urgency of urination: Secondary | ICD-10-CM

## 2017-02-24 LAB — COMPLETE METABOLIC PANEL WITH GFR
ALBUMIN: 3.4 g/dL — AB (ref 3.6–5.1)
ALK PHOS: 55 U/L (ref 40–115)
ALT: 8 U/L — ABNORMAL LOW (ref 9–46)
AST: 14 U/L (ref 10–35)
BILIRUBIN TOTAL: 0.7 mg/dL (ref 0.2–1.2)
BUN: 13 mg/dL (ref 7–25)
CALCIUM: 8.4 mg/dL — AB (ref 8.6–10.3)
CO2: 22 mmol/L (ref 20–31)
CREATININE: 1.78 mg/dL — AB (ref 0.70–1.11)
Chloride: 107 mmol/L (ref 98–110)
GFR, Est African American: 40 mL/min — ABNORMAL LOW (ref >=60)
GFR, Est Non African American: 34 mL/min — ABNORMAL LOW (ref >=60)
GLUCOSE: 120 mg/dL — AB (ref 65–99)
Potassium: 4.7 mmol/L (ref 3.5–5.3)
SODIUM: 139 mmol/L (ref 135–146)
TOTAL PROTEIN: 6.9 g/dL (ref 6.1–8.1)

## 2017-02-24 LAB — LIPID PANEL
CHOLESTEROL: 141 mg/dL (ref <200)
HDL: 40 mg/dL — ABNORMAL LOW (ref >40)
LDL Cholesterol: 80 mg/dL (ref <100)
Total CHOL/HDL Ratio: 3.5 Ratio (ref <5.0)
Triglycerides: 107 mg/dL (ref <150)
VLDL: 21 mg/dL (ref <30)

## 2017-02-25 LAB — HEMOGLOBIN A1C
HEMOGLOBIN A1C: 7.5 % — AB (ref <5.7)
Mean Plasma Glucose: 169 mg/dL

## 2017-02-25 LAB — MICROALBUMIN / CREATININE URINE RATIO
Creatinine, Urine: 98 mg/dL (ref 20–370)
MICROALB UR: 5 mg/dL
MICROALB/CREAT RATIO: 51 ug/mg{creat} — AB (ref <30)

## 2017-02-25 LAB — VITAMIN D 25 HYDROXY (VIT D DEFICIENCY, FRACTURES): Vit D, 25-Hydroxy: 45 ng/mL (ref 30–100)

## 2017-03-01 ENCOUNTER — Ambulatory Visit (INDEPENDENT_AMBULATORY_CARE_PROVIDER_SITE_OTHER): Payer: Commercial Managed Care - HMO | Admitting: Family Medicine

## 2017-03-01 VITALS — BP 140/80 | HR 77 | Resp 16 | Ht 70.0 in | Wt 274.0 lb

## 2017-03-01 DIAGNOSIS — Z09 Encounter for follow-up examination after completed treatment for conditions other than malignant neoplasm: Secondary | ICD-10-CM | POA: Diagnosis not present

## 2017-03-01 DIAGNOSIS — F0280 Dementia in other diseases classified elsewhere without behavioral disturbance: Secondary | ICD-10-CM

## 2017-03-01 DIAGNOSIS — IMO0002 Reserved for concepts with insufficient information to code with codable children: Secondary | ICD-10-CM

## 2017-03-01 DIAGNOSIS — Z794 Long term (current) use of insulin: Secondary | ICD-10-CM

## 2017-03-01 DIAGNOSIS — I10 Essential (primary) hypertension: Secondary | ICD-10-CM

## 2017-03-01 DIAGNOSIS — E1121 Type 2 diabetes mellitus with diabetic nephropathy: Secondary | ICD-10-CM | POA: Diagnosis not present

## 2017-03-01 DIAGNOSIS — G301 Alzheimer's disease with late onset: Secondary | ICD-10-CM

## 2017-03-01 DIAGNOSIS — E1165 Type 2 diabetes mellitus with hyperglycemia: Secondary | ICD-10-CM

## 2017-03-01 DIAGNOSIS — E0789 Other specified disorders of thyroid: Secondary | ICD-10-CM

## 2017-03-01 DIAGNOSIS — R55 Syncope and collapse: Secondary | ICD-10-CM

## 2017-03-01 DIAGNOSIS — Z9181 History of falling: Secondary | ICD-10-CM | POA: Diagnosis not present

## 2017-03-01 MED ORDER — OMEPRAZOLE 40 MG PO CPDR: 40.0000 mg | DELAYED_RELEASE_CAPSULE | Freq: Every day | ORAL | 1 refills | Status: DC

## 2017-03-01 NOTE — Patient Instructions (Addendum)
Annual exam in 3.5 month, call if you need me sooner  You are being referred to cardiologist for further evaluation of your heart,and neck arteries and your thyroid gland  Blood work is good  Letter is being prepared an Education officer, museum is being asked to call your son for help  HBA1C, chem 7 and EGFR, non fasting in 3.5 month      Fall Prevention in the Home Falls can cause injuries. They can happen to people of all ages. There are many things you can do to make your home safe and to help prevent falls. What can I do on the outside of my home?  Regularly fix the edges of walkways and driveways and fix any cracks.  Remove anything that might make you trip as you walk through a door, such as a raised step or threshold.  Trim any bushes or trees on the path to your home.  Use bright outdoor lighting.  Clear any walking paths of anything that might make someone trip, such as rocks or tools.  Regularly check to see if handrails are loose or broken. Make sure that both sides of any steps have handrails.  Any raised decks and porches should have guardrails on the edges.  Have any leaves, snow, or ice cleared regularly.  Use sand or salt on walking paths during winter.  Clean up any spills in your garage right away. This includes oil or grease spills. What can I do in the bathroom?  Use night lights.  Install grab bars by the toilet and in the tub and shower. Do not use towel bars as grab bars.  Use non-skid mats or decals in the tub or shower.  If you need to sit down in the shower, use a plastic, non-slip stool.  Keep the floor dry. Clean up any water that spills on the floor as soon as it happens.  Remove soap buildup in the tub or shower regularly.  Attach bath mats securely with double-sided non-slip rug tape.  Do not have throw rugs and other things on the floor that can make you trip. What can I do in the bedroom?  Use night lights.  Make sure that you have a  light by your bed that is easy to reach.  Do not use any sheets or blankets that are too big for your bed. They should not hang down onto the floor.  Have a firm chair that has side arms. You can use this for support while you get dressed.  Do not have throw rugs and other things on the floor that can make you trip. What can I do in the kitchen?  Clean up any spills right away.  Avoid walking on wet floors.  Keep items that you use a lot in easy-to-reach places.  If you need to reach something above you, use a strong step stool that has a grab bar.  Keep electrical cords out of the way.  Do not use floor polish or wax that makes floors slippery. If you must use wax, use non-skid floor wax.  Do not have throw rugs and other things on the floor that can make you trip. What can I do with my stairs?  Do not leave any items on the stairs.  Make sure that there are handrails on both sides of the stairs and use them. Fix handrails that are broken or loose. Make sure that handrails are as long as the stairways.  Check any carpeting to make  sure that it is firmly attached to the stairs. Fix any carpet that is loose or worn.  Avoid having throw rugs at the top or bottom of the stairs. If you do have throw rugs, attach them to the floor with carpet tape.  Make sure that you have a light switch at the top of the stairs and the bottom of the stairs. If you do not have them, ask someone to add them for you. What else can I do to help prevent falls?  Wear shoes that:  Do not have high heels.  Have rubber bottoms.  Are comfortable and fit you well.  Are closed at the toe. Do not wear sandals.  If you use a stepladder:  Make sure that it is fully opened. Do not climb a closed stepladder.  Make sure that both sides of the stepladder are locked into place.  Ask someone to hold it for you, if possible.  Clearly mark and make sure that you can see:  Any grab bars or  handrails.  First and last steps.  Where the edge of each step is.  Use tools that help you move around (mobility aids) if they are needed. These include:  Canes.  Walkers.  Scooters.  Crutches.  Turn on the lights when you go into a dark area. Replace any light bulbs as soon as they burn out.  Set up your furniture so you have a clear path. Avoid moving your furniture around.  If any of your floors are uneven, fix them.  If there are any pets around you, be aware of where they are.  Review your medicines with your doctor. Some medicines can make you feel dizzy. This can increase your chance of falling. Ask your doctor what other things that you can do to help prevent falls. This information is not intended to replace advice given to you by your health care provider. Make sure you discuss any questions you have with your health care provider. Document Released: 09/11/2009 Document Revised: 04/22/2016 Document Reviewed: 12/20/2014 Elsevier Interactive Patient Education  2017 [employer].

## 2017-03-07 ENCOUNTER — Encounter: Payer: Self-pay | Admitting: Family Medicine

## 2017-03-07 DIAGNOSIS — R55 Syncope and collapse: Secondary | ICD-10-CM | POA: Insufficient documentation

## 2017-03-07 NOTE — Assessment & Plan Note (Signed)
rept [place] needed, and is ordered

## 2017-03-07 NOTE — Assessment & Plan Note (Signed)
Adequate control, no med change DASH diet and commitment to daily physical activity for a minimum of 30 minutes discussed and encouraged, as a part of hypertension management. The importance of attaining a healthy weight is also discussed.  BP/Weight 03/01/2017 02/21/2017 12/01/2016 11/23/2016 11/10/2016 11/04/2016 82/04/4157  Systolic BP 309 407 680 881 103 159 458  Diastolic BP 80 90 80 78 83 72 61  Wt. (Lbs) 274 230 265.04 263 269 269 -  BMI 39.31 33 38.03 37.74 38.6 38.6 -

## 2017-03-07 NOTE — Assessment & Plan Note (Signed)
Home safety reviewed 

## 2017-03-07 NOTE — Assessment & Plan Note (Signed)
Improved,and controlled  no med change Samuel Ryan is reminded of the importance of commitment to daily physical activity for 30 minutes or more, as able and the need to limit carbohydrate intake to 30 to 60 grams per meal to help with blood sugar control.   The need to take medication as prescribed, test blood sugar as directed, and to call between visits if there is a concern that blood sugar is uncontrolled is also discussed.   Samuel Ryan is reminded of the importance of daily foot exam, annual eye examination, and good blood sugar, blood pressure and cholesterol control.  Diabetic Labs Latest Ref Rng & Units 02/24/2017 02/21/2017 11/24/2016 11/18/2016 11/10/2016  HbA1c <5.7 % 7.5(H) - - 7.8(H) -  Microalbumin Not estab mg/dL 5.0 - 26.9(H) - -  Micro/Creat Ratio <30 mcg/mg creat 51(H) - 22.3 - -  Chol <200 mg/dL 141 - - - -  HDL >40 mg/dL 40(L) - - - -  Calc LDL <100 mg/dL 80 - - - -  Triglycerides <150 mg/dL 107 - - - -  Creatinine 0.70 - 1.11 mg/dL 1.78(H) 1.84(H) - 1.84(H) 1.77(H)   BP/Weight 03/01/2017 02/21/2017 12/01/2016 11/23/2016 11/10/2016 11/04/2016 25/01/6643  Systolic BP 034 742 595 638 756 433 295  Diastolic BP 80 90 80 78 83 72 61  Wt. (Lbs) 274 230 265.04 263 269 269 -  BMI 39.31 33 38.03 37.74 38.6 38.6 -   Foot/eye exam completion dates Latest Ref Rng & Units 09/22/2015 09/22/2015  Eye Exam No Retinopathy No Retinopathy No Retinopathy  Foot Form Completion - - -

## 2017-03-07 NOTE — Assessment & Plan Note (Signed)
Stable on current regime , continue same

## 2017-03-07 NOTE — Progress Notes (Signed)
Samuel Ryan     MRN: [id number]      DOB: 1931-03-08   HPI Samuel Ryan is here for follow up and re-evaluation of chronic medical conditions, medication management and review of any available recent lab and radiology data.  Preventive health is updated, specifically  Cancer screening and Immunization.   Was seen in the ED on 3/26 for near syncope, and will be referred to cardiology as well as for carotid US  The PT denies any adverse reactions to current medications since the last visit.  There are no new concerns.  Son, Ritta Slot requests that  a letter be written on his behalf documenting his involvement in his father's care following the passing of his mother since her passing Jan 10,2016. He indeed has been very attentive, accompanies him to all appointments and states that he actually moved in living with his Father in August , 2016 Denies polyuria, polydipsia, blurred vision , or hypoglycemic episodes.    ROS Denies recent fever or chills. Denies sinus pressure, nasal congestion, ear pain or sore throat. Denies chest congestion, productive cough or wheezing. Denies chest pains, palpitations and leg swelling Denies abdominal pain, nausea, vomiting,diarrhea or constipation.   Denies dysuria, frequency, hesitancy or incontinence. Denies uncontrolled joint pain, does c/o swelling and limitation in mobility requires walker for safe ambulation Denies headaches, seizures, numbness, or tingling. Denies depression, anxiety or insomnia. Denies skin break down or rash.   PE  BP 140/80   Pulse 77   Resp 16   Ht 5\' 10"  (1.778 m)   Wt 274 lb (124.3 kg)   SpO2 90%   BMI 39.31 kg/m   Patient alert and oriented and in no cardiopulmonary distress.  HEENT: No facial asymmetry, EOMI,   oropharynx pink and moist.  Neck decreased ROM no JVD, thyromegaly, no JVD  Chest: Clear to auscultation bilaterally.  CVS: S1, S2 no murmurs, no S3.Regular rate.  ABD: Soft non tender.   Ext: No  edema  MS: decreased ROM spine, shoulders, hips and knees.  Skin: Intact, no ulcerations or rash noted.  Psych: Good eye contact, normal affect. Memory impaired not anxious or depressed appearing.  CNS: CN 2-12 intact, power,  normal throughout.no focal deficits noted.   Assessment & Plan  Encounter for examination following treatment at hospital ED  Visit on 02/21/2017 for near syncope. Has established vascular disease based on co morbidities. Needs carotid doppler and cardiology evaluation Of note, blood sugar was not low at time of evaluation by EMS  Near syncope Near syncopal event in pt with  Vascular disease, refer to cardiology for re evaluation  Type 2 diabetes, uncontrolled, with renal manifestation Improved,and controlled  no med change Samuel Ryan is reminded of the importance of commitment to daily physical activity for 30 minutes or more, as able and the need to limit carbohydrate intake to 30 to 60 grams per meal to help with blood sugar control.   The need to take medication as prescribed, test blood sugar as directed, and to call between visits if there is a concern that blood sugar is uncontrolled is also discussed.   Samuel Ryan is reminded of the importance of daily foot exam, annual eye examination, and good blood sugar, blood pressure and cholesterol control.  Diabetic Labs Latest Ref Rng & Units 02/24/2017 02/21/2017 11/24/2016 11/18/2016 11/10/2016  HbA1c <5.7 % 7.5(H) - - 7.8(H) -  Microalbumin Not estab mg/dL 5.0 - 26.9(H) - -  Micro/Creat Ratio <30 mcg/mg creat  51(H) - 22.3 - -  Chol <200 mg/dL 141 - - - -  HDL >40 mg/dL 40(L) - - - -  Calc LDL <100 mg/dL 80 - - - -  Triglycerides <150 mg/dL 107 - - - -  Creatinine 0.70 - 1.11 mg/dL 1.78(H) 1.84(H) - 1.84(H) 1.77(H)   BP/Weight 03/01/2017 02/21/2017 12/01/2016 11/23/2016 11/10/2016 11/04/2016 60/0/4599  Systolic BP 774 142 395 320 233 435 686  Diastolic BP 80 90 80 78 83 72 61  Wt. (Lbs) 274 230 265.04 263 269  269 -  BMI 39.31 33 38.03 37.74 38.6 38.6 -   Foot/eye exam completion dates Latest Ref Rng & Units 09/22/2015 09/22/2015  Eye Exam No Retinopathy No Retinopathy No Retinopathy  Foot Form Completion - - -        Essential hypertension Adequate control, no med change DASH diet and commitment to daily physical activity for a minimum of 30 minutes discussed and encouraged, as a part of hypertension management. The importance of attaining a healthy weight is also discussed.  BP/Weight 03/01/2017 02/21/2017 12/01/2016 11/23/2016 11/10/2016 11/04/2016 16/06/3728  Systolic BP 021 115 520 802 233 612 244  Diastolic BP 80 90 80 78 83 72 61  Wt. (Lbs) 274 230 265.04 263 269 269 -  BMI 39.31 33 38.03 37.74 38.6 38.6 -       At high risk for falls Home safety reviewed  Thyroid mass of unclear etiology rept [place] needed, and is ordered  Alzheimer's dementia without behavioral disturbance Stable on current regime , continue same  \

## 2017-03-07 NOTE — Assessment & Plan Note (Signed)
ED  Visit on 02/21/2017 for near syncope. Has established vascular disease based on co morbidities. Needs carotid doppler and cardiology evaluation Of note, blood sugar was not low at time of evaluation by EMS

## 2017-03-07 NOTE — Assessment & Plan Note (Signed)
Near syncopal event in pt with  Vascular disease, refer to cardiology for re evaluation

## 2017-03-11 ENCOUNTER — Other Ambulatory Visit: Payer: Self-pay

## 2017-03-11 MED ORDER — LOVASTATIN 40 MG PO TABS: 40.0000 mg | ORAL_TABLET | Freq: Every day | ORAL | 1 refills | Status: DC

## 2017-03-11 MED ORDER — FLUTICASONE PROPIONATE 50 MCG/ACT NA SUSP: 2.0000 | Freq: Every day | NASAL | 1 refills | Status: DC | PRN

## 2017-03-11 MED ORDER — SERTRALINE HCL 50 MG PO TABS: 50.0000 mg | ORAL_TABLET | Freq: Every day | ORAL | 1 refills | Status: DC

## 2017-03-11 MED ORDER — GLIPIZIDE ER 10 MG PO TB24: ORAL_TABLET | ORAL | 1 refills | Status: DC

## 2017-03-11 MED ORDER — DONEPEZIL HCL 10 MG PO TABS: 10.0000 mg | ORAL_TABLET | Freq: Every day | ORAL | 1 refills | Status: DC

## 2017-04-01 ENCOUNTER — Encounter: Payer: Self-pay | Admitting: Student

## 2017-04-01 ENCOUNTER — Ambulatory Visit (INDEPENDENT_AMBULATORY_CARE_PROVIDER_SITE_OTHER): Payer: Commercial Managed Care - HMO | Admitting: Student

## 2017-04-01 VITALS — BP 126/72 | HR 71 | Ht 70.0 in | Wt 272.0 lb

## 2017-04-01 DIAGNOSIS — I10 Essential (primary) hypertension: Secondary | ICD-10-CM

## 2017-04-01 DIAGNOSIS — F0390 Unspecified dementia without behavioral disturbance: Secondary | ICD-10-CM

## 2017-04-01 DIAGNOSIS — R600 Localized edema: Secondary | ICD-10-CM

## 2017-04-01 DIAGNOSIS — N183 Chronic kidney disease, stage 3 unspecified: Secondary | ICD-10-CM

## 2017-04-01 DIAGNOSIS — R55 Syncope and collapse: Secondary | ICD-10-CM

## 2017-04-01 MED ORDER — FUROSEMIDE 20 MG PO TABS: 20.0000 mg | ORAL_TABLET | Freq: Every day | ORAL | 3 refills | Status: DC

## 2017-04-01 NOTE — Patient Instructions (Signed)
Your physician recommends that you schedule a follow-up appointment in: 1 month     START Lasix 20 mg daily    If you need a refill on your cardiac medications before your next appointment, please call your pharmacy.      Thank you for choosing [facility] !

## 2017-04-01 NOTE — Progress Notes (Signed)
Cardiology Office Note    Date:  04/01/2017   ID:  Samuel Ryan, DOB 04/12/1931, MRN [id number]  PCP:  Tula Nakayama, MD  Cardiologist: Dr. Domenic Polite  Chief Complaint  Patient presents with  . Follow-up    History of Present Illness:    Samuel Ryan is a 83 y.o. male with past medical history of HTN, HLD Type 2 DM, Stage 3 CKD, prior PE/DVT (in 2012), known RBBB and dementia who presents to the office today for evaluation of a recent syncopal episode.   He was last examined by Dr. Domenic Polite in 04/2016 for preoperative cardiac clearance in regards to an elective urologic surgery for prostatism. He reported doing well from a cardiac perspective at that time, denying any recent chest pain, dyspnea on exertion, or palpitations. An echocardiogram was obtained, showing a preserved EF of 55-60% with no regional WMA and no significant valve abnormalities. He was therefore cleared for his upcoming procedure.   Since that office visit, he experienced an admission in 10/2016 for PNA. Was examined in the ER on 02/21/2017 for a syncopal episode. Was sitting at the time and slumped over to his side and was not responsive. Labs showed a K+ of 4.3, glucose 205, creatinine 1.84, Hgb 10.9. Troponin and BNP were normal. CT Head showed no acute intracranial abnormalities with stable generalized atrophy.   In talking with the patient today, he does not remember much regarding his recent syncopal event and most of the history surrounding this is provided by his son. Supposedly he was sitting down in a booth and slumped to the side and became unresponsive for several minutes. No seizure-like activity was witnessed. No loss of bowel or bladder incontinence. He denies any associated chest discomfort, palpitations, lightheadedness, dizziness, or dyspnea. He denies any repeat episodes since this.  He has experienced worsening lower extremity edema over the past few months and notes a 10 pound weight gain. His son  reports he is less active at home and develops dyspnea with minimal activity. He denies any associated chest discomfort with this.   Past Medical History:  Diagnosis Date  . Acute cholecystitis 12/12/2012 to 12/19/2012   Drained percutaneously  . BPH (benign prostatic hypertrophy)   . Deep venous thrombosis (HCC)    Left leg, diagnosed 7/12  . Dementia    mild /needs supervision for safety per son -POA  . Diverticulitis   . Essential hypertension, benign   . History of pneumonia    prior to 2012  . Incontinence of urine    wears depends  . Mixed hyperlipidemia   . Osteoarthritis   . Pulmonary embolism ([place])    Diagnosed 7/12  . Type 2 diabetes mellitus ([place])     Past Surgical History:  Procedure Laterality Date  . CARPAL TUNNEL RELEASE    . COLONOSCOPY  08/31/2012   Procedure: COLONOSCOPY;  Surgeon: Rogene Houston, MD;  [facility];  Service: Endoscopy;  Laterality: N/A;  830  . CYSTOSCOPY WITH INSERTION OF UROLIFT N/A 09/02/2016   Procedure: CYSTOSCOPY WITH INSERTION OF UROLIFT;  Surgeon: Cleon Gustin, MD;  Location: [facility];  Service: Urology;  Laterality: N/A;  . EYE SURGERY Bilateral    bilateral cataract  . Right total knee replacement  04/23/2011  . SPINE SURGERY  prior to 2012   Neck fusion  . UMBILICAL HERNIA REPAIR     umbilical    Current Medications: Outpatient Medications Prior to Visit  Medication Sig  Dispense Refill  . acetaminophen (TYLENOL) 500 MG tablet Take 1,000 mg by mouth every 6 (six) hours as needed for mild pain, moderate pain or headache.     . albuterol (PROVENTIL HFA;VENTOLIN HFA) 108 (90 Base) MCG/ACT inhaler Two puffs at bedtime , as needed, for excessive cough (Patient taking differently: Inhale 1-2 puffs into the lungs every 6 (six) hours as needed for wheezing or shortness of breath. ) 18 g 0  . benazepril (LOTENSIN) 5 MG tablet TAKE 1 TABLET EVERY DAY 90 tablet 1  . Carboxymethylcellul-Glycerin 0.5-0.9 %  SOLN Place 1 drop into both eyes as needed (for dry eyes).    [name] donepezil (ARICEPT) 10 MG tablet Take 1 tablet (10 mg total) by mouth at bedtime. 90 tablet 1  . ferrous sulfate 325 (65 FE) MG tablet Take 325 mg by mouth daily with breakfast.    . fluticasone (FLONASE) 50 MCG/ACT nasal spray Place 2 sprays into both nostrils daily as needed for rhinitis. 48 g 1  . glipiZIDE (GLUCOTROL XL) 10 MG 24 hr tablet TAKE TWO TABLETS EVERY MORNING AT BREAKFAST 180 tablet 1  . insulin glargine (LANTUS) 100 unit/mL SOPN Inject 15 Units into the skin every morning.     . Insulin Pen Needle (PEN NEEDLES) 31G X 5 MM MISC 1 each by Does not apply route as directed. Use to inject lantus daily dx E11.65 100 each 5  . loratadine (CLARITIN) 10 MG tablet Take 10 mg by mouth every morning.     . lovastatin (MEVACOR) 40 MG tablet Take 1 tablet (40 mg total) by mouth at bedtime. 90 tablet 1  . mirabegron ER (MYRBETRIQ) 50 MG TB24 tablet Take 50 mg by mouth daily.     [name] omeprazole (PRILOSEC) 40 MG capsule Take 1 capsule (40 mg total) by mouth daily. 90 capsule 1  . sertraline (ZOLOFT) 50 MG tablet Take 1 tablet (50 mg total) by mouth daily. 90 tablet 1  . tamsulosin (FLOMAX) 0.4 MG CAPS capsule TAKE 1 CAPSULE EVERY DAY 90 capsule 1  . TRADJENTA 5 MG TABS tablet TAKE 1 TABLET DAILY 90 tablet 1  . traMADol (ULTRAM) 50 MG tablet Take 1 tablet (50 mg total) by mouth every 8 (eight) hours as needed. Maximum daily dose 3 tabs 60 tablet 1  . Vitamin D, Ergocalciferol, (DRISDOL) 50000 units CAPS capsule Take 50,000 Units by mouth every Friday.     No facility-administered medications prior to visit.      Allergies:   Ace inhibitors; Vancomycin; and Zosyn [piperacillin sod-tazobactam so]   Social History   Social History  . Marital status: Widowed    Spouse name: N/A  . Number of children: N/A  . Years of education: N/A   Social History Main Topics  . Smoking status: Never Smoker  . Smokeless tobacco: Never Used  .  Alcohol use No  . Drug use: No  . Sexual activity: No   Other Topics Concern  . None   Social History Narrative  . None     Family History:  The patient's family history includes Cancer in his brother and mother; Diabetes in his brother and sister; Hypertension in his sister; Seizures in his son.   Review of Systems:   Please see the history of present illness.     General:  No chills, fever, night sweats or weight changes.  Cardiovascular:  No chest pain, dyspnea on exertion, orthopnea, palpitations, paroxysmal nocturnal dyspnea. Positive for edema.  Dermatological: No rash, lesions/masses  Respiratory: No cough, dyspnea Urologic: No hematuria, dysuria Abdominal:   No nausea, vomiting, diarrhea, bright red blood per rectum, melena, or hematemesis Neurologic:  No visual changes, wkns, changes in mental status. Positive for syncope.  All other systems reviewed and are otherwise negative except as noted above.   Physical Exam:    VS:  BP 126/72   Pulse 71   Ht 5\' 10"  (1.778 m)   Wt 272 lb (123.4 kg)   SpO2 91%   BMI 39.03 kg/m    General: Well developed, elderly African American male appearing in no acute distress. Head: Normocephalic, atraumatic, sclera non-icteric, no xanthomas, nares are without discharge.  Neck: Right carotid bruit noted. JVD not elevated.  Lungs: Respirations regular and unlabored, without wheezes or rales.  Heart: Regular rate and rhythm. No S3 or S4.  No murmur, no rubs, or gallops appreciated. Abdomen: Soft, non-tender, non-distended with normoactive bowel sounds. No hepatomegaly. No rebound/guarding. No obvious abdominal masses. Msk:  Strength and tone appear normal for age. No joint deformities or effusions. Extremities: No clubbing or cyanosis. 2+ pitting edema up to knees bilaterally. Distal pedal pulses are 2+ bilaterally. Neuro: Alert and oriented X 3. Moves all extremities spontaneously. No focal deficits noted. Psych:  Responds to questions  appropriately with a normal affect. Skin: No rashes or lesions noted  Wt Readings from Last 3 Encounters:  04/01/17 272 lb (123.4 kg)  03/01/17 274 lb (124.3 kg)  02/21/17 230 lb (104.3 kg)     Studies/Labs Reviewed:   EKG:  EKG is not ordered today. EKG from 3/26 reviewed which shows NSR, HR 65, with known RBBB.   Recent Labs: 11/18/2016: TSH 0.69 02/21/2017: B Natriuretic Peptide 37.0; Hemoglobin 10.9; Platelets 152 02/24/2017: ALT 8; BUN 13; Creat 1.78; Potassium 4.7; Sodium 139   Lipid Panel    Component Value Date/Time   CHOL 141 02/24/2017 0834   TRIG 107 02/24/2017 0834   HDL 40 (L) 02/24/2017 0834   CHOLHDL 3.5 02/24/2017 0834   VLDL 21 02/24/2017 0834   LDLCALC 80 02/24/2017 0834    Additional studies/ records that were reviewed today include:   Echocardiogram: 04/2016 Study Conclusions  - Left ventricle: The cavity size was normal. Wall thickness was   increased in a pattern of mild LVH. Systolic function was normal.   The estimated ejection fraction was in the range of 55% to 60%.   Images were inadequate for LV wall motion assessment. However, no   gross regional variation on apical 4-chamber views. There was an   increased relative contribution of atrial contraction to   ventricular filling. - Aortic valve: Mildly thickened leaflets. There was no stenosis. - Systemic veins: IVC dilated with normal respiratory variation.   Estimated CVP 8 mmHg.   Assessment:    1. Lower extremity edema   2. Syncope, unspecified syncope type   3. Essential hypertension   4. CKD (chronic kidney disease) stage 3, GFR 30-59 ml/min   5. Dementia without behavioral disturbance, unspecified dementia type      Plan:   In order of problems listed above:  1. Lower Extremity Edema - he reports worsening lower extremity edema and dyspnea on exertion for the past few months. Weight has also increased by 10 lbs.  - he has 2+ pitting edema along his lower extremities on  examination, but thankfully lungs are clear. - Recent echo in 04/2016 showed a preserved EF of 55-60% with no regional WMA and no significant valve abnormalities.  -  He does report eating out for supper almost every night. He was advised to limit his sodium and fluid intake. - With his significant edema, we will start Lasix 20 mg daily. May need to further titrate pending his response, but prefer a lower dose at this time with his stage 3 CKD and upcoming urologic procedure. Also, he is hesitant about being on a diuretic with his already known urinary incontinence. Repeat BMET in 2 weeks to assess creatinine and K+.   2. Syncope - lost consciousness while sitting in a restaurant. No precipitating symptoms. No seizure-like activity witnessed or bowel/bladder incontinence.  - ER evaluation showed K+ of 4.3, glucose 205, creatinine 1.84, Hgb 10.9. Troponin and BNP were normal. CT Head showed no acute intracranial abnormalities with stable generalized atrophy.  - carotid dopplers have been ordered by his PCP. Will make sure these are arranged. Recent echo in 2017 showed a preserved EF with no significant valve abnormalities. Will not repeat his echo at this time.   3. Essential HTN - BP well-controlled at 126/72 during today's visit. - continue Benazepril 5mg  daily.    4. Stage 3 CKD - baseline creatinine 1.7 - 1.8. - will need repeat BMET in 2 weeks with initiation of Lasix.   5. Dementia - A&Ox3 during today's visit but most history is obtained from the patient's son. - remains on Aricept.    Medication Adjustments/Labs and Tests Ordered: Current medicines are reviewed at length with the patient today.  Concerns regarding medicines are outlined above.  Medication changes, Labs and Tests ordered today are listed in the Patient Instructions below. Patient Instructions  Your physician recommends that you schedule a follow-up appointment in: 1 month  START Lasix 20 mg daily  If you need a  refill on your cardiac medications before your next appointment, please call your pharmacy.  Thank you for choosing [facility] !  Signed, Erma Heritage, PA-C  04/01/2017 5:38 PM    [facility] [place], [place], [place]  [address] Phone: [phone]; Fax: [phone]  [address] [place], [place] [address] Phone: [phone]

## 2017-04-08 ENCOUNTER — Other Ambulatory Visit: Payer: Self-pay | Admitting: Family Medicine

## 2017-05-06 ENCOUNTER — Encounter: Payer: Self-pay | Admitting: Cardiology

## 2017-05-06 ENCOUNTER — Ambulatory Visit (INDEPENDENT_AMBULATORY_CARE_PROVIDER_SITE_OTHER): Payer: Medicare PPO | Admitting: Cardiology

## 2017-05-06 VITALS — BP 137/77 | HR 75 | Ht 70.0 in | Wt 270.0 lb

## 2017-05-06 DIAGNOSIS — R600 Localized edema: Secondary | ICD-10-CM

## 2017-05-06 DIAGNOSIS — Z87898 Personal history of other specified conditions: Secondary | ICD-10-CM | POA: Diagnosis not present

## 2017-05-06 DIAGNOSIS — F0390 Unspecified dementia without behavioral disturbance: Secondary | ICD-10-CM

## 2017-05-06 DIAGNOSIS — N183 Chronic kidney disease, stage 3 unspecified: Secondary | ICD-10-CM

## 2017-05-06 NOTE — Patient Instructions (Signed)
Your physician wants you to follow-up in: 6 months Dr Ferne Reus will receive a reminder letter in the mail two months in advance. If you don't receive a letter, please call our office to schedule the follow-up appointment.   Your physician recommends that you continue on your current medications as directed. Please refer to the Current Medication list given to you today.   If you need a refill on your cardiac medications before your next appointment, please call your pharmacy.   No testing or lab work today     Thank you for choosing [facility] !

## 2017-05-06 NOTE — Progress Notes (Signed)
Cardiology Office Note  Date: 05/06/2017   ID: [name], DOB 02-27-1931, MRN [id number]  PCP: [name], MD  Primary Cardiologist: Rozann Lesches, MD   Chief Complaint  Patient presents with  . Cardiac follow-up    History of Present Illness: Samuel Ryan is an 83 y.o. male seen recently by Ms. Strader PA-C in early May. He was assessed at that time following an episode of syncope that occurred back in March without obvious etiology. He has also been experiencing leg swelling and a 10 pound weight gain. Lasix was initiated. He is here today with his son for follow-up visit. He reports that his leg swelling has been slowly getting better. It does not look like his weight has changed much at all based on home measurements, but he is down by about 4 pounds since April by our scales. He has a lot of trouble with urinary incontinence, wears a protective pad, and follows with a urologist. He is undergoing medication adjustments to address this. No report of any further syncopal episodes since March. No palpitations.  I reviewed his medications which are outlined below. We discussed continuing low-dose Lasix without escalation so as not to overly exacerbate urinary frequency.  Last lab work was in March at which point creatinine was 1.78 and relatively stable. Patient's son states that visit is pending soon with Dr. Moshe Cipro for repeat lab work.  Past Medical History:  Diagnosis Date  . Acute cholecystitis 11/2012   Drained percutaneously  . BPH (benign prostatic hypertrophy)   . Deep venous thrombosis (HCC)    Left leg, diagnosed 7/12  . Dementia    Needs supervision for safety per son - POA  . Diverticulitis   . Essential hypertension, benign   . History of pneumonia    Prior to 2012  . Incontinence of urine   . Mixed hyperlipidemia   . Osteoarthritis   . Pulmonary embolism ([place])    Diagnosed 7/12  . Type 2 diabetes mellitus ([place])     Past Surgical History:    Procedure Laterality Date  . CARPAL TUNNEL RELEASE    . COLONOSCOPY  08/31/2012   Procedure: COLONOSCOPY;  Surgeon: Rogene Houston, MD;  [facility];  Service: Endoscopy;  Laterality: N/A;  830  . CYSTOSCOPY WITH INSERTION OF UROLIFT N/A 09/02/2016   Procedure: CYSTOSCOPY WITH INSERTION OF UROLIFT;  Surgeon: Cleon Gustin, MD;  Location: [facility];  Service: Urology;  Laterality: N/A;  . EYE SURGERY Bilateral    bilateral cataract  . Right total knee replacement  04/23/2011  . SPINE SURGERY  prior to 2012   Neck fusion  . UMBILICAL HERNIA REPAIR     umbilical    Current Outpatient Prescriptions  Medication Sig Dispense Refill  . acetaminophen (TYLENOL) 500 MG tablet Take 1,000 mg by mouth every 6 (six) hours as needed for mild pain, moderate pain or headache.     . albuterol (PROVENTIL HFA;VENTOLIN HFA) 108 (90 Base) MCG/ACT inhaler Two puffs at bedtime , as needed, for excessive cough (Patient taking differently: Inhale 1-2 puffs into the lungs every 6 (six) hours as needed for wheezing or shortness of breath. ) 18 g 0  . benazepril (LOTENSIN) 5 MG tablet TAKE 1 TABLET EVERY DAY 90 tablet 1  . Carboxymethylcellul-Glycerin 0.5-0.9 % SOLN Place 1 drop into both eyes as needed (for dry eyes).    [name] donepezil (ARICEPT) 10 MG tablet Take 1 tablet (10 mg  total) by mouth at bedtime. 90 tablet 1  . ferrous sulfate 325 (65 FE) MG tablet Take 325 mg by mouth daily with breakfast.    . fesoterodine (TOVIAZ) 4 MG TB24 tablet Take 4 mg by mouth daily.    . fluticasone (FLONASE) 50 MCG/ACT nasal spray Place 2 sprays into both nostrils daily as needed for rhinitis. 48 g 1  . furosemide (LASIX) 20 MG tablet Take 1 tablet (20 mg total) by mouth daily. 90 tablet 3  . glipiZIDE (GLUCOTROL XL) 10 MG 24 hr tablet TAKE TWO TABLETS EVERY MORNING AT BREAKFAST 180 tablet 1  . insulin glargine (LANTUS) 100 unit/mL SOPN Inject 15 Units into the skin every morning.     . Insulin Pen  Needle (PEN NEEDLES) 31G X 5 MM MISC 1 each by Does not apply route as directed. Use to inject lantus daily dx E11.65 100 each 5  . loratadine (CLARITIN) 10 MG tablet Take 10 mg by mouth every morning.     . lovastatin (MEVACOR) 40 MG tablet Take 1 tablet (40 mg total) by mouth at bedtime. 90 tablet 1  . omeprazole (PRILOSEC) 40 MG capsule Take 1 capsule (40 mg total) by mouth daily. 90 capsule 1  . sertraline (ZOLOFT) 50 MG tablet Take 1 tablet (50 mg total) by mouth daily. 90 tablet 1  . tamsulosin (FLOMAX) 0.4 MG CAPS capsule TAKE 1 CAPSULE EVERY DAY 90 capsule 1  . TRADJENTA 5 MG TABS tablet TAKE 1 TABLET DAILY 90 tablet 1  . traMADol (ULTRAM) 50 MG tablet TAKE  (1)  TABLET  EVERY EIGHT HOURS AS NEEDED. 60 tablet 2  . Vitamin D, Ergocalciferol, (DRISDOL) 50000 units CAPS capsule Take 50,000 Units by mouth every Friday.     No current facility-administered medications for this visit.    Allergies:  Ace inhibitors; Vancomycin; and Zosyn [piperacillin sod-tazobactam so]   Social History: The patient  reports that he has never smoked. He has never used smokeless tobacco. He reports that he does not drink alcohol or use drugs.   ROS:  Please see the history of present illness. Otherwise, complete review of systems is positive for memory trouble.  All other systems are reviewed and negative.   Physical Exam: VS:  BP 137/77 (BP Location: Left Arm)   Pulse 75   Ht 5\' 10"  (1.778 m)   Wt 270 lb (122.5 kg)   SpO2 (!) 89%   BMI 38.74 kg/m , BMI Body mass index is 38.74 kg/m.  Wt Readings from Last 3 Encounters:  05/06/17 270 lb (122.5 kg)  04/01/17 272 lb (123.4 kg)  03/01/17 274 lb (124.3 kg)    General: Obese elderly male seated in a wheelchair, appears comfortable at rest. HEENT: Conjunctiva and lids normal, oropharynx clear. Neck: Supple, no elevated JVP or carotid bruits, no thyromegaly. Lungs: Clear to auscultation, nonlabored breathing at rest. Cardiac: Regular rate and rhythm, no  S3, soft systolic murmur, no pericardial rub. Abdomen: Soft, nontender, bowel sounds present, no guarding or rebound. Extremities: Chronic appearing bilateral lower leg edema, distal pulses 1-2+. Skin: Warm and dry. Musculoskeletal: No kyphosis. Neuropsychiatric: Alert and oriented x3, affect grossly appropriate.  ECG: I personally reviewed the tracing from 02/22/2017 showed sinus rhythm with right bundle-branch block.  Recent Labwork: 11/18/2016: TSH 0.69 02/21/2017: B Natriuretic Peptide 37.0; Hemoglobin 10.9; Platelets 152 02/24/2017: ALT 8; AST 14; BUN 13; Creat 1.78; Potassium 4.7; Sodium 139     Component Value Date/Time   CHOL 141 02/24/2017 0834  TRIG 107 02/24/2017 0834   HDL 40 (L) 02/24/2017 0834   CHOLHDL 3.5 02/24/2017 0834   VLDL 21 02/24/2017 0834   LDLCALC 80 02/24/2017 0834    Other Studies Reviewed Today:  Echocardiogram 05/07/2016: Study Conclusions  - Left ventricle: The cavity size was normal. Wall thickness was   increased in a pattern of mild LVH. Systolic function was normal.   The estimated ejection fraction was in the range of 55% to 60%.   Images were inadequate for LV wall motion assessment. However, no   gross regional variation on apical 4-chamber views. There was an   increased relative contribution of atrial contraction to   ventricular filling. - Aortic valve: Mildly thickened leaflets. There was no stenosis. - Systemic veins: IVC dilated with normal respiratory variation.   Estimated CVP 8 mmHg.  Assessment and Plan:  1. Bilateral leg edema, gradually improving on low-dose Lasix. Weight has changed very little. Reluctant to push diuretics higher in light of urinary incontinence and frequency. LVEF 55-60% by echocardiogram last year. Continue with same plan and follow-up BMET with Dr. Moshe Cipro.  2. Episode of syncope back in March, no recurrences. Etiology not certain. Continue observation.  3. CKD stage 3, last creatinine 1.7.  4. Dementia,  on Aricept. Patient's son involved in his care.  Current medicines were reviewed with the patient today.  Disposition: Follow-up in 6 months, sooner if needed.  Signed, Satira Sark, MD, [facility] 05/06/2017 2:12 PM    [facility] [address] [place], [place] [address] Phone: [phone]; Fax: [phone]

## 2017-05-07 ENCOUNTER — Other Ambulatory Visit: Payer: Self-pay | Admitting: Family Medicine

## 2017-05-25 ENCOUNTER — Ambulatory Visit (INDEPENDENT_AMBULATORY_CARE_PROVIDER_SITE_OTHER): Payer: Medicare PPO | Admitting: Urology

## 2017-05-25 DIAGNOSIS — R3915 Urgency of urination: Secondary | ICD-10-CM | POA: Diagnosis not present

## 2017-05-25 DIAGNOSIS — N401 Enlarged prostate with lower urinary tract symptoms: Secondary | ICD-10-CM

## 2017-06-15 LAB — COMPLETE METABOLIC PANEL WITH GFR
ALT: 8 U/L — AB (ref 9–46)
AST: 14 U/L (ref 10–35)
Albumin: 3.4 g/dL — ABNORMAL LOW (ref 3.6–5.1)
Alkaline Phosphatase: 59 U/L (ref 40–115)
BILIRUBIN TOTAL: 0.8 mg/dL (ref 0.2–1.2)
BUN: 20 mg/dL (ref 7–25)
CALCIUM: 8.6 mg/dL (ref 8.6–10.3)
CO2: 29 mmol/L (ref 20–31)
CREATININE: 1.85 mg/dL — AB (ref 0.70–1.11)
Chloride: 103 mmol/L (ref 98–110)
GFR, EST AFRICAN AMERICAN: 38 mL/min — AB (ref >=60)
GFR, EST NON AFRICAN AMERICAN: 33 mL/min — AB (ref >=60)
Glucose, Bld: 132 mg/dL — ABNORMAL HIGH (ref 65–99)
Potassium: 4.6 mmol/L (ref 3.5–5.3)
Sodium: 139 mmol/L (ref 135–146)
TOTAL PROTEIN: 7.1 g/dL (ref 6.1–8.1)

## 2017-06-16 LAB — HEMOGLOBIN A1C
HEMOGLOBIN A1C: 7.9 % — AB (ref <5.7)
MEAN PLASMA GLUCOSE: 180 mg/dL

## 2017-06-21 ENCOUNTER — Ambulatory Visit (INDEPENDENT_AMBULATORY_CARE_PROVIDER_SITE_OTHER): Payer: Medicare PPO | Admitting: Family Medicine

## 2017-06-21 ENCOUNTER — Encounter: Payer: Self-pay | Admitting: Family Medicine

## 2017-06-21 ENCOUNTER — Ambulatory Visit: Payer: Self-pay | Admitting: Family Medicine

## 2017-06-21 VITALS — BP 138/80 | HR 90 | Temp 98.6°F | Resp 17 | Ht 70.0 in | Wt 264.0 lb

## 2017-06-21 DIAGNOSIS — Z9181 History of falling: Secondary | ICD-10-CM

## 2017-06-21 DIAGNOSIS — F0280 Dementia in other diseases classified elsewhere without behavioral disturbance: Secondary | ICD-10-CM | POA: Diagnosis not present

## 2017-06-21 DIAGNOSIS — I10 Essential (primary) hypertension: Secondary | ICD-10-CM | POA: Diagnosis not present

## 2017-06-21 DIAGNOSIS — N39498 Other specified urinary incontinence: Secondary | ICD-10-CM | POA: Diagnosis not present

## 2017-06-21 DIAGNOSIS — J3089 Other allergic rhinitis: Secondary | ICD-10-CM | POA: Diagnosis not present

## 2017-06-21 DIAGNOSIS — F0393 Unspecified dementia, unspecified severity, with mood disturbance: Secondary | ICD-10-CM

## 2017-06-21 DIAGNOSIS — G308 Other Alzheimer's disease: Secondary | ICD-10-CM | POA: Diagnosis not present

## 2017-06-21 DIAGNOSIS — E1121 Type 2 diabetes mellitus with diabetic nephropathy: Secondary | ICD-10-CM

## 2017-06-21 DIAGNOSIS — F329 Major depressive disorder, single episode, unspecified: Secondary | ICD-10-CM

## 2017-06-21 DIAGNOSIS — E1165 Type 2 diabetes mellitus with hyperglycemia: Secondary | ICD-10-CM

## 2017-06-21 DIAGNOSIS — E785 Hyperlipidemia, unspecified: Secondary | ICD-10-CM

## 2017-06-21 DIAGNOSIS — E559 Vitamin D deficiency, unspecified: Secondary | ICD-10-CM

## 2017-06-21 DIAGNOSIS — IMO0002 Reserved for concepts with insufficient information to code with codable children: Secondary | ICD-10-CM

## 2017-06-21 DIAGNOSIS — Z794 Long term (current) use of insulin: Secondary | ICD-10-CM

## 2017-06-21 NOTE — Patient Instructions (Addendum)
Annual physical in 3.5 months, call if you need me before  Increase lantus to 20 units daily, drink only water!!!    Fasting lipid, cmp and EGFR, HBa1C, vit D 1 month before follow up   .Thank you  for choosing [facility]. We consider it a privelige to serve you.  Delivering excellent health care in a caring and  compassionate way is our goal.  Partnering with you,  so that together we can achieve this goal is our strategy.

## 2017-06-23 ENCOUNTER — Ambulatory Visit: Payer: Self-pay | Admitting: Family Medicine

## 2017-06-23 ENCOUNTER — Telehealth: Payer: Self-pay | Admitting: Family Medicine

## 2017-06-23 NOTE — Telephone Encounter (Signed)
Patient's son called and left message on nurse line. He states when they were seen a couple days ago, the patient's blood usgar was discussed since the new medication was making it go up higher, so his insulin was increased, but now his cbg's are higher now too.

## 2017-06-24 NOTE — Telephone Encounter (Signed)
I spoke directly  With pt, states thar t he will give the increased medication dose time to take effect, and that he will work on his diet , I advised call back I 1 to 2 weeks if blood sugar still high

## 2017-06-26 ENCOUNTER — Encounter: Payer: Self-pay | Admitting: Family Medicine

## 2017-06-26 NOTE — Assessment & Plan Note (Signed)
Home safety reviewed and fall risk reduction discussed and reviewed at the visit

## 2017-06-26 NOTE — Assessment & Plan Note (Signed)
Improved on new medication recently started by urology per report of his son

## 2017-06-26 NOTE — Assessment & Plan Note (Signed)
Deteriorated increase dose of insulin and closer monitor of diet Samuel Ryan is reminded of the importance of commitment to daily physical activity for 30 minutes or more, as able and the need to limit carbohydrate intake to 30 to 60 grams per meal to help with blood sugar control.   The need to take medication as prescribed, test blood sugar as directed, and to call between visits if there is a concern that blood sugar is uncontrolled is also discussed.   Samuel Ryan is reminded of the importance of daily foot exam, annual eye examination, and good blood sugar, blood pressure and cholesterol control.  Diabetic Labs Latest Ref Rng & Units 06/15/2017 02/24/2017 02/21/2017 11/24/2016 11/18/2016  HbA1c <5.7 % 7.9(H) 7.5(H) - - 7.8(H)  Microalbumin Not estab mg/dL - 5.0 - 26.9(H) -  Micro/Creat Ratio <30 mcg/mg creat - 51(H) - 22.3 -  Chol <200 mg/dL - 141 - - -  HDL >40 mg/dL - 40(L) - - -  Calc LDL <100 mg/dL - 80 - - -  Triglycerides <150 mg/dL - 107 - - -  Creatinine 0.70 - 1.11 mg/dL 1.85(H) 1.78(H) 1.84(H) - 1.84(H)   BP/Weight 06/21/2017 05/06/2017 04/01/2017 03/01/2017 02/21/2017 12/01/2016 59/74/7185  Systolic BP 501 586 825 749 355 217 471  Diastolic BP 80 77 72 80 90 80 78  Wt. (Lbs) 264 270 272 274 230 265.04 263  BMI 37.88 38.74 39.03 39.31 33 38.03 37.74   Foot/eye exam completion dates Latest Ref Rng & Units 09/22/2015 09/22/2015  Eye Exam No Retinopathy No Retinopathy No Retinopathy  Foot Form Completion - - -

## 2017-06-26 NOTE — Assessment & Plan Note (Signed)
Hyperlipidemia:Low fat diet discussed and encouraged.   Lipid Panel  Lab Results  Component Value Date   CHOL 141 02/24/2017   HDL 40 (L) 02/24/2017   LDLCALC 80 02/24/2017   TRIG 107 02/24/2017   CHOLHDL 3.5 02/24/2017   Controlled, no change in medication Needs to commit to more regular physical activity

## 2017-06-26 NOTE — Assessment & Plan Note (Signed)
Controlled, no change in medication  

## 2017-06-26 NOTE — Assessment & Plan Note (Signed)
Controlled, no change in medication DASH diet and commitment to daily physical activity for a minimum of 30 minutes discussed and encouraged, as a part of hypertension management. The importance of attaining a healthy weight is also discussed.  BP/Weight 06/21/2017 05/06/2017 04/01/2017 03/01/2017 02/21/2017 12/01/2016 67/28/9791  Systolic BP 504 136 438 [phone] 648  Diastolic BP 80 77 72 80 90 80 78  Wt. (Lbs) 264 270 272 274 230 265.04 263  BMI 37.88 38.74 39.03 39.31 33 38.03 37.74

## 2017-06-26 NOTE — Progress Notes (Signed)
Samuel Ryan     MRN: [id number]      DOB: 21-May-1931   HPI Samuel Ryan is here for follow up and re-evaluation of chronic medical conditions, medication management and review of any available recent lab and radiology data.  Preventive health is updated, specifically  Cancer screening and Immunization.   Questions or concerns regarding consultations or procedures which the PT has had in the interim are  addressed. The PT denies any adverse reactions to current medications since the last visit. However , new medication he has recently been started on for urinary symptoms, though effective , has been raising his blood sugar   ROS Denies recent fever or chills. Denies sinus pressure, nasal congestion, ear pain or sore throat. Denies chest congestion, productive cough or wheezing. Denies chest pains, palpitations and leg swelling Denies abdominal pain, nausea, vomiting,diarrhea or constipation.   Denies dysuria, frequency, hesitancy or incontinence. Denies headaches, seizures, numbness, or tingling. C/o joint pain and stiffness Denies depression, anxiety or insomnia. Denies skin break down or rash.   PE  BP 138/80 (BP Location: Left Arm, Patient Position: Sitting, Cuff Size: Normal)   Pulse 90   Temp 98.6 F (37 C) (Other (Comment))   Resp 17   Ht 5\' 10"  (1.778 m)   Wt 264 lb (119.7 kg)   SpO2 94%   BMI 37.88 kg/m   Patient alert and oriented and in no cardiopulmonary distress.  HEENT: No facial asymmetry, EOMI,   oropharynx pink and moist.  Neck supple no JVD, no mass.  Chest: Clear to auscultation bilaterally.  CVS: S1, S2 no murmurs, no S3.Regular rate.  ABD: Soft non tender.   Ext: No edema  MS: decreased ROM spine, shoulders, hips and knees.  Skin: Intact, no ulcerations or rash noted.  Psych: Good eye contact, normal affect. Memory intact not anxious or depressed appearing.  CNS: CN 2-12 intact, power,  normal throughout.no focal deficits  noted.   Assessment & Plan  Essential hypertension Controlled, no change in medication DASH diet and commitment to daily physical activity for a minimum of 30 minutes discussed and encouraged, as a part of hypertension management. The importance of attaining a healthy weight is also discussed.  BP/Weight 06/21/2017 05/06/2017 04/01/2017 03/01/2017 02/21/2017 12/01/2016 45/80/9983  Systolic BP 382 505 397 673 419 379 024  Diastolic BP 80 77 72 80 90 80 78  Wt. (Lbs) 264 270 272 274 230 265.04 263  BMI 37.88 38.74 39.03 39.31 33 38.03 37.74       At high risk for falls Home safety reviewed and fall risk reduction discussed and reviewed at the visit  Depression due to dementia Controlled, no change in medication   Type 2 diabetes, uncontrolled, with renal manifestation Deteriorated increase dose of insulin and closer monitor of diet Samuel Ryan is reminded of the importance of commitment to daily physical activity for 30 minutes or more, as able and the need to limit carbohydrate intake to 30 to 60 grams per meal to help with blood sugar control.   The need to take medication as prescribed, test blood sugar as directed, and to call between visits if there is a concern that blood sugar is uncontrolled is also discussed.   Samuel Ryan is reminded of the importance of daily foot exam, annual eye examination, and good blood sugar, blood pressure and cholesterol control.  Diabetic Labs Latest Ref Rng & Units 06/15/2017 02/24/2017 02/21/2017 11/24/2016 11/18/2016  HbA1c <5.7 % 7.9(H) 7.5(H) - -  7.8(H)  Microalbumin Not estab mg/dL - 5.0 - 26.9(H) -  Micro/Creat Ratio <30 mcg/mg creat - 51(H) - 22.3 -  Chol <200 mg/dL - 141 - - -  HDL >40 mg/dL - 40(L) - - -  Calc LDL <100 mg/dL - 80 - - -  Triglycerides <150 mg/dL - 107 - - -  Creatinine 0.70 - 1.11 mg/dL 1.85(H) 1.78(H) 1.84(H) - 1.84(H)   BP/Weight 06/21/2017 05/06/2017 04/01/2017 03/01/2017 02/21/2017 12/01/2016 37/36/6815  Systolic BP 947 076 151 834  373 578 978  Diastolic BP 80 77 72 80 90 80 78  Wt. (Lbs) 264 270 272 274 230 265.04 263  BMI 37.88 38.74 39.03 39.31 33 38.03 37.74   Foot/eye exam completion dates Latest Ref Rng & Units 09/22/2015 09/22/2015  Eye Exam No Retinopathy No Retinopathy No Retinopathy  Foot Form Completion - - -        Hyperlipidemia with target LDL less than 100 Hyperlipidemia:Low fat diet discussed and encouraged.   Lipid Panel  Lab Results  Component Value Date   CHOL 141 02/24/2017   HDL 40 (L) 02/24/2017   LDLCALC 80 02/24/2017   TRIG 107 02/24/2017   CHOLHDL 3.5 02/24/2017   Controlled, no change in medication Needs to commit to more regular physical activity   Urinary incontinence Improved on new medication recently started by urology per report of his son  Alzheimer's dementia without behavioral disturbance Controlled, no change in medication   Allergic rhinitis Controlled, no change in medication

## 2017-06-30 ENCOUNTER — Other Ambulatory Visit: Payer: Self-pay | Admitting: Family Medicine

## 2017-07-06 ENCOUNTER — Ambulatory Visit (INDEPENDENT_AMBULATORY_CARE_PROVIDER_SITE_OTHER): Payer: Medicare PPO | Admitting: Urology

## 2017-07-06 DIAGNOSIS — R3915 Urgency of urination: Secondary | ICD-10-CM

## 2017-07-06 DIAGNOSIS — N401 Enlarged prostate with lower urinary tract symptoms: Secondary | ICD-10-CM

## 2017-07-25 ENCOUNTER — Telehealth: Payer: Self-pay | Admitting: Family Medicine

## 2017-07-25 ENCOUNTER — Other Ambulatory Visit: Payer: Self-pay | Admitting: Family Medicine

## 2017-07-25 MED ORDER — PREDNISONE 5 MG PO TABS: 5.0000 mg | ORAL_TABLET | Freq: Two times a day (BID) | ORAL | 0 refills | Status: AC

## 2017-07-25 NOTE — Telephone Encounter (Signed)
Returned patient call, patient denies any injury to the area, right hand, 'can't even pick up the phone with it' has been hurting for about 3 days, getting worse.

## 2017-07-25 NOTE — Telephone Encounter (Signed)
Patient informed of message below, verbalized understanding.  

## 2017-07-25 NOTE — Telephone Encounter (Signed)
5 day course of prednoiisone sent to his local pharmacy pls let his son know

## 2017-07-25 NOTE — Telephone Encounter (Signed)
Patient left message on nurse line for return call. States he has right wrist pain and his hand is swollen. Please call patient.

## 2017-07-29 ENCOUNTER — Other Ambulatory Visit: Payer: Self-pay | Admitting: Family Medicine

## 2017-08-04 ENCOUNTER — Other Ambulatory Visit: Payer: Self-pay | Admitting: Family Medicine

## 2017-08-11 ENCOUNTER — Other Ambulatory Visit: Payer: Self-pay | Admitting: Family Medicine

## 2017-08-11 ENCOUNTER — Telehealth: Payer: Self-pay

## 2017-08-11 MED ORDER — PREDNISONE 5 MG PO TABS: 5.0000 mg | ORAL_TABLET | Freq: Two times a day (BID) | ORAL | 0 refills | Status: AC

## 2017-08-11 NOTE — Telephone Encounter (Signed)
Pt is aware I spoke directly to him

## 2017-08-11 NOTE — Telephone Encounter (Signed)
Blanch Media, patients caregiver called concerned about patients hand.  He is experiencing increased swelling like he has had in the past but it is his right hand.  Patient would like to get prednisone rx as it helped in the past.  Please advise.

## 2017-08-11 NOTE — Telephone Encounter (Signed)
5 day course of prednisone sent to [facility]

## 2017-08-12 ENCOUNTER — Other Ambulatory Visit: Payer: Self-pay | Admitting: Family Medicine

## 2017-08-15 ENCOUNTER — Telehealth: Payer: Self-pay

## 2017-08-15 NOTE — Telephone Encounter (Signed)
Seen 7 [phone] 18

## 2017-08-15 NOTE — Telephone Encounter (Signed)
Pauls son left message asking for an appt. For his father.   cb # for son [phone].

## 2017-08-22 ENCOUNTER — Other Ambulatory Visit: Payer: Self-pay | Admitting: Family Medicine

## 2017-09-01 ENCOUNTER — Telehealth: Payer: Self-pay | Admitting: Family Medicine

## 2017-09-01 NOTE — Telephone Encounter (Signed)
Patient son called in to let you know that caregiver is concerned  About blisters that keep coming up on patients legs. He does not have any pain. Cb#: [phone]

## 2017-09-01 NOTE — Telephone Encounter (Signed)
Patient son called in to let you know that caregiver is concerned  About blisters that keep coming up on patients legs. He does not have any pain. Cb#: [phone]

## 2017-09-01 NOTE — Telephone Encounter (Signed)
Samuel Ryan (son) [phone] called stating that there is some Blistering on Left Leg & concerned due to patient benign a diabetic

## 2017-09-05 ENCOUNTER — Other Ambulatory Visit: Payer: Self-pay | Admitting: Family Medicine

## 2017-09-06 ENCOUNTER — Telehealth: Payer: Self-pay | Admitting: Family Medicine

## 2017-09-06 NOTE — Telephone Encounter (Signed)
I spoke directly with the pt today, he states his  blisters are improving with topical powder  He is using, denies puruklent drainage or fever, no medication is prescribed I asked him to inform his son and the sitter who called today. I have tried twice in the past to contact the son with no response

## 2017-09-06 NOTE — Telephone Encounter (Signed)
Home Aide calling regarding patient. States he has blisters covering left leg. Please call patient.

## 2017-09-06 NOTE — Telephone Encounter (Signed)
I called back and spoke witht he pt documented elsewhere

## 2017-09-14 ENCOUNTER — Other Ambulatory Visit: Payer: Self-pay

## 2017-09-14 ENCOUNTER — Ambulatory Visit: Payer: Self-pay | Admitting: Family Medicine

## 2017-09-14 ENCOUNTER — Inpatient Hospital Stay (HOSPITAL_COMMUNITY)
Admission: EM | Admit: 2017-09-14 | Discharge: 2017-09-19 | DRG: 194 | Disposition: A | Discharge status: Home Health | Payer: Medicare Other | Attending: Internal Medicine | Admitting: Internal Medicine

## 2017-09-14 ENCOUNTER — Emergency Department (HOSPITAL_COMMUNITY): Payer: Medicare Other

## 2017-09-14 ENCOUNTER — Encounter (HOSPITAL_COMMUNITY): Payer: Self-pay | Admitting: Emergency Medicine

## 2017-09-14 ENCOUNTER — Telehealth: Payer: Self-pay | Admitting: *Deleted

## 2017-09-14 DIAGNOSIS — E871 Hypo-osmolality and hyponatremia: Secondary | ICD-10-CM

## 2017-09-14 DIAGNOSIS — Z794 Long term (current) use of insulin: Secondary | ICD-10-CM

## 2017-09-14 DIAGNOSIS — E1122 Type 2 diabetes mellitus with diabetic chronic kidney disease: Secondary | ICD-10-CM | POA: Diagnosis not present

## 2017-09-14 DIAGNOSIS — N183 Chronic kidney disease, stage 3 unspecified: Secondary | ICD-10-CM

## 2017-09-14 DIAGNOSIS — F0390 Unspecified dementia without behavioral disturbance: Secondary | ICD-10-CM | POA: Diagnosis not present

## 2017-09-14 DIAGNOSIS — R062 Wheezing: Secondary | ICD-10-CM | POA: Diagnosis not present

## 2017-09-14 DIAGNOSIS — T380X5A Adverse effect of glucocorticoids and synthetic analogues, initial encounter: Secondary | ICD-10-CM | POA: Diagnosis not present

## 2017-09-14 DIAGNOSIS — I4891 Unspecified atrial fibrillation: Secondary | ICD-10-CM | POA: Insufficient documentation

## 2017-09-14 DIAGNOSIS — M7981 Nontraumatic hematoma of soft tissue: Secondary | ICD-10-CM | POA: Diagnosis not present

## 2017-09-14 DIAGNOSIS — J189 Pneumonia, unspecified organism: Secondary | ICD-10-CM | POA: Diagnosis not present

## 2017-09-14 DIAGNOSIS — N400 Enlarged prostate without lower urinary tract symptoms: Secondary | ICD-10-CM | POA: Diagnosis present

## 2017-09-14 DIAGNOSIS — N184 Chronic kidney disease, stage 4 (severe): Secondary | ICD-10-CM | POA: Diagnosis not present

## 2017-09-14 DIAGNOSIS — Z96651 Presence of right artificial knee joint: Secondary | ICD-10-CM | POA: Diagnosis present

## 2017-09-14 DIAGNOSIS — D631 Anemia in chronic kidney disease: Secondary | ICD-10-CM | POA: Diagnosis present

## 2017-09-14 DIAGNOSIS — IMO0002 Reserved for concepts with insufficient information to code with codable children: Secondary | ICD-10-CM

## 2017-09-14 DIAGNOSIS — Z79899 Other long term (current) drug therapy: Secondary | ICD-10-CM

## 2017-09-14 DIAGNOSIS — Z86711 Personal history of pulmonary embolism: Secondary | ICD-10-CM | POA: Diagnosis not present

## 2017-09-14 DIAGNOSIS — Z981 Arthrodesis status: Secondary | ICD-10-CM | POA: Diagnosis not present

## 2017-09-14 DIAGNOSIS — I129 Hypertensive chronic kidney disease with stage 1 through stage 4 chronic kidney disease, or unspecified chronic kidney disease: Secondary | ICD-10-CM | POA: Diagnosis not present

## 2017-09-14 DIAGNOSIS — E1129 Type 2 diabetes mellitus with other diabetic kidney complication: Secondary | ICD-10-CM

## 2017-09-14 DIAGNOSIS — R509 Fever, unspecified: Secondary | ICD-10-CM

## 2017-09-14 DIAGNOSIS — S301XXA Contusion of abdominal wall, initial encounter: Secondary | ICD-10-CM

## 2017-09-14 DIAGNOSIS — D649 Anemia, unspecified: Secondary | ICD-10-CM

## 2017-09-14 DIAGNOSIS — E1165 Type 2 diabetes mellitus with hyperglycemia: Secondary | ICD-10-CM

## 2017-09-14 LAB — CBC WITH DIFFERENTIAL/PLATELET
BASOS ABS: 0 10*3/uL (ref 0.0–0.1)
BASOS PCT: 0 %
EOS ABS: 0.1 10*3/uL (ref 0.0–0.7)
EOS PCT: 2 %
HCT: 36.2 % — ABNORMAL LOW (ref 39.0–52.0)
HEMOGLOBIN: 12 g/dL — AB (ref 13.0–17.0)
LYMPHS ABS: 1.3 10*3/uL (ref 0.7–4.0)
Lymphocytes Relative: 16 %
MCH: 30.5 pg (ref 26.0–34.0)
MCHC: 33.1 g/dL (ref 30.0–36.0)
MCV: 92.1 fL (ref 78.0–100.0)
Monocytes Absolute: 0.3 10*3/uL (ref 0.1–1.0)
Monocytes Relative: 4 %
NEUTROS PCT: 78 %
Neutro Abs: 6.4 10*3/uL (ref 1.7–7.7)
PLATELETS: 166 10*3/uL (ref 150–400)
RBC: 3.93 MIL/uL — AB (ref 4.22–5.81)
RDW: 13.9 % (ref 11.5–15.5)
WBC: 8.2 10*3/uL (ref 4.0–10.5)

## 2017-09-14 LAB — COMPREHENSIVE METABOLIC PANEL
ALT: 17 U/L (ref 17–63)
AST: 26 U/L (ref 15–41)
Albumin: 3.3 g/dL — ABNORMAL LOW (ref 3.5–5.0)
Alkaline Phosphatase: 70 U/L (ref 38–126)
Anion gap: 8 (ref 5–15)
BUN: 18 mg/dL (ref 6–20)
CALCIUM: 8.4 mg/dL — AB (ref 8.9–10.3)
CHLORIDE: 103 mmol/L (ref 101–111)
CO2: 22 mmol/L (ref 22–32)
CREATININE: 1.75 mg/dL — AB (ref 0.61–1.24)
GFR calc Af Amer: 39 mL/min — ABNORMAL LOW (ref >60)
GFR, EST NON AFRICAN AMERICAN: 34 mL/min — AB (ref >60)
Glucose, Bld: 253 mg/dL — ABNORMAL HIGH (ref 65–99)
POTASSIUM: 4.6 mmol/L (ref 3.5–5.1)
Sodium: 133 mmol/L — ABNORMAL LOW (ref 135–145)
TOTAL PROTEIN: 7.6 g/dL (ref 6.5–8.1)
Total Bilirubin: 0.9 mg/dL (ref 0.3–1.2)

## 2017-09-14 LAB — INFLUENZA PANEL BY PCR (TYPE A & B)
INFLAPCR: NEGATIVE
INFLBPCR: NEGATIVE

## 2017-09-14 LAB — LACTIC ACID, PLASMA
LACTIC ACID, VENOUS: 1.4 mmol/L (ref 0.5–1.9)
LACTIC ACID, VENOUS: 1 mmol/L (ref 0.5–1.9)

## 2017-09-14 LAB — TROPONIN I

## 2017-09-14 LAB — GLUCOSE, CAPILLARY: GLUCOSE-CAPILLARY: 182 mg/dL — AB (ref 65–99)

## 2017-09-14 MED ORDER — SERTRALINE HCL 50 MG PO TABS
50.0000 mg | ORAL_TABLET | Freq: Every day | ORAL | Status: DC
  Administered 2017-09-15 – 2017-09-19 (×5): 50 mg via ORAL
  Filled 2017-09-14 (×5): qty 1

## 2017-09-14 MED ORDER — POTASSIUM CHLORIDE IN NACL 20-0.9 MEQ/L-% IV SOLN
INTRAVENOUS | Status: AC
  Administered 2017-09-15: 05:00:00 via INTRAVENOUS

## 2017-09-14 MED ORDER — GUAIFENESIN-DM 100-10 MG/5ML PO SYRP: 5.0000 mL | ORAL_SOLUTION | Freq: Two times a day (BID) | ORAL | Status: DC | PRN

## 2017-09-14 MED ORDER — FERROUS SULFATE 325 (65 FE) MG PO TABS
325.0000 mg | ORAL_TABLET | Freq: Two times a day (BID) | ORAL | Status: DC
  Administered 2017-09-15 – 2017-09-16 (×3): 325 mg via ORAL
  Filled 2017-09-14 (×3): qty 1

## 2017-09-14 MED ORDER — PRAVASTATIN SODIUM 40 MG PO TABS
40.0000 mg | ORAL_TABLET | Freq: Every day | ORAL | Status: DC
  Administered 2017-09-15 – 2017-09-18 (×4): 40 mg via ORAL
  Filled 2017-09-14 (×4): qty 1

## 2017-09-14 MED ORDER — TAMSULOSIN HCL 0.4 MG PO CAPS
0.4000 mg | ORAL_CAPSULE | Freq: Every day | ORAL | Status: DC
  Administered 2017-09-15 – 2017-09-19 (×5): 0.4 mg via ORAL
  Filled 2017-09-14 (×5): qty 1

## 2017-09-14 MED ORDER — ACETAMINOPHEN 650 MG RE SUPP: 650.0000 mg | Freq: Four times a day (QID) | RECTAL | Status: DC | PRN

## 2017-09-14 MED ORDER — LORATADINE 10 MG PO TABS
10.0000 mg | ORAL_TABLET | Freq: Every morning | ORAL | Status: DC
  Administered 2017-09-15 – 2017-09-19 (×5): 10 mg via ORAL
  Filled 2017-09-14 (×5): qty 1

## 2017-09-14 MED ORDER — FLUTICASONE PROPIONATE 50 MCG/ACT NA SUSP: 2.0000 | Freq: Every day | NASAL | Status: DC | PRN

## 2017-09-14 MED ORDER — HYPROMELLOSE (GONIOSCOPIC) 2.5 % OP SOLN
1.0000 [drp] | OPHTHALMIC | Status: DC | PRN
  Filled 2017-09-14: qty 15

## 2017-09-14 MED ORDER — LEVOFLOXACIN IN D5W 750 MG/150ML IV SOLN: 750.0000 mg | Freq: Once | INTRAVENOUS | Status: DC

## 2017-09-14 MED ORDER — SODIUM CHLORIDE 0.9 % IV BOLUS (SEPSIS)
1000.0000 mL | Freq: Once | INTRAVENOUS | Status: AC
  Administered 2017-09-14: 1000 mL via INTRAVENOUS

## 2017-09-14 MED ORDER — INSULIN ASPART 100 UNIT/ML ~~LOC~~ SOLN
0.0000 [IU] | Freq: Every day | SUBCUTANEOUS | Status: DC
  Administered 2017-09-16: 4 [IU] via SUBCUTANEOUS
  Administered 2017-09-17: 3 [IU] via SUBCUTANEOUS
  Administered 2017-09-18: 5 [IU] via SUBCUTANEOUS

## 2017-09-14 MED ORDER — ACETAMINOPHEN 500 MG PO TABS
ORAL_TABLET | ORAL | Status: AC
  Administered 2017-09-14: 1000 mg
  Filled 2017-09-14: qty 2

## 2017-09-14 MED ORDER — ACETAMINOPHEN 325 MG PO TABS
650.0000 mg | ORAL_TABLET | Freq: Four times a day (QID) | ORAL | Status: DC | PRN
  Administered 2017-09-16: 650 mg via ORAL
  Filled 2017-09-14: qty 2

## 2017-09-14 MED ORDER — INSULIN ASPART 100 UNIT/ML ~~LOC~~ SOLN
0.0000 [IU] | Freq: Three times a day (TID) | SUBCUTANEOUS | Status: DC
Start: 2017-09-15 — End: 2017-09-19
  Administered 2017-09-15: 2 [IU] via SUBCUTANEOUS
  Administered 2017-09-16: 1 [IU] via SUBCUTANEOUS
  Administered 2017-09-16: 7 [IU] via SUBCUTANEOUS
  Administered 2017-09-16: 3 [IU] via SUBCUTANEOUS
  Administered 2017-09-17: 7 [IU] via SUBCUTANEOUS
  Administered 2017-09-17: 5 [IU] via SUBCUTANEOUS
  Administered 2017-09-17: 2 [IU] via SUBCUTANEOUS
  Administered 2017-09-18: 9 [IU] via SUBCUTANEOUS
  Administered 2017-09-18: 1 [IU] via SUBCUTANEOUS
  Administered 2017-09-18: 3 [IU] via SUBCUTANEOUS
  Administered 2017-09-19 (×2): 2 [IU] via SUBCUTANEOUS

## 2017-09-14 MED ORDER — DONEPEZIL HCL 5 MG PO TABS
10.0000 mg | ORAL_TABLET | Freq: Every day | ORAL | Status: DC
  Administered 2017-09-15 – 2017-09-18 (×4): 10 mg via ORAL
  Filled 2017-09-14 (×4): qty 2

## 2017-09-14 MED ORDER — ENOXAPARIN SODIUM 40 MG/0.4ML ~~LOC~~ SOLN
40.0000 mg | SUBCUTANEOUS | Status: DC
  Administered 2017-09-15: 40 mg via SUBCUTANEOUS
  Filled 2017-09-14: qty 0.4

## 2017-09-14 MED ORDER — FUROSEMIDE 20 MG PO TABS
20.0000 mg | ORAL_TABLET | Freq: Every day | ORAL | Status: DC
  Administered 2017-09-15 – 2017-09-19 (×5): 20 mg via ORAL
  Filled 2017-09-14 (×5): qty 1

## 2017-09-14 MED ORDER — PEN NEEDLES 31G X 5 MM MISC: 1.0000 | Status: DC

## 2017-09-14 MED ORDER — LEVOFLOXACIN IN D5W 750 MG/150ML IV SOLN
750.0000 mg | Freq: Once | INTRAVENOUS | Status: AC
  Administered 2017-09-14: 750 mg via INTRAVENOUS
  Filled 2017-09-14: qty 150

## 2017-09-14 MED ORDER — TRAMADOL HCL 50 MG PO TABS: 50.0000 mg | ORAL_TABLET | Freq: Three times a day (TID) | ORAL | Status: DC | PRN

## 2017-09-14 NOTE — ED Provider Notes (Signed)
[facility] EMERGENCY DEPARTMENT Provider Note   CSN: [id number] Arrival date & time: 09/14/17  1425     History   Chief Complaint Chief Complaint  Patient presents with  . Cough    HPI Samuel Ryan is a 83 y.o. male.  The history is provided by the patient and a relative. The history is limited by the condition of the patient (Hx dementia).  Cough   Pt was seen at [place]. Per pt's son: Pt with intermittent subjective home fevers and cough/congestion for the past 2 days. Pt too weak to stand/walk. Denies N/V/D, no abd pain, no CP.   Past Medical History:  Diagnosis Date  . Acute cholecystitis 11/2012   Drained percutaneously  . BPH (benign prostatic hypertrophy)   . Deep venous thrombosis (HCC)    Left leg, diagnosed 7/12  . Dementia    Needs supervision for safety per son - POA  . Diverticulitis   . Essential hypertension, benign   . History of pneumonia    Prior to 2012  . Incontinence of urine   . Mixed hyperlipidemia   . Osteoarthritis   . Pulmonary embolism ([place])    Diagnosed 7/12  . Type 2 diabetes mellitus [facility])     Patient Active Problem List   Diagnosis Date Noted  . Anemia 09/14/2017  . CKD (chronic kidney disease) stage 3, GFR 30-59 ml/min (HCC) 09/14/2017  . Hyponatremia 09/14/2017  . Atrial fibrillation ([place]) 09/14/2017  . Near syncope 03/07/2017  . Hypoalbuminemia 11/23/2016  . CAP (community acquired pneumonia) 10/29/2016  . At high risk for falls 03/30/2015  . Generalized osteoarthritis 07/16/2014  . Urinary incontinence 07/16/2014  . Pulmonary interstitial fibrosis ([place]) 07/03/2014  . Abnormal CXR (chest x-ray) 05/27/2014  . Thyroid mass of unclear etiology 05/07/2014  . Vitamin D deficiency 01/31/2014  . Allergic rhinitis 01/31/2014  . Depression due to dementia 02/25/2013  . Diverticulosis of sigmoid colon 12/13/2012  . Right ventricular dysfunction 07/20/2011  . Heart disease, unspecified 07/20/2011  . Alzheimer's dementia without  behavioral disturbance 09/12/2010  . Goiter 06/19/2009  . Essential hypertension 12/11/2008  . Type 2 diabetes, uncontrolled, with renal manifestation ([place]) 09/05/2008  . Hyperlipidemia with target LDL less than 100 07/19/2008  . BENIGN PROSTATIC HYPERTROPHY, HX OF 03/28/2008    Past Surgical History:  Procedure Laterality Date  . CARPAL TUNNEL RELEASE    . COLONOSCOPY  08/31/2012   Procedure: COLONOSCOPY;  Surgeon: Rogene Houston, MD;  Location: [facility];  Service: Endoscopy;  Laterality: N/A;  830  . CYSTOSCOPY WITH INSERTION OF UROLIFT N/A 09/02/2016   Procedure: CYSTOSCOPY WITH INSERTION OF UROLIFT;  Surgeon: Cleon Gustin, MD;  Location[facility];  Service: Urology;  Laterality: N/A;  . EYE SURGERY Bilateral    bilateral cataract  . Right total knee replacement  04/23/2011  . SPINE SURGERY  prior to 2012   Neck fusion  . UMBILICAL HERNIA REPAIR     umbilical       Home Medications    Prior to Admission medications   Medication Sig Start Date End Date Taking? Authorizing Provider  acetaminophen (TYLENOL) 500 MG tablet Take 1,000 mg by mouth every 6 (six) hours as needed for mild pain, moderate pain or headache.    Yes [provider]  Carboxymethylcellul-Glycerin 0.5-0.9 % SOLN Place 1 drop into both eyes as needed (for dry eyes).   Yes [provider]  Dextromethorphan-Guaifenesin ([place] FAST-MAX DM MAX) 5-100 MG/5ML LIQD Take  10-15 mLs by mouth 2 (two) times daily as needed (FOR COUGH AND CONGESTION).   Yes [provider]  donepezil (ARICEPT) 10 MG tablet TAKE 1 TABLET AT BEDTIME 08/15/17  Yes [name], MD  FEROSUL 325 (65 Fe) MG tablet TAKE 1 TABLET TWICE DAILY 08/15/17  Yes [name], MD  fluticasone [facility]) 50 MCG/ACT nasal spray Place 2 sprays into both nostrils daily as needed for rhinitis. 03/11/17  Yes [name], MD  furosemide (LASIX) 20 MG tablet Take 1 tablet (20 mg total) by  mouth daily. 04/01/17 09/14/17 Yes [place], [place], PA-C  glipiZIDE (GLUCOTROL XL) 10 MG 24 hr tablet TAKE TWO TABLETS EVERY MORNING AT BREAKFAST Patient taking differently: Take 20 mg by mouth daily with breakfast.  03/11/17  Yes [name], MD  LANTUS SOLOSTAR 100 UNIT/ML Solostar Pen INJECT 15 UNITS SQ ONCE DAILY AT 10PM Patient taking differently: INJECT 15 UNITS SQ ONCE DAILY 08/23/17  Yes Raylene Everts, MD  loratadine (CLARITIN) 10 MG tablet Take 10 mg by mouth every morning.    Yes [provider]  lovastatin (MEVACOR) 40 MG tablet TAKE 1 TABLET AT BEDTIME 08/15/17  Yes [name], MD  sertraline (ZOLOFT) 50 MG tablet TAKE 1 TABLET EVERY DAY 08/15/17  Yes [name], MD  tamsulosin (FLOMAX) 0.4 MG CAPS capsule TAKE 1 CAPSULE EVERY DAY 08/15/17  Yes [name], MD  traMADol (ULTRAM) 50 MG tablet TAKE  (1)  TABLET  EVERY EIGHT HOURS AS NEEDED. Patient taking differently: TAKE  (1)  TABLET  EVERY EIGHT HOURS AS NEEDED FOR PAIN 07/06/17  Yes [name], MD  Trospium Chloride 60 MG CP24 Take 60 mg by mouth daily.  06/02/17  Yes [provider]  Vitamin D, Ergocalciferol, (DRISDOL) 50000 units CAPS capsule TAKE 1 CAPSULE EVERY 7 (SEVEN) DAYS. Patient taking differently: TAKE 1 CAPSULE EVERY 7 (SEVEN) DAYS ON FRIDAYS 09/06/17  Yes [name], MD  Insulin Pen Needle (PEN NEEDLES) 31G X 5 MM MISC 1 each by Does not apply route as directed. Use to inject lantus daily dx E11.65 02/08/17   [name], MD  TRUE METRIX BLOOD GLUCOSE TEST test strip TEST TWO TIMES DAILY 05/09/17   [name], MD    Family History Family History  Problem Relation Age of Onset  . Cancer Mother        Stomach  . Diabetes Sister   . Hypertension Sister   . Diabetes Brother   . Cancer Brother        Gallbladder  . Seizures Son        due to meningitis as a child    Social History Social History  Substance Use Topics  . Smoking  status: Never Smoker  . Smokeless tobacco: Never Used  . Alcohol use No     Allergies   Ace inhibitors; Vancomycin; and Zosyn [piperacillin sod-tazobactam so]   Review of Systems Review of Systems  Unable to perform ROS: Dementia  Respiratory: Positive for cough.      Physical Exam Updated Vital Signs BP (!) 159/86   Pulse 99   Temp (!) 100.5 F (38.1 C) (Oral)   Resp (!) 28   Ht 5\' 10"  (1.778 m)   Wt 120.7 kg (266 lb)   SpO2 94%   BMI 38.17 kg/m    Patient Vitals for the past 24 hrs:  BP Temp Temp src Pulse Resp SpO2 Height Weight  09/14/17 2045 - - - 99 (!) 28 94 % - -  09/14/17 2030 (!) 159/86 - - 97 (!) 29 94 % - -  09/14/17 2000 (!) 142/94 - - 100 (!) 31 96 % - -  09/14/17 1950 - (!) 100.5 F (38.1 C) Oral - - - - -  09/14/17 1930 (!) 162/89 - - - (!) 26 - - -  09/14/17 1915 - - - - (!) 24 - - -  09/14/17 1900 (!) 154/82 - - - (!) 24 - - -  09/14/17 1855 (!) 159/84 - - - (!) 27 - - -  09/14/17 1837 (!) 158/95 (!) 103 F (39.4 C) - 91 20 (!) 89 % - -  09/14/17 1444 (!) 150/69 98.7 F (37.1 C) - 92 20 98 % 5\' 10"  (1.778 m) 120.7 kg (266 lb)      20:00 Orthostatic Vital Signs LN  Orthostatic Lying   BP- Lying: 162/89  Pulse- Lying: 96      Orthostatic Sitting  BP- Sitting:  142/94  Pulse- Sitting: 96      Physical Exam 1900: Physical examination:  Nursing notes reviewed; Vital signs and O2 SAT reviewed;  Constitutional: Well developed, Well nourished, Well hydrated, In no acute distress; Head:  Normocephalic, atraumatic; Eyes: EOMI, PERRL, No scleral icterus; ENMT: Mouth and pharynx normal, Mucous membranes moist; Neck: Supple, Full range of motion, No lymphadenopathy; Cardiovascular: Regular rate and rhythm, No gallop; Respiratory: Breath sounds coarse & equal bilaterally, No wheezes.  Speaking full sentences with ease, Normal respiratory effort/excursion; Chest: Nontender, Movement normal; Abdomen: Soft, Nontender, Nondistended, Normal bowel sounds;  Genitourinary: No CVA tenderness; Extremities: Pulses normal, No tenderness, No deformity..; Neuro: Awake, alert, confused per hx dementia, No facial droop. Speech clear. Moves all extremities on stretcher spontaneously..; Skin: Color normal, Warm, Dry.   ED Treatments / Results  Labs (all labs ordered are listed, but only abnormal results are displayed)   EKG  EKG Interpretation  Date/Time:  Wednesday September 14 2017 18:51:51 EDT Ventricular Rate:  94 PR Interval:    QRS Duration: 141 QT Interval:  352 QTC Calculation: 441 R Axis:   65 Text Interpretation:  Sinus rhythm Right bundle branch block When compared with ECG of 02/21/2017 No significant change was found Confirmed by Francine Graven [phone]) on 09/14/2017 7:21:52 PM       Radiology   Procedures Procedures (including critical care time)  Medications Ordered in ED Medications  levofloxacin (LEVAQUIN) IVPB 750 mg (750 mg Intravenous New Bag/Given 09/14/17 2058)  acetaminophen (TYLENOL) 500 MG tablet (1,000 mg  Given 09/14/17 1847)  sodium chloride 0.9 % bolus 1,000 mL (0 mLs Intravenous Stopped 09/14/17 2058)     Initial Impression / Assessment and Plan / ED Course  I have reviewed the triage vital signs and the nursing notes.  Pertinent labs & imaging results that were available during my care of the patient were reviewed by me and considered in my medical decision making (see chart for details).  MDM Reviewed: previous chart, nursing note and vitals Reviewed previous: labs and ECG Interpretation: labs, ECG and x-ray   Results for orders placed or performed during the hospital encounter of 09/14/17  Comprehensive metabolic panel  Result Value Ref Range   Sodium 133 (L) 135 - 145 mmol/L   Potassium 4.6 3.5 - 5.1 mmol/L   Chloride 103 101 - 111 mmol/L   CO2 22 22 - 32 mmol/L   Glucose, Bld 253 (H) 65 - 99 mg/dL  BUN 18 6 - 20 mg/dL   Creatinine, Ser 1.75 (H) 0.61 - 1.24 mg/dL   Calcium 8.4 (L) 8.9 -  10.3 mg/dL   Total Protein 7.6 6.5 - 8.1 g/dL   Albumin 3.3 (L) 3.5 - 5.0 g/dL   AST 26 15 - 41 U/L   ALT 17 17 - 63 U/L   Alkaline Phosphatase 70 38 - 126 U/L   Total Bilirubin 0.9 0.3 - 1.2 mg/dL   GFR calc non Af Amer 34 (L) >60 mL/min   GFR calc Af Amer 39 (L) >60 mL/min   Anion gap 8 5 - 15  CBC with Differential  Result Value Ref Range   WBC 8.2 4.0 - 10.5 K/uL   RBC 3.93 (L) 4.22 - 5.81 MIL/uL   Hemoglobin 12.0 (L) 13.0 - 17.0 g/dL   HCT 36.2 (L) 39.0 - 52.0 %   MCV 92.1 78.0 - 100.0 fL   MCH 30.5 26.0 - 34.0 pg   MCHC 33.1 30.0 - 36.0 g/dL   RDW 13.9 11.5 - 15.5 %   Platelets 166 150 - 400 K/uL   Neutrophils Relative % 78 %   Neutro Abs 6.4 1.7 - 7.7 K/uL   Lymphocytes Relative 16 %   Lymphs Abs 1.3 0.7 - 4.0 K/uL   Monocytes Relative 4 %   Monocytes Absolute 0.3 0.1 - 1.0 K/uL   Eosinophils Relative 2 %   Eosinophils Absolute 0.1 0.0 - 0.7 K/uL   Basophils Relative 0 %   Basophils Absolute 0.0 0.0 - 0.1 K/uL  Glucose, capillary  Result Value Ref Range   Glucose-Capillary 182 (H) 65 - 99 mg/dL  Lactic acid, plasma  Result Value Ref Range   Lactic Acid, Venous 1.4 0.5 - 1.9 mmol/L  Troponin I  Result Value Ref Range   Troponin I <0.03 <0.03 ng/mL   Dg Chest 2 View Result Date: 09/14/2017 CLINICAL DATA:  Productive cough and congestion for 1 week EXAM: CHEST  2 VIEW COMPARISON:  02/21/2017 FINDINGS: Chronic cardiomegaly is again identified and stable. Mild vascular congestion is noted. Additionally some bibasilar scarring is noted. Increased density is noted in the left base likely related to acute on chronic infiltrate. Small left pleural effusion is noted. IMPRESSION: Chronic cardiomegaly. Mild vascular congestion. Acute on chronic infiltrate in the left base. Electronically Signed   By: Inez Catalina M.D.   On: 09/14/2017 15:09    2120:  IV levaquin started for CAP. Pt unable to stand for orthostatic VS. Dx and testing d/w pt and family.  Questions answered.   Verb understanding, agreeable to admit.   T/C to [facility] Dr. Maudie Mercury, case discussed, including:  HPI, pertinent PM/SHx, VS/PE, dx testing, ED course and treatment:  Agreeable to admit.   Final Clinical Impressions(s) / ED Diagnoses   Final diagnoses:  Fever in adult  Community acquired pneumonia, unspecified laterality    New Prescriptions New Prescriptions   No medications on file      Francine Graven, DO 09/17/17 1516

## 2017-09-14 NOTE — H&P (Signed)
TRH H&P   Patient Demographics:    Samuel Ryan, is a 83 y.o. male  MRN: [id number]   DOB - 03/08/1931  Admit Date - 09/14/2017  Outpatient Primary MD for the patient is [name], MD  Referring MD/NP/PA: [email or web address]  Outpatient Specialists:   Patient coming from: home  Chief Complaint  Patient presents with  . Cough      HPI:    Samuel Ryan  is a 83 y.o. male, w dementia, apparently presents with cough, w yellow sputum starting yesterday.  Couldn't get in to see pcp because elevator broken and sent to ED.   In ED, pt febrile to 103,  Wbc 8.2, hgb 12.0, Plt 166 Na 133 K 4.6, Glucose 253 Bun 18, Creatinine 1.75.  Trop <0.03    CXR=>  IMPRESSION: Chronic cardiomegaly.  Mild vascular congestion.  Acute on chronic infiltrate in the left base.   Pt will be admitted for CAP    Review of systems:    In addition to the HPI above,  No Fever-chills, No Headache, No changes with Vision or hearing, No problems swallowing food or Liquids, No Chest pain, Pt unable to tell me if dyspnea, due to dementia  No Abdominal pain, No Nausea or Vommitting, Bowel movements are regular, No Blood in stool or Urine, No dysuria, No new skin rashes or bruises, No new joints pains-aches,  No new weakness, tingling, numbness in any extremity, No recent weight gain or loss, No polyuria, polydypsia or polyphagia, No significant Mental Stressors.  A full 10 point Review of Systems was done, except as stated above, all other Review of Systems were negative.   With Past History of the following :    Past Medical History:  Diagnosis Date  . Acute cholecystitis 11/2012   Drained percutaneously  . BPH (benign prostatic hypertrophy)   . Deep venous thrombosis (HCC)    Left leg, diagnosed 7/12  . Dementia    Needs supervision for safety per son - POA  .  Diverticulitis   . Essential hypertension, benign   . History of pneumonia    Prior to 2012  . Incontinence of urine   . Mixed hyperlipidemia   . Osteoarthritis   . Pulmonary embolism ([place])    Diagnosed 7/12  . Type 2 diabetes mellitus ([place])       Past Surgical History:  Procedure Laterality Date  . CARPAL TUNNEL RELEASE    . COLONOSCOPY  08/31/2012   Procedure: COLONOSCOPY;  Surgeon: Rogene Houston, MD;  Location: [facility];  Service: Endoscopy;  Laterality: N/A;  830  . CYSTOSCOPY WITH INSERTION OF UROLIFT N/A 09/02/2016   Procedure: CYSTOSCOPY WITH INSERTION OF UROLIFT;  Surgeon: Cleon Gustin, MD;  Location: [facility];  Service: Urology;  Laterality: N/A;  . EYE SURGERY Bilateral    bilateral cataract  . Right total  knee replacement  04/23/2011  . SPINE SURGERY  prior to 2012   Neck fusion  . UMBILICAL HERNIA REPAIR     umbilical      Social History:     Social History  Substance Use Topics  . Smoking status: Never Smoker  . Smokeless tobacco: Never Used  . Alcohol use No     Lives - at home  Mobility - walks w assistance   Family History :     Family History  Problem Relation Age of Onset  . Cancer Mother        Stomach  . Diabetes Sister   . Hypertension Sister   . Diabetes Brother   . Cancer Brother        Gallbladder  . Seizures Son        due to meningitis as a child      Home Medications:   Prior to Admission medications   Medication Sig Start Date End Date Taking? Authorizing Provider  acetaminophen (TYLENOL) 500 MG tablet Take 1,000 mg by mouth every 6 (six) hours as needed for mild pain, moderate pain or headache.    Yes [provider]  Carboxymethylcellul-Glycerin 0.5-0.9 % SOLN Place 1 drop into both eyes as needed (for dry eyes).   Yes [provider]  Dextromethorphan-Guaifenesin ([place] FAST-MAX DM MAX) 5-100 MG/5ML LIQD Take 10-15 mLs by mouth 2 (two) times daily as needed (FOR COUGH AND  CONGESTION).   Yes [provider]  donepezil (ARICEPT) 10 MG tablet TAKE 1 TABLET AT BEDTIME 08/15/17  Yes [name], MD  FEROSUL 325 (65 Fe) MG tablet TAKE 1 TABLET TWICE DAILY 08/15/17  Yes [name], MD  fluticasone [facility]) 50 MCG/ACT nasal spray Place 2 sprays into both nostrils daily as needed for rhinitis. 03/11/17  Yes [name], MD  furosemide (LASIX) 20 MG tablet Take 1 tablet (20 mg total) by mouth daily. 04/01/17 09/14/17 Yes Strader, [place], PA-C  glipiZIDE (GLUCOTROL XL) 10 MG 24 hr tablet TAKE TWO TABLETS EVERY MORNING AT BREAKFAST Patient taking differently: Take 20 mg by mouth daily with breakfast.  03/11/17  Yes [name], MD  LANTUS SOLOSTAR 100 UNIT/ML Solostar Pen INJECT 15 UNITS SQ ONCE DAILY AT 10PM Patient taking differently: INJECT 15 UNITS SQ ONCE DAILY 08/23/17  Yes Raylene Everts, MD  loratadine (CLARITIN) 10 MG tablet Take 10 mg by mouth every morning.    Yes [provider]  lovastatin (MEVACOR) 40 MG tablet TAKE 1 TABLET AT BEDTIME 08/15/17  Yes [name], MD  sertraline (ZOLOFT) 50 MG tablet TAKE 1 TABLET EVERY DAY 08/15/17  Yes [name], MD  tamsulosin (FLOMAX) 0.4 MG CAPS capsule TAKE 1 CAPSULE EVERY DAY 08/15/17  Yes [name], MD  traMADol (ULTRAM) 50 MG tablet TAKE  (1)  TABLET  EVERY EIGHT HOURS AS NEEDED. Patient taking differently: TAKE  (1)  TABLET  EVERY EIGHT HOURS AS NEEDED FOR PAIN 07/06/17  Yes [name], MD  Trospium Chloride 60 MG CP24 Take 60 mg by mouth daily.  06/02/17  Yes [provider]  Vitamin D, Ergocalciferol, (DRISDOL) 50000 units CAPS capsule TAKE 1 CAPSULE EVERY 7 (SEVEN) DAYS. Patient taking differently: TAKE 1 CAPSULE EVERY 7 (SEVEN) DAYS ON FRIDAYS 09/06/17  Yes [name], MD  Insulin Pen Needle (PEN NEEDLES) 31G X 5 MM MISC 1 each by Does not apply route as directed. Use to inject lantus daily dx  E11.65 02/08/17   [name], MD  TRUE METRIX BLOOD GLUCOSE TEST test strip TEST TWO TIMES DAILY 05/09/17   [name], MD     Allergies:     Allergies  Allergen Reactions  . Ace Inhibitors Cough  . Vancomycin Swelling and Other (See Comments)    Reaction:  Facial swelling  . Zosyn [Piperacillin Sod-Tazobactam So] Swelling and Other (See Comments)    Reaction:  Facial swelling     Physical Exam:   Vitals  Blood pressure (!) 159/86, pulse 99, temperature (!) 100.5 F (38.1 C), temperature source Oral, resp. rate (!) 28, height 5\' 10"  (1.778 m), weight 120.7 kg (266 lb), SpO2 94 %.   1. General lying in bed in NAD,   2. Normal affect and insight, Not Suicidal or Homicidal, Awake Alert, Oriented X 3.  3. No F.N deficits, ALL C.Nerves Intact, Strength 5/5 all 4 extremities, Sensation intact all 4 extremities, Plantars down going.  4. Ears and Eyes appear Normal, Conjunctivae clear, PERRLA. Moist Oral Mucosa.  5. Supple Neck, No JVD, No cervical lymphadenopathy appriciated, No Carotid Bruits.  6. Symmetrical Chest wall movement, Good air movement bilaterally, slight crackles left base, no wheezing  7. RRR, No Gallops, Rubs or Murmurs, No Parasternal Heave.  8. Positive Bowel Sounds, Abdomen Soft, No tenderness, No organomegaly appriciated,No rebound -guarding or rigidity.  9.  No Cyanosis, Normal Skin Turgor, No Skin Rash or Bruise.  10. Good muscle tone,  joints appear normal , no effusions, Normal ROM.  11. No Palpable Lymph Nodes in Neck or Axillae    Data Review:    CBC  Recent Labs Lab 09/14/17 1546  WBC 8.2  HGB 12.0*  HCT 36.2*  PLT 166  MCV 92.1  MCH 30.5  MCHC 33.1  RDW 13.9  LYMPHSABS 1.3  MONOABS 0.3  EOSABS 0.1  BASOSABS 0.0   ------------------------------------------------------------------------------------------------------------------  Chemistries   Recent Labs Lab 09/14/17 1546  NA 133*  K 4.6  CL 103  CO2 22  GLUCOSE 253*  BUN 18    CREATININE 1.75*  CALCIUM 8.4*  AST 26  ALT 17  ALKPHOS 70  BILITOT 0.9   ------------------------------------------------------------------------------------------------------------------ estimated creatinine clearance is 40.9 mL/min (A) (by C-G formula based on SCr of 1.75 mg/dL (H)). ------------------------------------------------------------------------------------------------------------------ No results for input(s): TSH, T4TOTAL, T3FREE, THYROIDAB in the last 72 hours.  Invalid input(s): FREET3  Coagulation profile No results for input(s): INR, PROTIME in the last 168 hours. ------------------------------------------------------------------------------------------------------------------- No results for input(s): DDIMER in the last 72 hours. -------------------------------------------------------------------------------------------------------------------  Cardiac Enzymes  Recent Labs Lab 09/14/17 1921  TROPONINI <0.03   ------------------------------------------------------------------------------------------------------------------    Component Value Date/Time   BNP 37.0 02/21/2017 2044     ---------------------------------------------------------------------------------------------------------------  Urinalysis    Component Value Date/Time   COLORURINE YELLOW 02/21/2017 2013   APPEARANCEUR CLEAR 02/21/2017 2013   LABSPEC 1.015 02/21/2017 2013   PHURINE 5.0 02/21/2017 2013   GLUCOSEU NEGATIVE 02/21/2017 2013   HGBUR MODERATE (A) 02/21/2017 2013   BILIRUBINUR NEGATIVE 02/21/2017 2013   BILIRUBINUR negative 03/05/2016 1254   KETONESUR NEGATIVE 02/21/2017 2013   PROTEINUR NEGATIVE 02/21/2017 2013   UROBILINOGEN 1.0 03/05/2016 1254   UROBILINOGEN 0.2 06/02/2015 1310   NITRITE NEGATIVE 02/21/2017 2013   LEUKOCYTESUR NEGATIVE 02/21/2017 2013     ----------------------------------------------------------------------------------------------------------------   Imaging Results:    Dg Chest 2 View  Result Date: 09/14/2017 CLINICAL DATA:  Productive cough and congestion for 1 week EXAM: CHEST  2 VIEW COMPARISON:  02/21/2017 FINDINGS: Chronic cardiomegaly is again identified and stable. Mild vascular congestion is noted. Additionally some bibasilar scarring is noted. Increased density is noted in the left base likely related to acute on chronic infiltrate. Small left pleural effusion is noted. IMPRESSION: Chronic cardiomegaly. Mild vascular congestion. Acute on chronic infiltrate in the left base. Electronically Signed   By: Inez Catalina M.D.   On: 09/14/2017 15:09     Assessment & Plan:    Principal Problem:   CAP (community acquired pneumonia) Active Problems:   Anemia   CKD (chronic kidney disease) stage 3, GFR 30-59 ml/min (HCC)   Hyponatremia   Fever CAP Blood culture x2 Urine strep antigen, urine legionella antigen Influenza Start levaquin pharmacy to dose   Hyponatremia Hydrate with ns iv Check cmp in am   Anemia Repeat cbc in am  CKD stage 4 Check cmp in am  Dm2 fsbs ac and qhs, ISS  Hyperlipidemia Cont mevacor  Dementia Cont aricept     DVT Prophylaxis Heparin - - SCDs   AM Labs Ordered, also please review Full Orders  Family Communication: Admission, patients condition and plan of care including tests being ordered have been discussed with the patient who indicate understanding and agree with the plan and Code Status.  Code Status FULL CODE  Likely DC to home  Condition GUARDED    Consults called:  PT to evaluate and tx, speech to evaluate and tx  Admission status: inpatient  Time spent in minutes : 45   Jani Gravel M.D on 09/14/2017 at 9:30 PM  Between 7am to 7pm - Pager - [phone]. After 7pm go to [email or web address] - password [facility]  [facility] - Office   [phone]

## 2017-09-14 NOTE — ED Triage Notes (Signed)
PT c/o productive brownish color sputum with cold sweats x2 days.

## 2017-09-14 NOTE — ED Notes (Signed)
Pt very weak,  Leslie, RN and I had difficultly getting pt from wheelchair to bed.  Pt was unable to even shuffle or pivot to bed at all.

## 2017-09-14 NOTE — ED Notes (Signed)
Pt's son states pt had an Urolift.

## 2017-09-14 NOTE — Telephone Encounter (Signed)
Patient was advised ED per Dr. Moshe Cipro

## 2017-09-14 NOTE — Telephone Encounter (Signed)
Dr Moshe Cipro, Mr Samuel Ryan isn't able to climb the stairs (due to elevator not working) . He is schedule for a appointment today.his concern is a cough with dark phlegm & worried about possible flu or pneumonia. Was planning on getting a FLU shot today during visit. Please Call  [phone] with advice

## 2017-09-14 NOTE — ED Notes (Signed)
Pt to weak to stand on side of the bed for orthostatic vital,but did get the lying and sitting position for orthostatic vitals.

## 2017-09-15 ENCOUNTER — Encounter (HOSPITAL_COMMUNITY): Payer: Self-pay | Admitting: *Deleted

## 2017-09-15 DIAGNOSIS — N183 Chronic kidney disease, stage 3 (moderate): Secondary | ICD-10-CM | POA: Diagnosis not present

## 2017-09-15 DIAGNOSIS — S301XXA Contusion of abdominal wall, initial encounter: Secondary | ICD-10-CM | POA: Diagnosis not present

## 2017-09-15 DIAGNOSIS — E119 Type 2 diabetes mellitus without complications: Secondary | ICD-10-CM | POA: Diagnosis not present

## 2017-09-15 DIAGNOSIS — R509 Fever, unspecified: Secondary | ICD-10-CM | POA: Diagnosis not present

## 2017-09-15 DIAGNOSIS — Z794 Long term (current) use of insulin: Secondary | ICD-10-CM | POA: Diagnosis not present

## 2017-09-15 DIAGNOSIS — D649 Anemia, unspecified: Secondary | ICD-10-CM | POA: Diagnosis not present

## 2017-09-15 DIAGNOSIS — E871 Hypo-osmolality and hyponatremia: Secondary | ICD-10-CM | POA: Diagnosis not present

## 2017-09-15 DIAGNOSIS — J189 Pneumonia, unspecified organism: Secondary | ICD-10-CM | POA: Diagnosis not present

## 2017-09-15 LAB — COMPREHENSIVE METABOLIC PANEL
ALBUMIN: 2.9 g/dL — AB (ref 3.5–5.0)
ALT: 15 U/L — AB (ref 17–63)
AST: 26 U/L (ref 15–41)
Alkaline Phosphatase: 60 U/L (ref 38–126)
Anion gap: 6 (ref 5–15)
BUN: 16 mg/dL (ref 6–20)
CHLORIDE: 103 mmol/L (ref 101–111)
CO2: 27 mmol/L (ref 22–32)
Calcium: 8 mg/dL — ABNORMAL LOW (ref 8.9–10.3)
Creatinine, Ser: 1.64 mg/dL — ABNORMAL HIGH (ref 0.61–1.24)
GFR, EST AFRICAN AMERICAN: 43 mL/min — AB (ref >60)
GFR, EST NON AFRICAN AMERICAN: 37 mL/min — AB (ref >60)
Glucose, Bld: 112 mg/dL — ABNORMAL HIGH (ref 65–99)
POTASSIUM: 4.2 mmol/L (ref 3.5–5.1)
Sodium: 136 mmol/L (ref 135–145)
Total Bilirubin: 1.3 mg/dL — ABNORMAL HIGH (ref 0.3–1.2)
Total Protein: 6.9 g/dL (ref 6.5–8.1)

## 2017-09-15 LAB — URINALYSIS, ROUTINE W REFLEX MICROSCOPIC
Bacteria, UA: NONE SEEN
Bilirubin Urine: NEGATIVE
GLUCOSE, UA: NEGATIVE mg/dL
Ketones, ur: NEGATIVE mg/dL
Leukocytes, UA: NEGATIVE
NITRITE: NEGATIVE
PH: 5 (ref 5.0–8.0)
Protein, ur: 30 mg/dL — AB
Specific Gravity, Urine: 1.014 (ref 1.005–1.030)

## 2017-09-15 LAB — GLUCOSE, CAPILLARY
GLUCOSE-CAPILLARY: 98 mg/dL (ref 65–99)
GLUCOSE-CAPILLARY: 160 mg/dL — AB (ref 65–99)
Glucose-Capillary: 129 mg/dL — ABNORMAL HIGH (ref 65–99)
Glucose-Capillary: 136 mg/dL — ABNORMAL HIGH (ref 65–99)

## 2017-09-15 LAB — TROPONIN I
Troponin I: <0.03 ng/mL (ref <0.03)
Troponin I: <0.03 ng/mL (ref <0.03)

## 2017-09-15 LAB — CBC
HEMATOCRIT: 34 % — AB (ref 39.0–52.0)
HEMOGLOBIN: 11.1 g/dL — AB (ref 13.0–17.0)
MCH: 30.2 pg (ref 26.0–34.0)
MCHC: 32.6 g/dL (ref 30.0–36.0)
MCV: 92.6 fL (ref 78.0–100.0)
Platelets: 160 10*3/uL (ref 150–400)
RBC: 3.67 MIL/uL — AB (ref 4.22–5.81)
RDW: 14 % (ref 11.5–15.5)
WBC: 7.3 10*3/uL (ref 4.0–10.5)

## 2017-09-15 LAB — TSH: TSH: 0.295 u[IU]/mL — AB (ref 0.350–4.500)

## 2017-09-15 LAB — MRSA PCR SCREENING: MRSA BY PCR: NEGATIVE

## 2017-09-15 LAB — STREP PNEUMONIAE URINARY ANTIGEN: Strep Pneumo Urinary Antigen: NEGATIVE

## 2017-09-15 MED ORDER — ENOXAPARIN SODIUM 60 MG/0.6ML ~~LOC~~ SOLN
60.0000 mg | SUBCUTANEOUS | Status: DC
  Administered 2017-09-16 – 2017-09-17 (×2): 60 mg via SUBCUTANEOUS
  Filled 2017-09-15 (×3): qty 0.6

## 2017-09-15 MED ORDER — GUAIFENESIN ER 600 MG PO TB12
600.0000 mg | ORAL_TABLET | Freq: Two times a day (BID) | ORAL | Status: DC
  Administered 2017-09-15 – 2017-09-19 (×9): 600 mg via ORAL
  Filled 2017-09-15 (×9): qty 1

## 2017-09-15 MED ORDER — GUAIFENESIN 100 MG/5ML PO SOLN
5.0000 mL | ORAL | Status: DC | PRN
  Administered 2017-09-18 (×2): 100 mg via ORAL
  Filled 2017-09-15 (×2): qty 5

## 2017-09-15 MED ORDER — LEVOFLOXACIN IN D5W 500 MG/100ML IV SOLN: 500.0000 mg | INTRAVENOUS | Status: DC

## 2017-09-15 NOTE — Progress Notes (Addendum)
[facility] PROGRESS NOTE  Samuel Ryan DGL:[id number] DOB: 1931-03-07 DOA: 09/14/2017  PCP: [name], MD  Brief History/Interval Summary: 83 year old African-American male with a past medical history of dementia who lives with her son presented with a few day history of cough, shortness of breath, fever. Patient was diagnosed with having a pneumonia. He was hospitalized for further management.  Reason for Visit: community-acquired pneumonia  Consultants: none  Procedures: none  Antibiotics: levofloxacin  Subjective/Interval History: Patient mild confusion. However, he states that he is feeling some better. Continues to cough. Denies any chest pain.  ROS: denies any nausea or vomiting  Objective:  Vital Signs  Vitals:   09/14/17 2230 09/14/17 2313 09/15/17 0500 09/15/17 0536  BP:  135/67  134/82  Pulse: 99 94  87  Resp: (!) 28 20  16   Temp:  (!) 100.8 F (38.2 C)  98.1 F (36.7 C)  TempSrc:  Oral  Oral  SpO2: 94%   100%  Weight:  118.2 kg (260 lb 8 oz) 121.5 kg (267 lb 12.8 oz)   Height:  5\' 10"  (1.778 m)      Intake/Output Summary (Last 24 hours) at 09/15/17 1124 Last data filed at 09/15/17 5188  Gross per 24 hour  Intake             1400 ml  Output              300 ml  Net             1100 ml   Filed Weights   09/14/17 1444 09/14/17 2313 09/15/17 0500  Weight: 120.7 kg (266 lb) 118.2 kg (260 lb 8 oz) 121.5 kg (267 lb 12.8 oz)    General appearance: alert, cooperative, appears stated age, distracted and no distress Head: Normocephalic, without obvious abnormality, atraumatic Resp: coarse breath sounds bilaterally. Crackles at the bases, left more than right. No wheezing. Few rhonchi. Mildly tachypneic. Cardio: regular rate and rhythm, S1, S2 normal, no murmur, click, rub or gallop GI: soft, non-tender; bowel sounds normal; no masses,  no organomegaly Extremities: extremities normal, atraumatic, no cyanosis or edema Neurologic: awake  alert. Mildly disoriented. No obvious focal neurological deficits.  Lab Results:  Data Reviewed: I have personally reviewed following labs and imaging studies  CBC:  Recent Labs Lab 09/14/17 1546 09/15/17 0539  WBC 8.2 7.3  NEUTROABS 6.4  --   HGB 12.0* 11.1*  HCT 36.2* 34.0*  MCV 92.1 92.6  PLT 166 416    Basic Metabolic Panel:  Recent Labs Lab 09/14/17 1546 09/15/17 0539  NA 133* 136  K 4.6 4.2  CL 103 103  CO2 22 27  GLUCOSE 253* 112*  BUN 18 16  CREATININE 1.75* 1.64*  CALCIUM 8.4* 8.0*    GFR: Estimated Creatinine Clearance: 43.8 mL/min (A) (by C-G formula based on SCr of 1.64 mg/dL (H)).  Liver Function Tests:  Recent Labs Lab 09/14/17 1546 09/15/17 0539  AST 26 26  ALT 17 15*  ALKPHOS 70 60  BILITOT 0.9 1.3*  PROT 7.6 6.9  ALBUMIN 3.3* 2.9*    Cardiac Enzymes:  Recent Labs Lab 09/14/17 1921 09/14/17 2216 09/15/17 0542  TROPONINI <0.03 <0.03 <0.03   CBG:  Recent Labs Lab 09/14/17 1840 09/15/17 0733  GLUCAP 182* 98    Thyroid Function Tests:  Recent Labs  09/14/17 2216  TSH 0.295*    Recent Results (from the past 240 hour(s))  Blood culture (routine x 2)  Status: None (Preliminary result)   Collection Time: 09/14/17  7:14 PM  Result Value Ref Range Status   Specimen Description LEFT ANTECUBITAL  Final   Special Requests   Final    AEROBIC BOTTLE ONLY Blood Culture results may not be optimal due to an excessive volume of blood received in culture bottles   Culture NO GROWTH < 12 HOURS  Final   Report Status PENDING  Incomplete  Blood culture (routine x 2)     Status: None (Preliminary result)   Collection Time: 09/14/17  7:21 PM  Result Value Ref Range Status   Specimen Description BLOOD LEFT ARM  Final   Special Requests   Final    AEROBIC BOTTLE ONLY Blood Culture results may not be optimal due to an excessive volume of blood received in culture bottles   Culture NO GROWTH < 12 HOURS  Final   Report Status PENDING   Incomplete      Radiology Studies: Dg Chest 2 View  Result Date: 09/14/2017 CLINICAL DATA:  Productive cough and congestion for 1 week EXAM: CHEST  2 VIEW COMPARISON:  02/21/2017 FINDINGS: Chronic cardiomegaly is again identified and stable. Mild vascular congestion is noted. Additionally some bibasilar scarring is noted. Increased density is noted in the left base likely related to acute on chronic infiltrate. Small left pleural effusion is noted. IMPRESSION: Chronic cardiomegaly. Mild vascular congestion. Acute on chronic infiltrate in the left base. Electronically Signed   By: Inez Catalina M.D.   On: 09/14/2017 15:09     Medications:  Scheduled: . donepezil  10 mg Oral QHS  . enoxaparin (LOVENOX) injection  40 mg Subcutaneous Q24H  . ferrous sulfate  325 mg Oral BID  . furosemide  20 mg Oral Daily  . guaiFENesin  600 mg Oral BID  . insulin aspart  0-5 Units Subcutaneous QHS  . insulin aspart  0-9 Units Subcutaneous TID WC  . loratadine  10 mg Oral q morning - 10a  . pravastatin  40 mg Oral q1800  . sertraline  50 mg Oral Daily  . tamsulosin  0.4 mg Oral Daily   Continuous: . 0.9 % NaCl with KCl 20 mEq / L 75 mL/hr at 09/15/17 0508  . [START ON 09/17/2017] levofloxacin (LEVAQUIN) IV     UXN:ATFTDDUKGURKY **OR** acetaminophen, fluticasone, guaiFENesin, hydroxypropyl methylcellulose / hypromellose, traMADol  Assessment/Plan:  Principal Problem:   CAP (community acquired pneumonia) Active Problems:   Anemia   CKD (chronic kidney disease) stage 3, GFR 30-59 ml/min (HCC)   Hyponatremia     Community-acquired pneumonia No clear evidence for sepsis. Patient did have fever but WBC was normal. Heart rate was normal. Patient started on levofloxacin which will be continued. Follow-up on blood cultures. Mucinex.  Mild hyponatremia. Corrected with IV fluids.  Chronic kidney disease stage IV Renal function is close to baseline. Continue to monitor urine output.  History of  dementia. Seems to be stable. Could be at baseline. Reorient daily.  Diabetes mellitus type 2. Continue to monitor her CBGs. Sliding scale coverage.  Normocytic anemia. No evidence for overt bleeding. Hemoglobin is stable. Continue to monitor.  Low TSH Check free T4 in the morning. No known history of hypo-or hyperthyroidism. Could be sick euthyroid.   DVT Prophylaxis: Lovenox    Code Status: Full code  Family Communication: discussed with the patient  Disposition Plan: management as outlined above    LOS: 1 day   [facility]  [facility] Pager [phone] 09/15/2017, 11:24 AM  If 7PM-7AM, please contact night-coverage at [email or web address], password [email or web address]   

## 2017-09-15 NOTE — Progress Notes (Signed)
Pharmacy Antibiotic Note  Samuel Ryan is a 83 y.o. male admitted on 09/14/2017 with pneumonia.  Pharmacy has been consulted for [facility] dosing.  Plan: Levaquin 750mg  x 1 then 500mg  IV q48hrs (renally adjusted) Monitor labs, progress, c/s  Height: 5\' 10"  (177.8 cm) Weight: 267 lb 12.8 oz (121.5 kg) IBW/kg (Calculated) : 73  Temp (24hrs), Avg:100.2 F (37.9 C), Min:98.1 F (36.7 C), Max:103 F (39.4 C)   Recent Labs Lab 09/14/17 1546 09/14/17 1921 09/14/17 2216 09/15/17 0539  WBC 8.2  --   --  7.3  CREATININE 1.75*  --   --  1.64*  LATICACIDVEN  --  1.4 1.0  --     Estimated Creatinine Clearance: 43.8 mL/min (A) (by C-G formula based on SCr of 1.64 mg/dL (H)).    Allergies  Allergen Reactions  . Ace Inhibitors Cough  . Vancomycin Swelling and Other (See Comments)    Reaction:  Facial swelling  . Zosyn [Piperacillin Sod-Tazobactam So] Swelling and Other (See Comments)    Reaction:  Facial swelling   Antimicrobials this admission: Levaquin 10/18 >>   Dose adjustments this admission:  Microbiology results:  BCx: pending  UCx: pending   Sputum: pending   MRSA PCR: pending  Thank you for allowing pharmacy to be a part of this patient's care.  Hart Robinsons A 09/15/2017 12:22 PM

## 2017-09-15 NOTE — Evaluation (Signed)
Physical Therapy Evaluation Patient Details Name: Samuel Ryan MRN: [id number] DOB: [birth year] Today's Date: 09/15/2017   History of Present Illness  Samuel Ryan  is a 83 y.o. male, w dementia, apparently presents with cough, w yellow sputum starting yesterday.  Couldn't get in to see pcp because elevator broken and sent to ED.     Clinical Impression  Patient limited for functional mobility and gait as stated below secondary to BLE weakness, fatigue and poor standing balance.  Patient had near fall landing on bed due to BLE giving way.  Patient will benefit from continued physical therapy in hospital and recommended venue below to increase strength, balance, endurance for safe ADLs and gait.    Follow Up Recommendations SNF;Supervision/Assistance - 24 hour    Equipment Recommendations  None recommended by PT    Recommendations for Other Services       Precautions / Restrictions Precautions Precautions: Fall Restrictions Weight Bearing Restrictions: No      Mobility  Bed Mobility Overal bed mobility: Needs Assistance Bed Mobility: Supine to Sit;Sit to Supine     Supine to sit: Mod assist Sit to supine: Mod assist      Transfers Overall transfer level: Needs assistance Equipment used: Rolling walker (2 wheeled) Transfers: Sit to/from [facility] Sit to Stand: Mod assist Stand pivot transfers: Mod assist       General transfer comment: slow labored movement with much verbal cueing for proper hand/foot placement  Ambulation/Gait Ambulation/Gait assistance: Mod assist Ambulation Distance (Feet): 3 Feet Assistive device: Rolling walker (2 wheeled) Gait Pattern/deviations: Decreased step length - right;Decreased step length - left;Decreased stride length   Gait velocity interpretation: Below normal speed for age/gender General Gait Details: Patient limited to a few steps at bedside due to fatigue after sitting up in chair since AM.  Stairs             Wheelchair Mobility    Modified Rankin (Stroke Patients Only)       Balance Overall balance assessment: Needs assistance Sitting-balance support: Feet supported;No upper extremity supported Sitting balance-Leahy Scale: Fair     Standing balance support: Bilateral upper extremity supported;During functional activity Standing balance-Leahy Scale: Poor                               Pertinent Vitals/Pain Pain Assessment: No/denies pain    Home Living Family/patient expects to be discharged to:: Private residence Living Arrangements: Children Available Help at Discharge: Family Type of Home: House Home Access: Level entry     Home Layout: One level Home Equipment: Environmental consultant - 2 wheels;Bedside commode Additional Comments: all info per patient    Prior Function Level of Independence: Independent with assistive device(s)               Hand Dominance        Extremity/Trunk Assessment   Upper Extremity Assessment Upper Extremity Assessment: Generalized weakness    Lower Extremity Assessment Lower Extremity Assessment: Generalized weakness    Cervical / Trunk Assessment Cervical / Trunk Assessment: Normal  Communication   Communication: No difficulties  Cognition Arousal/Alertness: Awake/alert Behavior During Therapy: WFL for tasks assessed/performed Overall Cognitive Status: Within Functional Limits for tasks assessed                                        General Comments  Exercises     Assessment/Plan    PT Assessment Patient needs continued PT services  PT Problem List Decreased strength;Decreased activity tolerance;Decreased balance;Decreased mobility       PT Treatment Interventions Gait training;Functional mobility training;Therapeutic activities;Therapeutic exercise;Patient/family education    PT Goals (Current goals can be found in the Care Plan section)  Acute Rehab PT Goals Patient Stated Goal:  return home able to walk PT Goal Formulation: With patient Time For Goal Achievement: 09/28/17 Potential to Achieve Goals: Good    Frequency Min 3X/week   Barriers to discharge        Co-evaluation               AM-PAC PT "6 Clicks" Daily Activity  Outcome Measure   Difficulty moving from lying on back to sitting on the side of the bed? : A Little Difficulty sitting down on and standing up from a chair with arms (e.g., wheelchair, bedside commode, etc,.)?: A Lot Help needed moving to and from a bed to chair (including a wheelchair)?: A Lot Help needed walking in hospital room?: A Lot Help needed climbing 3-5 steps with a railing? : Total 6 Click Score: 10    End of Session Equipment Utilized During Treatment: Gait belt Activity Tolerance: Patient limited by fatigue Patient left: in bed;with call bell/phone within reach;with bed alarm set Nurse Communication: Mobility status PT Visit Diagnosis: Unsteadiness on feet (R26.81);Other abnormalities of gait and mobility (R26.89);Muscle weakness (generalized) (M62.81)    Time: 7654-6503 PT Time Calculation (min) (ACUTE ONLY): 36 min   Charges:   PT Evaluation $PT Eval Moderate Complexity: 1 Mod PT Treatments $Therapeutic Activity: 23-37 mins   PT G Codes:   PT G-Codes **NOT FOR INPATIENT CLASS** Functional Assessment Tool Used: AM-PAC 6 Clicks Basic Mobility Functional Limitation: Mobility: Walking and moving around Mobility: Walking and Moving Around Current Status (T4656): At least 60 percent but less than 80 percent impaired, limited or restricted Mobility: Walking and Moving Around Goal Status [phone]): At least 60 percent but less than 80 percent impaired, limited or restricted Mobility: Walking and Moving Around Discharge Status [phone]): At least 60 percent but less than 80 percent impaired, limited or restricted    4:05 PM, 09/15/17 [name], MPT Physical Therapist with [facility] [address]  [phone] office [phone] mobile phone

## 2017-09-16 DIAGNOSIS — N183 Chronic kidney disease, stage 3 (moderate): Secondary | ICD-10-CM | POA: Diagnosis not present

## 2017-09-16 DIAGNOSIS — E119 Type 2 diabetes mellitus without complications: Secondary | ICD-10-CM

## 2017-09-16 DIAGNOSIS — R509 Fever, unspecified: Secondary | ICD-10-CM | POA: Diagnosis not present

## 2017-09-16 DIAGNOSIS — D649 Anemia, unspecified: Secondary | ICD-10-CM | POA: Diagnosis not present

## 2017-09-16 DIAGNOSIS — J189 Pneumonia, unspecified organism: Secondary | ICD-10-CM | POA: Diagnosis not present

## 2017-09-16 LAB — CBC
HEMATOCRIT: 32.9 % — AB (ref 39.0–52.0)
Hemoglobin: 10.9 g/dL — ABNORMAL LOW (ref 13.0–17.0)
MCH: 30.5 pg (ref 26.0–34.0)
MCHC: 33.1 g/dL (ref 30.0–36.0)
MCV: 92.2 fL (ref 78.0–100.0)
PLATELETS: 157 10*3/uL (ref 150–400)
RBC: 3.57 MIL/uL — AB (ref 4.22–5.81)
RDW: 14 % (ref 11.5–15.5)
WBC: 4.2 10*3/uL (ref 4.0–10.5)

## 2017-09-16 LAB — T4, FREE: Free T4: 0.99 ng/dL (ref 0.61–1.12)

## 2017-09-16 LAB — BASIC METABOLIC PANEL
ANION GAP: 9 (ref 5–15)
BUN: 19 mg/dL (ref 6–20)
CALCIUM: 8.1 mg/dL — AB (ref 8.9–10.3)
CO2: 23 mmol/L (ref 22–32)
Chloride: 103 mmol/L (ref 101–111)
Creatinine, Ser: 1.73 mg/dL — ABNORMAL HIGH (ref 0.61–1.24)
GFR, EST AFRICAN AMERICAN: 40 mL/min — AB (ref >60)
GFR, EST NON AFRICAN AMERICAN: 34 mL/min — AB (ref >60)
GLUCOSE: 137 mg/dL — AB (ref 65–99)
POTASSIUM: 4.2 mmol/L (ref 3.5–5.1)
Sodium: 135 mmol/L (ref 135–145)

## 2017-09-16 LAB — GLUCOSE, CAPILLARY
GLUCOSE-CAPILLARY: 125 mg/dL — AB (ref 65–99)
GLUCOSE-CAPILLARY: 230 mg/dL — AB (ref 65–99)
GLUCOSE-CAPILLARY: 316 mg/dL — AB (ref 65–99)
Glucose-Capillary: 332 mg/dL — ABNORMAL HIGH (ref 65–99)

## 2017-09-16 LAB — URINE CULTURE: CULTURE: NO GROWTH

## 2017-09-16 MED ORDER — POLYETHYLENE GLYCOL 3350 17 G PO PACK
17.0000 g | PACK | Freq: Every day | ORAL | Status: DC | PRN
  Administered 2017-09-16: 17 g via ORAL
  Filled 2017-09-16: qty 1

## 2017-09-16 MED ORDER — SENNA 8.6 MG PO TABS
1.0000 | ORAL_TABLET | Freq: Every day | ORAL | Status: DC
  Administered 2017-09-16 – 2017-09-19 (×4): 8.6 mg via ORAL
  Filled 2017-09-16 (×4): qty 1

## 2017-09-16 MED ORDER — FERROUS SULFATE 325 (65 FE) MG PO TABS
325.0000 mg | ORAL_TABLET | Freq: Every day | ORAL | Status: DC
  Administered 2017-09-17 – 2017-09-19 (×3): 325 mg via ORAL
  Filled 2017-09-16 (×3): qty 1

## 2017-09-16 MED ORDER — IPRATROPIUM-ALBUTEROL 0.5-2.5 (3) MG/3ML IN SOLN
3.0000 mL | Freq: Three times a day (TID) | RESPIRATORY_TRACT | Status: DC
Start: 2017-09-16 — End: 2017-09-17
  Administered 2017-09-16 (×2): 3 mL via RESPIRATORY_TRACT
  Filled 2017-09-16 (×2): qty 3

## 2017-09-16 MED ORDER — INSULIN GLARGINE 100 UNIT/ML ~~LOC~~ SOLN
10.0000 [IU] | Freq: Every day | SUBCUTANEOUS | Status: DC
  Administered 2017-09-16 – 2017-09-17 (×2): 10 [IU] via SUBCUTANEOUS
  Filled 2017-09-16 (×4): qty 0.1

## 2017-09-16 MED ORDER — PREDNISONE 20 MG PO TABS
60.0000 mg | ORAL_TABLET | Freq: Every day | ORAL | Status: DC
  Administered 2017-09-16 – 2017-09-18 (×3): 60 mg via ORAL
  Filled 2017-09-16 (×3): qty 3

## 2017-09-16 NOTE — Care Management Important Message (Signed)
Important Message  Patient Details  Name: Samuel Ryan MRN: [id number] Date of Birth: 10-Jul-1931   Medicare Important Message Given:  Yes    Sherald Barge, RN 09/16/2017, 3:26 PM

## 2017-09-16 NOTE — Progress Notes (Addendum)
[facility] PROGRESS NOTE  Samuel Ryan LFY:[id number] DOB: 1930-11-30 DOA: 09/14/2017  PCP: [name], MD  Brief History/Interval Summary: 83 year old African-American male with a past medical history of dementia who lives with her son presented with a few day history of cough, shortness of breath, fever. Patient was diagnosed with having a pneumonia. He was hospitalized for further management.  Reason for Visit: community-acquired pneumonia  Consultants: none  Procedures: none  Antibiotics: levofloxacin  Subjective/Interval History: Patient states that he continues to cough with yellowish expectoration. However, shortness of breath has improved. He denies any chest pain.  ROS: denies any nausea or vomiting.  Objective:  Vital Signs  Vitals:   09/15/17 2255 09/16/17 0300 09/16/17 0400 09/16/17 0500  BP: (!) 154/80 (!) 160/79    Pulse: 85 82    Resp: 15     Temp: 99.5 F (37.5 C) 99.6 F (37.6 C)    TempSrc: Oral Oral    SpO2: 94%  97%   Weight:    120.7 kg (266 lb 1.6 oz)  Height:        Intake/Output Summary (Last 24 hours) at 09/16/17 0809 Last data filed at 09/16/17 0300  Gross per 24 hour  Intake           918.75 ml  Output             1600 ml  Net          -681.25 ml   Filed Weights   09/14/17 2313 09/15/17 0500 09/16/17 0500  Weight: 118.2 kg (260 lb 8 oz) 121.5 kg (267 lb 12.8 oz) 120.7 kg (266 lb 1.6 oz)    General appearance: awake alert. In no distress. Resp: continues to have coarse breath sounds bilaterally. Some wheezing is heard today. No rhonchi. Mildly tachypneic. Crackles at the bases, left more than right. Cardio: S1, S2 is normal, regular. No history of stroke. No rubs, murmurs or bruits GI: abdomen is soft. Nontender, nondistended. Bowel sounds are present. No masses or organomegaly Extremities: no edema Neurologic: awake and alert. No focal neurological deficits  Lab Results:  Data Reviewed: I have personally  reviewed following labs and imaging studies  CBC:  Recent Labs Lab 09/14/17 1546 09/15/17 0539 09/16/17 0629  WBC 8.2 7.3 4.2  NEUTROABS 6.4  --   --   HGB 12.0* 11.1* 10.9*  HCT 36.2* 34.0* 32.9*  MCV 92.1 92.6 92.2  PLT 166 160 852    Basic Metabolic Panel:  Recent Labs Lab 09/14/17 1546 09/15/17 0539 09/16/17 0629  NA 133* 136 135  K 4.6 4.2 4.2  CL 103 103 103  CO2 22 27 23   GLUCOSE 253* 112* 137*  BUN 18 16 19   CREATININE 1.75* 1.64* 1.73*  CALCIUM 8.4* 8.0* 8.1*    GFR: Estimated Creatinine Clearance: 41.4 mL/min (A) (by C-G formula based on SCr of 1.73 mg/dL (H)).  Liver Function Tests:  Recent Labs Lab 09/14/17 1546 09/15/17 0539  AST 26 26  ALT 17 15*  ALKPHOS 70 60  BILITOT 0.9 1.3*  PROT 7.6 6.9  ALBUMIN 3.3* 2.9*    Cardiac Enzymes:  Recent Labs Lab 09/14/17 1921 09/14/17 2216 09/15/17 0542 09/15/17 1143  TROPONINI <0.03 <0.03 <0.03 <0.03   CBG:  Recent Labs Lab 09/14/17 1840 09/15/17 0733 09/15/17 1128 09/15/17 1637 09/15/17 2252  GLUCAP 182* 98 160* 129* 136*    Thyroid Function Tests:  Recent Labs  09/14/17 2216  TSH 0.295*    Recent  Results (from the past 240 hour(s))  Blood culture (routine x 2)     Status: None (Preliminary result)   Collection Time: 09/14/17  7:14 PM  Result Value Ref Range Status   Specimen Description LEFT ANTECUBITAL  Final   Special Requests   Final    AEROBIC BOTTLE ONLY Blood Culture results may not be optimal due to an excessive volume of blood received in culture bottles   Culture NO GROWTH 2 DAYS  Final   Report Status PENDING  Incomplete  Blood culture (routine x 2)     Status: None (Preliminary result)   Collection Time: 09/14/17  7:21 PM  Result Value Ref Range Status   Specimen Description BLOOD LEFT ARM  Final   Special Requests   Final    AEROBIC BOTTLE ONLY Blood Culture results may not be optimal due to an excessive volume of blood received in culture bottles   Culture  NO GROWTH 2 DAYS  Final   Report Status PENDING  Incomplete  MRSA PCR Screening     Status: None   Collection Time: 09/15/17 10:52 AM  Result Value Ref Range Status   MRSA by PCR NEGATIVE NEGATIVE Final    Comment:        The GeneXpert MRSA Assay (FDA approved for NASAL specimens only), is one component of a comprehensive MRSA colonization surveillance program. It is not intended to diagnose MRSA infection nor to guide or monitor treatment for MRSA infections.       Radiology Studies: Dg Chest 2 View  Result Date: 09/14/2017 CLINICAL DATA:  Productive cough and congestion for 1 week EXAM: CHEST  2 VIEW COMPARISON:  02/21/2017 FINDINGS: Chronic cardiomegaly is again identified and stable. Mild vascular congestion is noted. Additionally some bibasilar scarring is noted. Increased density is noted in the left base likely related to acute on chronic infiltrate. Small left pleural effusion is noted. IMPRESSION: Chronic cardiomegaly. Mild vascular congestion. Acute on chronic infiltrate in the left base. Electronically Signed   By: Inez Catalina M.D.   On: 09/14/2017 15:09     Medications:  Scheduled: . donepezil  10 mg Oral QHS  . enoxaparin (LOVENOX) injection  60 mg Subcutaneous Q24H  . ferrous sulfate  325 mg Oral BID  . furosemide  20 mg Oral Daily  . guaiFENesin  600 mg Oral BID  . insulin aspart  0-5 Units Subcutaneous QHS  . insulin aspart  0-9 Units Subcutaneous TID WC  . ipratropium-albuterol  3 mL Nebulization Q8H  . loratadine  10 mg Oral q morning - 10a  . pravastatin  40 mg Oral q1800  . predniSONE  60 mg Oral Q breakfast  . sertraline  50 mg Oral Daily  . tamsulosin  0.4 mg Oral Daily   Continuous: . [START ON 09/17/2017] levofloxacin (LEVAQUIN) IV     OEV:[id number] **OR** acetaminophen, fluticasone, guaiFENesin, hydroxypropyl methylcellulose / hypromellose, traMADol  Assessment/Plan:  Principal Problem:   CAP (community acquired pneumonia) Active  Problems:   Anemia   CKD (chronic kidney disease) stage 3, GFR 30-59 ml/min (HCC)   Hyponatremia     Community-acquired pneumonia No clear evidence for sepsis. Patient did have fever but WBC was normal. Heart rate was normal. Patient started on levofloxacin which will be continued. Blood cultures are negative so far. Mucinex. swallow evaluation. He has noted have wheezing today. He will be given nebulizer treatments and will be started on steroids.  Mild hyponatremia. Corrected with IV fluids.  Chronic kidney disease  stage IV Renal function is close to baseline. Continue to monitor urine output.  History of dementia. Seems to be stable. Reorient daily.  Diabetes mellitus type 2. Continue to monitor CBGs. CBGs are reasonably well controlled. Continue sliding scale coverage.Patient is noted to be on Lantus and glipizide at home. These have been held here. Resume Lantus as we anticipate his CBGs to climb due to the steroids.  Normocytic anemia. No evidence for overt bleeding. Hemoglobin is stable. Continue to monitor.  Low TSH No known history of hypo-or hyperthyroidism. Could be sick euthyroid. Free T4 is pending.   DVT Prophylaxis: Lovenox    Code Status: Full code  Family Communication: discussed with the patient  Disposition Plan: management as outlined above. PT and OT evaluation. May have to go for short-term rehabilitation.    LOS: 2 days   [place] [facility] Pager [phone] 09/16/2017, 8:09 AM  If 7PM-7AM, please contact night-coverage at [email or web address], password [facility]

## 2017-09-16 NOTE — Progress Notes (Signed)
Called by pt's RN to check on his breathing. Pt on room air with O2 saturation of 93%. Bilateral, equal clear breath sounds. Pt simply states that he doesn't feel well. Offered 2L O2 to pt. And he felt he would feel better with O2.

## 2017-09-16 NOTE — Care Management Note (Addendum)
Case Management Note  Patient Details  Name: Samuel Ryan MRN: [id number] Date of Birth: 1930-12-18  Subjective/Objective:                 Admitted with CAP. Pt is from home, lives with son. Pt has declined SNF, would like to DC home with [facility]. Pt son at bedside and agreeable to plan. They requests [facility] be [facility]. Aware HH has 48 hrs to make first visit. No DME needs. They communicates no other needs or concerns about DC.    Action/Plan: Vaughan Basta, [facility] rep aware of referral and will obtain pt info from chart. Anticipate DC home over weekend. Pt referred for emmi transition calls for pneumonia.   Expected Discharge Date:    09/18/2017              Expected Discharge Plan:  [place]  In-House Referral:  Clinical Social Work  Discharge planning Services  CM Consult  Post Acute Care Choice:  Home Health Choice offered to:  Patient, Adult Children  HH Arranged:  PT, RN [facility] Agency:  [place]  Status of Service:  Completed, signed off   Sherald Barge, RN 09/16/2017, 3:26 PM

## 2017-09-16 NOTE — Evaluation (Signed)
Clinical/Bedside Swallow Evaluation Patient Details  Name: Samuel Ryan MRN: [id number] Date of Birth: [birth year]  Today's Date: 09/16/2017 Time: SLP Start Time (ACUTE ONLY): 93 SLP Stop Time (ACUTE ONLY): 1358 SLP Time Calculation (min) (ACUTE ONLY): 23 min  Past Medical History:  Past Medical History:  Diagnosis Date  . Acute cholecystitis 11/2012   Drained percutaneously  . BPH (benign prostatic hypertrophy)   . Deep venous thrombosis (HCC)    Left leg, diagnosed 7/12  . Dementia    Needs supervision for safety per son - POA  . Diverticulitis   . Essential hypertension, benign   . History of pneumonia    Prior to 2012  . Incontinence of urine   . Mixed hyperlipidemia   . Osteoarthritis   . Pulmonary embolism ([place])    Diagnosed 7/12  . Type 2 diabetes mellitus ([place])    Past Surgical History:  Past Surgical History:  Procedure Laterality Date  . CARPAL TUNNEL RELEASE    . COLONOSCOPY  08/31/2012   Procedure: COLONOSCOPY;  Surgeon: Rogene Houston, MD;  [facility];  Service: Endoscopy;  Laterality: N/A;  830  . CYSTOSCOPY WITH INSERTION OF UROLIFT N/A 09/02/2016   Procedure: CYSTOSCOPY WITH INSERTION OF UROLIFT;  Surgeon: Cleon Gustin, MD;  Location: [facility];  Service: Urology;  Laterality: N/A;  . EYE SURGERY Bilateral    bilateral cataract  . Right total knee replacement  04/23/2011  . SPINE SURGERY  prior to 2012   Neck fusion  . UMBILICAL HERNIA REPAIR     umbilical   HPI:  83 year old African-American male with a past medical history of dementia who lives with her son presented with a few day history of cough, shortness of breath, fever. Patient was diagnosed with having a pneumonia. He was hospitalized for further management.   Assessment / Plan / Recommendation Clinical Impression  Clinical swallow evaluation completed with Pt sitting upright in chair. Pt's son was present for evaluation and provided some background  information. Pt's son reports occasional coughing during meals at home (Pt described when he became strangled eating fried fish at a restaurant before). Pt/son thought that Pt may have had a procedure in the past on his neck and chart review only reveals biopsy with ENT. Oral motor examination completed. Pt without asymmetry or weakness. He wears U/L dentures that fit well. Son reports that Pt typically drinks liquids with a straw at home. Pt self presented 240 cc water via cup/straw, a container of applesauce, and a package of graham crackers without overt s/sx of aspiration. Pt did have some dry coughing prior to po and again several minutes after feeding, however Pt reportedly coughing with PNA PTA. SLP reviewed aspiration and reflux precautions and recommendations for oral care. Recommend regular textures with thin liquids, po meds whole with water, and intermittent supervision. No further SLP services indicated at this time. SLP did discuss the option of completing MBSS as an outpatient if desired down the road.   SLP Visit Diagnosis: Dysphagia, oropharyngeal phase (R13.12)    Aspiration Risk  No limitations    Diet Recommendation Regular;Thin liquid   Liquid Administration via: Cup;Straw Medication Administration: Whole meds with liquid Supervision: Patient able to self feed Compensations: Slow rate;Small sips/bites Postural Changes: Seated upright at 90 degrees;Remain upright for at least 30 minutes after po intake    Other  Recommendations Oral Care Recommendations: Staff/trained caregiver to provide oral care Other Recommendations: Clarify dietary restrictions   Follow  up Recommendations None      Frequency and Duration            Prognosis Prognosis for Safe Diet Advancement: Good      Swallow Study   General Date of Onset: 09/14/17 83 year old African-American male with a past medical history of dementia who lives with her son presented with a few day history of cough,  shortness of breath, fever. Patient was diagnosed with having a pneumonia. He was hospitalized for further management. Type of Study: Bedside Swallow Evaluation Previous Swallow Assessment: None on record Diet Prior to this Study: Regular;Thin liquids Temperature Spikes Noted: No Respiratory Status: Room air History of Recent Intubation: No Behavior/Cognition: Alert;Cooperative;Pleasant mood Oral Cavity Assessment: Within Functional Limits Oral Care Completed by SLP: No Oral Cavity - Dentition: Dentures, top;Dentures, bottom Vision: Functional for self-feeding Self-Feeding Abilities: Able to feed self Patient Positioning: Upright in chair Baseline Vocal Quality: Normal Volitional Cough: Strong Volitional Swallow: Able to elicit    Oral/Motor/Sensory Function Overall Oral Motor/Sensory Function: Within functional limits   Ice Chips Ice chips: Within functional limits Presentation: Spoon   Thin Liquid Thin Liquid: Within functional limits Presentation: Self Fed;Cup;Straw    Nectar Thick Nectar Thick Liquid: Not tested   Honey Thick Honey Thick Liquid: Not tested   Puree Puree: Within functional limits Presentation: Self Fed;Spoon   Solid   Thank you,  Genene Churn, CCC-SLP [phone]    Solid: Within functional limits Presentation: [place] 09/16/2017,2:03 PM

## 2017-09-17 DIAGNOSIS — E119 Type 2 diabetes mellitus without complications: Secondary | ICD-10-CM | POA: Diagnosis not present

## 2017-09-17 DIAGNOSIS — N183 Chronic kidney disease, stage 3 (moderate): Secondary | ICD-10-CM | POA: Diagnosis not present

## 2017-09-17 DIAGNOSIS — D649 Anemia, unspecified: Secondary | ICD-10-CM | POA: Diagnosis not present

## 2017-09-17 DIAGNOSIS — R509 Fever, unspecified: Secondary | ICD-10-CM | POA: Diagnosis not present

## 2017-09-17 DIAGNOSIS — J189 Pneumonia, unspecified organism: Secondary | ICD-10-CM | POA: Diagnosis not present

## 2017-09-17 DIAGNOSIS — Z794 Long term (current) use of insulin: Secondary | ICD-10-CM

## 2017-09-17 LAB — BASIC METABOLIC PANEL
Anion gap: 9 (ref 5–15)
BUN: 25 mg/dL — AB (ref 6–20)
CHLORIDE: 104 mmol/L (ref 101–111)
CO2: 25 mmol/L (ref 22–32)
CREATININE: 1.7 mg/dL — AB (ref 0.61–1.24)
Calcium: 8.4 mg/dL — ABNORMAL LOW (ref 8.9–10.3)
GFR, EST AFRICAN AMERICAN: 41 mL/min — AB (ref >60)
GFR, EST NON AFRICAN AMERICAN: 35 mL/min — AB (ref >60)
Glucose, Bld: 192 mg/dL — ABNORMAL HIGH (ref 65–99)
Potassium: 4.1 mmol/L (ref 3.5–5.1)
SODIUM: 138 mmol/L (ref 135–145)

## 2017-09-17 LAB — LEGIONELLA PNEUMOPHILA SEROGP 1 UR AG: L. PNEUMOPHILA SEROGP 1 UR AG: NEGATIVE

## 2017-09-17 LAB — GLUCOSE, CAPILLARY
GLUCOSE-CAPILLARY: 195 mg/dL — AB (ref 65–99)
GLUCOSE-CAPILLARY: 306 mg/dL — AB (ref 65–99)
GLUCOSE-CAPILLARY: 278 mg/dL — AB (ref 65–99)
Glucose-Capillary: 259 mg/dL — ABNORMAL HIGH (ref 65–99)

## 2017-09-17 MED ORDER — INSULIN GLARGINE 100 UNIT/ML ~~LOC~~ SOLN
5.0000 [IU] | Freq: Once | SUBCUTANEOUS | Status: AC
  Administered 2017-09-17: 5 [IU] via SUBCUTANEOUS
  Filled 2017-09-17: qty 0.05

## 2017-09-17 MED ORDER — INSULIN GLARGINE 100 UNIT/ML ~~LOC~~ SOLN
15.0000 [IU] | Freq: Every day | SUBCUTANEOUS | Status: DC
  Administered 2017-09-18 – 2017-09-19 (×2): 15 [IU] via SUBCUTANEOUS
  Filled 2017-09-17 (×3): qty 0.15

## 2017-09-17 MED ORDER — IPRATROPIUM-ALBUTEROL 0.5-2.5 (3) MG/3ML IN SOLN
3.0000 mL | Freq: Three times a day (TID) | RESPIRATORY_TRACT | Status: DC
  Administered 2017-09-17 – 2017-09-18 (×4): 3 mL via RESPIRATORY_TRACT
  Filled 2017-09-17 (×4): qty 3

## 2017-09-17 MED ORDER — LEVOFLOXACIN 500 MG PO TABS
500.0000 mg | ORAL_TABLET | ORAL | Status: DC
  Administered 2017-09-17 – 2017-09-19 (×2): 500 mg via ORAL
  Filled 2017-09-17 (×3): qty 1

## 2017-09-17 NOTE — Progress Notes (Signed)
[facility] PROGRESS NOTE  CHENG DEC [id number]:[id number] DOB: 1931/05/13 DOA: 09/14/2017  PCP: [name], MD  Brief History/Interval Summary: 83 year old African-American male with a past medical history of dementia who lives with her son presented with a few day history of cough, shortness of breath, fever. Patient was diagnosed with having a pneumonia. He was hospitalized for further management.  Reason for Visit: community-acquired pneumonia  Consultants: none  Procedures: none  Antibiotics: levofloxacin  Subjective/Interval History: Patient states that he is feeling better. His cough is improving.  Shortness of breath has also improved.  Denies any chest pain  ROS: Denies any nausea or vomiting  Objective:  Vital Signs  Vitals:   09/16/17 2225 09/17/17 0453 09/17/17 0500 09/17/17 0732  BP:  129/74    Pulse:  71    Resp:      Temp:  98.2 F (36.8 C)    TempSrc:  Oral    SpO2: 93% 93%  91%  Weight:   121.3 kg (267 lb 6.7 oz)   Height:        Intake/Output Summary (Last 24 hours) at 09/17/17 0954 Last data filed at 09/16/17 2253  Gross per 24 hour  Intake              240 ml  Output              500 ml  Net             -260 ml   Filed Weights   09/15/17 0500 09/16/17 0500 09/17/17 0500  Weight: 121.5 kg (267 lb 12.8 oz) 120.7 kg (266 lb 1.6 oz) 121.3 kg (267 lb 6.7 oz)    General appearance: Awake and alert.  No distress. Resp: Air entry is improving.  Less wheezing today compared to yesterday.  Few crackles are present.  Effort is normal today. Cardio: S1-S2 is normal and regular.  No S3-S4.  No rubs murmurs of bruit GI: Abdomen remains soft.  Nontender nondistended.  Bowel sounds are present.  No masses organomegaly  Extremities: no edema Neurologic: Awake and alert.  In no distress.  Lab Results:  Data Reviewed: I have personally reviewed following labs and imaging studies  CBC:  Recent Labs Lab 09/14/17 1546 09/15/17 0539  09/16/17 0629  WBC 8.2 7.3 4.2  NEUTROABS 6.4  --   --   HGB 12.0* 11.1* 10.9*  HCT 36.2* 34.0* 32.9*  MCV 92.1 92.6 92.2  PLT 166 160 939    Basic Metabolic Panel:  Recent Labs Lab 09/14/17 1546 09/15/17 0539 09/16/17 0629 09/17/17 0747  NA 133* 136 135 138  K 4.6 4.2 4.2 4.1  CL 103 103 103 104  CO2 22 27 23 25   GLUCOSE 253* 112* 137* 192*  BUN 18 16 19  25*  CREATININE 1.75* 1.64* 1.73* 1.70*  CALCIUM 8.4* 8.0* 8.1* 8.4*    GFR: Estimated Creatinine Clearance: 42.2 mL/min (A) (by C-G formula based on SCr of 1.7 mg/dL (H)).  Liver Function Tests:  Recent Labs Lab 09/14/17 1546 09/15/17 0539  AST 26 26  ALT 17 15*  ALKPHOS 70 60  BILITOT 0.9 1.3*  PROT 7.6 6.9  ALBUMIN 3.3* 2.9*    Cardiac Enzymes:  Recent Labs Lab 09/14/17 1921 09/14/17 2216 09/15/17 0542 09/15/17 1143  TROPONINI <0.03 <0.03 <0.03 <0.03   CBG:  Recent Labs Lab 09/16/17 0813 09/16/17 1152 09/16/17 1628 09/16/17 2055 09/17/17 0752  GLUCAP 125* 230* 316* 332* 195*    Thyroid  Function Tests:  Recent Labs  09/14/17 2216 09/16/17 0629  TSH 0.295*  --   FREET4  --  0.99    Recent Results (from the past 240 hour(s))  Blood culture (routine x 2)     Status: None (Preliminary result)   Collection Time: 09/14/17  7:14 PM  Result Value Ref Range Status   Specimen Description LEFT ANTECUBITAL  Final   Special Requests   Final    AEROBIC BOTTLE ONLY Blood Culture results may not be optimal due to an excessive volume of blood received in culture bottles   Culture NO GROWTH 2 DAYS  Final   Report Status PENDING  Incomplete  Blood culture (routine x 2)     Status: None (Preliminary result)   Collection Time: 09/14/17  7:21 PM  Result Value Ref Range Status   Specimen Description BLOOD LEFT ARM  Final   Special Requests   Final    AEROBIC BOTTLE ONLY Blood Culture results may not be optimal due to an excessive volume of blood received in culture bottles   Culture NO GROWTH 2  DAYS  Final   Report Status PENDING  Incomplete  Urine culture     Status: None   Collection Time: 09/15/17  5:00 AM  Result Value Ref Range Status   Specimen Description URINE, RANDOM  Final   Special Requests NONE  Final   Culture   Final    NO GROWTH Performed at [facility], [address], [place], [place] [address]    Report Status 09/16/2017 FINAL  Final  MRSA PCR Screening     Status: None   Collection Time: 09/15/17 10:52 AM  Result Value Ref Range Status   MRSA by PCR NEGATIVE NEGATIVE Final    Comment:        The GeneXpert MRSA Assay (FDA approved for NASAL specimens only), is one component of a comprehensive MRSA colonization surveillance program. It is not intended to diagnose MRSA infection nor to guide or monitor treatment for MRSA infections.       Radiology Studies: No results found.   Medications:  Scheduled: . donepezil  10 mg Oral QHS  . enoxaparin (LOVENOX) injection  60 mg Subcutaneous Q24H  . ferrous sulfate  325 mg Oral Q breakfast  . furosemide  20 mg Oral Daily  . guaiFENesin  600 mg Oral BID  . insulin aspart  0-5 Units Subcutaneous QHS  . insulin aspart  0-9 Units Subcutaneous TID WC  . insulin glargine  10 Units Subcutaneous Daily  . ipratropium-albuterol  3 mL Nebulization Q8H  . levofloxacin  500 mg Oral Q48H  . loratadine  10 mg Oral q morning - 10a  . pravastatin  40 mg Oral q1800  . predniSONE  60 mg Oral Q breakfast  . senna  1 tablet Oral Daily  . sertraline  50 mg Oral Daily  . tamsulosin  0.4 mg Oral Daily   Continuous:  GNF:[id number] **OR** acetaminophen, fluticasone, guaiFENesin, hydroxypropyl methylcellulose / hypromellose, polyethylene glycol, traMADol  Assessment/Plan:  Principal Problem:   CAP (community acquired pneumonia) Active Problems:   Anemia   CKD (chronic kidney disease) stage 3, GFR 30-59 ml/min (HCC)   Hyponatremia     Community-acquired pneumonia Patient did not have any clear  evidence for sepsis at the time of admission.  Patient did have fever but WBC was normal. Heart rate was normal.  Patient was started on levofloxacin.  Blood cultures have been negative.  Clinically  patient is improving.  Change to oral antibiotics today.  He was also given steroids due to wheezing.  This is also improving.  Continue for now.  Seen by speech therapy.  No evidence for any dysphagia.    Mild hyponatremia. Corrected with IV fluids.  Chronic kidney disease stage IV Renal function is close to baseline. Continue to monitor urine output.  History of dementia. Seems to be stable. Reorient daily.  Diabetes mellitus type 2. CBGs are elevated due to steroids.  Continue Lantus and SSI.  Continue to monitor CBGs closely.  We will increase the dose of his Lantus.  Normocytic anemia. No evidence for overt bleeding. Hemoglobin is stable.   Low TSH No known history of hypo-or hyperthyroidism. Could be sick euthyroid. Free T4 is normal.   DVT Prophylaxis: Lovenox    Code Status: Full code  Family Communication: Discussed with the patient and his son Disposition Plan: Management as outlined above.  Patient refusing short-term at skilled nursing facility.  Agreeable to home health.  Anticipate discharge tomorrow.    LOS: 3 days   [facility] Pager [phone] 09/17/2017, 9:54 AM  If 7PM-7AM, please contact night-coverage at [email or web address], password [facility]

## 2017-09-17 NOTE — Plan of Care (Signed)
Problem: Activity: Goal: Ability to tolerate increased activity will improve Outcome: Progressing Patient tolerated ambulation with walker in room.  Problem: Health Behavior/Discharge Planning: Goal: Ability to manage health-related needs will improve Outcome: Progressing Patient able to verbalize needs appropriately   Problem: Respiratory: Goal: Respiratory status will improve Outcome: Progressing Patient continues to have productive cough

## 2017-09-18 ENCOUNTER — Inpatient Hospital Stay (HOSPITAL_COMMUNITY): Payer: Medicare Other

## 2017-09-18 DIAGNOSIS — S301XXA Contusion of abdominal wall, initial encounter: Secondary | ICD-10-CM | POA: Diagnosis not present

## 2017-09-18 DIAGNOSIS — J189 Pneumonia, unspecified organism: Secondary | ICD-10-CM | POA: Diagnosis not present

## 2017-09-18 DIAGNOSIS — R509 Fever, unspecified: Secondary | ICD-10-CM | POA: Diagnosis not present

## 2017-09-18 DIAGNOSIS — N183 Chronic kidney disease, stage 3 (moderate): Secondary | ICD-10-CM | POA: Diagnosis not present

## 2017-09-18 LAB — GLUCOSE, CAPILLARY
GLUCOSE-CAPILLARY: 126 mg/dL — AB (ref 65–99)
GLUCOSE-CAPILLARY: 237 mg/dL — AB (ref 65–99)
GLUCOSE-CAPILLARY: 381 mg/dL — AB (ref 65–99)
GLUCOSE-CAPILLARY: 378 mg/dL — AB (ref 65–99)

## 2017-09-18 LAB — COMPREHENSIVE METABOLIC PANEL
ALT: 22 U/L (ref 17–63)
AST: 34 U/L (ref 15–41)
Albumin: 3.1 g/dL — ABNORMAL LOW (ref 3.5–5.0)
Alkaline Phosphatase: 64 U/L (ref 38–126)
Anion gap: 9 (ref 5–15)
BUN: 23 mg/dL — AB (ref 6–20)
CHLORIDE: 102 mmol/L (ref 101–111)
CO2: 27 mmol/L (ref 22–32)
Calcium: 8.6 mg/dL — ABNORMAL LOW (ref 8.9–10.3)
Creatinine, Ser: 1.61 mg/dL — ABNORMAL HIGH (ref 0.61–1.24)
GFR, EST AFRICAN AMERICAN: 44 mL/min — AB (ref >60)
GFR, EST NON AFRICAN AMERICAN: 38 mL/min — AB (ref >60)
Glucose, Bld: 137 mg/dL — ABNORMAL HIGH (ref 65–99)
POTASSIUM: 4 mmol/L (ref 3.5–5.1)
Sodium: 138 mmol/L (ref 135–145)
Total Bilirubin: 0.9 mg/dL (ref 0.3–1.2)
Total Protein: 7.4 g/dL (ref 6.5–8.1)

## 2017-09-18 LAB — CBC
HCT: 33.8 % — ABNORMAL LOW (ref 39.0–52.0)
HEMOGLOBIN: 11.3 g/dL — AB (ref 13.0–17.0)
MCH: 30.3 pg (ref 26.0–34.0)
MCHC: 33.4 g/dL (ref 30.0–36.0)
MCV: 90.6 fL (ref 78.0–100.0)
PLATELETS: 184 10*3/uL (ref 150–400)
RBC: 3.73 MIL/uL — AB (ref 4.22–5.81)
RDW: 13.8 % (ref 11.5–15.5)
WBC: 6.2 10*3/uL (ref 4.0–10.5)

## 2017-09-18 MED ORDER — TRAMADOL HCL 50 MG PO TABS
50.0000 mg | ORAL_TABLET | Freq: Three times a day (TID) | ORAL | Status: DC | PRN
  Administered 2017-09-18 (×2): 50 mg via ORAL
  Filled 2017-09-18 (×2): qty 1

## 2017-09-18 MED ORDER — PREDNISONE 20 MG PO TABS
40.0000 mg | ORAL_TABLET | Freq: Every day | ORAL | Status: DC
  Administered 2017-09-19: 40 mg via ORAL
  Filled 2017-09-18: qty 2

## 2017-09-18 MED ORDER — IPRATROPIUM-ALBUTEROL 0.5-2.5 (3) MG/3ML IN SOLN
3.0000 mL | Freq: Three times a day (TID) | RESPIRATORY_TRACT | Status: DC
  Administered 2017-09-19: 3 mL via RESPIRATORY_TRACT
  Filled 2017-09-18 (×2): qty 3

## 2017-09-18 MED ORDER — OXYCODONE HCL 5 MG PO TABS
5.0000 mg | ORAL_TABLET | ORAL | Status: DC | PRN
  Administered 2017-09-18: 5 mg via ORAL
  Filled 2017-09-18: qty 1

## 2017-09-18 NOTE — Progress Notes (Signed)
[facility] PROGRESS NOTE  Samuel WITUCKI GHW:[id number] DOB: Apr 24, 1931 DOA: 09/14/2017  PCP: [name], MD  Brief History/Interval Summary: 83 year old African-American male with a past medical history of dementia who lives with her son presented with a few day history of cough, shortness of breath, fever. Patient was diagnosed with having a pneumonia. He was hospitalized for further management.  Reason for Visit: community-acquired pneumonia  Consultants: none  Procedures: none  Antibiotics: levofloxacin  Subjective/Interval History: Patient states that he had a rough night last night.  While he was pulling himself up on the bed he experienced severe pain in his right abdomen.  He feels like he pulled a muscle.  He denies any nausea vomiting or diarrhea.  Shortness of breath and cough is improved.    ROS: Denies any chest pain.  Objective:  Vital Signs  Vitals:   09/17/17 2017 09/17/17 2227 09/18/17 0424 09/18/17 0427  BP: (!) 146/71  (!) 145/60   Pulse: 76  65   Resp: 18  20   Temp: 98.3 F (36.8 C)  98 F (36.7 C)   TempSrc: Oral  Oral   SpO2: 95% 96% 100%   Weight:    116.3 kg (256 lb 6.3 oz)  Height:        Intake/Output Summary (Last 24 hours) at 09/18/17 0926 Last data filed at 09/18/17 0426  Gross per 24 hour  Intake                0 ml  Output             2100 ml  Net            -2100 ml   Filed Weights   09/16/17 0500 09/17/17 0500 09/18/17 0427  Weight: 120.7 kg (266 lb 1.6 oz) 121.3 kg (267 lb 6.7 oz) 116.3 kg (256 lb 6.3 oz)    General appearance: Awake and alert.  In no distress Resp: Improved effort.  Improved air entry.  Much less wheezing today.  Only few crackles appreciated.   Cardio: S1-S2 is normal regular.  No S3-S4.  No rubs murmurs or bruit GI: Abdomen is noted to be soft however there is area of firmness in the right side which is quite tender.  Feels like a mass going to is midline.  No rebound rigidity or guarding.   Bowel sounds are present.  No obvious organomegaly appreciated.   Extremities: no edema Neurologic: No focal neurological deficits  Lab Results:  Data Reviewed: I have personally reviewed following labs and imaging studies  CBC:  Recent Labs Lab 09/14/17 1546 09/15/17 0539 09/16/17 0629 09/18/17 0756  WBC 8.2 7.3 4.2 6.2  NEUTROABS 6.4  --   --   --   HGB 12.0* 11.1* 10.9* 11.3*  HCT 36.2* 34.0* 32.9* 33.8*  MCV 92.1 92.6 92.2 90.6  PLT 166 160 157 789    Basic Metabolic Panel:  Recent Labs Lab 09/14/17 1546 09/15/17 0539 09/16/17 0629 09/17/17 0747 09/18/17 0756  NA 133* 136 135 138 138  K 4.6 4.2 4.2 4.1 4.0  CL 103 103 103 104 102  CO2 22 27 23 25 27   GLUCOSE 253* 112* 137* 192* 137*  BUN 18 16 19  25* 23*  CREATININE 1.75* 1.64* 1.73* 1.70* 1.61*  CALCIUM 8.4* 8.0* 8.1* 8.4* 8.6*    GFR: Estimated Creatinine Clearance: 43.6 mL/min (A) (by C-G formula based on SCr of 1.61 mg/dL (H)).  Liver Function Tests:  Recent Labs  Lab 09/14/17 1546 09/15/17 0539 09/18/17 0756  AST 26 26 34  ALT 17 15* 22  ALKPHOS 70 60 64  BILITOT 0.9 1.3* 0.9  PROT 7.6 6.9 7.4  ALBUMIN 3.3* 2.9* 3.1*    Cardiac Enzymes:  Recent Labs Lab 09/14/17 1921 09/14/17 2216 09/15/17 0542 09/15/17 1143  TROPONINI <0.03 <0.03 <0.03 <0.03   CBG:  Recent Labs Lab 09/17/17 0752 09/17/17 1133 09/17/17 1711 09/17/17 2014 09/18/17 0757  GLUCAP 195* 259* 306* 278* 126*    Thyroid Function Tests:  Recent Labs  09/16/17 0629  FREET4 0.99    Recent Results (from the past 240 hour(s))  Blood culture (routine x 2)     Status: None (Preliminary result)   Collection Time: 09/14/17  7:14 PM  Result Value Ref Range Status   Specimen Description LEFT ANTECUBITAL  Final   Special Requests   Final    AEROBIC BOTTLE ONLY Blood Culture results may not be optimal due to an excessive volume of blood received in culture bottles   Culture NO GROWTH 3 DAYS  Final   Report Status  PENDING  Incomplete  Blood culture (routine x 2)     Status: None (Preliminary result)   Collection Time: 09/14/17  7:21 PM  Result Value Ref Range Status   Specimen Description BLOOD LEFT ARM  Final   Special Requests   Final    AEROBIC BOTTLE ONLY Blood Culture results may not be optimal due to an excessive volume of blood received in culture bottles   Culture NO GROWTH 3 DAYS  Final   Report Status PENDING  Incomplete  Urine culture     Status: None   Collection Time: 09/15/17  5:00 AM  Result Value Ref Range Status   Specimen Description URINE, RANDOM  Final   Special Requests NONE  Final   Culture   Final    NO GROWTH Performed at [facility], [place] [address] [place], [place] [address]    Report Status 09/16/2017 FINAL  Final  MRSA PCR Screening     Status: None   Collection Time: 09/15/17 10:52 AM  Result Value Ref Range Status   MRSA by PCR NEGATIVE NEGATIVE Final    Comment:        The GeneXpert MRSA Assay (FDA approved for NASAL specimens only), is one component of a comprehensive MRSA colonization surveillance program. It is not intended to diagnose MRSA infection nor to guide or monitor treatment for MRSA infections.       Radiology Studies: No results found.   Medications:  Scheduled: . donepezil  10 mg Oral QHS  . enoxaparin (LOVENOX) injection  60 mg Subcutaneous Q24H  . ferrous sulfate  325 mg Oral Q breakfast  . furosemide  20 mg Oral Daily  . guaiFENesin  600 mg Oral BID  . insulin aspart  0-5 Units Subcutaneous QHS  . insulin aspart  0-9 Units Subcutaneous TID WC  . insulin glargine  15 Units Subcutaneous Daily  . ipratropium-albuterol  3 mL Nebulization Q8H  . levofloxacin  500 mg Oral Q48H  . loratadine  10 mg Oral q morning - 10a  . pravastatin  40 mg Oral q1800  . predniSONE  60 mg Oral Q breakfast  . senna  1 tablet Oral Daily  . sertraline  50 mg Oral Daily  . tamsulosin  0.4 mg Oral Daily   Continuous:  [id number]  **OR** acetaminophen, fluticasone, guaiFENesin, hydroxypropyl methylcellulose / hypromellose, polyethylene glycol, traMADol  Assessment/Plan:  Principal Problem:   CAP (community acquired pneumonia) Active Problems:   Anemia   CKD (chronic kidney disease) stage 3, GFR 30-59 ml/min (HCC)   Hyponatremia     Acute right-sided abdominal pain It occurred while he was trying to pull himself up on the bed.  He could have bled into his abdominal wall musculature.  The area is quite tender to palpation.  Proceed with CT scan of the abdomen and pelvis.  Labs were repeated.  Hemoglobin noted to be stable.  ADDENDUM: CT scan does show a rectus sheath hematoma.  Stop Lovenox.  Place him on SCDs.  Findings discussed with patient's son.  Warm compresses.  Physical therapy to reevaluate.  Pain control.  Hold discharge for today.  Community-acquired pneumonia Patient did not have any clear evidence for sepsis at the time of admission.  Patient did have fever but WBC was normal. Heart rate was normal.  Patient was started on levofloxacin.  Blood cultures have been negative.  Patient has improved from respiratory standpoint.  He is tolerating oral antibiotics.  His wheezing has improved with steroids.  Seen by speech therapy.  No evidence for dysphagia/aspiration.    Mild hyponatremia. Corrected with IV fluids.  Chronic kidney disease stage IV Renal function is close to baseline. Continue to monitor urine output.  History of dementia. Seems to be stable. Reorient daily.  Diabetes mellitus type 2. CBGs are elevated due to steroids.  Continue Lantus and SSI.  CBG should improve as steroid is tapered down.  Normocytic anemia. No evidence for overt bleeding. Hemoglobin is stable.   Low TSH No known history of hypo-or hyperthyroidism. Could be sick euthyroid. Free T4 is normal.   DVT Prophylaxis: Lovenox    Code Status: Full code  Family Communication: Discussed with the patient and his  son Disposition Plan: Management as outlined above.  CT scan has been ordered.  Likely this abdominal issue will impair his mobility more.  He has not been seen by physical therapy since Thursday.  We may have to hold his discharge today.      LOS: 4 days   [facility] Pager [phone] 09/18/2017, 9:26 AM  If 7PM-7AM, please contact night-coverage at [email or web address], password [facility]

## 2017-09-19 DIAGNOSIS — S301XXA Contusion of abdominal wall, initial encounter: Secondary | ICD-10-CM | POA: Diagnosis not present

## 2017-09-19 DIAGNOSIS — E1165 Type 2 diabetes mellitus with hyperglycemia: Secondary | ICD-10-CM | POA: Diagnosis not present

## 2017-09-19 DIAGNOSIS — E1129 Type 2 diabetes mellitus with other diabetic kidney complication: Secondary | ICD-10-CM | POA: Diagnosis not present

## 2017-09-19 DIAGNOSIS — J189 Pneumonia, unspecified organism: Secondary | ICD-10-CM | POA: Diagnosis not present

## 2017-09-19 DIAGNOSIS — N183 Chronic kidney disease, stage 3 (moderate): Secondary | ICD-10-CM | POA: Diagnosis not present

## 2017-09-19 DIAGNOSIS — R509 Fever, unspecified: Secondary | ICD-10-CM | POA: Diagnosis not present

## 2017-09-19 LAB — CULTURE, BLOOD (ROUTINE X 2)
CULTURE: NO GROWTH
Culture: NO GROWTH

## 2017-09-19 LAB — BASIC METABOLIC PANEL
ANION GAP: 6 (ref 5–15)
BUN: 27 mg/dL — ABNORMAL HIGH (ref 6–20)
CALCIUM: 8.1 mg/dL — AB (ref 8.9–10.3)
CO2: 28 mmol/L (ref 22–32)
Chloride: 102 mmol/L (ref 101–111)
Creatinine, Ser: 1.62 mg/dL — ABNORMAL HIGH (ref 0.61–1.24)
GFR, EST AFRICAN AMERICAN: 43 mL/min — AB (ref >60)
GFR, EST NON AFRICAN AMERICAN: 37 mL/min — AB (ref >60)
GLUCOSE: 205 mg/dL — AB (ref 65–99)
Potassium: 4.5 mmol/L (ref 3.5–5.1)
Sodium: 136 mmol/L (ref 135–145)

## 2017-09-19 LAB — CBC
HCT: 32.9 % — ABNORMAL LOW (ref 39.0–52.0)
Hemoglobin: 10.7 g/dL — ABNORMAL LOW (ref 13.0–17.0)
MCH: 30 pg (ref 26.0–34.0)
MCHC: 32.5 g/dL (ref 30.0–36.0)
MCV: 92.2 fL (ref 78.0–100.0)
PLATELETS: 178 10*3/uL (ref 150–400)
RBC: 3.57 MIL/uL — ABNORMAL LOW (ref 4.22–5.81)
RDW: 13.9 % (ref 11.5–15.5)
WBC: 7.6 10*3/uL (ref 4.0–10.5)

## 2017-09-19 LAB — GLUCOSE, CAPILLARY
Glucose-Capillary: 166 mg/dL — ABNORMAL HIGH (ref 65–99)
Glucose-Capillary: 193 mg/dL — ABNORMAL HIGH (ref 65–99)

## 2017-09-19 MED ORDER — LEVOFLOXACIN 500 MG PO TABS: 500.0000 mg | ORAL_TABLET | ORAL | 0 refills | Status: DC

## 2017-09-19 MED ORDER — PREDNISONE 20 MG PO TABS: 40.0000 mg | ORAL_TABLET | Freq: Every day | ORAL | 0 refills | Status: DC

## 2017-09-19 NOTE — Care Management (Signed)
Pt discharging home today. Son at bedside. Pt has walked with PT. His son feels comfortable with him returning home with [facility] services. Vaughan Basta, [facility] rep, updated on DC date. Pt aware HH has 48 hrs to make first visit.

## 2017-09-19 NOTE — Discharge Summary (Signed)
Physician Discharge Summary  Samuel Ryan EHM:[id number] DOB: 04/03/1931 DOA: 09/14/2017  PCP: [name], MD  Admit date: 09/14/2017 Discharge date: 09/19/2017  Admitted From: home  Disposition:  home  Recommendations for Outpatient Follow-up:  1. Follow up with PCP in 1-2 weeks 2. Repeat TFTs in 1 month  Home Health:  PT/RN  Equipment/Devices:  None  Discharge Condition:  Stable, improved CODE STATUS:  Full code  Diet recommendation:  Diabetic diet  Brief/Interim Summary:  83 year old African-American male with a past medical history of dementia who lives with her son presented with a few day history of cough, shortness of breath, fever. Patient was diagnosed with having a pneumonia. He was hospitalized for further management.  Discharge Diagnoses:  Principal Problem:   CAP (community acquired pneumonia) Active Problems:   Anemia   CKD (chronic kidney disease) stage 3, GFR 30-59 ml/min (HCC)   Hyponatremia  Rectus sheath hematoma -  Continue warm compresses -  PT recommends home health PT  Community-acquired pneumonia Patient did not have any clear evidence for sepsis at the time of admission.  Patient did have fever but WBC was normal. Heart rate was normal.  Patient was started on levofloxacin.  Blood cultures have been negative.  Patient has improved from respiratory standpoint.  He is tolerating oral antibiotics.  His wheezing has improved with steroids.  Seen by speech therapy.  No evidence for dysphagia/aspiration.  1-2 days of additional antibiotics and steroids prescribed for home to complete a 7-day course.    Mild hyponatremia. Corrected with IV fluids.  Chronic kidney disease stage IV Renal function is close to baseline  Dementia, stable  Diabetes mellitus type 2. CBGs are elevated due to steroids.  Continue Lantus and glipizide as before.  CBG should improve as steroid is tapered down over the next few days  Normocytic anemia. No  evidence for overt bleeding. Hemoglobin is stable.   Low TSH No known history of hypo-or hyperthyroidism. Could be sick euthyroid. Free T4 is normal.   Discharge Instructions  Discharge Instructions    Call MD for:  difficulty breathing, headache or visual disturbances    Complete by:  As directed    Call MD for:  extreme fatigue    Complete by:  As directed    Call MD for:  hives    Complete by:  As directed    Call MD for:  persistant dizziness or light-headedness    Complete by:  As directed    Call MD for:  persistant nausea and vomiting    Complete by:  As directed    Call MD for:  severe uncontrolled pain    Complete by:  As directed    Diet Carb Modified    Complete by:  As directed    Increase activity slowly    Complete by:  As directed        Medication List    TAKE these medications   acetaminophen 500 MG tablet Commonly known as:  TYLENOL Take 1,000 mg by mouth every 6 (six) hours as needed for mild pain, moderate pain or headache.   carboxymethylcellul-glycerin 0.5-0.9 % ophthalmic solution Commonly known as:  REFRESH OPTIVE Place 1 drop into both eyes as needed (for dry eyes).   donepezil 10 MG tablet Commonly known as:  ARICEPT TAKE 1 TABLET AT BEDTIME   FEROSUL 325 (65 FE) MG tablet Generic drug:  ferrous sulfate TAKE 1 TABLET TWICE DAILY   fluticasone 50 MCG/ACT nasal spray Commonly  known as:  FLONASE Place 2 sprays into both nostrils daily as needed for rhinitis.   furosemide 20 MG tablet Commonly known as:  LASIX Take 1 tablet (20 mg total) by mouth daily.   glipiZIDE 10 MG 24 hr tablet Commonly known as:  GLUCOTROL XL TAKE TWO TABLETS EVERY MORNING AT BREAKFAST What changed:  how much to take  how to take this  when to take this  additional instructions   LANTUS SOLOSTAR 100 UNIT/ML Solostar Pen Generic drug:  Insulin Glargine INJECT 15 UNITS SQ ONCE DAILY AT 10PM What changed:  See the new instructions.   levofloxacin  500 MG tablet Commonly known as:  LEVAQUIN Take 1 tablet (500 mg total) by mouth every other day.   loratadine 10 MG tablet Commonly known as:  CLARITIN Take 10 mg by mouth every morning.   lovastatin 40 MG tablet Commonly known as:  MEVACOR TAKE 1 TABLET AT BEDTIME   MUCINEX FAST-MAX DM MAX 5-100 MG/5ML Liqd Generic drug:  Dextromethorphan-Guaifenesin Take 10-15 mLs by mouth 2 (two) times daily as needed (FOR COUGH AND CONGESTION).   Pen Needles 31G X 5 MM Misc 1 each by Does not apply route as directed. Use to inject lantus daily dx E11.65   predniSONE 20 MG tablet Commonly known as:  DELTASONE Take 2 tablets (40 mg total) by mouth daily with breakfast.   sertraline 50 MG tablet Commonly known as:  ZOLOFT TAKE 1 TABLET EVERY DAY   tamsulosin 0.4 MG Caps capsule Commonly known as:  FLOMAX TAKE 1 CAPSULE EVERY DAY   traMADol 50 MG tablet Commonly known as:  ULTRAM TAKE  (1)  TABLET  EVERY EIGHT HOURS AS NEEDED. What changed:  See the new instructions.   Trospium Chloride 60 MG Cp24 Take 60 mg by mouth daily.   TRUE METRIX BLOOD GLUCOSE TEST test strip Generic drug:  glucose blood TEST TWO TIMES DAILY   Vitamin D (Ergocalciferol) 50000 units Caps capsule Commonly known as:  DRISDOL TAKE 1 CAPSULE EVERY 7 (SEVEN) DAYS. What changed:  See the new instructions.      Follow-up Information    [facility].   Contact information: [address] [place] [address] [phone]        [name], MD. Schedule an appointment as soon as possible for a visit in 2 week(s).   Specialty:  Family Medicine Contact information: [address], [address] [place] [address] [phone]          Allergies  Allergen Reactions  . Ace Inhibitors Cough  . Vancomycin Swelling and Other (See Comments)    Reaction:  Facial swelling  . Zosyn [Piperacillin Sod-Tazobactam So] Swelling and Other (See Comments)    Reaction:   Facial swelling    Consultations: None   Procedures/Studies: Ct Abdomen Pelvis Wo Contrast  Result Date: 09/18/2017 CLINICAL DATA:  83 year old male with acute right abdominal pain for 1 day. EXAM: CT ABDOMEN AND PELVIS WITHOUT CONTRAST TECHNIQUE: Multidetector CT imaging of the abdomen and pelvis was performed following the standard protocol without IV contrast. COMPARISON:  11/15/2015 CT FINDINGS: Please note that parenchymal abnormalities may be missed without intravenous contrast. Lower chest: Bibasilar scarring/ fibrosis noted with mild left lower lobe cylindrical bronchiectasis. No acute abnormality noted. Hepatobiliary: The liver is unremarkable. Cholelithiasis identified without CT evidence of acute cholecystitis. No biliary dilatation. Pancreas: Unremarkable Spleen: Unremarkable Adrenals/Urinary Tract: The kidneys, adrenal glands and bladder are unchanged and unremarkable except  for stable left renal cyst. Stomach/Bowel: Stomach is within normal limits. No evidence of bowel wall thickening, distention, or inflammatory changes. Vascular/Lymphatic: Aortic atherosclerosis. No enlarged abdominal or pelvic lymph nodes. Reproductive: Prostate enlargement and seeds in the anterior prostate noted. Other: No ascites or pneumoperitoneum. Musculoskeletal: A right rectus sheath hematoma is noted measuring 4.5 x 10 x 7.5 cm. No intra- abdominal or intrapelvic hematoma is noted. No acute bony abnormalities are identified. Degenerative changes in the lumbar spine are again noted. IMPRESSION: 1. 4.5 x 10 x 7.5 cm right rectus sheath hematoma. 2. Cholelithiasis without CT evidence of acute cholecystitis 3. Bibasilar pulmonary scarring/fibrosis 4.  Aortic Atherosclerosis (ICD10-I70.0). Electronically Signed   By: Margarette Canada M.D.   On: 09/18/2017 09:45   Dg Chest 2 View  Result Date: 09/14/2017 CLINICAL DATA:  Productive cough and congestion for 1 week EXAM: CHEST  2 VIEW COMPARISON:  02/21/2017 FINDINGS:  Chronic cardiomegaly is again identified and stable. Mild vascular congestion is noted. Additionally some bibasilar scarring is noted. Increased density is noted in the left base likely related to acute on chronic infiltrate. Small left pleural effusion is noted. IMPRESSION: Chronic cardiomegaly. Mild vascular congestion. Acute on chronic infiltrate in the left base. Electronically Signed   By: Inez Catalina M.D.   On: 09/14/2017 15:09    Subjective: Feeling well.  Gets a little dizzy when he stands up.  Denies SOB, cough has improved dramatically since admission and no longer coughing up so much phlegm.  Eating well and abdominal wall pain has improved slightly.    Discharge Exam: Vitals:   09/19/17 0751 09/19/17 0755  BP:  139/87  Pulse:  65  Resp:    Temp:  98.1 F (36.7 C)  SpO2: 94% 95%   Vitals:   09/19/17 0443 09/19/17 0447 09/19/17 0751 09/19/17 0755  BP: (!) 145/76   139/87  Pulse: 62   65  Resp: 18     Temp: 97.8 F (36.6 C)   98.1 F (36.7 C)  TempSrc: Oral   Oral  SpO2: 96%  94% 95%  Weight:  114.5 kg (252 lb 6.8 oz)    Height:        General: Pt is alert, awake, not in acute distress, sitting in chair Cardiovascular: RRR, S1/S2 +, no rubs, no gallops Respiratory:  No wheezes, faint rales at the left base, otherwise clear, no rhonchi Abdominal: Soft, mildly distended and TTP on the right mid-abdomen, no rebound or guarding.  NABS Extremities: no edema, no cyanosis    The results of significant diagnostics from this hospitalization (including imaging, microbiology, ancillary and laboratory) are listed below for reference.     Microbiology: Recent Results (from the past 240 hour(s))  Blood culture (routine x 2)     Status: None   Collection Time: 09/14/17  7:14 PM  Result Value Ref Range Status   Specimen Description LEFT ANTECUBITAL  Final   Special Requests   Final    AEROBIC BOTTLE ONLY Blood Culture results may not be optimal due to an excessive volume of  blood received in culture bottles   Culture NO GROWTH 5 DAYS  Final   Report Status 09/19/2017 FINAL  Final  Blood culture (routine x 2)     Status: None   Collection Time: 09/14/17  7:21 PM  Result Value Ref Range Status   Specimen Description BLOOD LEFT ARM  Final   Special Requests   Final    AEROBIC BOTTLE ONLY Blood Culture results  may not be optimal due to an excessive volume of blood received in culture bottles   Culture NO GROWTH 5 DAYS  Final   Report Status 09/19/2017 FINAL  Final  Urine culture     Status: None   Collection Time: 09/15/17  5:00 AM  Result Value Ref Range Status   Specimen Description URINE, RANDOM  Final   Special Requests NONE  Final   Culture   Final    NO GROWTH Performed at [facility], [address], [place], [place] [address]    Report Status 09/16/2017 FINAL  Final  MRSA PCR Screening     Status: None   Collection Time: 09/15/17 10:52 AM  Result Value Ref Range Status   MRSA by PCR NEGATIVE NEGATIVE Final    Comment:        The GeneXpert MRSA Assay (FDA approved for NASAL specimens only), is one component of a comprehensive MRSA colonization surveillance program. It is not intended to diagnose MRSA infection nor to guide or monitor treatment for MRSA infections.      Labs: BNP (last 3 results)  Recent Labs  10/28/16 2324 11/10/16 1525 02/21/17 2044  BNP 61.0 35.0 74.1   Basic Metabolic Panel:  Recent Labs Lab 09/15/17 0539 09/16/17 0629 09/17/17 0747 09/18/17 0756 09/19/17 0428  NA 136 135 138 138 136  K 4.2 4.2 4.1 4.0 4.5  CL 103 103 104 102 102  CO2 27 23 25 27 28   GLUCOSE 112* 137* 192* 137* 205*  BUN 16 19 25* 23* 27*  CREATININE 1.64* 1.73* 1.70* 1.61* 1.62*  CALCIUM 8.0* 8.1* 8.4* 8.6* 8.1*   Liver Function Tests:  Recent Labs Lab 09/14/17 1546 09/15/17 0539 09/18/17 0756  AST 26 26 34  ALT 17 15* 22  ALKPHOS 70 60 64  BILITOT 0.9 1.3* 0.9  PROT 7.6 6.9 7.4  ALBUMIN 3.3* 2.9* 3.1*   No  results for input(s): LIPASE, AMYLASE in the last 168 hours. No results for input(s): AMMONIA in the last 168 hours. CBC:  Recent Labs Lab 09/14/17 1546 09/15/17 0539 09/16/17 0629 09/18/17 0756 09/19/17 0428  WBC 8.2 7.3 4.2 6.2 7.6  NEUTROABS 6.4  --   --   --   --   HGB 12.0* 11.1* 10.9* 11.3* 10.7*  HCT 36.2* 34.0* 32.9* 33.8* 32.9*  MCV 92.1 92.6 92.2 90.6 92.2  PLT 166 160 157 184 178   Cardiac Enzymes:  Recent Labs Lab 09/14/17 1921 09/14/17 2216 09/15/17 0542 09/15/17 1143  TROPONINI <0.03 <0.03 <0.03 <0.03   BNP: Invalid input(s): POCBNP CBG:  Recent Labs Lab 09/18/17 1136 09/18/17 1654 09/18/17 2036 09/19/17 0725 09/19/17 1129  GLUCAP 237* 381* 378* 166* 193*   D-Dimer No results for input(s): DDIMER in the last 72 hours. Hgb A1c No results for input(s): HGBA1C in the last 72 hours. Lipid Profile No results for input(s): CHOL, HDL, LDLCALC, TRIG, CHOLHDL, LDLDIRECT in the last 72 hours. Thyroid function studies No results for input(s): TSH, T4TOTAL, T3FREE, THYROIDAB in the last 72 hours.  Invalid input(s): FREET3 Anemia work up No results for input(s): VITAMINB12, FOLATE, FERRITIN, TIBC, IRON, RETICCTPCT in the last 72 hours. Urinalysis    Component Value Date/Time   COLORURINE YELLOW 09/15/2017 0500   APPEARANCEUR CLEAR 09/15/2017 0500   LABSPEC 1.014 09/15/2017 0500   PHURINE 5.0 09/15/2017 0500   GLUCOSEU NEGATIVE 09/15/2017 0500   HGBUR MODERATE (A) 09/15/2017 0500   BILIRUBINUR NEGATIVE 09/15/2017 0500   BILIRUBINUR negative  03/05/2016 1254   KETONESUR NEGATIVE 09/15/2017 0500   PROTEINUR 30 (A) 09/15/2017 0500   UROBILINOGEN 1.0 03/05/2016 1254   UROBILINOGEN 0.2 06/02/2015 1310   NITRITE NEGATIVE 09/15/2017 0500   LEUKOCYTESUR NEGATIVE 09/15/2017 0500   Sepsis Labs Invalid input(s): PROCALCITONIN,  WBC,  LACTICIDVEN   Time coordinating discharge: Over 30 minutes  SIGNED:   Janece Canterbury, MD  [facility] 09/19/2017, 12:19 PM Pager   If 7PM-7AM, please contact night-coverage [email or web address] Password [email or web address]

## 2017-09-19 NOTE — Progress Notes (Signed)
Physical Therapy Treatment Patient Details Name: Samuel Ryan MRN: [id number] DOB: 1931/11/16 Today's Date: 09/19/2017    History of Present Illness Samuel Ryan  is a 83 y.o. male, w dementia, apparently presents with cough, w yellow sputum starting yesterday.  Couldn't get in to see pcp because elevator broken and sent to ED.     PT Comments    Patient demonstrates much improvement/endurance for gait training and able to ambulate in hallway followed by wheelchair with his son assisting, required sitting rest break before walking back to bedside and tolerated sitting up in chair after therapy.  Patient's son states he will be able to take care of him at home and has been walking patient in hallway since admission in hospital.  Patient will benefit from continued physical therapy in hospital and recommended venue below to increase strength, balance, endurance for safe ADLs and gait.    Follow Up Recommendations  Home health PT;Supervision for mobility/OOB     Equipment Recommendations  None recommended by PT    Recommendations for Other Services       Precautions / Restrictions Precautions Precautions: Fall Restrictions Weight Bearing Restrictions: No    Mobility  Bed Mobility Overal bed mobility: Needs Assistance Bed Mobility: Supine to Sit;Sit to Supine;Rolling Rolling: Supervision   Supine to sit: Min assist Sit to supine: Min assist      Transfers Overall transfer level: Needs assistance Equipment used: Rolling walker (2 wheeled) Transfers: Sit to/from [facility] Sit to Stand: Min assist Stand pivot transfers: Min assist       General transfer comment: requires verbal/tactile cueing for proper hand placement, reaching behind when sitting fair/good return demonstrated  Ambulation/Gait Ambulation/Gait assistance: Min guard Ambulation Distance (Feet): 50 Feet Assistive device: Rolling walker (2 wheeled) Gait Pattern/deviations: Decreased step  length - right;Decreased step length - left;Decreased stride length   Gait velocity interpretation: Below normal speed for age/gender General Gait Details: Patient demonstrates slow slightly labored unsteady gait without loss of balance, limited secondary to c/o fatigue, required sitting rest break before walking back to room, followed with wheelchair for safety   Stairs            Wheelchair Mobility    Modified Rankin (Stroke Patients Only)       Balance Overall balance assessment: Needs assistance Sitting-balance support: No upper extremity supported;Feet supported Sitting balance-Leahy Scale: Good     Standing balance support: Bilateral upper extremity supported;During functional activity Standing balance-Leahy Scale: Fair                              Cognition Arousal/Alertness: Awake/alert Behavior During Therapy: WFL for tasks assessed/performed Overall Cognitive Status: Within Functional Limits for tasks assessed                                        Exercises General Exercises - Lower Extremity Ankle Circles/Pumps: AROM;Strengthening;Both;15 reps;Supine Long Arc Quad: Seated;AROM;Strengthening;Both;10 reps Heel Slides: AROM;Strengthening;Both;10 reps;Supine Hip Flexion/Marching: Seated;AROM;Strengthening;Both;10 reps    General Comments        Pertinent Vitals/Pain Pain Assessment: No/denies pain    Home Living                      Prior Function            PT Goals (current goals can now be  found in the care plan section) Acute Rehab PT Goals Patient Stated Goal: return home with his son to assist PT Goal Formulation: With patient/family Time For Goal Achievement: 09/23/17 Potential to Achieve Goals: Good Progress towards PT goals: Progressing toward goals    Frequency    Min 3X/week      PT Plan Current plan remains appropriate    Co-evaluation              AM-PAC PT "6 Clicks" Daily  Activity  Outcome Measure  Difficulty turning over in bed (including adjusting bedclothes, sheets and blankets)?: None Difficulty moving from lying on back to sitting on the side of the bed? : A Little Difficulty sitting down on and standing up from a chair with arms (e.g., wheelchair, bedside commode, etc,.)?: A Little Help needed moving to and from a bed to chair (including a wheelchair)?: A Little Help needed walking in hospital room?: A Little Help needed climbing 3-5 steps with a railing? : A Lot 6 Click Score: 18    End of Session Equipment Utilized During Treatment: Gait belt (followed with wheelchair for safety) Activity Tolerance: Patient tolerated treatment well;Patient limited by fatigue Patient left: in chair;with call bell/phone within reach;with bed alarm set;with family/visitor present Nurse Communication: Mobility status PT Visit Diagnosis: Unsteadiness on feet (R26.81);Other abnormalities of gait and mobility (R26.89);Muscle weakness (generalized) (M62.81)     Time: 4163-8453 PT Time Calculation (min) (ACUTE ONLY): 31 min  Charges:  $Gait Training: 8-22 mins $Therapeutic Exercise: 8-22 mins                    G Codes:       10:30 AM, 09-21-17 [name], MPT Physical Therapist with [facility] 336 [phone] office [phone] mobile phone

## 2017-09-19 NOTE — Progress Notes (Signed)
Pharmacy Antibiotic Note  Samuel Ryan is a 83 y.o. male admitted on 09/14/2017 with pneumonia.  Pharmacy has been consulted for [facility] dosing.  Plan: Levaquin 500mg  PO q48hrs (renally adjusted) Duration of therapy per MD (anticipate 8-10 days) Pharmacy will sign off  Height: 5\' 10"  (177.8 cm) Weight: 252 lb 6.8 oz (114.5 kg) IBW/kg (Calculated) : 73  Temp (24hrs), Avg:98.2 F (36.8 C), Min:97.8 F (36.6 C), Max:98.7 F (37.1 C)   Recent Labs Lab 09/14/17 1546 09/14/17 1921 09/14/17 2216 09/15/17 0539 09/16/17 0629 09/17/17 0747 09/18/17 0756 09/19/17 0428  WBC 8.2  --   --  7.3 4.2  --  6.2 7.6  CREATININE 1.75*  --   --  1.64* 1.73* 1.70* 1.61* 1.62*  LATICACIDVEN  --  1.4 1.0  --   --   --   --   --     Estimated Creatinine Clearance: 43 mL/min (A) (by C-G formula based on SCr of 1.62 mg/dL (H)).    Allergies  Allergen Reactions  . Ace Inhibitors Cough  . Vancomycin Swelling and Other (See Comments)    Reaction:  Facial swelling  . Zosyn [Piperacillin Sod-Tazobactam So] Swelling and Other (See Comments)    Reaction:  Facial swelling   Antimicrobials this admission: Levaquin 10/18 >>   Dose adjustments this admission:  Microbiology results:  BCx: No Growth  UCx: Negative  MRSA PCR: Negative  Thank you for allowing pharmacy to be a part of this patient's care.  Hart Robinsons A 09/19/2017 10:48 AM

## 2017-09-19 NOTE — Care Management Important Message (Signed)
Important Message  Patient Details  Name: Samuel Ryan MRN: [id number] Date of Birth: [birth year]   Medicare Important Message Given:  Yes    Sherald Barge, RN 09/19/2017, 12:19 PM

## 2017-09-21 ENCOUNTER — Telehealth: Payer: Self-pay | Admitting: Family Medicine

## 2017-09-21 NOTE — Telephone Encounter (Signed)
Patient can keep 11/7 appt

## 2017-09-21 NOTE — Telephone Encounter (Signed)
Verl Dicker, physical therapist with [facility], left message on nurse line regarding patient. She wanted verbal orders to see patient twice a week for 1 week and three times a week for 3 weeks. Callback# [phone]  Verbal given  Otila Kluver, RN from [facility], also left message on nurse line regarding patient to inform that patient was admitted into home health care and will perform general assessments and go over medication compliance. No request for verbal orders. Callback# [phone]

## 2017-09-21 NOTE — Telephone Encounter (Signed)
CB#l [phone] ([place] - SON)  Samuel Ryan, HE HAS APPT ON 10/05/17 W/ PCP, IS THIS OKAY TO KEEP OR DOES HE NEED TO COME IN SOONER TO SEE ANOTHER PHYSICIAN.

## 2017-09-21 NOTE — Telephone Encounter (Signed)
This is the discharge summary recommendations:  [name], MD. Schedule an appointment as soon as possible for a visit in 2 week(s).   Specialty:  Family Medicine Contact information: [address] [place] [address] [phone]

## 2017-09-23 ENCOUNTER — Other Ambulatory Visit: Payer: Self-pay

## 2017-09-23 NOTE — Patient Outreach (Signed)
[facility]) Care Management  09/23/2017  Samuel Ryan 11/14/1931 [id number]  EMMI: Pneumonia Referral date: 09/23/17 Referral source: EMMI pneumonia red alert Rerral reason: had diarrhea or felt sick to stomach; NO Day # 2  Telephone call to patient regarding EMMI pneumonia red alert. HIPAA verified. Discussed EMMI pneumonia program with patient. Patient states he is not feeling sick or having diarrhea.  Patient states he is doing very well and feeling very good. Patient states he has a follow up appointment with his primary MD on October 05, 2017.  Patient states he has all of his medications and is taking his medications as prescribed.  Patient states he his able to afford his medications at this time and denies any questions or side effects regarding his medicines. [facility] states [facility] for [facility] care.   Patient states he does have a cough still but denies having any shortness of breath. Patient states overall he is doing ok. Patient denies any further needs at this time. Patient verbally agreed to continued automated EMMI pneumonia calls.   ASSESSMENT;  Per patients discharge summary 09/19/17: Admit date: 09/14/2017 Discharge date: 09/19/2017 Home Health:  PT/RN  Admitted From: home  Disposition:  home 83 year old African-American male with a past medical history of dementia who lives with her son  Discharge Diagnoses:  Principal Problem:   CAP (community acquired pneumonia) Active Problems:   Anemia   CKD (chronic kidney disease) stage 3, GFR 30-59 ml/min (HCC)   Hyponatremia  PLAN: RNCM will refer patient to care management assistant to close due to patient being assessed and having no further needs.   Quinn Plowman RN,BSN,CCM [facility] Telephonic  [phone]

## 2017-09-26 ENCOUNTER — Other Ambulatory Visit: Payer: Self-pay

## 2017-09-26 NOTE — Patient Outreach (Signed)
[facility]) Care Management  09/26/2017  Samuel Ryan 11/01/1931 [id number]   EMMI: Pneumonia Referral date: 09/26/17 Referral source: EMMI pneumonia red alert Referral reason: Scheduled follow up appointment: NO, Been to follow up appointment: NO,  More short of breath than yesterday? NO , Sleeping better than when in the hospital: NO Day # 3 and #4   Telephone call to patient regarding EMMI pneumonia red alert. HIPAA verified with patient. Patient states he has a doctors appointment scheduled with his primary MD on October 04, 2017.  Patient states he is not short of breath and is sleeping fine. Patient states he is not coughing as much or producing a lot of phlegm.  Patient states he has completed his antibiotics. Patient states he takes all of his other medications as prescribed. Patient states he is aware of how to reach his doctor if he has concerns and knows to call 911 for emergent symptoms. Patient states his son helps him with his care.  Patient denies any additional concerns at this time and denies having any needs. Patient verbally agreed to ongoing EMMI pneumonia calls.    PLAN: RNCM will refer patient to care management assistant to close patient due to patient being assessed and having no further needs.   Quinn Plowman RN,BSN,CCM [facility] Telephonic  [phone]

## 2017-09-27 ENCOUNTER — Other Ambulatory Visit: Payer: Self-pay

## 2017-09-27 NOTE — Patient Outreach (Signed)
[facility]) Care Management  09/27/2017  Samuel Ryan May 16, 1931 [id number]  EMMI: Pneumonia Referral date: 09/27/17 Referral source: EMMI pneumonia red alert Rerral reason: More short of breath than yesterday? YES Day # 6  Telephone call to patient regarding EMMI pneumonia red alert. HIPAA verified with patient. Patient denies any symptoms. Patient states he is doing very good. Patient verbally agreed to ongoing EMMI pneumonia automated calls.   PLAN; RNCM will refer patient to care management assistant to close due to patient being assessed and having no further needs.   Quinn Plowman RN,BSN,CCM [facility] Telephonic  [phone]

## 2017-09-29 ENCOUNTER — Telehealth: Payer: Self-pay | Admitting: *Deleted

## 2017-09-29 DIAGNOSIS — J189 Pneumonia, unspecified organism: Secondary | ICD-10-CM | POA: Diagnosis not present

## 2017-09-29 DIAGNOSIS — E1122 Type 2 diabetes mellitus with diabetic chronic kidney disease: Secondary | ICD-10-CM | POA: Diagnosis not present

## 2017-09-29 NOTE — Telephone Encounter (Signed)
Patient son called with questions about blood work. Please call [phone]

## 2017-09-29 NOTE — Telephone Encounter (Signed)
Aware to fast

## 2017-09-30 LAB — COMPLETE METABOLIC PANEL WITH GFR
AG Ratio: 1 (calc) (ref 1.0–2.5)
ALBUMIN MSPROF: 3.2 g/dL — AB (ref 3.6–5.1)
ALT: 10 U/L (ref 9–46)
AST: 12 U/L (ref 10–35)
Alkaline phosphatase (APISO): 65 U/L (ref 40–115)
BUN / CREAT RATIO: 10 (calc) (ref 6–22)
BUN: 17 mg/dL (ref 7–25)
CO2: 29 mmol/L (ref 20–32)
CREATININE: 1.76 mg/dL — AB (ref 0.70–1.11)
Calcium: 8.4 mg/dL — ABNORMAL LOW (ref 8.6–10.3)
Chloride: 103 mmol/L (ref 98–110)
GFR, EST AFRICAN AMERICAN: 40 mL/min/{1.73_m2} — AB (ref >=60)
GFR, EST NON AFRICAN AMERICAN: 35 mL/min/{1.73_m2} — AB (ref >=60)
GLUCOSE: 159 mg/dL — AB (ref 65–99)
Globulin: 3.3 g/dL (calc) (ref 1.9–3.7)
Potassium: 4.9 mmol/L (ref 3.5–5.3)
Sodium: 139 mmol/L (ref 135–146)
TOTAL PROTEIN: 6.5 g/dL (ref 6.1–8.1)
Total Bilirubin: 0.9 mg/dL (ref 0.2–1.2)

## 2017-09-30 LAB — VITAMIN D 25 HYDROXY (VIT D DEFICIENCY, FRACTURES): Vit D, 25-Hydroxy: 38 ng/mL (ref 30–100)

## 2017-09-30 LAB — HEMOGLOBIN A1C
Hgb A1c MFr Bld: 9.2 % of total Hgb — ABNORMAL HIGH (ref <5.7)
MEAN PLASMA GLUCOSE: 217 (calc)
eAG (mmol/L): 12 (calc)

## 2017-09-30 LAB — LIPID PANEL
Cholesterol: 147 mg/dL (ref <200)
HDL: 43 mg/dL (ref >40)
LDL CHOLESTEROL (CALC): 81 mg/dL
Non-HDL Cholesterol (Calc): 104 mg/dL (calc) (ref <130)
TRIGLYCERIDES: 133 mg/dL (ref <150)
Total CHOL/HDL Ratio: 3.4 (calc) (ref <5.0)

## 2017-10-03 ENCOUNTER — Other Ambulatory Visit: Payer: Self-pay

## 2017-10-03 NOTE — Patient Outreach (Signed)
[facility]) Care Management  10/03/2017  Samuel Ryan 03-13-1931 [id number]  EMMI: Pneumonia Referral date: 10/03/17 Referral source: EMMI pneumonia red alert Rerral reason: More short of breath than yesterday? YES Day # 10  Telephone call to patient regarding EMMI pneumonia red alert. HIPAA verified with patient. Patient denies having any shortness of breath. Patient states he has a follow up appointment with his primary MD on 10/05/17.  RNCM reviewed pneumonia symptoms with patient. Patient states he has been feeling fine. RNCM advised patient to contact his primary MD for non emergent symptoms and call 911 for emergent symptoms. Patient verbalized understanding. Patient states he has completed his antibiotics. Patient states he is taking all other medications as prescribed. Patient denies any additional needs at this time.  Patient states he would like to have pneumonia EMMI calls discontinued.   PLAN: RNCM will refer patient to care management assistant to close due to patient being assessed and having no further needs.  RNCM will request care management assistant discontinue EMMI pneumonia calls as discussed with patient.   Quinn Plowman RN,BSN,CCM [facility] Telephonic  [phone]

## 2017-10-05 ENCOUNTER — Other Ambulatory Visit: Payer: Self-pay | Admitting: Family Medicine

## 2017-10-05 ENCOUNTER — Other Ambulatory Visit: Payer: Self-pay

## 2017-10-05 ENCOUNTER — Ambulatory Visit (INDEPENDENT_AMBULATORY_CARE_PROVIDER_SITE_OTHER): Payer: Medicare PPO | Admitting: Family Medicine

## 2017-10-05 ENCOUNTER — Ambulatory Visit: Payer: Self-pay | Admitting: Family Medicine

## 2017-10-05 ENCOUNTER — Encounter: Payer: Self-pay | Admitting: Family Medicine

## 2017-10-05 VITALS — BP 140/80 | HR 82 | Resp 16 | Ht 70.0 in | Wt 260.0 lb

## 2017-10-05 DIAGNOSIS — E1165 Type 2 diabetes mellitus with hyperglycemia: Secondary | ICD-10-CM | POA: Diagnosis not present

## 2017-10-05 DIAGNOSIS — E1129 Type 2 diabetes mellitus with other diabetic kidney complication: Secondary | ICD-10-CM

## 2017-10-05 DIAGNOSIS — Z09 Encounter for follow-up examination after completed treatment for conditions other than malignant neoplasm: Secondary | ICD-10-CM | POA: Diagnosis not present

## 2017-10-05 DIAGNOSIS — Z9181 History of falling: Secondary | ICD-10-CM | POA: Diagnosis not present

## 2017-10-05 DIAGNOSIS — IMO0002 Reserved for concepts with insufficient information to code with codable children: Secondary | ICD-10-CM

## 2017-10-05 DIAGNOSIS — Z23 Encounter for immunization: Secondary | ICD-10-CM | POA: Diagnosis not present

## 2017-10-05 DIAGNOSIS — I10 Essential (primary) hypertension: Secondary | ICD-10-CM

## 2017-10-05 MED ORDER — TRAMADOL HCL 50 MG PO TABS
ORAL_TABLET | ORAL | 3 refills | Status: DC
Start: 2017-10-05 — End: 2018-02-14

## 2017-10-05 MED ORDER — VITAMIN D (ERGOCALCIFEROL) 1.25 MG (50000 UNIT) PO CAPS: ORAL_CAPSULE | ORAL | 5 refills | Status: DC

## 2017-10-05 NOTE — Patient Instructions (Addendum)
F//u in 3 month, call if you need me before'    Thanklful  You are much better  Flu vaccine today  Cholesterol is very good.  Please cut back on sweets ,ice cream and sodas  Call if you have concerns about your sugar , it is a bit high this time because he has been sick   Non fasting hBA1c, chem 7 and EGFR, TSH in 3 months, 1 week before next visit    Thank you  for choosing [facility]. We consider it a privelige to serve you.  Delivering excellent health care in a caring and  compassionate way is our goal.  Partnering with you,  so that together we can achieve this goal is our strategy.

## 2017-10-07 NOTE — Assessment & Plan Note (Signed)
Adequate control, no change in medication

## 2017-10-07 NOTE — Assessment & Plan Note (Signed)
Home safety reviewed with pt and caregiver

## 2017-10-07 NOTE — Assessment & Plan Note (Signed)
Hospital records reviewed and course with the patient. Reports improved, and clinically back to baseline, and exam at visit is good, lungs are clear , he is alert and well appearing Labs are repeated as requested , CBC and chem 7, he will need f/u on thyroid function and a rept CXR

## 2017-10-07 NOTE — Progress Notes (Signed)
Samuel Ryan     MRN: [id number]      DOB: [birth year]   HPI Samuel Ryan is here for follow up of recent hospitalization for CAP from 10/17 to 09/19/2017,  and re-evaluation of chronic medical conditions, medication management and review of any available recent lab and radiology data.  Reports feels much better, denies bothersome cough, fever, chills , weakness or excessive fatigue, appetite is good. Blood sugars are provided and are generally less than 150 fasting, had been markedly elevated during his illness. Hospital course is reviewed with patient and accompanying caregiver Preventive health is updated, specifically  Cancer screening and Immunization.   Questions or concerns regarding consultations or procedures which the PT has had in the interim are  addressed. The PT denies any adverse reactions to current medications since the last visit.  There are no new concerns.  There are no specific complaints   ROS  Denies chest pains, palpitations and leg swelling Denies abdominal pain, nausea, vomiting,diarrhea or constipation.   Denies dysuria, frequency, hesitancy or incontinence. Denies uncontrolled  joint pain, swelling and limitation in mobility. Denies headaches, seizures, numbness, or tingling. Denies depression, anxiety or insomnia. Denies skin break down or rash.   PE  BP 140/80   Pulse 82   Resp 16   Ht 5\' 10"  (1.778 m)   Wt 260 lb (117.9 kg)   SpO2 (!) 87%   BMI 37.31 kg/m   Patient alert and oriented and in no cardiopulmonary distress.  HEENT: No facial asymmetry, EOMI,   oropharynx pink and moist.  Neck decreased though adequate ROM no JVD, no mass.  Chest: Clear to auscultation bilaterally.  CVS: S1, S2 no murmurs, no S3.Regular rate.  ABD: Soft non tender.   Ext: No edema  MS: Decreased  ROM spine, shoulders, hips and knees.  Skin: Intact, no ulcerations or rash noted.  Psych: Good eye contact, normal affect. Memory intact not anxious or depressed  appearing.  CNS: CN 2-12 intact, power,  normal throughout.no focal deficits noted.   [facility] discharge follow-up Hospital records reviewed and course with the patient. Reports improved, and clinically back to baseline, and exam at visit is good, lungs are clear , he is alert and well appearing Labs are repeated as requested , CBC and chem 7, he will need f/u on thyroid function and a rept CXR  Type 2 diabetes, uncontrolled, with renal manifestation detreioerate due to recnet infection and need for high dose steroids. Samuel Ryan is reminded of the importance of commitment to daily physical activity for 30 minutes or more, as able and the need to limit carbohydrate intake to 30 to 60 grams per meal to help with blood sugar control.   The need to take medication as prescribed, test blood sugar as directed, and to call between visits if there is a concern that blood sugar is uncontrolled is also discussed.   Samuel Ryan is reminded of the importance of daily foot exam, annual eye examination, and good blood sugar, blood pressure and cholesterol control. Updated lab needed at/ before next visit.   Diabetic Labs Latest Ref Rng & Units 09/29/2017 09/19/2017 09/18/2017 09/17/2017 09/16/2017  HbA1c <5.7 % of total Hgb 9.2(H) - - - -  Microalbumin Not estab mg/dL - - - - -  Micro/Creat Ratio <30 mcg/mg creat - - - - -  Chol <200 mg/dL 147 - - - -  HDL >40 mg/dL 43 - - - -  Calc  LDL <100 mg/dL - - - - -  Triglycerides <150 mg/dL 133 - - - -  Creatinine 0.70 - 1.11 mg/dL 1.76(H) 1.62(H) 1.61(H) 1.70(H) 1.73(H)   BP/Weight 10/05/2017 09/19/2017 09/14/2017 06/21/2017 05/06/2017 12/02/9700 05/01/7857  Systolic BP 850 277 - 412 878 676 720  Diastolic BP 80 87 - 80 77 72 80  Wt. (Lbs) 260 252.43 - 264 270 272 274  BMI 37.31 - 36.22 37.88 38.74 39.03 39.31   Foot/eye exam completion dates Latest Ref Rng & Units 09/22/2015 09/22/2015  Eye Exam No Retinopathy No Retinopathy No  Retinopathy  Foot Form Completion - - -        Essential hypertension Adequate control, no change in medication  At high risk for falls Home safety reviewed with pt and caregiver

## 2017-10-07 NOTE — Assessment & Plan Note (Signed)
detreioerate due to recnet infection and need for high dose steroids. Samuel Ryan is reminded of the importance of commitment to daily physical activity for 30 minutes or more, as able and the need to limit carbohydrate intake to 30 to 60 grams per meal to help with blood sugar control.   The need to take medication as prescribed, test blood sugar as directed, and to call between visits if there is a concern that blood sugar is uncontrolled is also discussed.   Samuel Ryan is reminded of the importance of daily foot exam, annual eye examination, and good blood sugar, blood pressure and cholesterol control. Updated lab needed at/ before next visit.   Diabetic Labs Latest Ref Rng & Units 09/29/2017 09/19/2017 09/18/2017 09/17/2017 09/16/2017  HbA1c <5.7 % of total Hgb 9.2(H) - - - -  Microalbumin Not estab mg/dL - - - - -  Micro/Creat Ratio <30 mcg/mg creat - - - - -  Chol <200 mg/dL 147 - - - -  HDL >40 mg/dL 43 - - - -  Calc LDL <100 mg/dL - - - - -  Triglycerides <150 mg/dL 133 - - - -  Creatinine 0.70 - 1.11 mg/dL 1.76(H) 1.62(H) 1.61(H) 1.70(H) 1.73(H)   BP/Weight 10/05/2017 09/19/2017 09/14/2017 06/21/2017 05/06/2017 12/30/9756 07/01/2548  Systolic BP 826 415 - 830 940 768 088  Diastolic BP 80 87 - 80 77 72 80  Wt. (Lbs) 260 252.43 - 264 270 272 274  BMI 37.31 - 36.22 37.88 38.74 39.03 39.31   Foot/eye exam completion dates Latest Ref Rng & Units 09/22/2015 09/22/2015  Eye Exam No Retinopathy No Retinopathy No Retinopathy  Foot Form Completion - - -

## 2017-10-24 ENCOUNTER — Telehealth: Payer: Self-pay | Admitting: Family Medicine

## 2017-10-24 NOTE — Telephone Encounter (Signed)
Patient left message on nurse line requesting medication for stuffy nose. He has been taking Mucinex twice a day- it is not helping.  Son Callback# [phone] Patient Callback# [phone]

## 2017-10-25 NOTE — Telephone Encounter (Signed)
Son has nasal spray that he was able to fill and its helping. Will call back if needs anything else

## 2017-11-01 ENCOUNTER — Encounter: Payer: Self-pay | Admitting: Family Medicine

## 2017-11-01 ENCOUNTER — Other Ambulatory Visit: Payer: Self-pay | Admitting: Family Medicine

## 2017-11-14 ENCOUNTER — Other Ambulatory Visit: Payer: Self-pay | Admitting: Family Medicine

## 2017-12-02 ENCOUNTER — Other Ambulatory Visit: Payer: Self-pay | Admitting: Family Medicine

## 2017-12-12 ENCOUNTER — Encounter: Payer: Self-pay | Admitting: Family Medicine

## 2017-12-12 ENCOUNTER — Ambulatory Visit (INDEPENDENT_AMBULATORY_CARE_PROVIDER_SITE_OTHER): Payer: Medicare PPO | Admitting: Family Medicine

## 2017-12-12 ENCOUNTER — Ambulatory Visit (HOSPITAL_COMMUNITY)
Admission: RE | Admit: 2017-12-12 | Discharge: 2017-12-12 | Disposition: A | Discharge status: Home / Self Care | Payer: Medicare PPO | Source: Ambulatory Visit | Attending: Family Medicine | Admitting: Family Medicine

## 2017-12-12 VITALS — BP 130/80 | HR 86 | Resp 16 | Ht 70.0 in | Wt 259.0 lb

## 2017-12-12 DIAGNOSIS — E1129 Type 2 diabetes mellitus with other diabetic kidney complication: Secondary | ICD-10-CM | POA: Diagnosis not present

## 2017-12-12 DIAGNOSIS — J984 Other disorders of lung: Secondary | ICD-10-CM | POA: Insufficient documentation

## 2017-12-12 DIAGNOSIS — J189 Pneumonia, unspecified organism: Secondary | ICD-10-CM | POA: Insufficient documentation

## 2017-12-12 DIAGNOSIS — G308 Other Alzheimer's disease: Secondary | ICD-10-CM

## 2017-12-12 DIAGNOSIS — I10 Essential (primary) hypertension: Secondary | ICD-10-CM | POA: Diagnosis not present

## 2017-12-12 DIAGNOSIS — F0280 Dementia in other diseases classified elsewhere without behavioral disturbance: Secondary | ICD-10-CM | POA: Diagnosis not present

## 2017-12-12 DIAGNOSIS — E1165 Type 2 diabetes mellitus with hyperglycemia: Secondary | ICD-10-CM | POA: Diagnosis not present

## 2017-12-12 DIAGNOSIS — N3001 Acute cystitis with hematuria: Secondary | ICD-10-CM | POA: Diagnosis not present

## 2017-12-12 DIAGNOSIS — R05 Cough: Secondary | ICD-10-CM | POA: Diagnosis not present

## 2017-12-12 DIAGNOSIS — R918 Other nonspecific abnormal finding of lung field: Secondary | ICD-10-CM | POA: Diagnosis not present

## 2017-12-12 DIAGNOSIS — R059 Cough, unspecified: Secondary | ICD-10-CM

## 2017-12-12 DIAGNOSIS — IMO0002 Reserved for concepts with insufficient information to code with codable children: Secondary | ICD-10-CM

## 2017-12-12 LAB — POCT URINALYSIS DIPSTICK
APPEARANCE: NORMAL
Bilirubin, UA: NEGATIVE
Glucose, UA: NEGATIVE
Ketones, UA: NEGATIVE
Leukocytes, UA: NEGATIVE
NITRITE UA: NEGATIVE
ODOR: NORMAL
PH UA: 5.5 (ref 5.0–8.0)
PROTEIN UA: 30
Spec Grav, UA: 1.02 (ref 1.010–1.025)
UROBILINOGEN UA: 1 U/dL

## 2017-12-12 MED ORDER — PREDNISONE 5 MG PO TABS: 5.0000 mg | ORAL_TABLET | Freq: Two times a day (BID) | ORAL | 1 refills | Status: AC

## 2017-12-12 NOTE — Patient Instructions (Addendum)
F/u as before , call if you need me sooner  CXR today f/u pneumonia  Labs for next visit as before  Urine to be checked today for infection based on complaint  Keep up great blood sugar values   Medication sent for hand swelling , and pain, use only if needed, and sparingly

## 2017-12-12 NOTE — Assessment & Plan Note (Signed)
Stable, no change in management 

## 2017-12-12 NOTE — Assessment & Plan Note (Signed)
Controlled, no change in medication  

## 2017-12-12 NOTE — Assessment & Plan Note (Signed)
Residual cough remains, and still needs f/u imaging study, CXR ordered

## 2017-12-12 NOTE — Assessment & Plan Note (Signed)
Symptomatic with abnormal uA will f/u c/s

## 2017-12-12 NOTE — Progress Notes (Signed)
Samuel Ryan     MRN: [id number]      DOB: 1931/01/06   HPI Samuel Ryan is here with a 1 day h/o burning and frequency with urination, he denies fever , chills or flank pain, he has had uTI's in the past and his son is extra vigilant. Denies polyuria, polydipsia, blurred vision , or hypoglycemic episodes. Uses lantus 20 units in the morning when  The blood sugar is over 100 FBG log ranges from  88 to 145 C/o excess cough and shortness of breath ever since he had pneumonia , sputum when he gets it up is clear, needs rept imaging done C/o intermittent swelling of left hand , responds to short course of prednisone, requests limited supply be kept at home for acute flare, no flare currently ROS Denies recent fever or chills. Denies sinus pressure, nasal congestion, ear pain or sore throat. C/o chest congestion, productive cough or wheezing.ever since pneumionia Denies chest pains, palpitations and leg swelling Denies abdominal pain, nausea, vomiting,diarrhea or constipation.   C/o dysuria, frequency, hesitancy or incontinence.x 1 day C/o  joint pain,  and limitation in mobility. Denies headaches, seizures, numbness, or tingling. Denies depression, anxiety or insomnia. Denies skin break down or rash.   PE  BP 130/80   Pulse 86   Resp 16   Ht 5\' 10"  (1.778 m)   Wt 259 lb (117.5 kg)   SpO2 90% Comment: room air  BMI 37.16 kg/m  Patient alert and oriented and in no cardiopulmonary distress.  HEENT: No facial asymmetry, EOMI,   oropharynx pink and moist.  Neck supple no JVD, no mass.  Chest: Clear to auscultation bilaterally.Decreased air entry,no wheeze , no crackles  CVS: S1, S2 no murmurs, no S3.Regular rate.  ABD: Soft non tender.   Ext: No edema  MS: Decreased  ROM spine, shoulders, hips and knees.  Skin: Intact, no ulcerations or rash noted.  Psych: Good eye contact, normal affect. Memory impaired  not anxious or depressed appearing.  CNS: CN 2-12 intact, power,   normal throughout.no focal deficits noted.   Assessment & Plan  CAP (community acquired pneumonia) Residual cough remains, and still needs f/u imaging study, CXR ordered  Essential hypertension Controlled, no change in medication   Acute cystitis with hematuria Symptomatic with abnormal uA will f/u c/s  Alzheimer's dementia without behavioral disturbance Stable, no change in management  Type 2 diabetes, uncontrolled, with renal manifestation Improved based on data provided at visit Updated lab needed at/ before next visit.   [name] is reminded of the importance of commitment to daily physical activity for 30 minutes or more, as able and the need to limit carbohydrate intake to 30 to 60 grams per meal to help with blood sugar control.   The need to take medication as prescribed, test blood sugar as directed, and to call between visits if there is a concern that blood sugar is uncontrolled is also discussed.   [name] is reminded of the importance of daily foot exam, annual eye examination, and good blood sugar, blood pressure and cholesterol control.  Diabetic Labs Latest Ref Rng & Units 09/29/2017 09/19/2017 09/18/2017 09/17/2017 09/16/2017  HbA1c <5.7 % of total Hgb 9.2(H) - - - -  Microalbumin Not estab mg/dL - - - - -  Micro/Creat Ratio <30 mcg/mg creat - - - - -  Chol <200 mg/dL 147 - - - -  HDL >40 mg/dL 43 - - - -  Calc LDL <100  mg/dL - - - - -  Triglycerides <150 mg/dL 133 - - - -  Creatinine 0.70 - 1.11 mg/dL 1.76(H) 1.62(H) 1.61(H) 1.70(H) 1.73(H)   BP/Weight 12/12/2017 10/05/2017 09/19/2017 09/14/2017 06/21/2017 03/05/4258 04/03/3874  Systolic BP 643 329 518 - 841 660 630  Diastolic BP 80 80 87 - 80 77 72  Wt. (Lbs) 259 260 252.43 - 264 270 272  BMI 37.16 37.31 - 36.22 37.88 38.74 39.03   Foot/eye exam completion dates Latest Ref Rng & Units 09/22/2015 09/22/2015  Eye Exam No Retinopathy No Retinopathy No Retinopathy  Foot Form Completion - - -

## 2017-12-12 NOTE — Assessment & Plan Note (Signed)
Improved based on data provided at visit Updated lab needed at/ before next visit.   Samuel Ryan is reminded of the importance of commitment to daily physical activity for 30 minutes or more, as able and the need to limit carbohydrate intake to 30 to 60 grams per meal to help with blood sugar control.   The need to take medication as prescribed, test blood sugar as directed, and to call between visits if there is a concern that blood sugar is uncontrolled is also discussed.   Samuel Ryan is reminded of the importance of daily foot exam, annual eye examination, and good blood sugar, blood pressure and cholesterol control.  Diabetic Labs Latest Ref Rng & Units 09/29/2017 09/19/2017 09/18/2017 09/17/2017 09/16/2017  HbA1c <5.7 % of total Hgb 9.2(H) - - - -  Microalbumin Not estab mg/dL - - - - -  Micro/Creat Ratio <30 mcg/mg creat - - - - -  Chol <200 mg/dL 147 - - - -  HDL >40 mg/dL 43 - - - -  Calc LDL <100 mg/dL - - - - -  Triglycerides <150 mg/dL 133 - - - -  Creatinine 0.70 - 1.11 mg/dL 1.76(H) 1.62(H) 1.61(H) 1.70(H) 1.73(H)   BP/Weight 12/12/2017 10/05/2017 09/19/2017 09/14/2017 06/21/2017 08/01/2354 05/31/2201  Systolic BP 542 706 237 - 628 315 176  Diastolic BP 80 80 87 - 80 77 72  Wt. (Lbs) 259 260 252.43 - 264 270 272  BMI 37.16 37.31 - 36.22 37.88 38.74 39.03   Foot/eye exam completion dates Latest Ref Rng & Units 09/22/2015 09/22/2015  Eye Exam No Retinopathy No Retinopathy No Retinopathy  Foot Form Completion - - -

## 2017-12-13 ENCOUNTER — Encounter: Payer: Self-pay | Admitting: Family Medicine

## 2017-12-13 LAB — URINE CULTURE
MICRO NUMBER:: 90054917
SPECIMEN QUALITY: ADEQUATE

## 2017-12-16 ENCOUNTER — Telehealth: Payer: Self-pay | Admitting: Family Medicine

## 2017-12-16 DIAGNOSIS — N39498 Other specified urinary incontinence: Secondary | ICD-10-CM

## 2017-12-16 NOTE — Telephone Encounter (Signed)
Pt needs a referral to [facility], Appt has been made with [facility] Appt 2-20 @ 10:30. If you need further information please reach out to the pt, otherwise just place referral.

## 2017-12-16 NOTE — Telephone Encounter (Signed)
Referral entered  

## 2017-12-16 NOTE — Telephone Encounter (Signed)
Patients son Damien Batty requesting a call to go over results for fathers urine test done at his last appt  Cb#: [id number]

## 2017-12-16 NOTE — Telephone Encounter (Signed)
Patient's son aware of results.

## 2017-12-22 DIAGNOSIS — K219 Gastro-esophageal reflux disease without esophagitis: Secondary | ICD-10-CM | POA: Diagnosis not present

## 2017-12-22 DIAGNOSIS — E1165 Type 2 diabetes mellitus with hyperglycemia: Secondary | ICD-10-CM | POA: Diagnosis not present

## 2017-12-22 DIAGNOSIS — E669 Obesity, unspecified: Secondary | ICD-10-CM | POA: Diagnosis not present

## 2017-12-22 DIAGNOSIS — N3281 Overactive bladder: Secondary | ICD-10-CM | POA: Diagnosis not present

## 2017-12-22 DIAGNOSIS — E785 Hyperlipidemia, unspecified: Secondary | ICD-10-CM | POA: Diagnosis not present

## 2017-12-22 DIAGNOSIS — J302 Other seasonal allergic rhinitis: Secondary | ICD-10-CM | POA: Diagnosis not present

## 2017-12-22 DIAGNOSIS — M25569 Pain in unspecified knee: Secondary | ICD-10-CM | POA: Diagnosis not present

## 2017-12-22 DIAGNOSIS — N400 Enlarged prostate without lower urinary tract symptoms: Secondary | ICD-10-CM | POA: Diagnosis not present

## 2017-12-22 DIAGNOSIS — Z794 Long term (current) use of insulin: Secondary | ICD-10-CM | POA: Diagnosis not present

## 2017-12-22 DIAGNOSIS — F329 Major depressive disorder, single episode, unspecified: Secondary | ICD-10-CM | POA: Diagnosis not present

## 2017-12-29 ENCOUNTER — Other Ambulatory Visit: Payer: Self-pay | Admitting: Family Medicine

## 2018-01-09 DIAGNOSIS — H40033 Anatomical narrow angle, bilateral: Secondary | ICD-10-CM | POA: Diagnosis not present

## 2018-01-09 DIAGNOSIS — H2513 Age-related nuclear cataract, bilateral: Secondary | ICD-10-CM | POA: Diagnosis not present

## 2018-01-09 DIAGNOSIS — Z0100 Encounter for examination of eyes and vision without abnormal findings: Secondary | ICD-10-CM | POA: Diagnosis not present

## 2018-01-17 ENCOUNTER — Telehealth: Payer: Self-pay | Admitting: Family Medicine

## 2018-01-17 NOTE — Telephone Encounter (Signed)
Aware he has labs ordered

## 2018-01-17 NOTE — Telephone Encounter (Signed)
Patient left message on nurse line requesting call back. He has questions about upcoming labs.

## 2018-01-18 ENCOUNTER — Ambulatory Visit: Payer: Medicare HMO | Admitting: Urology

## 2018-01-18 DIAGNOSIS — N401 Enlarged prostate with lower urinary tract symptoms: Secondary | ICD-10-CM | POA: Diagnosis not present

## 2018-01-18 DIAGNOSIS — R3915 Urgency of urination: Secondary | ICD-10-CM

## 2018-01-18 DIAGNOSIS — E1129 Type 2 diabetes mellitus with other diabetic kidney complication: Secondary | ICD-10-CM | POA: Diagnosis not present

## 2018-01-18 DIAGNOSIS — E1165 Type 2 diabetes mellitus with hyperglycemia: Secondary | ICD-10-CM | POA: Diagnosis not present

## 2018-01-19 LAB — BASIC METABOLIC PANEL
BUN/Creatinine Ratio: 10 (calc) (ref 6–22)
BUN: 17 mg/dL (ref 7–25)
CO2: 26 mmol/L (ref 20–32)
CREATININE: 1.74 mg/dL — AB (ref 0.70–1.11)
Calcium: 8.2 mg/dL — ABNORMAL LOW (ref 8.6–10.3)
Chloride: 105 mmol/L (ref 98–110)
Glucose, Bld: 162 mg/dL — ABNORMAL HIGH (ref 65–139)
POTASSIUM: 4.9 mmol/L (ref 3.5–5.3)
SODIUM: 139 mmol/L (ref 135–146)

## 2018-01-19 LAB — TSH: TSH: 0.86 mIU/L (ref 0.40–4.50)

## 2018-01-19 LAB — HEMOGLOBIN A1C
EAG (MMOL/L): 10.3 (calc)
Hgb A1c MFr Bld: 8.1 % of total Hgb — ABNORMAL HIGH (ref <5.7)
MEAN PLASMA GLUCOSE: 186 (calc)

## 2018-01-24 ENCOUNTER — Encounter: Payer: Self-pay | Admitting: Family Medicine

## 2018-01-24 ENCOUNTER — Ambulatory Visit (INDEPENDENT_AMBULATORY_CARE_PROVIDER_SITE_OTHER): Payer: Medicare HMO | Admitting: Family Medicine

## 2018-01-24 VITALS — BP 140/82 | HR 92 | Resp 16 | Ht 70.0 in | Wt 259.0 lb

## 2018-01-24 DIAGNOSIS — E785 Hyperlipidemia, unspecified: Secondary | ICD-10-CM

## 2018-01-24 DIAGNOSIS — R0902 Hypoxemia: Secondary | ICD-10-CM | POA: Diagnosis not present

## 2018-01-24 DIAGNOSIS — E1165 Type 2 diabetes mellitus with hyperglycemia: Secondary | ICD-10-CM | POA: Diagnosis not present

## 2018-01-24 DIAGNOSIS — F329 Major depressive disorder, single episode, unspecified: Secondary | ICD-10-CM | POA: Diagnosis not present

## 2018-01-24 DIAGNOSIS — E1129 Type 2 diabetes mellitus with other diabetic kidney complication: Secondary | ICD-10-CM

## 2018-01-24 DIAGNOSIS — G308 Other Alzheimer's disease: Secondary | ICD-10-CM

## 2018-01-24 DIAGNOSIS — F0393 Unspecified dementia, unspecified severity, with mood disturbance: Secondary | ICD-10-CM

## 2018-01-24 DIAGNOSIS — IMO0002 Reserved for concepts with insufficient information to code with codable children: Secondary | ICD-10-CM

## 2018-01-24 DIAGNOSIS — F0280 Dementia in other diseases classified elsewhere without behavioral disturbance: Secondary | ICD-10-CM | POA: Diagnosis not present

## 2018-01-24 NOTE — Patient Instructions (Addendum)
F/u in 4.5 months, call if you need  Me before.  Excellent blood sugar  Foot exam is good   Fasting lipid, cmp and EGFR, hBa1C, in 4.5 months  Thank you  for choosing [facility]. We consider it a privelige to serve you.  Delivering excellent health care in a caring and  compassionate way is our goal.  Partnering with you,  so that together we can achieve this goal is our strategy.

## 2018-01-25 ENCOUNTER — Telehealth: Payer: Self-pay

## 2018-01-25 NOTE — Telephone Encounter (Signed)
[name] called back and said that he may be ok with a small portable oxygen tank that is light and his dad can carry.

## 2018-01-25 NOTE — Telephone Encounter (Signed)
pls refer for portable oxygen based on office visit his oxygen sat qualifies, see if we can get this please, thanks. I did discuss with him at the visit

## 2018-01-26 ENCOUNTER — Other Ambulatory Visit: Payer: Self-pay | Admitting: Family Medicine

## 2018-01-26 NOTE — Telephone Encounter (Signed)
I will need a complete office note stating the need (closed) then I can send order

## 2018-01-27 NOTE — Progress Notes (Signed)
Samuel Ryan     MRN: [id number]      DOB: 14-Jul-1931   HPI [name] is here for follow up and re-evaluation of chronic medical conditions, medication management and review of any available recent lab and radiology data.  .   Questions or concerns regarding consultations or procedures which the PT has had in the interim are  Addressed.Has recently been to urology re incontinence issues The PT denies any adverse reactions to current medications since the last visit.  Although he clearly needs oxygen, his son is concerned that  This is a greater risk at home , afraid that his father will fall, AFTER THE VISIT, HE CALLED BACK SAYING THAT HE WAS AGREEABLE TO PORTABLE OXYGEN Denies polyuria, polydipsia, blurred vision , or hypoglycemic episodes. Blood sugar log brought to visit an reveals good control   ROS Denies recent fever or chills. Denies sinus pressure, nasal congestion, ear pain or sore throat. Denies chest congestion, productive cough or wheezing.Has a chronic cough. Denies chest pains, palpitations and leg swelling Denies abdominal pain, nausea, vomiting,diarrhea or constipation.   Denies dysuria, frequency, hesitancy or incontinence. C/o chronic joint stiffness and  limitation in mobility. Denies headaches, seizures, numbness, or tingling. Denies depression, anxiety or insomnia. Denies skin break down or rash.   PE  BP 140/82   Pulse 92   Resp 16   Ht 5\' 10"  (1.778 m)   Wt 259 lb (117.5 kg)   SpO2 (!) 84%   BMI 37.16 kg/m   Patient alert and oriented and in mild   cardiopulmonary distress.  HEENT: No facial asymmetry, EOMI,   oropharynx pink and moist.  Neck decreased ROM no JVD, thyromegaly  Chest: Decreased air entry throughout , no crackles or wheezes  CVS: S1, S2 no murmurs, no S3.Regular rate.  ABD: Soft non tender.   Ext: No edema  MS: decreased  ROM spine, shoulders, hips and knees.  Skin: Intact, no ulcerations or rash noted.  Psych: Good eye  contact, normal affect. Memory iimpaired not anxious or depressed appearing.  CNS: CN 2-12 intact, power,  normal throughout.no focal deficits noted.   Assessment & Plan  Type 2 diabetes, uncontrolled, with renal manifestation [name] is reminded of the importance of commitment to daily physical activity for 30 minutes or more, as able and the need to limit carbohydrate intake to 30 to 60 grams per meal to help with blood sugar control.   The need to take medication as prescribed, test blood sugar as directed, and to call between visits if there is a concern that blood sugar is uncontrolled is also discussed.   [name] is reminded of the importance of daily foot exam, annual eye examination, and good blood sugar, blood pressure and cholesterol control. Markedly improved and at goal!  No med change  Diabetic Labs Latest Ref Rng & Units 01/18/2018 09/29/2017 09/19/2017 09/18/2017 09/17/2017  HbA1c <5.7 % of total Hgb 8.1(H) 9.2(H) - - -  Microalbumin Not estab mg/dL - - - - -  Micro/Creat Ratio <30 mcg/mg creat - - - - -  Chol <200 mg/dL - 147 - - -  HDL >40 mg/dL - 43 - - -  Calc LDL <100 mg/dL - - - - -  Triglycerides <150 mg/dL - 133 - - -  Creatinine 0.70 - 1.11 mg/dL 1.74(H) 1.76(H) 1.62(H) 1.61(H) 1.70(H)   BP/Weight 01/24/2018 12/12/2017 10/05/2017 09/19/2017 09/14/2017 05/28/5283 12/01/2438  Systolic BP 102 725 366 440 - 138 137  Diastolic BP 82 80 80 87 - 80 77  Wt. (Lbs) 259 259 260 252.43 - 264 270  BMI 37.16 37.16 37.31 - 36.22 37.88 38.74   Foot/eye exam completion dates Latest Ref Rng & Units 01/24/2018 09/22/2015  Eye Exam No Retinopathy - No Retinopathy  Foot Form Completion - Done -         Hyperlipidemia with target LDL less than 100 Hyperlipidemia:Low fat diet discussed and encouraged.   Lipid Panel  Lab Results  Component Value Date   CHOL 147 09/29/2017   HDL 43 09/29/2017   LDLCALC 80 02/24/2017   TRIG 133 09/29/2017   CHOLHDL 3.4 09/29/2017    Updated lab needed at/ before next visit. Controlled when last checked    Hypoxia Hypoxia in patient with interstitial fibrosis and chronic hypertension, needs supplemental oxygen, however son reports increased fall risk to having oxygen in the home, however agrees to portable oxygen for use when he goes out. With an oxygen level of 84% on room air , he  Qualifies for and needs supplemental oxygen at 2l/min in a portable tank  Alzheimer's dementia without behavioral disturbance Controlled and stable on current medication  Depression due to dementia Controlled, no change in medication

## 2018-01-27 NOTE — Assessment & Plan Note (Signed)
Hyperlipidemia:Low fat diet discussed and encouraged.   Lipid Panel  Lab Results  Component Value Date   CHOL 147 09/29/2017   HDL 43 09/29/2017   LDLCALC 80 02/24/2017   TRIG 133 09/29/2017   CHOLHDL 3.4 09/29/2017   Updated lab needed at/ before next visit. Controlled when last checked

## 2018-01-27 NOTE — Assessment & Plan Note (Signed)
Samuel Ryan is reminded of the importance of commitment to daily physical activity for 30 minutes or more, as able and the need to limit carbohydrate intake to 30 to 60 grams per meal to help with blood sugar control.   The need to take medication as prescribed, test blood sugar as directed, and to call between visits if there is a concern that blood sugar is uncontrolled is also discussed.   Samuel Ryan is reminded of the importance of daily foot exam, annual eye examination, and good blood sugar, blood pressure and cholesterol control. Markedly improved and at goal!  No med change  Diabetic Labs Latest Ref Rng & Units 01/18/2018 09/29/2017 09/19/2017 09/18/2017 09/17/2017  HbA1c <5.7 % of total Hgb 8.1(H) 9.2(H) - - -  Microalbumin Not estab mg/dL - - - - -  Micro/Creat Ratio <30 mcg/mg creat - - - - -  Chol <200 mg/dL - 147 - - -  HDL >40 mg/dL - 43 - - -  Calc LDL <100 mg/dL - - - - -  Triglycerides <150 mg/dL - 133 - - -  Creatinine 0.70 - 1.11 mg/dL 1.74(H) 1.76(H) 1.62(H) 1.61(H) 1.70(H)   BP/Weight 01/24/2018 12/12/2017 10/05/2017 09/19/2017 09/14/2017 3/70/4888 07/30/6944  Systolic BP 038 882 800 349 - 179 150  Diastolic BP 82 80 80 87 - 80 77  Wt. (Lbs) 259 259 260 252.43 - 264 270  BMI 37.16 37.16 37.31 - 36.22 37.88 38.74   Foot/eye exam completion dates Latest Ref Rng & Units 01/24/2018 09/22/2015  Eye Exam No Retinopathy - No Retinopathy  Foot Form Completion - Done -

## 2018-01-27 NOTE — Telephone Encounter (Signed)
Order waiting on dr sig and will fax

## 2018-01-27 NOTE — Assessment & Plan Note (Signed)
Controlled, no change in medication  

## 2018-01-27 NOTE — Telephone Encounter (Signed)
Note completed 

## 2018-01-27 NOTE — Assessment & Plan Note (Addendum)
Hypoxia in patient with interstitial fibrosis and chronic hypertension, needs supplemental oxygen, however son reports increased fall risk to having oxygen in the home, however agrees to portable oxygen for use when he goes out. With an oxygen level of 84% on room air , he  Qualifies for and needs supplemental oxygen at 2l/min in a portable tank

## 2018-01-27 NOTE — Assessment & Plan Note (Signed)
Controlled and stable on current medication

## 2018-01-30 ENCOUNTER — Telehealth: Payer: Self-pay | Admitting: Family Medicine

## 2018-01-30 ENCOUNTER — Other Ambulatory Visit: Payer: Self-pay | Admitting: Family Medicine

## 2018-01-30 DIAGNOSIS — G308 Other Alzheimer's disease: Secondary | ICD-10-CM | POA: Diagnosis not present

## 2018-01-30 DIAGNOSIS — E1129 Type 2 diabetes mellitus with other diabetic kidney complication: Secondary | ICD-10-CM | POA: Diagnosis not present

## 2018-01-30 DIAGNOSIS — R0902 Hypoxemia: Secondary | ICD-10-CM | POA: Diagnosis not present

## 2018-01-30 DIAGNOSIS — F329 Major depressive disorder, single episode, unspecified: Secondary | ICD-10-CM | POA: Diagnosis not present

## 2018-01-30 NOTE — Telephone Encounter (Signed)
Sent back

## 2018-01-30 NOTE — Telephone Encounter (Signed)
Received call from [facility] needing to speak to someone about the order for Oxygen for patient. She stated that the order was missing the prescribed leader flow. The number to call is [phone] or a revised order can be faxed to [phone].

## 2018-02-08 ENCOUNTER — Other Ambulatory Visit: Payer: Self-pay | Admitting: Family Medicine

## 2018-02-14 ENCOUNTER — Telehealth: Payer: Self-pay | Admitting: Family Medicine

## 2018-02-14 ENCOUNTER — Other Ambulatory Visit: Payer: Self-pay

## 2018-02-14 MED ORDER — TRAMADOL HCL 50 MG PO TABS: ORAL_TABLET | ORAL | 3 refills | Status: DC

## 2018-02-14 MED ORDER — PEN NEEDLES 31G X 5 MM MISC: 1.0000 | 5 refills | Status: DC

## 2018-02-14 NOTE — Telephone Encounter (Signed)
Pt is calling in to advise that he needs his pain meds and insulin needles--

## 2018-02-14 NOTE — Telephone Encounter (Signed)
Done

## 2018-03-02 DIAGNOSIS — F329 Major depressive disorder, single episode, unspecified: Secondary | ICD-10-CM | POA: Diagnosis not present

## 2018-03-02 DIAGNOSIS — E1129 Type 2 diabetes mellitus with other diabetic kidney complication: Secondary | ICD-10-CM | POA: Diagnosis not present

## 2018-03-02 DIAGNOSIS — R0902 Hypoxemia: Secondary | ICD-10-CM | POA: Diagnosis not present

## 2018-03-02 DIAGNOSIS — G308 Other Alzheimer's disease: Secondary | ICD-10-CM | POA: Diagnosis not present

## 2018-03-26 ENCOUNTER — Other Ambulatory Visit: Payer: Self-pay | Admitting: Family Medicine

## 2018-04-01 DIAGNOSIS — R0902 Hypoxemia: Secondary | ICD-10-CM | POA: Diagnosis not present

## 2018-04-01 DIAGNOSIS — E1129 Type 2 diabetes mellitus with other diabetic kidney complication: Secondary | ICD-10-CM | POA: Diagnosis not present

## 2018-04-01 DIAGNOSIS — G308 Other Alzheimer's disease: Secondary | ICD-10-CM | POA: Diagnosis not present

## 2018-04-01 DIAGNOSIS — F329 Major depressive disorder, single episode, unspecified: Secondary | ICD-10-CM | POA: Diagnosis not present

## 2018-04-12 ENCOUNTER — Ambulatory Visit (INDEPENDENT_AMBULATORY_CARE_PROVIDER_SITE_OTHER): Payer: Medicare HMO | Admitting: Urology

## 2018-04-12 DIAGNOSIS — R3915 Urgency of urination: Secondary | ICD-10-CM | POA: Diagnosis not present

## 2018-04-12 DIAGNOSIS — N401 Enlarged prostate with lower urinary tract symptoms: Secondary | ICD-10-CM

## 2018-04-17 ENCOUNTER — Other Ambulatory Visit: Payer: Self-pay | Admitting: Urology

## 2018-05-02 DIAGNOSIS — G308 Other Alzheimer's disease: Secondary | ICD-10-CM | POA: Diagnosis not present

## 2018-05-02 DIAGNOSIS — E1129 Type 2 diabetes mellitus with other diabetic kidney complication: Secondary | ICD-10-CM | POA: Diagnosis not present

## 2018-05-02 DIAGNOSIS — F329 Major depressive disorder, single episode, unspecified: Secondary | ICD-10-CM | POA: Diagnosis not present

## 2018-05-02 DIAGNOSIS — R0902 Hypoxemia: Secondary | ICD-10-CM | POA: Diagnosis not present

## 2018-05-05 NOTE — Patient Instructions (Signed)
Samuel Ryan  05/05/2018     @PREFPERIOPPHARMACY @   Your procedure is scheduled on  05/15/2018 .  Report to [facility] at  830   A.M.  Call this number if you have problems the morning of surgery:  [phone]   Remember:  No food after midnight.  You may drink clear liquids until 12 midnight 05/14/2018 .  Clear liquids allowed are:                    Water, Juice (non-citric and without pulp), Carbonated beverages, Clear Tea, Black Coffee only, Plain Jell-O only, Gatorade and Plain Popsicles only    Take these medicines the morning of surgery with A SIP OF WATER claritin, prilosec, zoloft, flomax, ultram.    Do not wear jewelry, make-up or nail polish.  Do not wear lotions, powders, or perfumes, or deodorant.  Do not shave 48 hours prior to surgery.  Men may shave face and neck.  Do not bring valuables to the hospital.  [facility] is not responsible for any belongings or valuables.  Contacts, dentures or bridgework may not be worn into surgery.  Leave your suitcase in the car.  After surgery it may be brought to your room.  For patients admitted to the hospital, discharge time will be determined by your treatment team.  Patients discharged the day of surgery will not be allowed to drive home.   Name and phone number of your driver:   family Special instructions:  None  Please read over the following fact sheets that you were given. Anesthesia Post-op Instructions and Care and Recovery After Surgery       Cystoscopy Cystoscopy is a procedure that is used to help diagnose and sometimes treat conditions that affect that lower urinary tract. The lower urinary tract includes the bladder and the tube that drains urine from the bladder out of the body (urethra). Cystoscopy is performed with a thin, tube-shaped instrument with a light and camera at the end (cystoscope). The cystoscope may be hard (rigid) or flexible, depending on the goal of the  procedure.The cystoscope is inserted through the urethra, into the bladder. Cystoscopy may be recommended if you have:  Urinary tractinfections that keep coming back (recurring).  Blood in the urine (hematuria).  Loss of bladder control (urinary incontinence) or an overactive bladder.  Unusual cells found in a urine sample.  A blockage in the urethra.  Painful urination.  An abnormality in the bladder found during an intravenous pyelogram (IVP) or CT scan.  Cystoscopy may also be done to remove a sample of tissue to be examined under a microscope (biopsy). Tell a health care provider about:  Any allergies you have.  All medicines you are taking, including vitamins, herbs, eye drops, creams, and over-the-counter medicines.  Any problems you or family members have had with anesthetic medicines.  Any blood disorders you have.  Any surgeries you have had.  Any medical conditions you have.  Whether you are pregnant or may be pregnant. What are the risks? Generally, this is a safe procedure. However, problems may occur, including:  Infection.  Bleeding.  Allergic reactions to medicines.  Damage to other structures or organs.  What happens before the procedure?  Ask your health care provider about: ? Changing or stopping your regular medicines. This is especially important if you are taking diabetes medicines or blood thinners. ? Taking medicines such  as aspirin and ibuprofen. These medicines can thin your blood. Do not take these medicines before your procedure if your health care provider instructs you not to.  Follow instructions from your health care provider about eating or drinking restrictions.  You may be given antibiotic medicine to help prevent infection.  You may have an exam or testing, such as X-rays of the bladder, urethra, or kidneys.  You may have urine tests to check for signs of infection.  Plan to have someone take you home after the  procedure. What happens during the procedure?  To reduce your risk of infection,your health care team will wash or sanitize their hands.  You will be given one or more of the following: ? A medicine to help you relax (sedative). ? A medicine to numb the area (local anesthetic).  The area around the opening of your urethra will be cleaned.  The cystoscope will be passed through your urethra into your bladder.  Germ-free (sterile)fluid will flow through the cystoscope to fill your bladder. The fluid will stretch your bladder so that your surgeon can clearly examine your bladder walls.  The cystoscope will be removed and your bladder will be emptied. The procedure may vary among health care providers and hospitals. What happens after the procedure?  You may have some soreness or pain in your abdomen and urethra. Medicines will be available to help you.  You may have some blood in your urine.  Do not drive for 24 hours if you received a sedative. This information is not intended to replace advice given to you by your health care provider. Make sure you discuss any questions you have with your health care provider. Document Released: 11/12/2000 Document Revised: 03/25/2016 Document Reviewed: 10/02/2015 Elsevier Interactive Patient Education  2018 [employer].  Cystoscopy, Care After Refer to this sheet in the next few weeks. These instructions provide you with information about caring for yourself after your procedure. Your health care provider may also give you more specific instructions. Your treatment has been planned according to current medical practices, but problems sometimes occur. Call your health care provider if you have any problems or questions after your procedure. What can I expect after the procedure? After the procedure, it is common to have:  Mild pain when you urinate. Pain should stop within a few minutes after you urinate. This may last for up to 1 week.  A  small amount of blood in your urine for several days.  Feeling like you need to urinate but producing only a small amount of urine.  Follow these instructions at home:  Medicines  Take over-the-counter and prescription medicines only as told by your health care provider.  If you were prescribed an antibiotic medicine, take it as told by your health care provider. Do not stop taking the antibiotic even if you start to feel better. General instructions   Return to your normal activities as told by your health care provider. Ask your health care provider what activities are safe for you.  Do not drive for 24 hours if you received a sedative.  Watch for any blood in your urine. If the amount of blood in your urine increases, call your health care provider.  Follow instructions from your health care provider about eating or drinking restrictions.  If a tissue sample was removed for testing (biopsy) during your procedure, it is your responsibility to get your test results. Ask your health care provider or the department performing the test when  your results will be ready.  Drink enough fluid to keep your urine clear or pale yellow.  Keep all follow-up visits as told by your health care provider. This is important. Contact a health care provider if:  You have pain that gets worse or does not get better with medicine, especially pain when you urinate.  You have difficulty urinating. Get help right away if:  You have more blood in your urine.  You have blood clots in your urine.  You have abdominal pain.  You have a fever or chills.  You are unable to urinate. This information is not intended to replace advice given to you by your health care provider. Make sure you discuss any questions you have with your health care provider. Document Released: 06/04/2005 Document Revised: 04/22/2016 Document Reviewed: 10/02/2015 Elsevier Interactive Patient Education  2018 [place].  Botulinum Toxin Bladder Injection A botulinum toxin bladder injection is a procedure to treat an overactive bladder. During the procedure, a drug called botulinum toxin is injected into the bladder through a long, thin needle. This drug relaxes the bladder muscles and reduces overactivity. You may need this procedure if your medicines are not working or you cannot take them. The procedure may be repeated as needed. Tell a health care provider about:  Any allergies you have.  All medicines you are taking, including vitamins, herbs, eye drops, creams, and over-the-counter medicines.  Any problems you or family members have had with anesthetic medicines.  Any blood disorders you have.  Any surgeries you have had.  Other medical conditions you have.  Any previous reactions to a botulinum toxin injection.  Any symptoms of urinary tract infection. These include chills, fever, a burning feeling when passing urine, and needing to pass urine often. What are the risks? Generally this is a safe procedure. However, problems may occur, including:  Not being able to pass urine. If this happens, you may need to have your bladder emptied with a thin tube (bladder catheter).  Bleeding.  Urinary tract infection.  Allergic reaction to the botulinum toxin.  Pain or burning when passing urine.  Damage to other structures or organs.  What happens before the procedure?  Ask your health care provider about: ? Changing or stopping your regular medicines. This is especially important if you are taking diabetes medicines or blood thinners. ? Taking medicines such as aspirin and ibuprofen. These medicines can thin your blood. Do not take these medicines before your procedure if your health care provider instructs you not to.  Follow instructions from your health care provider about eating or drinking restrictions.  You may be given antibiotic medicine to help prevent infection.  Plan to have  someone take you home after the procedure. What happens during the procedure?  You will be asked to empty your bladder.  To reduce your risk of infection: ? Your health care team will wash or sanitize their hands. ? Your skin will be washed with soap.  An IV tube will be inserted into one of your veins.  You will be given one or more of the following: ? A medicine to help you relax (sedative). ? A medicine to numb the area (local anesthetic). ? A medicine to make you fall asleep (general anesthetic).  A long, thin scope called a cystoscope will be passed into your bladder through the tube that carries urine from your bladder (urethra).  The cystoscope will be used to fill your bladder with water.  A long needle  will be passed through the cystoscope and into the bladder.  The botulinum toxin will be injected into your bladder. It may be injected into multiple areas of your bladder.  Your bladder will be emptied, and the cystoscope will be removed. The procedure may vary among health care providers and hospitals. What happens after the procedure?  Your blood pressure, heart rate, breathing rate, and blood oxygen level will be monitored often until the medicines you were given have worn off.  Do not drive for 24 hours if you received a sedative. This information is not intended to replace advice given to you by your health care provider. Make sure you discuss any questions you have with your health care provider. Document Released: 08/06/2015 Document Revised: 04/22/2016 Document Reviewed: 05/14/2015 Elsevier Interactive Patient Education  2018 [place].  Botulinum Toxin Bladder Injection, Care After Refer to this sheet in the next few weeks. These instructions provide you with information about caring for yourself after your procedure. Your health care provider may also give you more specific instructions. Your treatment has been planned according to current medical practices,  but problems sometimes occur. Call your health care provider if you have any problems or questions after your procedure. What can I expect after the procedure? After the procedure, it is common to have:  Blood-tinged urine.  Burning or soreness when you pass urine.  Follow these instructions at home:   Do not drive for 24 hours if you received a sedative.  Take over-the-counter and prescription medicines only as told by your health care provider.  If you were prescribed an antibiotic medicine, take it as told by your health care provider. Do not stop taking the antibiotic even if you start to feel better.  Drink enough fluid to keep your urine clear or pale yellow.  Return to your normal activities as told by your health care provider. Ask your health care provider what activities are safe for you.  Keep all follow-up visits as told by your health care provider. This is important. Contact a health care provider if:  You have a fever or chills.  You have blood-tinged urine for more than one day after your procedure.  You have worsening pain or burning when you pass urine.  You have pain or burning when passing urine for more than two days after your procedure.  You have trouble emptying your bladder. Get help right away if:  You have bright red blood in your urine.  You are unable to pass urine. This information is not intended to replace advice given to you by your health care provider. Make sure you discuss any questions you have with your health care provider. Document Released: 08/06/2015 Document Revised: 06/04/2016 Document Reviewed: 05/14/2015 Elsevier Interactive Patient Education  2018 [place] Anesthesia, Adult General anesthesia is the use of medicines to make a person "go to sleep" (be unconscious) for a medical procedure. General anesthesia is often recommended when a procedure:  Is long.  Requires you to be still or in an unusual  position.  Is major and can cause you to lose blood.  Is impossible to do without general anesthesia.  The medicines used for general anesthesia are called general anesthetics. In addition to making you sleep, the medicines:  Prevent pain.  Control your blood pressure.  Relax your muscles.  Tell a health care provider about:  Any allergies you have.  All medicines you are taking, including vitamins, herbs, eye drops, creams, and over-the-counter  medicines.  Any problems you or family members have had with anesthetic medicines.  Types of anesthetics you have had in the past.  Any bleeding disorders you have.  Any surgeries you have had.  Any medical conditions you have.  Any history of heart or lung conditions, such as heart failure, sleep apnea, or chronic obstructive pulmonary disease (COPD).  Whether you are pregnant or may be pregnant.  Whether you use tobacco, alcohol, marijuana, or street drugs.  Any history of Armed forces logistics/support/administrative officer.  Any history of depression or anxiety. What are the risks? Generally, this is a safe procedure. However, problems may occur, including:  Allergic reaction to anesthetics.  Lung and heart problems.  Inhaling food or liquids from your stomach into your lungs (aspiration).  Injury to nerves.  Waking up during your procedure and being unable to move (rare).  Extreme agitation or a state of mental confusion (delirium) when you wake up from the anesthetic.  Air in the bloodstream, which can lead to stroke.  These problems are more likely to develop if you are having a major surgery or if you have an advanced medical condition. You can prevent some of these complications by answering all of your health care provider's questions thoroughly and by following all pre-procedure instructions. General anesthesia can cause side effects, including:  Nausea or vomiting  A sore throat from the breathing tube.  Feeling cold or  shivery.  Feeling tired, washed out, or achy.  Sleepiness or drowsiness.  Confusion or agitation.  What happens before the procedure? Staying hydrated Follow instructions from your health care provider about hydration, which may include:  Up to 2 hours before the procedure - you may continue to drink clear liquids, such as water, clear fruit juice, black coffee, and plain tea.  Eating and drinking restrictions Follow instructions from your health care provider about eating and drinking, which may include:  8 hours before the procedure - stop eating heavy meals or foods such as meat, fried foods, or fatty foods.  6 hours before the procedure - stop eating light meals or foods, such as toast or cereal.  6 hours before the procedure - stop drinking milk or drinks that contain milk.  2 hours before the procedure - stop drinking clear liquids.  Medicines  Ask your health care provider about: ? Changing or stopping your regular medicines. This is especially important if you are taking diabetes medicines or blood thinners. ? Taking medicines such as aspirin and ibuprofen. These medicines can thin your blood. Do not take these medicines before your procedure if your health care provider instructs you not to. ? Taking new dietary supplements or medicines. Do not take these during the week before your procedure unless your health care provider approves them.  If you are told to take a medicine or to continue taking a medicine on the day of the procedure, take the medicine with sips of water. General instructions   Ask if you will be going home the same day, the following day, or after a longer hospital stay. ? Plan to have someone take you home. ? Plan to have someone stay with you for the first 24 hours after you leave the hospital or clinic.  For 3-6 weeks before the procedure, try not to use any tobacco products, such as cigarettes, chewing tobacco, and e-cigarettes.  You may brush  your teeth on the morning of the procedure, but make sure to spit out the toothpaste. What happens during the procedure?  You will be given anesthetics through a mask and through an IV tube in one of your veins.  You may receive medicine to help you relax (sedative).  As soon as you are asleep, a breathing tube may be used to help you breathe.  An anesthesia specialist will stay with you throughout the procedure. He or she will help keep you comfortable and safe by continuing to give you medicines and adjusting the amount of medicine that you get. He or she will also watch your blood pressure, pulse, and oxygen levels to make sure that the anesthetics do not cause any problems.  If a breathing tube was used to help you breathe, it will be removed before you wake up. The procedure may vary among health care providers and hospitals. What happens after the procedure?  You will wake up, often slowly, after the procedure is complete, usually in a recovery area.  Your blood pressure, heart rate, breathing rate, and blood oxygen level will be monitored until the medicines you were given have worn off.  You may be given medicine to help you calm down if you feel anxious or agitated.  If you will be going home the same day, your health care provider may check to make sure you can stand, drink, and urinate.  Your health care providers will treat your pain and side effects before you go home.  Do not drive for 24 hours if you received a sedative.  You may: ? Feel nauseous and vomit. ? Have a sore throat. ? Have mental slowness. ? Feel cold or shivery. ? Feel sleepy. ? Feel tired. ? Feel sore or achy, even in parts of your body where you did not have surgery. This information is not intended to replace advice given to you by your health care provider. Make sure you discuss any questions you have with your health care provider. Document Released: 02/22/2008 Document Revised: 04/27/2016  Document Reviewed: 10/30/2015 Elsevier Interactive Patient Education  2018 [place] Anesthesia, Adult, Care After These instructions provide you with information about caring for yourself after your procedure. Your health care provider may also give you more specific instructions. Your treatment has been planned according to current medical practices, but problems sometimes occur. Call your health care provider if you have any problems or questions after your procedure. What can I expect after the procedure? After the procedure, it is common to have:  Vomiting.  A sore throat.  Mental slowness.  It is common to feel:  Nauseous.  Cold or shivery.  Sleepy.  Tired.  Sore or achy, even in parts of your body where you did not have surgery.  Follow these instructions at home: For at least 24 hours after the procedure:  Do not: ? Participate in activities where you could fall or become injured. ? Drive. ? Use heavy machinery. ? Drink alcohol. ? Take sleeping pills or medicines that cause drowsiness. ? Make important decisions or sign legal documents. ? Take care of children on your own.  Rest. Eating and drinking  If you vomit, drink water, juice, or soup when you can drink without vomiting.  Drink enough fluid to keep your urine clear or pale yellow.  Make sure you have little or no nausea before eating solid foods.  Follow the diet recommended by your health care provider. General instructions  Have a responsible adult stay with you until you are awake and alert.  Return to your normal activities as told by your health  care provider. Ask your health care provider what activities are safe for you.  Take over-the-counter and prescription medicines only as told by your health care provider.  If you smoke, do not smoke without supervision.  Keep all follow-up visits as told by your health care provider. This is important. Contact a health care provider  if:  You continue to have nausea or vomiting at home, and medicines are not helpful.  You cannot drink fluids or start eating again.  You cannot urinate after 8-12 hours.  You develop a skin rash.  You have fever.  You have increasing redness at the site of your procedure. Get help right away if:  You have difficulty breathing.  You have chest pain.  You have unexpected bleeding.  You feel that you are having a life-threatening or urgent problem. This information is not intended to replace advice given to you by your health care provider. Make sure you discuss any questions you have with your health care provider. Document Released: 02/21/2001 Document Revised: 04/19/2016 Document Reviewed: 10/30/2015 Elsevier Interactive Patient Education  [employer].

## 2018-05-10 ENCOUNTER — Encounter (HOSPITAL_COMMUNITY): Payer: Self-pay

## 2018-05-10 ENCOUNTER — Encounter (HOSPITAL_COMMUNITY)
Admission: RE | Admit: 2018-05-10 | Discharge: 2018-05-10 | Disposition: A | Discharge status: Home / Self Care | Payer: Medicare HMO | Source: Ambulatory Visit | Attending: Urology | Admitting: Urology

## 2018-05-10 DIAGNOSIS — Z01812 Encounter for preprocedural laboratory examination: Secondary | ICD-10-CM | POA: Diagnosis not present

## 2018-05-10 DIAGNOSIS — E119 Type 2 diabetes mellitus without complications: Secondary | ICD-10-CM | POA: Diagnosis not present

## 2018-05-10 HISTORY — DX: Gastro-esophageal reflux disease without esophagitis: K21.9

## 2018-05-10 HISTORY — DX: Anxiety disorder, unspecified: F41.9

## 2018-05-10 LAB — BASIC METABOLIC PANEL
ANION GAP: 7 (ref 5–15)
BUN: 18 mg/dL (ref 6–20)
CALCIUM: 8.6 mg/dL — AB (ref 8.9–10.3)
CO2: 25 mmol/L (ref 22–32)
Chloride: 105 mmol/L (ref 101–111)
Creatinine, Ser: 1.51 mg/dL — ABNORMAL HIGH (ref 0.61–1.24)
GFR calc Af Amer: 47 mL/min — ABNORMAL LOW (ref >60)
GFR calc non Af Amer: 40 mL/min — ABNORMAL LOW (ref >60)
Glucose, Bld: 112 mg/dL — ABNORMAL HIGH (ref 65–99)
POTASSIUM: 4.3 mmol/L (ref 3.5–5.1)
Sodium: 137 mmol/L (ref 135–145)

## 2018-05-10 LAB — CBC WITH DIFFERENTIAL/PLATELET
BASOS ABS: 0 10*3/uL (ref 0.0–0.1)
Basophils Relative: 0 %
Eosinophils Absolute: 0.2 10*3/uL (ref 0.0–0.7)
Eosinophils Relative: 4 %
HEMATOCRIT: 35.3 % — AB (ref 39.0–52.0)
Hemoglobin: 11.2 g/dL — ABNORMAL LOW (ref 13.0–17.0)
LYMPHS PCT: 34 %
Lymphs Abs: 1.5 10*3/uL (ref 0.7–4.0)
MCH: 29.6 pg (ref 26.0–34.0)
MCHC: 31.7 g/dL (ref 30.0–36.0)
MCV: 93.1 fL (ref 78.0–100.0)
MONO ABS: 0.4 10*3/uL (ref 0.1–1.0)
Monocytes Relative: 9 %
NEUTROS ABS: 2.4 10*3/uL (ref 1.7–7.7)
Neutrophils Relative %: 53 %
PLATELETS: 155 10*3/uL (ref 150–400)
RBC: 3.79 MIL/uL — AB (ref 4.22–5.81)
RDW: 14.3 % (ref 11.5–15.5)
WBC: 4.5 10*3/uL (ref 4.0–10.5)

## 2018-05-10 LAB — GLUCOSE, CAPILLARY: Glucose-Capillary: 136 mg/dL — ABNORMAL HIGH (ref 65–99)

## 2018-05-10 LAB — HEMOGLOBIN A1C
Hgb A1c MFr Bld: 7.8 % — ABNORMAL HIGH (ref 4.8–5.6)
Mean Plasma Glucose: 177.16 mg/dL

## 2018-05-10 LAB — SURGICAL PCR SCREEN
MRSA, PCR: NEGATIVE
Staphylococcus aureus: NEGATIVE

## 2018-05-11 NOTE — Pre-Procedure Instructions (Signed)
HgbA1c routed to PCP. 

## 2018-05-12 MED ORDER — ONABOTULINUMTOXINA 100 UNITS IJ SOLR
100.0000 [IU] | Freq: Once | INTRAMUSCULAR | Status: AC
Start: 2018-05-15 — End: 2018-05-15
  Administered 2018-05-15: 100 [IU] via INTRAMUSCULAR
  Filled 2018-05-12: qty 100

## 2018-05-15 ENCOUNTER — Ambulatory Visit (HOSPITAL_COMMUNITY): Payer: Medicare HMO | Admitting: Anesthesiology

## 2018-05-15 ENCOUNTER — Ambulatory Visit (HOSPITAL_COMMUNITY)
Admission: RE | Admit: 2018-05-15 | Discharge: 2018-05-15 | Disposition: A | Discharge status: Home / Self Care | Payer: Medicare HMO | Source: Ambulatory Visit | Attending: Urology | Admitting: Urology

## 2018-05-15 ENCOUNTER — Encounter (HOSPITAL_COMMUNITY): Payer: Self-pay | Admitting: Anesthesiology

## 2018-05-15 ENCOUNTER — Encounter (HOSPITAL_COMMUNITY)
Admission: RE | Disposition: A | Discharge status: Home / Self Care | Payer: Self-pay | Source: Ambulatory Visit | Attending: Urology

## 2018-05-15 DIAGNOSIS — Z86711 Personal history of pulmonary embolism: Secondary | ICD-10-CM | POA: Diagnosis not present

## 2018-05-15 DIAGNOSIS — F419 Anxiety disorder, unspecified: Secondary | ICD-10-CM | POA: Diagnosis not present

## 2018-05-15 DIAGNOSIS — F0390 Unspecified dementia without behavioral disturbance: Secondary | ICD-10-CM | POA: Insufficient documentation

## 2018-05-15 DIAGNOSIS — E782 Mixed hyperlipidemia: Secondary | ICD-10-CM | POA: Diagnosis not present

## 2018-05-15 DIAGNOSIS — M1990 Unspecified osteoarthritis, unspecified site: Secondary | ICD-10-CM | POA: Insufficient documentation

## 2018-05-15 DIAGNOSIS — K219 Gastro-esophageal reflux disease without esophagitis: Secondary | ICD-10-CM | POA: Insufficient documentation

## 2018-05-15 DIAGNOSIS — Z888 Allergy status to other drugs, medicaments and biological substances status: Secondary | ICD-10-CM | POA: Insufficient documentation

## 2018-05-15 DIAGNOSIS — Z880 Allergy status to penicillin: Secondary | ICD-10-CM | POA: Insufficient documentation

## 2018-05-15 DIAGNOSIS — E119 Type 2 diabetes mellitus without complications: Secondary | ICD-10-CM | POA: Diagnosis not present

## 2018-05-15 DIAGNOSIS — N319 Neuromuscular dysfunction of bladder, unspecified: Secondary | ICD-10-CM | POA: Insufficient documentation

## 2018-05-15 DIAGNOSIS — I10 Essential (primary) hypertension: Secondary | ICD-10-CM | POA: Diagnosis not present

## 2018-05-15 DIAGNOSIS — Z881 Allergy status to other antibiotic agents status: Secondary | ICD-10-CM | POA: Diagnosis not present

## 2018-05-15 DIAGNOSIS — N400 Enlarged prostate without lower urinary tract symptoms: Secondary | ICD-10-CM | POA: Diagnosis not present

## 2018-05-15 DIAGNOSIS — N3281 Overactive bladder: Secondary | ICD-10-CM | POA: Diagnosis not present

## 2018-05-15 HISTORY — PX: CYSTOSCOPY WITH INJECTION: SHX1424

## 2018-05-15 LAB — GLUCOSE, CAPILLARY
GLUCOSE-CAPILLARY: 162 mg/dL — AB (ref 65–99)
GLUCOSE-CAPILLARY: 165 mg/dL — AB (ref 65–99)

## 2018-05-15 SURGERY — CYSTOSCOPY, WITH INJECTION OF BLADDER NECK OR BLADDER WALL
Anesthesia: General

## 2018-05-15 MED ORDER — PROPOFOL 10 MG/ML IV BOLUS
INTRAVENOUS | Status: DC | PRN
  Administered 2018-05-15 (×3): 10 mg via INTRAVENOUS

## 2018-05-15 MED ORDER — CIPROFLOXACIN IN D5W 400 MG/200ML IV SOLN
INTRAVENOUS | Status: AC
  Filled 2018-05-15: qty 200

## 2018-05-15 MED ORDER — SUGAMMADEX SODIUM 200 MG/2ML IV SOLN
INTRAVENOUS | Status: AC
  Filled 2018-05-15: qty 2

## 2018-05-15 MED ORDER — FENTANYL CITRATE (PF) 100 MCG/2ML IJ SOLN
25.0000 ug | INTRAMUSCULAR | Status: DC | PRN
  Administered 2018-05-15: 50 ug via INTRAVENOUS
  Filled 2018-05-15: qty 2

## 2018-05-15 MED ORDER — PROPOFOL 10 MG/ML IV BOLUS
INTRAVENOUS | Status: AC
  Filled 2018-05-15: qty 40

## 2018-05-15 MED ORDER — LIDOCAINE HCL URETHRAL/MUCOSAL 2 % EX GEL
CUTANEOUS | Status: AC
  Filled 2018-05-15: qty 10

## 2018-05-15 MED ORDER — LACTATED RINGERS IV SOLN
INTRAVENOUS | Status: DC
  Administered 2018-05-15: 1000 mL via INTRAVENOUS

## 2018-05-15 MED ORDER — FENTANYL CITRATE (PF) 100 MCG/2ML IJ SOLN
INTRAMUSCULAR | Status: AC
  Filled 2018-05-15: qty 2

## 2018-05-15 MED ORDER — CIPROFLOXACIN IN D5W 400 MG/200ML IV SOLN
400.0000 mg | Freq: Once | INTRAVENOUS | Status: AC
  Administered 2018-05-15: 400 mg via INTRAVENOUS

## 2018-05-15 MED ORDER — LACTATED RINGERS IV SOLN
INTRAVENOUS | Status: DC | PRN
  Administered 2018-05-15: 07:00:00 via INTRAVENOUS

## 2018-05-15 MED ORDER — PROPOFOL 500 MG/50ML IV EMUL
INTRAVENOUS | Status: DC | PRN
  Administered 2018-05-15: 25 ug/kg/min via INTRAVENOUS

## 2018-05-15 MED ORDER — HYDROCODONE-ACETAMINOPHEN 7.5-325 MG PO TABS: 1.0000 | ORAL_TABLET | Freq: Once | ORAL | Status: DC | PRN

## 2018-05-15 MED ORDER — WATER FOR IRRIGATION, STERILE IR SOLN
Status: DC | PRN
  Administered 2018-05-15: 500 mL

## 2018-05-15 MED ORDER — SODIUM CHLORIDE 0.9 % IJ SOLN
INTRAMUSCULAR | Status: AC
  Filled 2018-05-15: qty 20

## 2018-05-15 MED ORDER — CEFAZOLIN SODIUM-DEXTROSE 2-4 GM/100ML-% IV SOLN: 2.0000 g | INTRAVENOUS | Status: DC

## 2018-05-15 MED ORDER — FENTANYL CITRATE (PF) 100 MCG/2ML IJ SOLN
INTRAMUSCULAR | Status: DC | PRN
  Administered 2018-05-15: 25 ug via INTRAVENOUS

## 2018-05-15 MED ORDER — STERILE WATER FOR IRRIGATION IR SOLN
Status: DC | PRN
  Administered 2018-05-15: 3000 mL

## 2018-05-15 SURGICAL SUPPLY — 23 items
BAG DRAIN URO TABLE W/ADPT NS (DRAPE) ×3 IMPLANT
BAG DRN 8 ADPR NS SKTRN CSTL (DRAPE) ×1
CLOTH BEACON ORANGE TIMEOUT ST (SAFETY) ×3 IMPLANT
GLOVE BIO SURGEON STRL SZ 6.5 (GLOVE) ×2 IMPLANT
GLOVE BIO SURGEONS STRL SZ 6.5 (GLOVE) ×1
GLOVE BIOGEL M 8.0 STRL (GLOVE) ×3 IMPLANT
GLOVE BIOGEL PI IND STRL 6.5 (GLOVE) IMPLANT
GLOVE BIOGEL PI IND STRL 7.0 (GLOVE) ×2 IMPLANT
GLOVE BIOGEL PI INDICATOR 6.5 (GLOVE) ×2
GLOVE BIOGEL PI INDICATOR 7.0 (GLOVE) ×4
GOWN STRL REUS W/ TWL XL LVL3 (GOWN DISPOSABLE) ×1 IMPLANT
GOWN STRL REUS W/TWL XL LVL3 (GOWN DISPOSABLE) ×6 IMPLANT
KIT TURNOVER CYSTO (KITS) ×3 IMPLANT
MANIFOLD NEPTUNE II (INSTRUMENTS) ×3 IMPLANT
NDL ASPIRATION 22 (NEEDLE) ×1 IMPLANT
NDL HYPO 18GX1.5 BLUNT FILL (NEEDLE) ×1 IMPLANT
NEEDLE ASPIRATION 22 (NEEDLE) ×3 IMPLANT
NEEDLE HYPO 18GX1.5 BLUNT FILL (NEEDLE) ×3 IMPLANT
PACK CYSTO (CUSTOM PROCEDURE TRAY) ×3 IMPLANT
PAD ARMBOARD 7.5X6 YLW CONV (MISCELLANEOUS) ×3 IMPLANT
SYR CONTROL 10ML LL (SYRINGE) ×3 IMPLANT
TOWEL OR 17X26 4PK STRL BLUE (TOWEL DISPOSABLE) ×3 IMPLANT
WATER STERILE IRR 3000ML UROMA (IV SOLUTION) ×3 IMPLANT

## 2018-05-15 NOTE — H&P (Signed)
Urology Admission H&P  Chief Complaint: urinary urgency  History of Present Illness: Samuel Ryan is a 84yo with OAB refractory to medical therapy. He has failed anticholinergics and beta 3s. He has severe urgency, frequency and nocturia  Past Medical History:  Diagnosis Date  . Acute cholecystitis 11/2012   Drained percutaneously  . Anxiety   . BPH (benign prostatic hypertrophy)   . Deep venous thrombosis (HCC)    Left leg, diagnosed 7/12  . Dementia    Needs supervision for safety per son - POA  . Diverticulitis   . Essential hypertension, benign   . GERD (gastroesophageal reflux disease)   . History of pneumonia    Prior to 2012  . Incontinence of urine   . Mixed hyperlipidemia   . Osteoarthritis   . Pulmonary embolism ([place])    Diagnosed 7/12  . Type 2 diabetes mellitus ([place])    Past Surgical History:  Procedure Laterality Date  . CARPAL TUNNEL RELEASE    . COLONOSCOPY  08/31/2012   Procedure: COLONOSCOPY;  Surgeon: Rogene Houston, MD;  Location: [facility];  Service: Endoscopy;  Laterality: N/A;  830  . CYSTOSCOPY WITH INSERTION OF UROLIFT N/A 09/02/2016   Procedure: CYSTOSCOPY WITH INSERTION OF UROLIFT;  Surgeon: Cleon Gustin, MD;  Location: [facility];  Service: Urology;  Laterality: N/A;  . EYE SURGERY Bilateral    bilateral cataract  . Right total knee replacement  04/23/2011  . SPINE SURGERY  prior to 2012   Neck fusion  . UMBILICAL HERNIA REPAIR     umbilical    Home Medications:  Current Facility-Administered Medications  Medication Dose Route Frequency Provider Last Rate Last Dose  . botulinum toxin Type A (BOTOX) injection 100 Units  100 Units Intramuscular Once Cleon Gustin, MD      . ceFAZolin (ANCEF) IVPB 2g/100 mL premix  2 g Intravenous 30 min Pre-Op Zamir Staples, [name], MD      . lactated ringers infusion   Intravenous Continuous Lenice Llamas, MD 50 mL/hr at 05/15/18 0802 1,000 mL at 05/15/18 0802    Facility-Administered Medications Ordered in Other Encounters  Medication Dose Route Frequency Provider Last Rate Last Dose  . lactated ringers infusion    Continuous PRN Ollen Bowl, CRNA       Allergies:  Allergies  Allergen Reactions  . Zosyn [Piperacillin Sod-Tazobactam So] Anaphylaxis, Swelling and Other (See Comments)    Facial swelling Has patient had a PCN reaction causing immediate rash, facial/tongue/throat swelling, SOB or lightheadedness with hypotension: Yes Has patient had a PCN reaction causing severe rash involving mucus membranes or skin necrosis: No Has patient had a PCN reaction that required hospitalization: No Has patient had a PCN reaction occurring within the last 10 years: Yes If all of the above answers are "NO", then may proceed with Cephalosporin use.   . Ace Inhibitors Cough  . Vancomycin Swelling and Other (See Comments)    Facial swelling    Family History  Problem Relation Age of Onset  . Cancer Mother        Stomach  . Diabetes Sister   . Hypertension Sister   . Diabetes Brother   . Cancer Brother        Gallbladder  . Seizures Son        due to meningitis as a child   Social History:  reports that he has never smoked. He has never used smokeless tobacco. He reports that he does  not drink alcohol or use drugs.  Review of Systems  Genitourinary: Positive for frequency and urgency.  All other systems reviewed and are negative.   Physical Exam:  Vital signs in last 24 hours: Temp:  [98.2 F (36.8 C)] 98.2 F (36.8 C) (06/17 0648) Resp:  [20] 20 (06/17 0648) BP: (163)/(92) 163/92 (06/17 0648) SpO2:  [95 %] 95 % (06/17 7408) Physical Exam  Constitutional: He is oriented to person, place, and time. He appears well-developed and well-nourished.  HENT:  Head: Normocephalic and atraumatic.  Eyes: Pupils are equal, round, and reactive to light. EOM are normal.  Neck: Normal range of motion. No thyromegaly present.  Cardiovascular:  Normal rate and regular rhythm.  Respiratory: Effort normal. No respiratory distress.  GI: Soft. He exhibits no distension.  Musculoskeletal: Normal range of motion. He exhibits no edema.  Neurological: He is alert and oriented to person, place, and time.  Skin: Skin is warm and dry.  Psychiatric: He has a normal mood and affect. His behavior is normal. Judgment and thought content normal.    Laboratory Data:  Results for orders placed or performed during the hospital encounter of 05/15/18 (from the past 24 hour(s))  Glucose, capillary     Status: Abnormal   Collection Time: 05/15/18  6:51 AM  Result Value Ref Range   Glucose-Capillary 162 (H) 65 - 99 mg/dL   Recent Results (from the past 240 hour(s))  Surgical pcr screen     Status: None   Collection Time: 05/10/18  2:10 PM  Result Value Ref Range Status   MRSA, PCR NEGATIVE NEGATIVE Final   Staphylococcus aureus NEGATIVE NEGATIVE Final    Comment: (NOTE) The Xpert SA Assay (FDA approved for NASAL specimens in patients 1 years of age and older), is one component of a comprehensive surveillance program. It is not intended to diagnose infection nor to guide or monitor treatment. Performed at [facility], [address], [place], [place] [address]    Creatinine: Recent Labs    05/10/18 1410  CREATININE 1.51*   Baseline Creatinine: 1.5  Impression/Assessment:  84yo with OAB refractory to medical therapy  Plan:  The risks/benefits/alternatives to intravesical botox was explained to the patient and he understands and wishes to proceed with surgery  Nicolette Bang 05/15/2018, 8:03 AM

## 2018-05-15 NOTE — Op Note (Signed)
Preoperative diagnosis: neurogenic bladder  Postoperative diagnosis: same  Procedure: 1 cystoscopy 2. Intravesical botox injection 100 units  Attending: Nicolette Bang  Anesthesia: General  Estimated blood loss: Minimal  Drains: none  Specimens: none  Antibiotics: cipro  Findings:  Ureteral orifices in normal anatomic location.  No masses/lesions in the bladder  Indications: Patient is a 84 year old male with a history of OAB refractory to medical therapy.  After discussing treatment options, they decided proceed with intravesical botox injection.  Procedure her in detail: The patient was brought to the operating room and a brief timeout was done to ensure correct patient, correct procedure, correct site.  General anesthesia was administered patient was placed in dorsal lithotomy position.  Their genitalia was then prepped and draped in usual sterile fashion.  A rigid 11 French cystoscope was passed in the urethra and the bladder.  Bladder was inspected and we noted no masses or lesions.  the ureteral orifices were in the normal orthotopic locations. Using a deflux injection needle we proceed to inject 100 units in a grid pattern between the ureteral orifices starting at the trigone to the mid posterior wall. We injected a total of 20 sites with 5 units per injection. The bladder was then drained and this concluded the procedure which was well tolerated by patient.  Complications: None  Condition: Stable, extubated, transferred to PACU  Plan: Patient is to be discharge home. They will followup in 1 month

## 2018-05-15 NOTE — Discharge Instructions (Signed)
Botulinum Toxin Bladder Injection, Care After  Refer to this sheet in the next few weeks. These instructions provide you with information about caring for yourself after your procedure. Your health care provider may also give you more specific instructions. Your treatment has been planned according to current medical practices, but problems sometimes occur. Call your health care provider if you have any problems or questions after your procedure.  What can I expect after the procedure?  After the procedure, it is common to have:   Blood-tinged urine.   Burning or soreness when you pass urine.    Follow these instructions at home:     Do not drive for 24 hours if you received a sedative.   Take over-the-counter and prescription medicines only as told by your health care provider.   If you were prescribed an antibiotic medicine, take it as told by your health care provider. Do not stop taking the antibiotic even if you start to feel better.   Drink enough fluid to keep your urine clear or pale yellow.   Return to your normal activities as told by your health care provider. Ask your health care provider what activities are safe for you.   Keep all follow-up visits as told by your health care provider. This is important.  Contact a health care provider if:   You have a fever or chills.   You have blood-tinged urine for more than one day after your procedure.   You have worsening pain or burning when you pass urine.   You have pain or burning when passing urine for more than two days after your procedure.   You have trouble emptying your bladder.  Get help right away if:   You have bright red blood in your urine.   You are unable to pass urine.  This information is not intended to replace advice given to you by your health care provider. Make sure you discuss any questions you have with your health care provider.  Document Released: 08/06/2015 Document Revised: 06/04/2016 Document Reviewed:  05/14/2015  Elsevier Interactive Patient Education  2018 [employer]

## 2018-05-15 NOTE — Transfer of Care (Signed)
Immediate Anesthesia Transfer of Care Note  Patient: Samuel Ryan  Procedure(s) Performed: CYSTOSCOPY WITH BOTOX INJECTION (N/A )  Patient Location: PACU  Anesthesia Type:General  Level of Consciousness: awake and alert   Airway & Oxygen Therapy: Patient Spontanous Breathing and Patient connected to nasal cannula oxygen  Post-op Assessment: Report given to RN  Post vital signs: Reviewed and stable  Last Vitals:  Vitals Value Taken Time  BP 152/92 05/15/2018  8:44 AM  Temp 36.7 C 05/15/2018  8:44 AM  Pulse 70 05/15/2018  8:45 AM  Resp 22 05/15/2018  8:45 AM  SpO2 83 % 05/15/2018  8:45 AM  Vitals shown include unvalidated device data.  Last Pain:  Vitals:   05/15/18 0648  TempSrc: Oral  PainSc: 0-No pain      Patients Stated Pain Goal: 7 (89/38/10 1751)  Complications: No apparent anesthesia complications

## 2018-05-15 NOTE — Anesthesia Preprocedure Evaluation (Signed)
Anesthesia Evaluation  Patient identified by MRN, date of birth, ID band Patient awake    Reviewed: Allergy & Precautions, NPO status , Patient's Chart, lab work & pertinent test results  Airway Mallampati: II  TM Distance: >3 FB Neck ROM: Full    Dental no notable dental hx.    Pulmonary neg pulmonary ROS,    Pulmonary exam normal breath sounds clear to auscultation       Cardiovascular Exercise Tolerance: Poor hypertension, Pt. on medications negative cardio ROS Normal cardiovascular examII Rhythm:Regular Rate:Normal     Neuro/Psych PSYCHIATRIC DISORDERS Anxiety Dementia dementia negative neurological ROS  negative psych ROS   GI/Hepatic negative GI ROS, Neg liver ROS, GERD  Medicated,  Endo/Other  negative endocrine ROSdiabetes  Renal/GU Renal diseasenegative Renal ROS  negative genitourinary   Musculoskeletal negative musculoskeletal ROS (+) Arthritis , Osteoarthritis,    Abdominal   Peds negative pediatric ROS (+)  Hematology negative hematology ROS (+) anemia ,   Anesthesia Other Findings   Reproductive/Obstetrics negative OB ROS                             Anesthesia Physical Anesthesia Plan  ASA: III  Anesthesia Plan: General   Post-op Pain Management:    Induction: Intravenous  PONV Risk Score and Plan:   Airway Management Planned: Simple Face Mask  Additional Equipment:   Intra-op Plan:   Post-operative Plan: Extubation in OR  Informed Consent: I have reviewed the patients History and Physical, chart, labs and discussed the procedure including the risks, benefits and alternatives for the proposed anesthesia with the patient or authorized representative who has indicated his/her understanding and acceptance.   Dental advisory given  Plan Discussed with: CRNA  Anesthesia Plan Comments:         Anesthesia Quick Evaluation

## 2018-05-15 NOTE — Anesthesia Postprocedure Evaluation (Signed)
Anesthesia Post Note  Patient: Samuel Ryan  Procedure(s) Performed: CYSTOSCOPY WITH BOTOX INJECTION (N/A )  Patient location during evaluation: PACU Anesthesia Type: General Level of consciousness: awake and alert Pain management: pain level controlled Vital Signs Assessment: post-procedure vital signs reviewed and stable Respiratory status: spontaneous breathing Cardiovascular status: stable Postop Assessment: no apparent nausea or vomiting Anesthetic complications: no     Last Vitals:  Vitals:   05/15/18 0911 05/15/18 0915  BP:  (!) 176/94  Pulse:  72  Resp:  17  Temp:    SpO2: 98% 95%    Last Pain:  Vitals:   05/15/18 0900  TempSrc:   PainSc: 0-No pain                 Sevilla Murtagh

## 2018-05-16 ENCOUNTER — Encounter (HOSPITAL_COMMUNITY): Payer: Self-pay | Admitting: Urology

## 2018-05-17 ENCOUNTER — Telehealth: Payer: Self-pay | Admitting: Family Medicine

## 2018-05-17 NOTE — Telephone Encounter (Signed)
Patient had a procedure with Dr.Mckenzie on Monday, his blood sugar Monday was 176, today it is 206. Son is concerned. Please advise. Cb#: [id number]/ [id number]

## 2018-05-19 NOTE — Telephone Encounter (Signed)
Spoke with son and he stated that Samuel Ryan's BG's are now back down to around 114. No signs of infection but will keep a close watch on it. He said he called Dr.McKenzie and was advised to call PCP. Told son to keep close watch on urine over the weekend. If any issues please call or go to nearest ED for evaluation with verbal understanding.

## 2018-05-19 NOTE — Telephone Encounter (Signed)
Noted, thank you

## 2018-05-19 NOTE — Telephone Encounter (Signed)
pls advise him to increase the lantus he is giving hm to 23 units daily and also to notify Dr Alyson Ingles that the blood sugar has increased,keep an eye on his urine, because if it looks cloudy or has an odor he may be getting an infection and this would be a reason for the blood sugar to be increased and would need to be treated separately

## 2018-05-26 ENCOUNTER — Other Ambulatory Visit: Payer: Self-pay | Admitting: Family Medicine

## 2018-05-29 ENCOUNTER — Telehealth: Payer: Self-pay

## 2018-05-29 DIAGNOSIS — E785 Hyperlipidemia, unspecified: Secondary | ICD-10-CM

## 2018-05-29 DIAGNOSIS — IMO0002 Reserved for concepts with insufficient information to code with codable children: Secondary | ICD-10-CM

## 2018-05-29 DIAGNOSIS — E1129 Type 2 diabetes mellitus with other diabetic kidney complication: Secondary | ICD-10-CM

## 2018-05-29 DIAGNOSIS — E1165 Type 2 diabetes mellitus with hyperglycemia: Secondary | ICD-10-CM

## 2018-05-29 DIAGNOSIS — I10 Essential (primary) hypertension: Secondary | ICD-10-CM

## 2018-05-29 NOTE — Telephone Encounter (Signed)
Labs ordered.

## 2018-05-30 LAB — BASIC METABOLIC PANEL WITH GFR
BUN/Creatinine Ratio: 11 (calc) (ref 6–22)
BUN: 20 mg/dL (ref 7–25)
CALCIUM: 8.3 mg/dL — AB (ref 8.6–10.3)
CHLORIDE: 104 mmol/L (ref 98–110)
CO2: 27 mmol/L (ref 20–32)
Creat: 1.78 mg/dL — ABNORMAL HIGH (ref 0.70–1.11)
GFR, Est African American: 39 mL/min/{1.73_m2} — ABNORMAL LOW (ref >=60)
GFR, Est Non African American: 34 mL/min/{1.73_m2} — ABNORMAL LOW (ref >=60)
Glucose, Bld: 134 mg/dL — ABNORMAL HIGH (ref 65–99)
POTASSIUM: 4.5 mmol/L (ref 3.5–5.3)
Sodium: 138 mmol/L (ref 135–146)

## 2018-05-30 LAB — LIPID PANEL
Cholesterol: 140 mg/dL (ref <200)
HDL: 39 mg/dL — ABNORMAL LOW (ref >40)
LDL Cholesterol (Calc): 85 mg/dL (calc)
Non-HDL Cholesterol (Calc): 101 mg/dL (calc) (ref <130)
Total CHOL/HDL Ratio: 3.6 (calc) (ref <5.0)
Triglycerides: 74 mg/dL (ref <150)

## 2018-05-30 LAB — HEMOGLOBIN A1C
Hgb A1c MFr Bld: 8 % of total Hgb — ABNORMAL HIGH (ref <5.7)
Mean Plasma Glucose: 183 (calc)
eAG (mmol/L): 10.1 (calc)

## 2018-05-31 ENCOUNTER — Ambulatory Visit (INDEPENDENT_AMBULATORY_CARE_PROVIDER_SITE_OTHER): Payer: Medicare HMO | Admitting: Urology

## 2018-05-31 DIAGNOSIS — N401 Enlarged prostate with lower urinary tract symptoms: Secondary | ICD-10-CM

## 2018-05-31 DIAGNOSIS — R3915 Urgency of urination: Secondary | ICD-10-CM

## 2018-06-01 DIAGNOSIS — G308 Other Alzheimer's disease: Secondary | ICD-10-CM | POA: Diagnosis not present

## 2018-06-01 DIAGNOSIS — R0902 Hypoxemia: Secondary | ICD-10-CM | POA: Diagnosis not present

## 2018-06-01 DIAGNOSIS — F329 Major depressive disorder, single episode, unspecified: Secondary | ICD-10-CM | POA: Diagnosis not present

## 2018-06-01 DIAGNOSIS — E1129 Type 2 diabetes mellitus with other diabetic kidney complication: Secondary | ICD-10-CM | POA: Diagnosis not present

## 2018-06-06 ENCOUNTER — Encounter: Payer: Self-pay | Admitting: Family Medicine

## 2018-06-06 ENCOUNTER — Ambulatory Visit (INDEPENDENT_AMBULATORY_CARE_PROVIDER_SITE_OTHER): Payer: Medicare HMO | Admitting: Family Medicine

## 2018-06-06 VITALS — BP 138/80 | HR 63 | Resp 16 | Ht 70.0 in | Wt 261.0 lb

## 2018-06-06 DIAGNOSIS — Z794 Long term (current) use of insulin: Secondary | ICD-10-CM

## 2018-06-06 DIAGNOSIS — F0280 Dementia in other diseases classified elsewhere without behavioral disturbance: Secondary | ICD-10-CM | POA: Diagnosis not present

## 2018-06-06 DIAGNOSIS — G308 Other Alzheimer's disease: Secondary | ICD-10-CM | POA: Diagnosis not present

## 2018-06-06 DIAGNOSIS — Z9181 History of falling: Secondary | ICD-10-CM | POA: Diagnosis not present

## 2018-06-06 DIAGNOSIS — I10 Essential (primary) hypertension: Secondary | ICD-10-CM

## 2018-06-06 DIAGNOSIS — E119 Type 2 diabetes mellitus without complications: Secondary | ICD-10-CM | POA: Diagnosis not present

## 2018-06-06 DIAGNOSIS — N183 Chronic kidney disease, stage 3 unspecified: Secondary | ICD-10-CM

## 2018-06-06 DIAGNOSIS — IMO0001 Reserved for inherently not codable concepts without codable children: Secondary | ICD-10-CM

## 2018-06-06 MED ORDER — GLIPIZIDE ER 10 MG PO TB24: 10.0000 mg | ORAL_TABLET | Freq: Every day | ORAL | 3 refills | Status: DC

## 2018-06-06 MED ORDER — BENZONATATE 100 MG PO CAPS: 100.0000 mg | ORAL_CAPSULE | Freq: Four times a day (QID) | ORAL | 0 refills | Status: DC | PRN

## 2018-06-06 NOTE — Patient Instructions (Addendum)
Wellness visit with nurse in September needs flu vaccine at the visit  Annual physical exam   In 4 months  HBA1C, cmp and eGFR and Vit D  Be careful not to fall  Thank you  for choosing [facility]. We consider it a privelige to serve you.  Delivering excellent health care in a caring and  compassionate way is our goal.  Partnering with you,  so that together we can achieve this goal is our strategy.

## 2018-06-07 MED ORDER — GLUCOSE BLOOD VI STRP: ORAL_STRIP | 3 refills | Status: DC

## 2018-06-12 ENCOUNTER — Other Ambulatory Visit: Payer: Self-pay | Admitting: Family Medicine

## 2018-06-12 ENCOUNTER — Encounter: Payer: Self-pay | Admitting: Family Medicine

## 2018-06-12 NOTE — Assessment & Plan Note (Signed)
Controlled, no change in medication Samuel Ryan is reminded of the importance of commitment to daily physical activity for 30 minutes or more, as able and the need to limit carbohydrate intake to 30 to 60 grams per meal to help with blood sugar control.   The need to take medication as prescribed, test blood sugar as directed, and to call between visits if there is a concern that blood sugar is uncontrolled is also discussed.   Samuel Ryan is reminded of the importance of daily foot exam, annual eye examination, and good blood sugar, blood pressure and cholesterol control.  Diabetic Labs Latest Ref Rng & Units 05/29/2018 05/10/2018 01/18/2018 09/29/2017 09/19/2017  HbA1c <5.7 % of total Hgb 8.0(H) 7.8(H) 8.1(H) 9.2(H) -  Microalbumin Not estab mg/dL - - - - -  Micro/Creat Ratio <30 mcg/mg creat - - - - -  Chol <200 mg/dL 140 - - 147 -  HDL >40 mg/dL 39(L) - - 43 -  Calc LDL mg/dL (calc) 85 - - 81 -  Triglycerides <150 mg/dL 74 - - 133 -  Creatinine 0.70 - 1.11 mg/dL 1.78(H) 1.51(H) 1.74(H) 1.76(H) 1.62(H)   BP/Weight 06/06/2018 05/15/2018 05/10/2018 01/24/2018 12/12/2017 10/05/2017 68/10/7516  Systolic BP 001 749 449 675 916 384 665  Diastolic BP 80 93 85 82 80 80 87  Wt. (Lbs) 261 - 250 259 259 260 252.43  BMI 37.45 - 35.87 37.16 37.16 37.31 -   Foot/eye exam completion dates Latest Ref Rng & Units 01/24/2018 09/22/2015  Eye Exam No Retinopathy - No Retinopathy  Foot Form Completion - Done -

## 2018-06-12 NOTE — Assessment & Plan Note (Signed)
Stable on current medication , continue same 

## 2018-06-12 NOTE — Progress Notes (Signed)
Samuel Ryan     MRN: [id number]      DOB: 1931-09-10   HPI Mr. Arquette is here for follow up and re-evaluation of chronic medical conditions, medication management and review of any available recent lab and radiology data.  Preventive health is updated, specifically  Cancer screening and Immunization.   Recently had another procedure for urinary incontinence, not much benefit.. The PT denies any adverse reactions to current medications since the last visit.  Scab on left anterior shin was healing but recently traumatized when he hit the area in the grocery store accidentally a few days ago, no pus or bleeding currently, it is re healing well but family wants it checked Denies polyuria, polydipsia, blurred vision , or hypoglycemic episodes. ROS Denies recent fever or chills. Denies sinus pressure, nasal congestion, ear pain or sore throat. Denies chest congestion, productive cough or wheezing. Denies chest pains, palpitations and leg swelling Denies abdominal pain, nausea, vomiting,diarrhea or constipation.   Denies dysuria, . C/o chronic joint pain, swelling and limitation in mobility.Denies any fall since ;last visit uses assistive device Denies headaches, seizures,  Denies depression, anxiety or insomnia.  PE  BP 138/80   Pulse 63   Resp 16   Ht 5\' 10"  (1.778 m)   Wt 261 lb (118.4 kg)   SpO2 94%   BMI 37.45 kg/m   Patient alert  and in no cardiopulmonary distress.  HEENT: No facial asymetry, neck reduced though adequate t ROM no JVD, no mass.  Chest: Clear to auscultation bilaterally.Decreased air entrry  CVS: S1, S2 no murmurs, no S3.Regular rate.  ABD: Soft non tender.   Ext: No edema  MS: Decreased ROM spine, shoulders, hips and knees.  Skin: Intact, no  rash noted.Healed ulcer 1 cm on antepriores left shin  Psych: Good eye contact, normal affect. Memory impaired not anxious or depressed appearing.  CNS: CN 2-12 intact, power,  normal throughout.no focal  deficits noted.   Assessment & Plan  Essential hypertension Controlled, no change in medication DASH diet and commitment to daily physical activity for a minimum of 30 minutes discussed and encouraged, as a part of hypertension management. The importance of attaining a healthy weight is also discussed.  BP/Weight 06/06/2018 05/15/2018 05/10/2018 01/24/2018 12/12/2017 10/05/2017 32/95/1884  Systolic BP 166 063 016 010 932 355 732  Diastolic BP 80 93 85 82 80 80 87  Wt. (Lbs) 261 - 250 259 259 260 252.43  BMI 37.45 - 35.87 37.16 37.16 37.31 -       CKD (chronic kidney disease) stage 3, GFR 30-59 ml/min (HCC) Unchanged, encouraged to maintain good blood pressure and blood sugar control  Diabetes mellitus, insulin dependent (IDDM), controlled (HCC) Controlled, no change in medication Mr. Biehn is reminded of the importance of commitment to daily physical activity for 30 minutes or more, as able and the need to limit carbohydrate intake to 30 to 60 grams per meal to help with blood sugar control.   The need to take medication as prescribed, test blood sugar as directed, and to call between visits if there is a concern that blood sugar is uncontrolled is also discussed.   Mr. Briner is reminded of the importance of daily foot exam, annual eye examination, and good blood sugar, blood pressure and cholesterol control.  Diabetic Labs Latest Ref Rng & Units 05/29/2018 05/10/2018 01/18/2018 09/29/2017 09/19/2017  HbA1c <5.7 % of total Hgb 8.0(H) 7.8(H) 8.1(H) 9.2(H) -  Microalbumin Not estab mg/dL - - - - -  Micro/Creat Ratio <30 mcg/mg creat - - - - -  Chol <200 mg/dL 140 - - 147 -  HDL >40 mg/dL 39(L) - - 43 -  Calc LDL mg/dL (calc) 85 - - 81 -  Triglycerides <150 mg/dL 74 - - 133 -  Creatinine 0.70 - 1.11 mg/dL 1.78(H) 1.51(H) 1.74(H) 1.76(H) 1.62(H)   BP/Weight 06/06/2018 05/15/2018 05/10/2018 01/24/2018 12/12/2017 10/05/2017 18/56/3149  Systolic BP 702 637 858 850 277 412 878  Diastolic BP 80 93  85 82 80 80 87  Wt. (Lbs) 261 - 250 259 259 260 252.43  BMI 37.45 - 35.87 37.16 37.16 37.31 -   Foot/eye exam completion dates Latest Ref Rng & Units 01/24/2018 09/22/2015  Eye Exam No Retinopathy - No Retinopathy  Foot Form Completion - Done -        Alzheimer's dementia without behavioral disturbance Stable on current medication, continue same  At high risk for falls Home safety and fall risk reduction is reviewed briefly  Morbid obesity ([place]) Deteriorated. Patient re-educated about  the importance of commitment to a  minimum of 150 minutes of exercise per week.  The importance of healthy food choices with portion control discussed. Encouraged to start a food diary, count calories and to consider  joining a support group. Sample diet sheets offered. Goals set by the patient for the next several months.   Weight /BMI 06/06/2018 05/10/2018 01/24/2018  WEIGHT 261 lb 250 lb 259 lb  HEIGHT 5\' 10"  5\' 10"  5\' 10"   BMI 37.45 kg/m2 35.87 kg/m2 37.16 kg/m2

## 2018-06-12 NOTE — Assessment & Plan Note (Signed)
Home safety and fall risk reduction is reviewed briefly

## 2018-06-12 NOTE — Assessment & Plan Note (Signed)
Unchanged, encouraged to maintain good blood pressure and blood sugar control

## 2018-06-12 NOTE — Assessment & Plan Note (Signed)
Controlled, no change in medication DASH diet and commitment to daily physical activity for a minimum of 30 minutes discussed and encouraged, as a part of hypertension management. The importance of attaining a healthy weight is also discussed.  BP/Weight 06/06/2018 05/15/2018 05/10/2018 01/24/2018 12/12/2017 10/05/2017 56/31/4970  Systolic BP 263 785 885 027 741 287 867  Diastolic BP 80 93 85 82 80 80 87  Wt. (Lbs) 261 - 250 259 259 260 252.43  BMI 37.45 - 35.87 37.16 37.16 37.31 -

## 2018-06-12 NOTE — Assessment & Plan Note (Signed)
Deteriorated. Patient re-educated about  the importance of commitment to a  minimum of 150 minutes of exercise per week.  The importance of healthy food choices with portion control discussed. Encouraged to start a food diary, count calories and to consider  joining a support group. Sample diet sheets offered. Goals set by the patient for the next several months.   Weight /BMI 06/06/2018 05/10/2018 01/24/2018  WEIGHT 261 lb 250 lb 259 lb  HEIGHT 5\' 10"  5\' 10"  5\' 10"   BMI 37.45 kg/m2 35.87 kg/m2 37.16 kg/m2

## 2018-06-16 ENCOUNTER — Other Ambulatory Visit: Payer: Self-pay | Admitting: Family Medicine

## 2018-06-16 ENCOUNTER — Telehealth: Payer: Self-pay

## 2018-06-16 MED ORDER — TRAMADOL HCL 50 MG PO TABS: ORAL_TABLET | ORAL | 5 refills | Status: DC

## 2018-06-16 NOTE — Telephone Encounter (Signed)
I have sent in 1 month with 5 refills and spoken directly with the pharmacist also

## 2018-06-16 NOTE — Telephone Encounter (Signed)
Pharmacy called and stated that the refill request was denied for Tramadol and it stated that a new script was to follow and they never seen a new script come through. I see where a script was printed on 06/12/18 but I haven't faxed one and it isn't in the faxed box. Explained to the pharmacist they wouldn't get a response until Monday.

## 2018-07-02 DIAGNOSIS — R0902 Hypoxemia: Secondary | ICD-10-CM | POA: Diagnosis not present

## 2018-07-02 DIAGNOSIS — E1129 Type 2 diabetes mellitus with other diabetic kidney complication: Secondary | ICD-10-CM | POA: Diagnosis not present

## 2018-07-02 DIAGNOSIS — G308 Other Alzheimer's disease: Secondary | ICD-10-CM | POA: Diagnosis not present

## 2018-07-02 DIAGNOSIS — F329 Major depressive disorder, single episode, unspecified: Secondary | ICD-10-CM | POA: Diagnosis not present

## 2018-07-04 ENCOUNTER — Other Ambulatory Visit: Payer: Self-pay

## 2018-07-04 MED ORDER — FUROSEMIDE 20 MG PO TABS: 20.0000 mg | ORAL_TABLET | Freq: Every day | ORAL | 3 refills | Status: DC

## 2018-07-14 ENCOUNTER — Telehealth: Payer: Self-pay | Admitting: Family Medicine

## 2018-07-14 NOTE — Telephone Encounter (Signed)
Patient called in stating he did not use his oxygen more than 72min & that he needs Dr.Simpson to discontinue his order so they can come get it. Cb# [id number]/ [id number]

## 2018-07-16 NOTE — Telephone Encounter (Signed)
pls  Explain that he will need to be re tested for need while asleep as well as ambulating, as it is documented clearly in his record that it is medically necessary up to this point in time, so the fact that he does not use it is insufficient for me to d/c . If on re testing he medically no longer needs it , then and only then am I able to write the order

## 2018-07-18 ENCOUNTER — Telehealth: Payer: Self-pay | Admitting: Family Medicine

## 2018-07-18 NOTE — Telephone Encounter (Signed)
The son called and said that when the oxygen was first delivered his dad has a bad attitude about it and refused to wear it and he said his dad is in his 55's and he doesn't need the expense for something he is not using and ultimately its up to his father to use it or not. Does not want further testing to determine the need. He nor his father wants the oxygen.

## 2018-07-18 NOTE — Telephone Encounter (Signed)
D/c order sent  

## 2018-07-18 NOTE — Telephone Encounter (Signed)
See previous message sent to Dr Moshe Cipro

## 2018-07-18 NOTE — Telephone Encounter (Signed)
New Message  Pts son verbalized pt is not needing O2 machine anymore and he tried calling the company and the company told him Dr. Moshe Cipro will have to fax a dx form. Fax info is below:  [employer] [phone])  Pts son verbalized to call once it has been complete.

## 2018-07-18 NOTE — Telephone Encounter (Signed)
Noted , and this is documented in the medical record that the oxygen is refused,( though indicated)  pls send order to discontinue the oxygen and let them know

## 2018-08-01 ENCOUNTER — Encounter: Payer: Self-pay | Admitting: Family Medicine

## 2018-08-01 ENCOUNTER — Ambulatory Visit (INDEPENDENT_AMBULATORY_CARE_PROVIDER_SITE_OTHER): Payer: Medicare HMO | Admitting: Family Medicine

## 2018-08-01 ENCOUNTER — Telehealth: Payer: Self-pay | Admitting: Family Medicine

## 2018-08-01 ENCOUNTER — Ambulatory Visit (HOSPITAL_COMMUNITY)
Admission: RE | Admit: 2018-08-01 | Discharge: 2018-08-01 | Disposition: A | Discharge status: Home / Self Care | Payer: Medicare HMO | Source: Ambulatory Visit | Attending: Family Medicine | Admitting: Family Medicine

## 2018-08-01 VITALS — BP 120/70 | HR 76 | Resp 16 | Ht 70.0 in | Wt 257.0 lb

## 2018-08-01 DIAGNOSIS — R410 Disorientation, unspecified: Secondary | ICD-10-CM

## 2018-08-01 DIAGNOSIS — E119 Type 2 diabetes mellitus without complications: Secondary | ICD-10-CM

## 2018-08-01 DIAGNOSIS — IMO0001 Reserved for inherently not codable concepts without codable children: Secondary | ICD-10-CM

## 2018-08-01 DIAGNOSIS — N3000 Acute cystitis without hematuria: Secondary | ICD-10-CM | POA: Diagnosis not present

## 2018-08-01 DIAGNOSIS — E559 Vitamin D deficiency, unspecified: Secondary | ICD-10-CM

## 2018-08-01 DIAGNOSIS — J984 Other disorders of lung: Secondary | ICD-10-CM | POA: Diagnosis not present

## 2018-08-01 DIAGNOSIS — R350 Frequency of micturition: Secondary | ICD-10-CM

## 2018-08-01 DIAGNOSIS — J40 Bronchitis, not specified as acute or chronic: Secondary | ICD-10-CM

## 2018-08-01 DIAGNOSIS — Z794 Long term (current) use of insulin: Secondary | ICD-10-CM

## 2018-08-01 DIAGNOSIS — R05 Cough: Secondary | ICD-10-CM | POA: Diagnosis not present

## 2018-08-01 DIAGNOSIS — R0902 Hypoxemia: Secondary | ICD-10-CM | POA: Diagnosis not present

## 2018-08-01 DIAGNOSIS — J8410 Pulmonary fibrosis, unspecified: Secondary | ICD-10-CM | POA: Insufficient documentation

## 2018-08-01 DIAGNOSIS — G308 Other Alzheimer's disease: Secondary | ICD-10-CM

## 2018-08-01 DIAGNOSIS — F0280 Dementia in other diseases classified elsewhere without behavioral disturbance: Secondary | ICD-10-CM | POA: Diagnosis not present

## 2018-08-01 NOTE — Progress Notes (Signed)
   Samuel Ryan     MRN: [id number]      DOB: Nov 16, 1931   HPI Samuel Ryan is here with a 5 day h/o confusion , weakness, noted with imbalance and abnormal gait, no fall C/o dizziness and not feeling well, appetite is  Good, also increased tremor of hands noted causing difficulty with feeding Blood sugars are being tested and are within normal Urination does not seem abnormal , no recent h/o foley cathter placement , no fever or chills, no foul smell to the urine Not using nocturnal oxygen, however he is at increased fall risk the way the oxygen supply would need to be run in the home , and his son is afraid he may fall in the night , as he is now working a night job, has no help with him  ROS Denies recent fever or chills. Denies sinus pressure, nasal congestion, ear pain or sore throat. Denies chest congestion, productive cough or wheezing. Denies chest pains, palpitations and leg swelling Denies abdominal pain, nausea, vomiting,diarrhea or constipation.    C/o chronic  joint pain, swelling and limitation in mobility. Denies headaches, seizures,  Denies uncontrolled depression, anxiety or insomnia. Denies skin break down or rash.   PE  BP 138/80   Pulse 76   Resp 16   Ht 5\' 10"  (1.778 m)   Wt 257 lb (116.6 kg)   SpO2 (!) 89%   BMI 36.88 kg/m   Patient alert and in no cardiopulmonary distress.dispriented as far as year is concerned , and less interactive at this visit  HEENT: No facial asymmetry, EOMI,   oropharynx pink and moist.  Neck decreased ROM no JVD, no mass.  Chest: Clear to auscultation bilaterally.Decreased  Though adequate air entry  CVS: S1, S2 no murmurs, no S3.Regular rate.  ABD: Soft non tender.   Ext: No edema  MS: decreased  ROM spine, shoulders, hips and knees.  Skin: Intact, no ulcerations or rash noted.  Psych: Good eye contact, flat affect. Memory loss depressed appearing.  CNS: CN 2-12 intact, power,  normal throughout.no focal deficits  noted.   Assessment & Plan  Hypoxia Increased cough and chest congestion x 5 days, pulse ox initially was 89 % but rose to 95 %after sitting for a while CXR today. Needs oxygen, is requesting a small tank to take around , will try to work on obtaining this. Nocturnal oxygen and home safety are currently a problem  Alzheimer's dementia without behavioral disturbance Appears to be worsening, pot was unable to give appropriate year at this visit, need updated MMSE at next visit.  New onset of confusion  5 day h/o increased confusion and tremor with weakness, obtain cbc, chem 7 and screen for UTI. pt is afebrile, and a  poor historian   Urinary frequency Acute confusion and weakness with h/o UTI, check for infection

## 2018-08-01 NOTE — Assessment & Plan Note (Addendum)
Increased cough and chest congestion x 5 days, pulse ox initially was 89 % but rose to 95 %after sitting for a while CXR today. Needs oxygen, is requesting a small tank to take around , will try to work on obtaining this. Nocturnal oxygen and home safety are currently a problem

## 2018-08-01 NOTE — Assessment & Plan Note (Signed)
5 day h/o increased confusion and tremor with weakness, obtain cbc, chem 7 and screen for UTI. pt is afebrile, and a  poor historian

## 2018-08-01 NOTE — Telephone Encounter (Signed)
Please try to get lightweight portable oxygen tank for pt . We have documentation that he de sats with activity in office note of 08/01/2018, he gave the impression he will use a light tank. Please verify with his son that they DO want to do this before moving forward with this plan

## 2018-08-01 NOTE — Assessment & Plan Note (Signed)
Acute confusion and weakness with h/o UTI, check for infection

## 2018-08-01 NOTE — Assessment & Plan Note (Signed)
Appears to be worsening, pot was unable to give appropriate year at this visit, need updated MMSE at next visit.

## 2018-08-01 NOTE — Patient Instructions (Addendum)
Keep Novemebr appointment as before  CXR todaty  Please bring urine to be sent for  C/S    Labs today cBC, cmp and EGFR and Vitamin D  Non fast hBA1C for Nov visit, chem 7 and eGFR   Careful not to fall

## 2018-08-02 ENCOUNTER — Other Ambulatory Visit: Payer: Self-pay | Admitting: Family Medicine

## 2018-08-02 DIAGNOSIS — N183 Chronic kidney disease, stage 3 unspecified: Secondary | ICD-10-CM

## 2018-08-02 DIAGNOSIS — F329 Major depressive disorder, single episode, unspecified: Secondary | ICD-10-CM | POA: Diagnosis not present

## 2018-08-02 DIAGNOSIS — R0902 Hypoxemia: Secondary | ICD-10-CM | POA: Diagnosis not present

## 2018-08-02 DIAGNOSIS — G308 Other Alzheimer's disease: Secondary | ICD-10-CM | POA: Diagnosis not present

## 2018-08-02 DIAGNOSIS — E1129 Type 2 diabetes mellitus with other diabetic kidney complication: Secondary | ICD-10-CM | POA: Diagnosis not present

## 2018-08-02 DIAGNOSIS — N3000 Acute cystitis without hematuria: Secondary | ICD-10-CM | POA: Diagnosis not present

## 2018-08-02 LAB — COMPLETE METABOLIC PANEL WITH GFR
AG RATIO: 1.2 (calc) (ref 1.0–2.5)
ALT: 9 U/L (ref 9–46)
AST: 13 U/L (ref 10–35)
Albumin: 3.7 g/dL (ref 3.6–5.1)
Alkaline phosphatase (APISO): 64 U/L (ref 40–115)
BUN/Creatinine Ratio: 10 (calc) (ref 6–22)
BUN: 20 mg/dL (ref 7–25)
CALCIUM: 8.6 mg/dL (ref 8.6–10.3)
CO2: 27 mmol/L (ref 20–32)
Chloride: 104 mmol/L (ref 98–110)
Creat: 1.98 mg/dL — ABNORMAL HIGH (ref 0.70–1.11)
GFR, EST NON AFRICAN AMERICAN: 30 mL/min/{1.73_m2} — AB (ref >=60)
GFR, Est African American: 35 mL/min/{1.73_m2} — ABNORMAL LOW (ref >=60)
GLOBULIN: 3.2 g/dL (ref 1.9–3.7)
Glucose, Bld: 91 mg/dL (ref 65–139)
POTASSIUM: 4.7 mmol/L (ref 3.5–5.3)
SODIUM: 137 mmol/L (ref 135–146)
Total Bilirubin: 0.7 mg/dL (ref 0.2–1.2)
Total Protein: 6.9 g/dL (ref 6.1–8.1)

## 2018-08-02 LAB — CBC
HEMATOCRIT: 36.4 % — AB (ref 38.5–50.0)
Hemoglobin: 11.9 g/dL — ABNORMAL LOW (ref 13.2–17.1)
MCH: 30.3 pg (ref 27.0–33.0)
MCHC: 32.7 g/dL (ref 32.0–36.0)
MCV: 92.6 fL (ref 80.0–100.0)
MPV: 9.6 fL (ref 7.5–12.5)
Platelets: 175 10*3/uL (ref 140–400)
RBC: 3.93 10*6/uL — ABNORMAL LOW (ref 4.20–5.80)
RDW: 13.5 % (ref 11.0–15.0)
WBC: 5 10*3/uL (ref 3.8–10.8)

## 2018-08-02 LAB — VITAMIN D 25 HYDROXY (VIT D DEFICIENCY, FRACTURES): Vit D, 25-Hydroxy: 35 ng/mL (ref 30–100)

## 2018-08-02 NOTE — Addendum Note (Signed)
Addended by: Eual Fines on: 08/02/2018 09:20 AM   Modules accepted: Orders

## 2018-08-03 ENCOUNTER — Emergency Department (HOSPITAL_COMMUNITY): Payer: Medicare HMO

## 2018-08-03 ENCOUNTER — Other Ambulatory Visit: Payer: Self-pay

## 2018-08-03 ENCOUNTER — Encounter (HOSPITAL_COMMUNITY): Payer: Self-pay

## 2018-08-03 ENCOUNTER — Telehealth: Payer: Self-pay | Admitting: Family Medicine

## 2018-08-03 ENCOUNTER — Observation Stay (HOSPITAL_COMMUNITY)
Admission: EM | Admit: 2018-08-03 | Discharge: 2018-08-08 | Disposition: A | Discharge status: Home / Self Care | Payer: Medicare HMO | Attending: Internal Medicine | Admitting: Internal Medicine

## 2018-08-03 DIAGNOSIS — E1122 Type 2 diabetes mellitus with diabetic chronic kidney disease: Secondary | ICD-10-CM | POA: Diagnosis not present

## 2018-08-03 DIAGNOSIS — M6282 Rhabdomyolysis: Secondary | ICD-10-CM | POA: Diagnosis not present

## 2018-08-03 DIAGNOSIS — N183 Chronic kidney disease, stage 3 unspecified: Secondary | ICD-10-CM | POA: Diagnosis present

## 2018-08-03 DIAGNOSIS — G309 Alzheimer's disease, unspecified: Secondary | ICD-10-CM

## 2018-08-03 DIAGNOSIS — G308 Other Alzheimer's disease: Secondary | ICD-10-CM | POA: Insufficient documentation

## 2018-08-03 DIAGNOSIS — N400 Enlarged prostate without lower urinary tract symptoms: Secondary | ICD-10-CM | POA: Diagnosis not present

## 2018-08-03 DIAGNOSIS — F0280 Dementia in other diseases classified elsewhere without behavioral disturbance: Secondary | ICD-10-CM | POA: Diagnosis present

## 2018-08-03 DIAGNOSIS — R531 Weakness: Secondary | ICD-10-CM | POA: Diagnosis not present

## 2018-08-03 DIAGNOSIS — R079 Chest pain, unspecified: Secondary | ICD-10-CM | POA: Diagnosis not present

## 2018-08-03 DIAGNOSIS — Z79899 Other long term (current) drug therapy: Secondary | ICD-10-CM | POA: Insufficient documentation

## 2018-08-03 DIAGNOSIS — E559 Vitamin D deficiency, unspecified: Secondary | ICD-10-CM | POA: Diagnosis present

## 2018-08-03 DIAGNOSIS — S299XXA Unspecified injury of thorax, initial encounter: Secondary | ICD-10-CM | POA: Diagnosis not present

## 2018-08-03 DIAGNOSIS — R338 Other retention of urine: Secondary | ICD-10-CM

## 2018-08-03 DIAGNOSIS — S0990XA Unspecified injury of head, initial encounter: Secondary | ICD-10-CM | POA: Diagnosis not present

## 2018-08-03 DIAGNOSIS — Z794 Long term (current) use of insulin: Secondary | ICD-10-CM | POA: Diagnosis not present

## 2018-08-03 DIAGNOSIS — R4182 Altered mental status, unspecified: Secondary | ICD-10-CM | POA: Diagnosis present

## 2018-08-03 DIAGNOSIS — IMO0002 Reserved for concepts with insufficient information to code with codable children: Secondary | ICD-10-CM

## 2018-08-03 DIAGNOSIS — I129 Hypertensive chronic kidney disease with stage 1 through stage 4 chronic kidney disease, or unspecified chronic kidney disease: Secondary | ICD-10-CM | POA: Diagnosis not present

## 2018-08-03 DIAGNOSIS — N401 Enlarged prostate with lower urinary tract symptoms: Secondary | ICD-10-CM | POA: Diagnosis not present

## 2018-08-03 DIAGNOSIS — M25461 Effusion, right knee: Secondary | ICD-10-CM | POA: Diagnosis not present

## 2018-08-03 DIAGNOSIS — I10 Essential (primary) hypertension: Secondary | ICD-10-CM | POA: Diagnosis not present

## 2018-08-03 DIAGNOSIS — E785 Hyperlipidemia, unspecified: Secondary | ICD-10-CM | POA: Diagnosis present

## 2018-08-03 DIAGNOSIS — E1121 Type 2 diabetes mellitus with diabetic nephropathy: Secondary | ICD-10-CM

## 2018-08-03 DIAGNOSIS — R102 Pelvic and perineal pain: Secondary | ICD-10-CM | POA: Diagnosis not present

## 2018-08-03 DIAGNOSIS — S3993XA Unspecified injury of pelvis, initial encounter: Secondary | ICD-10-CM | POA: Diagnosis not present

## 2018-08-03 DIAGNOSIS — E1165 Type 2 diabetes mellitus with hyperglycemia: Secondary | ICD-10-CM

## 2018-08-03 DIAGNOSIS — R32 Unspecified urinary incontinence: Secondary | ICD-10-CM | POA: Diagnosis present

## 2018-08-03 DIAGNOSIS — R4020 Unspecified coma: Secondary | ICD-10-CM | POA: Diagnosis not present

## 2018-08-03 DIAGNOSIS — S199XXA Unspecified injury of neck, initial encounter: Secondary | ICD-10-CM | POA: Diagnosis not present

## 2018-08-03 LAB — VITAMIN B12: VITAMIN B 12: 242 pg/mL (ref 180–914)

## 2018-08-03 LAB — HEPATIC FUNCTION PANEL
ALT: 18 U/L (ref 0–44)
AST: 34 U/L (ref 15–41)
Albumin: 3.7 g/dL (ref 3.5–5.0)
Alkaline Phosphatase: 63 U/L (ref 38–126)
BILIRUBIN DIRECT: 0.2 mg/dL (ref 0.0–0.2)
BILIRUBIN INDIRECT: 1.2 mg/dL — AB (ref 0.3–0.9)
Total Bilirubin: 1.4 mg/dL — ABNORMAL HIGH (ref 0.3–1.2)
Total Protein: 8 g/dL (ref 6.5–8.1)

## 2018-08-03 LAB — TSH: TSH: 0.562 u[IU]/mL (ref 0.350–4.500)

## 2018-08-03 LAB — CBC WITH DIFFERENTIAL/PLATELET
BASOS ABS: 0 10*3/uL (ref 0.0–0.1)
BASOS PCT: 0 %
Eosinophils Absolute: 0 10*3/uL (ref 0.0–0.7)
Eosinophils Relative: 0 %
HEMATOCRIT: 38.1 % — AB (ref 39.0–52.0)
Hemoglobin: 12.5 g/dL — ABNORMAL LOW (ref 13.0–17.0)
Lymphocytes Relative: 14 %
Lymphs Abs: 1.1 10*3/uL (ref 0.7–4.0)
MCH: 30.8 pg (ref 26.0–34.0)
MCHC: 32.8 g/dL (ref 30.0–36.0)
MCV: 93.8 fL (ref 78.0–100.0)
MONO ABS: 0.6 10*3/uL (ref 0.1–1.0)
Monocytes Relative: 8 %
NEUTROS ABS: 6 10*3/uL (ref 1.7–7.7)
Neutrophils Relative %: 78 %
PLATELETS: 173 10*3/uL (ref 150–400)
RBC: 4.06 MIL/uL — AB (ref 4.22–5.81)
RDW: 14.2 % (ref 11.5–15.5)
WBC: 7.7 10*3/uL (ref 4.0–10.5)

## 2018-08-03 LAB — URINALYSIS, ROUTINE W REFLEX MICROSCOPIC
Bacteria, UA: NONE SEEN
Bilirubin Urine: NEGATIVE
GLUCOSE, UA: NEGATIVE mg/dL
KETONES UR: NEGATIVE mg/dL
LEUKOCYTES UA: NEGATIVE
NITRITE: NEGATIVE
PROTEIN: 30 mg/dL — AB
Specific Gravity, Urine: 1.019 (ref 1.005–1.030)
pH: 5 (ref 5.0–8.0)

## 2018-08-03 LAB — URINE CULTURE
MICRO NUMBER: 91055787
SPECIMEN QUALITY: ADEQUATE

## 2018-08-03 LAB — BASIC METABOLIC PANEL
ANION GAP: 8 (ref 5–15)
BUN: 23 mg/dL (ref 8–23)
CALCIUM: 9.1 mg/dL (ref 8.9–10.3)
CO2: 26 mmol/L (ref 22–32)
Chloride: 105 mmol/L (ref 98–111)
Creatinine, Ser: 2.09 mg/dL — ABNORMAL HIGH (ref 0.61–1.24)
GFR calc Af Amer: 32 mL/min — ABNORMAL LOW (ref >60)
GFR, EST NON AFRICAN AMERICAN: 27 mL/min — AB (ref >60)
Glucose, Bld: 173 mg/dL — ABNORMAL HIGH (ref 70–99)
Potassium: 4.4 mmol/L (ref 3.5–5.1)
Sodium: 139 mmol/L (ref 135–145)

## 2018-08-03 LAB — HEMOGLOBIN A1C
Hgb A1c MFr Bld: 8 % — ABNORMAL HIGH (ref 4.8–5.6)
Mean Plasma Glucose: 182.9 mg/dL

## 2018-08-03 LAB — CK: Total CK: 978 U/L — ABNORMAL HIGH (ref 49–397)

## 2018-08-03 LAB — GLUCOSE, CAPILLARY: GLUCOSE-CAPILLARY: 191 mg/dL — AB (ref 70–99)

## 2018-08-03 MED ORDER — INSULIN GLARGINE 100 UNIT/ML SOLOSTAR PEN: 20.0000 [IU] | PEN_INJECTOR | Freq: Every day | SUBCUTANEOUS | Status: DC | PRN

## 2018-08-03 MED ORDER — ONDANSETRON HCL 4 MG/2ML IJ SOLN: 4.0000 mg | Freq: Four times a day (QID) | INTRAMUSCULAR | Status: DC | PRN

## 2018-08-03 MED ORDER — ACETAMINOPHEN 325 MG PO TABS
650.0000 mg | ORAL_TABLET | Freq: Four times a day (QID) | ORAL | Status: DC | PRN
  Administered 2018-08-03 – 2018-08-08 (×4): 650 mg via ORAL
  Filled 2018-08-03 (×4): qty 2

## 2018-08-03 MED ORDER — SODIUM CHLORIDE 0.9 % IV BOLUS
1000.0000 mL | Freq: Once | INTRAVENOUS | Status: AC
  Administered 2018-08-03: 1000 mL via INTRAVENOUS

## 2018-08-03 MED ORDER — ENOXAPARIN SODIUM 30 MG/0.3ML ~~LOC~~ SOLN: 30.0000 mg | SUBCUTANEOUS | Status: DC

## 2018-08-03 MED ORDER — INSULIN GLARGINE 100 UNIT/ML ~~LOC~~ SOLN
20.0000 [IU] | Freq: Every day | SUBCUTANEOUS | Status: DC
  Administered 2018-08-03 – 2018-08-06 (×4): 20 [IU] via SUBCUTANEOUS
  Filled 2018-08-03 (×6): qty 0.2

## 2018-08-03 MED ORDER — ACETAMINOPHEN 650 MG RE SUPP: 650.0000 mg | Freq: Four times a day (QID) | RECTAL | Status: DC | PRN

## 2018-08-03 MED ORDER — INSULIN ASPART 100 UNIT/ML ~~LOC~~ SOLN: 0.0000 [IU] | Freq: Every day | SUBCUTANEOUS | Status: DC

## 2018-08-03 MED ORDER — FERROUS SULFATE 325 (65 FE) MG PO TABS
325.0000 mg | ORAL_TABLET | Freq: Two times a day (BID) | ORAL | Status: DC
  Administered 2018-08-03 – 2018-08-08 (×10): 325 mg via ORAL
  Filled 2018-08-03 (×10): qty 1

## 2018-08-03 MED ORDER — FUROSEMIDE 20 MG PO TABS
20.0000 mg | ORAL_TABLET | Freq: Every day | ORAL | Status: DC
  Administered 2018-08-03 – 2018-08-08 (×6): 20 mg via ORAL
  Filled 2018-08-03 (×6): qty 1

## 2018-08-03 MED ORDER — INSULIN ASPART 100 UNIT/ML ~~LOC~~ SOLN
0.0000 [IU] | Freq: Three times a day (TID) | SUBCUTANEOUS | Status: DC
  Administered 2018-08-04: 3 [IU] via SUBCUTANEOUS
  Administered 2018-08-04: 2 [IU] via SUBCUTANEOUS
  Administered 2018-08-05: 3 [IU] via SUBCUTANEOUS
  Administered 2018-08-08: 2 [IU] via SUBCUTANEOUS

## 2018-08-03 MED ORDER — SODIUM CHLORIDE 0.9 % IV SOLN
INTRAVENOUS | Status: DC
  Administered 2018-08-03 – 2018-08-07 (×6): via INTRAVENOUS

## 2018-08-03 MED ORDER — SENNOSIDES-DOCUSATE SODIUM 8.6-50 MG PO TABS: 1.0000 | ORAL_TABLET | Freq: Every evening | ORAL | Status: DC | PRN

## 2018-08-03 MED ORDER — TROSPIUM CHLORIDE ER 60 MG PO CP24: 60.0000 mg | ORAL_CAPSULE | Freq: Every day | ORAL | Status: DC

## 2018-08-03 MED ORDER — PANTOPRAZOLE SODIUM 40 MG PO TBEC
40.0000 mg | DELAYED_RELEASE_TABLET | Freq: Every day | ORAL | Status: DC
  Administered 2018-08-03 – 2018-08-08 (×6): 40 mg via ORAL
  Filled 2018-08-03 (×6): qty 1

## 2018-08-03 MED ORDER — INSULIN ASPART 100 UNIT/ML ~~LOC~~ SOLN
4.0000 [IU] | Freq: Three times a day (TID) | SUBCUTANEOUS | Status: DC
  Administered 2018-08-04 – 2018-08-07 (×6): 4 [IU] via SUBCUTANEOUS

## 2018-08-03 MED ORDER — LORATADINE 10 MG PO TABS
10.0000 mg | ORAL_TABLET | Freq: Every morning | ORAL | Status: DC
  Administered 2018-08-04 – 2018-08-08 (×5): 10 mg via ORAL
  Filled 2018-08-03 (×5): qty 1

## 2018-08-03 MED ORDER — PRAVASTATIN SODIUM 40 MG PO TABS
40.0000 mg | ORAL_TABLET | Freq: Every day | ORAL | Status: DC
  Administered 2018-08-03 – 2018-08-08 (×6): 40 mg via ORAL
  Filled 2018-08-03 (×6): qty 1

## 2018-08-03 MED ORDER — DONEPEZIL HCL 5 MG PO TABS
10.0000 mg | ORAL_TABLET | Freq: Every day | ORAL | Status: DC
  Administered 2018-08-03 – 2018-08-07 (×5): 10 mg via ORAL
  Filled 2018-08-03 (×5): qty 2

## 2018-08-03 MED ORDER — SERTRALINE HCL 50 MG PO TABS
50.0000 mg | ORAL_TABLET | Freq: Every day | ORAL | Status: DC
  Administered 2018-08-03 – 2018-08-08 (×6): 50 mg via ORAL
  Filled 2018-08-03 (×6): qty 1

## 2018-08-03 MED ORDER — ENOXAPARIN SODIUM 40 MG/0.4ML ~~LOC~~ SOLN
40.0000 mg | SUBCUTANEOUS | Status: DC
  Administered 2018-08-03 – 2018-08-08 (×6): 40 mg via SUBCUTANEOUS
  Filled 2018-08-03 (×6): qty 0.4

## 2018-08-03 MED ORDER — ONDANSETRON HCL 4 MG PO TABS: 4.0000 mg | ORAL_TABLET | Freq: Four times a day (QID) | ORAL | Status: DC | PRN

## 2018-08-03 MED ORDER — TAMSULOSIN HCL 0.4 MG PO CAPS
0.4000 mg | ORAL_CAPSULE | Freq: Every day | ORAL | Status: DC
  Administered 2018-08-03 – 2018-08-08 (×6): 0.4 mg via ORAL
  Filled 2018-08-03 (×6): qty 1

## 2018-08-03 MED ORDER — OXYBUTYNIN CHLORIDE ER 5 MG PO TB24
5.0000 mg | ORAL_TABLET | Freq: Every morning | ORAL | Status: DC
  Administered 2018-08-04 – 2018-08-08 (×5): 5 mg via ORAL
  Filled 2018-08-03 (×5): qty 1

## 2018-08-03 NOTE — ED Notes (Signed)
Pt did not ambulated well. Was very weak and could only transfer from bed to bedside toilet.

## 2018-08-03 NOTE — ED Provider Notes (Signed)
[facility] EMERGENCY DEPARTMENT Provider Note   CSN: [id number] Arrival date & time: 08/03/18  0920     History   Chief Complaint Chief Complaint  Patient presents with  . Altered Mental Status    HPI Samuel MESSER is a 84 y.o. male.  Patient fell on the floor last night and was unable to get up.  His son found him at home.  His son also states that he has been more confused lately  The history is provided by the patient and a relative. No language interpreter was used.  Weakness  Primary symptoms include no focal weakness. This is a new problem. The current episode started 6 to 12 hours ago. The problem has been resolved. There was no focality noted. There has been no fever. Pertinent negatives include no shortness of breath, no chest pain and no headaches. There were no medications administered prior to arrival. Associated medical issues include trauma.    Past Medical History:  Diagnosis Date  . Acute cholecystitis 11/2012   Drained percutaneously  . Anxiety   . BPH (benign prostatic hypertrophy)   . Deep venous thrombosis (HCC)    Left leg, diagnosed 7/12  . Dementia    Needs supervision for safety per son - POA  . Diverticulitis   . Essential hypertension, benign   . GERD (gastroesophageal reflux disease)   . History of pneumonia    Prior to 2012  . Incontinence of urine   . Mixed hyperlipidemia   . Osteoarthritis   . Pulmonary embolism ([place])    Diagnosed 7/12  . Type 2 diabetes mellitus [facility])     Patient Active Problem List   Diagnosis Date Noted  . Rhabdomyolysis 08/03/2018  . New onset of confusion 08/01/2018  . Morbid obesity ([place]) 06/12/2018  . Anemia 09/14/2017  . CKD (chronic kidney disease) stage 3, GFR 30-59 ml/min (HCC) 09/14/2017  . Atrial fibrillation ([place]) 09/14/2017  . Hypoalbuminemia 11/23/2016  . Hypoxia 10/29/2016  . Urinary frequency 09/17/2015  . At high risk for falls 03/30/2015  . Generalized osteoarthritis 07/16/2014  . Urinary  incontinence 07/16/2014  . Pulmonary interstitial fibrosis ([place]) 07/03/2014  . Abnormal CXR (chest x-ray) 05/27/2014  . Thyroid mass of unclear etiology 05/07/2014  . Vitamin D deficiency 01/31/2014  . Allergic rhinitis 01/31/2014  . Diverticulosis of sigmoid colon 12/13/2012  . Right ventricular dysfunction 07/20/2011  . Heart disease, unspecified 07/20/2011  . Alzheimer's dementia without behavioral disturbance 09/12/2010  . Goiter 06/19/2009  . Essential hypertension 12/11/2008  . Diabetes mellitus, insulin dependent (IDDM), controlled ([place]) 09/05/2008  . Hyperlipidemia with target LDL less than 100 07/19/2008  . BENIGN PROSTATIC HYPERTROPHY, HX OF 03/28/2008    Past Surgical History:  Procedure Laterality Date  . CARPAL TUNNEL RELEASE    . COLONOSCOPY  08/31/2012   Procedure: COLONOSCOPY;  Surgeon: Rogene Houston, MD;  Location: [facility] ENDO SUITE;  Service: Endoscopy;  Laterality: N/A;  830  . CYSTOSCOPY WITH INJECTION N/A 05/15/2018   Procedure: CYSTOSCOPY WITH BOTOX INJECTION;  Surgeon: Cleon Gustin, MD;  Location: [facility] ORS;  Service: Urology;  Laterality: N/A;  . CYSTOSCOPY WITH INSERTION OF UROLIFT N/A 09/02/2016   Procedure: CYSTOSCOPY WITH INSERTION OF UROLIFT;  Surgeon: Cleon Gustin, MD;  Location: [facility];  Service: Urology;  Laterality: N/A;  . EYE SURGERY Bilateral    bilateral cataract  . Right total knee replacement  04/23/2011  . SPINE SURGERY  prior to 2012   Neck fusion  .  UMBILICAL HERNIA REPAIR     umbilical        Home Medications    Prior to Admission medications   Medication Sig Start Date End Date Taking? Authorizing Provider  acetaminophen (TYLENOL) 500 MG tablet Take 1,000 mg by mouth every 6 (six) hours as needed for mild pain, moderate pain or headache.    Yes [provider]  Carboxymethylcellul-Glycerin 0.5-0.9 % SOLN Place 1 drop into both eyes 4 (four) times daily as needed (for dry eyes).    Yes [provider]  clobetasol cream (TEMOVATE) 8.54 % APPLY 1 APPLICATION TOPICALLY 2 (TWO) TIMES DAILY. 05/29/18  Yes [name], MD  donepezil (ARICEPT) 10 MG tablet TAKE 1 TABLET AT BEDTIME 03/27/18  Yes [name], MD  FEROSUL 325 (65 Fe) MG tablet TAKE 1 TABLET TWICE DAILY Patient taking differently: TAKE 1 TABLET (325 MG) BY MOUTH ONCE DAILY 03/27/18  Yes [name], MD  furosemide (LASIX) 20 MG tablet Take 1 tablet (20 mg total) by mouth daily. 07/04/18 10/02/18 Yes [name], MD  glipiZIDE (GLUCOTROL XL) 10 MG 24 hr tablet Take 1 tablet (10 mg total) by mouth daily with breakfast. Patient taking differently: Take 20 mg by mouth daily with breakfast.  06/06/18  Yes [name], MD  glucose blood (TRUE METRIX BLOOD GLUCOSE TEST) test strip TEST ONCE DAILY dx e11.9 06/07/18  Yes [name], MD  Insulin Glargine (LANTUS SOLOSTAR) 100 UNIT/ML Solostar Pen Inject 20 Units into the skin daily as needed (if blood sugar is over 100). If blood sugar is over 100 06/06/18  Yes [name], MD  Insulin Pen Needle (PEN NEEDLES) 31G X 5 MM MISC 1 each by Does not apply route as directed. Use to inject lantus daily dx E11.65 02/14/18  Yes [name], MD  loratadine (CLARITIN) 10 MG tablet Take 10 mg by mouth every morning.    Yes [provider]  lovastatin (MEVACOR) 40 MG tablet TAKE 1 TABLET AT BEDTIME 03/27/18  Yes [name], MD  omeprazole (PRILOSEC) 40 MG capsule TAKE 1 CAPSULE EVERY DAY 03/27/18  Yes [name], MD  sertraline (ZOLOFT) 50 MG tablet TAKE 1 TABLET EVERY DAY 03/27/18  Yes [name], MD  tamsulosin (FLOMAX) 0.4 MG CAPS capsule TAKE 1 CAPSULE EVERY DAY 03/27/18  Yes [name], MD  traMADol Veatrice Bourbon) 50 MG tablet Take one tablet two times daily as needed for pain 06/16/18  Yes [name], MD  Trospium Chloride 60 MG CP24 Take 60 mg by mouth daily.  06/02/17  Yes [provider]    Vitamin D, Ergocalciferol, (DRISDOL) 50000 units CAPS capsule TAKE 1 CAPSULE EVERY 7 (SEVEN) DAYS. Patient taking differently: TAKE 1 CAPSULE (50,000 UNITS) EVERY FRIDAY MORNING. 01/30/18  Yes [name], MD    Family History Family History  Problem Relation Age of Onset  . Cancer Mother        Stomach  . Diabetes Sister   . Hypertension Sister   . Diabetes Brother   . Cancer Brother        Gallbladder  . Seizures Son        due to meningitis as a child    Social History Social History   Tobacco Use  . Smoking status: Never Smoker  . Smokeless tobacco: Never Used  Substance Use Topics  . Alcohol use: No  . Drug use: No     Allergies  Zosyn [piperacillin sod-tazobactam so]; Ace inhibitors; and Vancomycin   Review of Systems Review of Systems  Constitutional: Negative for appetite change and fatigue.  HENT: Negative for congestion, ear discharge and sinus pressure.   Eyes: Negative for discharge.  Respiratory: Negative for cough and shortness of breath.   Cardiovascular: Negative for chest pain.  Gastrointestinal: Negative for abdominal pain and diarrhea.  Genitourinary: Negative for frequency and hematuria.  Musculoskeletal: Negative for back pain.  Skin: Negative for rash.  Neurological: Positive for weakness. Negative for focal weakness, seizures and headaches.  Psychiatric/Behavioral: Negative for hallucinations.     Physical Exam Updated Vital Signs BP (!) 151/88   Pulse 76   Temp 97.6 F (36.4 C) (Oral)   Resp (!) 24   Ht 5\' 10"  (1.778 m)   Wt 115.2 kg   SpO2 95%   BMI 36.45 kg/m   Physical Exam  Constitutional: He is oriented to person, place, and time. He appears well-developed.  HENT:  Head: Normocephalic.  His membranes dry  Eyes: Conjunctivae and EOM are normal. No scleral icterus.  Neck: Neck supple. No thyromegaly present.  Cardiovascular: Normal rate and regular rhythm. Exam reveals no gallop and no friction rub.  No murmur  heard. Pulmonary/Chest: No stridor. He has no wheezes. He has no rales. He exhibits no tenderness.  Abdominal: He exhibits no distension. There is no tenderness. There is no rebound.  Musculoskeletal: Normal range of motion. He exhibits no edema.  Mild tenderness right knee  Lymphadenopathy:    He has no cervical adenopathy.  Neurological: He is oriented to person, place, and time. He exhibits normal muscle tone. Coordination normal.  Skin: No rash noted. No erythema.  Psychiatric: He has a normal mood and affect. His behavior is normal.     ED Treatments / Results  Labs (all labs ordered are listed, but only abnormal results are displayed) Labs Reviewed  CBC WITH DIFFERENTIAL/PLATELET - Abnormal; Notable for the following components:      Result Value   RBC 4.06 (*)    Hemoglobin 12.5 (*)    HCT 38.1 (*)    All other components within normal limits  BASIC METABOLIC PANEL - Abnormal; Notable for the following components:   Glucose, Bld 173 (*)    Creatinine, Ser 2.09 (*)    GFR calc non Af Amer 27 (*)    GFR calc Af Amer 32 (*)    All other components within normal limits  URINALYSIS, ROUTINE W REFLEX MICROSCOPIC - Abnormal; Notable for the following components:   Hgb urine dipstick MODERATE (*)    Protein, ur 30 (*)    All other components within normal limits  HEPATIC FUNCTION PANEL - Abnormal; Notable for the following components:   Total Bilirubin 1.4 (*)    Indirect Bilirubin 1.2 (*)    All other components within normal limits  CK - Abnormal; Notable for the following components:   Total CK 978 (*)    All other components within normal limits    EKG EKG Interpretation  Date/Time:  Thursday August 03 2018 09:40:39 EDT Ventricular Rate:  74 PR Interval:    QRS Duration: 142 QT Interval:  410 QTC Calculation: 455 R Axis:   71 Text Interpretation:  Sinus rhythm Right bundle branch block Confirmed by Milton Ferguson [phone]) on 08/03/2018 2:05:16  PM   Radiology Dg Chest 2 View  Result Date: 08/03/2018 CLINICAL DATA:  Acute pain following fall last night. EXAM: CHEST - 2 VIEW COMPARISON:  08/01/2018 and prior exams FINDINGS: Cardiomegaly again noted. Mild bibasilar atelectasis/scarring again identified. There is no evidence of airspace disease, pleural effusion or pneumothorax. No acute bony abnormalities are identified. IMPRESSION: Cardiomegaly without evidence of acute cardiopulmonary disease. Mild bibasilar atelectasis/scarring. Electronically Signed   By: Margarette Canada M.D.   On: 08/03/2018 10:50   Dg Chest 2 View  Result Date: 08/02/2018 CLINICAL DATA:  Cough and congestion EXAM: CHEST - 2 VIEW COMPARISON:  December 12, 2017 and September 14, 2017 FINDINGS: There is scarring and fibrosis in the mid and lower lung zones, most severe in the bases, stable. There is no frank edema or consolidation. Heart is borderline enlarged, stable, with pulmonary vascularity normal. No adenopathy. There is degenerative change in the thoracic spine. IMPRESSION: Fibrosis and scarring in the mid and lower lung zones, most severe in the bases, stable. No edema or consolidation. Stable cardiac prominence. No adenopathy evident. Electronically Signed   By: Lowella Grip III M.D.   On: 08/02/2018 08:42   Dg Pelvis 1-2 Views  Result Date: 08/03/2018 CLINICAL DATA:  Acute pelvic pain following fall last night. Initial encounter. EXAM: PELVIS - 1-2 VIEW COMPARISON:  09/18/2017 CT FINDINGS: There is no evidence of pelvic fracture or diastasis. No pelvic bone lesions are seen. IMPRESSION: Negative. Electronically Signed   By: Margarette Canada M.D.   On: 08/03/2018 10:54   Ct Head Wo Contrast  Result Date: 08/03/2018 CLINICAL DATA:  Altered level of consciousness.  Fall today. EXAM: CT HEAD WITHOUT CONTRAST CT CERVICAL SPINE WITHOUT CONTRAST TECHNIQUE: Multidetector CT imaging of the head and cervical spine was performed following the standard protocol without intravenous  contrast. Multiplanar CT image reconstructions of the cervical spine were also generated. COMPARISON:  CT head 02/21/2017 FINDINGS: CT HEAD FINDINGS Brain: Moderate atrophy. Moderate chronic microvascular ischemic change. Negative for acute infarct. Negative for acute hemorrhage or mass. No midline shift. Extensive dural ossification anteriorly the appears unchanged. Vascular: Negative for hyperdense vessel Skull: Negative Sinuses/Orbits: Negative Other: None CT CERVICAL SPINE FINDINGS Alignment: Straightening of the cervical lordosis with mild kyphosis at C3-4. Normal alignment. Skull base and vertebrae: Negative for fracture Soft tissues and spinal canal: 3 cm mass left lobe of the thyroid, incompletely evaluated on this study. Recommend thyroid ultrasound. Atherosclerotic calcification carotid artery. No enlarged lymph nodes in the neck. Disc levels: ACDF with anterior plate and screws C4 through C6. Disc degeneration and spurring at C2-3, C3-4, and C6-7. Disc and facet degeneration at C7-T1 and T1-2. Upper chest: Negative Other: None IMPRESSION: 1. Atrophy and chronic microvascular ischemia. No acute intracranial abnormality 2. ACDF C4 through C6. Negative for fracture. Cervical spondylosis as above 3. 3 cm mass left lobe of thyroid incompletely evaluated on this study. Recommend thyroid ultrasound. Electronically Signed   By: Franchot Gallo M.D.   On: 08/03/2018 11:20   Ct Cervical Spine Wo Contrast  Result Date: 08/03/2018 CLINICAL DATA:  Altered level of consciousness.  Fall today. EXAM: CT HEAD WITHOUT CONTRAST CT CERVICAL SPINE WITHOUT CONTRAST TECHNIQUE: Multidetector CT imaging of the head and cervical spine was performed following the standard protocol without intravenous contrast. Multiplanar CT image reconstructions of the cervical spine were also generated. COMPARISON:  CT head 02/21/2017 FINDINGS: CT HEAD FINDINGS Brain: Moderate atrophy. Moderate chronic microvascular ischemic change. Negative  for acute infarct. Negative for acute hemorrhage or mass. No midline shift. Extensive dural ossification anteriorly the appears unchanged. Vascular: Negative for hyperdense vessel Skull: Negative Sinuses/Orbits: Negative Other: None CT CERVICAL SPINE FINDINGS  Alignment: Straightening of the cervical lordosis with mild kyphosis at C3-4. Normal alignment. Skull base and vertebrae: Negative for fracture Soft tissues and spinal canal: 3 cm mass left lobe of the thyroid, incompletely evaluated on this study. Recommend thyroid ultrasound. Atherosclerotic calcification carotid artery. No enlarged lymph nodes in the neck. Disc levels: ACDF with anterior plate and screws C4 through C6. Disc degeneration and spurring at C2-3, C3-4, and C6-7. Disc and facet degeneration at C7-T1 and T1-2. Upper chest: Negative Other: None IMPRESSION: 1. Atrophy and chronic microvascular ischemia. No acute intracranial abnormality 2. ACDF C4 through C6. Negative for fracture. Cervical spondylosis as above 3. 3 cm mass left lobe of thyroid incompletely evaluated on this study. Recommend thyroid ultrasound. Electronically Signed   By: Franchot Gallo M.D.   On: 08/03/2018 11:20   Dg Knee Complete 4 Views Right  Result Date: 08/03/2018 CLINICAL DATA:  [place]. Rt knee pain EXAM: RIGHT KNEE - COMPLETE 4+ VIEW COMPARISON:  07/27/2010 FINDINGS: Patient has undergone interval knee arthroplasty. The hardware is well-seated. No evidence for acute fracture or subluxation. Trace joint effusion is present. There is popliteal artery calcification. IMPRESSION: 1. Postoperative changes. 2. Trace joint effusion. 3.  No evidence for acute  abnormality. Electronically Signed   By: Nolon Nations M.D.   On: 08/03/2018 13:36    Procedures Procedures (including critical care time)  Medications Ordered in ED Medications  sodium chloride 0.9 % bolus 1,000 mL (0 mLs Intravenous Stopped 08/03/18 1302)     Initial Impression / Assessment and Plan / ED Course   I have reviewed the triage vital signs and the nursing notes.  Pertinent labs & imaging results that were available during my care of the patient were reviewed by me and considered in my medical decision making (see chart for details).     Labs unremarkable except for elevated CPK.  Patient has mild rhabdo.  He will be admitted for IV fluids  Final Clinical Impressions(s) / ED Diagnoses   Final diagnoses:  Non-traumatic rhabdomyolysis    ED Discharge Orders    None       Milton Ferguson, MD 08/03/18 1423

## 2018-08-03 NOTE — H&P (Signed)
History and Physical    Samuel Ryan:[id number] DOB: 10-20-1931 DOA: 08/03/2018  Referring MD/NP/PA: Milton Ferguson, EDP PCP: [name], MD  Patient coming from: Home  Chief Complaint: Fall at home  HPI: Samuel Ryan is a 84 y.o. male with history of mild dementia who was fairly independent at home with a walker, does have a caregiver, BPH, insulin-dependent diabetes, hyperlipidemia among other things.  Son works nights and when he returned from work at 35 he found his father on the floor.  He was little more confused than normal.  He called 911 and was brought to the hospital for evaluation.  Patient is currently alert awake and oriented.  He tells me that he was watching the Samuel Ryan show went to stand up and fell, he states he was probably on the floor for around 2 to 3 hours and was unable to stand up on his own.  He denies loss of consciousness, chest pain, shortness of breath or any other symptoms.  Upon arrival in the emergency department vital signs are noted to be stable, may be slightly hypertensive, labs are significant for a hemoglobin of 12.5, a creatinine of 2.09 with a baseline between 1.7 and 1.9.  CPK level is elevated at 978.  Admission is requested for further evaluation and management.  Past Medical/Surgical History: Past Medical History:  Diagnosis Date  . Acute cholecystitis 11/2012   Drained percutaneously  . Anxiety   . BPH (benign prostatic hypertrophy)   . Deep venous thrombosis (HCC)    Left leg, diagnosed 7/12  . Dementia    Needs supervision for safety per son - POA  . Diverticulitis   . Essential hypertension, benign   . GERD (gastroesophageal reflux disease)   . History of pneumonia    Prior to 2012  . Incontinence of urine   . Mixed hyperlipidemia   . Osteoarthritis   . Pulmonary embolism ([place])    Diagnosed 7/12  . Type 2 diabetes mellitus ([place])     Past Surgical History:  Procedure Laterality Date  . CARPAL TUNNEL RELEASE    .  COLONOSCOPY  08/31/2012   Procedure: COLONOSCOPY;  Surgeon: Rogene Houston, MD;  Location: [facility];  Service: Endoscopy;  Laterality: N/A;  830  . CYSTOSCOPY WITH INJECTION N/A 05/15/2018   Procedure: CYSTOSCOPY WITH BOTOX INJECTION;  Surgeon: Cleon Gustin, MD;  Location: [facility];  Service: Urology;  Laterality: N/A;  . CYSTOSCOPY WITH INSERTION OF UROLIFT N/A 09/02/2016   Procedure: CYSTOSCOPY WITH INSERTION OF UROLIFT;  Surgeon: Cleon Gustin, MD;  [facility];  Service: Urology;  Laterality: N/A;  . EYE SURGERY Bilateral    bilateral cataract  . Right total knee replacement  04/23/2011  . SPINE SURGERY  prior to 2012   Neck fusion  . UMBILICAL HERNIA REPAIR     umbilical    Social History:  reports that he has never smoked. He has never used smokeless tobacco. He reports that he does not drink alcohol or use drugs.  Allergies: Allergies  Allergen Reactions  . Zosyn [Piperacillin Sod-Tazobactam So] Anaphylaxis, Swelling and Other (See Comments)    Facial swelling Has patient had a PCN reaction causing immediate rash, facial/tongue/throat swelling, SOB or lightheadedness with hypotension: Yes Has patient had a PCN reaction causing severe rash involving mucus membranes or skin necrosis: No Has patient had a PCN reaction that required hospitalization: No Has patient had a PCN reaction occurring within the  last 10 years: Yes If all of the above answers are "NO", then may proceed with Cephalosporin use.   . Ace Inhibitors Cough  . Vancomycin Swelling and Other (See Comments)    Facial swelling    Family History:  Family History  Problem Relation Age of Onset  . Cancer Mother        Stomach  . Diabetes Sister   . Hypertension Sister   . Diabetes Brother   . Cancer Brother        Gallbladder  . Seizures Son        due to meningitis as a child    Prior to Admission medications   Medication Sig Start Date End Date Taking? Authorizing  Provider  acetaminophen (TYLENOL) 500 MG tablet Take 1,000 mg by mouth every 6 (six) hours as needed for mild pain, moderate pain or headache.    Yes [provider]  Carboxymethylcellul-Glycerin 0.5-0.9 % SOLN Place 1 drop into both eyes 4 (four) times daily as needed (for dry eyes).    Yes [provider]  clobetasol cream (TEMOVATE) 2.84 % APPLY 1 APPLICATION TOPICALLY 2 (TWO) TIMES DAILY. 05/29/18  Yes [name], MD  donepezil (ARICEPT) 10 MG tablet TAKE 1 TABLET AT BEDTIME 03/27/18  Yes [name], MD  FEROSUL 325 (65 Fe) MG tablet TAKE 1 TABLET TWICE DAILY Patient taking differently: TAKE 1 TABLET (325 MG) BY MOUTH ONCE DAILY 03/27/18  Yes [name], MD  furosemide (LASIX) 20 MG tablet Take 1 tablet (20 mg total) by mouth daily. 07/04/18 10/02/18 Yes [name], MD  glipiZIDE (GLUCOTROL XL) 10 MG 24 hr tablet Take 1 tablet (10 mg total) by mouth daily with breakfast. Patient taking differently: Take 20 mg by mouth daily with breakfast.  06/06/18  Yes [name], MD  glucose blood (TRUE METRIX BLOOD GLUCOSE TEST) test strip TEST ONCE DAILY dx e11.9 06/07/18  Yes [name], MD  Insulin Glargine (LANTUS SOLOSTAR) 100 UNIT/ML Solostar Pen Inject 20 Units into the skin daily as needed (if blood sugar is over 100). If blood sugar is over 100 06/06/18  Yes [name], MD  Insulin Pen Needle (PEN NEEDLES) 31G X 5 MM MISC 1 each by Does not apply route as directed. Use to inject lantus daily dx E11.65 02/14/18  Yes [name], MD  loratadine (CLARITIN) 10 MG tablet Take 10 mg by mouth every morning.    Yes [provider]  lovastatin (MEVACOR) 40 MG tablet TAKE 1 TABLET AT BEDTIME 03/27/18  Yes [name], MD  omeprazole (PRILOSEC) 40 MG capsule TAKE 1 CAPSULE EVERY DAY 03/27/18  Yes [name], MD  sertraline (ZOLOFT) 50 MG tablet TAKE 1 TABLET EVERY DAY 03/27/18  Yes [name], MD    tamsulosin (FLOMAX) 0.4 MG CAPS capsule TAKE 1 CAPSULE EVERY DAY 03/27/18  Yes [name], MD  traMADol Veatrice [place]bon) 50 MG tablet Take one tablet two times daily as needed for pain 06/16/18  Yes [name], MD  Trospium Chloride 60 MG CP24 Take 60 mg by mouth daily.  06/02/17  Yes [provider]  Vitamin D, Ergocalciferol, (DRISDOL) 50000 units CAPS capsule TAKE 1 CAPSULE EVERY 7 (SEVEN) DAYS. Patient taking differently: TAKE 1 CAPSULE (50,000 UNITS) EVERY FRIDAY MORNING. 01/30/18  Yes [name], MD    Review of Systems:  Constitutional: Denies fever, chills, diaphoresis, appetite change and fatigue.  HEENT: Denies photophobia, eye  pain, redness, hearing loss, ear pain, congestion, sore throat, rhinorrhea, sneezing, mouth sores, trouble swallowing, neck pain, neck stiffness and tinnitus.   Respiratory: Denies SOB, DOE, cough, chest tightness,  and wheezing.   Cardiovascular: Denies chest pain, palpitations and leg swelling.  Gastrointestinal: Denies nausea, vomiting, abdominal pain, diarrhea, constipation, blood in stool and abdominal distention.  Genitourinary: Denies dysuria, urgency, frequency, hematuria, flank pain and difficulty urinating.  Endocrine: Denies: hot or cold intolerance, sweats, changes in hair or nails, polyuria, polydipsia. Musculoskeletal: Denies myalgias, back pain, joint swelling, arthralgias and gait problem.  Skin: Denies pallor, rash and wound.  Neurological: Denies dizziness, seizures, syncope,  light-headedness, numbness and headaches. Has been complaining of weakness. Hematological: Denies adenopathy. Easy bruising, personal or family bleeding history  Psychiatric/Behavioral: Denies suicidal ideation, mood changes, confusion, nervousness, sleep disturbance and agitation    Physical Exam: Vitals:   08/03/18 1430 08/03/18 1500 08/03/18 1508 08/03/18 1530  BP: (!) 142/115 (!) 159/95  (!) 148/109  Pulse: 79 86  78  Resp: (!) 24 (!) 31   (!) 25  Temp:   98.5 F (36.9 C)   TempSrc:      SpO2: 92% (!) 88%  100%  Weight:      Height:         Constitutional: NAD, calm, comfortable Eyes: PERRL, lids and conjunctivae normal ENMT: Mucous membranes are dry. Posterior pharynx clear of any exudate or lesions.Normal dentition.  Neck: normal, supple, no masses, no thyromegaly Respiratory: clear to auscultation bilaterally, no wheezing, no crackles. Normal respiratory effort. No accessory muscle use.  Cardiovascular: Regular rate and rhythm, no murmurs / rubs / gallops. No extremity edema. 2+ pedal pulses. No carotid bruits.  Abdomen: no tenderness, no masses palpated. No hepatosplenomegaly. Bowel sounds positive.  Musculoskeletal: no clubbing / cyanosis. No joint deformity upper and lower extremities. Good ROM, no contractures. Normal muscle tone.  Skin: no rashes, lesions, ulcers. No induration Neurologic: CN 2-12 grossly intact. Sensation intact, DTR normal. Strength 5/5 in all 4.  Psychiatric: Normal judgment and insight. Alert and oriented x 3. Normal mood.    Labs on Admission: I have personally reviewed the following labs and imaging studies  CBC: Recent Labs  Lab 08/01/18 1419 08/03/18 0934  WBC 5.0 7.7  NEUTROABS  --  6.0  HGB 11.9* 12.5*  HCT 36.4* 38.1*  MCV 92.6 93.8  PLT 175 093   Basic Metabolic Panel: Recent Labs  Lab 08/01/18 1419 08/03/18 0934  NA 137 139  K 4.7 4.4  CL 104 105  CO2 27 26  GLUCOSE 91 173*  BUN 20 23  CREATININE 1.98* 2.09*  CALCIUM 8.6 9.1   GFR: Estimated Creatinine Clearance: 32.9 mL/min (A) (by C-G formula based on SCr of 2.09 mg/dL (H)). Liver Function Tests: Recent Labs  Lab 08/01/18 1419 08/03/18 0934  AST 13 34  ALT 9 18  ALKPHOS  --  63  BILITOT 0.7 1.4*  PROT 6.9 8.0  ALBUMIN  --  3.7   No results for input(s): LIPASE, AMYLASE in the last 168 hours. No results for input(s): AMMONIA in the last 168 hours. Coagulation Profile: No results for input(s):  INR, PROTIME in the last 168 hours. Cardiac Enzymes: Recent Labs  Lab 08/03/18 0934  CKTOTAL 978*   BNP (last 3 results) No results for input(s): PROBNP in the last 8760 hours. HbA1C: No results for input(s): HGBA1C in the last 72 hours. CBG: No results for input(s): GLUCAP in the last 168 hours. Lipid Profile:  No results for input(s): CHOL, HDL, LDLCALC, TRIG, CHOLHDL, LDLDIRECT in the last 72 hours. Thyroid Function Tests: No results for input(s): TSH, T4TOTAL, FREET4, T3FREE, THYROIDAB in the last 72 hours. Anemia Panel: No results for input(s): VITAMINB12, FOLATE, FERRITIN, TIBC, IRON, RETICCTPCT in the last 72 hours. Urine analysis:    Component Value Date/Time   COLORURINE YELLOW 08/03/2018 [place] 08/03/2018 0934   LABSPEC 1.019 08/03/2018 0934   PHURINE 5.0 08/03/2018 0934   GLUCOSEU NEGATIVE 08/03/2018 0934   HGBUR MODERATE (A) 08/03/2018 0934   BILIRUBINUR NEGATIVE 08/03/2018 0934   BILIRUBINUR neg 12/12/2017 1516   KETONESUR NEGATIVE 08/03/2018 0934   PROTEINUR 30 (A) 08/03/2018 0934   UROBILINOGEN 1.0 12/12/2017 1516   UROBILINOGEN 0.2 06/02/2015 1310   NITRITE NEGATIVE 08/03/2018 0934   LEUKOCYTESUR NEGATIVE 08/03/2018 0934   Sepsis Labs: @LABRCNTIP (procalcitonin:4,lacticidven:4) ) Recent Results (from the past 240 hour(s))  Urine Culture     Status: None   Collection Time: 08/02/18  9:37 AM  Result Value Ref Range Status   MICRO NUMBER: [id number]  Final   SPECIMEN QUALITY: ADEQUATE  Final   Sample Source URINE  Final   STATUS: FINAL  Final   Result:   Final    Multiple organisms present, each less than 10,000 CFU/mL. These organisms, commonly found on external and internal genitalia, are considered to be colonizers. No further testing performed.     Radiological Exams on Admission: Dg Chest 2 View  Result Date: 08/03/2018 CLINICAL DATA:  Acute pain following fall last night. EXAM: CHEST - 2 VIEW COMPARISON:  08/01/2018 and prior  exams FINDINGS: Cardiomegaly again noted. Mild bibasilar atelectasis/scarring again identified. There is no evidence of airspace disease, pleural effusion or pneumothorax. No acute bony abnormalities are identified. IMPRESSION: Cardiomegaly without evidence of acute cardiopulmonary disease. Mild bibasilar atelectasis/scarring. Electronically Signed   By: Margarette Canada M.D.   On: 08/03/2018 10:50   Dg Pelvis 1-2 Views  Result Date: 08/03/2018 CLINICAL DATA:  Acute pelvic pain following fall last night. Initial encounter. EXAM: PELVIS - 1-2 VIEW COMPARISON:  09/18/2017 CT FINDINGS: There is no evidence of pelvic fracture or diastasis. No pelvic bone lesions are seen. IMPRESSION: Negative. Electronically Signed   By: Margarette Canada M.D.   On: 08/03/2018 10:54   Ct Head Wo Contrast  Result Date: 08/03/2018 CLINICAL DATA:  Altered level of consciousness.  Fall today. EXAM: CT HEAD WITHOUT CONTRAST CT CERVICAL SPINE WITHOUT CONTRAST TECHNIQUE: Multidetector CT imaging of the head and cervical spine was performed following the standard protocol without intravenous contrast. Multiplanar CT image reconstructions of the cervical spine were also generated. COMPARISON:  CT head 02/21/2017 FINDINGS: CT HEAD FINDINGS Brain: Moderate atrophy. Moderate chronic microvascular ischemic change. Negative for acute infarct. Negative for acute hemorrhage or mass. No midline shift. Extensive dural ossification anteriorly the appears unchanged. Vascular: Negative for hyperdense vessel Skull: Negative Sinuses/Orbits: Negative Other: None CT CERVICAL SPINE FINDINGS Alignment: Straightening of the cervical lordosis with mild kyphosis at C3-4. Normal alignment. Skull base and vertebrae: Negative for fracture Soft tissues and spinal canal: 3 cm mass left lobe of the thyroid, incompletely evaluated on this study. Recommend thyroid ultrasound. Atherosclerotic calcification carotid artery. No enlarged lymph nodes in the neck. Disc levels: ACDF  with anterior plate and screws C4 through C6. Disc degeneration and spurring at C2-3, C3-4, and C6-7. Disc and facet degeneration at C7-T1 and T1-2. Upper chest: Negative Other: None IMPRESSION: 1. Atrophy and chronic microvascular ischemia. No acute intracranial  abnormality 2. ACDF C4 through C6. Negative for fracture. Cervical spondylosis as above 3. 3 cm mass left lobe of thyroid incompletely evaluated on this study. Recommend thyroid ultrasound. Electronically Signed   By: Franchot Gallo M.D.   On: 08/03/2018 11:20   Ct Cervical Spine Wo Contrast  Result Date: 08/03/2018 CLINICAL DATA:  Altered level of consciousness.  Fall today. EXAM: CT HEAD WITHOUT CONTRAST CT CERVICAL SPINE WITHOUT CONTRAST TECHNIQUE: Multidetector CT imaging of the head and cervical spine was performed following the standard protocol without intravenous contrast. Multiplanar CT image reconstructions of the cervical spine were also generated. COMPARISON:  CT head 02/21/2017 FINDINGS: CT HEAD FINDINGS Brain: Moderate atrophy. Moderate chronic microvascular ischemic change. Negative for acute infarct. Negative for acute hemorrhage or mass. No midline shift. Extensive dural ossification anteriorly the appears unchanged. Vascular: Negative for hyperdense vessel Skull: Negative Sinuses/Orbits: Negative Other: None CT CERVICAL SPINE FINDINGS Alignment: Straightening of the cervical lordosis with mild kyphosis at C3-4. Normal alignment. Skull base and vertebrae: Negative for fracture Soft tissues and spinal canal: 3 cm mass left lobe of the thyroid, incompletely evaluated on this study. Recommend thyroid ultrasound. Atherosclerotic calcification carotid artery. No enlarged lymph nodes in the neck. Disc levels: ACDF with anterior plate and screws C4 through C6. Disc degeneration and spurring at C2-3, C3-4, and C6-7. Disc and facet degeneration at C7-T1 and T1-2. Upper chest: Negative Other: None IMPRESSION: 1. Atrophy and chronic microvascular  ischemia. No acute intracranial abnormality 2. ACDF C4 through C6. Negative for fracture. Cervical spondylosis as above 3. 3 cm mass left lobe of thyroid incompletely evaluated on this study. Recommend thyroid ultrasound. Electronically Signed   By: Franchot Gallo M.D.   On: 08/03/2018 11:20   Dg Knee Complete 4 Views Right  Result Date: 08/03/2018 CLINICAL DATA:  [place]. Rt knee pain EXAM: RIGHT KNEE - COMPLETE 4+ VIEW COMPARISON:  07/27/2010 FINDINGS: Patient has undergone interval knee arthroplasty. The hardware is well-seated. No evidence for acute fracture or subluxation. Trace joint effusion is present. There is popliteal artery calcification. IMPRESSION: 1. Postoperative changes. 2. Trace joint effusion. 3.  No evidence for acute  abnormality. Electronically Signed   By: Nolon Nations M.D.   On: 08/03/2018 13:36    EKG: Independently reviewed.  Sinus rhythm at a rate of 74, right bundle branch block is present, no acute ischemic changes  Assessment/Plan Principal Problem:   Rhabdomyolysis Active Problems:   Diabetes mellitus, insulin dependent (IDDM), controlled (HCC)   Hyperlipidemia with target LDL less than 100   Alzheimer's dementia without behavioral disturbance   Essential hypertension   BPH (benign prostatic hyperplasia)   Vitamin D deficiency   Urinary incontinence   CKD (chronic kidney disease) stage 3, GFR 30-59 ml/min (HCC)    Fall at home -X-rays do not demonstrate any fractures, CT head and CT C-spine are also without acute changes. -Request PT evaluation as patient's son and caregiver state that he has been weaker than usual. -Check vitamin B12, check TSH, check vitamin D.  Rhabdomyolysis -Likely due to fall with prolonged immobilization -We will give IV fluids overnight and recheck CPK level in a.m.  Acute on chronic kidney disease stage III-IV -Baseline creatinine appears to be between 1.7 and 1.9. -Creatinine on admission is 2.09. -Hopefully acute component  will improve with IV fluids, will check postvoid residuals as well given his history of overactive bladder and BPH. -Would benefit from outpatient nephrology referral on discharge.  Alzheimer's dementia -Per son has been a little  more confused than usual but very close to his baseline. -Continue outpatient follow-up.   DVT prophylaxis: Lovenox Code Status: Full code Family Communication: Son at bedside updated on plan of care and all questions answered Disposition Plan: Home when medically stable, anticipate 24 hours Consults called: None Admission status: It is my clinical opinion that referral for OBSERVATION is reasonable and necessary in this patient based on the above information provided. The aforementioned taken together are felt to place the patient at high risk for further clinical deterioration. However it is anticipated that the patient may be medically stable for discharge from the hospital within 24 to 48 hours.      Time Spent: 85 minutes  Estela Isaac Bliss MD [facility] Pager [phone]  If 7PM-7AM, please contact night-coverage [email or web address] Password [email or web address]  08/03/2018, 4:21 PM

## 2018-08-03 NOTE — ED Notes (Signed)
Have given pt a meal tray  

## 2018-08-03 NOTE — Telephone Encounter (Signed)
Pt currently as the [facility] ER

## 2018-08-03 NOTE — Telephone Encounter (Signed)
Son Samuel Ryan is calling regarding PT being dehydrated, and needing fluids, I attempted to call no answer no vm.. [phone]

## 2018-08-03 NOTE — ED Notes (Signed)
EKG given to Dr. Zammit 

## 2018-08-03 NOTE — ED Notes (Signed)
Have applied a condom cath to pt to try to catch urine sample

## 2018-08-03 NOTE — Progress Notes (Signed)
Patient arrived to the floor and before we could check his CBG he had eaten, so I decided not to check his CBG because it would not have been an accurate reading. He is asymptomatic. I discussed this with the night nurse coming in.

## 2018-08-03 NOTE — ED Triage Notes (Signed)
Pt's son reports they have noticed pt having some confusion for the past week.  Saw his pcp 2 days ago and had blood work and chest x ray.  Son says was told everything looked ok except his renal function was abnormal.  Son says this morning he found pt on the floor.  Says he fell and was in floor for about 2 hours.  Reports pt felt clammy.  Caregiver reports his blood sugar was 156.  Pt presently alert and oriented.

## 2018-08-03 NOTE — ED Notes (Signed)
Pt to CT and xray  

## 2018-08-04 DIAGNOSIS — N183 Chronic kidney disease, stage 3 (moderate): Secondary | ICD-10-CM | POA: Diagnosis not present

## 2018-08-04 DIAGNOSIS — I10 Essential (primary) hypertension: Secondary | ICD-10-CM | POA: Diagnosis not present

## 2018-08-04 DIAGNOSIS — G309 Alzheimer's disease, unspecified: Secondary | ICD-10-CM | POA: Diagnosis not present

## 2018-08-04 DIAGNOSIS — M6282 Rhabdomyolysis: Secondary | ICD-10-CM | POA: Diagnosis not present

## 2018-08-04 DIAGNOSIS — N401 Enlarged prostate with lower urinary tract symptoms: Secondary | ICD-10-CM | POA: Diagnosis not present

## 2018-08-04 DIAGNOSIS — G308 Other Alzheimer's disease: Secondary | ICD-10-CM

## 2018-08-04 DIAGNOSIS — F0280 Dementia in other diseases classified elsewhere without behavioral disturbance: Secondary | ICD-10-CM | POA: Diagnosis not present

## 2018-08-04 DIAGNOSIS — R338 Other retention of urine: Secondary | ICD-10-CM | POA: Diagnosis not present

## 2018-08-04 LAB — CBC
HCT: 35.2 % — ABNORMAL LOW (ref 39.0–52.0)
HEMOGLOBIN: 11.5 g/dL — AB (ref 13.0–17.0)
MCH: 30.6 pg (ref 26.0–34.0)
MCHC: 32.7 g/dL (ref 30.0–36.0)
MCV: 93.6 fL (ref 78.0–100.0)
Platelets: 161 10*3/uL (ref 150–400)
RBC: 3.76 MIL/uL — AB (ref 4.22–5.81)
RDW: 14.6 % (ref 11.5–15.5)
WBC: 9.7 10*3/uL (ref 4.0–10.5)

## 2018-08-04 LAB — GLUCOSE, CAPILLARY
GLUCOSE-CAPILLARY: 109 mg/dL — AB (ref 70–99)
GLUCOSE-CAPILLARY: 112 mg/dL — AB (ref 70–99)
Glucose-Capillary: 139 mg/dL — ABNORMAL HIGH (ref 70–99)
Glucose-Capillary: 174 mg/dL — ABNORMAL HIGH (ref 70–99)

## 2018-08-04 LAB — BASIC METABOLIC PANEL
ANION GAP: 8 (ref 5–15)
BUN: 23 mg/dL (ref 8–23)
CALCIUM: 8.2 mg/dL — AB (ref 8.9–10.3)
CO2: 22 mmol/L (ref 22–32)
Chloride: 108 mmol/L (ref 98–111)
Creatinine, Ser: 1.8 mg/dL — ABNORMAL HIGH (ref 0.61–1.24)
GFR calc Af Amer: 38 mL/min — ABNORMAL LOW (ref >60)
GFR, EST NON AFRICAN AMERICAN: 33 mL/min — AB (ref >60)
GLUCOSE: 164 mg/dL — AB (ref 70–99)
POTASSIUM: 4.1 mmol/L (ref 3.5–5.1)
SODIUM: 138 mmol/L (ref 135–145)

## 2018-08-04 LAB — CK: CK TOTAL: 924 U/L — AB (ref 49–397)

## 2018-08-04 LAB — VITAMIN D 25 HYDROXY (VIT D DEFICIENCY, FRACTURES): Vit D, 25-Hydroxy: 45.1 ng/mL (ref 30.0–100.0)

## 2018-08-04 NOTE — Care Management Obs Status (Signed)
[place] NOTIFICATION   Patient Details  Name: Samuel Ryan MRN: [id number] Date of Birth: Aug 22, 1931   Medicare Observation Status Notification Given:  Yes    Madelin Weseman, Chauncey [place], RN 08/04/2018, 2:22 PM

## 2018-08-04 NOTE — Clinical Social Work Note (Signed)
Clinical Social Work Assessment  Patient Details  Name: Samuel Ryan MRN: [id number] Date of Birth: [birth year]  Date of referral:  08/04/18               Reason for consult:  Facility Placement                Permission sought to share information with:    Permission granted to share information::     Name::        Agency::     Relationship::     Contact Information:  son, Chase Caller, at bedside   Housing/Transportation Living arrangements for the past 2 months:  Single Family Home Source of Information:  Patient, Adult Children Patient Interpreter Needed:  None Criminal Activity/Legal Involvement Pertinent to Current Situation/Hospitalization:  No - Comment as needed Significant Relationships:  Adult Children Lives with:  Adult Children Do you feel safe going back to the place where you live?  Yes Need for family participation in patient care:  Yes (Comment)  Care giving concerns:  None identified at baseline.    Social Worker assessment / plan:  Patient ambulates with a walker and has [employer] caregivers four hours per day M-F.  [place] is agreeable to SNF for short term rehab.   Employment status:  Retired Nurse, adult PT Recommendations:  [place] / Referral to community resources:  [place]  Patient/Family's Response to care:  Patient and family are agreeable to short term rehab at [facility].   Patient/Family's Understanding of and Emotional Response to Diagnosis, Current Treatment, and Prognosis:  Patient and family verbalize understanding of patient's diagnosis, treatment and prognosis.   Emotional Assessment Appearance:  Appears stated age Attitude/Demeanor/Rapport:    Affect (typically observed):  Accepting, Calm, Hopeful Orientation:  Oriented to Self, Oriented to Place, Oriented to Situation Alcohol / Substance use:  Not Applicable Psych involvement (Current and /or in the community):  No  (Comment)  Discharge Needs  Concerns to be addressed:  Discharge Planning Concerns Readmission within the last 30 days:  No Current discharge risk:  None Barriers to Discharge:  Insurance Authorization   Ihor Gully, LCSW 08/04/2018, 3:53 PM

## 2018-08-04 NOTE — Clinical Social Work Placement (Signed)
   CLINICAL SOCIAL WORK PLACEMENT  NOTE  Date:  08/04/2018  Patient Details  Name: Samuel Ryan MRN: [id number] Date of Birth: January 24, 1931  Clinical Social Work is seeking post-discharge placement for this patient at the [place] level of care (*CSW will initial, date and re-position this form in  chart as items are completed):  Yes   Patient/family provided with [facility] Work Department's list of facilities offering this level of care within the geographic area requested by the patient (or if unable, by the patient's family).  Yes   Patient/family informed of their freedom to choose among providers that offer the needed level of care, that participate in Medicare, Medicaid or managed care program needed by the patient, have an available bed and are willing to accept the patient.  Yes   Patient/family informed of [facility]'s ownership interest in [facility] and [facility], as well as of the fact that they are under no obligation to receive care at these facilities.  PASRR submitted to EDS on       PASRR number received on       Existing PASRR number confirmed on 08/04/18     FL2 transmitted to all facilities in geographic area requested by pt/family on 08/04/18     FL2 transmitted to all facilities within larger geographic area on       Patient informed that his/her managed care company has contracts with or will negotiate with certain facilities, including the following:            Patient/family informed of bed offers received.  Patient chooses bed at       Physician recommends and patient chooses bed at      Patient to be transferred to   on  .  Patient to be transferred to facility by       Patient family notified on   of transfer.  Name of family member notified:        PHYSICIAN       Additional Comment:    _______________________________________________ Ihor Gully, LCSW 08/04/2018, 3:40 PM

## 2018-08-04 NOTE — NC FL2 (Signed)
[place] LEVEL OF CARE SCREENING TOOL     IDENTIFICATION  Patient Name: Samuel Ryan Birthdate: 09-Feb-1931 Sex: male Admission Date (Current Location): 08/03/2018  [facility] and [place] Number:  [employer] and Address:  [facility] [address] [place]      Provider Number: [id number]  Attending Physician Name and Address:  Isaac Bliss, Olam Idler*  Relative Name and Phone Number:       Current Level of Care: Other (Comment)(obs) Recommended Level of Care: [place] Prior Approval Number:    Date Approved/Denied:   PASRR Number: [id number])  Discharge Plan: SNF    Current Diagnoses: Patient Active Problem List   Diagnosis Date Noted  . Rhabdomyolysis 08/03/2018  . New onset of confusion 08/01/2018  . Morbid obesity ([place]) 06/12/2018  . Anemia 09/14/2017  . CKD (chronic kidney disease) stage 3, GFR 30-59 ml/min (HCC) 09/14/2017  . Atrial fibrillation ([place]) 09/14/2017  . Hypoalbuminemia 11/23/2016  . Hypoxia 10/29/2016  . Urinary frequency 09/17/2015  . At high risk for falls 03/30/2015  . Generalized osteoarthritis 07/16/2014  . Urinary incontinence 07/16/2014  . Pulmonary interstitial fibrosis ([place]) 07/03/2014  . Abnormal CXR (chest x-ray) 05/27/2014  . Thyroid mass of unclear etiology 05/07/2014  . Vitamin D deficiency 01/31/2014  . Allergic rhinitis 01/31/2014  . Diverticulosis of sigmoid colon 12/13/2012  . Right ventricular dysfunction 07/20/2011  . Heart disease, unspecified 07/20/2011  . Alzheimer's dementia without behavioral disturbance 09/12/2010  . Goiter 06/19/2009  . Essential hypertension 12/11/2008  . Diabetes mellitus, insulin dependent (IDDM), controlled ([place]) 09/05/2008  . Hyperlipidemia with target LDL less than 100 07/19/2008  . BPH (benign prostatic hyperplasia) 03/28/2008    Orientation RESPIRATION BLADDER Height & Weight     Place, Situation, Self  O2(3L) Incontinent Weight: 254 lb (115.2 kg) Height:  5\' 10"  (177.8 cm)  BEHAVIORAL SYMPTOMS/MOOD NEUROLOGICAL BOWEL NUTRITION STATUS      Continent (Car[place] modified )  AMBULATORY STATUS COMMUNICATION OF NEEDS Skin   Limited Assist Verbally Normal                       Personal Care Assistance Level of Assistance  Bathing, Feeding, Dressing Bathing Assistance: Limited assistance Feeding assistance: Independent Dressing Assistance: Limited assistance     Functional Limitations Info  Sight, Hearing, Speech Sight Info: Adequate Hearing Info: Adequate Speech Info: Adequate    SPECIAL CARE FACTORS FREQUENCY  PT (By licensed PT)     PT Frequency: 5x/week               Contractures Contractures Info: Not present    Additional Factors Info  Code Status, Allergies, Psychotropic Code Status Info: Full Code Allergies Info: Zosyn, Ace Inhibitors, Vancomycin Psychotropic Info: Zoloft         Current Medications (08/04/2018):  This is the current hospital active medication list Current Facility-Administered Medications  Medication Dose Route Frequency Provider Last Rate Last Dose  . 0.9 %  sodium chloride infusion   Intravenous Continuous Isaac Bliss, Rayford Halsted, MD 75 mL/hr at 08/04/18 1239    . acetaminophen (TYLENOL) tablet 650 mg  650 mg Oral Q6H PRN Isaac Bliss, Rayford Halsted, MD   650 mg at 08/03/18 2221   Or  . acetaminophen (TYLENOL) suppository 650 mg  650 mg Rectal Q6H PRN Isaac Bliss, Rayford Halsted, MD      . donepezil (ARICEPT) tablet 10 mg  10 mg Oral QHS Isaac Bliss, [place]  Y, MD   10 mg at 08/03/18 2221  . enoxaparin (LOVENOX) injection 40 mg  40 mg Subcutaneous Q24H Isaac Bliss, Rayford Halsted, MD   40 mg at 08/03/18 1857  . ferrous sulfate tablet 325 mg  325 mg Oral BID Isaac Bliss, Rayford Halsted, MD   325 mg at 08/04/18 0910  . furosemide (LASIX) tablet 20 mg  20 mg Oral Daily Isaac Bliss, Rayford Halsted, MD   20 mg at 08/04/18 0910  . insulin aspart  (novoLOG) injection 0-15 Units  0-15 Units Subcutaneous TID WC Isaac Bliss, Rayford Halsted, MD   3 Units at 08/04/18 1238  . insulin aspart (novoLOG) injection 0-5 Units  0-5 Units Subcutaneous QHS Isaac Bliss, Rayford Halsted, MD      . insulin aspart (novoLOG) injection 4 Units  4 Units Subcutaneous TID WC Isaac Bliss, Rayford Halsted, MD   4 Units at 08/04/18 1239  . insulin glargine (LANTUS) injection 20 Units  20 Units Subcutaneous QHS Isaac Bliss, Rayford Halsted, MD   20 Units at 08/03/18 2221  . loratadine (CLARITIN) tablet 10 mg  10 mg Oral q morning - 10a Isaac Bliss, Rayford Halsted, MD   10 mg at 08/04/18 0910  . ondansetron (ZOFRAN) tablet 4 mg  4 mg Oral Q6H PRN Isaac Bliss, Rayford Halsted, MD       Or  . ondansetron [facility]) injection 4 mg  4 mg Intravenous Q6H PRN Isaac Bliss, Rayford Halsted, MD      . oxybutynin (DITROPAN-XL) 24 hr tablet 5 mg  5 mg Oral q morning - 10a Isaac Bliss, Rayford Halsted, MD   5 mg at 08/04/18 0910  . pantoprazole (PROTONIX) EC tablet 40 mg  40 mg Oral Daily Isaac Bliss, Rayford Halsted, MD   40 mg at 08/04/18 0910  . pravastatin (PRAVACHOL) tablet 40 mg  40 mg Oral q1800 Isaac Bliss, Rayford Halsted, MD   40 mg at 08/03/18 1856  . senna-docusate (Senokot-S) tablet 1 tablet  1 tablet Oral QHS PRN Isaac Bliss, Rayford Halsted, MD      . sertraline (ZOLOFT) tablet 50 mg  50 mg Oral Daily Isaac Bliss, Rayford Halsted, MD   50 mg at 08/04/18 0910  . tamsulosin (FLOMAX) capsule 0.4 mg  0.4 mg Oral Daily Isaac Bliss, Rayford Halsted, MD   0.4 mg at 08/04/18 2992     Discharge Medications: Please see discharge summary for a list of discharge medications.  Relevant Imaging Results:  Relevant Lab Results:   Additional Information SSN [id number], Clydene Pugh, LCSW

## 2018-08-04 NOTE — Progress Notes (Signed)
PROGRESS NOTE    Samuel Ryan  NWG:[id number] DOB: 05-Jul-1931 DOA: 08/03/2018 PCP: [name], MD     Brief Narrative:  84 year old man admitted from home on 9/5 due to a fall.  He has a history of mild dementia and is fairly independent at home with a walker, also history of BPH, insulin-dependent diabetes and hyperlipidemia.  When his son came back from work at around 7:30 AM he found his father on the floor, was a little more confused than normal.  Was found to have acute renal failure and mild rhabdomyolysis and admission was requested for further evaluation and management.   Assessment & Plan:   Principal Problem:   Rhabdomyolysis Active Problems:   Diabetes mellitus, insulin dependent (IDDM), controlled (HCC)   Hyperlipidemia with target LDL less than 100   Alzheimer's dementia without behavioral disturbance   Essential hypertension   BPH (benign prostatic hyperplasia)   Vitamin D deficiency   Urinary incontinence   CKD (chronic kidney disease) stage 3, GFR 30-59 ml/min (HCC)   Acute metabolic encephalopathy -Suspect due to mild/moderate dehydration and worsening renal function.  This on top of baseline dementia -This has significantly improved, and per son at bedside he is back to his baseline. -Vitamin B12 is low normal at 242, check MMA to fully rule out B12 deficiency.  Vitamin D is within acceptable range at 45.  TSH is within normal limits at 0.562.  Fall at home -X-rays and CTs without acute fractures or changes. -PT evaluation is suggesting SNF, patient and son agree.  Social work has been notified.  Mild rhabdomyolysis -Due to fall with prolonged immobilization. -Continue IV fluids for next 24 hours, CPK is slightly improved today.  See no need to further monitor CPK levels.  Acute on chronic kidney disease stage III-IV -Baseline creatinine appears to be between 1.7 and 1.9. -Creatinine on admission was 2.09 and on 9/6 has dropped to his baseline of  1.8. -Would benefit from outpatient nephrology referral on discharge.  Alzheimer's dementia -Without behavioral disturbances, continue outpatient follow-up.   DVT prophylaxis: Lovenox Code Status: Full code Family Communication: Patient only Disposition Plan: SNF once insurance authorization is received  Consultants:   None  Procedures:   None  Antimicrobials:  Anti-infectives (From admission, onward)   None       Subjective: Lying in bed, no complaints other than severe weakness.  Objective: Vitals:   08/03/18 1508 08/03/18 1530 08/03/18 2214 08/04/18 0542  BP:  (!) 148/109 (!) 148/77 138/72  Pulse:  78 83 70  Resp:  (!) 25 18 18   Temp: 98.5 F (36.9 C)  99.8 F (37.7 C)   TempSrc:   Oral   SpO2:  100% 94% 98%  Weight:      Height:        Intake/Output Summary (Last 24 hours) at 08/04/2018 1154 Last data filed at 08/04/2018 0800 Gross per 24 hour  Intake 240 ml  Output -  Net 240 ml   Filed Weights   08/03/18 0930  Weight: 115.2 kg    Examination:  General exam: Alert, awake, oriented x 3 Respiratory system: Clear to auscultation. Respiratory effort normal. Cardiovascular system:RRR. No murmurs, rubs, gallops. Gastrointestinal system: Abdomen is nondistended, soft and nontender. No organomegaly or masses felt. Normal bowel sounds heard. Central nervous system: Alert and oriented. No focal neurological deficits. Extremities: No C/C/E, +pedal pulses Skin: No rashes, lesions or ulcers Psychiatry: Judgement and insight appear normal. Mood & affect appropriate.  Data Reviewed: I have personally reviewed following labs and imaging studies  CBC: Recent Labs  Lab 08/01/18 1419 08/03/18 0934 08/04/18 0439  WBC 5.0 7.7 9.7  NEUTROABS  --  6.0  --   HGB 11.9* 12.5* 11.5*  HCT 36.4* 38.1* 35.2*  MCV 92.6 93.8 93.6  PLT 175 173 253   Basic Metabolic Panel: Recent Labs  Lab 08/01/18 1419 08/03/18 0934 08/04/18 0439  NA 137 139 138  K 4.7  4.4 4.1  CL 104 105 108  CO2 27 26 22   GLUCOSE 91 173* 164*  BUN 20 23 23   CREATININE 1.98* 2.09* 1.80*  CALCIUM 8.6 9.1 8.2*   GFR: Estimated Creatinine Clearance: 38.2 mL/min (A) (by C-G formula based on SCr of 1.8 mg/dL (H)). Liver Function Tests: Recent Labs  Lab 08/01/18 1419 08/03/18 0934  AST 13 34  ALT 9 18  ALKPHOS  --  63  BILITOT 0.7 1.4*  PROT 6.9 8.0  ALBUMIN  --  3.7   No results for input(s): LIPASE, AMYLASE in the last 168 hours. No results for input(s): AMMONIA in the last 168 hours. Coagulation Profile: No results for input(s): INR, PROTIME in the last 168 hours. Cardiac Enzymes: Recent Labs  Lab 08/03/18 0934 08/04/18 0439  CKTOTAL 978* 924*   BNP (last 3 results) No results for input(s): PROBNP in the last 8760 hours. HbA1C: Recent Labs    08/03/18 1622  HGBA1C 8.0*   CBG: Recent Labs  Lab 08/03/18 2218 08/04/18 0816 08/04/18 1145  GLUCAP 191* 139* 174*   Lipid Profile: No results for input(s): CHOL, HDL, LDLCALC, TRIG, CHOLHDL, LDLDIRECT in the last 72 hours. Thyroid Function Tests: Recent Labs    08/03/18 0934  TSH 0.562   Anemia Panel: Recent Labs    08/03/18 0934  VITAMINB12 242   Urine analysis:    Component Value Date/Time   COLORURINE YELLOW 08/03/2018 [place] 08/03/2018 0934   LABSPEC 1.019 08/03/2018 0934   PHURINE 5.0 08/03/2018 0934   GLUCOSEU NEGATIVE 08/03/2018 0934   HGBUR MODERATE (A) 08/03/2018 0934   BILIRUBINUR NEGATIVE 08/03/2018 0934   BILIRUBINUR neg 12/12/2017 1516   KETONESUR NEGATIVE 08/03/2018 0934   PROTEINUR 30 (A) 08/03/2018 0934   UROBILINOGEN 1.0 12/12/2017 1516   UROBILINOGEN 0.2 06/02/2015 1310   NITRITE NEGATIVE 08/03/2018 0934   LEUKOCYTESUR NEGATIVE 08/03/2018 0934   Sepsis Labs: @LABRCNTIP (procalcitonin:4,lacticidven:4)  ) Recent Results (from the past 240 hour(s))  Urine Culture     Status: None   Collection Time: 08/02/18  9:37 AM  Result Value Ref Range  Status   MICRO NUMBER: [id number]  Final   SPECIMEN QUALITY: ADEQUATE  Final   Sample Source URINE  Final   STATUS: FINAL  Final   Result:   Final    Multiple organisms present, each less than 10,000 CFU/mL. These organisms, commonly found on external and internal genitalia, are considered to be colonizers. No further testing performed.         Radiology Studies: Dg Chest 2 View  Result Date: 08/03/2018 CLINICAL DATA:  Acute pain following fall last night. EXAM: CHEST - 2 VIEW COMPARISON:  08/01/2018 and prior exams FINDINGS: Cardiomegaly again noted. Mild bibasilar atelectasis/scarring again identified. There is no evidence of airspace disease, pleural effusion or pneumothorax. No acute bony abnormalities are identified. IMPRESSION: Cardiomegaly without evidence of acute cardiopulmonary disease. Mild bibasilar atelectasis/scarring. Electronically Signed   By: Margarette Canada M.D.   On: 08/03/2018 10:50   Dg  Pelvis 1-2 Views  Result Date: 08/03/2018 CLINICAL DATA:  Acute pelvic pain following fall last night. Initial encounter. EXAM: PELVIS - 1-2 VIEW COMPARISON:  09/18/2017 CT FINDINGS: There is no evidence of pelvic fracture or diastasis. No pelvic bone lesions are seen. IMPRESSION: Negative. Electronically Signed   By: Margarette Canada M.D.   On: 08/03/2018 10:54   Ct Head Wo Contrast  Result Date: 08/03/2018 CLINICAL DATA:  Altered level of consciousness.  Fall today. EXAM: CT HEAD WITHOUT CONTRAST CT CERVICAL SPINE WITHOUT CONTRAST TECHNIQUE: Multidetector CT imaging of the head and cervical spine was performed following the standard protocol without intravenous contrast. Multiplanar CT image reconstructions of the cervical spine were also generated. COMPARISON:  CT head 02/21/2017 FINDINGS: CT HEAD FINDINGS Brain: Moderate atrophy. Moderate chronic microvascular ischemic change. Negative for acute infarct. Negative for acute hemorrhage or mass. No midline shift. Extensive dural ossification anteriorly  the appears unchanged. Vascular: Negative for hyperdense vessel Skull: Negative Sinuses/Orbits: Negative Other: None CT CERVICAL SPINE FINDINGS Alignment: Straightening of the cervical lordosis with mild kyphosis at C3-4. Normal alignment. Skull base and vertebrae: Negative for fracture Soft tissues and spinal canal: 3 cm mass left lobe of the thyroid, incompletely evaluated on this study. Recommend thyroid ultrasound. Atherosclerotic calcification carotid artery. No enlarged lymph nodes in the neck. Disc levels: ACDF with anterior plate and screws C4 through C6. Disc degeneration and spurring at C2-3, C3-4, and C6-7. Disc and facet degeneration at C7-T1 and T1-2. Upper chest: Negative Other: None IMPRESSION: 1. Atrophy and chronic microvascular ischemia. No acute intracranial abnormality 2. ACDF C4 through C6. Negative for fracture. Cervical spondylosis as above 3. 3 cm mass left lobe of thyroid incompletely evaluated on this study. Recommend thyroid ultrasound. Electronically Signed   By: Franchot Gallo M.D.   On: 08/03/2018 11:20   Ct Cervical Spine Wo Contrast  Result Date: 08/03/2018 CLINICAL DATA:  Altered level of consciousness.  Fall today. EXAM: CT HEAD WITHOUT CONTRAST CT CERVICAL SPINE WITHOUT CONTRAST TECHNIQUE: Multidetector CT imaging of the head and cervical spine was performed following the standard protocol without intravenous contrast. Multiplanar CT image reconstructions of the cervical spine were also generated. COMPARISON:  CT head 02/21/2017 FINDINGS: CT HEAD FINDINGS Brain: Moderate atrophy. Moderate chronic microvascular ischemic change. Negative for acute infarct. Negative for acute hemorrhage or mass. No midline shift. Extensive dural ossification anteriorly the appears unchanged. Vascular: Negative for hyperdense vessel Skull: Negative Sinuses/Orbits: Negative Other: None CT CERVICAL SPINE FINDINGS Alignment: Straightening of the cervical lordosis with mild kyphosis at C3-4. Normal  alignment. Skull base and vertebrae: Negative for fracture Soft tissues and spinal canal: 3 cm mass left lobe of the thyroid, incompletely evaluated on this study. Recommend thyroid ultrasound. Atherosclerotic calcification carotid artery. No enlarged lymph nodes in the neck. Disc levels: ACDF with anterior plate and screws C4 through C6. Disc degeneration and spurring at C2-3, C3-4, and C6-7. Disc and facet degeneration at C7-T1 and T1-2. Upper chest: Negative Other: None IMPRESSION: 1. Atrophy and chronic microvascular ischemia. No acute intracranial abnormality 2. ACDF C4 through C6. Negative for fracture. Cervical spondylosis as above 3. 3 cm mass left lobe of thyroid incompletely evaluated on this study. Recommend thyroid ultrasound. Electronically Signed   By: Franchot Gallo M.D.   On: 08/03/2018 11:20   Dg Knee Complete 4 Views Right  Result Date: 08/03/2018 CLINICAL DATA:  [place]. Rt knee pain EXAM: RIGHT KNEE - COMPLETE 4+ VIEW COMPARISON:  07/27/2010 FINDINGS: Patient has undergone interval knee arthroplasty. The  hardware is well-seated. No evidence for acute fracture or subluxation. Trace joint effusion is present. There is popliteal artery calcification. IMPRESSION: 1. Postoperative changes. 2. Trace joint effusion. 3.  No evidence for acute  abnormality. Electronically Signed   By: Nolon Nations M.D.   On: 08/03/2018 13:36        Scheduled Meds: . donepezil  10 mg Oral QHS  . enoxaparin (LOVENOX) injection  40 mg Subcutaneous Q24H  . ferrous sulfate  325 mg Oral BID  . furosemide  20 mg Oral Daily  . insulin aspart  0-15 Units Subcutaneous TID WC  . insulin aspart  0-5 Units Subcutaneous QHS  . insulin aspart  4 Units Subcutaneous TID WC  . insulin glargine  20 Units Subcutaneous QHS  . loratadine  10 mg Oral q morning - 10a  . oxybutynin  5 mg Oral q morning - 10a  . pantoprazole  40 mg Oral Daily  . pravastatin  40 mg Oral q1800  . sertraline  50 mg Oral Daily  . tamsulosin   0.4 mg Oral Daily   Continuous Infusions: . sodium chloride 75 mL/hr at 08/03/18 1857     LOS: 0 days    Time spent: 25 minutes.    Lelon Frohlich, MD [facility] Pager [phone]  If 7PM-7AM, please contact night-coverage [email or web address] Password [email or web address] 08/04/2018, 11:54 AM

## 2018-08-04 NOTE — Evaluation (Signed)
Physical Therapy Evaluation Patient Details Name: Samuel Ryan MRN: [id number] DOB: 01-16-1931 Today's Date: 08/04/2018   History of Present Illness  84 yo male with cognitive decline was admitted for fall with rhabdomyolysis, atelectasis, has been referred PT for mobility.  PMHx:  cervical fusion ACDF C4-6, cardiomegaly, DVT, dementia, PNA, PE, DM, dementia, OA with R TKA, a-fib,   Clinical Impression  Pt was seen for evaluation of mobility and strength, noted his efforts to stand and he is requiring more help than previously.  He is at SNF level of mobiltiy but may not qualify, so will need 24/7 to manage at home.  Follow acutely for progression of strength and to increase independence and safety with gait.    Follow Up Recommendations SNF    Equipment Recommendations  None recommended by PT    Recommendations for Other Services       Precautions / Restrictions Precautions Precautions: Fall(ck O2 sats with all effort) Restrictions Weight Bearing Restrictions: No Other Position/Activity Restrictions: painful R knee due to fall despite lack of fracture      Mobility  Bed Mobility Overal bed mobility: Needs Assistance Bed Mobility: Supine to Sit;Sit to Supine     Supine to sit: Mod assist Sit to supine: Mod assist   General bed mobility comments: support to trunk to get OOB and legs to return to bed  Transfers Overall transfer level: Needs assistance Equipment used: Rolling walker (2 wheeled);1 person hand held assist Transfers: Sit to/from Stand Sit to Stand: Mod assist;Max assist         General transfer comment: max to initiate then mod to power up  Ambulation/Gait Ambulation/Gait assistance: Min assist Gait Distance (Feet): 5 Feet Assistive device: Rolling walker (2 wheeled);1 person hand held assist Gait Pattern/deviations: Step-to pattern;Decreased stride length;Wide base of support;Trunk flexed Gait velocity: reduced Gait velocity interpretation: <1.8  ft/sec, indicate of risk for recurrent falls General Gait Details: pt was only able to walk short trips due to his ability to tolerate with R knee and desaturations with effort  Stairs            Wheelchair Mobility    Modified Rankin (Stroke Patients Only)       Balance Overall balance assessment: Needs assistance;History of Falls Sitting-balance support: Feet supported;Single extremity supported Sitting balance-Leahy Scale: Fair     Standing balance support: Bilateral upper extremity supported;During functional activity Standing balance-Leahy Scale: Poor                               Pertinent Vitals/Pain Pain Assessment: Faces Faces Pain Scale: Hurts even more Pain Location: R knee with WB Pain Descriptors / Indicators: Tender Pain Intervention(s): Limited activity within patient's tolerance;Monitored during session;Repositioned    Home Living Family/patient expects to be discharged to:: Private residence Living Arrangements: Children Available Help at Discharge: Family;Available PRN/intermittently Type of Home: House Home Access: Level entry     Home Layout: One level Home Equipment: Walker - 2 wheels;Bedside commode      Prior Function Level of Independence: Independent with assistive device(s)         Comments: was not using O2 unless pt felt he needed it     Hand Dominance        Extremity/Trunk Assessment   Upper Extremity Assessment Upper Extremity Assessment: Generalized weakness    Lower Extremity Assessment Lower Extremity Assessment: Generalized weakness    Cervical / Trunk Assessment Cervical / Trunk  Assessment: Kyphotic  Communication   Communication: No difficulties  Cognition Arousal/Alertness: Awake/alert Behavior During Therapy: Flat affect Overall Cognitive Status: History of cognitive impairments - at baseline                                 General Comments: son spoke for pt about history       General Comments General comments (skin integrity, edema, etc.): O2 sats were a struggle to get a reading, has 94% baseline in bed on 2L O2, then 77% sitting, then 83% with sitting, then 43% with standing sidesteps.  Son is asking to push more but pt not tolerating    Exercises     Assessment/Plan    PT Assessment Patient needs continued PT services  PT Problem List Decreased strength;Decreased range of motion;Decreased balance;Decreased activity tolerance;Decreased mobility;Decreased coordination;Decreased cognition;Decreased knowledge of use of DME;Decreased safety awareness;Cardiopulmonary status limiting activity;Obesity       PT Treatment Interventions DME instruction;Gait training;Functional mobility training;Therapeutic activities;Therapeutic exercise;Balance training;Neuromuscular re-education;Patient/family education    PT Goals (Current goals can be found in the Care Plan section)  Acute Rehab PT Goals Patient Stated Goal: to get walking PT Goal Formulation: With patient/family Time For Goal Achievement: 08/18/18 Potential to Achieve Goals: Fair    Frequency Min 3X/week   Barriers to discharge Decreased caregiver support home alone at times    Co-evaluation               AM-PAC PT "6 Clicks" Daily Activity  Outcome Measure Difficulty turning over in bed (including adjusting bedclothes, sheets and blankets)?: Unable Difficulty moving from lying on back to sitting on the side of the bed? : Unable Difficulty sitting down on and standing up from a chair with arms (e.g., wheelchair, bedside commode, etc,.)?: Unable Help needed moving to and from a bed to chair (including a wheelchair)?: A Lot Help needed walking in hospital room?: A Little Help needed climbing 3-5 steps with a railing? : Total 6 Click Score: 9    End of Session Equipment Utilized During Treatment: Gait belt;Oxygen Activity Tolerance: Treatment limited secondary to medical complications  (Comment) Patient left: in bed;with call bell/phone within reach;with bed alarm set;with family/visitor present(applied O2 as ordered since pt cannot tolerate activity) Nurse Communication: Mobility status;Other (comment) PT Visit Diagnosis: Unsteadiness on feet (R26.81);Muscle weakness (generalized) (M62.81);History of falling (Z91.81);Difficulty in walking, not elsewhere classified (R26.2)    Time: 1031-5945 PT Time Calculation (min) (ACUTE ONLY): 41 min   Charges:   PT Evaluation $PT Eval Moderate Complexity: 1 Mod PT Treatments $Gait Training: 8-22 mins $Therapeutic Exercise: 8-22 mins       Ramond Dial 08/04/2018, 10:36 AM   10:38 AM, 08/04/18 Mee Hives, PT, MS Physical Therapist - [place] [phone] (Office)

## 2018-08-04 NOTE — Clinical Social Work Placement (Signed)
   CLINICAL SOCIAL WORK PLACEMENT  NOTE  Date:  08/04/2018  Patient Details  Name: Samuel Ryan MRN: [id number] Date of Birth: [birth year]  Clinical Social Work is seeking post-discharge placement for this patient at the [place] level of care (*CSW will initial, date and re-position this form in  chart as items are completed):  Yes   Patient/family provided with [place] Work Department's list of facilities offering this level of care within the geographic area requested by the patient (or if unable, by the patient's family).  Yes   Patient/family informed of their freedom to choose among providers that offer the needed level of care, that participate in Medicare, Medicaid or managed care program needed by the patient, have an available bed and are willing to accept the patient.  Yes   Patient/family informed of [place]'s ownership interest in [facility] and [facility], as well as of the fact that they are under no obligation to receive care at these facilities.  PASRR submitted to EDS on       PASRR number received on       Existing PASRR number confirmed on 08/04/18     FL2 transmitted to all facilities in geographic area requested by pt/family on 08/04/18     FL2 transmitted to all facilities within larger geographic area on       Patient informed that his/her managed care company has contracts with or will negotiate with certain facilities, including the following:        Yes   Patient/family informed of bed offers received.  Patient chooses bed at [facility]     Physician recommends and patient chooses bed at      Patient to be transferred to [facility] on  .  Patient to be transferred to facility by       Patient family notified on   of transfer.  Name of family member notified:        PHYSICIAN       Additional Comment:    _______________________________________________ Ihor Gully,  LCSW 08/04/2018, 4:48 PM

## 2018-08-05 DIAGNOSIS — I10 Essential (primary) hypertension: Secondary | ICD-10-CM | POA: Diagnosis not present

## 2018-08-05 DIAGNOSIS — M6282 Rhabdomyolysis: Secondary | ICD-10-CM | POA: Diagnosis not present

## 2018-08-05 DIAGNOSIS — N401 Enlarged prostate with lower urinary tract symptoms: Secondary | ICD-10-CM | POA: Diagnosis not present

## 2018-08-05 DIAGNOSIS — R338 Other retention of urine: Secondary | ICD-10-CM | POA: Diagnosis not present

## 2018-08-05 DIAGNOSIS — N183 Chronic kidney disease, stage 3 (moderate): Secondary | ICD-10-CM | POA: Diagnosis not present

## 2018-08-05 DIAGNOSIS — F0280 Dementia in other diseases classified elsewhere without behavioral disturbance: Secondary | ICD-10-CM | POA: Diagnosis not present

## 2018-08-05 DIAGNOSIS — G308 Other Alzheimer's disease: Secondary | ICD-10-CM | POA: Diagnosis not present

## 2018-08-05 DIAGNOSIS — G309 Alzheimer's disease, unspecified: Secondary | ICD-10-CM | POA: Diagnosis not present

## 2018-08-05 LAB — GLUCOSE, CAPILLARY
Glucose-Capillary: 75 mg/dL (ref 70–99)
Glucose-Capillary: 108 mg/dL — ABNORMAL HIGH (ref 70–99)
Glucose-Capillary: 168 mg/dL — ABNORMAL HIGH (ref 70–99)
Glucose-Capillary: 165 mg/dL — ABNORMAL HIGH (ref 70–99)

## 2018-08-05 NOTE — Progress Notes (Signed)
PROGRESS NOTE    Samuel Ryan  SWH:[id number] DOB: 08-26-1931 DOA: 08/03/2018 PCP: [name], MD     Brief Narrative:  84 year old man admitted from home on 9/5 due to a fall.  He has a history of mild dementia and is fairly independent at home with a walker, also history of BPH, insulin-dependent diabetes and hyperlipidemia.  When his son came back from work at around 7:30 AM he found his father on the floor, was a little more confused than normal.  Was found to have acute renal failure and mild rhabdomyolysis and admission was requested for further evaluation and management.   Assessment & Plan:   Principal Problem:   Rhabdomyolysis Active Problems:   Diabetes mellitus, insulin dependent (IDDM), controlled (HCC)   Hyperlipidemia with target LDL less than 100   Alzheimer's dementia without behavioral disturbance   Essential hypertension   BPH (benign prostatic hyperplasia)   Vitamin D deficiency   Urinary incontinence   CKD (chronic kidney disease) stage 3, GFR 30-59 ml/min (HCC)   Acute metabolic encephalopathy -Suspect due to mild/moderate dehydration and worsening renal function.  This on top of baseline dementia -This has significantly improved, and per son at bedside he is back to his baseline. -Vitamin B12 is low normal at 242, check MMA (pending today) to fully rule out B12 deficiency.  Vitamin D is within acceptable range at 45.  TSH is within normal limits at 0.562.  Fall at home -X-rays and CTs without acute fractures or changes. -PT evaluation is suggesting SNF, patient and son agree.  Social work has been notified. -Patient is currently awaiting insurance authorization to proceed with short-term rehab at skilled nursing.  Mild rhabdomyolysis -Due to fall with prolonged immobilization. -Continue IV fluids for next 24 hours, CPK is slightly improved today.  See no need to further monitor CPK levels.  Acute on chronic kidney disease stage III-IV -Baseline  creatinine appears to be between 1.7 and 1.9. -Creatinine on admission was 2.09 and on 9/6 has dropped to his baseline of 1.8. -Would benefit from outpatient nephrology referral on discharge. -Recheck renal function in a.m.  Alzheimer's dementia -Without behavioral disturbances, continue outpatient follow-up.   DVT prophylaxis: Lovenox Code Status: Full code Family Communication: Son and caregiver at bedside updated on plan of care and all questions answered Disposition Plan: SNF once insurance authorization is received  Consultants:   None  Procedures:   None  Antimicrobials:  Anti-infectives (From admission, onward)   None       Subjective: In bed, no complaints other than severe weakness.  Objective: Vitals:   08/04/18 0542 08/04/18 1855 08/04/18 2107 08/05/18 0539  BP: 138/72 110/66 124/78 (!) 150/77  Pulse: 70 91 84 (!) 58  Resp: 18 16 16 18   Temp:  98 F (36.7 C) 98.8 F (37.1 C) (!) 97.3 F (36.3 C)  TempSrc:  Oral Oral Oral  SpO2: 98% 97% 100% 98%  Weight:      Height:        Intake/Output Summary (Last 24 hours) at 08/05/2018 1058 Last data filed at 08/05/2018 1030 Gross per 24 hour  Intake 2438.75 ml  Output -  Net 2438.75 ml   Filed Weights   08/03/18 0930  Weight: 115.2 kg    Examination:  General exam: Alert, awake, oriented x 3 Respiratory system: Clear to auscultation. Respiratory effort normal. Cardiovascular system:RRR. No murmurs, rubs, gallops. Gastrointestinal system: Abdomen is nondistended, soft and nontender. No organomegaly or masses felt. Normal bowel  sounds heard. Central nervous system: Alert and oriented. No focal neurological deficits. Extremities: No C/C/E, +pedal pulses Skin: No rashes, lesions or ulcers Psychiatry: Judgement and insight appear normal. Mood & affect appropriate.      Data Reviewed: I have personally reviewed following labs and imaging studies  CBC: Recent Labs  Lab 08/01/18 1419 08/03/18 0934  08/04/18 0439  WBC 5.0 7.7 9.7  NEUTROABS  --  6.0  --   HGB 11.9* 12.5* 11.5*  HCT 36.4* 38.1* 35.2*  MCV 92.6 93.8 93.6  PLT 175 173 220   Basic Metabolic Panel: Recent Labs  Lab 08/01/18 1419 08/03/18 0934 08/04/18 0439  NA 137 139 138  K 4.7 4.4 4.1  CL 104 105 108  CO2 27 26 22   GLUCOSE 91 173* 164*  BUN 20 23 23   CREATININE 1.98* 2.09* 1.80*  CALCIUM 8.6 9.1 8.2*   GFR: Estimated Creatinine Clearance: 38.2 mL/min (A) (by C-G formula based on SCr of 1.8 mg/dL (H)). Liver Function Tests: Recent Labs  Lab 08/01/18 1419 08/03/18 0934  AST 13 34  ALT 9 18  ALKPHOS  --  63  BILITOT 0.7 1.4*  PROT 6.9 8.0  ALBUMIN  --  3.7   No results for input(s): LIPASE, AMYLASE in the last 168 hours. No results for input(s): AMMONIA in the last 168 hours. Coagulation Profile: No results for input(s): INR, PROTIME in the last 168 hours. Cardiac Enzymes: Recent Labs  Lab 08/03/18 0934 08/04/18 0439  CKTOTAL 978* 924*   BNP (last 3 results) No results for input(s): PROBNP in the last 8760 hours. HbA1C: Recent Labs    08/03/18 1622  HGBA1C 8.0*   CBG: Recent Labs  Lab 08/04/18 0816 08/04/18 1145 08/04/18 1652 08/04/18 2232 08/05/18 0742  GLUCAP 139* 174* 109* 112* 75   Lipid Profile: No results for input(s): CHOL, HDL, LDLCALC, TRIG, CHOLHDL, LDLDIRECT in the last 72 hours. Thyroid Function Tests: Recent Labs    08/03/18 0934  TSH 0.562   Anemia Panel: Recent Labs    08/03/18 0934  VITAMINB12 242   Urine analysis:    Component Value Date/Time   COLORURINE YELLOW 08/03/2018 [place] 08/03/2018 0934   LABSPEC 1.019 08/03/2018 0934   PHURINE 5.0 08/03/2018 0934   GLUCOSEU NEGATIVE 08/03/2018 0934   HGBUR MODERATE (A) 08/03/2018 0934   BILIRUBINUR NEGATIVE 08/03/2018 0934   BILIRUBINUR neg 12/12/2017 1516   KETONESUR NEGATIVE 08/03/2018 0934   PROTEINUR 30 (A) 08/03/2018 0934   UROBILINOGEN 1.0 12/12/2017 1516   UROBILINOGEN  0.2 06/02/2015 1310   NITRITE NEGATIVE 08/03/2018 0934   LEUKOCYTESUR NEGATIVE 08/03/2018 0934   Sepsis Labs: @LABRCNTIP (procalcitonin:4,lacticidven:4)  ) Recent Results (from the past 240 hour(s))  Urine Culture     Status: None   Collection Time: 08/02/18  9:37 AM  Result Value Ref Range Status   MICRO NUMBER: [id number]  Final   SPECIMEN QUALITY: ADEQUATE  Final   Sample Source URINE  Final   STATUS: FINAL  Final   Result:   Final    Multiple organisms present, each less than 10,000 CFU/mL. These organisms, commonly found on external and internal genitalia, are considered to be colonizers. No further testing performed.         Radiology Studies: Dg Knee Complete 4 Views Right  Result Date: 08/03/2018 CLINICAL DATA:  [place]. Rt knee pain EXAM: RIGHT KNEE - COMPLETE 4+ VIEW COMPARISON:  07/27/2010 FINDINGS: Patient has undergone interval knee arthroplasty. The hardware is well-seated.  No evidence for acute fracture or subluxation. Trace joint effusion is present. There is popliteal artery calcification. IMPRESSION: 1. Postoperative changes. 2. Trace joint effusion. 3.  No evidence for acute  abnormality. Electronically Signed   By: Nolon Nations M.D.   On: 08/03/2018 13:36        Scheduled Meds: . donepezil  10 mg Oral QHS  . enoxaparin (LOVENOX) injection  40 mg Subcutaneous Q24H  . ferrous sulfate  325 mg Oral BID  . furosemide  20 mg Oral Daily  . insulin aspart  0-15 Units Subcutaneous TID WC  . insulin aspart  0-5 Units Subcutaneous QHS  . insulin aspart  4 Units Subcutaneous TID WC  . insulin glargine  20 Units Subcutaneous QHS  . loratadine  10 mg Oral q morning - 10a  . oxybutynin  5 mg Oral q morning - 10a  . pantoprazole  40 mg Oral Daily  . pravastatin  40 mg Oral q1800  . sertraline  50 mg Oral Daily  . tamsulosin  0.4 mg Oral Daily   Continuous Infusions: . sodium chloride 75 mL/hr at 08/05/18 0426     LOS: 0 days    Time spent: 25  minutes.    Lelon Frohlich, MD [facility] Pager [phone]  If 7PM-7AM, please contact night-coverage [email or web address] Password [email or web address] 08/05/2018, 10:58 AM

## 2018-08-06 DIAGNOSIS — I10 Essential (primary) hypertension: Secondary | ICD-10-CM

## 2018-08-06 DIAGNOSIS — M6282 Rhabdomyolysis: Secondary | ICD-10-CM | POA: Diagnosis not present

## 2018-08-06 DIAGNOSIS — N401 Enlarged prostate with lower urinary tract symptoms: Secondary | ICD-10-CM | POA: Diagnosis not present

## 2018-08-06 DIAGNOSIS — R338 Other retention of urine: Secondary | ICD-10-CM | POA: Diagnosis not present

## 2018-08-06 DIAGNOSIS — G308 Other Alzheimer's disease: Secondary | ICD-10-CM | POA: Diagnosis not present

## 2018-08-06 DIAGNOSIS — N183 Chronic kidney disease, stage 3 (moderate): Secondary | ICD-10-CM

## 2018-08-06 DIAGNOSIS — F0280 Dementia in other diseases classified elsewhere without behavioral disturbance: Secondary | ICD-10-CM | POA: Diagnosis not present

## 2018-08-06 DIAGNOSIS — G309 Alzheimer's disease, unspecified: Secondary | ICD-10-CM | POA: Diagnosis not present

## 2018-08-06 LAB — CBC
HCT: 34.4 % — ABNORMAL LOW (ref 39.0–52.0)
Hemoglobin: 11.2 g/dL — ABNORMAL LOW (ref 13.0–17.0)
MCH: 30.5 pg (ref 26.0–34.0)
MCHC: 32.6 g/dL (ref 30.0–36.0)
MCV: 93.7 fL (ref 78.0–100.0)
PLATELETS: 169 10*3/uL (ref 150–400)
RBC: 3.67 MIL/uL — ABNORMAL LOW (ref 4.22–5.81)
RDW: 14.2 % (ref 11.5–15.5)
WBC: 5.6 10*3/uL (ref 4.0–10.5)

## 2018-08-06 LAB — BASIC METABOLIC PANEL
Anion gap: 8 (ref 5–15)
BUN: 20 mg/dL (ref 8–23)
CALCIUM: 8.1 mg/dL — AB (ref 8.9–10.3)
CO2: 26 mmol/L (ref 22–32)
CREATININE: 1.55 mg/dL — AB (ref 0.61–1.24)
Chloride: 106 mmol/L (ref 98–111)
GFR calc Af Amer: 45 mL/min — ABNORMAL LOW (ref >60)
GFR, EST NON AFRICAN AMERICAN: 39 mL/min — AB (ref >60)
Glucose, Bld: 116 mg/dL — ABNORMAL HIGH (ref 70–99)
Potassium: 4.5 mmol/L (ref 3.5–5.1)
Sodium: 140 mmol/L (ref 135–145)

## 2018-08-06 LAB — GLUCOSE, CAPILLARY
GLUCOSE-CAPILLARY: 109 mg/dL — AB (ref 70–99)
GLUCOSE-CAPILLARY: 109 mg/dL — AB (ref 70–99)
Glucose-Capillary: 115 mg/dL — ABNORMAL HIGH (ref 70–99)
Glucose-Capillary: 163 mg/dL — ABNORMAL HIGH (ref 70–99)

## 2018-08-06 NOTE — Progress Notes (Signed)
PROGRESS NOTE    Samuel Ryan  JME:[id number] DOB: 1931/07/20 DOA: 08/03/2018 PCP: [name], MD     Brief Narrative:  84 year old man admitted from home on 9/5 due to a fall.  He has a history of mild dementia and is fairly independent at home with a walker, also history of BPH, insulin-dependent diabetes and hyperlipidemia.  When his son came back from work at around 7:30 AM he found his father on the floor, was a little more confused than normal.  Was found to have acute renal failure and mild rhabdomyolysis and admission was requested for further evaluation and management.   Assessment & Plan:   Principal Problem:   Rhabdomyolysis Active Problems:   Diabetes mellitus, insulin dependent (IDDM), controlled (HCC)   Hyperlipidemia with target LDL less than 100   Alzheimer's dementia without behavioral disturbance   Essential hypertension   BPH (benign prostatic hyperplasia)   Vitamin D deficiency   Urinary incontinence   CKD (chronic kidney disease) stage 3, GFR 30-59 ml/min (HCC)   Acute metabolic encephalopathy -Suspect due to mild/moderate dehydration and worsening renal function.  This on top of baseline dementia -This has significantly improved, and per son at bedside he is back to his baseline. -Vitamin B12 is low normal at 242, check MMA (pending today) to fully rule out B12 deficiency.  Vitamin D is within acceptable range at 45.  TSH is within normal limits at 0.562.  Fall at home -X-rays and CTs without acute fractures or changes. -PT evaluation is suggesting SNF, patient and son agree.  Social work has been notified. -Patient is currently awaiting insurance authorization to proceed with short-term rehab at skilled nursing.  Mild rhabdomyolysis -Due to fall with prolonged immobilization. -Continue IV fluids for next 24 hours, CPK is slightly improved today.  See no need to further monitor CPK levels.  Acute on chronic kidney disease stage III-IV -Baseline  creatinine appears to be between 1.7 and 1.9. -Creatinine on admission was 2.09 and on 9/6 has dropped to his baseline of 1.8.  Further down to 1.55 on 1/8. -Would benefit from outpatient nephrology referral on discharge.  Alzheimer's dementia -Without behavioral disturbances, continue outpatient follow-up.   DVT prophylaxis: Lovenox Code Status: Full code Family Communication: Son at bedside updated on plan of care and all questions answered Disposition Plan: SNF once insurance authorization is received  Consultants:   None  Procedures:   None  Antimicrobials:  Anti-infectives (From admission, onward)   None       Subjective: No complaints, eating lunch.  Objective: Vitals:   08/05/18 1525 08/05/18 2128 08/06/18 0614 08/06/18 1406  BP: 130/72 124/72 (!) 165/90 134/71  Pulse: (!) 59 68 70 84  Resp: 16 16 16 16   Temp: 97.9 F (36.6 C) 99 F (37.2 C) 97.9 F (36.6 C) 98.3 F (36.8 C)  TempSrc: Oral Oral Oral Oral  SpO2: 94% 91%  96%  Weight:      Height:        Intake/Output Summary (Last 24 hours) at 08/06/2018 1421 Last data filed at 08/06/2018 1000 Gross per 24 hour  Intake 1957.5 ml  Output 1600 ml  Net 357.5 ml   Filed Weights   08/03/18 0930  Weight: 115.2 kg    Examination:  General exam: Alert, awake, oriented x 3 Respiratory system: Clear to auscultation. Respiratory effort normal. Cardiovascular system:RRR. No murmurs, rubs, gallops. Gastrointestinal system: Abdomen is nondistended, soft and nontender. No organomegaly or masses felt. Normal bowel sounds  heard. Central nervous system: Alert and oriented. No focal neurological deficits. Extremities: No C/C/E, +pedal pulses Skin: No rashes, lesions or ulcers Psychiatry: Judgement and insight appear normal. Mood & affect appropriate.      Data Reviewed: I have personally reviewed following labs and imaging studies  CBC: Recent Labs  Lab 08/01/18 1419 08/03/18 0934 08/04/18 0439  08/06/18 0604  WBC 5.0 7.7 9.7 5.6  NEUTROABS  --  6.0  --   --   HGB 11.9* 12.5* 11.5* 11.2*  HCT 36.4* 38.1* 35.2* 34.4*  MCV 92.6 93.8 93.6 93.7  PLT 175 173 161 355   Basic Metabolic Panel: Recent Labs  Lab 08/01/18 1419 08/03/18 0934 08/04/18 0439 08/06/18 0604  NA 137 139 138 140  K 4.7 4.4 4.1 4.5  CL 104 105 108 106  CO2 27 26 22 26   GLUCOSE 91 173* 164* 116*  BUN 20 23 23 20   CREATININE 1.98* 2.09* 1.80* 1.55*  CALCIUM 8.6 9.1 8.2* 8.1*   GFR: Estimated Creatinine Clearance: 44.3 mL/min (A) (by C-G formula based on SCr of 1.55 mg/dL (H)). Liver Function Tests: Recent Labs  Lab 08/01/18 1419 08/03/18 0934  AST 13 34  ALT 9 18  ALKPHOS  --  63  BILITOT 0.7 1.4*  PROT 6.9 8.0  ALBUMIN  --  3.7   No results for input(s): LIPASE, AMYLASE in the last 168 hours. No results for input(s): AMMONIA in the last 168 hours. Coagulation Profile: No results for input(s): INR, PROTIME in the last 168 hours. Cardiac Enzymes: Recent Labs  Lab 08/03/18 0934 08/04/18 0439  CKTOTAL 978* 924*   BNP (last 3 results) No results for input(s): PROBNP in the last 8760 hours. HbA1C: Recent Labs    08/03/18 1622  HGBA1C 8.0*   CBG: Recent Labs  Lab 08/05/18 1118 08/05/18 1622 08/05/18 2127 08/06/18 0749 08/06/18 1201  GLUCAP 108* 168* 165* 109* 109*   Lipid Profile: No results for input(s): CHOL, HDL, LDLCALC, TRIG, CHOLHDL, LDLDIRECT in the last 72 hours. Thyroid Function Tests: No results for input(s): TSH, T4TOTAL, FREET4, T3FREE, THYROIDAB in the last 72 hours. Anemia Panel: No results for input(s): VITAMINB12, FOLATE, FERRITIN, TIBC, IRON, RETICCTPCT in the last 72 hours. Urine analysis:    Component Value Date/Time   COLORURINE YELLOW 08/03/2018 [place] 08/03/2018 0934   LABSPEC 1.019 08/03/2018 0934   PHURINE 5.0 08/03/2018 0934   GLUCOSEU NEGATIVE 08/03/2018 0934   HGBUR MODERATE (A) 08/03/2018 0934   BILIRUBINUR NEGATIVE  08/03/2018 0934   BILIRUBINUR neg 12/12/2017 1516   KETONESUR NEGATIVE 08/03/2018 0934   PROTEINUR 30 (A) 08/03/2018 0934   UROBILINOGEN 1.0 12/12/2017 1516   UROBILINOGEN 0.2 06/02/2015 1310   NITRITE NEGATIVE 08/03/2018 0934   LEUKOCYTESUR NEGATIVE 08/03/2018 0934   Sepsis Labs: @LABRCNTIP (procalcitonin:4,lacticidven:4)  ) Recent Results (from the past 240 hour(s))  Urine Culture     Status: None   Collection Time: 08/02/18  9:37 AM  Result Value Ref Range Status   MICRO NUMBER: [id number]  Final   SPECIMEN QUALITY: ADEQUATE  Final   Sample Source URINE  Final   STATUS: FINAL  Final   Result:   Final    Multiple organisms present, each less than 10,000 CFU/mL. These organisms, commonly found on external and internal genitalia, are considered to be colonizers. No further testing performed.         Radiology Studies: No results found.      Scheduled Meds: . donepezil  10  mg Oral QHS  . enoxaparin (LOVENOX) injection  40 mg Subcutaneous Q24H  . ferrous sulfate  325 mg Oral BID  . furosemide  20 mg Oral Daily  . insulin aspart  0-15 Units Subcutaneous TID WC  . insulin aspart  0-5 Units Subcutaneous QHS  . insulin aspart  4 Units Subcutaneous TID WC  . insulin glargine  20 Units Subcutaneous QHS  . loratadine  10 mg Oral q morning - 10a  . oxybutynin  5 mg Oral q morning - 10a  . pantoprazole  40 mg Oral Daily  . pravastatin  40 mg Oral q1800  . sertraline  50 mg Oral Daily  . tamsulosin  0.4 mg Oral Daily   Continuous Infusions: . sodium chloride 75 mL/hr at 08/06/18 0911     LOS: 0 days    Time spent: 25 minutes.    Lelon Frohlich, MD [facility] Pager [phone]  If 7PM-7AM, please contact night-coverage [email or web address] Password [email or web address] 08/06/2018, 2:21 PM

## 2018-08-07 DIAGNOSIS — I10 Essential (primary) hypertension: Secondary | ICD-10-CM | POA: Diagnosis not present

## 2018-08-07 DIAGNOSIS — N401 Enlarged prostate with lower urinary tract symptoms: Secondary | ICD-10-CM | POA: Diagnosis not present

## 2018-08-07 DIAGNOSIS — R338 Other retention of urine: Secondary | ICD-10-CM | POA: Diagnosis not present

## 2018-08-07 DIAGNOSIS — N183 Chronic kidney disease, stage 3 (moderate): Secondary | ICD-10-CM | POA: Diagnosis not present

## 2018-08-07 DIAGNOSIS — G308 Other Alzheimer's disease: Secondary | ICD-10-CM | POA: Diagnosis not present

## 2018-08-07 DIAGNOSIS — M6282 Rhabdomyolysis: Secondary | ICD-10-CM | POA: Diagnosis not present

## 2018-08-07 DIAGNOSIS — F0280 Dementia in other diseases classified elsewhere without behavioral disturbance: Secondary | ICD-10-CM | POA: Diagnosis not present

## 2018-08-07 DIAGNOSIS — G309 Alzheimer's disease, unspecified: Secondary | ICD-10-CM | POA: Diagnosis not present

## 2018-08-07 LAB — GLUCOSE, CAPILLARY
Glucose-Capillary: 81 mg/dL (ref 70–99)
Glucose-Capillary: 101 mg/dL — ABNORMAL HIGH (ref 70–99)
Glucose-Capillary: 102 mg/dL — ABNORMAL HIGH (ref 70–99)
Glucose-Capillary: 100 mg/dL — ABNORMAL HIGH (ref 70–99)

## 2018-08-07 NOTE — Progress Notes (Signed)
Physical Therapy Treatment Patient Details Name: Samuel Ryan MRN: [id number] DOB: 09-23-1931 Today's Date: 08/07/2018    History of Present Illness 84 yo male with cognitive decline was admitted for fall with rhabdomyolysis, atelectasis, has been referred PT for mobility.  PMHx:  cervical fusion ACDF C4-6, cardiomegaly, DVT, dementia, PNA, PE, DM, dementia, OA with R TKA, a-fib,     PT Comments    Pt was seen for continued therapy with efforts to stand and exercise being more tolerated by pt and having less decline in O2 sat with effort.  Pt is awaiting permission from ins to transition to rehab, and so will continue on with gait and transfers as tolerated to increase safe independent movement at home.  Pt will continue to need monitoring of O2 sats as he is still declining with effort.  Follow Up Recommendations  SNF     Equipment Recommendations  None recommended by PT    Recommendations for Other Services       Precautions / Restrictions Precautions Precautions: Fall(telmeetry, Ck O2 sats with all effort) Restrictions Weight Bearing Restrictions: No    Mobility  Bed Mobility Overal bed mobility: Needs Assistance Bed Mobility: Supine to Sit     Supine to sit: Mod assist     General bed mobility comments: supported trunk to sit up min assist and mod to pivot hips  Transfers Overall transfer level: Needs assistance Equipment used: Rolling walker (2 wheeled);1 person hand held assist Transfers: Sit to/from Stand Sit to Stand: Mod assist         General transfer comment: improved with verbal prep to count  Ambulation/Gait Ambulation/Gait assistance: Min assist Gait Distance (Feet): 12 Feet(6 x 2) Assistive device: Rolling walker (2 wheeled);1 person hand held assist Gait Pattern/deviations: Step-to pattern;Decreased stride length;Wide base of support;Trunk flexed Gait velocity: reduced Gait velocity interpretation: <1.8 ft/sec, indicate of risk for recurrent  falls General Gait Details: better tolerance for gait, O2 sat 87% then recovered very quickly   Stairs             Wheelchair Mobility    Modified Rankin (Stroke Patients Only)       Balance Overall balance assessment: Needs assistance Sitting-balance support: Feet supported Sitting balance-Leahy Scale: Good     Standing balance support: Bilateral upper extremity supported;During functional activity Standing balance-Leahy Scale: Poor Standing balance comment: fair with RW standing at times                            Cognition Arousal/Alertness: Awake/alert Behavior During Therapy: WFL for tasks assessed/performed Overall Cognitive Status: History of cognitive impairments - at baseline                                        Exercises General Exercises - Lower Extremity Ankle Circles/Pumps: AAROM;Both;5 reps Long Arc Quad: Strengthening;Both;10 reps Heel Slides: Strengthening;Both;10 reps Hip ABduction/ADduction: AROM;AAROM;Both;10 reps Straight Leg Raises: AAROM;Both;10 reps Hip Flexion/Marching: AROM;Both;10 reps    General Comments General comments (skin integrity, edema, etc.): O2 sats more controlled      Pertinent Vitals/Pain Pain Assessment: No/denies pain    Home Living                      Prior Function            PT Goals (current goals can now  be found in the care plan section) Acute Rehab PT Goals Patient Stated Goal: to get walking Progress towards PT goals: Progressing toward goals    Frequency    Min 3X/week      PT Plan Current plan remains appropriate    Co-evaluation              AM-PAC PT "6 Clicks" Daily Activity  Outcome Measure  Difficulty turning over in bed (including adjusting bedclothes, sheets and blankets)?: A Little Difficulty moving from lying on back to sitting on the side of the bed? : Unable Difficulty sitting down on and standing up from a chair with arms (e.g.,  wheelchair, bedside commode, etc,.)?: Unable Help needed moving to and from a bed to chair (including a wheelchair)?: A Little Help needed walking in hospital room?: A Little Help needed climbing 3-5 steps with a railing? : A Lot 6 Click Score: 13    End of Session Equipment Utilized During Treatment: Gait belt;Oxygen Activity Tolerance: Patient limited by fatigue;Treatment limited secondary to medical complications (Comment) Patient left: in chair;with call bell/phone within reach;with chair alarm set;with family/visitor present Nurse Communication: Mobility status PT Visit Diagnosis: Unsteadiness on feet (R26.81);Muscle weakness (generalized) (M62.81);History of falling (Z91.81);Difficulty in walking, not elsewhere classified (R26.2)     Time: 9357-0177 PT Time Calculation (min) (ACUTE ONLY): 31 min  Charges:  $Gait Training: 8-22 mins $Therapeutic Exercise: 8-22 mins                     Ramond Dial 08/07/2018, 12:38 PM   12:40 PM, 08/07/18 Mee Hives, PT, MS Physical Therapist - [place] [phone] (Office)

## 2018-08-07 NOTE — Progress Notes (Signed)
PROGRESS NOTE    Samuel Ryan  SNK:[id number] DOB: 03/24/1931 DOA: 08/03/2018 PCP: [name], MD     Brief Narrative:  84 year old man admitted from home on 9/5 due to a fall.  He has a history of mild dementia and is fairly independent at home with a walker, also history of BPH, insulin-dependent diabetes and hyperlipidemia.  When his son came back from work at around 7:30 AM he found his father on the floor, was a little more confused than normal.  Was found to have acute renal failure and mild rhabdomyolysis and admission was requested for further evaluation and management.   Assessment & Plan:   Principal Problem:   Rhabdomyolysis Active Problems:   Diabetes mellitus, insulin dependent (IDDM), controlled (HCC)   Hyperlipidemia with target LDL less than 100   Alzheimer's dementia without behavioral disturbance   Essential hypertension   BPH (benign prostatic hyperplasia)   Vitamin D deficiency   Urinary incontinence   CKD (chronic kidney disease) stage 3, GFR 30-59 ml/min (HCC)   Acute metabolic encephalopathy -Suspect due to mild/moderate dehydration and worsening renal function.  This on top of baseline dementia -This has significantly improved, and per son at bedside he is back to his baseline. -Vitamin B12 is low normal at 242, check MMA (pending today) to fully rule out B12 deficiency.  Vitamin D is within acceptable range at 45.  TSH is within normal limits at 0.562.  Fall at home -X-rays and CTs without acute fractures or changes. -PT evaluation is suggesting SNF, patient and son agree.  Social work has been notified. -Patient is currently awaiting insurance authorization to proceed with short-term rehab at skilled nursing.  Mild rhabdomyolysis -Due to fall with prolonged immobilization. -Continue IV fluids for next 24 hours, CPK is slightly improved today.  See no need to further monitor CPK levels.  Acute on chronic kidney disease stage III-IV -Baseline  creatinine appears to be between 1.7 and 1.9. -Creatinine on admission was 2.09 and on 9/6 has dropped to his baseline of 1.8.  Further down to 1.55 on 1/8. -Would benefit from outpatient nephrology referral on discharge.  Alzheimer's dementia -Without behavioral disturbances, continue outpatient follow-up.   DVT prophylaxis: Lovenox Code Status: Full code Family Communication: Son at bedside updated on plan of care and all questions answered Disposition Plan: SNF once insurance authorization is received  Consultants:   None  Procedures:   None  Antimicrobials:  Anti-infectives (From admission, onward)   None       Subjective: No complaints, mild nonproductive cough, working with PT at present.  Objective: Vitals:   08/06/18 2207 08/06/18 2227 08/07/18 0530 08/07/18 0706  BP: (!) 172/95   (!) 147/91  Pulse: (!) 45 70  (!) 107  Resp: 16   16  Temp: 98.6 F (37 C)   98.6 F (37 C)  TempSrc: Oral   Oral  SpO2: (!) 69% 99%  100%  Weight:   116.1 kg   Height:        Intake/Output Summary (Last 24 hours) at 08/07/2018 1132 Last data filed at 08/07/2018 0930 Gross per 24 hour  Intake 720 ml  Output 2700 ml  Net -1980 ml   Filed Weights   08/03/18 0930 08/07/18 0530  Weight: 115.2 kg 116.1 kg    Examination:  General exam: Alert, awake, oriented x 3 Respiratory system: Clear to auscultation. Respiratory effort normal. Cardiovascular system:RRR. No murmurs, rubs, gallops. Gastrointestinal system: Abdomen is nondistended, soft and nontender.  No organomegaly or masses felt. Normal bowel sounds heard. Central nervous system: Alert and oriented. No focal neurological deficits. Extremities: No C/C/E, +pedal pulses Skin: No rashes, lesions or ulcers Psychiatry: Judgement and insight appear normal. Mood & affect appropriate.       Data Reviewed: I have personally reviewed following labs and imaging studies  CBC: Recent Labs  Lab 08/01/18 1419 08/03/18 0934  08/04/18 0439 08/06/18 0604  WBC 5.0 7.7 9.7 5.6  NEUTROABS  --  6.0  --   --   HGB 11.9* 12.5* 11.5* 11.2*  HCT 36.4* 38.1* 35.2* 34.4*  MCV 92.6 93.8 93.6 93.7  PLT 175 173 161 081   Basic Metabolic Panel: Recent Labs  Lab 08/01/18 1419 08/03/18 0934 08/04/18 0439 08/06/18 0604  NA 137 139 138 140  K 4.7 4.4 4.1 4.5  CL 104 105 108 106  CO2 27 26 22 26   GLUCOSE 91 173* 164* 116*  BUN 20 23 23 20   CREATININE 1.98* 2.09* 1.80* 1.55*  CALCIUM 8.6 9.1 8.2* 8.1*   GFR: Estimated Creatinine Clearance: 44.5 mL/min (A) (by C-G formula based on SCr of 1.55 mg/dL (H)). Liver Function Tests: Recent Labs  Lab 08/01/18 1419 08/03/18 0934  AST 13 34  ALT 9 18  ALKPHOS  --  63  BILITOT 0.7 1.4*  PROT 6.9 8.0  ALBUMIN  --  3.7   No results for input(s): LIPASE, AMYLASE in the last 168 hours. No results for input(s): AMMONIA in the last 168 hours. Coagulation Profile: No results for input(s): INR, PROTIME in the last 168 hours. Cardiac Enzymes: Recent Labs  Lab 08/03/18 0934 08/04/18 0439  CKTOTAL 978* 924*   BNP (last 3 results) No results for input(s): PROBNP in the last 8760 hours. HbA1C: No results for input(s): HGBA1C in the last 72 hours. CBG: Recent Labs  Lab 08/06/18 0749 08/06/18 1201 08/06/18 1614 08/06/18 2203 08/07/18 0737  GLUCAP 109* 109* 115* 163* 81   Lipid Profile: No results for input(s): CHOL, HDL, LDLCALC, TRIG, CHOLHDL, LDLDIRECT in the last 72 hours. Thyroid Function Tests: No results for input(s): TSH, T4TOTAL, FREET4, T3FREE, THYROIDAB in the last 72 hours. Anemia Panel: No results for input(s): VITAMINB12, FOLATE, FERRITIN, TIBC, IRON, RETICCTPCT in the last 72 hours. Urine analysis:    Component Value Date/Time   COLORURINE YELLOW 08/03/2018 [place] 08/03/2018 0934   LABSPEC 1.019 08/03/2018 0934   PHURINE 5.0 08/03/2018 0934   GLUCOSEU NEGATIVE 08/03/2018 0934   HGBUR MODERATE (A) 08/03/2018 0934    BILIRUBINUR NEGATIVE 08/03/2018 0934   BILIRUBINUR neg 12/12/2017 1516   KETONESUR NEGATIVE 08/03/2018 0934   PROTEINUR 30 (A) 08/03/2018 0934   UROBILINOGEN 1.0 12/12/2017 1516   UROBILINOGEN 0.2 06/02/2015 1310   NITRITE NEGATIVE 08/03/2018 0934   LEUKOCYTESUR NEGATIVE 08/03/2018 0934   Sepsis Labs: @LABRCNTIP (procalcitonin:4,lacticidven:4)  ) Recent Results (from the past 240 hour(s))  Urine Culture     Status: None   Collection Time: 08/02/18  9:37 AM  Result Value Ref Range Status   MICRO NUMBER: [id number]  Final   SPECIMEN QUALITY: ADEQUATE  Final   Sample Source URINE  Final   STATUS: FINAL  Final   Result:   Final    Multiple organisms present, each less than 10,000 CFU/mL. These organisms, commonly found on external and internal genitalia, are considered to be colonizers. No further testing performed.         Radiology Studies: No results found.  Scheduled Meds: . donepezil  10 mg Oral QHS  . enoxaparin (LOVENOX) injection  40 mg Subcutaneous Q24H  . ferrous sulfate  325 mg Oral BID  . furosemide  20 mg Oral Daily  . insulin aspart  0-15 Units Subcutaneous TID WC  . insulin aspart  0-5 Units Subcutaneous QHS  . insulin aspart  4 Units Subcutaneous TID WC  . insulin glargine  20 Units Subcutaneous QHS  . loratadine  10 mg Oral q morning - 10a  . oxybutynin  5 mg Oral q morning - 10a  . pantoprazole  40 mg Oral Daily  . pravastatin  40 mg Oral q1800  . sertraline  50 mg Oral Daily  . tamsulosin  0.4 mg Oral Daily   Continuous Infusions: . sodium chloride 75 mL/hr at 08/07/18 0128     LOS: 0 days    Time spent: 25 minutes.    Lelon Frohlich, MD [facility] Pager [phone]  If 7PM-7AM, please contact night-coverage [email or web address] Password [email or web address] 08/07/2018, 11:32 AM

## 2018-08-08 ENCOUNTER — Inpatient Hospital Stay
Admission: RE | Admit: 2018-08-08 | Discharge: 2018-08-09 | Disposition: A | Discharge status: Other Acute Inpatient Hospital | Payer: Medicare HMO | Source: Ambulatory Visit | Attending: Internal Medicine | Admitting: Internal Medicine

## 2018-08-08 DIAGNOSIS — N400 Enlarged prostate without lower urinary tract symptoms: Secondary | ICD-10-CM | POA: Diagnosis not present

## 2018-08-08 DIAGNOSIS — F0390 Unspecified dementia without behavioral disturbance: Secondary | ICD-10-CM | POA: Diagnosis not present

## 2018-08-08 DIAGNOSIS — R7989 Other specified abnormal findings of blood chemistry: Secondary | ICD-10-CM | POA: Diagnosis not present

## 2018-08-08 DIAGNOSIS — W19XXXS Unspecified fall, sequela: Secondary | ICD-10-CM | POA: Diagnosis not present

## 2018-08-08 DIAGNOSIS — R000 Tachycardia, unspecified: Secondary | ICD-10-CM | POA: Diagnosis not present

## 2018-08-08 DIAGNOSIS — N401 Enlarged prostate with lower urinary tract symptoms: Secondary | ICD-10-CM | POA: Diagnosis not present

## 2018-08-08 DIAGNOSIS — W19XXXA Unspecified fall, initial encounter: Secondary | ICD-10-CM | POA: Diagnosis not present

## 2018-08-08 DIAGNOSIS — F0280 Dementia in other diseases classified elsewhere without behavioral disturbance: Secondary | ICD-10-CM | POA: Diagnosis not present

## 2018-08-08 DIAGNOSIS — Z96651 Presence of right artificial knee joint: Secondary | ICD-10-CM | POA: Diagnosis not present

## 2018-08-08 DIAGNOSIS — N183 Chronic kidney disease, stage 3 (moderate): Secondary | ICD-10-CM | POA: Diagnosis not present

## 2018-08-08 DIAGNOSIS — D649 Anemia, unspecified: Secondary | ICD-10-CM | POA: Diagnosis not present

## 2018-08-08 DIAGNOSIS — R531 Weakness: Secondary | ICD-10-CM | POA: Diagnosis not present

## 2018-08-08 DIAGNOSIS — I712 Thoracic aortic aneurysm, without rupture: Secondary | ICD-10-CM | POA: Diagnosis not present

## 2018-08-08 DIAGNOSIS — R262 Difficulty in walking, not elsewhere classified: Secondary | ICD-10-CM | POA: Diagnosis not present

## 2018-08-08 DIAGNOSIS — Z79899 Other long term (current) drug therapy: Secondary | ICD-10-CM | POA: Diagnosis not present

## 2018-08-08 DIAGNOSIS — R4182 Altered mental status, unspecified: Secondary | ICD-10-CM | POA: Diagnosis not present

## 2018-08-08 DIAGNOSIS — R197 Diarrhea, unspecified: Secondary | ICD-10-CM | POA: Diagnosis not present

## 2018-08-08 DIAGNOSIS — R0602 Shortness of breath: Secondary | ICD-10-CM | POA: Diagnosis not present

## 2018-08-08 DIAGNOSIS — E119 Type 2 diabetes mellitus without complications: Secondary | ICD-10-CM | POA: Diagnosis not present

## 2018-08-08 DIAGNOSIS — R05 Cough: Secondary | ICD-10-CM | POA: Diagnosis not present

## 2018-08-08 DIAGNOSIS — M6281 Muscle weakness (generalized): Secondary | ICD-10-CM | POA: Diagnosis not present

## 2018-08-08 DIAGNOSIS — M6282 Rhabdomyolysis: Secondary | ICD-10-CM | POA: Diagnosis not present

## 2018-08-08 DIAGNOSIS — I129 Hypertensive chronic kidney disease with stage 1 through stage 4 chronic kidney disease, or unspecified chronic kidney disease: Secondary | ICD-10-CM | POA: Diagnosis not present

## 2018-08-08 DIAGNOSIS — G309 Alzheimer's disease, unspecified: Secondary | ICD-10-CM | POA: Diagnosis not present

## 2018-08-08 DIAGNOSIS — Z794 Long term (current) use of insulin: Secondary | ICD-10-CM | POA: Diagnosis not present

## 2018-08-08 DIAGNOSIS — I10 Essential (primary) hypertension: Secondary | ICD-10-CM | POA: Diagnosis not present

## 2018-08-08 DIAGNOSIS — D509 Iron deficiency anemia, unspecified: Secondary | ICD-10-CM | POA: Diagnosis not present

## 2018-08-08 DIAGNOSIS — R338 Other retention of urine: Secondary | ICD-10-CM | POA: Diagnosis not present

## 2018-08-08 DIAGNOSIS — G308 Other Alzheimer's disease: Secondary | ICD-10-CM | POA: Diagnosis not present

## 2018-08-08 DIAGNOSIS — E1122 Type 2 diabetes mellitus with diabetic chronic kidney disease: Secondary | ICD-10-CM | POA: Diagnosis not present

## 2018-08-08 DIAGNOSIS — E559 Vitamin D deficiency, unspecified: Secondary | ICD-10-CM | POA: Diagnosis not present

## 2018-08-08 DIAGNOSIS — F419 Anxiety disorder, unspecified: Secondary | ICD-10-CM | POA: Diagnosis not present

## 2018-08-08 DIAGNOSIS — R791 Abnormal coagulation profile: Secondary | ICD-10-CM | POA: Diagnosis present

## 2018-08-08 DIAGNOSIS — E785 Hyperlipidemia, unspecified: Secondary | ICD-10-CM | POA: Diagnosis not present

## 2018-08-08 DIAGNOSIS — N39498 Other specified urinary incontinence: Secondary | ICD-10-CM | POA: Diagnosis not present

## 2018-08-08 DIAGNOSIS — R0902 Hypoxemia: Secondary | ICD-10-CM | POA: Diagnosis not present

## 2018-08-08 LAB — GLUCOSE, CAPILLARY
GLUCOSE-CAPILLARY: 84 mg/dL (ref 70–99)
GLUCOSE-CAPILLARY: 121 mg/dL — AB (ref 70–99)
GLUCOSE-CAPILLARY: 107 mg/dL — AB (ref 70–99)

## 2018-08-08 NOTE — Progress Notes (Signed)
PROGRESS NOTE    Samuel Ryan  MWU:[id number] DOB: 1931-04-13 DOA: 08/03/2018 PCP: [name], MD     Brief Narrative:  84 year old man admitted from home on 9/5 due to a fall.  He has a history of mild dementia and is fairly independent at home with a walker, also history of BPH, insulin-dependent diabetes and hyperlipidemia.  When his son came back from work at around 7:30 AM he found his father on the floor, was a little more confused than normal.  Was found to have acute renal failure and mild rhabdomyolysis and admission was requested for further evaluation and management.   Assessment & Plan:   Principal Problem:   Rhabdomyolysis Active Problems:   Diabetes mellitus, insulin dependent (IDDM), controlled (HCC)   Hyperlipidemia with target LDL less than 100   Alzheimer's dementia without behavioral disturbance   Essential hypertension   BPH (benign prostatic hyperplasia)   Vitamin D deficiency   Urinary incontinence   CKD (chronic kidney disease) stage 3, GFR 30-59 ml/min (HCC)   Acute metabolic encephalopathy -Suspect due to mild/moderate dehydration and worsening renal function.  This on top of baseline dementia -This has significantly improved, and per son at bedside he is back to his baseline. -Vitamin B12 is normal at 242. Vitamin D is within acceptable range at 45.  TSH is within normal limits at 0.562.  Fall at home -X-rays and CTs without acute fractures or changes. -PT evaluation is suggesting SNF, patient and son agree.  Social work has been notified. -Patient is currently awaiting insurance authorization to proceed with short-term rehab at skilled nursing.  Mild rhabdomyolysis -Due to fall with prolonged immobilization. -IV fluids have been discontinued. -See no need to further monitor CPK levels.  Acute on chronic kidney disease stage III-IV -Baseline creatinine appears to be between 1.7 and 1.9. -Creatinine on admission was 2.09 and on 9/6 has  dropped to his baseline of 1.8.  Further down to 1.55 on 1/8. -Would benefit from outpatient nephrology referral on discharge.  Alzheimer's dementia -Without behavioral disturbances, continue outpatient follow-up.   DVT prophylaxis: Lovenox Code Status: Full code Family Communication: Son at bedside updated on plan of care and all questions answered Disposition Plan: SNF once insurance authorization is received  Consultants:   None  Procedures:   None  Antimicrobials:  Anti-infectives (From admission, onward)   None       Subjective: Sitting in chair at bedside, no complaints.  Objective: Vitals:   08/07/18 2141 08/08/18 0551 08/08/18 1021 08/08/18 1353  BP: (!) 143/65 (!) 158/85  139/66  Pulse: 67 65  64  Resp: 18 18  20   Temp: 98.3 F (36.8 C) 98.2 F (36.8 C)  (!) 97.5 F (36.4 C)  TempSrc: Oral Oral  Oral  SpO2: 94% 100% 95% 98%  Weight:  115.4 kg    Height:        Intake/Output Summary (Last 24 hours) at 08/08/2018 1441 Last data filed at 08/08/2018 0900 Gross per 24 hour  Intake 720 ml  Output 1050 ml  Net -330 ml   Filed Weights   08/03/18 0930 08/07/18 0530 08/08/18 0551  Weight: 115.2 kg 116.1 kg 115.4 kg    Examination:  General exam: Alert, awake, oriented x 3 Respiratory system: Clear to auscultation. Respiratory effort normal. Cardiovascular system:RRR. No murmurs, rubs, gallops. Gastrointestinal system: Abdomen is nondistended, soft and nontender. No organomegaly or masses felt. Normal bowel sounds heard. Central nervous system: Alert and oriented. No focal neurological  deficits. Extremities: No C/C/E, +pedal pulses Skin: No rashes, lesions or ulcers Psychiatry: Judgement and insight appear normal. Mood & affect appropriate.        Data Reviewed: I have personally reviewed following labs and imaging studies  CBC: Recent Labs  Lab 08/03/18 0934 08/04/18 0439 08/06/18 0604  WBC 7.7 9.7 5.6  NEUTROABS 6.0  --   --   HGB  12.5* 11.5* 11.2*  HCT 38.1* 35.2* 34.4*  MCV 93.8 93.6 93.7  PLT 173 161 099   Basic Metabolic Panel: Recent Labs  Lab 08/03/18 0934 08/04/18 0439 08/06/18 0604  NA 139 138 140  K 4.4 4.1 4.5  CL 105 108 106  CO2 26 22 26   GLUCOSE 173* 164* 116*  BUN 23 23 20   CREATININE 2.09* 1.80* 1.55*  CALCIUM 9.1 8.2* 8.1*   GFR: Estimated Creatinine Clearance: 44.4 mL/min (A) (by C-G formula based on SCr of 1.55 mg/dL (H)). Liver Function Tests: Recent Labs  Lab 08/03/18 0934  AST 34  ALT 18  ALKPHOS 63  BILITOT 1.4*  PROT 8.0  ALBUMIN 3.7   No results for input(s): LIPASE, AMYLASE in the last 168 hours. No results for input(s): AMMONIA in the last 168 hours. Coagulation Profile: No results for input(s): INR, PROTIME in the last 168 hours. Cardiac Enzymes: Recent Labs  Lab 08/03/18 0934 08/04/18 0439  CKTOTAL 978* 924*   BNP (last 3 results) No results for input(s): PROBNP in the last 8760 hours. HbA1C: No results for input(s): HGBA1C in the last 72 hours. CBG: Recent Labs  Lab 08/07/18 1135 08/07/18 1610 08/07/18 2142 08/08/18 0814 08/08/18 1152  GLUCAP 101* 102* 100* 84 121*   Lipid Profile: No results for input(s): CHOL, HDL, LDLCALC, TRIG, CHOLHDL, LDLDIRECT in the last 72 hours. Thyroid Function Tests: No results for input(s): TSH, T4TOTAL, FREET4, T3FREE, THYROIDAB in the last 72 hours. Anemia Panel: No results for input(s): VITAMINB12, FOLATE, FERRITIN, TIBC, IRON, RETICCTPCT in the last 72 hours. Urine analysis:    Component Value Date/Time   COLORURINE YELLOW 08/03/2018 [place] 08/03/2018 0934   LABSPEC 1.019 08/03/2018 0934   PHURINE 5.0 08/03/2018 0934   GLUCOSEU NEGATIVE 08/03/2018 0934   HGBUR MODERATE (A) 08/03/2018 0934   BILIRUBINUR NEGATIVE 08/03/2018 0934   BILIRUBINUR neg 12/12/2017 1516   KETONESUR NEGATIVE 08/03/2018 0934   PROTEINUR 30 (A) 08/03/2018 0934   UROBILINOGEN 1.0 12/12/2017 1516   UROBILINOGEN 0.2  06/02/2015 1310   NITRITE NEGATIVE 08/03/2018 0934   LEUKOCYTESUR NEGATIVE 08/03/2018 0934   Sepsis Labs: @LABRCNTIP (procalcitonin:4,lacticidven:4)  ) Recent Results (from the past 240 hour(s))  Urine Culture     Status: None   Collection Time: 08/02/18  9:37 AM  Result Value Ref Range Status   MICRO NUMBER: [id number]  Final   SPECIMEN QUALITY: ADEQUATE  Final   Sample Source URINE  Final   STATUS: FINAL  Final   Result:   Final    Multiple organisms present, each less than 10,000 CFU/mL. These organisms, commonly found on external and internal genitalia, are considered to be colonizers. No further testing performed.         Radiology Studies: No results found.      Scheduled Meds: . donepezil  10 mg Oral QHS  . enoxaparin (LOVENOX) injection  40 mg Subcutaneous Q24H  . ferrous sulfate  325 mg Oral BID  . furosemide  20 mg Oral Daily  . insulin aspart  0-15 Units Subcutaneous TID WC  .  insulin aspart  0-5 Units Subcutaneous QHS  . insulin aspart  4 Units Subcutaneous TID WC  . insulin glargine  20 Units Subcutaneous QHS  . loratadine  10 mg Oral q morning - 10a  . oxybutynin  5 mg Oral q morning - 10a  . pantoprazole  40 mg Oral Daily  . pravastatin  40 mg Oral q1800  . sertraline  50 mg Oral Daily  . tamsulosin  0.4 mg Oral Daily   Continuous Infusions:    LOS: 0 days    Time spent: 25 minutes.    Lelon Frohlich, MD [facility] Pager [phone]  If 7PM-7AM, please contact night-coverage [email or web address] Password [email or web address] 08/08/2018, 2:41 PM

## 2018-08-08 NOTE — Clinical Social Work Placement (Signed)
   CLINICAL SOCIAL WORK PLACEMENT  NOTE  Date:  08/08/2018  Patient Details  Name: Samuel Ryan MRN: [id number] Date of Birth: 1931-04-25  Clinical Social Work is seeking post-discharge placement for this patient at the [place] level of care (*CSW will initial, date and re-position this form in  chart as items are completed):  Yes   Patient/family provided with [place] Work Department's list of facilities offering this level of care within the geographic area requested by the patient (or if unable, by the patient's family).  Yes   Patient/family informed of their freedom to choose among providers that offer the needed level of care, that participate in Medicare, Medicaid or managed care program needed by the patient, have an available bed and are willing to accept the patient.  Yes   Patient/family informed of [facility] [place] [facility]'s ownership interest in [facility] and [facility], as well as of the fact that they are under no obligation to receive care at these facilities.  PASRR submitted to EDS on       PASRR number received on       Existing PASRR number confirmed on 08/04/18     FL2 transmitted to all facilities in geographic area requested by pt/family on 08/04/18     FL2 transmitted to all facilities within larger geographic area on       Patient informed that his/her managed care company has contracts with or will negotiate with certain facilities, including the following:        Yes   Patient/family informed of bed offers received.  Patient chooses bed at [facility]     Physician recommends and patient chooses bed at      Patient to be transferred to [facility] on 08/08/18.  Patient to be transferred to facility by [facility] staff     Patient family notified on 08/08/18 of transfer.  Name of family member notified:  Hance Caspers, son     PHYSICIAN       Additional Comment:  Discharge clinicals sent to  facility. LCSW signing off.   _______________________________________________ Ihor Gully, LCSW 08/08/2018, 3:53 PM

## 2018-08-08 NOTE — Progress Notes (Signed)
Physical Therapy Treatment Patient Details Name: Samuel Ryan MRN: [id number] DOB: [birth year] Today's Date: 08/08/2018    History of Present Illness 84 yo male with cognitive decline was admitted for fall with rhabdomyolysis, atelectasis, has been referred PT for mobility.  PMHx:  cervical fusion ACDF C4-6, cardiomegaly, DVT, dementia, PNA, PE, DM, dementia, OA with R TKA, a-fib,     PT Comments    Pt was able to walk farther today with better control of O2 sats, using a second person to push a chair behind him and to monitor his O2 sats during effort.  Used 3L O2 today for walk and had better results with more control of vitals.  He is scheduled to go to SNF this afternoon, and did take time to instruct hosp nursing to return him to standing and bed with cues for pt to participate in therapy better.   Follow Up Recommendations  SNF     Equipment Recommendations  None recommended by PT    Recommendations for Other Services       Precautions / Restrictions Precautions Precautions: Fall(ck O2 sats and telemetry) Restrictions Weight Bearing Restrictions: No Other Position/Activity Restrictions: monitor R knee for pain    Mobility  Bed Mobility Overal bed mobility: Needs Assistance Bed Mobility: Supine to Sit;Sit to Supine     Supine to sit: Mod assist     General bed mobility comments: supported trunk and lifted legs backto bed  Transfers Overall transfer level: Needs assistance Equipment used: Rolling walker (2 wheeled);1 person hand held assist Transfers: Sit to/from Stand Sit to Stand: Mod assist         General transfer comment: prompts to use handrails, counting to three  Ambulation/Gait Ambulation/Gait assistance: Min assist Gait Distance (Feet): 70 Feet(30+40) Assistive device: Rolling walker (2 wheeled);1 person hand held assist Gait Pattern/deviations: Step-through pattern;Decreased stride length;Wide base of support;Trunk flexed;Shuffle Gait velocity:  reduced Gait velocity interpretation: <1.8 ft/sec, indicate of risk for recurrent falls General Gait Details: pt is struggling withturning walker so assisted with these efforts   Stairs             Wheelchair Mobility    Modified Rankin (Stroke Patients Only)       Balance Overall balance assessment: Needs assistance Sitting-balance support: Feet supported Sitting balance-Leahy Scale: Good     Standing balance support: Bilateral upper extremity supported;During functional activity Standing balance-Leahy Scale: Poor Standing balance comment: fair with RW standing at times                            Cognition Arousal/Alertness: Awake/alert Behavior During Therapy: WFL for tasks assessed/performed Overall Cognitive Status: History of cognitive impairments - at baseline                                 General Comments: son is present to monitor his father's needs      Exercises General Exercises - Lower Extremity Ankle Circles/Pumps: AROM;AAROM;Both;5 reps Long Arc Quad: Strengthening;Both;10 reps Heel Slides: Strengthening;Both;10 reps Hip ABduction/ADduction: AROM;AAROM;Both;10 reps    General Comments General comments (skin integrity, edema, etc.): O2 sats were controlled but increased to 3L during gait and maintained up to 96% with effort      Pertinent Vitals/Pain Pain Assessment: No/denies pain    Home Living  Prior Function            PT Goals (current goals can now be found in the care plan section) Progress towards PT goals: Progressing toward goals    Frequency    Min 3X/week      PT Plan Current plan remains appropriate    Co-evaluation              AM-PAC PT "6 Clicks" Daily Activity  Outcome Measure  Difficulty turning over in bed (including adjusting bedclothes, sheets and blankets)?: A Little Difficulty moving from lying on back to sitting on the side of the bed? :  Unable Difficulty sitting down on and standing up from a chair with arms (e.g., wheelchair, bedside commode, etc,.)?: Unable Help needed moving to and from a bed to chair (including a wheelchair)?: A Little Help needed walking in hospital room?: A Little Help needed climbing 3-5 steps with a railing? : A Lot 6 Click Score: 13    End of Session Equipment Utilized During Treatment: Gait belt;Oxygen Activity Tolerance: Patient limited by fatigue;Treatment limited secondary to medical complications (Comment) Patient left: in chair;with call bell/phone within reach;with chair alarm set;with family/visitor present Nurse Communication: Mobility status PT Visit Diagnosis: Unsteadiness on feet (R26.81);Muscle weakness (generalized) (M62.81);History of falling (Z91.81);Difficulty in walking, not elsewhere classified (R26.2)     Time: 1032-1102(+1304 to 1312) PT Time Calculation (min) (ACUTE ONLY): 30 min  Charges:  $Gait Training: 8-22 mins $Therapeutic Exercise: 8-22 mins $Therapeutic Activity: 8-22 mins                 Ramond Dial 08/08/2018, 4:50 PM   4:52 PM, 08/08/18 Mee Hives, PT, MS Physical Therapist - [place] [phone] (Office)

## 2018-08-08 NOTE — Progress Notes (Signed)
Report called to the [facility]. 

## 2018-08-08 NOTE — Discharge Summary (Signed)
Physician Discharge Summary  MUADH CREASY OIN:[id number] DOB: 07-07-1931 DOA: 08/03/2018  PCP: [name], MD  Admit date: 08/03/2018 Discharge date: 08/08/2018  Time spent: 45 minutes  Recommendations for Outpatient Follow-up:  -Will be discharged home today. -Advised to follow up with PCP in 2 weeks. -To SNF for ST-rehab.   Discharge Diagnoses:  Principal Problem:   Rhabdomyolysis Active Problems:   Diabetes mellitus, insulin dependent (IDDM), controlled (HCC)   Hyperlipidemia with target LDL less than 100   Alzheimer's dementia without behavioral disturbance   Essential hypertension   BPH (benign prostatic hyperplasia)   Vitamin D deficiency   Urinary incontinence   CKD (chronic kidney disease) stage 3, GFR 30-59 ml/min ([place])   Discharge Condition: Stable and improved  Filed Weights   08/03/18 0930 08/07/18 0530 08/08/18 0551  Weight: 115.2 kg 116.1 kg 115.4 kg    History of present illness:  Samuel Ryan is a 84 y.o. male with history of mild dementia who was fairly independent at home with a walker, does have a caregiver, BPH, insulin-dependent diabetes, hyperlipidemia among other things.  Son works nights and when he returned from work at 8 he found his father on the floor.  He was little more confused than normal.  He called 911 and was brought to the hospital for evaluation.  Patient is currently alert awake and oriented.  He tells me that he was watching the Tempie Donning show went to stand up and fell, he states he was probably on the floor for around 2 to 3 hours and was unable to stand up on his own.  He denies loss of consciousness, chest pain, shortness of breath or any other symptoms.  Upon arrival in the emergency department vital signs are noted to be stable, may be slightly hypertensive, labs are significant for a hemoglobin of 12.5, a creatinine of 2.09 with a baseline between 1.7 and 1.9.  CPK level is elevated at 978.  Admission is requested for  further evaluation and management.  Hospital Course:   Acute metabolic encephalopathy -Suspect due to mild/moderate dehydration and worsening renal function.  This on top of baseline dementia -This has significantly improved, and per son at bedside he is back to his baseline. -Vitamin B12 is normal at 242. Vitamin D is within acceptable range at 45.  TSH is within normal limits at 0.562.  Fall at home -X-rays and CTs without acute fractures or changes. -PT evaluation is suggesting SNF, patient and son agree.   -To SNF today.  Mild rhabdomyolysis -Due to fall with prolonged immobilization. -IV fluids have been discontinued. -See no need to further monitor CPK levels.  Acute on chronic kidney disease stage III-IV -Baseline creatinine appears to be between 1.7 and 1.9. -Creatinine on admission was 2.09 and on 9/6 has dropped to his baseline of 1.8.  Further down to 1.55 on 1/8. -Would benefit from outpatient nephrology referral on discharge.  Alzheimer's dementia -Without behavioral disturbances, continue outpatient follow-up.   Procedures:  None   Consultations:  None  Discharge Instructions  Discharge Instructions    Diet - low sodium heart healthy   Complete by:  As directed    Increase activity slowly   Complete by:  As directed      Allergies as of 08/08/2018      Reactions   Zosyn [piperacillin Sod-tazobactam So] Anaphylaxis, Swelling, Other (See Comments)   Facial swelling Has patient had a PCN reaction causing immediate rash, facial/tongue/throat swelling, SOB  or lightheadedness with hypotension: Yes Has patient had a PCN reaction causing severe rash involving mucus membranes or skin necrosis: No Has patient had a PCN reaction that required hospitalization: No Has patient had a PCN reaction occurring within the last 10 years: Yes If all of the above answers are "NO", then may proceed with Cephalosporin use.   Ace Inhibitors Cough   Vancomycin Swelling,  Other (See Comments)   Facial swelling      Medication List    STOP taking these medications   traMADol 50 MG tablet Commonly known as:  ULTRAM     TAKE these medications   acetaminophen 500 MG tablet Commonly known as:  TYLENOL Take 1,000 mg by mouth every 6 (six) hours as needed for mild pain, moderate pain or headache.   carboxymethylcellul-glycerin 0.5-0.9 % ophthalmic solution Commonly known as:  REFRESH OPTIVE Place 1 drop into both eyes 4 (four) times daily as needed (for dry eyes).   clobetasol cream 0.05 % Commonly known as:  TEMOVATE APPLY 1 APPLICATION TOPICALLY 2 (TWO) TIMES DAILY.   donepezil 10 MG tablet Commonly known as:  ARICEPT TAKE 1 TABLET AT BEDTIME   FEROSUL 325 (65 FE) MG tablet Generic drug:  ferrous sulfate TAKE 1 TABLET TWICE DAILY What changed:    how much to take  how to take this  when to take this   furosemide 20 MG tablet Commonly known as:  LASIX Take 1 tablet (20 mg total) by mouth daily.   glipiZIDE 10 MG 24 hr tablet Commonly known as:  GLUCOTROL XL Take 1 tablet (10 mg total) by mouth daily with breakfast. What changed:  how much to take   glucose blood test strip TEST ONCE DAILY dx e11.9   LANTUS SOLOSTAR 100 UNIT/ML Solostar Pen Generic drug:  Insulin Glargine Inject 20 Units into the skin daily as needed (if blood sugar is over 100). If blood sugar is over 100   loratadine 10 MG tablet Commonly known as:  CLARITIN Take 10 mg by mouth every morning.   lovastatin 40 MG tablet Commonly known as:  MEVACOR TAKE 1 TABLET AT BEDTIME   omeprazole 40 MG capsule Commonly known as:  PRILOSEC TAKE 1 CAPSULE EVERY DAY   Pen Needles 31G X 5 MM Misc 1 each by Does not apply route as directed. Use to inject lantus daily dx E11.65   sertraline 50 MG tablet Commonly known as:  ZOLOFT TAKE 1 TABLET EVERY DAY   tamsulosin 0.4 MG Caps capsule Commonly known as:  FLOMAX TAKE 1 CAPSULE EVERY DAY   Trospium Chloride 60 MG  Cp24 Take 60 mg by mouth daily.   Vitamin D (Ergocalciferol) 50000 units Caps capsule Commonly known as:  DRISDOL TAKE 1 CAPSULE EVERY 7 (SEVEN) DAYS. What changed:  See the new instructions.      Allergies  Allergen Reactions  . Zosyn [Piperacillin Sod-Tazobactam So] Anaphylaxis, Swelling and Other (See Comments)    Facial swelling Has patient had a PCN reaction causing immediate rash, facial/tongue/throat swelling, SOB or lightheadedness with hypotension: Yes Has patient had a PCN reaction causing severe rash involving mucus membranes or skin necrosis: No Has patient had a PCN reaction that required hospitalization: No Has patient had a PCN reaction occurring within the last 10 years: Yes If all of the above answers are "NO", then may proceed with Cephalosporin use.   . Ace Inhibitors Cough  . Vancomycin Swelling and Other (See Comments)    Facial swelling  Contact information for after-discharge care    [facility] .   Service:  Skilled Nursing Contact information: [address] [place] [phone]               The results of significant diagnostics from this hospitalization (including imaging, microbiology, ancillary and laboratory) are listed below for reference.    Significant Diagnostic Studies: Dg Chest 2 View  Result Date: 08/03/2018 CLINICAL DATA:  Acute pain following fall last night. EXAM: CHEST - 2 VIEW COMPARISON:  08/01/2018 and prior exams FINDINGS: Cardiomegaly again noted. Mild bibasilar atelectasis/scarring again identified. There is no evidence of airspace disease, pleural effusion or pneumothorax. No acute bony abnormalities are identified. IMPRESSION: Cardiomegaly without evidence of acute cardiopulmonary disease. Mild bibasilar atelectasis/scarring. Electronically Signed   By: Margarette Canada M.D.   On: 08/03/2018 10:50   Dg Chest 2 View  Result Date: 08/02/2018 CLINICAL DATA:   Cough and congestion EXAM: CHEST - 2 VIEW COMPARISON:  December 12, 2017 and September 14, 2017 FINDINGS: There is scarring and fibrosis in the mid and lower lung zones, most severe in the bases, stable. There is no frank edema or consolidation. Heart is borderline enlarged, stable, with pulmonary vascularity normal. No adenopathy. There is degenerative change in the thoracic spine. IMPRESSION: Fibrosis and scarring in the mid and lower lung zones, most severe in the bases, stable. No edema or consolidation. Stable cardiac prominence. No adenopathy evident. Electronically Signed   By: Lowella Grip III M.D.   On: 08/02/2018 08:42   Dg Pelvis 1-2 Views  Result Date: 08/03/2018 CLINICAL DATA:  Acute pelvic pain following fall last night. Initial encounter. EXAM: PELVIS - 1-2 VIEW COMPARISON:  09/18/2017 CT FINDINGS: There is no evidence of pelvic fracture or diastasis. No pelvic bone lesions are seen. IMPRESSION: Negative. Electronically Signed   By: Margarette Canada M.D.   On: 08/03/2018 10:54   Ct Head Wo Contrast  Result Date: 08/03/2018 CLINICAL DATA:  Altered level of consciousness.  Fall today. EXAM: CT HEAD WITHOUT CONTRAST CT CERVICAL SPINE WITHOUT CONTRAST TECHNIQUE: Multidetector CT imaging of the head and cervical spine was performed following the standard protocol without intravenous contrast. Multiplanar CT image reconstructions of the cervical spine were also generated. COMPARISON:  CT head 02/21/2017 FINDINGS: CT HEAD FINDINGS Brain: Moderate atrophy. Moderate chronic microvascular ischemic change. Negative for acute infarct. Negative for acute hemorrhage or mass. No midline shift. Extensive dural ossification anteriorly the appears unchanged. Vascular: Negative for hyperdense vessel Skull: Negative Sinuses/Orbits: Negative Other: None CT CERVICAL SPINE FINDINGS Alignment: Straightening of the cervical lordosis with mild kyphosis at C3-4. Normal alignment. Skull base and vertebrae: Negative for  fracture Soft tissues and spinal canal: 3 cm mass left lobe of the thyroid, incompletely evaluated on this study. Recommend thyroid ultrasound. Atherosclerotic calcification carotid artery. No enlarged lymph nodes in the neck. Disc levels: ACDF with anterior plate and screws C4 through C6. Disc degeneration and spurring at C2-3, C3-4, and C6-7. Disc and facet degeneration at C7-T1 and T1-2. Upper chest: Negative Other: None IMPRESSION: 1. Atrophy and chronic microvascular ischemia. No acute intracranial abnormality 2. ACDF C4 through C6. Negative for fracture. Cervical spondylosis as above 3. 3 cm mass left lobe of thyroid incompletely evaluated on this study. Recommend thyroid ultrasound. Electronically Signed   By: Franchot Gallo M.D.   On: 08/03/2018 11:20   Ct Cervical Spine Wo Contrast  Result Date: 08/03/2018 CLINICAL DATA:  Altered  level of consciousness.  Fall today. EXAM: CT HEAD WITHOUT CONTRAST CT CERVICAL SPINE WITHOUT CONTRAST TECHNIQUE: Multidetector CT imaging of the head and cervical spine was performed following the standard protocol without intravenous contrast. Multiplanar CT image reconstructions of the cervical spine were also generated. COMPARISON:  CT head 02/21/2017 FINDINGS: CT HEAD FINDINGS Brain: Moderate atrophy. Moderate chronic microvascular ischemic change. Negative for acute infarct. Negative for acute hemorrhage or mass. No midline shift. Extensive dural ossification anteriorly the appears unchanged. Vascular: Negative for hyperdense vessel Skull: Negative Sinuses/Orbits: Negative Other: None CT CERVICAL SPINE FINDINGS Alignment: Straightening of the cervical lordosis with mild kyphosis at C3-4. Normal alignment. Skull base and vertebrae: Negative for fracture Soft tissues and spinal canal: 3 cm mass left lobe of the thyroid, incompletely evaluated on this study. Recommend thyroid ultrasound. Atherosclerotic calcification carotid artery. No enlarged lymph nodes in the neck. Disc  levels: ACDF with anterior plate and screws C4 through C6. Disc degeneration and spurring at C2-3, C3-4, and C6-7. Disc and facet degeneration at C7-T1 and T1-2. Upper chest: Negative Other: None IMPRESSION: 1. Atrophy and chronic microvascular ischemia. No acute intracranial abnormality 2. ACDF C4 through C6. Negative for fracture. Cervical spondylosis as above 3. 3 cm mass left lobe of thyroid incompletely evaluated on this study. Recommend thyroid ultrasound. Electronically Signed   By: Franchot Gallo M.D.   On: 08/03/2018 11:20   Dg Knee Complete 4 Views Right  Result Date: 08/03/2018 CLINICAL DATA:  [place]. Rt knee pain EXAM: RIGHT KNEE - COMPLETE 4+ VIEW COMPARISON:  07/27/2010 FINDINGS: Patient has undergone interval knee arthroplasty. The hardware is well-seated. No evidence for acute fracture or subluxation. Trace joint effusion is present. There is popliteal artery calcification. IMPRESSION: 1. Postoperative changes. 2. Trace joint effusion. 3.  No evidence for acute  abnormality. Electronically Signed   By: Nolon Nations M.D.   On: 08/03/2018 13:36    Microbiology: Recent Results (from the past 240 hour(s))  Urine Culture     Status: None   Collection Time: 08/02/18  9:37 AM  Result Value Ref Range Status   MICRO NUMBER: [id number]  Final   SPECIMEN QUALITY: ADEQUATE  Final   Sample Source URINE  Final   STATUS: FINAL  Final   Result:   Final    Multiple organisms present, each less than 10,000 CFU/mL. These organisms, commonly found on external and internal genitalia, are considered to be colonizers. No further testing performed.     Labs: Basic Metabolic Panel: Recent Labs  Lab 08/03/18 0934 08/04/18 0439 08/06/18 0604  NA 139 138 140  K 4.4 4.1 4.5  CL 105 108 106  CO2 26 22 26   GLUCOSE 173* 164* 116*  BUN 23 23 20   CREATININE 2.09* 1.80* 1.55*  CALCIUM 9.1 8.2* 8.1*   Liver Function Tests: Recent Labs  Lab 08/03/18 0934  AST 34  ALT 18  ALKPHOS 63  BILITOT 1.4*   PROT 8.0  ALBUMIN 3.7   No results for input(s): LIPASE, AMYLASE in the last 168 hours. No results for input(s): AMMONIA in the last 168 hours. CBC: Recent Labs  Lab 08/03/18 0934 08/04/18 0439 08/06/18 0604  WBC 7.7 9.7 5.6  NEUTROABS 6.0  --   --   HGB 12.5* 11.5* 11.2*  HCT 38.1* 35.2* 34.4*  MCV 93.8 93.6 93.7  PLT 173 161 169   Cardiac Enzymes: Recent Labs  Lab 08/03/18 0934 08/04/18 0439  CKTOTAL 978* 924*   BNP: BNP (last 3 results) No  results for input(s): BNP in the last 8760 hours.  ProBNP (last 3 results) No results for input(s): PROBNP in the last 8760 hours.  CBG: Recent Labs  Lab 08/07/18 1135 08/07/18 1610 08/07/18 2142 08/08/18 0814 08/08/18 1152  GLUCAP 101* 102* 100* 84 121*       Signed:  [facility] Pager: [phone] 08/08/2018, 3:37 PM

## 2018-08-09 ENCOUNTER — Encounter (HOSPITAL_COMMUNITY): Payer: Self-pay | Admitting: *Deleted

## 2018-08-09 ENCOUNTER — Other Ambulatory Visit: Payer: Self-pay

## 2018-08-09 ENCOUNTER — Emergency Department (HOSPITAL_COMMUNITY)
Admission: EM | Admit: 2018-08-09 | Discharge: 2018-08-09 | Disposition: A | Discharge status: Home / Self Care | Payer: Medicare HMO | Attending: Emergency Medicine | Admitting: Emergency Medicine

## 2018-08-09 ENCOUNTER — Non-Acute Institutional Stay (SKILLED_NURSING_FACILITY): Payer: Medicare HMO | Admitting: Internal Medicine

## 2018-08-09 ENCOUNTER — Other Ambulatory Visit (HOSPITAL_COMMUNITY)
Admission: RE | Admit: 2018-08-09 | Discharge: 2018-08-09 | Disposition: A | Discharge status: Home / Self Care | Payer: Medicare HMO | Source: Skilled Nursing Facility | Attending: *Deleted | Admitting: *Deleted

## 2018-08-09 ENCOUNTER — Emergency Department (HOSPITAL_COMMUNITY): Payer: Medicare HMO

## 2018-08-09 ENCOUNTER — Encounter: Payer: Self-pay | Admitting: Internal Medicine

## 2018-08-09 DIAGNOSIS — N183 Chronic kidney disease, stage 3 unspecified: Secondary | ICD-10-CM

## 2018-08-09 DIAGNOSIS — E1122 Type 2 diabetes mellitus with diabetic chronic kidney disease: Secondary | ICD-10-CM | POA: Diagnosis not present

## 2018-08-09 DIAGNOSIS — I712 Thoracic aortic aneurysm, without rupture, unspecified: Secondary | ICD-10-CM

## 2018-08-09 DIAGNOSIS — R197 Diarrhea, unspecified: Secondary | ICD-10-CM

## 2018-08-09 DIAGNOSIS — E119 Type 2 diabetes mellitus without complications: Secondary | ICD-10-CM | POA: Diagnosis not present

## 2018-08-09 DIAGNOSIS — Z96651 Presence of right artificial knee joint: Secondary | ICD-10-CM | POA: Diagnosis not present

## 2018-08-09 DIAGNOSIS — M6282 Rhabdomyolysis: Secondary | ICD-10-CM

## 2018-08-09 DIAGNOSIS — G309 Alzheimer's disease, unspecified: Secondary | ICD-10-CM | POA: Insufficient documentation

## 2018-08-09 DIAGNOSIS — Z79899 Other long term (current) drug therapy: Secondary | ICD-10-CM | POA: Insufficient documentation

## 2018-08-09 DIAGNOSIS — D649 Anemia, unspecified: Secondary | ICD-10-CM

## 2018-08-09 DIAGNOSIS — R7989 Other specified abnormal findings of blood chemistry: Secondary | ICD-10-CM | POA: Diagnosis not present

## 2018-08-09 DIAGNOSIS — Z794 Long term (current) use of insulin: Secondary | ICD-10-CM | POA: Insufficient documentation

## 2018-08-09 DIAGNOSIS — IMO0001 Reserved for inherently not codable concepts without codable children: Secondary | ICD-10-CM

## 2018-08-09 DIAGNOSIS — I129 Hypertensive chronic kidney disease with stage 1 through stage 4 chronic kidney disease, or unspecified chronic kidney disease: Secondary | ICD-10-CM | POA: Insufficient documentation

## 2018-08-09 DIAGNOSIS — R338 Other retention of urine: Secondary | ICD-10-CM | POA: Diagnosis not present

## 2018-08-09 DIAGNOSIS — N401 Enlarged prostate with lower urinary tract symptoms: Secondary | ICD-10-CM | POA: Diagnosis not present

## 2018-08-09 DIAGNOSIS — R000 Tachycardia, unspecified: Secondary | ICD-10-CM | POA: Diagnosis not present

## 2018-08-09 LAB — CBC WITH DIFFERENTIAL/PLATELET
BASOS PCT: 0 %
Basophils Absolute: 0 10*3/uL (ref 0.0–0.1)
EOS ABS: 0.1 10*3/uL (ref 0.0–0.7)
EOS PCT: 1 %
HCT: 34.6 % — ABNORMAL LOW (ref 39.0–52.0)
Hemoglobin: 11.6 g/dL — ABNORMAL LOW (ref 13.0–17.0)
Lymphocytes Relative: 27 %
Lymphs Abs: 1.4 10*3/uL (ref 0.7–4.0)
MCH: 30.9 pg (ref 26.0–34.0)
MCHC: 33.5 g/dL (ref 30.0–36.0)
MCV: 92.3 fL (ref 78.0–100.0)
Monocytes Absolute: 0.5 10*3/uL (ref 0.1–1.0)
Monocytes Relative: 10 %
Neutro Abs: 3.1 10*3/uL (ref 1.7–7.7)
Neutrophils Relative %: 62 %
PLATELETS: 184 10*3/uL (ref 150–400)
RBC: 3.75 MIL/uL — ABNORMAL LOW (ref 4.22–5.81)
RDW: 13.8 % (ref 11.5–15.5)
WBC: 5 10*3/uL (ref 4.0–10.5)

## 2018-08-09 LAB — COMPREHENSIVE METABOLIC PANEL
ALT: 16 U/L (ref 0–44)
AST: 21 U/L (ref 15–41)
Albumin: 2.9 g/dL — ABNORMAL LOW (ref 3.5–5.0)
Alkaline Phosphatase: 58 U/L (ref 38–126)
Anion gap: 7 (ref 5–15)
BILIRUBIN TOTAL: 0.5 mg/dL (ref 0.3–1.2)
BUN: 22 mg/dL (ref 8–23)
CO2: 24 mmol/L (ref 22–32)
Calcium: 8.1 mg/dL — ABNORMAL LOW (ref 8.9–10.3)
Chloride: 102 mmol/L (ref 98–111)
Creatinine, Ser: 1.73 mg/dL — ABNORMAL HIGH (ref 0.61–1.24)
GFR calc non Af Amer: 34 mL/min — ABNORMAL LOW (ref >60)
GFR, EST AFRICAN AMERICAN: 40 mL/min — AB (ref >60)
GLUCOSE: 182 mg/dL — AB (ref 70–99)
POTASSIUM: 4 mmol/L (ref 3.5–5.1)
SODIUM: 133 mmol/L — AB (ref 135–145)
TOTAL PROTEIN: 6.9 g/dL (ref 6.5–8.1)

## 2018-08-09 LAB — D-DIMER, QUANTITATIVE: D-Dimer, Quant: 2.5 ug/mL-FEU — ABNORMAL HIGH (ref 0.00–0.50)

## 2018-08-09 LAB — METHYLMALONIC ACID(MMA), RND URINE
Creatinine(Crt), U: 1.07 g/L (ref 0.30–3.00)
METHYLMALONIC ACID UR: 6.2 umol/L (ref 1.6–29.7)
MMA - Normalized: 0.7 umol/mmol cr (ref 0.3–2.8)

## 2018-08-09 MED ORDER — IOPAMIDOL (ISOVUE-370) INJECTION 76%
100.0000 mL | Freq: Once | INTRAVENOUS | Status: AC | PRN
  Administered 2018-08-09: 75 mL via INTRAVENOUS

## 2018-08-09 MED ORDER — SODIUM CHLORIDE 0.9 % IV BOLUS
500.0000 mL | Freq: Once | INTRAVENOUS | Status: AC
  Administered 2018-08-09 (×2): 500 mL via INTRAVENOUS

## 2018-08-09 NOTE — Discharge Instructions (Addendum)
CT scan of chest showed no evidence of a blood clot.  However, a 4 cm ascending thoracic aortic aneurysm was noted.  Recommend follow-up angiogram in 1 year per radiologist recommendation.  Otherwise, follow-up with your primary care doctor.

## 2018-08-09 NOTE — Progress Notes (Signed)
Location:    [facility] Room Number: 136/P Place of Service:  SNF [phone]) Provider: Granville Lewis PA-C  [name], MD  Patient Care Team: [name], MD as PCP - General (Family Medicine) Alyson Ingles [name], MD as Consulting Physician (Urology)  Extended Emergency Contact Information Primary Emergency Contact: [facility] Address: [place], [place] [address] Johnnette Litter of [place] Phone: [phone] Relation: Son Secondary Emergency Contact: Kroboth,Curtis  [place] Phone: [phone] Mobile Phone: [phone] Relation: Son  Code Status:  DNR Goals of care: Advanced Directive information Advanced Directives 08/09/2018  Does Patient Have a Medical Advance Directive? Yes  Type of Advance Directive Out of facility DNR (pink MOST or yellow form)  Does patient want to make changes to medical advance directive? No - Patient declined  Copy of [place] in Chart? No - copy requested  Would patient like information on creating a medical advance directive? -  Pre-existing out of facility DNR order (yellow form or pink MOST form) -     Chief Complaint  Patient presents with  . Hospitalization Follow-up    Hospitalization f/u visit  Status post rhabdomylolysis after a fall  HPI:  Pt is a 84 y.o. male seen today for a hospital follow-up after patient sustained a fall at home and was on the floor for several hours was diagnosed with rhabdomylolysis  Patient does have a history of mild dementia usually ambulates with a walker at home- also history of BPH insulin-dependent diabetes hyperlipidemia chronic kidney disease and anemia also history of edema on low-dose Lasix.  Apparently patient lives with his son at home his son does work nights however and when son returned home one night he found his father on the floor.  Father was of more confused than usual. Patient  stated he was watching television when up to stand fell he denied any loss of consciousness chest pain shortness of breath.  In ER he was found to be stable with a somewhat elevated creatinine of 2.09 with a baseline creatinine K level was elevated at 978.  Appears his hospital course was fairly unremarkable he did receive IV fluids approved.  Lab work including a B12 vitamin D TSH were within normal limits.  X-rays and CTs did not show any acute fractures or changes.  Recommendation was for PT and OT at skilled nursing  Regards to chronic kidney disease this appears to improve actually with improved from his baseline at 1.55 on September 8  In regards to type 2 diabetes he continues on Lantus 20 units in the morning as well as glipizide 10 units every morning CBG was 129 this a.m. it was 172 at at bedtime last night-hemoglobin A1c was 8.0 when done in hospital.  Regards to dementia this appears to be mild to moderate he did live with his son at home-he is on Aricept.  He also appears to have a history of anemia he is on iron hemoglobin was 11.2 in the hospital.  He is also on Lasix talking with his son he is been on this for edema- son states his edema looks significantly improved today.  Currently patient has no complaints- family states he has had some diarrhea however concerned he may be getting a bit dehydrated-we will update labs.  Vital signs appear to be stable listed blood pressure was in the 78L systolically but on manual recheck was  110.  Pulse is in the 90s- saturation is 92% on room air.  He is not complaining of any chest pain or shortness of breath or abdominal discomfort     Past Medical History:  Diagnosis Date  . Acute cholecystitis 11/2012   Drained percutaneously  . Anxiety   . BPH (benign prostatic hypertrophy)   . Deep venous thrombosis (HCC)    Left leg, diagnosed 7/12  . Dementia    Needs supervision for safety per son - POA  . Diverticulitis   .  Essential hypertension, benign   . GERD (gastroesophageal reflux disease)   . History of pneumonia    Prior to 2012  . Incontinence of urine   . Mixed hyperlipidemia   . Osteoarthritis   . Pulmonary embolism ([place])    Diagnosed 7/12  . Type 2 diabetes mellitus ([place])    Past Surgical History:  Procedure Laterality Date  . CARPAL TUNNEL RELEASE    . COLONOSCOPY  08/31/2012   Procedure: COLONOSCOPY;  Surgeon: Rogene Houston, MD;  Location: [facility];  Service: Endoscopy;  Laterality: N/A;  830  . CYSTOSCOPY WITH INJECTION N/A 05/15/2018   Procedure: CYSTOSCOPY WITH BOTOX INJECTION;  Surgeon: Cleon Gustin, MD;  Location: [facility];  Service: Urology;  Laterality: N/A;  . CYSTOSCOPY WITH INSERTION OF UROLIFT N/A 09/02/2016   Procedure: CYSTOSCOPY WITH INSERTION OF UROLIFT;  Surgeon: Cleon Gustin, MD;  Location: [facility];  Service: Urology;  Laterality: N/A;  . EYE SURGERY Bilateral    bilateral cataract  . Right total knee replacement  04/23/2011  . SPINE SURGERY  prior to 2012   Neck fusion  . UMBILICAL HERNIA REPAIR     umbilical    Allergies  Allergen Reactions  . Zosyn [Piperacillin Sod-Tazobactam So] Anaphylaxis, Swelling and Other (See Comments)    Facial swelling Has patient had a PCN reaction causing immediate rash, facial/tongue/throat swelling, SOB or lightheadedness with hypotension: Yes Has patient had a PCN reaction causing severe rash involving mucus membranes or skin necrosis: No Has patient had a PCN reaction that required hospitalization: No Has patient had a PCN reaction occurring within the last 10 years: Yes If all of the above answers are "NO", then may proceed with Cephalosporin use.   . Ace Inhibitors Cough  . Vancomycin Swelling and Other (See Comments)    Facial swelling    Outpatient Encounter Medications as of 08/09/2018  Medication Sig  . acetaminophen (TYLENOL) 500 MG tablet Take 1,000 mg by mouth every 6 (six) hours as  needed for mild pain, moderate pain or headache.   . Carboxymethylcellul-Glycerin 0.5-0.9 % SOLN Place 1 drop into both eyes 4 (four) times daily as needed (for dry eyes).   [name] donepezil (ARICEPT) 10 MG tablet TAKE 1 TABLET AT BEDTIME  . FEROSUL 325 (65 Fe) MG tablet TAKE 1 TABLET TWICE DAILY  . furosemide (LASIX) 20 MG tablet Take 1 tablet (20 mg total) by mouth daily.  [name] glipiZIDE (GLUCOTROL XL) 10 MG 24 hr tablet Take 1 tablet (10 mg total) by mouth daily with breakfast.  . glucose blood (TRUE METRIX BLOOD GLUCOSE TEST) test strip TEST ONCE DAILY dx e11.9  . Insulin Glargine (LANTUS SOLOSTAR) 100 UNIT/ML Solostar Pen Inject 20 Units into the skin daily as needed (if blood sugar is over 100). If blood sugar is over 100  . Insulin Pen Needle (PEN NEEDLES) 31G X 5 MM MISC 1 each by Does not apply route as  directed. Use to inject lantus daily dx E11.65  . loratadine (CLARITIN) 10 MG tablet Take 10 mg by mouth every morning.   . lovastatin (MEVACOR) 40 MG tablet TAKE 1 TABLET AT BEDTIME  . omeprazole (PRILOSEC) 40 MG capsule TAKE 1 CAPSULE EVERY DAY  . sertraline (ZOLOFT) 50 MG tablet TAKE 1 TABLET EVERY DAY  . tamsulosin (FLOMAX) 0.4 MG CAPS capsule TAKE 1 CAPSULE EVERY DAY  . Trospium Chloride 60 MG CP24 Take 60 mg by mouth daily.   . Vitamin D, Ergocalciferol, (DRISDOL) 50000 units CAPS capsule TAKE 1 CAPSULE EVERY 7 (SEVEN) DAYS.  . [DISCONTINUED] clobetasol cream (TEMOVATE) 1.95 % APPLY 1 APPLICATION TOPICALLY 2 (TWO) TIMES DAILY.   No facility-administered encounter medications on file as of 08/09/2018.      Review of Systems   This is a bit limited since patient appears to be somewhat of a poor historian.  In general no complaints of fever chills says he feels good.  Skin does not complain of rashes itching or diaphoresis.  Head ears eyes nose mouth and throat is not complaining of any visual changes or sore throat.  Respiratory denies shortness of breath or cough.  Cardiac  denies chest pain appears to have fairly minimal edema according to his son this appears probably improved from baseline.  GI is not complaining of abdominal pain nausea or vomiting family thinks he is having some diarrhea.  GU is not complaining of dysuria.  Musculoskeletal does have weakness but is not complaining of joint pain.  Neurologic negative for headache or dizziness does have weakness.  Psych does have a mild/moderate dementia but does not complain of being depressed or anxious appears to be in good spirits  Immunization History  Administered Date(s) Administered  . Influenza Split 10/14/2011, 09/26/2012, 09/29/2013  . Influenza Whole 09/07/2007, 09/04/2009, 08/04/2010  . Influenza,inj,Quad PF,6+ Mos 09/09/2014, 08/13/2016, 10/05/2017  . Pneumococcal Conjugate-13 07/16/2014  . Pneumococcal Polysaccharide-23 08/04/2010  . Td 07/28/2004  . Zoster 03/08/2007   Pertinent  Health Maintenance Due  Topic Date Due  . INFLUENZA VACCINE  09/08/2018 (Originally 06/29/2018)  . OPHTHALMOLOGY EXAM  09/08/2018 (Originally 09/21/2016)  . URINE MICROALBUMIN  09/08/2018 (Originally 02/24/2018)  . FOOT EXAM  01/28/2019  . HEMOGLOBIN A1C  02/01/2019  . PNA vac Low Risk Adult  Completed   Fall Risk  06/06/2018 06/21/2017 12/01/2016 11/27/2015 04/22/2015  Falls in the past year? No No No No No  Risk for fall due to : - - - - Impaired balance/gait   Functional Status Survey:    Vitals:   08/09/18 1005  BP: 132/62  Pulse: 77  Resp: 18  Temp: 97.9 F (36.6 C)  TempSrc: Oral  SpO2: 98%  Of note manual blood pressure 110/62- pulse was 98 O2 saturation was 92% on room air   Physical Exam   In general this is a very pleasant elderly male in no distress lying comfortably in bed.  His skin is warm and dry.  Eyes sclera and conjunctive are clear visual acuity appears to be grossly intact.  Oropharynx is clear mucous membranes moist.  Chest is clear to auscultation with somewhat shallow  air entry there is no labored breathing.  Heart is distant heart sounds from what I could ascertain was regular rate and rhythm radial pulse was regular pulse was in the 90s.  Abdomen is somewhat obese soft nontender with positive bowel sounds.  Musculoskeletal Limited exam since he is in bed but appears able to move all  extremities x4 lower extremity weakness deconditioning-.  Neurologic is grossly intact his speech is clear no lateralizing findings  Psych he is oriented to self he is pleasant appropriate appears to have some mild/moderate dementia cognitive deficits-     Labs reviewed: Recent Labs    08/03/18 0934 08/04/18 0439 08/06/18 0604  NA 139 138 140  K 4.4 4.1 4.5  CL 105 108 106  CO2 26 22 26   GLUCOSE 173* 164* 116*  BUN 23 23 20   CREATININE 2.09* 1.80* 1.55*  CALCIUM 9.1 8.2* 8.1*   Recent Labs    09/15/17 0539 09/18/17 0756 09/29/17 0948 08/01/18 1419 08/03/18 0934  AST 26 34 12 13 34  ALT 15* 22 10 9 18   ALKPHOS 60 64  --   --  63  BILITOT 1.3* 0.9 0.9 0.7 1.4*  PROT 6.9 7.4 6.5 6.9 8.0  ALBUMIN 2.9* 3.1*  --   --  3.7   Recent Labs    09/14/17 1546  05/10/18 1410  08/03/18 0934 08/04/18 0439 08/06/18 0604  WBC 8.2   < > 4.5   < > 7.7 9.7 5.6  NEUTROABS 6.4  --  2.4  --  6.0  --   --   HGB 12.0*   < > 11.2*   < > 12.5* 11.5* 11.2*  HCT 36.2*   < > 35.3*   < > 38.1* 35.2* 34.4*  MCV 92.1   < > 93.1   < > 93.8 93.6 93.7  PLT 166   < > 155   < > 173 161 169   < > = values in this interval not displayed.   Lab Results  Component Value Date   TSH 0.562 08/03/2018   Lab Results  Component Value Date   HGBA1C 8.0 (H) 08/03/2018   Lab Results  Component Value Date   CHOL 140 05/29/2018   HDL 39 (L) 05/29/2018   LDLCALC 85 05/29/2018   TRIG 74 05/29/2018   CHOLHDL 3.6 05/29/2018    Significant Diagnostic Results in last 30 days:  No results found.  Assessment/Plan  : #1 history of endometriosis status post fall- he appears to have  made a nice recovery from this he will need PT and OT-he did receive IV fluids apparently with improvement at this point continue therapy.  2.  Metabolic encephalopathy thought possibly secondary to dehydration after fall- complicated with dementia- labs apparently improved with IV hydration will update this.  3.  Type 2 diabetes continues on Lantus as well as glipizide in the a.m. CBG this morning was 129 last night was 172 at this point continue to monitor CBGs- hemoglobin A1c was 8.0 in the hospital I suspect this reflects prior control.  4.  History of dementia this appears to be mild to moderate he appears to be doing well with supportive care he does live at home with the son who is very supportive-he is on Aricept- apparently he was walking at home with a walker.  5.  History of chronic kidney disease again creatinine was mildly elevated from baseline on admission but actually was improved beyond baseline at discharge at 1.55 baseline is 1.7-1.9-- at this point will update labs but  since family is concerned he may be getting a bit dry  Also will hold Lasix for now  #6- history of hyperlipidemia with goal LDL less than 100 LDL was 85 on lab done in July he is on a statin.  7.  History of anemia he  is on iron hemoglobin was 11.2 in hospital will monitor periodically.  8.  History of edema according to his son this actually shows improvement-he is on low-dose Lasix-will hold it for now pending updated labs- with a history of renal insufficiency.  9.  Allergic rhinitis at this point appears relatively asymptomatic-she is on Claritin.  10 depression continues on Zoloft he appears to be in good spirits today.  11.  Hypertension-this does not appear to be the case today do not know any elevated readings  today he is on low-dose Lasix again we are going to hold his Lasix for now.  12.-History of BPH he continues on Flomax-he continues on [place] for overactive bladder as well.  13.-   History of diarrhea- family says he has had some diarrhea this morning- we will order C. difficile any loose stools-again will update his labs.  Also will update a d-dimer secondary to currently a distant history of pulmonary embolism at times some borderline tachycardia- he appears to be stable in this regards but since we are drawing labs will obtain this as well  #14-I do note his bilirubin was mildly elevated in the hospital at 1.4 again will update this he denies any abdominal discomfort nausea or vomiting I do not see really see evidence of jaundice  Again will order labs as noted above-also C. difficile any loose stool- monitor vital signs every 4 hours with pulse ox x3 and then every shift- Will hold Lasix for now   [id number]- of note greater than 50 minutes spent assessing patient- reviewing his chart and labs-discussing his status with family at bedside- coordinating and formulating a plan of care for numerous diagnoses- of note greater than 50% of time spent coordinating a plan of care

## 2018-08-09 NOTE — ED Notes (Signed)
Patient transported by wheelchair and [facility] staff back to the [facility].

## 2018-08-09 NOTE — ED Triage Notes (Signed)
Pt brought over from the [facility]  Per the PA for an elevated D-Dimer; pt has no complaints at this time

## 2018-08-09 NOTE — ED Provider Notes (Signed)
[facility] EMERGENCY DEPARTMENT Provider Note   CSN: [id number] Arrival date & time: 08/09/18  2015     History   Chief Complaint Chief Complaint  Patient presents with  . abnormal labs    HPI Samuel Ryan is a 84 y.o. male.  Level 5 caveat for dementia.  Patient brought to the emergency department from the nursing home for an elevated d-dimer.  He complains of back pain, but no chest pain, dyspnea, dysuria, fever, sweats, chills.  Uncertain why d-dimer was ordered in the first place.     Past Medical History:  Diagnosis Date  . Acute cholecystitis 11/2012   Drained percutaneously  . Anxiety   . BPH (benign prostatic hypertrophy)   . Deep venous thrombosis (HCC)    Left leg, diagnosed 7/12  . Dementia    Needs supervision for safety per son - POA  . Diverticulitis   . Essential hypertension, benign   . GERD (gastroesophageal reflux disease)   . History of pneumonia    Prior to 2012  . Incontinence of urine   . Mixed hyperlipidemia   . Osteoarthritis   . Pulmonary embolism ([place])    Diagnosed 7/12  . Type 2 diabetes mellitus [facility])     Patient Active Problem List   Diagnosis Date Noted  . Rhabdomyolysis 08/03/2018  . New onset of confusion 08/01/2018  . Morbid obesity ([place]) 06/12/2018  . Anemia 09/14/2017  . CKD (chronic kidney disease) stage 3, GFR 30-59 ml/min (HCC) 09/14/2017  . Atrial fibrillation ([place]) 09/14/2017  . Hypoalbuminemia 11/23/2016  . Hypoxia 10/29/2016  . Urinary frequency 09/17/2015  . At high risk for falls 03/30/2015  . Generalized osteoarthritis 07/16/2014  . Urinary incontinence 07/16/2014  . Pulmonary interstitial fibrosis ([employer]) 07/03/2014  . Abnormal CXR (chest x-ray) 05/27/2014  . Thyroid mass of unclear etiology 05/07/2014  . Vitamin D deficiency 01/31/2014  . Allergic rhinitis 01/31/2014  . Diverticulosis of sigmoid colon 12/13/2012  . Right ventricular dysfunction 07/20/2011  . Heart disease, unspecified 07/20/2011  .  Alzheimer's dementia without behavioral disturbance 09/12/2010  . Goiter 06/19/2009  . Essential hypertension 12/11/2008  . Diabetes mellitus, insulin dependent (IDDM), controlled ([place]) 09/05/2008  . Hyperlipidemia with target LDL less than 100 07/19/2008  . BPH (benign prostatic hyperplasia) 03/28/2008    Past Surgical History:  Procedure Laterality Date  . CARPAL TUNNEL RELEASE    . COLONOSCOPY  08/31/2012   Procedure: COLONOSCOPY;  Surgeon: Rogene Houston, MD;  [facility];  Service: Endoscopy;  Laterality: N/A;  830  . CYSTOSCOPY WITH INJECTION N/A 05/15/2018   Procedure: CYSTOSCOPY WITH BOTOX INJECTION;  Surgeon: Cleon Gustin, MD;  Location: [facility] ORS;  Service: Urology;  Laterality: N/A;  . CYSTOSCOPY WITH INSERTION OF UROLIFT N/A 09/02/2016   Procedure: CYSTOSCOPY WITH INSERTION OF UROLIFT;  Surgeon: Cleon Gustin, MD;  Location: [facility];  Service: Urology;  Laterality: N/A;  . EYE SURGERY Bilateral    bilateral cataract  . Right total knee replacement  04/23/2011  . SPINE SURGERY  prior to 2012   Neck fusion  . UMBILICAL HERNIA REPAIR     umbilical        Home Medications    Prior to Admission medications   Medication Sig Start Date End Date Taking? Authorizing Provider  acetaminophen (TYLENOL) 500 MG tablet Take 1,000 mg by mouth every 6 (six) hours as needed for mild pain, moderate pain or headache.    Yes [provider]  Carboxymethylcellul-Glycerin 0.5-0.9 % SOLN Place 1 drop into both eyes 4 (four) times daily as needed (for dry eyes).    Yes [provider]  donepezil (ARICEPT) 10 MG tablet TAKE 1 TABLET AT BEDTIME Patient taking differently: Take 10 mg by mouth at bedtime.  03/27/18  Yes [name], MD  FEROSUL 325 (65 Fe) MG tablet TAKE 1 TABLET TWICE DAILY Patient taking differently: Take 325 mg by mouth 2 (two) times daily with a meal.  03/27/18  Yes [name], MD  furosemide (LASIX) 20  MG tablet Take 1 tablet (20 mg total) by mouth daily. 07/04/18 10/02/18 Yes [name], MD  glipiZIDE (GLUCOTROL XL) 10 MG 24 hr tablet Take 1 tablet (10 mg total) by mouth daily with breakfast. 06/06/18  Yes [name], MD  Insulin Glargine (LANTUS SOLOSTAR) 100 UNIT/ML Solostar Pen Inject 20 Units into the skin daily. HOLD for blood sugar levels below 100 06/06/18  Yes [name], MD  loratadine (CLARITIN) 10 MG tablet Take 10 mg by mouth every morning.    Yes [provider]  lovastatin (MEVACOR) 40 MG tablet TAKE 1 TABLET AT BEDTIME Patient taking differently: Take 40 mg by mouth at bedtime.  03/27/18  Yes [name], MD  omeprazole (PRILOSEC) 40 MG capsule TAKE 1 CAPSULE EVERY DAY Patient taking differently: Take 40 mg by mouth daily.  03/27/18  Yes [name], MD  sertraline (ZOLOFT) 50 MG tablet TAKE 1 TABLET EVERY DAY Patient taking differently: Take 50 mg by mouth daily.  03/27/18  Yes [name], MD  tamsulosin (FLOMAX) 0.4 MG CAPS capsule TAKE 1 CAPSULE EVERY DAY Patient taking differently: Take 0.4 mg by mouth daily.  03/27/18  Yes [name], MD  Trospium Chloride 60 MG CP24 Take 60 mg by mouth daily.  06/02/17  Yes [provider]  Vitamin D, Ergocalciferol, (DRISDOL) 50000 units CAPS capsule TAKE 1 CAPSULE EVERY 7 (SEVEN) DAYS. Patient taking differently: Take 50,000 Units by mouth every Sunday.  01/30/18  Yes [name], MD  glucose blood (TRUE METRIX BLOOD GLUCOSE TEST) test strip TEST ONCE DAILY dx e11.9 06/07/18   [name], MD  Insulin Pen Needle (PEN NEEDLES) 31G X 5 MM MISC 1 each by Does not apply route as directed. Use to inject lantus daily dx E11.65 02/14/18   [name], MD    Family History Family History  Problem Relation Age of Onset  . Cancer Mother        Stomach  . Diabetes Sister   . Hypertension Sister   . Diabetes Brother   . Cancer Brother        Gallbladder  .  Seizures Son        due to meningitis as a child    Social History Social History   Tobacco Use  . Smoking status: Never Smoker  . Smokeless tobacco: Never Used  Substance Use Topics  . Alcohol use: No  . Drug use: No     Allergies   Zosyn [piperacillin sod-tazobactam so]; Ace inhibitors; and Vancomycin   Review of Systems Review of Systems  Unable to perform ROS: Dementia     Physical Exam Updated Vital Signs BP (!) 108/57   Pulse 77   Temp 98.2 F (36.8 C) (Oral)   Resp 18   Ht 5\' 10"  (1.778 m)   Wt 115.4 kg   SpO2 97%   BMI 36.50 kg/m  Physical Exam  Constitutional: He is oriented to person, place, and time.  nad  HENT:  Head: Normocephalic and atraumatic.  Eyes: Conjunctivae are normal.  Neck: Neck supple.  Cardiovascular: Normal rate and regular rhythm.  Pulmonary/Chest: Effort normal and breath sounds normal.  Abdominal: Soft. Bowel sounds are normal.  Musculoskeletal: Normal range of motion.  Neurological: He is alert and oriented to person, place, and time.  Skin: Skin is warm and dry.  Psychiatric: He has a normal mood and affect. His behavior is normal.  Nursing note and vitals reviewed.    ED Treatments / Results  Labs (all labs ordered are listed, but only abnormal results are displayed) Labs Reviewed - No data to display  EKG None  Radiology Ct Angio Chest Pe W And/or Wo Contrast  Result Date: 08/09/2018 CLINICAL DATA:  Elevated D-dimer, dementia, essential benign hypertension, GERD, type II diabetes mellitus history of pulmonary embolism EXAM: CT ANGIOGRAPHY CHEST WITH CONTRAST TECHNIQUE: Multidetector CT imaging of the chest was performed using the standard protocol during bolus administration of intravenous contrast. Multiplanar CT image reconstructions and MIPs were obtained to evaluate the vascular anatomy. CONTRAST:  43mL ISOVUE-370 IOPAMIDOL (ISOVUE-370) INJECTION 76% IV [id number] COMPARISON:  CT chest 05/09/2014, CTA chest  12/01/2012 FINDINGS: Cardiovascular: Atherosclerotic calcifications aorta and proximal great vessels. Minimal aneurysmal dilatation of the ascending thoracic aorta 4.0 cm transverse image 28. Pulmonary arteries adequately opacified and patent. No evidence of pulmonary embolism. No pericardial effusion. Heart appears enlarged. Mediastinum/Nodes: BILATERAL thyroid lobe enlargement LEFT greater than RIGHT. Esophagus unremarkable. No adenopathy. Lungs/Pleura: Bibasilar atelectasis. No infiltrate, pleural effusion or pneumothorax. Upper Abdomen: No acute abnormalities. Musculoskeletal: Degenerative disc disease changes thoracic spine. Review of the MIP images confirms the above findings. IMPRESSION: No evidence of pulmonary embolism. Aneurysmal dilatation ascending thoracic aorta 4.0 cm transverse, recommendation below. Recommend annual imaging followup by CTA or MRA. This recommendation follows 2010 ACCF/AHA/AATS/ACR/ASA/SCA/SCAI/SIR/STS/SVM Guidelines for the Diagnosis and Management of Patients with Thoracic Aortic Disease. Circulation. 2010; 121: X902-I097 Aortic Atherosclerosis (ICD10-I70.0). Aortic aneurysm NOS (ICD10-I71.9). Electronically Signed   By: Lavonia Dana M.D.   On: 08/09/2018 22:23    Procedures Procedures (including critical care time)  Medications Ordered in ED Medications  sodium chloride 0.9 % bolus 500 mL (0 mLs Intravenous Stopped 08/09/18 2244)  iopamidol (ISOVUE-370) 76 % injection 100 mL (75 mLs Intravenous Contrast Given 08/09/18 2144)     Initial Impression / Assessment and Plan / ED Course  I have reviewed the triage vital signs and the nursing notes.  Pertinent labs & imaging results that were available during my care of the patient were reviewed by me and considered in my medical decision making (see chart for details).     Patient presents with elevated d-dimer and back pain.  CT angiogram shows no evidence of a pulmonary embolism; however, a 4 cm ascending thoracic  aneurysm was noted.  This was discussed with the patient and his son.  Recommend follow-up angiogram in 1 year.  Final Clinical Impressions(s) / ED Diagnoses   Final diagnoses:  Elevated d-dimer  Thoracic aortic aneurysm without rupture [facility])    ED Discharge Orders    None       Nat Christen, MD 08/09/18 2328

## 2018-08-09 NOTE — ED Notes (Signed)
Patient cleaned and patient's pull up changed. Patient re-positioned.

## 2018-08-10 ENCOUNTER — Encounter (HOSPITAL_COMMUNITY)
Admission: AD | Admit: 2018-08-10 | Discharge: 2018-08-10 | Disposition: A | Discharge status: Home / Self Care | Payer: Medicare HMO | Source: Skilled Nursing Facility | Attending: Internal Medicine | Admitting: Internal Medicine

## 2018-08-10 ENCOUNTER — Inpatient Hospital Stay
Admission: RE | Admit: 2018-08-10 | Discharge: 2018-08-11 | Disposition: A | Discharge status: Nursing Home (Includes ICF) | Payer: Medicare HMO | Source: Ambulatory Visit | Attending: Internal Medicine | Admitting: Internal Medicine

## 2018-08-10 ENCOUNTER — Non-Acute Institutional Stay (SKILLED_NURSING_FACILITY): Payer: Medicare HMO | Admitting: Internal Medicine

## 2018-08-10 ENCOUNTER — Encounter: Payer: Self-pay | Admitting: Internal Medicine

## 2018-08-10 DIAGNOSIS — N39498 Other specified urinary incontinence: Secondary | ICD-10-CM

## 2018-08-10 DIAGNOSIS — M6282 Rhabdomyolysis: Secondary | ICD-10-CM | POA: Diagnosis not present

## 2018-08-10 DIAGNOSIS — G308 Other Alzheimer's disease: Secondary | ICD-10-CM

## 2018-08-10 DIAGNOSIS — I10 Essential (primary) hypertension: Secondary | ICD-10-CM | POA: Diagnosis not present

## 2018-08-10 DIAGNOSIS — F0280 Dementia in other diseases classified elsewhere without behavioral disturbance: Secondary | ICD-10-CM | POA: Diagnosis not present

## 2018-08-10 DIAGNOSIS — Z794 Long term (current) use of insulin: Secondary | ICD-10-CM | POA: Diagnosis not present

## 2018-08-10 DIAGNOSIS — E119 Type 2 diabetes mellitus without complications: Secondary | ICD-10-CM

## 2018-08-10 DIAGNOSIS — W19XXXS Unspecified fall, sequela: Secondary | ICD-10-CM | POA: Diagnosis not present

## 2018-08-10 DIAGNOSIS — N183 Chronic kidney disease, stage 3 unspecified: Secondary | ICD-10-CM

## 2018-08-10 DIAGNOSIS — IMO0001 Reserved for inherently not codable concepts without codable children: Secondary | ICD-10-CM

## 2018-08-10 DIAGNOSIS — W19XXXA Unspecified fall, initial encounter: Secondary | ICD-10-CM | POA: Insufficient documentation

## 2018-08-10 LAB — COMPREHENSIVE METABOLIC PANEL
ALK PHOS: 54 U/L (ref 38–126)
ALT: 17 U/L (ref 0–44)
AST: 19 U/L (ref 15–41)
Albumin: 2.9 g/dL — ABNORMAL LOW (ref 3.5–5.0)
Anion gap: 8 (ref 5–15)
BILIRUBIN TOTAL: 0.7 mg/dL (ref 0.3–1.2)
BUN: 22 mg/dL (ref 8–23)
CO2: 25 mmol/L (ref 22–32)
CREATININE: 1.7 mg/dL — AB (ref 0.61–1.24)
Calcium: 8.2 mg/dL — ABNORMAL LOW (ref 8.9–10.3)
Chloride: 104 mmol/L (ref 98–111)
GFR calc Af Amer: 41 mL/min — ABNORMAL LOW (ref >60)
GFR calc non Af Amer: 35 mL/min — ABNORMAL LOW (ref >60)
GLUCOSE: 122 mg/dL — AB (ref 70–99)
POTASSIUM: 4.2 mmol/L (ref 3.5–5.1)
Sodium: 137 mmol/L (ref 135–145)
TOTAL PROTEIN: 6.9 g/dL (ref 6.5–8.1)

## 2018-08-10 LAB — CBC WITH DIFFERENTIAL/PLATELET
Basophils Absolute: 0 10*3/uL (ref 0.0–0.1)
Basophils Relative: 0 %
EOS ABS: 0.1 10*3/uL (ref 0.0–0.7)
Eosinophils Relative: 2 %
HCT: 33.7 % — ABNORMAL LOW (ref 39.0–52.0)
HEMOGLOBIN: 11.1 g/dL — AB (ref 13.0–17.0)
LYMPHS ABS: 1.7 10*3/uL (ref 0.7–4.0)
LYMPHS PCT: 35 %
MCH: 30.7 pg (ref 26.0–34.0)
MCHC: 32.9 g/dL (ref 30.0–36.0)
MCV: 93.1 fL (ref 78.0–100.0)
Monocytes Absolute: 0.6 10*3/uL (ref 0.1–1.0)
Monocytes Relative: 11 %
NEUTROS PCT: 52 %
Neutro Abs: 2.6 10*3/uL (ref 1.7–7.7)
Platelets: 202 10*3/uL (ref 150–400)
RBC: 3.62 MIL/uL — AB (ref 4.22–5.81)
RDW: 13.9 % (ref 11.5–15.5)
WBC: 5 10*3/uL (ref 4.0–10.5)

## 2018-08-10 LAB — D-DIMER, QUANTITATIVE (NOT AT ARMC): D DIMER QUANT: 2.78 ug{FEU}/mL — AB (ref 0.00–0.50)

## 2018-08-10 NOTE — Progress Notes (Signed)
Provider:  Veleta Miners MD Location:    [place] Room Number: 136/P Place of Service:  SNF ([phone])  PCP: [name], MD Patient Care Team: [name], MD as PCP - General (Family Medicine) McKenzie, [name], MD as Consulting Physician (Urology)  Extended Emergency Contact Information Primary Emergency Contact: [facility] Address: [place], [place] [address] [address] [place] Phone: [phone] Relation: Son Secondary Emergency Contact: Quiett,Curtis  [place] Phone: [phone] Mobile Phone: [phone] Relation: Son  Code Status: DNR Goals of Care: Advanced Directive information Advanced Directives 08/10/2018  Does Patient Have a Medical Advance Directive? Yes  Type of Advance Directive Out of facility DNR (pink MOST or yellow form)  Does patient want to make changes to medical advance directive? No - Patient declined  Copy of [place] in Chart? No - copy requested  Would patient like information on creating a medical advance directive? -  Pre-existing out of facility DNR order (yellow form or pink MOST form) -      Chief Complaint  Patient presents with  . New Admit To SNF    New Admission Visit    HPI: Patient is a 84 y.o. male seen today for admission to SNF for therapy after staying in the hospital from 09/05-09/10 after a fall and weakness.  Patient has H/o diabetes on insulin hypertension, hyperlipidemia, BPH with Urinary Incontinence. He lives with his son and [place] at home.  His son who works nights found his father on the floor when he returned back.  He also thought his father was more confused than normal.  Patient denied any LOC, chest pain, dizziness.  He said his legs just gave way and he was unable to and on his own.  In the ED his CPK level was 978.  His creatinine was 2.09 and he was little dehydrated.  His work-up in the hospital  including x-ray and CT were negative for any acute change.  His B12 and vitamin D were normal.  His TSH was normal.  He was evaluated by therapy and SNF was recommended. Patient doing well and is already walking with the walker and mild assist. He denies any shortness of breath or chest pain.  He does have some cough.  But no fever or chills Patient lives with her's his son.  He he is usually by himself at night.  He i walks with a walker and is independent in his ADL  Past Medical History:  Diagnosis Date  . Acute cholecystitis 11/2012   Drained percutaneously  . Anxiety   . BPH (benign prostatic hypertrophy)   . Deep venous thrombosis (HCC)    Left leg, diagnosed 7/12  . Dementia    Needs supervision for safety per son - POA  . Diverticulitis   . Essential hypertension, benign   . GERD (gastroesophageal reflux disease)   . History of pneumonia    Prior to 2012  . Incontinence of urine   . Mixed hyperlipidemia   . Osteoarthritis   . Pulmonary embolism ([place])    Diagnosed 7/12  . Type 2 diabetes mellitus ([place])    Past Surgical History:  Procedure Laterality Date  . CARPAL TUNNEL RELEASE    . COLONOSCOPY  08/31/2012   Procedure: COLONOSCOPY;  Surgeon: Rogene Houston, MD;  [facility];  Service: Endoscopy;  Laterality: N/A;  830  .  CYSTOSCOPY WITH INJECTION N/A 05/15/2018   Procedure: CYSTOSCOPY WITH BOTOX INJECTION;  Surgeon: Cleon Gustin, MD;  Location: [facility] ORS;  Service: Urology;  Laterality: N/A;  . CYSTOSCOPY WITH INSERTION OF UROLIFT N/A 09/02/2016   Procedure: CYSTOSCOPY WITH INSERTION OF UROLIFT;  Surgeon: Cleon Gustin, MD;  [facility];  Service: Urology;  Laterality: N/A;  . EYE SURGERY Bilateral    bilateral cataract  . Right total knee replacement  04/23/2011  . SPINE SURGERY  prior to 2012   Neck fusion  . UMBILICAL HERNIA REPAIR     umbilical    reports that he has never smoked. He has never used smokeless tobacco.  He reports that he does not drink alcohol or use drugs. Social History   Socioeconomic History  . Marital status: Widowed    Spouse name: Not on file  . Number of children: Not on file  . Years of education: Not on file  . Highest education level: Not on file  Occupational History  . Not on file  Social Needs  . Financial resource strain: Not on file  . Food insecurity:    Worry: Not on file    Inability: Not on file  . Transportation needs:    Medical: Not on file    Non-medical: Not on file  Tobacco Use  . Smoking status: Never Smoker  . Smokeless tobacco: Never Used  Substance and Sexual Activity  . Alcohol use: No  . Drug use: No  . Sexual activity: Never  Lifestyle  . Physical activity:    Days per week: Not on file    Minutes per session: Not on file  . Stress: Not on file  Relationships  . Social connections:    Talks on phone: Not on file    Gets together: Not on file    Attends religious service: Not on file    Active member of club or organization: Not on file    Attends meetings of clubs or organizations: Not on file    Relationship status: Not on file  . Intimate partner violence:    Fear of current or ex partner: Not on file    Emotionally abused: Not on file    Physically abused: Not on file    Forced sexual activity: Not on file  Other Topics Concern  . Not on file  Social History Narrative  . Not on file    Functional Status Survey:    Family History  Problem Relation Age of Onset  . Cancer Mother        Stomach  . Diabetes Sister   . Hypertension Sister   . Diabetes Brother   . Cancer Brother        Gallbladder  . Seizures Son        due to meningitis as a child    Health Maintenance  Topic Date Due  . INFLUENZA VACCINE  09/08/2018 (Originally 06/29/2018)  . OPHTHALMOLOGY EXAM  09/08/2018 (Originally 09/21/2016)  . URINE MICROALBUMIN  09/08/2018 (Originally 02/24/2018)  . TETANUS/TDAP  12/12/2018 (Originally 07/28/2014)  . FOOT EXAM   01/28/2019  . HEMOGLOBIN A1C  02/01/2019  . PNA vac Low Risk Adult  Completed    Allergies  Allergen Reactions  . Zosyn [Piperacillin Sod-Tazobactam So] Anaphylaxis, Swelling and Other (See Comments)    Facial swelling Has patient had a PCN reaction causing immediate rash, facial/tongue/throat swelling, SOB or lightheadedness with hypotension: Yes Has patient had a PCN reaction causing  severe rash involving mucus membranes or skin necrosis: No Has patient had a PCN reaction that required hospitalization: No Has patient had a PCN reaction occurring within the last 10 years: Yes If all of the above answers are "NO", then may proceed with Cephalosporin use.   . Ace Inhibitors Cough  . Vancomycin Swelling and Other (See Comments)    Facial swelling    Outpatient Encounter Medications as of 08/10/2018  Medication Sig  . acetaminophen (TYLENOL) 500 MG tablet Take 1,000 mg by mouth every 6 (six) hours as needed for mild pain, moderate pain or headache.   . Carboxymethylcellul-Glycerin 0.5-0.9 % SOLN Place 1 drop into both eyes 4 (four) times daily as needed (for dry eyes).   [name] donepezil (ARICEPT) 10 MG tablet TAKE 1 TABLET AT BEDTIME  . FEROSUL 325 (65 Fe) MG tablet TAKE 1 TABLET TWICE DAILY  . glipiZIDE (GLUCOTROL XL) 10 MG 24 hr tablet Take 1 tablet (10 mg total) by mouth daily with breakfast.  . glucose blood (TRUE METRIX BLOOD GLUCOSE TEST) test strip TEST ONCE DAILY dx e11.9  . Insulin Glargine (LANTUS SOLOSTAR) 100 UNIT/ML Solostar Pen Inject 20 Units into the skin daily. HOLD for blood sugar levels below 100  . Insulin Pen Needle (PEN NEEDLES) 31G X 5 MM MISC 1 each by Does not apply route as directed. Use to inject lantus daily dx E11.65  . loratadine (CLARITIN) 10 MG tablet Take 10 mg by mouth every morning.   . lovastatin (MEVACOR) 40 MG tablet TAKE 1 TABLET AT BEDTIME  . omeprazole (PRILOSEC) 40 MG capsule TAKE 1 CAPSULE EVERY DAY  . sertraline (ZOLOFT) 50 MG tablet TAKE 1 TABLET  EVERY DAY  . tamsulosin (FLOMAX) 0.4 MG CAPS capsule TAKE 1 CAPSULE EVERY DAY  . Trospium Chloride 60 MG CP24 Take 60 mg by mouth daily.   . Vitamin D, Ergocalciferol, (DRISDOL) 50000 units CAPS capsule TAKE 1 CAPSULE EVERY 7 (SEVEN) DAYS.  . [DISCONTINUED] furosemide (LASIX) 20 MG tablet Take 1 tablet (20 mg total) by mouth daily.   No facility-administered encounter medications on file as of 08/10/2018.      Review of Systems  Review of Systems  Constitutional: Negative for activity change, appetite change, chills, diaphoresis, fatigue and fever.  HENT: Negative for mouth sores, postnasal drip, rhinorrhea, sinus pain and sore throat.   Respiratory: Negative for apnea, , chest tightness, shortness of breath and wheezing.   Cardiovascular: Negative for chest pain, palpitations and leg swelling.  Gastrointestinal: Negative for abdominal distention, abdominal pain, , diarrhea, nausea and vomiting. Positive for Constipation Genitourinary: Negative for dysuria and frequency.  Musculoskeletal: Negative for arthralgias, joint swelling and myalgias.  Skin: Negative for rash.  Neurological: Negative for dizziness, syncope, weakness, light-headedness and numbness.  Psychiatric/Behavioral: Negative for behavioral problems, confusion and sleep disturbance.     Vitals:   08/10/18 1013  BP: 124/74  Pulse: 79  Resp: 18  Temp: 98.1 F (36.7 C)  TempSrc: Oral  SpO2: 94%   There is no height or weight on file to calculate BMI. Physical Exam  Constitutional: He appears well-developed and well-nourished.  HENT:  Head: Normocephalic.  Mouth/Throat: Oropharynx is clear and moist.  Eyes: Pupils are equal, round, and reactive to light.  Neck: Neck supple.  Cardiovascular: Normal rate and regular rhythm.  Pulmonary/Chest: Effort normal and breath sounds normal. No stridor. No respiratory distress. He has no wheezes.  Abdominal: Soft. Bowel sounds are normal. He exhibits no distension. There is no  tenderness. There is no guarding.  Musculoskeletal:  Mild Edema Bilateral  Neurological: He is alert.  Didn't know the Year or Month. Got confused of the name oif the place.  No Focal Deficits. Follows all commands  Skin: Skin is warm and dry.  Psychiatric: He has a normal mood and affect. His behavior is normal. Thought content normal.    Labs reviewed: Basic Metabolic Panel: Recent Labs    08/06/18 0604 08/09/18 1620 08/10/18 0703  NA 140 133* 137  K 4.5 4.0 4.2  CL 106 102 104  CO2 26 24 25   GLUCOSE 116* 182* 122*  BUN 20 22 22   CREATININE 1.55* 1.73* 1.70*  CALCIUM 8.1* 8.1* 8.2*   Liver Function Tests: Recent Labs    08/03/18 0934 08/09/18 1620 08/10/18 0703  AST 34 21 19  ALT 18 16 17   ALKPHOS 63 58 54  BILITOT 1.4* 0.5 0.7  PROT 8.0 6.9 6.9  ALBUMIN 3.7 2.9* 2.9*   No results for input(s): LIPASE, AMYLASE in the last 8760 hours. No results for input(s): AMMONIA in the last 8760 hours. CBC: Recent Labs    08/03/18 0934  08/06/18 0604 08/09/18 1620 08/10/18 0704  WBC 7.7   < > 5.6 5.0 5.0  NEUTROABS 6.0  --   --  3.1 2.6  HGB 12.5*   < > 11.2* 11.6* 11.1*  HCT 38.1*   < > 34.4* 34.6* 33.7*  MCV 93.8   < > 93.7 92.3 93.1  PLT 173   < > 169 184 202   < > = values in this interval not displayed.   Cardiac Enzymes: Recent Labs    09/14/17 2216 09/15/17 0542 09/15/17 1143 08/03/18 0934 08/04/18 0439  CKTOTAL  --   --   --  978* 924*  TROPONINI <0.03 <0.03 <0.03  --   --    BNP: Invalid input(s): POCBNP Lab Results  Component Value Date   HGBA1C 8.0 (H) 08/03/2018   Lab Results  Component Value Date   TSH 0.562 08/03/2018   Lab Results  Component Value Date   VITAMINB12 242 08/03/2018   Lab Results  Component Value Date   FOLATE 7.6 06/01/2011   Lab Results  Component Value Date   IRON 41 (L) 11/18/2016   TIBC 298 06/01/2011   FERRITIN 482 (H) 11/18/2016    Imaging and Procedures obtained prior to SNF admission: No results  found.  Assessment/Plan  Fall Patient is going to start therapy.  Non-traumatic rhabdomyolysis Due to fall and prolonged immobilization  Diabetes mellitus,  He is on Lantus and glipizide Continue to monitor her Accu-Cheks  Essential hypertension Blood pressure controlled on lovastatin Lasix was discontinued due to some concern of dehydration We will continue to follow his weight closely.  CKD (chronic kidney disease) stage 3, GFR 30-59 ml/min (HCC) Creatinine back to his baseline Follow-up    Urinary incontinence On trospium and Flomax  Alzheimer's disease  Patient on Aricept and Zoloft He is functional. His son plans to take him home      Family/ staff Communication:   Labs/tests ordered:  Total time spent in this patient care encounter was _45 minutes; greater than 50% of the visit spent counseling patient, reviewing records , Labs and coordinating care for problems addressed at this encounter.

## 2018-08-11 ENCOUNTER — Other Ambulatory Visit: Payer: Self-pay

## 2018-08-11 ENCOUNTER — Encounter: Payer: Self-pay | Admitting: Internal Medicine

## 2018-08-11 ENCOUNTER — Emergency Department (HOSPITAL_COMMUNITY)
Admission: EM | Admit: 2018-08-11 | Discharge: 2018-08-11 | Disposition: A | Discharge status: Home / Self Care | Payer: Medicare HMO | Attending: Emergency Medicine | Admitting: Emergency Medicine

## 2018-08-11 ENCOUNTER — Emergency Department (HOSPITAL_COMMUNITY): Payer: Medicare HMO

## 2018-08-11 ENCOUNTER — Inpatient Hospital Stay
Admission: RE | Admit: 2018-08-11 | Discharge: 2018-08-21 | Disposition: A | Discharge status: Home / Self Care | Payer: Medicare HMO | Source: Ambulatory Visit | Attending: Internal Medicine | Admitting: Internal Medicine

## 2018-08-11 ENCOUNTER — Encounter (HOSPITAL_COMMUNITY): Payer: Self-pay | Admitting: Emergency Medicine

## 2018-08-11 ENCOUNTER — Non-Acute Institutional Stay (SKILLED_NURSING_FACILITY): Payer: Medicare HMO | Admitting: Internal Medicine

## 2018-08-11 DIAGNOSIS — IMO0001 Reserved for inherently not codable concepts without codable children: Secondary | ICD-10-CM

## 2018-08-11 DIAGNOSIS — E1122 Type 2 diabetes mellitus with diabetic chronic kidney disease: Secondary | ICD-10-CM | POA: Diagnosis not present

## 2018-08-11 DIAGNOSIS — R05 Cough: Secondary | ICD-10-CM | POA: Diagnosis not present

## 2018-08-11 DIAGNOSIS — R4182 Altered mental status, unspecified: Secondary | ICD-10-CM

## 2018-08-11 DIAGNOSIS — R0602 Shortness of breath: Secondary | ICD-10-CM | POA: Diagnosis not present

## 2018-08-11 DIAGNOSIS — E119 Type 2 diabetes mellitus without complications: Secondary | ICD-10-CM

## 2018-08-11 DIAGNOSIS — Z79899 Other long term (current) drug therapy: Secondary | ICD-10-CM | POA: Insufficient documentation

## 2018-08-11 DIAGNOSIS — F419 Anxiety disorder, unspecified: Secondary | ICD-10-CM | POA: Diagnosis not present

## 2018-08-11 DIAGNOSIS — N183 Chronic kidney disease, stage 3 unspecified: Secondary | ICD-10-CM

## 2018-08-11 DIAGNOSIS — G308 Other Alzheimer's disease: Secondary | ICD-10-CM | POA: Diagnosis not present

## 2018-08-11 DIAGNOSIS — Z96651 Presence of right artificial knee joint: Secondary | ICD-10-CM | POA: Diagnosis not present

## 2018-08-11 DIAGNOSIS — F0280 Dementia in other diseases classified elsewhere without behavioral disturbance: Secondary | ICD-10-CM

## 2018-08-11 DIAGNOSIS — R059 Cough, unspecified: Secondary | ICD-10-CM

## 2018-08-11 DIAGNOSIS — I129 Hypertensive chronic kidney disease with stage 1 through stage 4 chronic kidney disease, or unspecified chronic kidney disease: Secondary | ICD-10-CM | POA: Diagnosis not present

## 2018-08-11 DIAGNOSIS — Z794 Long term (current) use of insulin: Secondary | ICD-10-CM

## 2018-08-11 DIAGNOSIS — F0390 Unspecified dementia without behavioral disturbance: Secondary | ICD-10-CM | POA: Diagnosis not present

## 2018-08-11 DIAGNOSIS — R0902 Hypoxemia: Secondary | ICD-10-CM

## 2018-08-11 LAB — BASIC METABOLIC PANEL
Anion gap: 7 (ref 5–15)
BUN: 19 mg/dL (ref 8–23)
CO2: 26 mmol/L (ref 22–32)
Calcium: 8.2 mg/dL — ABNORMAL LOW (ref 8.9–10.3)
Chloride: 106 mmol/L (ref 98–111)
Creatinine, Ser: 1.68 mg/dL — ABNORMAL HIGH (ref 0.61–1.24)
GFR calc Af Amer: 41 mL/min — ABNORMAL LOW (ref >60)
GFR calc non Af Amer: 35 mL/min — ABNORMAL LOW (ref >60)
Glucose, Bld: 119 mg/dL — ABNORMAL HIGH (ref 70–99)
Potassium: 4.4 mmol/L (ref 3.5–5.1)
Sodium: 139 mmol/L (ref 135–145)

## 2018-08-11 LAB — CBC WITH DIFFERENTIAL/PLATELET
Basophils Absolute: 0 10*3/uL (ref 0.0–0.1)
Basophils Relative: 0 %
Eosinophils Absolute: 0.1 10*3/uL (ref 0.0–0.7)
Eosinophils Relative: 4 %
HCT: 32.7 % — ABNORMAL LOW (ref 39.0–52.0)
Hemoglobin: 10.9 g/dL — ABNORMAL LOW (ref 13.0–17.0)
Lymphocytes Relative: 43 %
Lymphs Abs: 1.6 10*3/uL (ref 0.7–4.0)
MCH: 31.1 pg (ref 26.0–34.0)
MCHC: 33.3 g/dL (ref 30.0–36.0)
MCV: 93.2 fL (ref 78.0–100.0)
Monocytes Absolute: 0.4 10*3/uL (ref 0.1–1.0)
Monocytes Relative: 11 %
Neutro Abs: 1.5 10*3/uL — ABNORMAL LOW (ref 1.7–7.7)
Neutrophils Relative %: 42 %
Platelets: 210 10*3/uL (ref 150–400)
RBC: 3.51 MIL/uL — ABNORMAL LOW (ref 4.22–5.81)
RDW: 14.1 % (ref 11.5–15.5)
WBC: 3.7 10*3/uL — ABNORMAL LOW (ref 4.0–10.5)

## 2018-08-11 LAB — TROPONIN I: Troponin I: <0.03 ng/mL (ref <0.03)

## 2018-08-11 LAB — BRAIN NATRIURETIC PEPTIDE: B Natriuretic Peptide: 68 pg/mL (ref 0.0–100.0)

## 2018-08-11 LAB — MAGNESIUM: Magnesium: 2.1 mg/dL (ref 1.7–2.4)

## 2018-08-11 NOTE — ED Triage Notes (Signed)
EMS called to [facility] for sob and decreased loc.  Pt was awake and alert upon ems arrival.  VS wnl and no acute distress upon arrival to ED

## 2018-08-11 NOTE — Progress Notes (Signed)
Location:    [place] Room Number: 136/P Place of Service:  SNF [phone]) Provider:  Tera Helper, MD  Patient Care Team: [name], MD as PCP - General (Family Medicine) Alyson Ingles [name], MD as Consulting Physician (Urology)  Extended Emergency Contact Information Primary Emergency Contact: [facility] Address: [place], [place] [address] [place] Phone: [phone] Relation: Son Secondary Emergency Contact: Zielinski,Curtis  [place] Phone: [phone] Mobile Phone: [phone] Relation: Son  Code Status:  DNR Goals of care: Advanced Directive information Advanced Directives 08/11/2018  Does Patient Have a Medical Advance Directive? Yes  Type of Advance Directive Out of facility DNR (pink MOST or yellow form)  Does patient want to make changes to medical advance directive? No - Patient declined  Copy of [place] in Chart? No - copy requested  Would patient like information on creating a medical advance directive? -  Pre-existing out of facility DNR order (yellow form or pink MOST form) -     Chief Complaint  Patient presents with  . Acute Visit    Patient is being seen for f/u ED visit    HPI:  Pt is a 84 y.o. male seen today for an acute visit for follow-up of altered level consciousness lethargy with apparently shortness of breath.  He is a recent admit to facility-he is receiving therapy after staying in the hospital for a fall with weakness.  Patient has H/o diabetes on insulin hypertension, hyperlipidemia, BPH with Urinary Incontinence.   Patient was initially admitted to hospital was found in the floor at home his CPK level was elevated at over 900 creatinine was mildly elevated above baseline at just over 2.  Work-up in the hospital was negative really for any acute process and skilled nursing was recommended for  strengthening.  He has been doing well in facility.  However this morning apparently he could not be aroused and was found to be mildly hypoxic apparently saturation did come up on oxygen- he was sent to the ER for evaluation.  Apparently when he arrived in the ER he was much more alert and relatively at his baseline.  Work-up in the ER was fairly benign troponin was not elevated BNP was only 68- renal function appears stable with a creatinine of 1.68.  Chest x-ray was negative for any acute process- EKG did not show any acute concerning findings.  He apparently was breathing without difficulty oxygen saturations were satisfactory and he was confused but alert and relatively at baseline.  I saw him later this afternoon after he arrived back from the ER and he appeared to be doing well again has some baseline dementia confusion but appeared to be doing fairly well and essentially at his baseline he was certainly alert pleasant  Past Medical History:  Diagnosis Date  . Acute cholecystitis 11/2012   Drained percutaneously  . Anxiety   . BPH (benign prostatic hypertrophy)   . Deep venous thrombosis (HCC)    Left leg, diagnosed 7/12  . Dementia    Needs supervision for safety per son - POA  . Diverticulitis   . Essential hypertension, benign   . GERD (gastroesophageal reflux disease)   . History of pneumonia    Prior to 2012  . Incontinence of urine   . Mixed hyperlipidemia   . Osteoarthritis   . Pulmonary embolism ([place])  Diagnosed 7/12  . Type 2 diabetes mellitus ([place])    Past Surgical History:  Procedure Laterality Date  . CARPAL TUNNEL RELEASE    . COLONOSCOPY  08/31/2012   Procedure: COLONOSCOPY;  Surgeon: Rogene Houston, MD;  Location: [facility];  Service: Endoscopy;  Laterality: N/A;  830  . CYSTOSCOPY WITH INJECTION N/A 05/15/2018   Procedure: CYSTOSCOPY WITH BOTOX INJECTION;  Surgeon: Cleon Gustin, MD;  Location: [facility];  Service: Urology;  Laterality: N/A;    . CYSTOSCOPY WITH INSERTION OF UROLIFT N/A 09/02/2016   Procedure: CYSTOSCOPY WITH INSERTION OF UROLIFT;  Surgeon: Cleon Gustin, MD;  Location: [facility];  Service: Urology;  Laterality: N/A;  . EYE SURGERY Bilateral    bilateral cataract  . Right total knee replacement  04/23/2011  . SPINE SURGERY  prior to 2012   Neck fusion  . UMBILICAL HERNIA REPAIR     umbilical    Allergies  Allergen Reactions  . Zosyn [Piperacillin Sod-Tazobactam So] Anaphylaxis, Swelling and Other (See Comments)    Facial swelling Has patient had a PCN reaction causing immediate rash, facial/tongue/throat swelling, SOB or lightheadedness with hypotension: Yes Has patient had a PCN reaction causing severe rash involving mucus membranes or skin necrosis: No Has patient had a PCN reaction that required hospitalization: No Has patient had a PCN reaction occurring within the last 10 years: Yes If all of the above answers are "NO", then may proceed with Cephalosporin use.   . Ace Inhibitors Cough  . Vancomycin Swelling and Other (See Comments)    Facial swelling    Outpatient Encounter Medications as of 08/11/2018  Medication Sig  . acetaminophen (TYLENOL) 500 MG tablet Take 1,000 mg by mouth every 6 (six) hours as needed for mild pain, moderate pain or headache.   . Carboxymeth-Glycerin-Polysorb 0.5-1-0.5 % SOLN Place 1 drop into both eyes 4 (four) times daily as needed.  . donepezil (ARICEPT) 10 MG tablet TAKE 1 TABLET AT BEDTIME  . FEROSUL 325 (65 Fe) MG tablet TAKE 1 TABLET TWICE DAILY  . glipiZIDE (GLUCOTROL XL) 10 MG 24 hr tablet Take 1 tablet (10 mg total) by mouth daily with breakfast.  . glucose blood (TRUE METRIX BLOOD GLUCOSE TEST) test strip TEST ONCE DAILY dx e11.9  . Insulin Glargine (LANTUS SOLOSTAR) 100 UNIT/ML Solostar Pen Inject 20 Units into the skin daily. HOLD for blood sugar levels below 100  . Insulin Pen Needle (PEN NEEDLES) 31G X 5 MM MISC 1 each by Does not apply  route as directed. Use to inject lantus daily dx E11.65  . loratadine (CLARITIN) 10 MG tablet Take 10 mg by mouth every morning.   . lovastatin (MEVACOR) 40 MG tablet TAKE 1 TABLET AT BEDTIME  . omeprazole (PRILOSEC) 40 MG capsule TAKE 1 CAPSULE EVERY DAY  . sertraline (ZOLOFT) 50 MG tablet TAKE 1 TABLET EVERY DAY  . tamsulosin (FLOMAX) 0.4 MG CAPS capsule TAKE 1 CAPSULE EVERY DAY  . Trospium Chloride 60 MG CP24 Take 60 mg by mouth daily.   . Vitamin D, Ergocalciferol, (DRISDOL) 50000 units CAPS capsule TAKE 1 CAPSULE EVERY 7 (SEVEN) DAYS.  . [DISCONTINUED] Carboxymethylcellul-Glycerin 0.5-0.9 % SOLN Place 1 drop into both eyes 4 (four) times daily as needed (for dry eyes).    No facility-administered encounter medications on file as of 08/11/2018.     Review of Systems   This is limited secondary to patient being a poor historian.  General is not complaining of fever  chills.  Skin is not complaining of any rashes or diaphoresis or itching.  Head ears eyes nose mouth and throat not complaining of any visual changes or difficulty swallowing.  Respiratory is not complaining of shortness of breath or cough.  Cardiac is not complaining of chest pain.  GI does not really complain of any abdominal discomfort nausea vomiting diarrhea at times has complained of constipation but is not complaining of that this afternoon.  GU is not complaining of dysuria.  Musculoskeletal continues with some weakness but is not complaining of joint pain.  Neurologic does not complain of being dizzy or feeling syncopal or having a headache.  Psych continues to be pleasant confused but appropriate.  He is not really complaining of being depressed or anxious    Immunization History  Administered Date(s) Administered  . Influenza Split 10/14/2011, 09/26/2012, 09/29/2013  . Influenza Whole 09/07/2007, 09/04/2009, 08/04/2010  . Influenza,inj,Quad PF,6+ Mos 09/09/2014, 08/13/2016, 10/05/2017  .  Pneumococcal Conjugate-13 07/16/2014  . Pneumococcal Polysaccharide-23 08/04/2010  . Td 07/28/2004  . Zoster 03/08/2007   Pertinent  Health Maintenance Due  Topic Date Due  . INFLUENZA VACCINE  09/08/2018 (Originally 06/29/2018)  . OPHTHALMOLOGY EXAM  09/08/2018 (Originally 09/21/2016)  . URINE MICROALBUMIN  09/08/2018 (Originally 02/24/2018)  . FOOT EXAM  01/28/2019  . HEMOGLOBIN A1C  02/01/2019  . PNA vac Low Risk Adult  Completed   Fall Risk  06/06/2018 06/21/2017 12/01/2016 11/27/2015 04/22/2015  Falls in the past year? No No No No No  Risk for fall due to : - - - - Impaired balance/gait   Functional Status Survey:    Vitals:   08/11/18 1604  BP: 136/73  Pulse: 63  Resp: 20  Temp: 98.9 F (37.2 C)  TempSrc: Oral    Physical Exam   In general this is a pleasant elderly male in no distress.  His skin is warm and dry.  Eyes visual acuity appears to be intact sclera and conjunctive are clear.  Oropharynx is clear mucous membranes moist.  Chest is clear to auscultation he is not having any labored breathing.  Heart is regular rate and rhythm with baseline lower extremity edema heart sounds are somewhat distant.  His abdomen soft nontender it is obese has positive bowel sounds.  Muscular skeletal does move all extremities x4 it appears at baseline with some continued lower extremity weakness.  Neurologic appears to be alert I could not really appreciate lateralizing findings his speech is clear.  Psych he is pleasant oriented to self-answer simple questions appropriately- he does follow commands without difficulty.    Labs reviewed: Recent Labs    08/09/18 1620 08/10/18 0703 08/11/18 0912  NA 133* 137 139  K 4.0 4.2 4.4  CL 102 104 106  CO2 24 25 26   GLUCOSE 182* 122* 119*  BUN 22 22 19   CREATININE 1.73* 1.70* 1.68*  CALCIUM 8.1* 8.2* 8.2*  MG  --   --  2.1   Recent Labs    08/03/18 0934 08/09/18 1620 08/10/18 0703  AST 34 21 19  ALT 18 16 17     ALKPHOS 63 58 54  BILITOT 1.4* 0.5 0.7  PROT 8.0 6.9 6.9  ALBUMIN 3.7 2.9* 2.9*   Recent Labs    08/09/18 1620 08/10/18 0704 08/11/18 0912  WBC 5.0 5.0 3.7*  NEUTROABS 3.1 2.6 1.5*  HGB 11.6* 11.1* 10.9*  HCT 34.6* 33.7* 32.7*  MCV 92.3 93.1 93.2  PLT 184 202 210   Lab Results  Component  Value Date   TSH 0.562 08/03/2018   Lab Results  Component Value Date   HGBA1C 8.0 (H) 08/03/2018   Lab Results  Component Value Date   CHOL 140 05/29/2018   HDL 39 (L) 05/29/2018   LDLCALC 85 05/29/2018   TRIG 74 05/29/2018   CHOLHDL 3.6 05/29/2018    Significant Diagnostic Results in last 30 days:  Dg Chest 2 View  Result Date: 08/11/2018 CLINICAL DATA:  Shortness of breath and cough EXAM: CHEST - 2 VIEW COMPARISON:  Chest radiograph August 03, 2018 and chest CT August 09, 2018 FINDINGS: There is persistent patchy bibasilar atelectasis. There is no frank edema or consolidation. Heart is upper normal in size with pulmonary vascularity normal. No adenopathy. There is aortic atherosclerosis. No bone lesions. IMPRESSION: Bibasilar atelectatic change. No frank edema or consolidation. Stable cardiac silhouette. There is aortic atherosclerosis. Aortic Atherosclerosis (ICD10-I70.0). Electronically Signed   By: Lowella Grip III M.D.   On: 08/11/2018 09:38   Dg Chest 2 View  Result Date: 08/03/2018 CLINICAL DATA:  Acute pain following fall last night. EXAM: CHEST - 2 VIEW COMPARISON:  08/01/2018 and prior exams FINDINGS: Cardiomegaly again noted. Mild bibasilar atelectasis/scarring again identified. There is no evidence of airspace disease, pleural effusion or pneumothorax. No acute bony abnormalities are identified. IMPRESSION: Cardiomegaly without evidence of acute cardiopulmonary disease. Mild bibasilar atelectasis/scarring. Electronically Signed   By: Margarette Canada M.D.   On: 08/03/2018 10:50   Dg Chest 2 View  Result Date: 08/02/2018 CLINICAL DATA:  Cough and congestion EXAM: CHEST -  2 VIEW COMPARISON:  December 12, 2017 and September 14, 2017 FINDINGS: There is scarring and fibrosis in the mid and lower lung zones, most severe in the bases, stable. There is no frank edema or consolidation. Heart is borderline enlarged, stable, with pulmonary vascularity normal. No adenopathy. There is degenerative change in the thoracic spine. IMPRESSION: Fibrosis and scarring in the mid and lower lung zones, most severe in the bases, stable. No edema or consolidation. Stable cardiac prominence. No adenopathy evident. Electronically Signed   By: Lowella Grip III M.D.   On: 08/02/2018 08:42   Dg Pelvis 1-2 Views  Result Date: 08/03/2018 CLINICAL DATA:  Acute pelvic pain following fall last night. Initial encounter. EXAM: PELVIS - 1-2 VIEW COMPARISON:  09/18/2017 CT FINDINGS: There is no evidence of pelvic fracture or diastasis. No pelvic bone lesions are seen. IMPRESSION: Negative. Electronically Signed   By: Margarette Canada M.D.   On: 08/03/2018 10:54   Ct Head Wo Contrast  Result Date: 08/03/2018 CLINICAL DATA:  Altered level of consciousness.  Fall today. EXAM: CT HEAD WITHOUT CONTRAST CT CERVICAL SPINE WITHOUT CONTRAST TECHNIQUE: Multidetector CT imaging of the head and cervical spine was performed following the standard protocol without intravenous contrast. Multiplanar CT image reconstructions of the cervical spine were also generated. COMPARISON:  CT head 02/21/2017 FINDINGS: CT HEAD FINDINGS Brain: Moderate atrophy. Moderate chronic microvascular ischemic change. Negative for acute infarct. Negative for acute hemorrhage or mass. No midline shift. Extensive dural ossification anteriorly the appears unchanged. Vascular: Negative for hyperdense vessel Skull: Negative Sinuses/Orbits: Negative Other: None CT CERVICAL SPINE FINDINGS Alignment: Straightening of the cervical lordosis with mild kyphosis at C3-4. Normal alignment. Skull base and vertebrae: Negative for fracture Soft tissues and spinal canal: 3  cm mass left lobe of the thyroid, incompletely evaluated on this study. Recommend thyroid ultrasound. Atherosclerotic calcification carotid artery. No enlarged lymph nodes in the neck. Disc levels: ACDF with anterior plate  and screws C4 through C6. Disc degeneration and spurring at C2-3, C3-4, and C6-7. Disc and facet degeneration at C7-T1 and T1-2. Upper chest: Negative Other: None IMPRESSION: 1. Atrophy and chronic microvascular ischemia. No acute intracranial abnormality 2. ACDF C4 through C6. Negative for fracture. Cervical spondylosis as above 3. 3 cm mass left lobe of thyroid incompletely evaluated on this study. Recommend thyroid ultrasound. Electronically Signed   By: Franchot Gallo M.D.   On: 08/03/2018 11:20   Ct Angio Chest Pe W And/or Wo Contrast  Result Date: 08/09/2018 CLINICAL DATA:  Elevated D-dimer, dementia, essential benign hypertension, GERD, type II diabetes mellitus history of pulmonary embolism EXAM: CT ANGIOGRAPHY CHEST WITH CONTRAST TECHNIQUE: Multidetector CT imaging of the chest was performed using the standard protocol during bolus administration of intravenous contrast. Multiplanar CT image reconstructions and MIPs were obtained to evaluate the vascular anatomy. CONTRAST:  2mL ISOVUE-370 IOPAMIDOL (ISOVUE-370) INJECTION 76% IV [id number] COMPARISON:  CT chest 05/09/2014, CTA chest 12/01/2012 FINDINGS: Cardiovascular: Atherosclerotic calcifications aorta and proximal great vessels. Minimal aneurysmal dilatation of the ascending thoracic aorta 4.0 cm transverse image 28. Pulmonary arteries adequately opacified and patent. No evidence of pulmonary embolism. No pericardial effusion. Heart appears enlarged. Mediastinum/Nodes: BILATERAL thyroid lobe enlargement LEFT greater than RIGHT. Esophagus unremarkable. No adenopathy. Lungs/Pleura: Bibasilar atelectasis. No infiltrate, pleural effusion or pneumothorax. Upper Abdomen: No acute abnormalities. Musculoskeletal: Degenerative disc disease  changes thoracic spine. Review of the MIP images confirms the above findings. IMPRESSION: No evidence of pulmonary embolism. Aneurysmal dilatation ascending thoracic aorta 4.0 cm transverse, recommendation below. Recommend annual imaging followup by CTA or MRA. This recommendation follows 2010 ACCF/AHA/AATS/ACR/ASA/SCA/SCAI/SIR/STS/SVM Guidelines for the Diagnosis and Management of Patients with Thoracic Aortic Disease. Circulation. 2010; 121: [id number] Aortic Atherosclerosis (ICD10-I70.0). Aortic aneurysm NOS (ICD10-I71.9). Electronically Signed   By: Lavonia Dana M.D.   On: 08/09/2018 22:23   Ct Cervical Spine Wo Contrast  Result Date: 08/03/2018 CLINICAL DATA:  Altered level of consciousness.  Fall today. EXAM: CT HEAD WITHOUT CONTRAST CT CERVICAL SPINE WITHOUT CONTRAST TECHNIQUE: Multidetector CT imaging of the head and cervical spine was performed following the standard protocol without intravenous contrast. Multiplanar CT image reconstructions of the cervical spine were also generated. COMPARISON:  CT head 02/21/2017 FINDINGS: CT HEAD FINDINGS Brain: Moderate atrophy. Moderate chronic microvascular ischemic change. Negative for acute infarct. Negative for acute hemorrhage or mass. No midline shift. Extensive dural ossification anteriorly the appears unchanged. Vascular: Negative for hyperdense vessel Skull: Negative Sinuses/Orbits: Negative Other: None CT CERVICAL SPINE FINDINGS Alignment: Straightening of the cervical lordosis with mild kyphosis at C3-4. Normal alignment. Skull base and vertebrae: Negative for fracture Soft tissues and spinal canal: 3 cm mass left lobe of the thyroid, incompletely evaluated on this study. Recommend thyroid ultrasound. Atherosclerotic calcification carotid artery. No enlarged lymph nodes in the neck. Disc levels: ACDF with anterior plate and screws C4 through C6. Disc degeneration and spurring at C2-3, C3-4, and C6-7. Disc and facet degeneration at C7-T1 and T1-2. Upper  chest: Negative Other: None IMPRESSION: 1. Atrophy and chronic microvascular ischemia. No acute intracranial abnormality 2. ACDF C4 through C6. Negative for fracture. Cervical spondylosis as above 3. 3 cm mass left lobe of thyroid incompletely evaluated on this study. Recommend thyroid ultrasound. Electronically Signed   By: Franchot Gallo M.D.   On: 08/03/2018 11:20   Dg Knee Complete 4 Views Right  Result Date: 08/03/2018 CLINICAL DATA:  [place]. Rt knee pain EXAM: RIGHT KNEE - COMPLETE 4+ VIEW COMPARISON:  07/27/2010 FINDINGS: Patient  has undergone interval knee arthroplasty. The hardware is well-seated. No evidence for acute fracture or subluxation. Trace joint effusion is present. There is popliteal artery calcification. IMPRESSION: 1. Postoperative changes. 2. Trace joint effusion. 3.  No evidence for acute  abnormality. Electronically Signed   By: Nolon Nations M.D.   On: 08/03/2018 13:36    Assessment/Plan  #1- history of altered level of consciousness with hypoxia-again work-up in ER was quite benign no longer complains of any shortness of breath chest x-ray was negative for acute process- labs were unremarkable and relatively baseline.  2.  Chronic kidney disease stage III-this appears stable with a creatinine of 1.68 on lab done in the ER he does have updated labs scheduled for early next week.  3.  Dementia he continues on Aricept as well as Zoloft for coexistent depression he appears to be at his baseline mentally on exam this afternoon.  4.  Diabetes type 2 this appears stable blood sugar this morning was 132 last night was 174- he is on Lantus and glipizide.  [id number]

## 2018-08-11 NOTE — Telephone Encounter (Signed)
I attempted to call son but I see that patient is currently at the ER- taken by EMS

## 2018-08-11 NOTE — ED Provider Notes (Signed)
[facility] EMERGENCY DEPARTMENT Provider Note   CSN: [id number] Arrival date & time: 08/11/18  5176     History   Chief Complaint Chief Complaint  Patient presents with  . Shortness of Breath    HPI DAMIEN BATTY is a 84 y.o. male.  HPI   84 year old male sent from the pain center for evaluation.  Apparently he seemed short of breath and was drowsier than typical for him this morning.  He was awake and alert.  Patient had no acute complaints.  Says he has been coughing maybe a little bit but is nonproductive he does not feel short of breath.  Denies any acute pain.  Denies any fevers.  No acute respiratory complaints.  Headaches.  Denies any acute visual changes, numbness, tingling or focal loss of strength.  Past Medical History:  Diagnosis Date  . Acute cholecystitis 11/2012   Drained percutaneously  . Anxiety   . BPH (benign prostatic hypertrophy)   . Deep venous thrombosis (HCC)    Left leg, diagnosed 7/12  . Dementia    Needs supervision for safety per son - POA  . Diverticulitis   . Essential hypertension, benign   . GERD (gastroesophageal reflux disease)   . History of pneumonia    Prior to 2012  . Incontinence of urine   . Mixed hyperlipidemia   . Osteoarthritis   . Pulmonary embolism ([place])    Diagnosed 7/12  . Type 2 diabetes mellitus [facility])     Patient Active Problem List   Diagnosis Date Noted  . Fall 08/10/2018  . Rhabdomyolysis 08/03/2018  . New onset of confusion 08/01/2018  . Morbid obesity ([place]) 06/12/2018  . Anemia 09/14/2017  . CKD (chronic kidney disease) stage 3, GFR 30-59 ml/min (HCC) 09/14/2017  . Atrial fibrillation ([place]) 09/14/2017  . Hypoalbuminemia 11/23/2016  . Hypoxia 10/29/2016  . Urinary frequency 09/17/2015  . At high risk for falls 03/30/2015  . Generalized osteoarthritis 07/16/2014  . Urinary incontinence 07/16/2014  . Pulmonary interstitial fibrosis ([place]) 07/03/2014  . Abnormal CXR (chest x-ray) 05/27/2014  . Thyroid  mass of unclear etiology 05/07/2014  . Vitamin D deficiency 01/31/2014  . Allergic rhinitis 01/31/2014  . Diverticulosis of sigmoid colon 12/13/2012  . Right ventricular dysfunction 07/20/2011  . Heart disease, unspecified 07/20/2011  . Alzheimer's dementia without behavioral disturbance 09/12/2010  . Goiter 06/19/2009  . Essential hypertension 12/11/2008  . Diabetes mellitus, insulin dependent (IDDM), controlled ([place]) 09/05/2008  . Hyperlipidemia with target LDL less than 100 07/19/2008  . BPH (benign prostatic hyperplasia) 03/28/2008    Past Surgical History:  Procedure Laterality Date  . CARPAL TUNNEL RELEASE    . COLONOSCOPY  08/31/2012   Procedure: COLONOSCOPY;  Surgeon: Rogene Houston, MD;  Location: [facility];  Service: Endoscopy;  Laterality: N/A;  830  . CYSTOSCOPY WITH INJECTION N/A 05/15/2018   Procedure: CYSTOSCOPY WITH BOTOX INJECTION;  Surgeon: Cleon Gustin, MD;  Location: [facility] ORS;  Service: Urology;  Laterality: N/A;  . CYSTOSCOPY WITH INSERTION OF UROLIFT N/A 09/02/2016   Procedure: CYSTOSCOPY WITH INSERTION OF UROLIFT;  Surgeon: Cleon Gustin, MD;  Location: [facility];  Service: Urology;  Laterality: N/A;  . EYE SURGERY Bilateral    bilateral cataract  . Right total knee replacement  04/23/2011  . SPINE SURGERY  prior to 2012   Neck fusion  . UMBILICAL HERNIA REPAIR     umbilical        Home Medications  Prior to Admission medications   Medication Sig Start Date End Date Taking? Authorizing Provider  acetaminophen (TYLENOL) 500 MG tablet Take 1,000 mg by mouth every 6 (six) hours as needed for mild pain, moderate pain or headache.     [provider]  Carboxymethylcellul-Glycerin 0.5-0.9 % SOLN Place 1 drop into both eyes 4 (four) times daily as needed (for dry eyes).     [provider]  donepezil (ARICEPT) 10 MG tablet TAKE 1 TABLET AT BEDTIME 03/27/18   [name], MD  FEROSUL 325 (65 Fe) MG tablet  TAKE 1 TABLET TWICE DAILY 03/27/18   [name], MD  glipiZIDE (GLUCOTROL XL) 10 MG 24 hr tablet Take 1 tablet (10 mg total) by mouth daily with breakfast. 06/06/18   [name], MD  glucose blood (TRUE METRIX BLOOD GLUCOSE TEST) test strip TEST ONCE DAILY dx e11.9 06/07/18   [name], MD  Insulin Glargine (LANTUS SOLOSTAR) 100 UNIT/ML Solostar Pen Inject 20 Units into the skin daily. HOLD for blood sugar levels below 100 06/06/18   [name], MD  Insulin Pen Needle (PEN NEEDLES) 31G X 5 MM MISC 1 each by Does not apply route as directed. Use to inject lantus daily dx E11.65 02/14/18   [name], MD  loratadine (CLARITIN) 10 MG tablet Take 10 mg by mouth every morning.     [provider]  lovastatin (MEVACOR) 40 MG tablet TAKE 1 TABLET AT BEDTIME 03/27/18   [name], MD  omeprazole (PRILOSEC) 40 MG capsule TAKE 1 CAPSULE EVERY DAY 03/27/18   [name], MD  sertraline (ZOLOFT) 50 MG tablet TAKE 1 TABLET EVERY DAY 03/27/18   [name], MD  tamsulosin (FLOMAX) 0.4 MG CAPS capsule TAKE 1 CAPSULE EVERY DAY 03/27/18   [name], MD  Trospium Chloride 60 MG CP24 Take 60 mg by mouth daily.  06/02/17   [provider]  Vitamin D, Ergocalciferol, (DRISDOL) 50000 units CAPS capsule TAKE 1 CAPSULE EVERY 7 (SEVEN) DAYS. 01/30/18   [name], MD    Family History Family History  Problem Relation Age of Onset  . Cancer Mother        Stomach  . Diabetes Sister   . Hypertension Sister   . Diabetes Brother   . Cancer Brother        Gallbladder  . Seizures Son        due to meningitis as a child    Social History Social History   Tobacco Use  . Smoking status: Never Smoker  . Smokeless tobacco: Never Used  Substance Use Topics  . Alcohol use: No  . Drug use: No     Allergies   Zosyn [piperacillin sod-tazobactam so]; Ace inhibitors; and Vancomycin   Review of Systems Review of  Systems  All systems reviewed and negative, other than as noted in HPI.  Physical Exam Updated Vital Signs BP 134/83   Pulse 69   Temp 97.9 F (36.6 C) (Oral)   Resp 18   Ht 5\' 10"  (1.778 m)   Wt 115 kg   SpO2 97%   BMI 36.38 kg/m   Physical Exam  Constitutional: He appears well-developed and well-nourished. No distress.  HENT:  Head: Normocephalic and atraumatic.  Eyes: Conjunctivae are normal. Right eye exhibits no discharge. Left eye exhibits no discharge.  Neck: Neck supple.  Cardiovascular: Normal rate, regular rhythm and normal heart sounds. Exam reveals no gallop and  no friction rub.  No murmur heard. Pulmonary/Chest: Effort normal and breath sounds normal. No respiratory distress.  Abdominal: Soft. He exhibits no distension. There is no tenderness.  Musculoskeletal: He exhibits no edema or tenderness.  Neurological: He is alert.  He is awake.  Alert.  He is somewhat slow to respond to questioning but his answers are appropriate.  He is following commands.  No focal deficits noted.  Skin: Skin is warm and dry.  Psychiatric: He has a normal mood and affect. His behavior is normal. Thought content normal.  Nursing note and vitals reviewed.    ED Treatments / Results  Labs (all labs ordered are listed, but only abnormal results are displayed) Labs Reviewed  CBC WITH DIFFERENTIAL/PLATELET - Abnormal; Notable for the following components:      Result Value   WBC 3.7 (*)    RBC 3.51 (*)    Hemoglobin 10.9 (*)    HCT 32.7 (*)    Neutro Abs 1.5 (*)    All other components within normal limits  BASIC METABOLIC PANEL - Abnormal; Notable for the following components:   Glucose, Bld 119 (*)    Creatinine, Ser 1.68 (*)    Calcium 8.2 (*)    GFR calc non Af Amer 35 (*)    GFR calc Af Amer 41 (*)    All other components within normal limits  BRAIN NATRIURETIC PEPTIDE  MAGNESIUM  TROPONIN I    EKG EKG Interpretation  Date/Time:  Friday August 11 2018 08:32:15  EDT Ventricular Rate:  68 PR Interval:    QRS Duration: 156 QT Interval:  440 QTC Calculation: 468 R Axis:   19 Text Interpretation:  Sinus rhythm Right bundle branch block Confirmed by Virgel Manifold [phone]) on 08/11/2018 9:22:03 AM   Radiology Dg Chest 2 View  Result Date: 08/11/2018 CLINICAL DATA:  Shortness of breath and cough EXAM: CHEST - 2 VIEW COMPARISON:  Chest radiograph August 03, 2018 and chest CT August 09, 2018 FINDINGS: There is persistent patchy bibasilar atelectasis. There is no frank edema or consolidation. Heart is upper normal in size with pulmonary vascularity normal. No adenopathy. There is aortic atherosclerosis. No bone lesions. IMPRESSION: Bibasilar atelectatic change. No frank edema or consolidation. Stable cardiac silhouette. There is aortic atherosclerosis. Aortic Atherosclerosis (ICD10-I70.0). Electronically Signed   By: Lowella Grip III M.D.   On: 08/11/2018 09:38   Ct Angio Chest Pe W And/or Wo Contrast  Result Date: 08/09/2018 CLINICAL DATA:  Elevated D-dimer, dementia, essential benign hypertension, GERD, type II diabetes mellitus history of pulmonary embolism EXAM: CT ANGIOGRAPHY CHEST WITH CONTRAST TECHNIQUE: Multidetector CT imaging of the chest was performed using the standard protocol during bolus administration of intravenous contrast. Multiplanar CT image reconstructions and MIPs were obtained to evaluate the vascular anatomy. CONTRAST:  29mL ISOVUE-370 IOPAMIDOL (ISOVUE-370) INJECTION 76% IV [id number] COMPARISON:  CT chest 05/09/2014, CTA chest 12/01/2012 FINDINGS: Cardiovascular: Atherosclerotic calcifications aorta and proximal great vessels. Minimal aneurysmal dilatation of the ascending thoracic aorta 4.0 cm transverse image 28. Pulmonary arteries adequately opacified and patent. No evidence of pulmonary embolism. No pericardial effusion. Heart appears enlarged. Mediastinum/Nodes: BILATERAL thyroid lobe enlargement LEFT greater than RIGHT.  Esophagus unremarkable. No adenopathy. Lungs/Pleura: Bibasilar atelectasis. No infiltrate, pleural effusion or pneumothorax. Upper Abdomen: No acute abnormalities. Musculoskeletal: Degenerative disc disease changes thoracic spine. Review of the MIP images confirms the above findings. IMPRESSION: No evidence of pulmonary embolism. Aneurysmal dilatation ascending thoracic aorta 4.0 cm transverse, recommendation below. Recommend annual imaging followup by  CTA or MRA. This recommendation follows 2010 ACCF/AHA/AATS/ACR/ASA/SCA/SCAI/SIR/STS/SVM Guidelines for the Diagnosis and Management of Patients with Thoracic Aortic Disease. Circulation. 2010; 121: P379-K240 Aortic Atherosclerosis (ICD10-I70.0). Aortic aneurysm NOS (ICD10-I71.9). Electronically Signed   By: Lavonia Dana M.D.   On: 08/09/2018 22:23    Procedures Procedures (including critical care time)  Medications Ordered in ED Medications - No data to display   Initial Impression / Assessment and Plan / ED Course  I have reviewed the triage vital signs and the nursing notes.  Pertinent labs & imaging results that were available during my care of the patient were reviewed by me and considered in my medical decision making (see chart for details).     84 year old male sent for evaluation of change in mental status.  This has appeared to resolve.  Patient himself has been calm.  He is breathing without apparent difficulty.  Oxygen saturations are fine on RA. CXR w/o significant acute process.   It has been determined that no acute conditions requiring further emergency intervention are present at this time. The patient has been advised of the diagnosis and plan. I reviewed any labs and imaging including any potential incidental findings. We have discussed signs and symptoms that warrant return to the ED and they are listed in the discharge instructions.    Final Clinical Impressions(s) / ED Diagnoses   Final diagnoses:  Cough    ED Discharge  Orders    None       Virgel Manifold, MD 08/11/18 [phone]

## 2018-08-11 NOTE — ED Notes (Signed)
[facility] to give report.  Will send someone to get patient.

## 2018-08-12 ENCOUNTER — Encounter: Payer: Self-pay | Admitting: Internal Medicine

## 2018-08-16 NOTE — Telephone Encounter (Signed)
Pt currently admitted to facility

## 2018-08-17 ENCOUNTER — Encounter: Payer: Self-pay | Admitting: Internal Medicine

## 2018-08-17 ENCOUNTER — Non-Acute Institutional Stay (SKILLED_NURSING_FACILITY): Payer: Medicare HMO | Admitting: Internal Medicine

## 2018-08-17 ENCOUNTER — Encounter (HOSPITAL_COMMUNITY)
Admission: RE | Admit: 2018-08-17 | Discharge: 2018-08-17 | Disposition: A | Discharge status: Home / Self Care | Payer: Medicare HMO | Source: Skilled Nursing Facility | Attending: Internal Medicine | Admitting: Internal Medicine

## 2018-08-17 DIAGNOSIS — E119 Type 2 diabetes mellitus without complications: Secondary | ICD-10-CM | POA: Diagnosis not present

## 2018-08-17 DIAGNOSIS — F0280 Dementia in other diseases classified elsewhere without behavioral disturbance: Secondary | ICD-10-CM

## 2018-08-17 DIAGNOSIS — R531 Weakness: Secondary | ICD-10-CM | POA: Diagnosis not present

## 2018-08-17 DIAGNOSIS — N183 Chronic kidney disease, stage 3 unspecified: Secondary | ICD-10-CM

## 2018-08-17 DIAGNOSIS — G308 Other Alzheimer's disease: Secondary | ICD-10-CM

## 2018-08-17 DIAGNOSIS — Z794 Long term (current) use of insulin: Secondary | ICD-10-CM

## 2018-08-17 DIAGNOSIS — M6282 Rhabdomyolysis: Secondary | ICD-10-CM | POA: Diagnosis not present

## 2018-08-17 DIAGNOSIS — IMO0001 Reserved for inherently not codable concepts without codable children: Secondary | ICD-10-CM

## 2018-08-17 DIAGNOSIS — F329 Major depressive disorder, single episode, unspecified: Secondary | ICD-10-CM | POA: Insufficient documentation

## 2018-08-17 DIAGNOSIS — I10 Essential (primary) hypertension: Secondary | ICD-10-CM | POA: Diagnosis not present

## 2018-08-17 DIAGNOSIS — M159 Polyosteoarthritis, unspecified: Secondary | ICD-10-CM | POA: Insufficient documentation

## 2018-08-17 DIAGNOSIS — I129 Hypertensive chronic kidney disease with stage 1 through stage 4 chronic kidney disease, or unspecified chronic kidney disease: Secondary | ICD-10-CM | POA: Insufficient documentation

## 2018-08-17 LAB — BASIC METABOLIC PANEL
Anion gap: 6 (ref 5–15)
BUN: 17 mg/dL (ref 8–23)
CO2: 27 mmol/L (ref 22–32)
CREATININE: 1.64 mg/dL — AB (ref 0.61–1.24)
Calcium: 8.5 mg/dL — ABNORMAL LOW (ref 8.9–10.3)
Chloride: 107 mmol/L (ref 98–111)
GFR calc Af Amer: 42 mL/min — ABNORMAL LOW (ref >60)
GFR calc non Af Amer: 37 mL/min — ABNORMAL LOW (ref >60)
GLUCOSE: 85 mg/dL (ref 70–99)
POTASSIUM: 4.3 mmol/L (ref 3.5–5.1)
Sodium: 140 mmol/L (ref 135–145)

## 2018-08-17 LAB — CBC
HEMATOCRIT: 34.1 % — AB (ref 39.0–52.0)
Hemoglobin: 11 g/dL — ABNORMAL LOW (ref 13.0–17.0)
MCH: 30.6 pg (ref 26.0–34.0)
MCHC: 32.3 g/dL (ref 30.0–36.0)
MCV: 94.7 fL (ref 78.0–100.0)
Platelets: 254 10*3/uL (ref 150–400)
RBC: 3.6 MIL/uL — ABNORMAL LOW (ref 4.22–5.81)
RDW: 14.4 % (ref 11.5–15.5)
WBC: 4.3 10*3/uL (ref 4.0–10.5)

## 2018-08-17 LAB — CLOSTRIDIUM DIFFICILE BY PCR, REFLEXED: CDIFFPCR: NEGATIVE

## 2018-08-17 LAB — C DIFFICILE QUICK SCREEN W PCR REFLEX
C Diff antigen: NEGATIVE
C Diff toxin: POSITIVE — AB

## 2018-08-17 NOTE — Progress Notes (Signed)
Location:    [facility] Room Number: 136/P Place of Service:  SNF [phone])  Provider: Granville Lewis PA-C  PCP: [name], MD Patient Care Team: [name], MD as PCP - General (Family Medicine) Alyson Ingles [name], MD as Consulting Physician (Urology)  Extended Emergency Contact Information Primary Emergency Contact: [facility] Address: [place], [place] [address] Johnnette Litter of [place] Phone: [phone] Relation: Son Secondary Emergency Contact: Steier,Curtis  [place] Phone: [phone] Mobile Phone: [phone] Relation: Son  Code Status: DNR Goals of care:  Advanced Directive information Advanced Directives 08/17/2018  Does Patient Have a Medical Advance Directive? Yes  Type of Advance Directive Out of facility DNR (pink MOST or yellow form)  Does patient want to make changes to medical advance directive? No - Patient declined  Copy of [place] in Chart? No - copy requested  Would patient like information on creating a medical advance directive? -  Pre-existing out of facility DNR order (yellow form or pink MOST form) -     Allergies  Allergen Reactions  . Zosyn [Piperacillin Sod-Tazobactam So] Anaphylaxis, Swelling and Other (See Comments)    Facial swelling Has patient had a PCN reaction causing immediate rash, facial/tongue/throat swelling, SOB or lightheadedness with hypotension: Yes Has patient had a PCN reaction causing severe rash involving mucus membranes or skin necrosis: No Has patient had a PCN reaction that required hospitalization: No Has patient had a PCN reaction occurring within the last 10 years: Yes If all of the above answers are "NO", then may proceed with Cephalosporin use.   . Ace Inhibitors Cough  . Vancomycin Swelling and Other (See Comments)    Facial swelling    Chief Complaint  Patient presents with  . Discharge Note   Discharge Visit    HPI:  84 y.o. male   seen today for discharge from facility later this week.  Patient was here for rehab and strengthening after being found at home after a fall.  He has a history of diabetes as well as hypertension hyperlipidemia BPH with urinary incontinence.  Lives with his son.  Recenty he fell at home while son was away-his son later found him on the floor.  It was also thought he had some increased confusion.  His CPK level was elevated at over 900 creatinine was slightly over his baseline at 2.09.  Work-up in the hospital include x-rays and CT scan they were negative for any acute process.  Labs were essentially normal except for again his creatinine was somewhat elevated CPK level was elevated.  Recommendation was made for skilled nursing and he has gained some strength here he will be going back home with his son.  When he was here he did go to the ER for a transitory ltered consciousness unresponsiveness but apparently by the time he got to the ER he was back at baseline- apparently his son has stated that at times his dad will do this that he apparently sleeps soundly.  Work-up in the ER was essentially unremarkable troponin was not elevated BNP was only 68 creatinine showed improvement at 1.68-chest x-ray was negative EKG did not show any concerning acute findings.--Appears did show some tachycardia  Stay here has been relatively unremarkable since then.  He is a type II diabetic on Lantus and glipizide blood sugars appear to be mainly in the lower mid 100s it was  77 this morning but that is not usual for him.  He does have a history of hypertension Lasix apparently was held in the hospital because of kidney concerns nonetheless his edema and weight appears to be relatively stable during his stay here blood pressure appears to be well controlled I got 130/80 manually I see previous listings usually in the 120s to 130s I see one reading that said 180s  but I suspect this was a machine variation.  Apparently at times he has been on oxygen at home I got an O2 saturation in the mid 90s apparently was in the high 80s this morning and he did get short course of oxygen- he does not describe any cough or shortness of breath nursing is not noted this either.  He also has a history of anemia continues on iron hemoglobin done today was 11.0 which appears relatively stable.  He also has a history of BPH as well as urinary incontinence and continues on trospium chloride.  Currently he is sitting in his wheelchair comfortably vital signs appear to be stable his pulse is slightly elevated tachycardic in the low 100s he is not complaining of any discomfort however    Past Medical History:  Diagnosis Date  . Acute cholecystitis 11/2012   Drained percutaneously  . Anxiety   . BPH (benign prostatic hypertrophy)   . Deep venous thrombosis (HCC)    Left leg, diagnosed 7/12  . Dementia    Needs supervision for safety per son - POA  . Diverticulitis   . Essential hypertension, benign   . GERD (gastroesophageal reflux disease)   . History of pneumonia    Prior to 2012  . Incontinence of urine   . Mixed hyperlipidemia   . Osteoarthritis   . Pulmonary embolism ([place])    Diagnosed 7/12  . Type 2 diabetes mellitus ([place])     Past Surgical History:  Procedure Laterality Date  . CARPAL TUNNEL RELEASE    . COLONOSCOPY  08/31/2012   Procedure: COLONOSCOPY;  Surgeon: Rogene Houston, MD;  Location: [facility];  Service: Endoscopy;  Laterality: N/A;  830  . CYSTOSCOPY WITH INJECTION N/A 05/15/2018   Procedure: CYSTOSCOPY WITH BOTOX INJECTION;  Surgeon: Cleon Gustin, MD;  Location: [facility];  Service: Urology;  Laterality: N/A;  . CYSTOSCOPY WITH INSERTION OF UROLIFT N/A 09/02/2016   Procedure: CYSTOSCOPY WITH INSERTION OF UROLIFT;  Surgeon: Cleon Gustin, MD;  Location: [facility];  Service: Urology;  Laterality: N/A;  . EYE  SURGERY Bilateral    bilateral cataract  . Right total knee replacement  04/23/2011  . SPINE SURGERY  prior to 2012   Neck fusion  . UMBILICAL HERNIA REPAIR     umbilical      reports that he has never smoked. He has never used smokeless tobacco. He reports that he does not drink alcohol or use drugs. Social History   Socioeconomic History  . Marital status: Widowed    Spouse name: Not on file  . Number of children: Not on file  . Years of education: Not on file  . Highest education level: Not on file  Occupational History  . Not on file  Social Needs  . Financial resource strain: Not on file  . Food insecurity:    Worry: Not on file    Inability: Not on file  . Transportation needs:    Medical: Not on file    Non-medical: Not on file  Tobacco Use  .  Smoking status: Never Smoker  . Smokeless tobacco: Never Used  Substance and Sexual Activity  . Alcohol use: No  . Drug use: No  . Sexual activity: Never  Lifestyle  . Physical activity:    Days per week: Not on file    Minutes per session: Not on file  . Stress: Not on file  Relationships  . Social connections:    Talks on phone: Not on file    Gets together: Not on file    Attends religious service: Not on file    Active member of club or organization: Not on file    Attends meetings of clubs or organizations: Not on file    Relationship status: Not on file  . Intimate partner violence:    Fear of current or ex partner: Not on file    Emotionally abused: Not on file    Physically abused: Not on file    Forced sexual activity: Not on file  Other Topics Concern  . Not on file  Social History Narrative  . Not on file   Functional Status Survey:    Allergies  Allergen Reactions  . Zosyn [Piperacillin Sod-Tazobactam So] Anaphylaxis, Swelling and Other (See Comments)    Facial swelling Has patient had a PCN reaction causing immediate rash, facial/tongue/throat swelling, SOB or lightheadedness with hypotension:  Yes Has patient had a PCN reaction causing severe rash involving mucus membranes or skin necrosis: No Has patient had a PCN reaction that required hospitalization: No Has patient had a PCN reaction occurring within the last 10 years: Yes If all of the above answers are "NO", then may proceed with Cephalosporin use.   . Ace Inhibitors Cough  . Vancomycin Swelling and Other (See Comments)    Facial swelling    Pertinent  Health Maintenance Due  Topic Date Due  . INFLUENZA VACCINE  09/08/2018 (Originally 06/29/2018)  . OPHTHALMOLOGY EXAM  09/08/2018 (Originally 09/21/2016)  . URINE MICROALBUMIN  09/08/2018 (Originally 02/24/2018)  . FOOT EXAM  01/28/2019  . HEMOGLOBIN A1C  02/01/2019  . PNA vac Low Risk Adult  Completed    Medications: Outpatient Encounter Medications as of 08/17/2018  Medication Sig  . acetaminophen (TYLENOL) 500 MG tablet Take 1,000 mg by mouth every 6 (six) hours as needed for mild pain, moderate pain or headache.   . Carboxymeth-Glycerin-Polysorb 0.5-1-0.5 % SOLN Place 1 drop into both eyes 4 (four) times daily as needed.  . donepezil (ARICEPT) 10 MG tablet TAKE 1 TABLET AT BEDTIME  . FEROSUL 325 (65 Fe) MG tablet TAKE 1 TABLET TWICE DAILY  . glipiZIDE (GLUCOTROL XL) 10 MG 24 hr tablet Take 1 tablet (10 mg total) by mouth daily with breakfast.  . glucose blood (TRUE METRIX BLOOD GLUCOSE TEST) test strip TEST ONCE DAILY dx e11.9  . Insulin Glargine (LANTUS SOLOSTAR) 100 UNIT/ML Solostar Pen Inject 20 Units into the skin daily. HOLD for blood sugar levels below 100  . Insulin Pen Needle (PEN NEEDLES) 31G X 5 MM MISC 1 each by Does not apply route as directed. Use to inject lantus daily dx E11.65  . loratadine (CLARITIN) 10 MG tablet Take 10 mg by mouth every morning.   . lovastatin (MEVACOR) 40 MG tablet TAKE 1 TABLET AT BEDTIME  . omeprazole (PRILOSEC) 40 MG capsule TAKE 1 CAPSULE EVERY DAY  . sertraline (ZOLOFT) 50 MG tablet TAKE 1 TABLET EVERY DAY  . tamsulosin  (FLOMAX) 0.4 MG CAPS capsule TAKE 1 CAPSULE EVERY DAY  . Trospium Chloride  60 MG CP24 Take 60 mg by mouth daily.   . Vitamin D, Ergocalciferol, (DRISDOL) 50000 units CAPS capsule TAKE 1 CAPSULE EVERY 7 (SEVEN) DAYS.   No facility-administered encounter medications on file as of 08/17/2018.      Review of Systems   In general is not complaining of any fever chills weight appears relatively stable appears he is gained about a couple pounds during his stay here.  Skin is not complaining of rashes itching or diaphoresis.  Head ears eyes nose mouth and throat is not complaining of any visual changes or sore throat.  Respiratory is not complaining of shortness of breath or cough  Cardiac is not complaining of chest pain or increased edema beyond baseline.  GI does not complain of abdominal discomfort nausea vomiting diarrhea constipation.  GU does have a history of urinary incontinence but is not complaining of any dysuria.  Musculoskeletal does not complain of joint pain currently.  Neurologic does not complain of dizziness headache or syncope.  And psych appears to be in good spirits does not complain of being depressed or anxious he is on Zoloft with a history of depression as well as Aricept with a history of dementia    Physical Exam   He is afebrile pulse manually was 100-respirations of 16 blood pressure taken manually was 130/80- O2 saturation was between 92 and 96% on room air   In general this is a pleasant well-nourished elderly male in no distress sitting comfortably in his wheelchair.  His skin is warm and dry.  Eyes sclera and conjunctive are clear visual acuity appears to be intact.  Oropharynx is clear mucous membranes moist.  Heart -- distant heart sounds regular rate and rhythm borderline tachycardic with mild/moderate lower extremity edema  Abdomen is soft obese nontender with positive bowel sounds.  Musculoskeletal moves all extremities x4 at  baseline.  Neurologic is grossly intact his speech is clear could not really appreciate any lateralizing findings.  Psych he is pleasant appropriate oriented to self answers straightforward questions fairly appropriately   Labs reviewed: Basic Metabolic Panel: Recent Labs    08/10/18 0703 08/11/18 0912 08/17/18 0700  NA 137 139 140  K 4.2 4.4 4.3  CL 104 106 107  CO2 25 26 27   GLUCOSE 122* 119* 85  BUN 22 19 17   CREATININE 1.70* 1.68* 1.64*  CALCIUM 8.2* 8.2* 8.5*  MG  --  2.1  --    Liver Function Tests: Recent Labs    08/03/18 0934 08/09/18 1620 08/10/18 0703  AST 34 21 19  ALT 18 16 17   ALKPHOS 63 58 54  BILITOT 1.4* 0.5 0.7  PROT 8.0 6.9 6.9  ALBUMIN 3.7 2.9* 2.9*   No results for input(s): LIPASE, AMYLASE in the last 8760 hours. No results for input(s): AMMONIA in the last 8760 hours. CBC: Recent Labs    08/09/18 1620 08/10/18 0704 08/11/18 0912 08/17/18 0700  WBC 5.0 5.0 3.7* 4.3  NEUTROABS 3.1 2.6 1.5*  --   HGB 11.6* 11.1* 10.9* 11.0*  HCT 34.6* 33.7* 32.7* 34.1*  MCV 92.3 93.1 93.2 94.7  PLT 184 202 210 254   Cardiac Enzymes: Recent Labs    09/15/17 0542 09/15/17 1143 08/03/18 0934 08/04/18 0439 08/11/18 0912  CKTOTAL  --   --  978* 924*  --   TROPONINI <0.03 <0.03  --   --  <0.03   BNP: Invalid input(s): POCBNP CBG: Recent Labs    08/08/18 0814 08/08/18 1152 08/08/18 1611  GLUCAP 84 121* 107*    Procedures and Imaging Studies During Stay: Dg Chest 2 View  Result Date: 08/11/2018 CLINICAL DATA:  Shortness of breath and cough EXAM: CHEST - 2 VIEW COMPARISON:  Chest radiograph August 03, 2018 and chest CT August 09, 2018 FINDINGS: There is persistent patchy bibasilar atelectasis. There is no frank edema or consolidation. Heart is upper normal in size with pulmonary vascularity normal. No adenopathy. There is aortic atherosclerosis. No bone lesions. IMPRESSION: Bibasilar atelectatic change. No frank edema or consolidation.  Stable cardiac silhouette. There is aortic atherosclerosis. Aortic Atherosclerosis (ICD10-I70.0). Electronically Signed   By: Lowella Grip III M.D.   On: 08/11/2018 09:38   Dg Chest 2 View  Result Date: 08/03/2018 CLINICAL DATA:  Acute pain following fall last night. EXAM: CHEST - 2 VIEW COMPARISON:  08/01/2018 and prior exams FINDINGS: Cardiomegaly again noted. Mild bibasilar atelectasis/scarring again identified. There is no evidence of airspace disease, pleural effusion or pneumothorax. No acute bony abnormalities are identified. IMPRESSION: Cardiomegaly without evidence of acute cardiopulmonary disease. Mild bibasilar atelectasis/scarring. Electronically Signed   By: Margarette Canada M.D.   On: 08/03/2018 10:50   Dg Chest 2 View  Result Date: 08/02/2018 CLINICAL DATA:  Cough and congestion EXAM: CHEST - 2 VIEW COMPARISON:  December 12, 2017 and September 14, 2017 FINDINGS: There is scarring and fibrosis in the mid and lower lung zones, most severe in the bases, stable. There is no frank edema or consolidation. Heart is borderline enlarged, stable, with pulmonary vascularity normal. No adenopathy. There is degenerative change in the thoracic spine. IMPRESSION: Fibrosis and scarring in the mid and lower lung zones, most severe in the bases, stable. No edema or consolidation. Stable cardiac prominence. No adenopathy evident. Electronically Signed   By: Lowella Grip III M.D.   On: 08/02/2018 08:42   Dg Pelvis 1-2 Views  Result Date: 08/03/2018 CLINICAL DATA:  Acute pelvic pain following fall last night. Initial encounter. EXAM: PELVIS - 1-2 VIEW COMPARISON:  09/18/2017 CT FINDINGS: There is no evidence of pelvic fracture or diastasis. No pelvic bone lesions are seen. IMPRESSION: Negative. Electronically Signed   By: Margarette Canada M.D.   On: 08/03/2018 10:54   Ct Head Wo Contrast  Result Date: 08/03/2018 CLINICAL DATA:  Altered level of consciousness.  Fall today. EXAM: CT HEAD WITHOUT CONTRAST CT CERVICAL  SPINE WITHOUT CONTRAST TECHNIQUE: Multidetector CT imaging of the head and cervical spine was performed following the standard protocol without intravenous contrast. Multiplanar CT image reconstructions of the cervical spine were also generated. COMPARISON:  CT head 02/21/2017 FINDINGS: CT HEAD FINDINGS Brain: Moderate atrophy. Moderate chronic microvascular ischemic change. Negative for acute infarct. Negative for acute hemorrhage or mass. No midline shift. Extensive dural ossification anteriorly the appears unchanged. Vascular: Negative for hyperdense vessel Skull: Negative Sinuses/Orbits: Negative Other: None CT CERVICAL SPINE FINDINGS Alignment: Straightening of the cervical lordosis with mild kyphosis at C3-4. Normal alignment. Skull base and vertebrae: Negative for fracture Soft tissues and spinal canal: 3 cm mass left lobe of the thyroid, incompletely evaluated on this study. Recommend thyroid ultrasound. Atherosclerotic calcification carotid artery. No enlarged lymph nodes in the neck. Disc levels: ACDF with anterior plate and screws C4 through C6. Disc degeneration and spurring at C2-3, C3-4, and C6-7. Disc and facet degeneration at C7-T1 and T1-2. Upper chest: Negative Other: None IMPRESSION: 1. Atrophy and chronic microvascular ischemia. No acute intracranial abnormality 2. ACDF C4 through C6. Negative for fracture. Cervical spondylosis as above 3. 3 cm  mass left lobe of thyroid incompletely evaluated on this study. Recommend thyroid ultrasound. Electronically Signed   By: Franchot Gallo M.D.   On: 08/03/2018 11:20   Ct Angio Chest Pe W And/or Wo Contrast  Result Date: 08/09/2018 CLINICAL DATA:  Elevated D-dimer, dementia, essential benign hypertension, GERD, type II diabetes mellitus history of pulmonary embolism EXAM: CT ANGIOGRAPHY CHEST WITH CONTRAST TECHNIQUE: Multidetector CT imaging of the chest was performed using the standard protocol during bolus administration of intravenous contrast.  Multiplanar CT image reconstructions and MIPs were obtained to evaluate the vascular anatomy. CONTRAST:  14mL ISOVUE-370 IOPAMIDOL (ISOVUE-370) INJECTION 76% IV [id number] COMPARISON:  CT chest 05/09/2014, CTA chest 12/01/2012 FINDINGS: Cardiovascular: Atherosclerotic calcifications aorta and proximal great vessels. Minimal aneurysmal dilatation of the ascending thoracic aorta 4.0 cm transverse image 28. Pulmonary arteries adequately opacified and patent. No evidence of pulmonary embolism. No pericardial effusion. Heart appears enlarged. Mediastinum/Nodes: BILATERAL thyroid lobe enlargement LEFT greater than RIGHT. Esophagus unremarkable. No adenopathy. Lungs/Pleura: Bibasilar atelectasis. No infiltrate, pleural effusion or pneumothorax. Upper Abdomen: No acute abnormalities. Musculoskeletal: Degenerative disc disease changes thoracic spine. Review of the MIP images confirms the above findings. IMPRESSION: No evidence of pulmonary embolism. Aneurysmal dilatation ascending thoracic aorta 4.0 cm transverse, recommendation below. Recommend annual imaging followup by CTA or MRA. This recommendation follows 2010 ACCF/AHA/AATS/ACR/ASA/SCA/SCAI/SIR/STS/SVM Guidelines for the Diagnosis and Management of Patients with Thoracic Aortic Disease. Circulation. 2010; 121: [id number] Aortic Atherosclerosis (ICD10-I70.0). Aortic aneurysm NOS (ICD10-I71.9). Electronically Signed   By: Lavonia Dana M.D.   On: 08/09/2018 22:23   Ct Cervical Spine Wo Contrast  Result Date: 08/03/2018 CLINICAL DATA:  Altered level of consciousness.  Fall today. EXAM: CT HEAD WITHOUT CONTRAST CT CERVICAL SPINE WITHOUT CONTRAST TECHNIQUE: Multidetector CT imaging of the head and cervical spine was performed following the standard protocol without intravenous contrast. Multiplanar CT image reconstructions of the cervical spine were also generated. COMPARISON:  CT head 02/21/2017 FINDINGS: CT HEAD FINDINGS Brain: Moderate atrophy. Moderate chronic  microvascular ischemic change. Negative for acute infarct. Negative for acute hemorrhage or mass. No midline shift. Extensive dural ossification anteriorly the appears unchanged. Vascular: Negative for hyperdense vessel Skull: Negative Sinuses/Orbits: Negative Other: None CT CERVICAL SPINE FINDINGS Alignment: Straightening of the cervical lordosis with mild kyphosis at C3-4. Normal alignment. Skull base and vertebrae: Negative for fracture Soft tissues and spinal canal: 3 cm mass left lobe of the thyroid, incompletely evaluated on this study. Recommend thyroid ultrasound. Atherosclerotic calcification carotid artery. No enlarged lymph nodes in the neck. Disc levels: ACDF with anterior plate and screws C4 through C6. Disc degeneration and spurring at C2-3, C3-4, and C6-7. Disc and facet degeneration at C7-T1 and T1-2. Upper chest: Negative Other: None IMPRESSION: 1. Atrophy and chronic microvascular ischemia. No acute intracranial abnormality 2. ACDF C4 through C6. Negative for fracture. Cervical spondylosis as above 3. 3 cm mass left lobe of thyroid incompletely evaluated on this study. Recommend thyroid ultrasound. Electronically Signed   By: Franchot Gallo M.D.   On: 08/03/2018 11:20   Dg Knee Complete 4 Views Right  Result Date: 08/03/2018 CLINICAL DATA:  [place]. Rt knee pain EXAM: RIGHT KNEE - COMPLETE 4+ VIEW COMPARISON:  07/27/2010 FINDINGS: Patient has undergone interval knee arthroplasty. The hardware is well-seated. No evidence for acute fracture or subluxation. Trace joint effusion is present. There is popliteal artery calcification. IMPRESSION: 1. Postoperative changes. 2. Trace joint effusion. 3.  No evidence for acute  abnormality. Electronically Signed   By: Nolon Nations M.D.  On: 08/03/2018 13:36    Assessment/Plan:   #1 history of falls- with history of nontraumatic rhabdomylolysis-he appears to have made a nice recovery from this has gained some strength with therapy will need continued  PT and OT- he is not really complaining of joint pain today.  2.  History of diabetes on Lantus and glipizide blood sugars as noted above appear fairly stable in the lower mid 100s it was 77 this morning but this is quite unusual.--We will continue to monitor until discharge he will be discharged apparently on Monday  3.  Hypertension currently on no medications his Lasix was discontinued in the hospital because of dehydration concerns- nonetheless as noted above blood pressures appear to be fairly stable with systolics recently largely in the 120-130 range.  4.  Chronic kidney disease stage III-this appears to be stable creatinine of 1.64 today with a BUN of 17.  5.  History of urinary incontinence continues on trospium and Flomax this is not really been an issue during his stay here.  6.  History of dementia he is on Aricept he is also on Zoloft for coexistent depression-she appears to be doing well with supportive care and again will be going home to live with his son.  7.  History of anemia continues on iron hemoglobin has shown stability during his stay here hemoglobin was 11 on lab done today.  8.  History of allergic rhinitis continues on Claritin.  9.  History of hyperlipidemia continues on a statin since his stay here was quite short have not been aggressive pursuing a lipid panel.  #10 tachycardia-it appears he will develop this at times-he is not symptomatic of any discomfort no dizziness chest pain syncope- possibly consider at some point low-dose beta-blocker but will defer to primary care provider since clinically he appears to be quite stable--this was discussed with Dr. Lyndel Safe  [phone] note greater than 30 minutes spent on this discharge summary- greater than 50% of time spent coordinating a plan of care for numerous diagnoses

## 2018-08-18 ENCOUNTER — Non-Acute Institutional Stay (SKILLED_NURSING_FACILITY): Payer: Medicare HMO | Admitting: Internal Medicine

## 2018-08-18 DIAGNOSIS — Z794 Long term (current) use of insulin: Secondary | ICD-10-CM | POA: Diagnosis not present

## 2018-08-18 DIAGNOSIS — IMO0001 Reserved for inherently not codable concepts without codable children: Secondary | ICD-10-CM

## 2018-08-18 DIAGNOSIS — R000 Tachycardia, unspecified: Secondary | ICD-10-CM

## 2018-08-18 DIAGNOSIS — E119 Type 2 diabetes mellitus without complications: Secondary | ICD-10-CM | POA: Diagnosis not present

## 2018-08-19 NOTE — Progress Notes (Signed)
This is an acute visit.  Level of care skilled.  Facility is [facility].  Chief complaint- acute visit follow-up intermittent tachycardia.  History of present illness.  Patient is a very pleasant 84 year old male he was going home early next week- he was seen yesterday for discharge and thought to be doing well.  He was here for rehab and strengthening after being found at home after a fall he also has a history of diabetes as well as hypertension hyperlipidemia BPH and urinary incontinence-he lives with his son and will be going back to live with his son.  He also has been noted at times to have some mild asymptomatic tachycardia-this was noted yesterday- and when he went to the ER one point apparently had this as well per EKG.  He does not complain of any dizziness headache syncope and his pulse today is in the mid 90s-blood pressure stable at 110/60.  This was discussed yesterday with Dr. Marletta Lor was decided would let primary care provider follow-up since he has been clinically stable and this is intermittent and asymptomatic--would be hesitant to change medications right before discharge secondary to his stability   Past Medical History:  Diagnosis Date  . Acute cholecystitis 11/2012   Drained percutaneously  . Anxiety   . BPH (benign prostatic hypertrophy)   . Deep venous thrombosis (HCC)    Left leg, diagnosed 7/12  . Dementia    Needs supervision for safety per son - POA  . Diverticulitis   . Essential hypertension, benign   . GERD (gastroesophageal reflux disease)   . History of pneumonia    Prior to 2012  . Incontinence of urine   . Mixed hyperlipidemia   . Osteoarthritis   . Pulmonary embolism ([place])    Diagnosed 7/12  . Type 2 diabetes mellitus ([place])          Past Surgical History:  Procedure Laterality Date  . CARPAL TUNNEL RELEASE    . COLONOSCOPY  08/31/2012   Procedure: COLONOSCOPY;  Surgeon: Rogene Houston, MD;  Location:  [facility];  Service: Endoscopy;  Laterality: N/A;  830  . CYSTOSCOPY WITH INJECTION N/A 05/15/2018   Procedure: CYSTOSCOPY WITH BOTOX INJECTION;  Surgeon: Cleon Gustin, MD;  Location: [facility];  Service: Urology;  Laterality: N/A;  . CYSTOSCOPY WITH INSERTION OF UROLIFT N/A 09/02/2016   Procedure: CYSTOSCOPY WITH INSERTION OF UROLIFT;  Surgeon: Cleon Gustin, MD;  Location: [facility];  Service: Urology;  Laterality: N/A;  . EYE SURGERY Bilateral    bilateral cataract  . Right total knee replacement  04/23/2011  . SPINE SURGERY  prior to 2012   Neck fusion  . UMBILICAL HERNIA REPAIR     umbilical      reports that he has never smoked. He has never used smokeless tobacco. He reports that he does not drink alcohol or use drugs. Social History        Socioeconomic History  . Marital status: Widowed    Spouse name: Not on file  . Number of children: Not on file  . Years of education: Not on file  . Highest education level: Not on file  Occupational History  . Not on file  Social Needs  . Financial resource strain: Not on file  . Food insecurity:    Worry: Not on file    Inability: Not on file  . Transportation needs:    Medical: Not on file    Non-medical: Not on  file  Tobacco Use  . Smoking status: Never Smoker  . Smokeless tobacco: Never Used  Substance and Sexual Activity  . Alcohol use: No  . Drug use: No  . Sexual activity: Never  Lifestyle  . Physical activity:    Days per week: Not on file    Minutes per session: Not on file  . Stress: Not on file  Relationships  . Social connections:    Talks on phone: Not on file    Gets together: Not on file    Attends religious service: Not on file    Active member of club or organization: Not on file    Attends meetings of clubs or organizations: Not on file    Relationship status: Not on file  . Intimate partner violence:    Fear of current or ex  partner: Not on file    Emotionally abused: Not on file    Physically abused: Not on file    Forced sexual activity: Not on file  Other Topics Concern  . Not on file  Social History Narrative  . Not on file   Functional Status Survey:       Allergies  Allergen Reactions  . Zosyn [Piperacillin Sod-Tazobactam So] Anaphylaxis, Swelling and Other (See Comments)    Facial swelling Has patient had a PCN reaction causing immediate rash, facial/tongue/throat swelling, SOB or lightheadedness with hypotension: Yes Has patient had a PCN reaction causing severe rash involving mucus membranes or skin necrosis: No Has patient had a PCN reaction that required hospitalization: No Has patient had a PCN reaction occurring within the last 10 years: Yes If all of the above answers are "NO", then may proceed with Cephalosporin use.   . Ace Inhibitors Cough  . Vancomycin Swelling and Other (See Comments)    Facial swelling        Pertinent  Health Maintenance Due  Topic Date Due  . INFLUENZA VACCINE  09/08/2018 (Originally 06/29/2018)  . OPHTHALMOLOGY EXAM  09/08/2018 (Originally 09/21/2016)  . URINE MICROALBUMIN  09/08/2018 (Originally 02/24/2018)  . FOOT EXAM  01/28/2019  . HEMOGLOBIN A1C  02/01/2019  . PNA vac Low Risk Adult  Completed    Medications:        Medication Sig  . acetaminophen (TYLENOL) 500 MG tablet Take 1,000 mg by mouth every 6 (six) hours as needed for mild pain, moderate pain or headache.   . Carboxymeth-Glycerin-Polysorb 0.5-1-0.5 % SOLN Place 1 drop into both eyes 4 (four) times daily as needed.  . donepezil (ARICEPT) 10 MG tablet TAKE 1 TABLET AT BEDTIME  . FEROSUL 325 (65 Fe) MG tablet TAKE 1 TABLET TWICE DAILY  . glipiZIDE (GLUCOTROL XL) 10 MG 24 hr tablet Take 1 tablet (10 mg total) by mouth daily with breakfast.  . glucose blood (TRUE METRIX BLOOD GLUCOSE TEST) test strip TEST ONCE DAILY dx e11.9  . Insulin Glargine (LANTUS SOLOSTAR) 100 UNIT/ML  Solostar Pen Inject 20 Units into the skin daily. HOLD for blood sugar levels below 100  . Insulin Pen Needle (PEN NEEDLES) 31G X 5 MM MISC 1 each by Does not apply route as directed. Use to inject lantus daily dx E11.65  . loratadine (CLARITIN) 10 MG tablet Take 10 mg by mouth every morning.   . lovastatin (MEVACOR) 40 MG tablet TAKE 1 TABLET AT BEDTIME  . omeprazole (PRILOSEC) 40 MG capsule TAKE 1 CAPSULE EVERY DAY  . sertraline (ZOLOFT) 50 MG tablet TAKE 1 TABLET EVERY DAY  . tamsulosin (FLOMAX)  0.4 MG CAPS capsule TAKE 1 CAPSULE EVERY DAY  . Trospium Chloride 60 MG CP24 Take 60 mg by mouth daily.   . Vitamin D, Ergocalciferol, (DRISDOL) 50000 units CAPS capsule TAKE 1 CAPSULE EVERY 7 (SEVEN) DAYS.   No facility-administered encounter medications on file as of 08/17/2018.     Review of Systems   I General continues to be in good spirits does not complain of any fever chills says he feels well.  Skin does not complain of any diaphoresis or itching.  Head ears eyes nose mouth and throat no visual changes complaints or sore throat.  Respiratory continues to deny any shortness of breath or cough.  Cardiac denies chest pain.  GI does not complain of abdominal discomfort nausea vomiting diarrhea constipation appears to have good appetite.  Musculoskeletal has apparently gained strength does not complain of joint pain.  Neurologic does not complain of feeling dizzy or syncopal or having headaches.  And psych continues to be in good spirits looking forward to going home    Physical exam he is afebrile pulse is 96 blood pressure 110/60.  General this is a pleasant elderly male in no distress sitting comfortably in his wheelchair.  His skin is warm and dry.  Chest is clear to auscultation there is no labored breathing.  Heart is regular rate and rhythm with an occasional irregular beat- heart sounds are distant Continues to have mild-moderate lower extremity  edema.  .  Musculoskeletal moves all extremities at baseline  Neurologic is grossly intact his speech is clear no true lateralizing findings.  Psych he is pleasant appropriate appears to have some mild/moderate cognitive deficits- he did answer simple questions appropriately but did have difficulty recalling his sons ages  Labs.  August 17, 2018   WBC 4.3 hemoglobin 11.0 platelets 254.   - Sodium 140 potassium 4.3 creatinine 1.64 BUN 17.  Assessment plan.  1.-  Intermittent tachycardia-he remains asymptomatic pulses in the 90s today- as noted above will defer to primary care provider since clinically he is stable and is about to go home-again would be hesitant to change medications right before discharge since he has been stable.  Continue to monitor-- blood pressure appears to be stable clinical status appears to be stable.  2.  Type 2 diabetes- continues on Lantus and glipizide-blood sugars appear to be stable his blood sugar yesterday morning was 77 but this appears to be abnormal he has been fairly consistently in the lower mid 100s in the morning- which was of the case today.    [id number]

## 2018-08-22 ENCOUNTER — Other Ambulatory Visit: Payer: Self-pay

## 2018-08-22 DIAGNOSIS — F0280 Dementia in other diseases classified elsewhere without behavioral disturbance: Secondary | ICD-10-CM | POA: Diagnosis not present

## 2018-08-22 DIAGNOSIS — W19XXXD Unspecified fall, subsequent encounter: Secondary | ICD-10-CM | POA: Diagnosis not present

## 2018-08-22 DIAGNOSIS — M1991 Primary osteoarthritis, unspecified site: Secondary | ICD-10-CM | POA: Diagnosis not present

## 2018-08-22 DIAGNOSIS — G309 Alzheimer's disease, unspecified: Secondary | ICD-10-CM | POA: Diagnosis not present

## 2018-08-22 DIAGNOSIS — I129 Hypertensive chronic kidney disease with stage 1 through stage 4 chronic kidney disease, or unspecified chronic kidney disease: Secondary | ICD-10-CM | POA: Diagnosis not present

## 2018-08-22 DIAGNOSIS — N400 Enlarged prostate without lower urinary tract symptoms: Secondary | ICD-10-CM | POA: Diagnosis not present

## 2018-08-22 DIAGNOSIS — N183 Chronic kidney disease, stage 3 (moderate): Secondary | ICD-10-CM | POA: Diagnosis not present

## 2018-08-22 DIAGNOSIS — E1122 Type 2 diabetes mellitus with diabetic chronic kidney disease: Secondary | ICD-10-CM | POA: Diagnosis not present

## 2018-08-22 DIAGNOSIS — M6282 Rhabdomyolysis: Secondary | ICD-10-CM | POA: Diagnosis not present

## 2018-08-22 LAB — STOOL CULTURE REFLEX - CMPCXR

## 2018-08-22 LAB — STOOL CULTURE: E coli, Shiga toxin Assay: NEGATIVE

## 2018-08-22 LAB — STOOL CULTURE REFLEX - RSASHR

## 2018-08-22 NOTE — Patient Outreach (Signed)
[facility]) Care Management  08/22/2018  WINIFRED BALOGH May 10, 1931 [id number]   Referral Date: 08/22/18 Referral Source: [employer] Report Date of Admission: 08/08/18 Diagnosis:  Fall/weakness Date of Discharge: 08/21/18 Facility: [facility] Insurance: [facility]  Outreach attempt: spoke with son Chase Caller.  He is able to verify HIPAA.  He states that patient got home on yesterday and has done well today.  He states that Josephine is involved with care and has made a visit today.  He states that patient does need assist with care and family provides.  He is able to review medications and offers no concerns presently.  Patient has an appointment with Dr. Moshe Cipro next week and family will be transporting patient to appointment.    Discussed with son [facility] services.  He declines needs presently.  CM contact information provided for any further assistance.    Plan: RN CM will close case.   Jone Baseman, RN, MSN [facility] Care Management Care Management Coordinator Direct Line [phone] Toll Free: [phone]  Fax: [phone]

## 2018-08-25 DIAGNOSIS — W19XXXD Unspecified fall, subsequent encounter: Secondary | ICD-10-CM | POA: Diagnosis not present

## 2018-08-25 DIAGNOSIS — G309 Alzheimer's disease, unspecified: Secondary | ICD-10-CM | POA: Diagnosis not present

## 2018-08-25 DIAGNOSIS — F0280 Dementia in other diseases classified elsewhere without behavioral disturbance: Secondary | ICD-10-CM | POA: Diagnosis not present

## 2018-08-25 DIAGNOSIS — N183 Chronic kidney disease, stage 3 (moderate): Secondary | ICD-10-CM | POA: Diagnosis not present

## 2018-08-25 DIAGNOSIS — I129 Hypertensive chronic kidney disease with stage 1 through stage 4 chronic kidney disease, or unspecified chronic kidney disease: Secondary | ICD-10-CM | POA: Diagnosis not present

## 2018-08-25 DIAGNOSIS — M6282 Rhabdomyolysis: Secondary | ICD-10-CM | POA: Diagnosis not present

## 2018-08-25 DIAGNOSIS — M1991 Primary osteoarthritis, unspecified site: Secondary | ICD-10-CM | POA: Diagnosis not present

## 2018-08-25 DIAGNOSIS — N400 Enlarged prostate without lower urinary tract symptoms: Secondary | ICD-10-CM | POA: Diagnosis not present

## 2018-08-25 DIAGNOSIS — E1122 Type 2 diabetes mellitus with diabetic chronic kidney disease: Secondary | ICD-10-CM | POA: Diagnosis not present

## 2018-08-28 ENCOUNTER — Ambulatory Visit (INDEPENDENT_AMBULATORY_CARE_PROVIDER_SITE_OTHER): Payer: Medicare HMO

## 2018-08-28 VITALS — BP 122/64 | HR 82 | Resp 16 | Ht 70.0 in | Wt 262.0 lb

## 2018-08-28 DIAGNOSIS — Z23 Encounter for immunization: Secondary | ICD-10-CM

## 2018-08-28 DIAGNOSIS — Z0000 Encounter for general adult medical examination without abnormal findings: Secondary | ICD-10-CM | POA: Diagnosis not present

## 2018-08-28 NOTE — Patient Instructions (Signed)
Samuel Ryan , Thank you for taking time to come for your Medicare Wellness Visit. I appreciate your ongoing commitment to your health goals. Please review the following plan we discussed and let me know if I can assist you in the future.    If no cancellation and you cannot be worked in today, reschedule your TOC visit since you cannot come on Thursday.   Flu vaccine given today     Screening recommendations/referrals: Colonoscopy: up to date  Recommended yearly ophthalmology/optometry visit for glaucoma screening and checkup Recommended yearly dental visit for hygiene and checkup  Vaccinations: Influenza vaccine: given today  Pneumococcal vaccine: up to date  Tdap vaccine: up to date  Shingles vaccine: ask insurance if covered    Advanced directives: done and copy in chart   Conditions/risks identified: done   Next appointment: wellness in 1 year   Preventive Care 82 Years and Older, Male Preventive care refers to lifestyle choices and visits with your health care provider that can promote health and wellness. What does preventive care include?  A yearly physical exam. This is also called an annual well check.  Dental exams once or twice a year.  Routine eye exams. Ask your health care provider how often you should have your eyes checked.  Personal lifestyle choices, including:  Daily care of your teeth and gums.  Regular physical activity.  Eating a healthy diet.  Avoiding tobacco and drug use.  Limiting alcohol use.  Practicing safe sex.  Taking low doses of aspirin every day.  Taking vitamin and mineral supplements as recommended by your health care provider. What happens during an annual well check? The services and screenings done by your health care provider during your annual well check will depend on your age, overall health, lifestyle risk factors, and family history of disease. Counseling  Your health care provider may ask you questions about  your:  Alcohol use.  Tobacco use.  Drug use.  Emotional well-being.  Home and relationship well-being.  Sexual activity.  Eating habits.  History of falls.  Memory and ability to understand (cognition).  Work and work Statistician. Screening  You may have the following tests or measurements:  Height, weight, and BMI.  Blood pressure.  Lipid and cholesterol levels. These may be checked every 5 years, or more frequently if you are over 82 years old.  Skin check.  Lung cancer screening. You may have this screening every year starting at age 57 if you have a 30-pack-year history of smoking and currently smoke or have quit within the past 15 years.  Fecal occult blood test (FOBT) of the stool. You may have this test every year starting at age 61.  Flexible sigmoidoscopy or colonoscopy. You may have a sigmoidoscopy every 5 years or a colonoscopy every 10 years starting at age 60.  Prostate cancer screening. Recommendations will vary depending on your family history and other risks.  Hepatitis C blood test.  Hepatitis B blood test.  Sexually transmitted disease (STD) testing.  Diabetes screening. This is done by checking your blood sugar (glucose) after you have not eaten for a while (fasting). You may have this done every 1-3 years.  Abdominal aortic aneurysm (AAA) screening. You may need this if you are a current or former smoker.  Osteoporosis. You may be screened starting at age 31 if you are at high risk. Talk with your health care provider about your test results, treatment options, and if necessary, the need for more tests. Vaccines  Your health care provider may recommend certain vaccines, such as:  Influenza vaccine. This is recommended every year.  Tetanus, diphtheria, and acellular pertussis (Tdap, Td) vaccine. You may need a Td booster every 10 years.  Zoster vaccine. You may need this after age 16.  Pneumococcal 13-valent conjugate (PCV13) vaccine.  One dose is recommended after age 62.  Pneumococcal polysaccharide (PPSV23) vaccine. One dose is recommended after age 25. Talk to your health care provider about which screenings and vaccines you need and how often you need them. This information is not intended to replace advice given to you by your health care provider. Make sure you discuss any questions you have with your health care provider. Document Released: 12/12/2015 Document Revised: 08/04/2016 Document Reviewed: 09/16/2015 Elsevier Interactive Patient Education  2017 [place] Prevention in the Home Falls can cause injuries. They can happen to people of all ages. There are many things you can do to make your home safe and to help prevent falls. What can I do on the outside of my home?  Regularly fix the edges of walkways and driveways and fix any cracks.  Remove anything that might make you trip as you walk through a door, such as a raised step or threshold.  Trim any bushes or trees on the path to your home.  Use bright outdoor lighting.  Clear any walking paths of anything that might make someone trip, such as rocks or tools.  Regularly check to see if handrails are loose or broken. Make sure that both sides of any steps have handrails.  Any raised decks and porches should have guardrails on the edges.  Have any leaves, snow, or ice cleared regularly.  Use sand or salt on walking paths during winter.  Clean up any spills in your garage right away. This includes oil or grease spills. What can I do in the bathroom?  Use night lights.  Install grab bars by the toilet and in the tub and shower. Do not use towel bars as grab bars.  Use non-skid mats or decals in the tub or shower.  If you need to sit down in the shower, use a plastic, non-slip stool.  Keep the floor dry. Clean up any water that spills on the floor as soon as it happens.  Remove soap buildup in the tub or shower regularly.  Attach bath  mats securely with double-sided non-slip rug tape.  Do not have throw rugs and other things on the floor that can make you trip. What can I do in the bedroom?  Use night lights.  Make sure that you have a light by your bed that is easy to reach.  Do not use any sheets or blankets that are too big for your bed. They should not hang down onto the floor.  Have a firm chair that has side arms. You can use this for support while you get dressed.  Do not have throw rugs and other things on the floor that can make you trip. What can I do in the kitchen?  Clean up any spills right away.  Avoid walking on wet floors.  Keep items that you use a lot in easy-to-reach places.  If you need to reach something above you, use a strong step stool that has a grab bar.  Keep electrical cords out of the way.  Do not use floor polish or wax that makes floors slippery. If you must use wax, use non-skid floor wax.  Do  not have throw rugs and other things on the floor that can make you trip. What can I do with my stairs?  Do not leave any items on the stairs.  Make sure that there are handrails on both sides of the stairs and use them. Fix handrails that are broken or loose. Make sure that handrails are as long as the stairways.  Check any carpeting to make sure that it is firmly attached to the stairs. Fix any carpet that is loose or worn.  Avoid having throw rugs at the top or bottom of the stairs. If you do have throw rugs, attach them to the floor with carpet tape.  Make sure that you have a light switch at the top of the stairs and the bottom of the stairs. If you do not have them, ask someone to add them for you. What else can I do to help prevent falls?  Wear shoes that:  Do not have high heels.  Have rubber bottoms.  Are comfortable and fit you well.  Are closed at the toe. Do not wear sandals.  If you use a stepladder:  Make sure that it is fully opened. Do not climb a closed  stepladder.  Make sure that both sides of the stepladder are locked into place.  Ask someone to hold it for you, if possible.  Clearly mark and make sure that you can see:  Any grab bars or handrails.  First and last steps.  Where the edge of each step is.  Use tools that help you move around (mobility aids) if they are needed. These include:  Canes.  Walkers.  Scooters.  Crutches.  Turn on the lights when you go into a dark area. Replace any light bulbs as soon as they burn out.  Set up your furniture so you have a clear path. Avoid moving your furniture around.  If any of your floors are uneven, fix them.  If there are any pets around you, be aware of where they are.  Review your medicines with your doctor. Some medicines can make you feel dizzy. This can increase your chance of falling. Ask your doctor what other things that you can do to help prevent falls. This information is not intended to replace advice given to you by your health care provider. Make sure you discuss any questions you have with your health care provider. Document Released: 09/11/2009 Document Revised: 04/22/2016 Document Reviewed: 12/20/2014 Elsevier Interactive Patient Education  2017 [employer].

## 2018-08-28 NOTE — Progress Notes (Signed)
Subjective:   Samuel Ryan is a 84 y.o. male who presents for Medicare Annual/Subsequent preventive examination.  Review of Systems:   Cardiac Risk Factors include: advanced age (>82men, >35 women);diabetes mellitus;dyslipidemia;hypertension;male gender;obesity (BMI >30kg/m2);sedentary lifestyle     Objective:    Vitals: BP 122/64   Pulse 82   Resp 16   Ht 5\' 10"  (1.778 m)   Wt 262 lb (118.8 kg)   SpO2 90%   BMI 37.59 kg/m   Body mass index is 37.59 kg/m.  Advanced Directives 08/28/2018 08/22/2018 08/17/2018 08/11/2018 08/11/2018 08/10/2018 08/09/2018  Does Patient Have a Medical Advance Directive? Yes Yes Yes Yes Yes Yes Yes  Type of Advance Directive - Out of facility DNR (pink MOST or yellow form);Living will Out of facility DNR (pink MOST or yellow form) Out of facility DNR (pink MOST or yellow form) Out of facility DNR (pink MOST or yellow form) Out of facility DNR (pink MOST or yellow form) Out of facility DNR (pink MOST or yellow form)  Does patient want to make changes to medical advance directive? Yes (Inpatient - patient requests chaplain consult to change a medical advance directive) No - Patient declined No - Patient declined No - Patient declined - No - Patient declined -  Copy of [place] in Chart? Yes No - copy requested No - copy requested No - copy requested No - copy requested No - copy requested -  Would patient like information on creating a medical advance directive? - - - - - - -  Pre-existing out of facility DNR order (yellow form or pink MOST form) - - - - - - -    Tobacco Social History   Tobacco Use  Smoking Status Never Smoker  Smokeless Tobacco Never Used     Counseling given: Not Answered   Clinical Intake:  Pre-visit preparation completed: Yes  Pain : No/denies pain Pain Score: 0-No pain     Diabetes: Yes  How often do you need to have someone help you when you read instructions, pamphlets, or other written materials  from your doctor or pharmacy?: 5 - Always What is the last grade level you completed in school?: GED      Information entered by :: Kate Sable LPN   Past Medical History:  Diagnosis Date  . Acute cholecystitis 11/2012   Drained percutaneously  . Anxiety   . BPH (benign prostatic hypertrophy)   . Deep venous thrombosis (HCC)    Left leg, diagnosed 7/12  . Dementia    Needs supervision for safety per son - POA  . Diverticulitis   . Essential hypertension, benign   . GERD (gastroesophageal reflux disease)   . History of pneumonia    Prior to 2012  . Incontinence of urine   . Mixed hyperlipidemia   . Osteoarthritis   . Pulmonary embolism ([place])    Diagnosed 7/12  . Type 2 diabetes mellitus ([place])    Past Surgical History:  Procedure Laterality Date  . CARPAL TUNNEL RELEASE    . COLONOSCOPY  08/31/2012   Procedure: COLONOSCOPY;  Surgeon: Rogene Houston, MD;  Location: [facility];  Service: Endoscopy;  Laterality: N/A;  830  . CYSTOSCOPY WITH INJECTION N/A 05/15/2018   Procedure: CYSTOSCOPY WITH BOTOX INJECTION;  Surgeon: Cleon Gustin, MD;  Location: [facility];  Service: Urology;  Laterality: N/A;  . CYSTOSCOPY WITH INSERTION OF UROLIFT N/A 09/02/2016   Procedure: CYSTOSCOPY WITH INSERTION OF UROLIFT;  Surgeon: Saralyn Pilar  Alita Chyle, MD;  Location: [facility];  Service: Urology;  Laterality: N/A;  . EYE SURGERY Bilateral    bilateral cataract  . Right total knee replacement  04/23/2011  . SPINE SURGERY  prior to 2012   Neck fusion  . UMBILICAL HERNIA REPAIR     umbilical   Family History  Problem Relation Age of Onset  . Cancer Mother        Stomach  . Diabetes Sister   . Hypertension Sister   . Diabetes Brother   . Cancer Brother        Gallbladder  . Seizures Son        due to meningitis as a child   Social History   Socioeconomic History  . Marital status: Widowed    Spouse name: Not on file  . Number of children: Not on file  . Years of  education: Not on file  . Highest education level: Not on file  Occupational History  . Not on file  Social Needs  . Financial resource strain: Not on file  . Food insecurity:    Worry: Not on file    Inability: Not on file  . Transportation needs:    Medical: Not on file    Non-medical: Not on file  Tobacco Use  . Smoking status: Never Smoker  . Smokeless tobacco: Never Used  Substance and Sexual Activity  . Alcohol use: No  . Drug use: No  . Sexual activity: Never  Lifestyle  . Physical activity:    Days per week: Not on file    Minutes per session: Not on file  . Stress: Not on file  Relationships  . Social connections:    Talks on phone: Not on file    Gets together: Not on file    Attends religious service: Not on file    Active member of club or organization: Not on file    Attends meetings of clubs or organizations: Not on file    Relationship status: Not on file  Other Topics Concern  . Not on file  Social History Narrative  . Not on file    Outpatient Encounter Medications as of 08/28/2018  Medication Sig  . acetaminophen (TYLENOL) 500 MG tablet Take 1,000 mg by mouth every 6 (six) hours as needed for mild pain, moderate pain or headache.   . Carboxymeth-Glycerin-Polysorb 0.5-1-0.5 % SOLN Place 1 drop into both eyes 4 (four) times daily as needed.  . donepezil (ARICEPT) 10 MG tablet TAKE 1 TABLET AT BEDTIME  . FEROSUL 325 (65 Fe) MG tablet TAKE 1 TABLET TWICE DAILY  . glipiZIDE (GLUCOTROL XL) 10 MG 24 hr tablet Take 1 tablet (10 mg total) by mouth daily with breakfast.  . glucose blood (TRUE METRIX BLOOD GLUCOSE TEST) test strip TEST ONCE DAILY dx e11.9  . Insulin Glargine (LANTUS SOLOSTAR) 100 UNIT/ML Solostar Pen Inject 20 Units into the skin daily. HOLD for blood sugar levels below 100  . Insulin Pen Needle (PEN NEEDLES) 31G X 5 MM MISC 1 each by Does not apply route as directed. Use to inject lantus daily dx E11.65  . loratadine (CLARITIN) 10 MG tablet Take  10 mg by mouth every morning.   . lovastatin (MEVACOR) 40 MG tablet TAKE 1 TABLET AT BEDTIME  . omeprazole (PRILOSEC) 40 MG capsule TAKE 1 CAPSULE EVERY DAY  . sertraline (ZOLOFT) 50 MG tablet TAKE 1 TABLET EVERY DAY  . tamsulosin (FLOMAX) 0.4 MG CAPS capsule TAKE  1 CAPSULE EVERY DAY  . Trospium Chloride 60 MG CP24 Take 60 mg by mouth daily.   . Vitamin D, Ergocalciferol, (DRISDOL) 50000 units CAPS capsule TAKE 1 CAPSULE EVERY 7 (SEVEN) DAYS.   No facility-administered encounter medications on file as of 08/28/2018.     Activities of Daily Living In your present state of health, do you have any difficulty performing the following activities: 08/28/2018 08/03/2018  Hearing? Y N  Vision? N N  Difficulty concentrating or making decisions? Y N  Comment - -  Walking or climbing stairs? Y N  Dressing or bathing? Y Y  Doing errands, shopping? Tempie Donning  Preparing Food and eating ? N -  Using the Toilet? N -  In the past six months, have you accidently leaked urine? N -  Do you have problems with loss of bowel control? N -  Managing your Medications? Y -  Managing your Finances? Y -  Housekeeping or managing your Housekeeping? Y -  Some recent data might be hidden    Patient Care Team: [name], MD as PCP - General (Family Medicine) Alyson Ingles [name], MD as Consulting Physician (Urology)   Assessment:   This is a routine wellness examination for [place].  Exercise Activities and Dietary recommendations Current Exercise Habits: The patient does not participate in regular exercise at present, Exercise limited by: orthopedic condition(s);respiratory conditions(s);cardiac condition(s)  Goals    . Exercise 3x per week (30 min per time)     Starting 12/02/2016 patient would like to exercise more. He would like to start going to silver sneakers again.       Fall Risk Fall Risk  08/28/2018 06/06/2018 06/21/2017 12/01/2016 11/27/2015  Falls in the past year? Yes No No No No  Number falls in  past yr: 1 - - - -  Risk for fall due to : - - - - -   Is the patient's home free of loose throw rugs in walkways, pet beds, electrical cords, etc?   yes      Grab bars in the bathroom? yes      Handrails on the stairs?   yes      Adequate lighting?   yes  Timed Get Up and Go Performed:   Depression Screen PHQ 2/9 Scores 08/28/2018 06/06/2018 06/21/2017 03/01/2017  PHQ - 2 Score 0 0 0 0  PHQ- 9 Score 8 4 - -  Exception Documentation - - - -  Not completed - - - -    Cognitive Function MMSE - Mini Mental State Exam 12/01/2016  Orientation to time 5  Orientation to Place 5  Registration 3  Attention/ Calculation 5  Recall 0  Language- name 2 objects 2  Language- repeat 1  Language- follow 3 step command 3  Language- read & follow direction 1  Write a sentence 1  Copy design 1  Total score 27     6CIT Screen 08/28/2018  What Year? 0 points  What month? 3 points  What time? 0 points  Count back from 20 2 points  Months in reverse 4 points  Repeat phrase 10 points  Total Score 19    Immunization History  Administered Date(s) Administered  . Influenza Split 10/14/2011, 09/26/2012, 09/29/2013  . Influenza Whole 09/07/2007, 09/04/2009, 08/04/2010  . Influenza,inj,Quad PF,6+ Mos 09/09/2014, 08/13/2016, 10/05/2017  . Pneumococcal Conjugate-13 07/16/2014  . Pneumococcal Polysaccharide-23 08/04/2010  . Td 07/28/2004  . Zoster 03/08/2007    Qualifies for Shingles Vaccine?  Screening Tests Health Maintenance  Topic Date Due  . INFLUENZA VACCINE  09/08/2018 (Originally 06/29/2018)  . OPHTHALMOLOGY EXAM  09/08/2018 (Originally 09/21/2016)  . URINE MICROALBUMIN  09/08/2018 (Originally 02/24/2018)  . TETANUS/TDAP  12/12/2018 (Originally 07/28/2014)  . FOOT EXAM  01/28/2019  . HEMOGLOBIN A1C  02/01/2019  . PNA vac Low Risk Adult  Completed   Cancer Screenings: Lung: Low Dose CT Chest recommended if Age 24-80 years, 30 pack-year currently smoking OR have quit w/in 15years.  Patient does not qualify. Colorectal: up to date   Additional Screenings: Hepatitis C Screening:      Plan:     I have personally reviewed and noted the following in the patient's chart:   . Medical and social history . Use of alcohol, tobacco or illicit drugs  . Current medications and supplements . Functional ability and status . Nutritional status . Physical activity . Advanced directives . List of other physicians . Hospitalizations, surgeries, and ER visits in previous 12 months . Vitals . Screenings to include cognitive, depression, and falls . Referrals and appointments  In addition, I have reviewed and discussed with patient certain preventive protocols, quality metrics, and best practice recommendations. A written personalized care plan for preventive services as well as general preventive health recommendations were provided to patient.     Kate Sable, LPN, LPN  [id number]

## 2018-08-29 DIAGNOSIS — N400 Enlarged prostate without lower urinary tract symptoms: Secondary | ICD-10-CM | POA: Diagnosis not present

## 2018-08-29 DIAGNOSIS — I129 Hypertensive chronic kidney disease with stage 1 through stage 4 chronic kidney disease, or unspecified chronic kidney disease: Secondary | ICD-10-CM | POA: Diagnosis not present

## 2018-08-29 DIAGNOSIS — E1122 Type 2 diabetes mellitus with diabetic chronic kidney disease: Secondary | ICD-10-CM | POA: Diagnosis not present

## 2018-08-29 DIAGNOSIS — N183 Chronic kidney disease, stage 3 (moderate): Secondary | ICD-10-CM | POA: Diagnosis not present

## 2018-08-29 DIAGNOSIS — W19XXXD Unspecified fall, subsequent encounter: Secondary | ICD-10-CM | POA: Diagnosis not present

## 2018-08-29 DIAGNOSIS — M1991 Primary osteoarthritis, unspecified site: Secondary | ICD-10-CM | POA: Diagnosis not present

## 2018-08-29 DIAGNOSIS — G309 Alzheimer's disease, unspecified: Secondary | ICD-10-CM | POA: Diagnosis not present

## 2018-08-29 DIAGNOSIS — M6282 Rhabdomyolysis: Secondary | ICD-10-CM | POA: Diagnosis not present

## 2018-08-29 DIAGNOSIS — F0280 Dementia in other diseases classified elsewhere without behavioral disturbance: Secondary | ICD-10-CM | POA: Diagnosis not present

## 2018-08-30 DIAGNOSIS — M6282 Rhabdomyolysis: Secondary | ICD-10-CM | POA: Diagnosis not present

## 2018-08-30 DIAGNOSIS — I129 Hypertensive chronic kidney disease with stage 1 through stage 4 chronic kidney disease, or unspecified chronic kidney disease: Secondary | ICD-10-CM | POA: Diagnosis not present

## 2018-08-30 DIAGNOSIS — N183 Chronic kidney disease, stage 3 (moderate): Secondary | ICD-10-CM | POA: Diagnosis not present

## 2018-08-30 DIAGNOSIS — E1122 Type 2 diabetes mellitus with diabetic chronic kidney disease: Secondary | ICD-10-CM | POA: Diagnosis not present

## 2018-08-30 DIAGNOSIS — W19XXXD Unspecified fall, subsequent encounter: Secondary | ICD-10-CM | POA: Diagnosis not present

## 2018-08-30 DIAGNOSIS — F0280 Dementia in other diseases classified elsewhere without behavioral disturbance: Secondary | ICD-10-CM | POA: Diagnosis not present

## 2018-08-30 DIAGNOSIS — M1991 Primary osteoarthritis, unspecified site: Secondary | ICD-10-CM | POA: Diagnosis not present

## 2018-08-30 DIAGNOSIS — G309 Alzheimer's disease, unspecified: Secondary | ICD-10-CM | POA: Diagnosis not present

## 2018-08-30 DIAGNOSIS — N400 Enlarged prostate without lower urinary tract symptoms: Secondary | ICD-10-CM | POA: Diagnosis not present

## 2018-08-31 ENCOUNTER — Ambulatory Visit: Payer: Self-pay | Admitting: Family Medicine

## 2018-08-31 DIAGNOSIS — Z794 Long term (current) use of insulin: Secondary | ICD-10-CM

## 2018-08-31 DIAGNOSIS — M1991 Primary osteoarthritis, unspecified site: Secondary | ICD-10-CM

## 2018-08-31 DIAGNOSIS — Z79899 Other long term (current) drug therapy: Secondary | ICD-10-CM

## 2018-08-31 DIAGNOSIS — F0280 Dementia in other diseases classified elsewhere without behavioral disturbance: Secondary | ICD-10-CM | POA: Diagnosis not present

## 2018-08-31 DIAGNOSIS — Z79891 Long term (current) use of opiate analgesic: Secondary | ICD-10-CM

## 2018-08-31 DIAGNOSIS — N183 Chronic kidney disease, stage 3 (moderate): Secondary | ICD-10-CM

## 2018-08-31 DIAGNOSIS — I129 Hypertensive chronic kidney disease with stage 1 through stage 4 chronic kidney disease, or unspecified chronic kidney disease: Secondary | ICD-10-CM

## 2018-08-31 DIAGNOSIS — W19XXXD Unspecified fall, subsequent encounter: Secondary | ICD-10-CM | POA: Diagnosis not present

## 2018-08-31 DIAGNOSIS — G309 Alzheimer's disease, unspecified: Secondary | ICD-10-CM | POA: Diagnosis not present

## 2018-08-31 DIAGNOSIS — N400 Enlarged prostate without lower urinary tract symptoms: Secondary | ICD-10-CM

## 2018-08-31 DIAGNOSIS — Z96651 Presence of right artificial knee joint: Secondary | ICD-10-CM

## 2018-08-31 DIAGNOSIS — Z9181 History of falling: Secondary | ICD-10-CM

## 2018-08-31 DIAGNOSIS — E1122 Type 2 diabetes mellitus with diabetic chronic kidney disease: Secondary | ICD-10-CM | POA: Diagnosis not present

## 2018-08-31 DIAGNOSIS — M6282 Rhabdomyolysis: Secondary | ICD-10-CM | POA: Diagnosis not present

## 2018-09-01 DIAGNOSIS — F329 Major depressive disorder, single episode, unspecified: Secondary | ICD-10-CM | POA: Diagnosis not present

## 2018-09-01 DIAGNOSIS — R0902 Hypoxemia: Secondary | ICD-10-CM | POA: Diagnosis not present

## 2018-09-01 DIAGNOSIS — G308 Other Alzheimer's disease: Secondary | ICD-10-CM | POA: Diagnosis not present

## 2018-09-01 DIAGNOSIS — E1129 Type 2 diabetes mellitus with other diabetic kidney complication: Secondary | ICD-10-CM | POA: Diagnosis not present

## 2018-09-05 DIAGNOSIS — F0280 Dementia in other diseases classified elsewhere without behavioral disturbance: Secondary | ICD-10-CM | POA: Diagnosis not present

## 2018-09-05 DIAGNOSIS — M6282 Rhabdomyolysis: Secondary | ICD-10-CM | POA: Diagnosis not present

## 2018-09-05 DIAGNOSIS — E1122 Type 2 diabetes mellitus with diabetic chronic kidney disease: Secondary | ICD-10-CM | POA: Diagnosis not present

## 2018-09-05 DIAGNOSIS — W19XXXD Unspecified fall, subsequent encounter: Secondary | ICD-10-CM | POA: Diagnosis not present

## 2018-09-05 DIAGNOSIS — I129 Hypertensive chronic kidney disease with stage 1 through stage 4 chronic kidney disease, or unspecified chronic kidney disease: Secondary | ICD-10-CM | POA: Diagnosis not present

## 2018-09-05 DIAGNOSIS — G309 Alzheimer's disease, unspecified: Secondary | ICD-10-CM | POA: Diagnosis not present

## 2018-09-05 DIAGNOSIS — M1991 Primary osteoarthritis, unspecified site: Secondary | ICD-10-CM | POA: Diagnosis not present

## 2018-09-05 DIAGNOSIS — N400 Enlarged prostate without lower urinary tract symptoms: Secondary | ICD-10-CM | POA: Diagnosis not present

## 2018-09-05 DIAGNOSIS — N183 Chronic kidney disease, stage 3 (moderate): Secondary | ICD-10-CM | POA: Diagnosis not present

## 2018-09-07 ENCOUNTER — Encounter: Payer: Self-pay | Admitting: Family Medicine

## 2018-09-07 ENCOUNTER — Ambulatory Visit (INDEPENDENT_AMBULATORY_CARE_PROVIDER_SITE_OTHER): Payer: Medicare HMO | Admitting: Family Medicine

## 2018-09-07 VITALS — BP 130/70 | HR 76 | Resp 12 | Ht 70.0 in | Wt 262.1 lb

## 2018-09-07 DIAGNOSIS — M6282 Rhabdomyolysis: Secondary | ICD-10-CM

## 2018-09-07 DIAGNOSIS — E1122 Type 2 diabetes mellitus with diabetic chronic kidney disease: Secondary | ICD-10-CM | POA: Diagnosis not present

## 2018-09-07 DIAGNOSIS — E785 Hyperlipidemia, unspecified: Secondary | ICD-10-CM

## 2018-09-07 DIAGNOSIS — W19XXXD Unspecified fall, subsequent encounter: Secondary | ICD-10-CM | POA: Diagnosis not present

## 2018-09-07 DIAGNOSIS — Z794 Long term (current) use of insulin: Secondary | ICD-10-CM

## 2018-09-07 DIAGNOSIS — E119 Type 2 diabetes mellitus without complications: Secondary | ICD-10-CM

## 2018-09-07 DIAGNOSIS — M1991 Primary osteoarthritis, unspecified site: Secondary | ICD-10-CM | POA: Diagnosis not present

## 2018-09-07 DIAGNOSIS — N400 Enlarged prostate without lower urinary tract symptoms: Secondary | ICD-10-CM | POA: Diagnosis not present

## 2018-09-07 DIAGNOSIS — I10 Essential (primary) hypertension: Secondary | ICD-10-CM | POA: Diagnosis not present

## 2018-09-07 DIAGNOSIS — Z09 Encounter for follow-up examination after completed treatment for conditions other than malignant neoplasm: Secondary | ICD-10-CM | POA: Diagnosis not present

## 2018-09-07 DIAGNOSIS — I129 Hypertensive chronic kidney disease with stage 1 through stage 4 chronic kidney disease, or unspecified chronic kidney disease: Secondary | ICD-10-CM | POA: Diagnosis not present

## 2018-09-07 DIAGNOSIS — F0280 Dementia in other diseases classified elsewhere without behavioral disturbance: Secondary | ICD-10-CM | POA: Diagnosis not present

## 2018-09-07 DIAGNOSIS — IMO0001 Reserved for inherently not codable concepts without codable children: Secondary | ICD-10-CM

## 2018-09-07 DIAGNOSIS — N183 Chronic kidney disease, stage 3 (moderate): Secondary | ICD-10-CM | POA: Diagnosis not present

## 2018-09-07 DIAGNOSIS — M791 Myalgia, unspecified site: Secondary | ICD-10-CM | POA: Diagnosis not present

## 2018-09-07 DIAGNOSIS — G309 Alzheimer's disease, unspecified: Secondary | ICD-10-CM | POA: Diagnosis not present

## 2018-09-07 LAB — BASIC METABOLIC PANEL WITH GFR
BUN/Creatinine Ratio: 9 (calc) (ref 6–22)
BUN: 15 mg/dL (ref 7–25)
CALCIUM: 8.6 mg/dL (ref 8.6–10.3)
CO2: 29 mmol/L (ref 20–32)
Chloride: 100 mmol/L (ref 98–110)
Creat: 1.75 mg/dL — ABNORMAL HIGH (ref 0.70–1.11)
GFR, EST AFRICAN AMERICAN: 40 mL/min/{1.73_m2} — AB (ref >=60)
GFR, EST NON AFRICAN AMERICAN: 35 mL/min/{1.73_m2} — AB (ref >=60)
Glucose, Bld: 250 mg/dL — ABNORMAL HIGH (ref 65–99)
Potassium: 4.9 mmol/L (ref 3.5–5.3)
Sodium: 135 mmol/L (ref 135–146)

## 2018-09-07 LAB — CK: Total CK: 65 U/L (ref 44–196)

## 2018-09-07 MED ORDER — VITAMIN D (ERGOCALCIFEROL) 1.25 MG (50000 UNIT) PO CAPS: 50000.0000 [IU] | ORAL_CAPSULE | ORAL | 3 refills | Status: DC

## 2018-09-07 NOTE — Patient Instructions (Addendum)
Reschedule physical exam to 12/12 or shortly after, call if you need me before, cancel Nov appt  fasting HBA1C,lipid.  cmp and EGFr 12/ 5 or shortly after  CK and chem 7 and EGFR today    Please reduce the insulin when you give it to 7 to 10 units   Commit to 3 times daily circuits in the house to get stronger and sharper

## 2018-09-08 DIAGNOSIS — M1991 Primary osteoarthritis, unspecified site: Secondary | ICD-10-CM | POA: Diagnosis not present

## 2018-09-08 DIAGNOSIS — W19XXXD Unspecified fall, subsequent encounter: Secondary | ICD-10-CM | POA: Diagnosis not present

## 2018-09-08 DIAGNOSIS — I129 Hypertensive chronic kidney disease with stage 1 through stage 4 chronic kidney disease, or unspecified chronic kidney disease: Secondary | ICD-10-CM | POA: Diagnosis not present

## 2018-09-08 DIAGNOSIS — E1122 Type 2 diabetes mellitus with diabetic chronic kidney disease: Secondary | ICD-10-CM | POA: Diagnosis not present

## 2018-09-08 DIAGNOSIS — F0280 Dementia in other diseases classified elsewhere without behavioral disturbance: Secondary | ICD-10-CM | POA: Diagnosis not present

## 2018-09-08 DIAGNOSIS — N400 Enlarged prostate without lower urinary tract symptoms: Secondary | ICD-10-CM | POA: Diagnosis not present

## 2018-09-08 DIAGNOSIS — M6282 Rhabdomyolysis: Secondary | ICD-10-CM | POA: Diagnosis not present

## 2018-09-08 DIAGNOSIS — N183 Chronic kidney disease, stage 3 (moderate): Secondary | ICD-10-CM | POA: Diagnosis not present

## 2018-09-08 DIAGNOSIS — G309 Alzheimer's disease, unspecified: Secondary | ICD-10-CM | POA: Diagnosis not present

## 2018-09-13 DIAGNOSIS — W19XXXD Unspecified fall, subsequent encounter: Secondary | ICD-10-CM | POA: Diagnosis not present

## 2018-09-13 DIAGNOSIS — I129 Hypertensive chronic kidney disease with stage 1 through stage 4 chronic kidney disease, or unspecified chronic kidney disease: Secondary | ICD-10-CM | POA: Diagnosis not present

## 2018-09-13 DIAGNOSIS — M6282 Rhabdomyolysis: Secondary | ICD-10-CM | POA: Diagnosis not present

## 2018-09-13 DIAGNOSIS — G309 Alzheimer's disease, unspecified: Secondary | ICD-10-CM | POA: Diagnosis not present

## 2018-09-13 DIAGNOSIS — N183 Chronic kidney disease, stage 3 (moderate): Secondary | ICD-10-CM | POA: Diagnosis not present

## 2018-09-13 DIAGNOSIS — M1991 Primary osteoarthritis, unspecified site: Secondary | ICD-10-CM | POA: Diagnosis not present

## 2018-09-13 DIAGNOSIS — N400 Enlarged prostate without lower urinary tract symptoms: Secondary | ICD-10-CM | POA: Diagnosis not present

## 2018-09-13 DIAGNOSIS — E1122 Type 2 diabetes mellitus with diabetic chronic kidney disease: Secondary | ICD-10-CM | POA: Diagnosis not present

## 2018-09-13 DIAGNOSIS — F0280 Dementia in other diseases classified elsewhere without behavioral disturbance: Secondary | ICD-10-CM | POA: Diagnosis not present

## 2018-09-15 DIAGNOSIS — M1991 Primary osteoarthritis, unspecified site: Secondary | ICD-10-CM | POA: Diagnosis not present

## 2018-09-15 DIAGNOSIS — W19XXXD Unspecified fall, subsequent encounter: Secondary | ICD-10-CM | POA: Diagnosis not present

## 2018-09-15 DIAGNOSIS — E1122 Type 2 diabetes mellitus with diabetic chronic kidney disease: Secondary | ICD-10-CM | POA: Diagnosis not present

## 2018-09-15 DIAGNOSIS — F0280 Dementia in other diseases classified elsewhere without behavioral disturbance: Secondary | ICD-10-CM | POA: Diagnosis not present

## 2018-09-15 DIAGNOSIS — I129 Hypertensive chronic kidney disease with stage 1 through stage 4 chronic kidney disease, or unspecified chronic kidney disease: Secondary | ICD-10-CM | POA: Diagnosis not present

## 2018-09-15 DIAGNOSIS — N183 Chronic kidney disease, stage 3 (moderate): Secondary | ICD-10-CM | POA: Diagnosis not present

## 2018-09-15 DIAGNOSIS — M6282 Rhabdomyolysis: Secondary | ICD-10-CM | POA: Diagnosis not present

## 2018-09-15 DIAGNOSIS — N400 Enlarged prostate without lower urinary tract symptoms: Secondary | ICD-10-CM | POA: Diagnosis not present

## 2018-09-15 DIAGNOSIS — G309 Alzheimer's disease, unspecified: Secondary | ICD-10-CM | POA: Diagnosis not present

## 2018-09-17 ENCOUNTER — Encounter: Payer: Self-pay | Admitting: Family Medicine

## 2018-09-17 NOTE — Progress Notes (Signed)
KAIL FRALEY     MRN: [id number]      DOB: Oct 03, 1931   HPI Mr. Mangel is here for follow up of recent hospitalization following a fall which resulted in rhabdo, and significant weakness. He was hospitalized from 09/05 to 08/08/2018, then discharged to rehab center for strengthening until 08/21/2018 and has been doing fairly well at home No recurrent falls and more mobile though still not as much as his son would like Appetite is good, bM regular, he has had no falls and blood sugar is falling a bit low log presented and tests twice daily ROS Denies recent fever or chills. Denies sinus pressure, nasal congestion, ear pain or sore throat. Denies chest congestion, productive cough or wheezing. Denies chest pains, palpitations and leg swelling Denies abdominal pain, nausea, vomiting,diarrhea or constipation.   Denies dysuria, frequency, hesitancy or incontinence. . Denies depression, anxiety or insomnia. Denies skin break down or rash.   PE  BP 130/70   Pulse 76   Resp 12   Ht 5\' 10"  (1.778 m)   Wt 262 lb 1.9 oz (118.9 kg)   SpO2 96% Comment: room air  BMI 37.61 kg/m   Patient alert and oriented and in no cardiopulmonary distress.  HEENT: No facial asymmetry, EOMI,   oropharynx pink and moist.  Neck decreased ROM no JVD, no mass.  Chest: Clear to auscultation bilaterally.Decreased air entry throughout  CVS: S1, S2 no murmurs, no S3.Regular rate.  ABD: Soft non tender.   Ext: No edema  MS: Decreased  ROM spine, shoulders, hips and knees.  Skin: Intact, no ulcerations or rash noted.  Psych: Good eye contact, normal affect. Memory impaired not anxious or depressed appearing.  CNS: CN 2-12 intact, power,  normal throughout.no focal deficits noted.   [facility] discharge follow-up Hospital course reviewed , recommended follow up carried through as far as labs. I stressed the need to increase safe mobility regularly to improve strength and  gait. Medication reviewed, insulin dose reduced based on log  Home safety reviewed   Rhabdomyolysis Rept lab ordered to re assess extent of persistent muscle damage  Diabetes mellitus, insulin dependent (IDDM), controlled ([place]) Over corrected with hypoglycemic episodes Reduce insulin dose Mr. Shetley is reminded of the importance of commitment to daily physical activity for 30 minutes or more, as able and the need to limit carbohydrate intake to 30 to 60 grams per meal to help with blood sugar control.   The need to take medication as prescribed, test blood sugar as directed, and to call between visits if there is a concern that blood sugar is uncontrolled is also discussed.   Mr. Schulenburg is reminded of the importance of daily foot exam, annual eye examination, and good blood sugar, blood pressure and cholesterol control.  Diabetic Labs Latest Ref Rng & Units 09/07/2018 08/17/2018 08/11/2018 08/10/2018 08/09/2018  HbA1c 4.8 - 5.6 % - - - - -  Microalbumin Not estab mg/dL - - - - -  Micro/Creat Ratio <30 mcg/mg creat - - - - -  Chol <200 mg/dL - - - - -  HDL >40 mg/dL - - - - -  Calc LDL mg/dL (calc) - - - - -  Triglycerides <150 mg/dL - - - - -  Creatinine 0.70 - 1.11 mg/dL 1.75(H) 1.64(H) 1.68(H) 1.70(H) 1.73(H)   BP/Weight 09/07/2018 08/28/2018 08/18/2018 08/17/2018 08/11/2018 08/11/2018 8/41/3244  Systolic BP 010 272 536 644 034 742 595  Diastolic BP 70 64 60  88 73 83 74  Wt. (Lbs) 262.12 262 - - - 253.53 -  BMI 37.61 37.59 - - - 36.38 -   Foot/eye exam completion dates Latest Ref Rng & Units 01/24/2018 09/22/2015  Eye Exam No Retinopathy - No Retinopathy  Foot Form Completion - Done -

## 2018-09-17 NOTE — Assessment & Plan Note (Signed)
Rept lab ordered to re assess extent of persistent muscle damage

## 2018-09-17 NOTE — Assessment & Plan Note (Signed)
Hospital course reviewed , recommended follow up carried through as far as labs. I stressed the need to increase safe mobility regularly to improve strength and gait. Medication reviewed, insulin dose reduced based on log  Home safety reviewed

## 2018-09-17 NOTE — Assessment & Plan Note (Signed)
Over corrected with hypoglycemic episodes Reduce insulin dose Samuel Ryan is reminded of the importance of commitment to daily physical activity for 30 minutes or more, as able and the need to limit carbohydrate intake to 30 to 60 grams per meal to help with blood sugar control.   The need to take medication as prescribed, test blood sugar as directed, and to call between visits if there is a concern that blood sugar is uncontrolled is also discussed.   Samuel Ryan is reminded of the importance of daily foot exam, annual eye examination, and good blood sugar, blood pressure and cholesterol control.  Diabetic Labs Latest Ref Rng & Units 09/07/2018 08/17/2018 08/11/2018 08/10/2018 08/09/2018  HbA1c 4.8 - 5.6 % - - - - -  Microalbumin Not estab mg/dL - - - - -  Micro/Creat Ratio <30 mcg/mg creat - - - - -  Chol <200 mg/dL - - - - -  HDL >40 mg/dL - - - - -  Calc LDL mg/dL (calc) - - - - -  Triglycerides <150 mg/dL - - - - -  Creatinine 0.70 - 1.11 mg/dL 1.75(H) 1.64(H) 1.68(H) 1.70(H) 1.73(H)   BP/Weight 09/07/2018 08/28/2018 08/18/2018 08/17/2018 08/11/2018 08/11/2018 4/56/2563  Systolic BP 893 734 287 681 157 262 035  Diastolic BP 70 64 60 88 73 83 74  Wt. (Lbs) 262.12 262 - - - 253.53 -  BMI 37.61 37.59 - - - 36.38 -   Foot/eye exam completion dates Latest Ref Rng & Units 01/24/2018 09/22/2015  Eye Exam No Retinopathy - No Retinopathy  Foot Form Completion - Done -

## 2018-09-18 ENCOUNTER — Other Ambulatory Visit: Payer: Self-pay | Admitting: Family Medicine

## 2018-09-19 DIAGNOSIS — G309 Alzheimer's disease, unspecified: Secondary | ICD-10-CM | POA: Diagnosis not present

## 2018-09-19 DIAGNOSIS — E1122 Type 2 diabetes mellitus with diabetic chronic kidney disease: Secondary | ICD-10-CM | POA: Diagnosis not present

## 2018-09-19 DIAGNOSIS — M1991 Primary osteoarthritis, unspecified site: Secondary | ICD-10-CM | POA: Diagnosis not present

## 2018-09-19 DIAGNOSIS — W19XXXD Unspecified fall, subsequent encounter: Secondary | ICD-10-CM | POA: Diagnosis not present

## 2018-09-19 DIAGNOSIS — N400 Enlarged prostate without lower urinary tract symptoms: Secondary | ICD-10-CM | POA: Diagnosis not present

## 2018-09-19 DIAGNOSIS — N183 Chronic kidney disease, stage 3 (moderate): Secondary | ICD-10-CM | POA: Diagnosis not present

## 2018-09-19 DIAGNOSIS — I129 Hypertensive chronic kidney disease with stage 1 through stage 4 chronic kidney disease, or unspecified chronic kidney disease: Secondary | ICD-10-CM | POA: Diagnosis not present

## 2018-09-19 DIAGNOSIS — M6282 Rhabdomyolysis: Secondary | ICD-10-CM | POA: Diagnosis not present

## 2018-09-19 DIAGNOSIS — F0280 Dementia in other diseases classified elsewhere without behavioral disturbance: Secondary | ICD-10-CM | POA: Diagnosis not present

## 2018-09-20 ENCOUNTER — Ambulatory Visit (INDEPENDENT_AMBULATORY_CARE_PROVIDER_SITE_OTHER): Payer: Medicare HMO | Admitting: Urology

## 2018-09-20 ENCOUNTER — Telehealth: Payer: Self-pay | Admitting: Family Medicine

## 2018-09-20 ENCOUNTER — Other Ambulatory Visit (HOSPITAL_COMMUNITY)
Admission: AD | Admit: 2018-09-20 | Discharge: 2018-09-20 | Disposition: A | Discharge status: Home / Self Care | Payer: Medicare HMO | Source: Skilled Nursing Facility | Attending: Internal Medicine | Admitting: Internal Medicine

## 2018-09-20 ENCOUNTER — Inpatient Hospital Stay (HOSPITAL_COMMUNITY): Admit: 2018-09-20 | Payer: Self-pay

## 2018-09-20 ENCOUNTER — Telehealth: Payer: Self-pay

## 2018-09-20 DIAGNOSIS — R3915 Urgency of urination: Secondary | ICD-10-CM | POA: Diagnosis not present

## 2018-09-20 DIAGNOSIS — N3000 Acute cystitis without hematuria: Secondary | ICD-10-CM

## 2018-09-20 DIAGNOSIS — N401 Enlarged prostate with lower urinary tract symptoms: Secondary | ICD-10-CM

## 2018-09-20 LAB — URINALYSIS, ROUTINE W REFLEX MICROSCOPIC
BILIRUBIN URINE: NEGATIVE
Glucose, UA: NEGATIVE mg/dL
Hgb urine dipstick: NEGATIVE
KETONES UR: NEGATIVE mg/dL
Leukocytes, UA: NEGATIVE
Nitrite: NEGATIVE
PROTEIN: NEGATIVE mg/dL
SPECIFIC GRAVITY, URINE: 1.012 (ref 1.005–1.030)
pH: 6 (ref 5.0–8.0)

## 2018-09-20 NOTE — Telephone Encounter (Signed)
Diabectic supplies  Meter, test strips., lancets, bd single swabs, accu solution, bd ultra fine 29 g pin needles

## 2018-09-20 NOTE — Telephone Encounter (Signed)
Son called in to state that Samuel Ryan has been slightly confused (which he sometimes is from time to time) and he is also very weak but he is taking physical therapy so he doesn't know if that's the reason or something else going on. Eddie Dibbles had appt with urology this am and the nurse came over and asked if we needed a UA sample. They collected one just incase you wanted [place] to order a UA/culture. They said they will hold it until they hear from [place] and if its not needed they will toss it.

## 2018-09-20 NOTE — Telephone Encounter (Signed)
pls order uA and C?S

## 2018-09-20 NOTE — Telephone Encounter (Signed)
Specimen sent.

## 2018-09-21 ENCOUNTER — Telehealth: Payer: Self-pay | Admitting: Family Medicine

## 2018-09-21 ENCOUNTER — Encounter (HOSPITAL_COMMUNITY): Payer: Self-pay

## 2018-09-21 ENCOUNTER — Emergency Department (HOSPITAL_COMMUNITY): Payer: Medicare HMO

## 2018-09-21 ENCOUNTER — Inpatient Hospital Stay (HOSPITAL_COMMUNITY)
Admission: EM | Admit: 2018-09-21 | Discharge: 2018-09-26 | DRG: 177 | Disposition: A | Discharge status: Skilled Nursing Facility | Payer: Medicare HMO | Attending: Internal Medicine | Admitting: Internal Medicine

## 2018-09-21 ENCOUNTER — Other Ambulatory Visit: Payer: Self-pay

## 2018-09-21 DIAGNOSIS — G309 Alzheimer's disease, unspecified: Secondary | ICD-10-CM | POA: Diagnosis present

## 2018-09-21 DIAGNOSIS — I471 Supraventricular tachycardia, unspecified: Secondary | ICD-10-CM

## 2018-09-21 DIAGNOSIS — R1310 Dysphagia, unspecified: Secondary | ICD-10-CM | POA: Diagnosis present

## 2018-09-21 DIAGNOSIS — G308 Other Alzheimer's disease: Secondary | ICD-10-CM | POA: Diagnosis not present

## 2018-09-21 DIAGNOSIS — R4182 Altered mental status, unspecified: Secondary | ICD-10-CM | POA: Diagnosis not present

## 2018-09-21 DIAGNOSIS — Z833 Family history of diabetes mellitus: Secondary | ICD-10-CM

## 2018-09-21 DIAGNOSIS — Z8701 Personal history of pneumonia (recurrent): Secondary | ICD-10-CM

## 2018-09-21 DIAGNOSIS — J698 Pneumonitis due to inhalation of other solids and liquids: Secondary | ICD-10-CM | POA: Diagnosis not present

## 2018-09-21 DIAGNOSIS — Z96651 Presence of right artificial knee joint: Secondary | ICD-10-CM | POA: Diagnosis present

## 2018-09-21 DIAGNOSIS — M1990 Unspecified osteoarthritis, unspecified site: Secondary | ICD-10-CM | POA: Diagnosis present

## 2018-09-21 DIAGNOSIS — I6523 Occlusion and stenosis of bilateral carotid arteries: Secondary | ICD-10-CM | POA: Diagnosis not present

## 2018-09-21 DIAGNOSIS — Z881 Allergy status to other antibiotic agents status: Secondary | ICD-10-CM

## 2018-09-21 DIAGNOSIS — Z794 Long term (current) use of insulin: Secondary | ICD-10-CM | POA: Diagnosis not present

## 2018-09-21 DIAGNOSIS — E871 Hypo-osmolality and hyponatremia: Secondary | ICD-10-CM | POA: Diagnosis not present

## 2018-09-21 DIAGNOSIS — G9341 Metabolic encephalopathy: Secondary | ICD-10-CM | POA: Diagnosis not present

## 2018-09-21 DIAGNOSIS — Z888 Allergy status to other drugs, medicaments and biological substances status: Secondary | ICD-10-CM

## 2018-09-21 DIAGNOSIS — N183 Chronic kidney disease, stage 3 unspecified: Secondary | ICD-10-CM | POA: Diagnosis present

## 2018-09-21 DIAGNOSIS — T443X5A Adverse effect of other parasympatholytics [anticholinergics and antimuscarinics] and spasmolytics, initial encounter: Secondary | ICD-10-CM | POA: Diagnosis present

## 2018-09-21 DIAGNOSIS — J9600 Acute respiratory failure, unspecified whether with hypoxia or hypercapnia: Secondary | ICD-10-CM | POA: Diagnosis not present

## 2018-09-21 DIAGNOSIS — J9811 Atelectasis: Secondary | ICD-10-CM | POA: Diagnosis not present

## 2018-09-21 DIAGNOSIS — Z8249 Family history of ischemic heart disease and other diseases of the circulatory system: Secondary | ICD-10-CM

## 2018-09-21 DIAGNOSIS — I129 Hypertensive chronic kidney disease with stage 1 through stage 4 chronic kidney disease, or unspecified chronic kidney disease: Secondary | ICD-10-CM | POA: Diagnosis not present

## 2018-09-21 DIAGNOSIS — E860 Dehydration: Secondary | ICD-10-CM | POA: Diagnosis present

## 2018-09-21 DIAGNOSIS — F0280 Dementia in other diseases classified elsewhere without behavioral disturbance: Secondary | ICD-10-CM | POA: Diagnosis not present

## 2018-09-21 DIAGNOSIS — W19XXXD Unspecified fall, subsequent encounter: Secondary | ICD-10-CM | POA: Diagnosis not present

## 2018-09-21 DIAGNOSIS — M79662 Pain in left lower leg: Secondary | ICD-10-CM | POA: Diagnosis not present

## 2018-09-21 DIAGNOSIS — K219 Gastro-esophageal reflux disease without esophagitis: Secondary | ICD-10-CM | POA: Diagnosis present

## 2018-09-21 DIAGNOSIS — M6281 Muscle weakness (generalized): Secondary | ICD-10-CM | POA: Diagnosis not present

## 2018-09-21 DIAGNOSIS — M6282 Rhabdomyolysis: Secondary | ICD-10-CM | POA: Diagnosis not present

## 2018-09-21 DIAGNOSIS — E559 Vitamin D deficiency, unspecified: Secondary | ICD-10-CM | POA: Diagnosis not present

## 2018-09-21 DIAGNOSIS — D649 Anemia, unspecified: Secondary | ICD-10-CM | POA: Diagnosis not present

## 2018-09-21 DIAGNOSIS — R7989 Other specified abnormal findings of blood chemistry: Secondary | ICD-10-CM | POA: Diagnosis not present

## 2018-09-21 DIAGNOSIS — E119 Type 2 diabetes mellitus without complications: Secondary | ICD-10-CM | POA: Diagnosis not present

## 2018-09-21 DIAGNOSIS — D631 Anemia in chronic kidney disease: Secondary | ICD-10-CM | POA: Diagnosis present

## 2018-09-21 DIAGNOSIS — D689 Coagulation defect, unspecified: Secondary | ICD-10-CM | POA: Diagnosis not present

## 2018-09-21 DIAGNOSIS — Z981 Arthrodesis status: Secondary | ICD-10-CM | POA: Diagnosis not present

## 2018-09-21 DIAGNOSIS — F0390 Unspecified dementia without behavioral disturbance: Secondary | ICD-10-CM

## 2018-09-21 DIAGNOSIS — I10 Essential (primary) hypertension: Secondary | ICD-10-CM | POA: Diagnosis present

## 2018-09-21 DIAGNOSIS — N400 Enlarged prostate without lower urinary tract symptoms: Secondary | ICD-10-CM | POA: Diagnosis not present

## 2018-09-21 DIAGNOSIS — J9601 Acute respiratory failure with hypoxia: Secondary | ICD-10-CM

## 2018-09-21 DIAGNOSIS — R0902 Hypoxemia: Secondary | ICD-10-CM | POA: Diagnosis present

## 2018-09-21 DIAGNOSIS — G300 Alzheimer's disease with early onset: Secondary | ICD-10-CM | POA: Diagnosis not present

## 2018-09-21 DIAGNOSIS — E1122 Type 2 diabetes mellitus with diabetic chronic kidney disease: Secondary | ICD-10-CM | POA: Diagnosis present

## 2018-09-21 DIAGNOSIS — E782 Mixed hyperlipidemia: Secondary | ICD-10-CM | POA: Diagnosis present

## 2018-09-21 DIAGNOSIS — M79606 Pain in leg, unspecified: Secondary | ICD-10-CM

## 2018-09-21 DIAGNOSIS — E1165 Type 2 diabetes mellitus with hyperglycemia: Secondary | ICD-10-CM | POA: Diagnosis not present

## 2018-09-21 DIAGNOSIS — J8410 Pulmonary fibrosis, unspecified: Secondary | ICD-10-CM | POA: Diagnosis not present

## 2018-09-21 DIAGNOSIS — I5030 Unspecified diastolic (congestive) heart failure: Secondary | ICD-10-CM | POA: Diagnosis not present

## 2018-09-21 DIAGNOSIS — I639 Cerebral infarction, unspecified: Secondary | ICD-10-CM

## 2018-09-21 DIAGNOSIS — I4510 Unspecified right bundle-branch block: Secondary | ICD-10-CM | POA: Diagnosis present

## 2018-09-21 DIAGNOSIS — Z880 Allergy status to penicillin: Secondary | ICD-10-CM

## 2018-09-21 DIAGNOSIS — M79661 Pain in right lower leg: Secondary | ICD-10-CM | POA: Diagnosis not present

## 2018-09-21 DIAGNOSIS — R410 Disorientation, unspecified: Secondary | ICD-10-CM | POA: Diagnosis not present

## 2018-09-21 DIAGNOSIS — Z79899 Other long term (current) drug therapy: Secondary | ICD-10-CM

## 2018-09-21 DIAGNOSIS — J690 Pneumonitis due to inhalation of food and vomit: Secondary | ICD-10-CM | POA: Diagnosis not present

## 2018-09-21 DIAGNOSIS — IMO0002 Reserved for concepts with insufficient information to code with codable children: Secondary | ICD-10-CM

## 2018-09-21 DIAGNOSIS — Z86711 Personal history of pulmonary embolism: Secondary | ICD-10-CM

## 2018-09-21 DIAGNOSIS — E1121 Type 2 diabetes mellitus with diabetic nephropathy: Secondary | ICD-10-CM

## 2018-09-21 DIAGNOSIS — M1991 Primary osteoarthritis, unspecified site: Secondary | ICD-10-CM | POA: Diagnosis not present

## 2018-09-21 LAB — CBC WITH DIFFERENTIAL/PLATELET
Abs Immature Granulocytes: 0.03 10*3/uL (ref 0.00–0.07)
BASOS PCT: 0 %
Basophils Absolute: 0 10*3/uL (ref 0.0–0.1)
EOS ABS: 0.2 10*3/uL (ref 0.0–0.5)
Eosinophils Relative: 3 %
HCT: 27.8 % — ABNORMAL LOW (ref 39.0–52.0)
Hemoglobin: 8.7 g/dL — ABNORMAL LOW (ref 13.0–17.0)
Immature Granulocytes: 1 %
Lymphocytes Relative: 26 %
Lymphs Abs: 1.4 10*3/uL (ref 0.7–4.0)
MCH: 30.5 pg (ref 26.0–34.0)
MCHC: 31.3 g/dL (ref 30.0–36.0)
MCV: 97.5 fL (ref 80.0–100.0)
MONO ABS: 0.7 10*3/uL (ref 0.1–1.0)
MONOS PCT: 13 %
Neutro Abs: 3.1 10*3/uL (ref 1.7–7.7)
Neutrophils Relative %: 57 %
PLATELETS: 264 10*3/uL (ref 150–400)
RBC: 2.85 MIL/uL — AB (ref 4.22–5.81)
RDW: 13.6 % (ref 11.5–15.5)
WBC: 5.5 10*3/uL (ref 4.0–10.5)
nRBC: 0 % (ref 0.0–0.2)

## 2018-09-21 LAB — COMPREHENSIVE METABOLIC PANEL
ALT: 13 U/L (ref 0–44)
ANION GAP: 6 (ref 5–15)
AST: 16 U/L (ref 15–41)
Albumin: 2.8 g/dL — ABNORMAL LOW (ref 3.5–5.0)
Alkaline Phosphatase: 65 U/L (ref 38–126)
BUN: 21 mg/dL (ref 8–23)
CHLORIDE: 103 mmol/L (ref 98–111)
CO2: 24 mmol/L (ref 22–32)
CREATININE: 1.85 mg/dL — AB (ref 0.61–1.24)
Calcium: 8.1 mg/dL — ABNORMAL LOW (ref 8.9–10.3)
GFR calc Af Amer: 37 mL/min — ABNORMAL LOW (ref >60)
GFR, EST NON AFRICAN AMERICAN: 32 mL/min — AB (ref >60)
Glucose, Bld: 300 mg/dL — ABNORMAL HIGH (ref 70–99)
POTASSIUM: 4.8 mmol/L (ref 3.5–5.1)
Sodium: 133 mmol/L — ABNORMAL LOW (ref 135–145)
Total Bilirubin: 0.6 mg/dL (ref 0.3–1.2)
Total Protein: 7.2 g/dL (ref 6.5–8.1)

## 2018-09-21 LAB — TROPONIN I

## 2018-09-21 LAB — BRAIN NATRIURETIC PEPTIDE: B NATRIURETIC PEPTIDE 5: 120 pg/mL — AB (ref 0.0–100.0)

## 2018-09-21 MED ORDER — INSULIN ASPART 100 UNIT/ML ~~LOC~~ SOLN
0.0000 [IU] | Freq: Every day | SUBCUTANEOUS | Status: DC
  Administered 2018-09-22: 2 [IU] via SUBCUTANEOUS

## 2018-09-21 MED ORDER — SODIUM CHLORIDE 0.9 % IV SOLN: 250.0000 mL | INTRAVENOUS | Status: DC | PRN

## 2018-09-21 MED ORDER — INSULIN ASPART 100 UNIT/ML ~~LOC~~ SOLN
0.0000 [IU] | Freq: Three times a day (TID) | SUBCUTANEOUS | Status: DC
  Administered 2018-09-22: 2 [IU] via SUBCUTANEOUS
  Administered 2018-09-22 – 2018-09-23 (×2): 1 [IU] via SUBCUTANEOUS
  Administered 2018-09-23: 2 [IU] via SUBCUTANEOUS
  Administered 2018-09-23: 1 [IU] via SUBCUTANEOUS
  Administered 2018-09-24: 2 [IU] via SUBCUTANEOUS
  Administered 2018-09-24: 1 [IU] via SUBCUTANEOUS
  Administered 2018-09-24: 2 [IU] via SUBCUTANEOUS
  Administered 2018-09-25: 1 [IU] via SUBCUTANEOUS
  Administered 2018-09-25 – 2018-09-26 (×3): 2 [IU] via SUBCUTANEOUS

## 2018-09-21 MED ORDER — SODIUM CHLORIDE 0.9% FLUSH
3.0000 mL | Freq: Two times a day (BID) | INTRAVENOUS | Status: DC
  Administered 2018-09-22 – 2018-09-25 (×5): 3 mL via INTRAVENOUS

## 2018-09-21 MED ORDER — ACETAMINOPHEN 325 MG PO TABS
650.0000 mg | ORAL_TABLET | Freq: Four times a day (QID) | ORAL | Status: DC | PRN
  Administered 2018-09-24 – 2018-09-26 (×2): 650 mg via ORAL
  Filled 2018-09-21 (×2): qty 2

## 2018-09-21 MED ORDER — SODIUM CHLORIDE 0.9% FLUSH: 3.0000 mL | INTRAVENOUS | Status: DC | PRN

## 2018-09-21 MED ORDER — ACETAMINOPHEN 650 MG RE SUPP: 650.0000 mg | Freq: Four times a day (QID) | RECTAL | Status: DC | PRN

## 2018-09-21 NOTE — Telephone Encounter (Signed)
Already sent, pls review with patient

## 2018-09-21 NOTE — Telephone Encounter (Signed)
Magda Paganini with [facility] is calling to advise that the PT BP 140/78, his resting heart rate is 110/120, no temp

## 2018-09-21 NOTE — Telephone Encounter (Signed)
Cell is [phone]

## 2018-09-21 NOTE — Telephone Encounter (Signed)
FYI

## 2018-09-21 NOTE — ED Triage Notes (Addendum)
Family reports weakness for over a week. Today a U/A was performed by Dr Rose Fillers yesterday and urine was clear. Son re[ports increase confusion for 5 days. Poor appetite.  Denies N/V/D. Son reports pt seems tired and cbg's have been higher than normal

## 2018-09-21 NOTE — Telephone Encounter (Signed)
Spoke with son re urine result which is totally normal. Son states Father is weak and confused , and blood sugarfs arfe within regular range I advised ED eval

## 2018-09-21 NOTE — Telephone Encounter (Signed)
Son is calling , his Dad is still weak and confused, wants to know what the urine showed

## 2018-09-21 NOTE — Telephone Encounter (Signed)
Could you please advise on recent test results.

## 2018-09-21 NOTE — ED Triage Notes (Signed)
Family member states he has not been quit himself for the last few days, answers questions appropriately in triage.

## 2018-09-21 NOTE — ED Provider Notes (Signed)
[facility] EMERGENCY DEPARTMENT Provider Note   CSN: [id number] Arrival date & time: 09/21/18  1800     History   Chief Complaint Chief Complaint  Patient presents with  . Altered Mental Status    HPI Samuel Ryan is a 84 y.o. male.  HPI History is limited due to dementia.  Son at bedside.  Has had increased forgetfulness and confusion over the last 5 days.  Decreased oral intake.  Mild nonproductive cough.  UA performed today at Dr. Griffin Dakin office which was normal.  Advised to come to the emergency department. Past Medical History:  Diagnosis Date  . Acute cholecystitis 11/2012   Drained percutaneously  . Anxiety   . BPH (benign prostatic hypertrophy)   . Deep venous thrombosis (HCC)    Left leg, diagnosed 7/12  . Dementia [facility])    Needs supervision for safety per son - POA  . Diverticulitis   . Essential hypertension, benign   . GERD (gastroesophageal reflux disease)   . History of pneumonia    Prior to 2012  . Incontinence of urine   . Mixed hyperlipidemia   . Osteoarthritis   . Pulmonary embolism ([place])    Diagnosed 7/12  . Type 2 diabetes mellitus [facility])     Patient Active Problem List   Diagnosis Date Noted  . Acute metabolic encephalopathy 15/17/6160  . Acute respiratory failure with hypoxia ([place]) 09/22/2018  . Uncontrolled type 2 diabetes mellitus with hyperglycemia ([place]) 09/22/2018  . Dementia without behavioral disturbance ([place]) 09/22/2018  . Fall 08/10/2018  . Rhabdomyolysis 08/03/2018  . Morbid obesity ([place]) 06/12/2018  . Anemia 09/14/2017  . CKD (chronic kidney disease) stage 3, GFR 30-59 ml/min (HCC) 09/14/2017  . Hyponatremia 09/14/2017  . Atrial fibrillation ([place]) 09/14/2017  . Hospital discharge follow-up 11/26/2016  . Hypoalbuminemia 11/23/2016  . Hypoxia 10/29/2016  . Urinary frequency 09/17/2015  . At high risk for falls 03/30/2015  . Generalized osteoarthritis 07/16/2014  . Urinary incontinence 07/16/2014  . Pulmonary  interstitial fibrosis ([place]) 07/03/2014  . Abnormal CXR (chest x-ray) 05/27/2014  . Thyroid mass of unclear etiology 05/07/2014  . Vitamin D deficiency 01/31/2014  . Allergic rhinitis 01/31/2014  . Diverticulosis of sigmoid colon 12/13/2012  . Right ventricular dysfunction 07/20/2011  . Heart disease, unspecified 07/20/2011  . Alzheimer's dementia without behavioral disturbance ([place]) 09/12/2010  . Goiter 06/19/2009  . Essential hypertension 12/11/2008  . Diabetes mellitus, insulin dependent (IDDM), controlled ([place]) 09/05/2008  . Hyperlipidemia with target LDL less than 100 07/19/2008  . BPH (benign prostatic hyperplasia) 03/28/2008    Past Surgical History:  Procedure Laterality Date  . CARPAL TUNNEL RELEASE    . COLONOSCOPY  08/31/2012   Procedure: COLONOSCOPY;  Surgeon: Rogene Houston, MD;  Location: [facility] ENDO SUITE;  Service: Endoscopy;  Laterality: N/A;  830  . CYSTOSCOPY WITH INJECTION N/A 05/15/2018   Procedure: CYSTOSCOPY WITH BOTOX INJECTION;  Surgeon: Cleon Gustin, MD;  Location: [facility] ORS;  Service: Urology;  Laterality: N/A;  . CYSTOSCOPY WITH INSERTION OF UROLIFT N/A 09/02/2016   Procedure: CYSTOSCOPY WITH INSERTION OF UROLIFT;  Surgeon: Cleon Gustin, MD;  Location: [facility];  Service: Urology;  Laterality: N/A;  . EYE SURGERY Bilateral    bilateral cataract  . Right total knee replacement  04/23/2011  . SPINE SURGERY  prior to 2012   Neck fusion  . UMBILICAL HERNIA REPAIR     umbilical        Home Medications    Prior  to Admission medications   Medication Sig Start Date End Date Taking? Authorizing Provider  acetaminophen (TYLENOL) 500 MG tablet Take 1,000 mg by mouth every 6 (six) hours as needed for mild pain, moderate pain or headache.    Yes [provider]  Carboxymeth-Glycerin-Polysorb 0.5-1-0.5 % SOLN Place 1 drop into both eyes 4 (four) times daily as needed (for dry eye relief).    Yes [provider]  donepezil  (ARICEPT) 10 MG tablet TAKE 1 TABLET AT BEDTIME Patient taking differently: Take 10 mg by mouth at bedtime.  03/27/18  Yes [name], MD  FEROSUL 325 (65 Fe) MG tablet TAKE 1 TABLET TWICE DAILY Patient taking differently: Take 325 mg by mouth daily with breakfast.  03/27/18  Yes [name], MD  glipiZIDE (GLUCOTROL XL) 10 MG 24 hr tablet Take 1 tablet (10 mg total) by mouth daily with breakfast. 06/06/18  Yes [name], MD  Insulin Glargine (LANTUS SOLOSTAR) 100 UNIT/ML Solostar Pen Inject 10-20 Units into the skin every morning. HOLD for blood sugar levels below 100 06/06/18  Yes [name], MD  loratadine (CLARITIN) 10 MG tablet Take 10 mg by mouth every morning.    Yes [provider]  lovastatin (MEVACOR) 40 MG tablet TAKE 1 TABLET AT BEDTIME Patient taking differently: Take 40 mg by mouth at bedtime.  03/27/18  Yes [name], MD  omeprazole (PRILOSEC) 40 MG capsule TAKE 1 CAPSULE EVERY DAY Patient taking differently: Take 40 mg by mouth every morning.  03/27/18  Yes [name], MD  sertraline (ZOLOFT) 50 MG tablet TAKE 1 TABLET EVERY DAY Patient taking differently: Take 50 mg by mouth daily.  09/19/18  Yes [name], MD  tamsulosin (FLOMAX) 0.4 MG CAPS capsule TAKE 1 CAPSULE EVERY DAY Patient taking differently: Take 0.4 mg by mouth every morning.  03/27/18  Yes [name], MD  traMADol (ULTRAM) 50 MG tablet Take 50 mg by mouth 2 (two) times daily.   Yes [provider]  Trospium Chloride 60 MG CP24 Take 60 mg by mouth daily.  06/02/17  Yes [provider]  Vitamin D, Ergocalciferol, (DRISDOL) 50000 units CAPS capsule Take 1 capsule (50,000 Units total) by mouth every 7 (seven) days. 09/07/18  Yes [name], MD  Blood Glucose Monitoring Suppl (TRUE METRIX METER) w/Device KIT With supplies, alcohol swabs as covered by insurance 09/22/18   [name], MD  glucose blood (TRUE METRIX  BLOOD GLUCOSE TEST) test strip TEST ONCE DAILY dx e11.9 06/07/18   [name], MD  Insulin Pen Needle (PEN NEEDLES) 31G X 5 MM MISC 1 each by Does not apply route as directed. Use to inject lantus daily dx E11.65 02/14/18   [name], MD  Insulin Pen Needle 29G X 5MM MISC 1 each by Does not apply route as directed. 09/22/18   [name], MD    Family History Family History  Problem Relation Age of Onset  . Cancer Mother        Stomach  . Diabetes Sister   . Hypertension Sister   . Diabetes Brother   . Cancer Brother        Gallbladder  . Seizures Son        due to meningitis as a child    Social History Social History   Tobacco Use  . Smoking status: Never Smoker  . Smokeless tobacco: Never Used  Substance Use Topics  .  Alcohol use: No  . Drug use: No     Allergies   Zosyn [piperacillin sod-tazobactam so]; Ace inhibitors; and Vancomycin   Review of Systems Review of Systems  Unable to perform ROS: Dementia     Physical Exam Updated Vital Signs BP 130/77   Pulse 75   Temp 98.4 F (36.9 C) (Oral)   Resp 18   Wt 112.6 kg   SpO2 100%   BMI 35.62 kg/m   Physical Exam  Constitutional: He appears well-developed and well-nourished. No distress.  HENT:  Head: Normocephalic and atraumatic.  Mouth/Throat: Oropharynx is clear and moist. No oropharyngeal exudate.  Cranial nerves II through XII grossly intact.  Eyes: Pupils are equal, round, and reactive to light. EOM are normal.  Neck: Normal range of motion. Neck supple. No JVD present.  No meningismus.  Cardiovascular: Normal rate and regular rhythm. Exam reveals no gallop and no friction rub.  No murmur heard. Pulmonary/Chest: Effort normal. He has rales.   Diminished breath sounds and scattered rhonchi especially in the left base.  Abdominal: Soft. Bowel sounds are normal. There is no tenderness. There is no rebound and no guarding.  Musculoskeletal: Normal range of motion. He  exhibits no edema or tenderness.  No midline thoracic or lumbar tenderness.  No CVA tenderness.  Lymphadenopathy:    He has no cervical adenopathy.  Neurological: He is alert.  4/5 motor in left lower extremity.  5/5 motor in all extremities.  Sensation to light touch intact.  Skin: Skin is warm and dry. No rash noted. He is not diaphoretic. No erythema.  Psychiatric: He has a normal mood and affect. His behavior is normal.  Mild memory impairment.  Slow to answer questions.  Nursing note and vitals reviewed.    ED Treatments / Results  Labs (all labs ordered are listed, but only abnormal results are displayed) Labs Reviewed  CBC WITH DIFFERENTIAL/PLATELET - Abnormal; Notable for the following components:      Result Value   RBC 2.85 (*)    Hemoglobin 8.7 (*)    HCT 27.8 (*)    All other components within normal limits  COMPREHENSIVE METABOLIC PANEL - Abnormal; Notable for the following components:   Sodium 133 (*)    Glucose, Bld 300 (*)    Creatinine, Ser 1.85 (*)    Calcium 8.1 (*)    Albumin 2.8 (*)    GFR calc non Af Amer 32 (*)    GFR calc Af Amer 37 (*)    All other components within normal limits  BRAIN NATRIURETIC PEPTIDE - Abnormal; Notable for the following components:   B Natriuretic Peptide 120.0 (*)    All other components within normal limits  D-DIMER, QUANTITATIVE (NOT AT [facility]) - Abnormal; Notable for the following components:   D-Dimer, Quant 13.48 (*)    All other components within normal limits  BLOOD GAS, ARTERIAL - Abnormal; Notable for the following components:   pO2, Arterial 78.2 (*)    Acid-Base Excess 2.1 (*)    All other components within normal limits  COMPREHENSIVE METABOLIC PANEL - Abnormal; Notable for the following components:   Glucose, Bld 116 (*)    Creatinine, Ser 1.77 (*)    Calcium 8.4 (*)    Albumin 2.9 (*)    GFR calc non Af Amer 33 (*)    GFR calc Af Amer 39 (*)    All other components within normal limits  CBC - Abnormal;  Notable for the following components:  RBC 3.61 (*)    Hemoglobin 10.6 (*)    HCT 34.3 (*)    All other components within normal limits  APTT - Abnormal; Notable for the following components:   aPTT 144 (*)    All other components within normal limits  HEPARIN LEVEL (UNFRACTIONATED) - Abnormal; Notable for the following components:   Heparin Unfractionated 0.12 (*)    All other components within normal limits  GLUCOSE, CAPILLARY - Abnormal; Notable for the following components:   Glucose-Capillary 100 (*)    All other components within normal limits  FERRITIN - Abnormal; Notable for the following components:   Ferritin 592 (*)    All other components within normal limits  GLUCOSE, CAPILLARY - Abnormal; Notable for the following components:   Glucose-Capillary 162 (*)    All other components within normal limits  GLUCOSE, CAPILLARY - Abnormal; Notable for the following components:   Glucose-Capillary 142 (*)    All other components within normal limits  BASIC METABOLIC PANEL - Abnormal; Notable for the following components:   Glucose, Bld 161 (*)    Creatinine, Ser 1.89 (*)    Calcium 8.3 (*)    GFR calc non Af Amer 31 (*)    GFR calc Af Amer 36 (*)    All other components within normal limits  GLUCOSE, CAPILLARY - Abnormal; Notable for the following components:   Glucose-Capillary 164 (*)    All other components within normal limits  GLUCOSE, CAPILLARY - Abnormal; Notable for the following components:   Glucose-Capillary 145 (*)    All other components within normal limits  GLUCOSE, CAPILLARY - Abnormal; Notable for the following components:   Glucose-Capillary 150 (*)    All other components within normal limits  GLUCOSE, CAPILLARY - Abnormal; Notable for the following components:   Glucose-Capillary 182 (*)    All other components within normal limits  GLUCOSE, CAPILLARY - Abnormal; Notable for the following components:   Glucose-Capillary 185 (*)    All other  components within normal limits  CBG MONITORING, ED - Abnormal; Notable for the following components:   Glucose-Capillary 230 (*)    All other components within normal limits  RESPIRATORY PANEL BY PCR  TROPONIN I  TROPONIN I  TROPONIN I  TROPONIN I  PROTIME-INR  PROTIME-INR  VITAMIN B12  FOLATE  AMMONIA  CK  TSH  IRON AND TIBC  LIPID PANEL  BASIC METABOLIC PANEL  MAGNESIUM    EKG EKG Interpretation  Date/Time:  Thursday September 21 2018 18:16:25 EDT Ventricular Rate:  79 PR Interval:    QRS Duration: 138 QT Interval:  393 QTC Calculation: 451 R Axis:   96 Text Interpretation:  Sinus rhythm Right bundle branch block Minimal ST elevation, inferior leads No significant change was found Confirmed by Jola Schmidt [phone]) on 09/22/2018 10:36:15 AM   Radiology Ct Chest Wo Contrast  Result Date: 09/23/2018 CLINICAL DATA:  Acute respiratory failure, hypoxia EXAM: CT CHEST WITHOUT CONTRAST TECHNIQUE: Multidetector CT imaging of the chest was performed following the standard protocol without IV contrast. COMPARISON:  Chest radiographs dated 09/21/2018. CTA chest dated 08/09/2018. FINDINGS: Cardiovascular: The heart is top-normal in size. No pericardial effusion. No evidence of thoracic aortic aneurysm. Atherosclerotic calcifications of the aortic arch. Mild coronary atherosclerosis of the LAD. Mediastinum/Nodes: Small mediastinal lymph nodes, including an 11 mm short axis AP window node, likely reactive. Left thyroid is enlarged/nodular, within aggregate 3.1 cm left thyroid nodule (series 2/image 26), unchanged from 2015. Lungs/Pleura: Evaluation of the lung  parenchyma is constrained by respiratory motion. Scarring in the right middle lobe, lingula, and bilateral lower lobes, chronic. Mild patchy/ground-glass opacities in the medial left upper lobe (series 4/image 45) and lateral left upper lobe/lingula (series 4/image 59), suggesting mild infection. 5 mm triangular nodule in the right  upper and middle lobes (series 4/images 71 and 82), unchanged from 2015, benign. Suspected mild centrilobular emphysematous changes. No pleural effusion or pneumothorax. Upper Abdomen: Visualized upper abdomen is grossly unremarkable. Musculoskeletal: Degenerative changes of the visualized thoracolumbar spine. Cervical spine fixation hardware, incompletely visualized. IMPRESSION: Mild patchy/ground-glass opacities in the left upper lobe/lingula, suggesting mild infection. Underlying chronic scarring in the right middle lobe, lingula, and bilateral lower lobes. Aortic Atherosclerosis (ICD10-I70.0) and Emphysema (ICD10-J43.9). Electronically Signed   By: Julian Hy M.D.   On: 09/23/2018 16:32   US Carotid Bilateral  Result Date: 09/23/2018 CLINICAL DATA:  Somnolence and encephalopathy. History of dementia, hypertension, hyperlipidemia and diabetes. EXAM: BILATERAL CAROTID DUPLEX ULTRASOUND TECHNIQUE: Pearline Cables scale imaging, color Doppler and duplex ultrasound were performed of bilateral carotid and vertebral arteries in the neck. COMPARISON:  None. FINDINGS: Criteria: Quantification of carotid stenosis is based on velocity parameters that correlate the residual internal carotid diameter with NASCET-based stenosis levels, using the diameter of the distal internal carotid lumen as the denominator for stenosis measurement. The following velocity measurements were obtained: RIGHT ICA:  75/25 cm/sec CCA:  16/94 cm/sec SYSTOLIC ICA/CCA RATIO:  1.0 ECA:  84 cm/sec LEFT ICA:  64/21 cm/sec CCA:  50/38 cm/sec SYSTOLIC ICA/CCA RATIO:  0.7 ECA:  94 cm/sec RIGHT CAROTID ARTERY: Minimal calcified plaque in the common carotid artery. Moderate eccentric plaque in the carotid bulb. Mild calcified plaque at the right ICA origin. Velocities and waveforms are normal and estimated right ICA stenosis is less than 50%. RIGHT VERTEBRAL ARTERY: Limited ability to visualize the vertebral artery due to patient positioning. Sampling was  therefore limited. There appears to be likely antegrade flow. LEFT CAROTID ARTERY: There is a mild amount of calcified plaque at the level of the left carotid bulb and proximal left ICA. Estimated left ICA stenosis is less than 50%. LEFT VERTEBRAL ARTERY: The left vertebral artery was not visualized, likely mostly due to patient positioning. Left vertebral artery occlusion cannot be excluded by the current study. IMPRESSION: 1. Plaque at the level of both carotid bulbs and proximal internal carotid arteries without significant stenosis identified. Estimated bilateral ICA stenoses are less than 50%. 2. Poor visualization of both vertebral arteries. This is felt to be mostly due to patient positioning. Left vertebral artery occlusion cannot be excluded due to lack of visualization. Electronically Signed   By: Aletta Edouard M.D.   On: 09/23/2018 13:15   Nm Pulmonary Perf And Vent  Result Date: 09/22/2018 CLINICAL DATA:  Shortness of breath, weakness, elevated D-dimer, suspected pulmonary embolism EXAM: NUCLEAR MEDICINE VENTILATION - PERFUSION LUNG SCAN TECHNIQUE: Ventilation images were obtained in multiple projections using inhaled aerosol Tc-65mDTPA. Perfusion images were obtained in multiple projections after intravenous injection of Tc-932mAA. RADIOPHARMACEUTICALS:  33 mCi of Tc-9961mPA aerosol inhalation and 4.4 mCi Tc99m37m IV COMPARISON:  None Correlation: Chest radiograph 09/21/2018 FINDINGS: Ventilation: Central airway deposition of aerosol. Poor peripheral distribution of aerosol to upper lobes. Diminished ventilation LEFT lower lobe medially. Perfusion: No segmental or subsegmental perfusion defects. Minimal irregularity of perfusion at the posterior RIGHT lung, nonsegmental. IMPRESSION: Very low probability for pulmonary embolism. Electronically Signed   By: MarkLavonia Dana.   On: 09/22/2018 09:54  US Venous Img Lower Bilateral  Result Date: 09/22/2018 CLINICAL DATA:  Bilateral lower  extremity pain. EXAM: BILATERAL LOWER EXTREMITY VENOUS DOPPLER ULTRASOUND TECHNIQUE: Gray-scale sonography with graded compression, as well as color Doppler and duplex ultrasound were performed to evaluate the lower extremity deep venous systems from the level of the common femoral vein and including the common femoral, femoral, profunda femoral, popliteal and calf veins including the posterior tibial, peroneal and gastrocnemius veins when visible. The superficial great saphenous vein was also interrogated. Spectral Doppler was utilized to evaluate flow at rest and with distal augmentation maneuvers in the common femoral, femoral and popliteal veins. COMPARISON:  None. FINDINGS: RIGHT LOWER EXTREMITY Common Femoral Vein: No evidence of thrombus. Normal compressibility, respiratory phasicity and response to augmentation. Saphenofemoral Junction: No evidence of thrombus. Normal compressibility and flow on color Doppler imaging. Profunda Femoral Vein: No evidence of thrombus. Normal compressibility and flow on color Doppler imaging. Femoral Vein: No evidence of thrombus. Normal compressibility, respiratory phasicity and response to augmentation. Popliteal Vein: No evidence of thrombus. Normal compressibility, respiratory phasicity and response to augmentation. Calf Veins: No evidence of thrombus. Normal compressibility and flow on color Doppler imaging. Superficial Great Saphenous Vein: No evidence of thrombus. Normal compressibility. Venous Reflux:  None. Other Findings:  None. LEFT LOWER EXTREMITY Common Femoral Vein: No evidence of thrombus. Normal compressibility, respiratory phasicity and response to augmentation. Saphenofemoral Junction: No evidence of thrombus. Normal compressibility and flow on color Doppler imaging. Profunda Femoral Vein: No evidence of thrombus. Normal compressibility and flow on color Doppler imaging. Femoral Vein: No evidence of thrombus. Normal compressibility, respiratory phasicity and  response to augmentation. Popliteal Vein: No evidence of thrombus. Normal compressibility, respiratory phasicity and response to augmentation. Calf Veins: No evidence of thrombus. Normal compressibility and flow on color Doppler imaging. Superficial Great Saphenous Vein: No evidence of thrombus. Normal compressibility. Venous Reflux:  None. Other Findings:  None. IMPRESSION: No evidence of deep venous thrombosis. Electronically Signed   By: Marijo Conception, M.D.   On: 09/22/2018 14:27    Procedures Procedures (including critical care time)  Medications Ordered in ED Medications  sodium chloride flush (NS) 0.9 % injection 3 mL (3 mLs Intravenous Given 09/23/18 2200)  sodium chloride flush (NS) 0.9 % injection 3 mL (has no administration in time range)  0.9 %  sodium chloride infusion (has no administration in time range)  acetaminophen (TYLENOL) tablet 650 mg (has no administration in time range)    Or  acetaminophen (TYLENOL) suppository 650 mg (has no administration in time range)  insulin aspart (novoLOG) injection 0-9 Units (2 Units Subcutaneous Given 09/23/18 1739)  insulin aspart (novoLOG) injection 0-5 Units (0 Units Subcutaneous Not Given 09/23/18 2200)  pravastatin (PRAVACHOL) tablet 40 mg (40 mg Oral Given 09/23/18 1739)  donepezil (ARICEPT) tablet 10 mg (10 mg Oral Given 09/23/18 2159)  sertraline (ZOLOFT) tablet 50 mg (50 mg Oral Given 09/23/18 0949)  insulin glargine (LANTUS) injection 10 Units (10 Units Subcutaneous Given 09/23/18 0946)  pantoprazole (PROTONIX) EC tablet 40 mg (40 mg Oral Given 09/23/18 0949)  tamsulosin (FLOMAX) capsule 0.4 mg (0.4 mg Oral Given 09/23/18 0949)  ferrous sulfate tablet 325 mg (325 mg Oral Given 09/23/18 0948)  Vitamin D (Ergocalciferol) (DRISDOL) capsule 50,000 Units (50,000 Units Oral Given 09/22/18 0943)  loratadine (CLARITIN) tablet 10 mg (10 mg Oral Given 09/23/18 0948)  polyvinyl alcohol (LIQUIFILM TEARS) 1.4 % ophthalmic solution 1 drop  (has no administration in time range)  ondansetron (ZOFRAN) injection 4 mg (  has no administration in time range)  darifenacin (ENABLEX) 24 hr tablet 7.5 mg (7.5 mg Oral Given 09/23/18 0949)  perflutren lipid microspheres (DEFINITY) IV suspension (1 mL Intravenous Given 09/22/18 1145)  polyethylene glycol (MIRALAX / GLYCOLAX) packet 17 g (17 g Oral Given 09/23/18 1244)  senna (SENOKOT) tablet 17.2 mg (17.2 mg Oral Given 09/23/18 1244)  0.9 %  sodium chloride infusion ( Intravenous New Bag/Given 09/23/18 2230)  aspirin EC tablet 81 mg (81 mg Oral Given 09/23/18 1739)  levalbuterol (XOPENEX) nebulizer solution 0.63 mg (0.63 mg Nebulization Given 09/23/18 2231)  ipratropium (ATROVENT) nebulizer solution 0.5 mg (0.5 mg Nebulization Given 09/23/18 2234)  furosemide (LASIX) injection 20 mg (20 mg Intravenous Given 09/22/18 0047)  heparin bolus via infusion 5,000 Units (5,000 Units Intravenous Bolus from Bag 09/22/18 0102)  technetium TC 67M diethylenetriame-pentaacetic acid (DTPA) injection 30 millicurie (33 millicuries Inhalation Given 09/22/18 0830)  technetium albumin aggregated (MAA) injection solution 4 millicurie (4.4 millicuries Intravenous Contrast Given 09/22/18 0850)  sodium chloride 0.9 % bolus 500 mL ( Intravenous Stopped 09/23/18 1535)     Initial Impression / Assessment and Plan / ED Course  I have reviewed the triage vital signs and the nursing notes.  Pertinent labs & imaging results that were available during my care of the patient were reviewed by me and considered in my medical decision making (see chart for details).     Patient with hypoxia into the 80s.  Placed on supplemental oxygen. X-ray without acute findings.  Patient does have elevation in his creatinine.  Unable to get CT angios chest to rule out PE in the emergency department.  Discussed with Dr. Maudie Mercury who will see patient and admit. Final Clinical Impressions(s) / ED Diagnoses   Final diagnoses:  Confusion    Hypoxia    ED Discharge Orders    None       Julianne Rice, MD 09/23/18 2329

## 2018-09-22 ENCOUNTER — Observation Stay (HOSPITAL_COMMUNITY): Payer: Medicare HMO

## 2018-09-22 ENCOUNTER — Other Ambulatory Visit: Payer: Self-pay

## 2018-09-22 ENCOUNTER — Telehealth: Payer: Self-pay

## 2018-09-22 ENCOUNTER — Encounter (HOSPITAL_COMMUNITY): Payer: Self-pay

## 2018-09-22 ENCOUNTER — Observation Stay (HOSPITAL_BASED_OUTPATIENT_CLINIC_OR_DEPARTMENT_OTHER): Payer: Medicare HMO

## 2018-09-22 DIAGNOSIS — F0280 Dementia in other diseases classified elsewhere without behavioral disturbance: Secondary | ICD-10-CM

## 2018-09-22 DIAGNOSIS — G309 Alzheimer's disease, unspecified: Secondary | ICD-10-CM

## 2018-09-22 DIAGNOSIS — R7989 Other specified abnormal findings of blood chemistry: Secondary | ICD-10-CM | POA: Diagnosis not present

## 2018-09-22 DIAGNOSIS — J8410 Pulmonary fibrosis, unspecified: Secondary | ICD-10-CM

## 2018-09-22 DIAGNOSIS — M79662 Pain in left lower leg: Secondary | ICD-10-CM | POA: Diagnosis not present

## 2018-09-22 DIAGNOSIS — R0902 Hypoxemia: Secondary | ICD-10-CM

## 2018-09-22 DIAGNOSIS — G308 Other Alzheimer's disease: Secondary | ICD-10-CM

## 2018-09-22 DIAGNOSIS — J9601 Acute respiratory failure with hypoxia: Secondary | ICD-10-CM

## 2018-09-22 DIAGNOSIS — E1165 Type 2 diabetes mellitus with hyperglycemia: Secondary | ICD-10-CM

## 2018-09-22 DIAGNOSIS — M79661 Pain in right lower leg: Secondary | ICD-10-CM | POA: Diagnosis not present

## 2018-09-22 DIAGNOSIS — N183 Chronic kidney disease, stage 3 (moderate): Secondary | ICD-10-CM

## 2018-09-22 DIAGNOSIS — G9341 Metabolic encephalopathy: Secondary | ICD-10-CM

## 2018-09-22 DIAGNOSIS — E871 Hypo-osmolality and hyponatremia: Secondary | ICD-10-CM

## 2018-09-22 DIAGNOSIS — I5030 Unspecified diastolic (congestive) heart failure: Secondary | ICD-10-CM

## 2018-09-22 DIAGNOSIS — F0390 Unspecified dementia without behavioral disturbance: Secondary | ICD-10-CM

## 2018-09-22 DIAGNOSIS — I10 Essential (primary) hypertension: Secondary | ICD-10-CM

## 2018-09-22 LAB — COMPREHENSIVE METABOLIC PANEL
ALBUMIN: 2.9 g/dL — AB (ref 3.5–5.0)
ALT: 14 U/L (ref 0–44)
ANION GAP: 7 (ref 5–15)
AST: 17 U/L (ref 15–41)
Alkaline Phosphatase: 64 U/L (ref 38–126)
BILIRUBIN TOTAL: 0.8 mg/dL (ref 0.3–1.2)
BUN: 19 mg/dL (ref 8–23)
CALCIUM: 8.4 mg/dL — AB (ref 8.9–10.3)
CHLORIDE: 103 mmol/L (ref 98–111)
CO2: 28 mmol/L (ref 22–32)
CREATININE: 1.77 mg/dL — AB (ref 0.61–1.24)
GFR calc Af Amer: 39 mL/min — ABNORMAL LOW (ref >60)
GFR calc non Af Amer: 33 mL/min — ABNORMAL LOW (ref >60)
Glucose, Bld: 116 mg/dL — ABNORMAL HIGH (ref 70–99)
Potassium: 4.4 mmol/L (ref 3.5–5.1)
Sodium: 138 mmol/L (ref 135–145)
Total Protein: 7.6 g/dL (ref 6.5–8.1)

## 2018-09-22 LAB — GLUCOSE, CAPILLARY
GLUCOSE-CAPILLARY: 162 mg/dL — AB (ref 70–99)
Glucose-Capillary: 100 mg/dL — ABNORMAL HIGH (ref 70–99)
Glucose-Capillary: 142 mg/dL — ABNORMAL HIGH (ref 70–99)
Glucose-Capillary: 164 mg/dL — ABNORMAL HIGH (ref 70–99)

## 2018-09-22 LAB — PROTIME-INR
INR: 1.16
INR: 1.09
PROTHROMBIN TIME: 14 s (ref 11.4–15.2)
Prothrombin Time: 14.7 seconds (ref 11.4–15.2)

## 2018-09-22 LAB — TROPONIN I: Troponin I: <0.03 ng/mL

## 2018-09-22 LAB — CBG MONITORING, ED: Glucose-Capillary: 230 mg/dL — ABNORMAL HIGH (ref 70–99)

## 2018-09-22 LAB — CBC
HCT: 34.3 % — ABNORMAL LOW (ref 39.0–52.0)
Hemoglobin: 10.6 g/dL — ABNORMAL LOW (ref 13.0–17.0)
MCH: 29.4 pg (ref 26.0–34.0)
MCHC: 30.9 g/dL (ref 30.0–36.0)
MCV: 95 fL (ref 80.0–100.0)
Platelets: 246 10*3/uL (ref 150–400)
RBC: 3.61 MIL/uL — ABNORMAL LOW (ref 4.22–5.81)
RDW: 13.5 % (ref 11.5–15.5)
WBC: 5.2 10*3/uL (ref 4.0–10.5)
nRBC: 0 % (ref 0.0–0.2)

## 2018-09-22 LAB — TSH: TSH: 0.441 u[IU]/mL (ref 0.350–4.500)

## 2018-09-22 LAB — URINE CULTURE

## 2018-09-22 LAB — FERRITIN: FERRITIN: 592 ng/mL — AB (ref 24–336)

## 2018-09-22 LAB — BLOOD GAS, ARTERIAL
Acid-Base Excess: 2.1 mmol/L — ABNORMAL HIGH (ref 0.0–2.0)
Bicarbonate: 25.7 mmol/L (ref 20.0–28.0)
DRAWN BY: 21310
O2 Content: 2 L/min
O2 Saturation: 94.1 %
PATIENT TEMPERATURE: 37
pCO2 arterial: 43.4 mmHg (ref 32.0–48.0)
pH, Arterial: 7.401 (ref 7.350–7.450)
pO2, Arterial: 78.2 mmHg — ABNORMAL LOW (ref 83.0–108.0)

## 2018-09-22 LAB — IRON AND TIBC
Iron: 47 ug/dL (ref 45–182)
Saturation Ratios: 18 % (ref 17.9–39.5)
TIBC: 263 ug/dL (ref 250–450)
UIBC: 216 ug/dL

## 2018-09-22 LAB — HEPARIN LEVEL (UNFRACTIONATED): Heparin Unfractionated: 0.12 [IU]/mL — ABNORMAL LOW (ref 0.30–0.70)

## 2018-09-22 LAB — ECHOCARDIOGRAM COMPLETE: Weight: 4024.72 [oz_av]

## 2018-09-22 LAB — AMMONIA: Ammonia: 10 umol/L (ref 9–35)

## 2018-09-22 LAB — APTT: aPTT: 144 seconds — ABNORMAL HIGH (ref 24–36)

## 2018-09-22 LAB — VITAMIN B12: VITAMIN B 12: 456 pg/mL (ref 180–914)

## 2018-09-22 LAB — D-DIMER, QUANTITATIVE (NOT AT ARMC): D DIMER QUANT: 13.48 ug{FEU}/mL — AB (ref 0.00–0.50)

## 2018-09-22 LAB — FOLATE: Folate: 11.7 ng/mL (ref >5.9)

## 2018-09-22 LAB — CK: CK TOTAL: 61 U/L (ref 49–397)

## 2018-09-22 MED ORDER — INSULIN PEN NEEDLE 29G X 5MM MISC: 1.0000 | 2 refills | Status: DC

## 2018-09-22 MED ORDER — TAMSULOSIN HCL 0.4 MG PO CAPS
0.4000 mg | ORAL_CAPSULE | Freq: Every morning | ORAL | Status: DC
  Administered 2018-09-22 – 2018-09-26 (×5): 0.4 mg via ORAL
  Filled 2018-09-22 (×5): qty 1

## 2018-09-22 MED ORDER — INSULIN GLARGINE 100 UNIT/ML ~~LOC~~ SOLN
10.0000 [IU] | Freq: Every day | SUBCUTANEOUS | Status: DC
  Administered 2018-09-22 – 2018-09-26 (×5): 10 [IU] via SUBCUTANEOUS
  Filled 2018-09-22 (×6): qty 0.1

## 2018-09-22 MED ORDER — DONEPEZIL HCL 5 MG PO TABS
10.0000 mg | ORAL_TABLET | Freq: Every day | ORAL | Status: DC
  Administered 2018-09-22 – 2018-09-25 (×5): 10 mg via ORAL
  Filled 2018-09-22 (×5): qty 2

## 2018-09-22 MED ORDER — CARBOXYMETH-GLYCERIN-POLYSORB 0.5-1-0.5 % OP SOLN: 1.0000 [drp] | Freq: Four times a day (QID) | OPHTHALMIC | Status: DC | PRN

## 2018-09-22 MED ORDER — TECHNETIUM TC 99M DIETHYLENETRIAME-PENTAACETIC ACID
30.0000 | Freq: Once | INTRAVENOUS | Status: AC | PRN
  Administered 2018-09-22: 33 via RESPIRATORY_TRACT

## 2018-09-22 MED ORDER — HEPARIN (PORCINE) IN NACL 100-0.45 UNIT/ML-% IJ SOLN
1550.0000 [IU]/h | INTRAMUSCULAR | Status: DC
  Administered 2018-09-22: 1550 [IU]/h via INTRAVENOUS
  Filled 2018-09-22: qty 250

## 2018-09-22 MED ORDER — PANTOPRAZOLE SODIUM 40 MG PO TBEC
40.0000 mg | DELAYED_RELEASE_TABLET | Freq: Every day | ORAL | Status: DC
  Administered 2018-09-22 – 2018-09-26 (×5): 40 mg via ORAL
  Filled 2018-09-22 (×5): qty 1

## 2018-09-22 MED ORDER — VITAMIN D (ERGOCALCIFEROL) 1.25 MG (50000 UNIT) PO CAPS
50000.0000 [IU] | ORAL_CAPSULE | ORAL | Status: DC
  Administered 2018-09-22: 50000 [IU] via ORAL
  Filled 2018-09-22 (×2): qty 1

## 2018-09-22 MED ORDER — ONDANSETRON HCL 4 MG/2ML IJ SOLN: 4.0000 mg | Freq: Four times a day (QID) | INTRAMUSCULAR | Status: DC | PRN

## 2018-09-22 MED ORDER — PERFLUTREN LIPID MICROSPHERE
1.0000 mL | INTRAVENOUS | Status: AC | PRN
  Administered 2018-09-22: 1 mL via INTRAVENOUS
  Filled 2018-09-22: qty 10

## 2018-09-22 MED ORDER — PRAVASTATIN SODIUM 40 MG PO TABS
40.0000 mg | ORAL_TABLET | Freq: Every day | ORAL | Status: DC
  Administered 2018-09-22 – 2018-09-25 (×4): 40 mg via ORAL
  Filled 2018-09-22 (×4): qty 1

## 2018-09-22 MED ORDER — LORATADINE 10 MG PO TABS
10.0000 mg | ORAL_TABLET | Freq: Every morning | ORAL | Status: DC
  Administered 2018-09-22 – 2018-09-26 (×5): 10 mg via ORAL
  Filled 2018-09-22 (×5): qty 1

## 2018-09-22 MED ORDER — POLYVINYL ALCOHOL 1.4 % OP SOLN: 1.0000 [drp] | Freq: Four times a day (QID) | OPHTHALMIC | Status: DC | PRN

## 2018-09-22 MED ORDER — FERROUS SULFATE 325 (65 FE) MG PO TABS
325.0000 mg | ORAL_TABLET | Freq: Every day | ORAL | Status: DC
  Administered 2018-09-22 – 2018-09-26 (×5): 325 mg via ORAL
  Filled 2018-09-22 (×5): qty 1

## 2018-09-22 MED ORDER — TROSPIUM CHLORIDE ER 60 MG PO CP24: 60.0000 mg | ORAL_CAPSULE | Freq: Every day | ORAL | Status: DC

## 2018-09-22 MED ORDER — TRUE METRIX METER W/DEVICE KIT: PACK | 0 refills | Status: DC

## 2018-09-22 MED ORDER — HEPARIN BOLUS VIA INFUSION
5000.0000 [IU] | Freq: Once | INTRAVENOUS | Status: AC
  Administered 2018-09-22: 5000 [IU] via INTRAVENOUS

## 2018-09-22 MED ORDER — TECHNETIUM TO 99M ALBUMIN AGGREGATED
4.0000 | Freq: Once | INTRAVENOUS | Status: AC | PRN
  Administered 2018-09-22: 4.4 via INTRAVENOUS

## 2018-09-22 MED ORDER — SERTRALINE HCL 50 MG PO TABS
50.0000 mg | ORAL_TABLET | Freq: Every day | ORAL | Status: DC
  Administered 2018-09-22 – 2018-09-26 (×5): 50 mg via ORAL
  Filled 2018-09-22 (×5): qty 1

## 2018-09-22 MED ORDER — DARIFENACIN HYDROBROMIDE ER 7.5 MG PO TB24
7.5000 mg | ORAL_TABLET | Freq: Every day | ORAL | Status: DC
  Administered 2018-09-22 – 2018-09-26 (×5): 7.5 mg via ORAL
  Filled 2018-09-22 (×5): qty 1

## 2018-09-22 MED ORDER — FUROSEMIDE 10 MG/ML IJ SOLN
20.0000 mg | Freq: Once | INTRAMUSCULAR | Status: AC
  Administered 2018-09-22: 20 mg via INTRAVENOUS
  Filled 2018-09-22: qty 2

## 2018-09-22 NOTE — Telephone Encounter (Signed)
Diabetic supplies refilled and sent to [facility]

## 2018-09-22 NOTE — Care Management (Signed)
Pt active with [facility] for PT and OT service. Vaughan Basta, [facility] rep, aware pt in hospital.

## 2018-09-22 NOTE — Care Management Obs Status (Signed)
[place] NOTIFICATION   Patient Details  Name: LATHON ADAN MRN: [id number] Date of Birth: 09/18/1931   Medicare Observation Status Notification Given:       Shelda Altes 09/22/2018, 2:35 PM

## 2018-09-22 NOTE — Progress Notes (Signed)
Patient being transported to nuclear medication for VQ scan.

## 2018-09-22 NOTE — Care Management Obs Status (Signed)
[facility] NOTIFICATION   Patient Details  Name: Samuel Ryan MRN: [id number] Date of Birth: Jun 03, 1931   Medicare Observation Status Notification Given:  Yes    Sherald Barge, RN 09/22/2018, 3:21 PM

## 2018-09-22 NOTE — Telephone Encounter (Signed)
Diabetic supplies refilled and sent to [facility]

## 2018-09-22 NOTE — Progress Notes (Signed)
ANTICOAGULATION CONSULT NOTE - Preliminary  Pharmacy Consult for heparin Indication: pulmonary embolus  Allergies  Allergen Reactions  . Zosyn [Piperacillin Sod-Tazobactam So] Anaphylaxis, Swelling and Other (See Comments)    Facial swelling Has patient had a PCN reaction causing immediate rash, facial/tongue/throat swelling, SOB or lightheadedness with hypotension: Yes Has patient had a PCN reaction causing severe rash involving mucus membranes or skin necrosis: No Has patient had a PCN reaction that required hospitalization: No Has patient had a PCN reaction occurring within the last 10 years: Yes If all of the above answers are "NO", then may proceed with Cephalosporin use.   . Ace Inhibitors Cough  . Vancomycin Swelling and Other (See Comments)    Facial swelling    Patient Measurements: Weight: 260 lb (117.9 kg)     Vital Signs: Temp: 97.8 F (36.6 C) (10/24 1805) Temp Source: Oral (10/24 1805) BP: 142/107 (10/25 0030) Pulse Rate: 103 (10/25 0030)  Labs: Recent Labs    09/21/18 1914  HGB 8.7*  HCT 27.8*  PLT 264  CREATININE 1.85*  TROPONINI <0.03   Estimated Creatinine Clearance: 37.6 mL/min (A) (by C-G formula based on SCr of 1.85 mg/dL (H)).  Medical History: Past Medical History:  Diagnosis Date  . Acute cholecystitis 11/2012   Drained percutaneously  . Anxiety   . BPH (benign prostatic hypertrophy)   . Deep venous thrombosis (HCC)    Left leg, diagnosed 7/12  . Dementia [facility])    Needs supervision for safety per son - POA  . Diverticulitis   . Essential hypertension, benign   . GERD (gastroesophageal reflux disease)   . History of pneumonia    Prior to 2012  . Incontinence of urine   . Mixed hyperlipidemia   . Osteoarthritis   . Pulmonary embolism ([place])    Diagnosed 7/12  . Type 2 diabetes mellitus (HCC)     Medications:  Scheduled:  . donepezil  10 mg Oral QHS  . ferrous sulfate  325 mg Oral Q breakfast  . insulin aspart  0-5 Units  Subcutaneous QHS  . insulin aspart  0-9 Units Subcutaneous TID WC  . insulin glargine  10 Units Subcutaneous Daily  . loratadine  10 mg Oral q morning - 10a  . pantoprazole  40 mg Oral Daily  . pravastatin  40 mg Oral q1800  . sertraline  50 mg Oral Daily  . sodium chloride flush  3 mL Intravenous Q12H  . tamsulosin  0.4 mg Oral q morning - 10a  . Trospium Chloride  60 mg Oral Daily  . Vitamin D (Ergocalciferol)  50,000 Units Oral Q7 days   Infusions:  . sodium chloride    . heparin 1,550 Units/hr (09/22/18 0059)    Assessment: 84 yo male with HTN, HLD, T2DM, h/o PE/DVT came to Ed with generalized weakness and confusion.  Found to be hypoxic.  D-Dimer elevated, waiting on VQ scan.  Starting heparin in meantime with presumed PE.   Goal of Therapy:  Heparin level 0.3-0.7 units/ml   Plan:  Give 5000 units bolus x 1 Start heparin infusion at 1550 units/hr Check anti-Xa level in 8 hours and daily while on heparin Continue to monitor H&H and platelets Preliminary review of pertinent patient information completed.  Forestine Na clinical pharmacist will complete review during morning rounds to assess the patient and finalize treatment regimen.  Marcedes Tech, Magdalene Molly, RPH 09/22/2018,12:38 AM

## 2018-09-22 NOTE — H&P (Addendum)
[facility] H&P   Patient Demographics:    Samuel Ryan, is a 84 y.o. male  MRN: [id number]   DOB - 25-Dec-1930  Admit Date - 09/21/2018  Outpatient Primary MD for the patient is [name], MD  Referring MD/NP/PA: Julianne Rice  Outpatient Specialists:   Patient coming from: home  Chief Complaint  Patient presents with  . Altered Mental Status      HPI:    Samuel Ryan  is a 84 y.o. male, w hypertension, hyperlipidemia, dm2, ckd stage 3, dementia h/o PE/ DVT, apparently was seen by therapy today and noted to be tachycardic ?[name]  Son thinks he has been generally weak, confused. [name] He was told to go to ER for evaluation by therapy/ and by PCP.    In Ed, pt noted to be hypoxic 77% on RA?  T 97.8 P 110  Bp 126/74  Pox 77% on RA?  CT brain IMPRESSION: 1. Stable cerebral atrophy, ventriculomegaly and periventricular white matter disease. 2. No new/acute intracranial findings.  CXR IMPRESSION: Chronic bibasilar atelectasis or scar.  No acute change.  Wbc 5.5, Hgb 8.7, Plt 264  Na 133, K 4.8, Bun 21, Creratinine 1.85 Ast 16, Alt 13 Trop <0.03  BNP 120  D dimer 13.48  Pt will be admitted for hypoxia, and tachycardia, r/o PE.  ddx sleep apnea      Review of systems:    In addition to the HPI above,  No Fever-chills, No Headache, No changes with Vision or hearing, No problems swallowing food or Liquids, No Chest pain, Cough or Shortness of Breath, No Abdominal pain, No Nausea or Vommitting, Bowel movements are regular, No Blood in stool or Urine, No dysuria, No new skin rashes or bruises, No new joints pains-aches,   No recent weight gain or loss, No polyuria, polydypsia or polyphagia, No significant Mental Stressors.  A full 10 point Review of Systems was done, except as stated above, all other Review of Systems were negative.   With Past History of  the following :    Past Medical History:  Diagnosis Date  . Acute cholecystitis 11/2012   Drained percutaneously  . Anxiety   . BPH (benign prostatic hypertrophy)   . Deep venous thrombosis (HCC)    Left leg, diagnosed 7/12  . Dementia [facility])    Needs supervision for safety per son - POA  . Diverticulitis   . Essential hypertension, benign   . GERD (gastroesophageal reflux disease)   . History of pneumonia    Prior to 2012  . Incontinence of urine   . Mixed hyperlipidemia   . Osteoarthritis   . Pulmonary embolism ([place])    Diagnosed 7/12  . Type 2 diabetes mellitus ([place])       Past Surgical History:  Procedure Laterality Date  . CARPAL TUNNEL RELEASE    . COLONOSCOPY  08/31/2012  Procedure: COLONOSCOPY;  Surgeon: Rogene Houston, MD;  Location: [facility];  Service: Endoscopy;  Laterality: N/A;  830  . CYSTOSCOPY WITH INJECTION N/A 05/15/2018   Procedure: CYSTOSCOPY WITH BOTOX INJECTION;  Surgeon: Cleon Gustin, MD;  Location: [facility] ORS;  Service: Urology;  Laterality: N/A;  . CYSTOSCOPY WITH INSERTION OF UROLIFT N/A 09/02/2016   Procedure: CYSTOSCOPY WITH INSERTION OF UROLIFT;  Surgeon: Cleon Gustin, MD;  Location: [facility];  Service: Urology;  Laterality: N/A;  . EYE SURGERY Bilateral    bilateral cataract  . Right total knee replacement  04/23/2011  . SPINE SURGERY  prior to 2012   Neck fusion  . UMBILICAL HERNIA REPAIR     umbilical      Social History:     Social History   Tobacco Use  . Smoking status: Never Smoker  . Smokeless tobacco: Never Used  Substance Use Topics  . Alcohol use: No     Lives - at home  Mobility - walks w assistance   Family History :     Family History  Problem Relation Age of Onset  . Cancer Mother        Stomach  . Diabetes Sister   . Hypertension Sister   . Diabetes Brother   . Cancer Brother        Gallbladder  . Seizures Son        due to meningitis as a child       Home  Medications:   Prior to Admission medications   Medication Sig Start Date End Date Taking? Authorizing Provider  acetaminophen (TYLENOL) 500 MG tablet Take 1,000 mg by mouth every 6 (six) hours as needed for mild pain, moderate pain or headache.    Yes [provider]  Carboxymeth-Glycerin-Polysorb 0.5-1-0.5 % SOLN Place 1 drop into both eyes 4 (four) times daily as needed (for dry eye relief).    Yes [provider]  donepezil (ARICEPT) 10 MG tablet TAKE 1 TABLET AT BEDTIME Patient taking differently: Take 10 mg by mouth at bedtime.  03/27/18  Yes [name], MD  FEROSUL 325 (65 Fe) MG tablet TAKE 1 TABLET TWICE DAILY Patient taking differently: Take 325 mg by mouth daily with breakfast.  03/27/18  Yes [name], MD  glipiZIDE (GLUCOTROL XL) 10 MG 24 hr tablet Take 1 tablet (10 mg total) by mouth daily with breakfast. 06/06/18  Yes [name], MD  Insulin Glargine (LANTUS SOLOSTAR) 100 UNIT/ML Solostar Pen Inject 10-20 Units into the skin every morning. HOLD for blood sugar levels below 100 06/06/18  Yes [name], MD  loratadine (CLARITIN) 10 MG tablet Take 10 mg by mouth every morning.    Yes [provider]  lovastatin (MEVACOR) 40 MG tablet TAKE 1 TABLET AT BEDTIME Patient taking differently: Take 40 mg by mouth at bedtime.  03/27/18  Yes [name], MD  omeprazole (PRILOSEC) 40 MG capsule TAKE 1 CAPSULE EVERY DAY Patient taking differently: Take 40 mg by mouth every morning.  03/27/18  Yes [name], MD  sertraline (ZOLOFT) 50 MG tablet TAKE 1 TABLET EVERY DAY Patient taking differently: Take 50 mg by mouth daily.  09/19/18  Yes [name], MD  tamsulosin (FLOMAX) 0.4 MG CAPS capsule TAKE 1 CAPSULE EVERY DAY Patient taking differently: Take 0.4 mg by mouth every morning.  03/27/18  Yes [name], MD  traMADol (ULTRAM) 50 MG tablet Take 50 mg by  mouth 2 (two) times daily.   Yes [provider]  Trospium Chloride 60 MG CP24 Take 60 mg by mouth daily.  06/02/17  Yes [provider]  Vitamin D, Ergocalciferol, (DRISDOL) 50000 units CAPS capsule Take 1 capsule (50,000 Units total) by mouth every 7 (seven) days. 09/07/18  Yes [name], MD  glucose blood (TRUE METRIX BLOOD GLUCOSE TEST) test strip TEST ONCE DAILY dx e11.9 06/07/18   [name], MD  Insulin Pen Needle (PEN NEEDLES) 31G X 5 MM MISC 1 each by Does not apply route as directed. Use to inject lantus daily dx E11.65 02/14/18   [name], MD     Allergies:     Allergies  Allergen Reactions  . Zosyn [Piperacillin Sod-Tazobactam So] Anaphylaxis, Swelling and Other (See Comments)    Facial swelling Has patient had a PCN reaction causing immediate rash, facial/tongue/throat swelling, SOB or lightheadedness with hypotension: Yes Has patient had a PCN reaction causing severe rash involving mucus membranes or skin necrosis: No Has patient had a PCN reaction that required hospitalization: No Has patient had a PCN reaction occurring within the last 10 years: Yes If all of the above answers are "NO", then may proceed with Cephalosporin use.   . Ace Inhibitors Cough  . Vancomycin Swelling and Other (See Comments)    Facial swelling     Physical Exam:   Vitals  Blood pressure 126/74, pulse (!) 110, temperature 97.8 F (36.6 C), temperature source Oral, resp. rate 16, weight 117.9 kg, SpO2 (!) 77 %.   1. General lying in bed in NAD,  confused  2.  Not Suicidal or Homicidal, Awake Alert, Oriented X 1  3. No F.N deficits, ALL C.Nerves Intact, Strength 5/5 all 4 extremities, Sensation intact all 4 extremities, Plantars down going.  4. Ears and Eyes appear Normal, Conjunctivae clear, PERRLA. Moist Oral Mucosa.  5. Supple Neck, No JVD, No cervical lymphadenopathy appriciated, No Carotid Bruits.  6. Symmetrical Chest wall movement, slight crackles bilateral base, no  wheezing  7.tachy s1, s2,  8. Positive Bowel Sounds, Abdomen Soft, No tenderness, No organomegaly appriciated,No rebound -guarding or rigidity.  9.  No Cyanosis, Normal Skin Turgor, No Skin Rash or Bruise.  10. Good muscle tone,  joints appear normal , no effusions, Normal ROM.  11. No Palpable Lymph Nodes in Neck or Axillae     Data Review:    CBC Recent Labs  Lab 09/21/18 1914  WBC 5.5  HGB 8.7*  HCT 27.8*  PLT 264  MCV 97.5  MCH 30.5  MCHC 31.3  RDW 13.6  LYMPHSABS 1.4  MONOABS 0.7  EOSABS 0.2  BASOSABS 0.0   ------------------------------------------------------------------------------------------------------------------  Chemistries  Recent Labs  Lab 09/21/18 1914  NA 133*  K 4.8  CL 103  CO2 24  GLUCOSE 300*  BUN 21  CREATININE 1.85*  CALCIUM 8.1*  AST 16  ALT 13  ALKPHOS 65  BILITOT 0.6   ------------------------------------------------------------------------------------------------------------------ estimated creatinine clearance is 37.6 mL/min (A) (by C-G formula based on SCr of 1.85 mg/dL (H)). ------------------------------------------------------------------------------------------------------------------ No results for input(s): TSH, T4TOTAL, T3FREE, THYROIDAB in the last 72 hours.  Invalid input(s): FREET3  Coagulation profile No results for input(s): INR, PROTIME in the last 168 hours. ------------------------------------------------------------------------------------------------------------------- Recent Labs    09/21/18 1914  DDIMER 13.48*   -------------------------------------------------------------------------------------------------------------------  Cardiac Enzymes Recent Labs  Lab 09/21/18 1914  TROPONINI <0.03   ------------------------------------------------------------------------------------------------------------------    Component Value Date/Time   BNP 120.0 (H)  09/21/2018 1914      ---------------------------------------------------------------------------------------------------------------  Urinalysis    Component Value Date/Time   COLORURINE YELLOW 09/20/2018 1800   APPEARANCEUR CLEAR 09/20/2018 1800   LABSPEC 1.012 09/20/2018 1800   PHURINE 6.0 09/20/2018 1800   GLUCOSEU NEGATIVE 09/20/2018 1800   HGBUR NEGATIVE 09/20/2018 1800   BILIRUBINUR NEGATIVE 09/20/2018 1800   BILIRUBINUR neg 12/12/2017 1516   KETONESUR NEGATIVE 09/20/2018 1800   PROTEINUR NEGATIVE 09/20/2018 1800   UROBILINOGEN 1.0 12/12/2017 1516   UROBILINOGEN 0.2 06/02/2015 1310   NITRITE NEGATIVE 09/20/2018 1800   LEUKOCYTESUR NEGATIVE 09/20/2018 1800    ----------------------------------------------------------------------------------------------------------------   Imaging Results:    Dg Chest 2 View  Result Date: 09/21/2018 CLINICAL DATA:  Weakness and confusion. EXAM: CHEST - 2 VIEW COMPARISON:  August 11, 2018 FINDINGS: Stable bibasilar changes, likely scar atelectasis. Stable cardiomegaly. No change in the mediastinum. No pneumothorax. IMPRESSION: Chronic bibasilar atelectasis or scar.  No acute change. Electronically Signed   By: Dorise Bullion III M.D   On: 09/21/2018 20:08   Ct Head Wo Contrast  Result Date: 09/21/2018 CLINICAL DATA:  Five-day history of confusion. EXAM: CT HEAD WITHOUT CONTRAST TECHNIQUE: Contiguous axial images were obtained from the base of the skull through the vertex without intravenous contrast. COMPARISON:  Head CT 08/03/2018 FINDINGS: Brain: Stable age related cerebral atrophy, ventriculomegaly and periventricular white matter disease. No extra-axial fluid collections are identified. No CT findings for acute hemispheric infarction or intracranial hemorrhage. No mass lesions. Stable bilateral basal ganglia calcifications. The brainstem and cerebellum are normal. Vascular: No hyperdense vessel or unexpected calcification. Skull: No skull fracture or  bone lesion. Stable hyperostosis frontalis interna. Sinuses/Orbits: The paranasal sinuses and mastoid air cells are grossly clear. The globes are intact. Other: No scalp lesions or hematoma. IMPRESSION: 1. Stable cerebral atrophy, ventriculomegaly and periventricular white matter disease. 2. No new/acute intracranial findings. Electronically Signed   By: Marijo Sanes M.D.   On: 09/21/2018 20:38    ekg nsr at 50, nl axis, RBBB     Assessment & Plan:    Principal Problem:   Hypoxia Active Problems:   Diabetes mellitus, insulin dependent (IDDM), controlled (HCC)   Alzheimer's dementia without behavioral disturbance (HCC)   Essential hypertension   CKD (chronic kidney disease) stage 3, GFR 30-59 ml/min (HCC)   AMS Check MRI brain  Hypoxia Confirm with ABG on RA ddx PE, CHF (mild), or undiagnosed sleep apnea Check VQ scan  Check cardiac echo Heparin iv while awaiting VQ  Anemia Check cbc in am  Hyponatremia Check cmp in am  CKD stage 3 Check cmp in am  Dm2 Cont lantus fsbs ac and qhs, ISS  Dementia Cont Aricept 10mg  po qday Cont Zoloft 50mg  po qday  Gerd Cont PPI  Hyperlipidemia Cont Mevacor 40mg  po qhs  Bph  Cont Flomax 0.4mg  po qhs    DVT Prophylaxis Heparin -  - SCDs   AM Labs Ordered, also please review Full Orders  Family Communication: Admission, patients condition and plan of care including tests being ordered have been discussed with the patient who indicate understanding and agree with the plan and Code Status.  Code Status  FULL CODE  Likely DC to  home  Condition GUARDED    Consults called: none  Admission status: observation  Time spent in minutes : 70   Jani Gravel M.D on 09/22/2018 at 12:08 AM  Between 7am to 7pm - Pager - [phone]  . After 7pm go to [email or web address] - password [email or web address]  [facility] - Office  [phone]

## 2018-09-22 NOTE — Progress Notes (Addendum)
PROGRESS NOTE  Samuel Ryan BTD:[id number] DOB: 04-21-1931 DOA: 09/21/2018 PCP: [name], MD  Brief History:  84 year old male with a history of dementia, hypertension, hyperlipidemia, CKD stage III, type is mellitus type II, pulmonary fibrosis, and DVT July 2012 presented with 1 week history of increasing somnolence, forgetfulness, and decreased oral intake.  Patient was recently admitted to the hospital from 08/03/2018 through 08/08/2018 when the patient was found on the floor encephalopathic.  It was thought to be due to dehydration and acute on chronic renal failure.  The patient was hydrated and subsequently discharged to skilled nursing facility.  He has since returned home.  The patient son also has noted that he has been dragging his left leg with increasing gait instability in the past week.  The patient has not had any falls since his last admission it.  At baseline, the patient is alert and oriented x2 and able to dress himself and feed himself.  He does require assistance with bathing.  The patient was taken to see his primary care provider on 09/20/2018.  Urinalysis at that time was negative.  In the emergency department, the patient was noted to be tachycardic and hypoxic with oxygen saturation 89%.  BMP showed sodium 133, serum creatinine 125, WBC 5.5.  Assessment/Plan: Acute metabolic encephalopathy -Likely multifactorial including hypoxia, dehydration, symptomatic anemia, and medication induced from his anticholinergic medications -Repeat serum B12 -Check ammonia -Personally reviewed EKG--sinus rhythm, right bundle branch block unchanged -Personally reviewed chest x-ray--chronic bibasilar atelectasis and increased interstitial markings unchanged -TSH -Folic acid -10/62/6948 urinalysis negative for pyuria -MRI brain  Acute respiratory failure with hypoxia -VQ scan -May be due to progression of his pulmonary fibrosis -10/24 ABG--7.40/43/78/24 on  2L  Hyponatremia -Secondary to volume depletion -Continue IV fluids  Coagulopathy -PTT 144 at time of admission -likely spurious as pt was started on IV heparin @0059   CKD stage III -Baseline creatinine 1.5-1.8 -A.m. BMP  Diabetes mellitus type 2, uncontrolled with hyperglycemia -08/13/2018 hemoglobin A1c 8.0 -Continue Lantus 10 units daily -NovoLog sliding scale  Hyperlipidemia -Continue statin   Dementia without behavioral disturbance -Patient is at risk for hospital delirium -Continue Aricept  Elevated D-dimer -venous duplex lower extremities    Disposition Plan:   Home vs SNF 1-2 days  Family Communication:   Son updated on phone--Total time spent 35 minutes.  Greater than 50% spent face to face counseling and coordinating care. 0840 to 0915   Consultants:  none  Code Status:  FULL   DVT Prophylaxis:  IV Heparin   Procedures: As Listed in Progress Note Above  Antibiotics: None    Subjective: Patient denies fevers, chills, headache, chest pain, dyspnea, nausea, vomiting, diarrhea, abdominal pain, dysuria, hematuria, hematochezia, and melena.   Objective: Vitals:   09/22/18 0030 09/22/18 0144 09/22/18 0531 09/22/18 0805  BP: (!) 142/107 (!) 147/94 (!) 142/97   Pulse: (!) 103 (!) 105 (!) 105   Resp: 20     Temp:  98.1 F (36.7 C) 98.2 F (36.8 C)   TempSrc:  Oral Oral   SpO2: 97% 97% 95% 93%  Weight:   114.1 kg     Intake/Output Summary (Last 24 hours) at 09/22/2018 0854 Last data filed at 09/22/2018 0535 Gross per 24 hour  Intake 113.7 ml  Output 2000 ml  Net -1886.3 ml   Weight change:  Exam:   General:  Pt is alert, follows commands appropriately, not in acute distress  HEENT: No icterus, No thrush, No neck mass, [place]/AT  Cardiovascular: RRR, S1/S2, no rubs, no gallops  Respiratory: bibasilar crackles, no wheeze  Abdomen: Soft/+BS, non tender, non distended, no guarding  Extremities: No edema, No lymphangitis, No petechiae, No  rashes, no synovitis   Data Reviewed: I have personally reviewed following labs and imaging studies Basic Metabolic Panel: Recent Labs  Lab 09/21/18 1914 09/22/18 0643  NA 133* 138  K 4.8 4.4  CL 103 103  CO2 24 28  GLUCOSE 300* 116*  BUN 21 19  CREATININE 1.85* 1.77*  CALCIUM 8.1* 8.4*   Liver Function Tests: Recent Labs  Lab 09/21/18 1914 09/22/18 0643  AST 16 17  ALT 13 14  ALKPHOS 65 64  BILITOT 0.6 0.8  PROT 7.2 7.6  ALBUMIN 2.8* 2.9*   No results for input(s): LIPASE, AMYLASE in the last 168 hours. No results for input(s): AMMONIA in the last 168 hours. Coagulation Profile: Recent Labs  Lab 09/22/18 0108  INR 1.16   CBC: Recent Labs  Lab 09/21/18 1914 09/22/18 0643  WBC 5.5 5.2  NEUTROABS 3.1  --   HGB 8.7* 10.6*  HCT 27.8* 34.3*  MCV 97.5 95.0  PLT 264 246   Cardiac Enzymes: Recent Labs  Lab 09/21/18 1914 09/22/18 0021 09/22/18 0643  TROPONINI <0.03 <0.03 <0.03   BNP: Invalid input(s): POCBNP CBG: Recent Labs  Lab 09/22/18 0042 09/22/18 0747  GLUCAP 230* 100*   HbA1C: No results for input(s): HGBA1C in the last 72 hours. Urine analysis:    Component Value Date/Time   COLORURINE YELLOW 09/20/2018 1800   APPEARANCEUR CLEAR 09/20/2018 1800   LABSPEC 1.012 09/20/2018 1800   PHURINE 6.0 09/20/2018 1800   GLUCOSEU NEGATIVE 09/20/2018 1800   HGBUR NEGATIVE 09/20/2018 1800   BILIRUBINUR NEGATIVE 09/20/2018 1800   BILIRUBINUR neg 12/12/2017 1516   KETONESUR NEGATIVE 09/20/2018 1800   PROTEINUR NEGATIVE 09/20/2018 1800   UROBILINOGEN 1.0 12/12/2017 1516   UROBILINOGEN 0.2 06/02/2015 1310   NITRITE NEGATIVE 09/20/2018 1800   LEUKOCYTESUR NEGATIVE 09/20/2018 1800   Sepsis Labs: @LABRCNTIP (procalcitonin:4,lacticidven:4) ) Recent Results (from the past 240 hour(s))  Culture, Urine     Status: Abnormal   Collection Time: 09/20/18  7:00 PM  Result Value Ref Range Status   Specimen Description   Final    URINE, RANDOM Performed at  [facility], [address], [place], [place] [address]    Special Requests   Final    NONE Performed at [facility], [address] [place], [place] [address]    Culture (A)  Final    <10,000 COLONIES/mL INSIGNIFICANT GROWTH Performed at [facility], [place] [address] [place], [place] [address]    Report Status 09/22/2018 FINAL  Final     Scheduled Meds: . donepezil  10 mg Oral QHS  . ferrous sulfate  325 mg Oral Q breakfast  . insulin aspart  0-5 Units Subcutaneous QHS  . insulin aspart  0-9 Units Subcutaneous TID WC  . insulin glargine  10 Units Subcutaneous Daily  . loratadine  10 mg Oral q morning - 10a  . pantoprazole  40 mg Oral Daily  . pravastatin  40 mg Oral q1800  . sertraline  50 mg Oral Daily  . sodium chloride flush  3 mL Intravenous Q12H  . tamsulosin  0.4 mg Oral q morning - 10a  . Trospium Chloride  60 mg Oral Daily  . Vitamin D (Ergocalciferol)  50,000 Units Oral Q7 days  Continuous Infusions: . sodium chloride    . heparin 1,550 Units/hr (09/22/18 0500)    Procedures/Studies: Dg Chest 2 View  Result Date: 09/21/2018 CLINICAL DATA:  Weakness and confusion. EXAM: CHEST - 2 VIEW COMPARISON:  August 11, 2018 FINDINGS: Stable bibasilar changes, likely scar atelectasis. Stable cardiomegaly. No change in the mediastinum. No pneumothorax. IMPRESSION: Chronic bibasilar atelectasis or scar.  No acute change. Electronically Signed   By: Dorise Bullion III M.D   On: 09/21/2018 20:08   Ct Head Wo Contrast  Result Date: 09/21/2018 CLINICAL DATA:  Five-day history of confusion. EXAM: CT HEAD WITHOUT CONTRAST TECHNIQUE: Contiguous axial images were obtained from the base of the skull through the vertex without intravenous contrast. COMPARISON:  Head CT 08/03/2018 FINDINGS: Brain: Stable age related cerebral atrophy, ventriculomegaly and periventricular white matter disease. No extra-axial fluid collections are identified. No CT findings for acute hemispheric  infarction or intracranial hemorrhage. No mass lesions. Stable bilateral basal ganglia calcifications. The brainstem and cerebellum are normal. Vascular: No hyperdense vessel or unexpected calcification. Skull: No skull fracture or bone lesion. Stable hyperostosis frontalis interna. Sinuses/Orbits: The paranasal sinuses and mastoid air cells are grossly clear. The globes are intact. Other: No scalp lesions or hematoma. IMPRESSION: 1. Stable cerebral atrophy, ventriculomegaly and periventricular white matter disease. 2. No new/acute intracranial findings. Electronically Signed   By: Marijo Sanes M.D.   On: 09/21/2018 20:38    Orson Eva, DO  [facility] Pager [phone]  If 7PM-7AM, please contact night-coverage [email or web address] Password [email or web address] 09/22/2018, 8:54 AM   LOS: 0 days

## 2018-09-22 NOTE — NC FL2 (Signed)
[place] LEVEL OF CARE SCREENING TOOL     IDENTIFICATION  Patient Name: Samuel Ryan Birthdate: 07-Aug-1931 Sex: male Admission Date (Current Location): 09/21/2018  [facility] and [place] Number:  [employer] and Address:  [place] [address], [place]      Provider Number: [id number]  Attending Physician Name and Address:  Orson Eva, MD  Relative Name and Phone Number:       Current Level of Care: Other (Comment)(observation) Recommended Level of Care: [place] Prior Approval Number:    Date Approved/Denied:   PASRR Number: [id number]  Discharge Plan: SNF    Current Diagnoses: Patient Active Problem List   Diagnosis Date Noted  . Acute metabolic encephalopathy 96/29/5284  . Acute respiratory failure with hypoxia ([place]) 09/22/2018  . Uncontrolled type 2 diabetes mellitus with hyperglycemia ([place]) 09/22/2018  . Dementia without behavioral disturbance ([place]) 09/22/2018  . Fall 08/10/2018  . Rhabdomyolysis 08/03/2018  . Morbid obesity ([place]) 06/12/2018  . Anemia 09/14/2017  . CKD (chronic kidney disease) stage 3, GFR 30-59 ml/min (HCC) 09/14/2017  . Hyponatremia 09/14/2017  . Atrial fibrillation ([place]) 09/14/2017  . Hospital discharge follow-up 11/26/2016  . Hypoalbuminemia 11/23/2016  . Hypoxia 10/29/2016  . Urinary frequency 09/17/2015  . At high risk for falls 03/30/2015  . Generalized osteoarthritis 07/16/2014  . Urinary incontinence 07/16/2014  . Pulmonary interstitial fibrosis ([place]) 07/03/2014  . Abnormal CXR (chest x-ray) 05/27/2014  . Thyroid mass of unclear etiology 05/07/2014  . Vitamin D deficiency 01/31/2014  . Allergic rhinitis 01/31/2014  . Diverticulosis of sigmoid colon 12/13/2012  . Right ventricular dysfunction 07/20/2011  . Heart disease, unspecified 07/20/2011  . Alzheimer's dementia without behavioral disturbance ([place]) 09/12/2010  . Goiter 06/19/2009  . Essential  hypertension 12/11/2008  . Diabetes mellitus, insulin dependent (IDDM), controlled ([place]) 09/05/2008  . Hyperlipidemia with target LDL less than 100 07/19/2008  . BPH (benign prostatic hyperplasia) 03/28/2008    Orientation RESPIRATION BLADDER Height & Weight     Self  O2(2L (currently) ) Incontinent Weight: 251 lb 8.7 oz (114.1 kg) Height:     BEHAVIORAL SYMPTOMS/MOOD NEUROLOGICAL BOWEL NUTRITION STATUS      Continent    AMBULATORY STATUS COMMUNICATION OF NEEDS Skin   Limited Assist Verbally Normal                       Personal Care Assistance Level of Assistance  Bathing, Feeding, Dressing Bathing Assistance: Limited assistance Feeding assistance: Independent Dressing Assistance: Limited assistance     Functional Limitations Info  Sight, Hearing, Speech Sight Info: Adequate Hearing Info: Adequate Speech Info: Adequate    SPECIAL CARE FACTORS FREQUENCY  PT (By licensed PT)     PT Frequency: 5x/week              Contractures Contractures Info: Not present    Additional Factors Info  Code Status, Allergies, Psychotropic Code Status Info: Full Code Allergies Info: Zosyn, Ace Inhibitors, Vancomycin Psychotropic Info: Zoloft         Current Medications (09/22/2018):  This is the current hospital active medication list Current Facility-Administered Medications  Medication Dose Route Frequency Provider Last Rate Last Dose  . 0.9 %  sodium chloride infusion  250 mL Intravenous PRN Jani Gravel, MD      . acetaminophen (TYLENOL) tablet 650 mg  650 mg Oral Q6H PRN Jani Gravel, MD       Or  . acetaminophen (TYLENOL) suppository  650 mg  650 mg Rectal Q6H PRN Jani Gravel, MD      . darifenacin (ENABLEX) 24 hr tablet 7.5 mg  7.5 mg Oral Daily Jani Gravel, MD   7.5 mg at 09/22/18 0951  . donepezil (ARICEPT) tablet 10 mg  10 mg Oral Loma Sousa, MD   10 mg at 09/22/18 0223  . ferrous sulfate tablet 325 mg  325 mg Oral Q breakfast Jani Gravel, MD   325 mg at 09/22/18  0943  . insulin aspart (novoLOG) injection 0-5 Units  0-5 Units Subcutaneous QHS Jani Gravel, MD   2 Units at 09/22/18 0046  . insulin aspart (novoLOG) injection 0-9 Units  0-9 Units Subcutaneous TID WC Jani Gravel, MD      . insulin glargine (LANTUS) injection 10 Units  10 Units Subcutaneous Daily Jani Gravel, MD      . loratadine (CLARITIN) tablet 10 mg  10 mg Oral q morning - 10a Jani Gravel, MD   10 mg at 09/22/18 0944  . ondansetron (ZOFRAN) injection 4 mg  4 mg Intravenous Q6H PRN Tat, David, MD      . pantoprazole (PROTONIX) EC tablet 40 mg  40 mg Oral Daily Jani Gravel, MD   40 mg at 09/22/18 0943  . perflutren lipid microspheres (DEFINITY) IV suspension  1-10 mL Intravenous PRN Jani Gravel, MD   1 mL at 09/22/18 1145  . polyvinyl alcohol (LIQUIFILM TEARS) 1.4 % ophthalmic solution 1 drop  1 drop Both Eyes QID PRN Tat, David, MD      . pravastatin (PRAVACHOL) tablet 40 mg  40 mg Oral q1800 Jani Gravel, MD      . sertraline (ZOLOFT) tablet 50 mg  50 mg Oral Daily Jani Gravel, MD   50 mg at 09/22/18 0943  . sodium chloride flush (NS) 0.9 % injection 3 mL  3 mL Intravenous Q12H Jani Gravel, MD   3 mL at 09/22/18 0021  . sodium chloride flush (NS) 0.9 % injection 3 mL  3 mL Intravenous PRN Jani Gravel, MD      . tamsulosin [facility]) capsule 0.4 mg  0.4 mg Oral q morning - 10a Jani Gravel, MD   0.4 mg at 09/22/18 5597  . Vitamin D (Ergocalciferol) (DRISDOL) capsule 50,000 Units  50,000 Units Oral Q7 days Jani Gravel, MD   50,000 Units at 09/22/18 4163     Discharge Medications: Please see discharge summary for a list of discharge medications.  Relevant Imaging Results:  Relevant Lab Results:   Additional Information SSN [id number], Clydene Pugh, LCSW

## 2018-09-22 NOTE — Progress Notes (Signed)
*  PRELIMINARY RESULTS* Echocardiogram 2D Echocardiogram with definity has been performed.  [name] 09/22/2018, 1:58 PM

## 2018-09-23 ENCOUNTER — Observation Stay (HOSPITAL_COMMUNITY): Payer: Medicare HMO

## 2018-09-23 ENCOUNTER — Other Ambulatory Visit (HOSPITAL_COMMUNITY): Payer: Self-pay

## 2018-09-23 DIAGNOSIS — I6523 Occlusion and stenosis of bilateral carotid arteries: Secondary | ICD-10-CM | POA: Diagnosis not present

## 2018-09-23 DIAGNOSIS — J9600 Acute respiratory failure, unspecified whether with hypoxia or hypercapnia: Secondary | ICD-10-CM | POA: Diagnosis not present

## 2018-09-23 LAB — GLUCOSE, CAPILLARY
GLUCOSE-CAPILLARY: 150 mg/dL — AB (ref 70–99)
Glucose-Capillary: 145 mg/dL — ABNORMAL HIGH (ref 70–99)
Glucose-Capillary: 182 mg/dL — ABNORMAL HIGH (ref 70–99)
Glucose-Capillary: 185 mg/dL — ABNORMAL HIGH (ref 70–99)

## 2018-09-23 LAB — BASIC METABOLIC PANEL
ANION GAP: 8 (ref 5–15)
BUN: 20 mg/dL (ref 8–23)
CALCIUM: 8.3 mg/dL — AB (ref 8.9–10.3)
CO2: 28 mmol/L (ref 22–32)
CREATININE: 1.89 mg/dL — AB (ref 0.61–1.24)
Chloride: 101 mmol/L (ref 98–111)
GFR calc Af Amer: 36 mL/min — ABNORMAL LOW (ref >60)
GFR, EST NON AFRICAN AMERICAN: 31 mL/min — AB (ref >60)
GLUCOSE: 161 mg/dL — AB (ref 70–99)
Potassium: 4.6 mmol/L (ref 3.5–5.1)
Sodium: 137 mmol/L (ref 135–145)

## 2018-09-23 MED ORDER — SODIUM CHLORIDE 0.9 % IV SOLN
INTRAVENOUS | Status: AC
  Administered 2018-09-23 (×2): via INTRAVENOUS

## 2018-09-23 MED ORDER — LEVALBUTEROL HCL 0.63 MG/3ML IN NEBU
0.6300 mg | INHALATION_SOLUTION | Freq: Three times a day (TID) | RESPIRATORY_TRACT | Status: DC
  Administered 2018-09-23 (×2): 0.63 mg via RESPIRATORY_TRACT
  Filled 2018-09-23 (×2): qty 3

## 2018-09-23 MED ORDER — ASPIRIN EC 81 MG PO TBEC
81.0000 mg | DELAYED_RELEASE_TABLET | Freq: Every day | ORAL | Status: DC
  Administered 2018-09-23 – 2018-09-26 (×4): 81 mg via ORAL
  Filled 2018-09-23 (×4): qty 1

## 2018-09-23 MED ORDER — SENNA 8.6 MG PO TABS
2.0000 | ORAL_TABLET | Freq: Every day | ORAL | Status: DC
  Administered 2018-09-23 – 2018-09-26 (×3): 17.2 mg via ORAL
  Filled 2018-09-23 (×4): qty 2

## 2018-09-23 MED ORDER — IPRATROPIUM BROMIDE 0.02 % IN SOLN
0.5000 mg | Freq: Three times a day (TID) | RESPIRATORY_TRACT | Status: DC
  Administered 2018-09-23 (×2): 0.5 mg via RESPIRATORY_TRACT
  Filled 2018-09-23 (×2): qty 2.5

## 2018-09-23 MED ORDER — POLYETHYLENE GLYCOL 3350 17 G PO PACK
17.0000 g | PACK | Freq: Every day | ORAL | Status: DC
  Administered 2018-09-23 – 2018-09-26 (×3): 17 g via ORAL
  Filled 2018-09-23 (×4): qty 1

## 2018-09-23 MED ORDER — SODIUM CHLORIDE 0.9 % IV BOLUS
500.0000 mL | Freq: Once | INTRAVENOUS | Status: AC
  Administered 2018-09-23: 500 mL via INTRAVENOUS

## 2018-09-23 NOTE — Progress Notes (Signed)
PROGRESS NOTE  Samuel Ryan XTG:[id number] DOB: [birth year] DOA: 09/21/2018 PCP: [name], MD Brief History:  84 year old male with a history of dementia, hypertension, hyperlipidemia, CKD stage III, type is mellitus type II, pulmonary fibrosis, and DVT July 2012 presented with 1 week history of increasing somnolence, forgetfulness, and decreased oral intake.  Patient was recently admitted to the hospital from 08/03/2018 through 08/08/2018 when the patient was found on the floor encephalopathic.  It was thought to be due to dehydration and acute on chronic renal failure.  The patient was hydrated and subsequently discharged to skilled nursing facility.  He has since returned home.  The patient son also has noted that he has been dragging his left leg with increasing gait instability in the past week.  The patient has not had any falls since his last admission it.  At baseline, the patient is alert and oriented x2 and able to dress himself and feed himself.  He does require assistance with bathing.  The patient was taken to see his primary care provider on 09/20/2018.  Urinalysis at that time was negative.  In the emergency department, the patient was noted to be tachycardic and hypoxic with oxygen saturation 89%.  BMP showed sodium 133, serum creatinine 1.85, WBC 5.5.  Assessment/Plan: Acute metabolic encephalopathy -Likely multifactorial including hypoxia, dehydration, and medication induced from his anticholinergic medications -Repeat serum B12--456 -Check ammonia--10 -Personally reviewed EKG--sinus rhythm, right bundle branch block unchanged -Personally reviewed chest x-ray--chronic bibasilar atelectasis and increased interstitial markings unchanged -EVO--3.500 -Folic XFGH--82.9 -93/71/6967 urinalysis negative for pyuria -CT brain neg -unable to perform MRI due to inability to lay flat  Acute respiratory failure with hypoxia -VQ scan--very low probability -May be due to  progression of his pulmonary fibrosis -10/24 ABG--7.40/43/78/24 on 2L -remains on 2L -CT chest -viral respiratory panel -start xopenex  Dysphagia -concerned about aspiration pneumonitis -speech therapy eval  Dehydration -po intake remains poor -start IVF  Hyponatremia -Secondary to volume depletion -Continue IV fluids-->improving  Coagulopathy -PTT 144 at time of admission -likely spurious as pt was started on IV heparin @0059   CKD stage III -Baseline creatinine 1.5-1.8 -A.m. BMP  Diabetes mellitus type 2, uncontrolled with hyperglycemia -08/13/2018 hemoglobin A1c 8.0 -Continue Lantus 10 units daily -NovoLog sliding scale  Hyperlipidemia -Continue statin  -am lipid panel  Dementia without behavioral disturbance -Patient is at risk for hospital delirium -Continue Aricept  Elevated D-dimer -venous duplex lower extremities--neg    Disposition Plan:   Home vs SNF 1-2 days  Family Communication:   Niece updated at bedside--Total time spent 35 minutes.  Greater than 50% spent face to face counseling and coordinating care.    Consultants:  none  Code Status:  FULL   DVT Prophylaxis:  IV Heparin   Procedures: As Listed in Progress Note Above  Antibiotics: None  Subjective: Patient denies fevers, chills, headache, chest pain, dyspnea, nausea, vomiting, diarrhea, abdominal pain, dysuria, hematuria, hematochezia, and melena.   Objective: Vitals:   09/22/18 0805 09/22/18 1300 09/22/18 2136 09/23/18 0531  BP:  (!) 142/89 (!) 142/89 126/70  Pulse:  (!) 104 (!) 116 74  Resp:  18 18 20   Temp:  98.2 F (36.8 C) 98 F (36.7 C) 97.7 F (36.5 C)  TempSrc:  Oral Oral Oral  SpO2: 93% 95% 94% 100%  Weight:    112.6 kg    Intake/Output Summary (Last 24 hours) at 09/23/2018 1151 Last data filed at 09/23/2018 8938  Gross per 24 hour  Intake 480 ml  Output 1600 ml  Net -1120 ml   Weight change: -5.335 kg Exam:   General:  Pt is alert,  follows commands appropriately, not in acute distress  HEENT: No icterus, No thrush, No neck mass, [place]/AT  Cardiovascular: RRR, S1/S2, no rubs, no gallops  Respiratory: bilateral crakcles, mild basilar wheeze  Abdomen: Soft/+BS, non tender, non distended, no guarding  Extremities: No edema, No lymphangitis, No petechiae, No rashes, no synovitis   Data Reviewed: I have personally reviewed following labs and imaging studies Basic Metabolic Panel: Recent Labs  Lab 09/21/18 1914 09/22/18 0643 09/23/18 0608  NA 133* 138 137  K 4.8 4.4 4.6  CL 103 103 101  CO2 24 28 28   GLUCOSE 300* 116* 161*  BUN 21 19 20   CREATININE 1.85* 1.77* 1.89*  CALCIUM 8.1* 8.4* 8.3*   Liver Function Tests: Recent Labs  Lab 09/21/18 1914 09/22/18 0643  AST 16 17  ALT 13 14  ALKPHOS 65 64  BILITOT 0.6 0.8  PROT 7.2 7.6  ALBUMIN 2.8* 2.9*   No results for input(s): LIPASE, AMYLASE in the last 168 hours. Recent Labs  Lab 09/22/18 0953  AMMONIA 10   Coagulation Profile: Recent Labs  Lab 09/22/18 0108 09/22/18 0953  INR 1.16 1.09   CBC: Recent Labs  Lab 09/21/18 1914 09/22/18 0643  WBC 5.5 5.2  NEUTROABS 3.1  --   HGB 8.7* 10.6*  HCT 27.8* 34.3*  MCV 97.5 95.0  PLT 264 246   Cardiac Enzymes: Recent Labs  Lab 09/21/18 1914 09/22/18 0021 09/22/18 0643 09/22/18 0953 09/22/18 1217  CKTOTAL  --   --   --  61  --   TROPONINI <0.03 <0.03 <0.03  --  <0.03   BNP: Invalid input(s): POCBNP CBG: Recent Labs  Lab 09/22/18 1127 09/22/18 1630 09/22/18 2142 09/23/18 0736 09/23/18 1123  GLUCAP 162* 142* 164* 145* 150*   HbA1C: No results for input(s): HGBA1C in the last 72 hours. Urine analysis:    Component Value Date/Time   COLORURINE YELLOW 09/20/2018 1800   APPEARANCEUR CLEAR 09/20/2018 1800   LABSPEC 1.012 09/20/2018 1800   PHURINE 6.0 09/20/2018 1800   GLUCOSEU NEGATIVE 09/20/2018 1800   HGBUR NEGATIVE 09/20/2018 1800   BILIRUBINUR NEGATIVE 09/20/2018 1800    BILIRUBINUR neg 12/12/2017 1516   KETONESUR NEGATIVE 09/20/2018 1800   PROTEINUR NEGATIVE 09/20/2018 1800   UROBILINOGEN 1.0 12/12/2017 1516   UROBILINOGEN 0.2 06/02/2015 1310   NITRITE NEGATIVE 09/20/2018 1800   LEUKOCYTESUR NEGATIVE 09/20/2018 1800   Sepsis Labs: @LABRCNTIP (procalcitonin:4,lacticidven:4) ) Recent Results (from the past 240 hour(s))  Culture, Urine     Status: Abnormal   Collection Time: 09/20/18  7:00 PM  Result Value Ref Range Status   Specimen Description   Final    URINE, RANDOM Performed at [facility], [address] [place], [place] [address]    Special Requests   Final    NONE Performed at [facility], [address] [place], [place] [address]    Culture (A)  Final    <10,000 COLONIES/mL INSIGNIFICANT GROWTH Performed at [facility], [place] [address] [place], [place] [address]    Report Status 09/22/2018 FINAL  Final     Scheduled Meds: . darifenacin  7.5 mg Oral Daily  . donepezil  10 mg Oral QHS  . ferrous sulfate  325 mg Oral Q breakfast  . insulin aspart  0-5 Units Subcutaneous QHS  . insulin aspart  0-9 Units Subcutaneous TID WC  . insulin glargine  10 Units Subcutaneous Daily  . loratadine  10 mg Oral q morning - 10a  . pantoprazole  40 mg Oral Daily  . polyethylene glycol  17 g Oral Daily  . pravastatin  40 mg Oral q1800  . senna  2 tablet Oral Daily  . sertraline  50 mg Oral Daily  . sodium chloride flush  3 mL Intravenous Q12H  . tamsulosin  0.4 mg Oral q morning - 10a  . Vitamin D (Ergocalciferol)  50,000 Units Oral Q7 days   Continuous Infusions: . sodium chloride    . sodium chloride      Procedures/Studies: Dg Chest 2 View  Result Date: 09/21/2018 CLINICAL DATA:  Weakness and confusion. EXAM: CHEST - 2 VIEW COMPARISON:  August 11, 2018 FINDINGS: Stable bibasilar changes, likely scar atelectasis. Stable cardiomegaly. No change in the mediastinum. No pneumothorax. IMPRESSION: Chronic bibasilar atelectasis or scar.   No acute change. Electronically Signed   By: Dorise Bullion III M.D   On: 09/21/2018 20:08   Ct Head Wo Contrast  Result Date: 09/21/2018 CLINICAL DATA:  Five-day history of confusion. EXAM: CT HEAD WITHOUT CONTRAST TECHNIQUE: Contiguous axial images were obtained from the base of the skull through the vertex without intravenous contrast. COMPARISON:  Head CT 08/03/2018 FINDINGS: Brain: Stable age related cerebral atrophy, ventriculomegaly and periventricular white matter disease. No extra-axial fluid collections are identified. No CT findings for acute hemispheric infarction or intracranial hemorrhage. No mass lesions. Stable bilateral basal ganglia calcifications. The brainstem and cerebellum are normal. Vascular: No hyperdense vessel or unexpected calcification. Skull: No skull fracture or bone lesion. Stable hyperostosis frontalis interna. Sinuses/Orbits: The paranasal sinuses and mastoid air cells are grossly clear. The globes are intact. Other: No scalp lesions or hematoma. IMPRESSION: 1. Stable cerebral atrophy, ventriculomegaly and periventricular white matter disease. 2. No new/acute intracranial findings. Electronically Signed   By: Marijo Sanes M.D.   On: 09/21/2018 20:38   Nm Pulmonary Perf And Vent  Result Date: 09/22/2018 CLINICAL DATA:  Shortness of breath, weakness, elevated D-dimer, suspected pulmonary embolism EXAM: NUCLEAR MEDICINE VENTILATION - PERFUSION LUNG SCAN TECHNIQUE: Ventilation images were obtained in multiple projections using inhaled aerosol Tc-83m DTPA. Perfusion images were obtained in multiple projections after intravenous injection of Tc-60m-MAA. RADIOPHARMACEUTICALS:  33 mCi of Tc-45m DTPA aerosol inhalation and 4.4 mCi Tc48m-MAA IV COMPARISON:  None Correlation: Chest radiograph 09/21/2018 FINDINGS: Ventilation: Central airway deposition of aerosol. Poor peripheral distribution of aerosol to upper lobes. Diminished ventilation LEFT lower lobe medially. Perfusion: No  segmental or subsegmental perfusion defects. Minimal irregularity of perfusion at the posterior RIGHT lung, nonsegmental. IMPRESSION: Very low probability for pulmonary embolism. Electronically Signed   By: Lavonia Dana M.D.   On: 09/22/2018 09:54   US Venous Img Lower Bilateral  Result Date: 09/22/2018 CLINICAL DATA:  Bilateral lower extremity pain. EXAM: BILATERAL LOWER EXTREMITY VENOUS DOPPLER ULTRASOUND TECHNIQUE: Gray-scale sonography with graded compression, as well as color Doppler and duplex ultrasound were performed to evaluate the lower extremity deep venous systems from the level of the common femoral vein and including the common femoral, femoral, profunda femoral, popliteal and calf veins including the posterior tibial, peroneal and gastrocnemius veins when visible. The superficial great saphenous vein was also interrogated. Spectral Doppler was utilized to evaluate flow at rest and with distal augmentation maneuvers in the common femoral, femoral and popliteal veins. COMPARISON:  None. FINDINGS: RIGHT LOWER EXTREMITY Common Femoral Vein: No evidence of thrombus.  Normal compressibility, respiratory phasicity and response to augmentation. Saphenofemoral Junction: No evidence of thrombus. Normal compressibility and flow on color Doppler imaging. Profunda Femoral Vein: No evidence of thrombus. Normal compressibility and flow on color Doppler imaging. Femoral Vein: No evidence of thrombus. Normal compressibility, respiratory phasicity and response to augmentation. Popliteal Vein: No evidence of thrombus. Normal compressibility, respiratory phasicity and response to augmentation. Calf Veins: No evidence of thrombus. Normal compressibility and flow on color Doppler imaging. Superficial Great Saphenous Vein: No evidence of thrombus. Normal compressibility. Venous Reflux:  None. Other Findings:  None. LEFT LOWER EXTREMITY Common Femoral Vein: No evidence of thrombus. Normal compressibility, respiratory  phasicity and response to augmentation. Saphenofemoral Junction: No evidence of thrombus. Normal compressibility and flow on color Doppler imaging. Profunda Femoral Vein: No evidence of thrombus. Normal compressibility and flow on color Doppler imaging. Femoral Vein: No evidence of thrombus. Normal compressibility, respiratory phasicity and response to augmentation. Popliteal Vein: No evidence of thrombus. Normal compressibility, respiratory phasicity and response to augmentation. Calf Veins: No evidence of thrombus. Normal compressibility and flow on color Doppler imaging. Superficial Great Saphenous Vein: No evidence of thrombus. Normal compressibility. Venous Reflux:  None. Other Findings:  None. IMPRESSION: No evidence of deep venous thrombosis. Electronically Signed   By: Marijo Conception, M.D.   On: 09/22/2018 14:27    Orson Eva, DO  [facility] Pager [phone]  If 7PM-7AM, please contact night-coverage [email or web address] Password [email or web address] 09/23/2018, 11:51 AM   LOS: 0 days

## 2018-09-24 DIAGNOSIS — K219 Gastro-esophageal reflux disease without esophagitis: Secondary | ICD-10-CM | POA: Diagnosis present

## 2018-09-24 DIAGNOSIS — E871 Hypo-osmolality and hyponatremia: Secondary | ICD-10-CM | POA: Diagnosis present

## 2018-09-24 DIAGNOSIS — E860 Dehydration: Secondary | ICD-10-CM | POA: Diagnosis present

## 2018-09-24 DIAGNOSIS — F0390 Unspecified dementia without behavioral disturbance: Secondary | ICD-10-CM | POA: Diagnosis not present

## 2018-09-24 DIAGNOSIS — J690 Pneumonitis due to inhalation of food and vomit: Secondary | ICD-10-CM | POA: Diagnosis present

## 2018-09-24 DIAGNOSIS — D689 Coagulation defect, unspecified: Secondary | ICD-10-CM | POA: Diagnosis present

## 2018-09-24 DIAGNOSIS — R4182 Altered mental status, unspecified: Secondary | ICD-10-CM | POA: Diagnosis present

## 2018-09-24 DIAGNOSIS — F0280 Dementia in other diseases classified elsewhere without behavioral disturbance: Secondary | ICD-10-CM | POA: Diagnosis present

## 2018-09-24 DIAGNOSIS — E1165 Type 2 diabetes mellitus with hyperglycemia: Secondary | ICD-10-CM | POA: Diagnosis present

## 2018-09-24 DIAGNOSIS — M1990 Unspecified osteoarthritis, unspecified site: Secondary | ICD-10-CM | POA: Diagnosis present

## 2018-09-24 DIAGNOSIS — I471 Supraventricular tachycardia: Secondary | ICD-10-CM | POA: Diagnosis present

## 2018-09-24 DIAGNOSIS — T443X5A Adverse effect of other parasympatholytics [anticholinergics and antimuscarinics] and spasmolytics, initial encounter: Secondary | ICD-10-CM | POA: Diagnosis present

## 2018-09-24 DIAGNOSIS — Z981 Arthrodesis status: Secondary | ICD-10-CM | POA: Diagnosis not present

## 2018-09-24 DIAGNOSIS — R1310 Dysphagia, unspecified: Secondary | ICD-10-CM | POA: Diagnosis present

## 2018-09-24 DIAGNOSIS — I4510 Unspecified right bundle-branch block: Secondary | ICD-10-CM | POA: Diagnosis present

## 2018-09-24 DIAGNOSIS — I129 Hypertensive chronic kidney disease with stage 1 through stage 4 chronic kidney disease, or unspecified chronic kidney disease: Secondary | ICD-10-CM | POA: Diagnosis present

## 2018-09-24 DIAGNOSIS — N400 Enlarged prostate without lower urinary tract symptoms: Secondary | ICD-10-CM | POA: Diagnosis present

## 2018-09-24 DIAGNOSIS — J9811 Atelectasis: Secondary | ICD-10-CM | POA: Diagnosis present

## 2018-09-24 DIAGNOSIS — Z96651 Presence of right artificial knee joint: Secondary | ICD-10-CM | POA: Diagnosis present

## 2018-09-24 DIAGNOSIS — G309 Alzheimer's disease, unspecified: Secondary | ICD-10-CM | POA: Diagnosis present

## 2018-09-24 DIAGNOSIS — D631 Anemia in chronic kidney disease: Secondary | ICD-10-CM | POA: Diagnosis present

## 2018-09-24 DIAGNOSIS — G9341 Metabolic encephalopathy: Secondary | ICD-10-CM | POA: Diagnosis present

## 2018-09-24 DIAGNOSIS — N183 Chronic kidney disease, stage 3 (moderate): Secondary | ICD-10-CM | POA: Diagnosis present

## 2018-09-24 DIAGNOSIS — E782 Mixed hyperlipidemia: Secondary | ICD-10-CM | POA: Diagnosis present

## 2018-09-24 DIAGNOSIS — E1122 Type 2 diabetes mellitus with diabetic chronic kidney disease: Secondary | ICD-10-CM | POA: Diagnosis present

## 2018-09-24 DIAGNOSIS — J9601 Acute respiratory failure with hypoxia: Secondary | ICD-10-CM | POA: Diagnosis present

## 2018-09-24 LAB — RESPIRATORY PANEL BY PCR
ADENOVIRUS-RVPPCR: NOT DETECTED
Bordetella pertussis: NOT DETECTED
CHLAMYDOPHILA PNEUMONIAE-RVPPCR: NOT DETECTED
CORONAVIRUS 229E-RVPPCR: NOT DETECTED
CORONAVIRUS HKU1-RVPPCR: NOT DETECTED
CORONAVIRUS NL63-RVPPCR: NOT DETECTED
CORONAVIRUS OC43-RVPPCR: NOT DETECTED
Influenza A: NOT DETECTED
Influenza B: NOT DETECTED
MYCOPLASMA PNEUMONIAE-RVPPCR: NOT DETECTED
Metapneumovirus: NOT DETECTED
PARAINFLUENZA VIRUS 1-RVPPCR: NOT DETECTED
Parainfluenza Virus 2: NOT DETECTED
Parainfluenza Virus 3: NOT DETECTED
Parainfluenza Virus 4: NOT DETECTED
Respiratory Syncytial Virus: NOT DETECTED
Rhinovirus / Enterovirus: NOT DETECTED

## 2018-09-24 LAB — LIPID PANEL
CHOL/HDL RATIO: 4.1 ratio
Cholesterol: 112 mg/dL (ref 0–200)
HDL: 27 mg/dL — ABNORMAL LOW (ref >40)
LDL CALC: 65 mg/dL (ref 0–99)
TRIGLYCERIDES: 98 mg/dL (ref <150)
VLDL: 20 mg/dL (ref 0–40)

## 2018-09-24 LAB — GLUCOSE, CAPILLARY
Glucose-Capillary: 157 mg/dL — ABNORMAL HIGH (ref 70–99)
Glucose-Capillary: 177 mg/dL — ABNORMAL HIGH (ref 70–99)
Glucose-Capillary: 147 mg/dL — ABNORMAL HIGH (ref 70–99)
Glucose-Capillary: 161 mg/dL — ABNORMAL HIGH (ref 70–99)

## 2018-09-24 LAB — BASIC METABOLIC PANEL
Anion gap: 7 (ref 5–15)
BUN: 20 mg/dL (ref 8–23)
CHLORIDE: 104 mmol/L (ref 98–111)
CO2: 24 mmol/L (ref 22–32)
Calcium: 8 mg/dL — ABNORMAL LOW (ref 8.9–10.3)
Creatinine, Ser: 1.74 mg/dL — ABNORMAL HIGH (ref 0.61–1.24)
GFR calc Af Amer: 39 mL/min — ABNORMAL LOW (ref >60)
GFR calc non Af Amer: 34 mL/min — ABNORMAL LOW (ref >60)
Glucose, Bld: 154 mg/dL — ABNORMAL HIGH (ref 70–99)
POTASSIUM: 4.7 mmol/L (ref 3.5–5.1)
Sodium: 135 mmol/L (ref 135–145)

## 2018-09-24 LAB — MAGNESIUM: MAGNESIUM: 2.3 mg/dL (ref 1.7–2.4)

## 2018-09-24 MED ORDER — CLINDAMYCIN PHOSPHATE 600 MG/50ML IV SOLN
600.0000 mg | Freq: Three times a day (TID) | INTRAVENOUS | Status: DC
  Administered 2018-09-24 – 2018-09-26 (×7): 600 mg via INTRAVENOUS
  Filled 2018-09-24 (×7): qty 50

## 2018-09-24 MED ORDER — IPRATROPIUM BROMIDE 0.02 % IN SOLN
0.5000 mg | Freq: Two times a day (BID) | RESPIRATORY_TRACT | Status: DC
  Administered 2018-09-24 – 2018-09-26 (×5): 0.5 mg via RESPIRATORY_TRACT
  Filled 2018-09-24 (×5): qty 2.5

## 2018-09-24 MED ORDER — LEVALBUTEROL HCL 0.63 MG/3ML IN NEBU
0.6300 mg | INHALATION_SOLUTION | Freq: Two times a day (BID) | RESPIRATORY_TRACT | Status: DC
  Administered 2018-09-24 – 2018-09-26 (×5): 0.63 mg via RESPIRATORY_TRACT
  Filled 2018-09-24 (×5): qty 3

## 2018-09-24 NOTE — Progress Notes (Signed)
Have decreased nebs to twice a day. Will add incentive for atelectasis. Patient never smoked, has had PE in past.

## 2018-09-24 NOTE — Plan of Care (Signed)
  Problem: Acute Rehab PT Goals(only PT should resolve) Goal: Pt Will Go Supine/Side To Sit Outcome: Progressing Flowsheets (Taken 09/24/2018 1022) Pt will go Supine/Side to Sit: with supervision; with HOB elevated Goal: Pt Will Go Sit To Supine/Side Outcome: Progressing Flowsheets (Taken 09/24/2018 1022) Pt will go Sit to Supine/Side: with supervision; with [place]  elevated Goal: Patient Will Transfer Sit To/From Stand Outcome: Progressing Flowsheets (Taken 09/24/2018 1022) Patient will transfer sit to/from stand: with min guard assist; from elevated surface Note:  With RW Goal: Pt Will Transfer Bed To Chair/Chair To Bed Outcome: Progressing Flowsheets (Taken 09/24/2018 1022) Pt will Transfer Bed to Chair/Chair to Bed: with min assist Note:  With RW

## 2018-09-24 NOTE — Evaluation (Signed)
Physical Therapy Evaluation Patient Details Name: Samuel Ryan MRN: [id number] DOB: [birth year] Today's Date: 09/24/2018   History of Present Illness  84 year old male with a history of dementia, hypertension, hyperlipidemia, CKD stage III, type is mellitus type II, pulmonary fibrosis, and DVT July 2012 presented with 1 week history of increasing somnolence, forgetfulness, and decreased oral intake.  Patient was recently admitted to the hospital from 08/03/2018 through 08/08/2018 when the patient was found on the floor encephalopathic.  It was thought to be due to dehydration and acute on chronic renal failure.  The patient was hydrated and subsequently discharged to skilled nursing facility.  He has since returned home.  The patient son also has noted that he has been dragging his left leg with increasing gait instability in the past week.  The patient has not had any falls since his last admission it.  At baseline, the patient is alert and oriented x2 and able to dress himself and feed himself.  He does require assistance with bathing.  The patient was taken to see his primary care provider on 09/20/2018.  Urinalysis at that time was negative.  In the emergency department, the patient was noted to be tachycardic and hypoxic with oxygen saturation 89%.  BMP showed sodium 133, serum creatinine 1.85, WBC 5.5.    Clinical Impression  Samuel Ryan is a 84 y.o. presenting for PT evaluation with recent decrease in functional mobility secondary to admission for hypoxia, and increased weakness over last week. He is currently functioning below his self reported baseline of modified independent with rollator for household mobility, with assistance for bathing, dressing, and meal preparation. He now requires moderate assistance for bed mobility and sit to stand transfers with RW. At home he sleeps in recliner/lift chair and uses a bedside commode. He performed multiple sit to stand transfers today form elevated bed and  was able to maintain standing for ~2 minutes, multiple bouts, for RN to assist with hygiene after multiple BM's. He will benefit from skilled PT to address current impairments and from follow up PT at below venue to address mobility and progress towards PLOF. Acute PT will follow.     Follow Up Recommendations SNF    Equipment Recommendations  None recommended by PT    Recommendations for Other Services       Precautions / Restrictions Precautions Precautions: Fall Restrictions Weight Bearing Restrictions: No      Mobility  Bed Mobility Overal bed mobility: Needs Assistance Bed Mobility: Rolling;Supine to Sit;Sit to Supine Rolling: Mod assist   Supine to sit: Mod assist;HOB elevated Sit to supine: Mod assist;HOB elevated   General bed mobility comments: patient requires assistance for sequencing bed mobility and to maintain balance, he was limited by fatigue  Transfers Overall transfer level: Needs assistance   Transfers: Sit to/from Stand Sit to Stand: Mod assist;From elevated surface;+2 safety/equipment         General transfer comment: Pt requires moderate assitance with sit to stand from EOB with RW for support. He requires repeated cues to maintain posture and balance with RW. Multiple transfers performed throughout session wiht assistance from RN, Lauren, as patient experienced multiple BM thorughout      Balance Overall balance assessment: Needs assistance Sitting-balance support: Bilateral upper extremity supported;Feet supported Sitting balance-Leahy Scale: Fair   Postural control: Posterior lean Standing balance support: Bilateral upper extremity supported Standing balance-Leahy Scale: Poor Standing balance comment: pt reliant on RW for support and moderate physical assistance to improve posture and  deter LOB posteriorly         Pertinent Vitals/Pain Pain Assessment: No/denies pain    Home Living Family/patient expects to be discharged to::  Private residence Living Arrangements: Children(pt's son) Available Help at Discharge: Family;Available PRN/intermittently;Personal care attendant(pt has in home aid, Monday-Friday per week for 3 hours/day) Type of Home: House Home Access: Stairs to enter Entrance Stairs-Rails: None Entrance Stairs-Number of Steps: 1 at side entrance, no rail Home Layout: One level Home Equipment: Walker - 2 wheels;Bedside commode;Walker - 4 wheels Additional Comments: pt provided history and PLOF    Prior Function Level of Independence: Independent with assistive device(s)         Comments: is PRN at home iwth supplemental oxygen (2L/min) he reports using rollator to mobilize in home and outside of home when neede, he reports he requires assistance for bathing, dressing, and meal preparation at baseline     Hand Dominance   Dominant Hand: Right    Extremity/Trunk Assessment   Upper Extremity Assessment Upper Extremity Assessment: Overall WFL for tasks assessed    Lower Extremity Assessment Lower Extremity Assessment: Generalized weakness    Cervical / Trunk Assessment Cervical / Trunk Assessment: Kyphotic  Communication   Communication: No difficulties  Cognition Arousal/Alertness: Awake/alert Behavior During Therapy: WFL for tasks assessed/performed Overall Cognitive Status: Within Functional Limits for tasks assessed               Assessment/Plan    PT Assessment Patient needs continued PT services  PT Problem List Decreased strength;Decreased knowledge of use of DME;Obesity;Decreased activity tolerance;Decreased balance;Decreased mobility       PT Treatment Interventions DME instruction;Balance training;Gait training;Functional mobility training;Therapeutic activities;Therapeutic exercise;Patient/family education    PT Goals (Current goals can be found in the Care Plan section)  Acute Rehab PT Goals Patient Stated Goal: To o improve strength and be able to walk around  home again with rollator PT Goal Formulation: With patient Time For Goal Achievement: 09/29/18 Potential to Achieve Goals: Fair    Frequency Min 3X/week    AM-PAC PT "6 Clicks" Daily Activity  Outcome Measure Difficulty turning over in bed (including adjusting bedclothes, sheets and blankets)?: Unable Difficulty moving from lying on back to sitting on the side of the bed? : Unable Difficulty sitting down on and standing up from a chair with arms (e.g., wheelchair, bedside commode, etc,.)?: Unable Help needed moving to and from a bed to chair (including a wheelchair)?: A Lot Help needed walking in hospital room?: Total Help needed climbing 3-5 steps with a railing? : Total 6 Click Score: 7    End of Session Equipment Utilized During Treatment: Gait belt;Other (comment)(RW) Activity Tolerance: Patient tolerated treatment well;Patient limited by fatigue Patient left: in bed;with call bell/phone within reach;with nursing/sitter in room Nurse Communication: Mobility status PT Visit Diagnosis: Unsteadiness on feet (R26.81);Difficulty in walking, not elsewhere classified (R26.2);Muscle weakness (generalized) (M62.81);Other abnormalities of gait and mobility (R26.89)    Time: 2440-1027 PT Time Calculation (min) (ACUTE ONLY): 39 min   Charges:   PT Evaluation $PT Eval Low Complexity: 1 Low PT Treatments $Therapeutic Activity: 23-37 mins        Kipp Brood, PT, DPT Physical Therapist with [facility]  09/24/2018 10:29 AM

## 2018-09-24 NOTE — Progress Notes (Signed)
PROGRESS NOTE  Samuel Ryan TDV:[id number] DOB: 1931/04/14 DOA: 09/21/2018 PCP: [name], MD  Brief History: 84 year old male with a history of dementia, hypertension, hyperlipidemia, CKD stage III, type is mellitus type II, pulmonary fibrosis, and DVT July 2012 presented with 1 week history of increasing somnolence, forgetfulness, and decreased oral intake. Patient was recently admitted to the hospital from 08/03/2018 through 08/08/2018 when the patient was found on the floor encephalopathic. It was thought to be due to dehydration and acute on chronic renal failure. The patient was hydrated and subsequently discharged to skilled nursing facility. He has since returned home. The patient son also has noted that he has been dragging his left leg with increasing gait instability in the past week. The patient has not had any falls since his last admission it. At baseline, the patient is alert and oriented x2 and able to dress himself and feed himself. He does require assistance with bathing. The patient was taken to see his primary care provider on 09/20/2018. Urinalysis at that time was negative. In the emergency department, the patient was noted to be tachycardic and hypoxic with oxygen saturation 89%. BMP showed sodium 133, serum creatinine 1.85, WBC 5.5.  Assessment/Plan: Acute metabolic encephalopathy -Likely multifactorial including hypoxia, dehydration, and medication induced from his anticholinergic medications -Repeat serum B12--456 -Check ammonia--10 -Personally reviewed EKG--sinus rhythm, right bundle branch block unchanged -Personally reviewed chest x-ray--chronic bibasilar atelectasisand increased interstitial markings unchanged -GGY--6.948 -Folic NIOE--70.3 -50/07/3817 urinalysis negative for pyuria -CT brain neg -unable to perform MRI due to inability to lay flat  Acute respiratory failure with hypoxia -secondary to aspiration pneumonitis -VQ  scan--very low probability -underlying ILD also contributing -09/23/18 CT chest--LUL and lingular patchy ground glass opacities -10/24 ABG--7.40/43/78/24 on 2L -remains on 2L -viral respiratory panel -continue xopenex  Aspiration pneumonitis -start IV clinda  Dysphagia -concerned about aspiration pneumonitis -speech therapy eval  Dehydration -po intake remains poor -started IVF  Hyponatremia -Secondary to volume depletion -Continue IV fluids-->improving  Coagulopathy -PTT 144 at time of admission -likely spurious as pt was started on IV heparin @0059   CKD stage III -Baseline creatinine 1.5-1.8 -A.m. BMP  Diabetes mellitus type 2,uncontrolled with hyperglycemia -08/13/2018 hemoglobin A1c 8.0 -Continue Lantus 10 units daily -NovoLog sliding scale  Hyperlipidemia -Continue statin  -am lipid panel  Dementia without behavioral disturbance -Patient is at risk for hospital delirium -Continue Aricept  Elevated D-dimer -venous duplex lower extremities--neg    Disposition Plan: SNF 10/28 if stable Family Communication:son updated at bedside    Consultants:none  Code Status: FULL   DVT Prophylaxis:IVHeparin   Subjective: Patient denies fevers, chills, headache, chest pain, dyspnea, nausea, vomiting, diarrhea, abdominal pain, dysuria, hematuria, hematochezia, and melena.   Objective: Vitals:   09/23/18 2109 09/23/18 2234 09/24/18 0655 09/24/18 0814  BP: 130/77  122/70   Pulse: 75  79   Resp: 18  19   Temp:   98.2 F (36.8 C)   TempSrc:   Oral   SpO2: 100% 100% 95% 94%  Weight:   114.8 kg     Intake/Output Summary (Last 24 hours) at 09/24/2018 1338 Last data filed at 09/24/2018 1245 Gross per 24 hour  Intake 2650.91 ml  Output 1204 ml  Net 1446.91 ml   Weight change: 2.2 kg Exam:   General:  Pt is alert, follows commands appropriately, not in acute distress  HEENT: No icterus, No thrush, No neck mass,  [place]/AT  Cardiovascular: RRR, S1/S2,  no rubs, no gallops  Respiratory: bilateral scattered rales, no wheeze  Abdomen: Soft/+BS, non tender, non distended, no guarding  Extremities: No edema, No lymphangitis, No petechiae, No rashes, no synovitis   Data Reviewed: I have personally reviewed following labs and imaging studies Basic Metabolic Panel: Recent Labs  Lab 09/21/18 1914 09/22/18 0643 09/23/18 0608 09/24/18 0603  NA 133* 138 137 135  K 4.8 4.4 4.6 4.7  CL 103 103 101 104  CO2 24 28 28 24   GLUCOSE 300* 116* 161* 154*  BUN 21 19 20 20   CREATININE 1.85* 1.77* 1.89* 1.74*  CALCIUM 8.1* 8.4* 8.3* 8.0*  MG  --   --   --  2.3   Liver Function Tests: Recent Labs  Lab 09/21/18 1914 09/22/18 0643  AST 16 17  ALT 13 14  ALKPHOS 65 64  BILITOT 0.6 0.8  PROT 7.2 7.6  ALBUMIN 2.8* 2.9*   No results for input(s): LIPASE, AMYLASE in the last 168 hours. Recent Labs  Lab 09/22/18 0953  AMMONIA 10   Coagulation Profile: Recent Labs  Lab 09/22/18 0108 09/22/18 0953  INR 1.16 1.09   CBC: Recent Labs  Lab 09/21/18 1914 09/22/18 0643  WBC 5.5 5.2  NEUTROABS 3.1  --   HGB 8.7* 10.6*  HCT 27.8* 34.3*  MCV 97.5 95.0  PLT 264 246   Cardiac Enzymes: Recent Labs  Lab 09/21/18 1914 09/22/18 0021 09/22/18 0643 09/22/18 0953 09/22/18 1217  CKTOTAL  --   --   --  61  --   TROPONINI <0.03 <0.03 <0.03  --  <0.03   BNP: Invalid input(s): POCBNP CBG: Recent Labs  Lab 09/23/18 1123 09/23/18 1632 09/23/18 2036 09/24/18 0723 09/24/18 1123  GLUCAP 150* 182* 185* 157* 177*   HbA1C: No results for input(s): HGBA1C in the last 72 hours. Urine analysis:    Component Value Date/Time   COLORURINE YELLOW 09/20/2018 1800   APPEARANCEUR CLEAR 09/20/2018 1800   LABSPEC 1.012 09/20/2018 1800   PHURINE 6.0 09/20/2018 1800   GLUCOSEU NEGATIVE 09/20/2018 1800   HGBUR NEGATIVE 09/20/2018 1800   BILIRUBINUR NEGATIVE 09/20/2018 1800   BILIRUBINUR neg 12/12/2017 1516    KETONESUR NEGATIVE 09/20/2018 1800   PROTEINUR NEGATIVE 09/20/2018 1800   UROBILINOGEN 1.0 12/12/2017 1516   UROBILINOGEN 0.2 06/02/2015 1310   NITRITE NEGATIVE 09/20/2018 1800   LEUKOCYTESUR NEGATIVE 09/20/2018 1800   Sepsis Labs: @LABRCNTIP (procalcitonin:4,lacticidven:4) ) Recent Results (from the past 240 hour(s))  Culture, Urine     Status: Abnormal   Collection Time: 09/20/18  7:00 PM  Result Value Ref Range Status   Specimen Description   Final    URINE, RANDOM Performed at [facility], [address], [place], [place] [address]    Special Requests   Final    NONE Performed at [facility], [address] [place] [address] [place], [place] [address]    Culture (A)  Final    <10,000 COLONIES/mL INSIGNIFICANT GROWTH Performed at [facility], [place] [address] [place], [place] [address]    Report Status 09/22/2018 FINAL  Final     Scheduled Meds: . aspirin EC  81 mg Oral Daily  . darifenacin  7.5 mg Oral Daily  . donepezil  10 mg Oral QHS  . ferrous sulfate  325 mg Oral Q breakfast  . insulin aspart  0-5 Units Subcutaneous QHS  . insulin aspart  0-9 Units Subcutaneous TID WC  . insulin glargine  10 Units Subcutaneous Daily  . ipratropium  0.5 mg  Nebulization BID  . levalbuterol  0.63 mg Nebulization BID  . loratadine  10 mg Oral q morning - 10a  . pantoprazole  40 mg Oral Daily  . polyethylene glycol  17 g Oral Daily  . pravastatin  40 mg Oral q1800  . senna  2 tablet Oral Daily  . sertraline  50 mg Oral Daily  . sodium chloride flush  3 mL Intravenous Q12H  . tamsulosin  0.4 mg Oral q morning - 10a  . Vitamin D (Ergocalciferol)  50,000 Units Oral Q7 days   Continuous Infusions: . sodium chloride      Procedures/Studies: Dg Chest 2 View  Result Date: 09/21/2018 CLINICAL DATA:  Weakness and confusion. EXAM: CHEST - 2 VIEW COMPARISON:  August 11, 2018 FINDINGS: Stable bibasilar changes, likely scar atelectasis. Stable cardiomegaly. No change in the mediastinum. No  pneumothorax. IMPRESSION: Chronic bibasilar atelectasis or scar.  No acute change. Electronically Signed   By: Dorise Bullion III M.D   On: 09/21/2018 20:08   Ct Head Wo Contrast  Result Date: 09/21/2018 CLINICAL DATA:  Five-day history of confusion. EXAM: CT HEAD WITHOUT CONTRAST TECHNIQUE: Contiguous axial images were obtained from the base of the skull through the vertex without intravenous contrast. COMPARISON:  Head CT 08/03/2018 FINDINGS: Brain: Stable age related cerebral atrophy, ventriculomegaly and periventricular white matter disease. No extra-axial fluid collections are identified. No CT findings for acute hemispheric infarction or intracranial hemorrhage. No mass lesions. Stable bilateral basal ganglia calcifications. The brainstem and cerebellum are normal. Vascular: No hyperdense vessel or unexpected calcification. Skull: No skull fracture or bone lesion. Stable hyperostosis frontalis interna. Sinuses/Orbits: The paranasal sinuses and mastoid air cells are grossly clear. The globes are intact. Other: No scalp lesions or hematoma. IMPRESSION: 1. Stable cerebral atrophy, ventriculomegaly and periventricular white matter disease. 2. No new/acute intracranial findings. Electronically Signed   By: Marijo Sanes M.D.   On: 09/21/2018 20:38   Ct Chest Wo Contrast  Result Date: 09/23/2018 CLINICAL DATA:  Acute respiratory failure, hypoxia EXAM: CT CHEST WITHOUT CONTRAST TECHNIQUE: Multidetector CT imaging of the chest was performed following the standard protocol without IV contrast. COMPARISON:  Chest radiographs dated 09/21/2018. CTA chest dated 08/09/2018. FINDINGS: Cardiovascular: The heart is top-normal in size. No pericardial effusion. No evidence of thoracic aortic aneurysm. Atherosclerotic calcifications of the aortic arch. Mild coronary atherosclerosis of the LAD. Mediastinum/Nodes: Small mediastinal lymph nodes, including an 11 mm short axis AP window node, likely reactive. Left thyroid  is enlarged/nodular, within aggregate 3.1 cm left thyroid nodule (series 2/image 26), unchanged from 2015. Lungs/Pleura: Evaluation of the lung parenchyma is constrained by respiratory motion. Scarring in the right middle lobe, lingula, and bilateral lower lobes, chronic. Mild patchy/ground-glass opacities in the medial left upper lobe (series 4/image 45) and lateral left upper lobe/lingula (series 4/image 59), suggesting mild infection. 5 mm triangular nodule in the right upper and middle lobes (series 4/images 71 and 82), unchanged from 2015, benign. Suspected mild centrilobular emphysematous changes. No pleural effusion or pneumothorax. Upper Abdomen: Visualized upper abdomen is grossly unremarkable. Musculoskeletal: Degenerative changes of the visualized thoracolumbar spine. Cervical spine fixation hardware, incompletely visualized. IMPRESSION: Mild patchy/ground-glass opacities in the left upper lobe/lingula, suggesting mild infection. Underlying chronic scarring in the right middle lobe, lingula, and bilateral lower lobes. Aortic Atherosclerosis (ICD10-I70.0) and Emphysema (ICD10-J43.9). Electronically Signed   By: Julian Hy M.D.   On: 09/23/2018 16:32   US Carotid Bilateral  Result Date: 09/23/2018 CLINICAL DATA:  Somnolence and encephalopathy. History  of dementia, hypertension, hyperlipidemia and diabetes. EXAM: BILATERAL CAROTID DUPLEX ULTRASOUND TECHNIQUE: Pearline Cables scale imaging, color Doppler and duplex ultrasound were performed of bilateral carotid and vertebral arteries in the neck. COMPARISON:  None. FINDINGS: Criteria: Quantification of carotid stenosis is based on velocity parameters that correlate the residual internal carotid diameter with NASCET-based stenosis levels, using the diameter of the distal internal carotid lumen as the denominator for stenosis measurement. The following velocity measurements were obtained: RIGHT ICA:  75/25 cm/sec CCA:  13/24 cm/sec SYSTOLIC ICA/CCA RATIO:  1.0  ECA:  84 cm/sec LEFT ICA:  64/21 cm/sec CCA:  40/10 cm/sec SYSTOLIC ICA/CCA RATIO:  0.7 ECA:  94 cm/sec RIGHT CAROTID ARTERY: Minimal calcified plaque in the common carotid artery. Moderate eccentric plaque in the carotid bulb. Mild calcified plaque at the right ICA origin. Velocities and waveforms are normal and estimated right ICA stenosis is less than 50%. RIGHT VERTEBRAL ARTERY: Limited ability to visualize the vertebral artery due to patient positioning. Sampling was therefore limited. There appears to be likely antegrade flow. LEFT CAROTID ARTERY: There is a mild amount of calcified plaque at the level of the left carotid bulb and proximal left ICA. Estimated left ICA stenosis is less than 50%. LEFT VERTEBRAL ARTERY: The left vertebral artery was not visualized, likely mostly due to patient positioning. Left vertebral artery occlusion cannot be excluded by the current study. IMPRESSION: 1. Plaque at the level of both carotid bulbs and proximal internal carotid arteries without significant stenosis identified. Estimated bilateral ICA stenoses are less than 50%. 2. Poor visualization of both vertebral arteries. This is felt to be mostly due to patient positioning. Left vertebral artery occlusion cannot be excluded due to lack of visualization. Electronically Signed   By: Aletta Edouard M.D.   On: 09/23/2018 13:15   Nm Pulmonary Perf And Vent  Result Date: 09/22/2018 CLINICAL DATA:  Shortness of breath, weakness, elevated D-dimer, suspected pulmonary embolism EXAM: NUCLEAR MEDICINE VENTILATION - PERFUSION LUNG SCAN TECHNIQUE: Ventilation images were obtained in multiple projections using inhaled aerosol Tc-18m DTPA. Perfusion images were obtained in multiple projections after intravenous injection of Tc-38m-MAA. RADIOPHARMACEUTICALS:  33 mCi of Tc-29m DTPA aerosol inhalation and 4.4 mCi Tc71m-MAA IV COMPARISON:  None Correlation: Chest radiograph 09/21/2018 FINDINGS: Ventilation: Central airway deposition  of aerosol. Poor peripheral distribution of aerosol to upper lobes. Diminished ventilation LEFT lower lobe medially. Perfusion: No segmental or subsegmental perfusion defects. Minimal irregularity of perfusion at the posterior RIGHT lung, nonsegmental. IMPRESSION: Very low probability for pulmonary embolism. Electronically Signed   By: Lavonia Dana M.D.   On: 09/22/2018 09:54   US Venous Img Lower Bilateral  Result Date: 09/22/2018 CLINICAL DATA:  Bilateral lower extremity pain. EXAM: BILATERAL LOWER EXTREMITY VENOUS DOPPLER ULTRASOUND TECHNIQUE: Gray-scale sonography with graded compression, as well as color Doppler and duplex ultrasound were performed to evaluate the lower extremity deep venous systems from the level of the common femoral vein and including the common femoral, femoral, profunda femoral, popliteal and calf veins including the posterior tibial, peroneal and gastrocnemius veins when visible. The superficial great saphenous vein was also interrogated. Spectral Doppler was utilized to evaluate flow at rest and with distal augmentation maneuvers in the common femoral, femoral and popliteal veins. COMPARISON:  None. FINDINGS: RIGHT LOWER EXTREMITY Common Femoral Vein: No evidence of thrombus. Normal compressibility, respiratory phasicity and response to augmentation. Saphenofemoral Junction: No evidence of thrombus. Normal compressibility and flow on color Doppler imaging. Profunda Femoral Vein: No evidence of thrombus. Normal compressibility and flow  on color Doppler imaging. Femoral Vein: No evidence of thrombus. Normal compressibility, respiratory phasicity and response to augmentation. Popliteal Vein: No evidence of thrombus. Normal compressibility, respiratory phasicity and response to augmentation. Calf Veins: No evidence of thrombus. Normal compressibility and flow on color Doppler imaging. Superficial Great Saphenous Vein: No evidence of thrombus. Normal compressibility. Venous Reflux:  None.  Other Findings:  None. LEFT LOWER EXTREMITY Common Femoral Vein: No evidence of thrombus. Normal compressibility, respiratory phasicity and response to augmentation. Saphenofemoral Junction: No evidence of thrombus. Normal compressibility and flow on color Doppler imaging. Profunda Femoral Vein: No evidence of thrombus. Normal compressibility and flow on color Doppler imaging. Femoral Vein: No evidence of thrombus. Normal compressibility, respiratory phasicity and response to augmentation. Popliteal Vein: No evidence of thrombus. Normal compressibility, respiratory phasicity and response to augmentation. Calf Veins: No evidence of thrombus. Normal compressibility and flow on color Doppler imaging. Superficial Great Saphenous Vein: No evidence of thrombus. Normal compressibility. Venous Reflux:  None. Other Findings:  None. IMPRESSION: No evidence of deep venous thrombosis. Electronically Signed   By: Marijo Conception, M.D.   On: 09/22/2018 14:27    Orson Eva, DO  [facility] Pager [phone]  If 7PM-7AM, please contact night-coverage [email or web address] Password [email or web address] 09/24/2018, 1:38 PM   LOS: 0 days

## 2018-09-24 NOTE — Progress Notes (Signed)
Patient's respiratory panel and flu are negative. Discontinued precautions.

## 2018-09-25 DIAGNOSIS — I471 Supraventricular tachycardia: Secondary | ICD-10-CM

## 2018-09-25 LAB — BASIC METABOLIC PANEL
Anion gap: 8 (ref 5–15)
BUN: 21 mg/dL (ref 8–23)
CALCIUM: 8.1 mg/dL — AB (ref 8.9–10.3)
CO2: 23 mmol/L (ref 22–32)
CREATININE: 1.83 mg/dL — AB (ref 0.61–1.24)
Chloride: 104 mmol/L (ref 98–111)
GFR calc Af Amer: 37 mL/min — ABNORMAL LOW (ref >60)
GFR, EST NON AFRICAN AMERICAN: 32 mL/min — AB (ref >60)
GLUCOSE: 148 mg/dL — AB (ref 70–99)
Potassium: 4.5 mmol/L (ref 3.5–5.1)
SODIUM: 135 mmol/L (ref 135–145)

## 2018-09-25 LAB — GLUCOSE, CAPILLARY
Glucose-Capillary: 128 mg/dL — ABNORMAL HIGH (ref 70–99)
Glucose-Capillary: 159 mg/dL — ABNORMAL HIGH (ref 70–99)
Glucose-Capillary: 157 mg/dL — ABNORMAL HIGH (ref 70–99)
Glucose-Capillary: 165 mg/dL — ABNORMAL HIGH (ref 70–99)

## 2018-09-25 LAB — MAGNESIUM: MAGNESIUM: 2.2 mg/dL (ref 1.7–2.4)

## 2018-09-25 MED ORDER — HEPARIN SODIUM (PORCINE) 5000 UNIT/ML IJ SOLN
5000.0000 [IU] | Freq: Three times a day (TID) | INTRAMUSCULAR | Status: DC
  Administered 2018-09-25 – 2018-09-26 (×3): 5000 [IU] via SUBCUTANEOUS
  Filled 2018-09-25 (×3): qty 1

## 2018-09-25 MED ORDER — METOPROLOL TARTRATE 25 MG PO TABS
25.0000 mg | ORAL_TABLET | Freq: Two times a day (BID) | ORAL | Status: DC
  Administered 2018-09-25 – 2018-09-26 (×3): 25 mg via ORAL
  Filled 2018-09-25 (×3): qty 1

## 2018-09-25 MED ORDER — SODIUM CHLORIDE 0.9 % IV SOLN
INTRAVENOUS | Status: AC
  Administered 2018-09-25: 18:00:00 via INTRAVENOUS

## 2018-09-25 NOTE — Evaluation (Signed)
Clinical/Bedside Swallow Evaluation Patient Details  Name: Samuel Ryan MRN: [id number] Date of Birth: [birth year]  Today's Date: 09/25/2018 Time: SLP Start Time (ACUTE ONLY): 1122 SLP Stop Time (ACUTE ONLY): 1150 SLP Time Calculation (min) (ACUTE ONLY): 28 min  Past Medical History:  Past Medical History:  Diagnosis Date  . Acute cholecystitis 11/2012   Drained percutaneously  . Anxiety   . BPH (benign prostatic hypertrophy)   . Deep venous thrombosis (HCC)    Left leg, diagnosed 7/12  . Dementia [facility])    Needs supervision for safety per son - POA  . Diverticulitis   . Essential hypertension, benign   . GERD (gastroesophageal reflux disease)   . History of pneumonia    Prior to 2012  . Incontinence of urine   . Mixed hyperlipidemia   . Osteoarthritis   . Pulmonary embolism ([place])    Diagnosed 7/12  . Type 2 diabetes mellitus ([place])    Past Surgical History:  Past Surgical History:  Procedure Laterality Date  . CARPAL TUNNEL RELEASE    . COLONOSCOPY  08/31/2012   Procedure: COLONOSCOPY;  Surgeon: Rogene Houston, MD;  Location: [facility]O [facility];  Service: Endoscopy;  Laterality: N/A;  830  . CYSTOSCOPY WITH INJECTION N/A 05/15/2018   Procedure: CYSTOSCOPY WITH BOTOX INJECTION;  Surgeon: Cleon Gustin, MD;  Location: [facility] ORS;  Service: Urology;  Laterality: N/A;  . CYSTOSCOPY WITH INSERTION OF UROLIFT N/A 09/02/2016   Procedure: CYSTOSCOPY WITH INSERTION OF UROLIFT;  Surgeon: Cleon Gustin, MD;  [facility]  Service: Urology;  Laterality: N/A;  . EYE SURGERY Bilateral    bilateral cataract  . Right total knee replacement  04/23/2011  . SPINE SURGERY  prior to 2012   Neck fusion  . UMBILICAL HERNIA REPAIR     umbilical   HPI:  84 year old male with a history of dementia, hypertension, hyperlipidemia, CKD stage III, type is mellitus type II, pulmonary fibrosis, and DVT July 2012 presented with 1 week history of increasing somnolence,  forgetfulness, and decreased oral intake.  Patient was recently admitted to the hospital from 08/03/2018 through 08/08/2018 when the patient was found on the floor encephalopathic.  It was thought to be due to dehydration and acute on chronic renal failure.  The patient was hydrated and subsequently discharged to skilled nursing facility.  He has since returned home.  The patient son also has noted that he has been dragging his left leg with increasing gait instability in the past week.  The patient has not had any falls since his last admission it.  At baseline, the patient is alert and oriented x2 and able to dress himself and feed himself.  He does require assistance with bathing.  The patient was taken to see his primary care provider on 09/20/2018.  Urinalysis at that time was negative.  In the emergency department, the patient was noted to be tachycardic and hypoxic with oxygen saturation 89%.  BMP showed sodium 133, serum creatinine 1.85, WBC 5.5. BSE requested.   Assessment / Plan / Recommendation Clinical Impression  Pt is a very pleasant gentleman who denies difficulty swallowing. His nurse reported mild difficulties swallowing a medication eariler today. Oral motor examination is WNL. Pt assessed at bedside in upright position with ice chips, thin water via self presented cup/straw sips, puree, and graham crackers. Pt without overt signs or symptoms of aspiration over course of trials. If MD has concerns about occult aspiration, can complete MBSS, however Pt  appears to be adequately protecting airway. He will need set up assist to ensure that he is sitting upright when eating in the bed. No further SLP services indicated at this time. SLP will sign off. Above to RN.  SLP Visit Diagnosis: Dysphagia, unspecified (R13.10)    Aspiration Risk  No limitations    Diet Recommendation Regular;Thin liquid   Liquid Administration via: Cup;Straw Medication Administration: Whole meds with  liquid Supervision: Patient able to self feed;Intermittent supervision to cue for compensatory strategies Compensations: Slow rate;Small sips/bites Postural Changes: Seated upright at 90 degrees;Remain upright for at least 30 minutes after po intake    Other  Recommendations Oral Care Recommendations: Oral care BID;Staff/trained caregiver to provide oral care Other Recommendations: Clarify dietary restrictions   Follow up Recommendations 24 hour supervision/assistance      Frequency and Duration            Prognosis Prognosis for Safe Diet Advancement: Good      Swallow Study   General Date of Onset: 09/21/18 HPI: 84 year old male with a history of dementia, hypertension, hyperlipidemia, CKD stage III, type is mellitus type II, pulmonary fibrosis, and DVT July 2012 presented with 1 week history of increasing somnolence, forgetfulness, and decreased oral intake.  Patient was recently admitted to the hospital from 08/03/2018 through 08/08/2018 when the patient was found on the floor encephalopathic.  It was thought to be due to dehydration and acute on chronic renal failure.  The patient was hydrated and subsequently discharged to skilled nursing facility.  He has since returned home.  The patient son also has noted that he has been dragging his left leg with increasing gait instability in the past week.  The patient has not had any falls since his last admission it.  At baseline, the patient is alert and oriented x2 and able to dress himself and feed himself.  He does require assistance with bathing.  The patient was taken to see his primary care provider on 09/20/2018.  Urinalysis at that time was negative.  In the emergency department, the patient was noted to be tachycardic and hypoxic with oxygen saturation 89%.  BMP showed sodium 133, serum creatinine 1.85, WBC 5.5. BSE requested. Type of Study: Bedside Swallow Evaluation Previous Swallow Assessment: BSE 09/16/2017 Diet Prior to this Study:  Regular;Thin liquids Temperature Spikes Noted: No Respiratory Status: Nasal cannula History of Recent Intubation: No Behavior/Cognition: Alert;Cooperative;Pleasant mood Oral Cavity Assessment: Within Functional Limits Oral Care Completed by SLP: Yes Oral Cavity - Dentition: Dentures, top;Dentures, bottom Vision: Functional for self-feeding Self-Feeding Abilities: Able to feed self Patient Positioning: Upright in bed Baseline Vocal Quality: Normal Volitional Cough: Strong Volitional Swallow: Able to elicit    Oral/Motor/Sensory Function Overall Oral Motor/Sensory Function: Within functional limits   Ice Chips Ice chips: Within functional limits(one brief cough) Presentation: Spoon   Thin Liquid Thin Liquid: Within functional limits Presentation: Cup;Self Fed;Straw    Nectar Thick Nectar Thick Liquid: Not tested   Honey Thick Honey Thick Liquid: Not tested   Puree Puree: Within functional limits Presentation: Spoon   Solid     Solid: Within functional limits Presentation: Self Fed     Thank you,  Genene Churn, [place]  PORTER,DABNEY 09/25/2018,12:07 PM

## 2018-09-25 NOTE — Progress Notes (Signed)
PROGRESS NOTE  Samuel Ryan WCH:[id number] DOB: [birth year] DOA: 09/21/2018 PCP: [name], MD Brief History: 84 year old male with a history of dementia, hypertension, hyperlipidemia, CKD stage III, type is mellitus type II, pulmonary fibrosis, and DVT July 2012 presented with 1 week history of increasing somnolence, forgetfulness, and decreased oral intake. Patient was recently admitted to the hospital from 08/03/2018 through 08/08/2018 when the patient was found on the floor encephalopathic. It was thought to be due to dehydration and acute on chronic renal failure. The patient was hydrated and subsequently discharged to skilled nursing facility. He has since returned home. The patient son also has noted that he has been dragging his left leg with increasing gait instability in the past week. The patient has not had any falls since his last admission it. At baseline, the patient is alert and oriented x2 and able to dress himself and feed himself. He does require assistance with bathing. The patient was taken to see his primary care provider on 09/20/2018. Urinalysis at that time was negative. In the emergency department, the patient was noted to be tachycardic and hypoxic with oxygen saturation 89%. BMP showed sodium 133, serum creatinine1.85, WBC 5.5.  Assessment/Plan: Acute metabolic encephalopathy -Likely multifactorial including hypoxia, dehydration, and medication induced from his anticholinergic medications -Repeat serum B12--456 -Check ammonia--10 -Personally reviewed EKG--sinus rhythm, right bundle branch block unchanged -Personally reviewed chest x-ray--chronic bibasilar atelectasisand increased interstitial markings unchanged -PNT--6.144 -Folic RXVQ--00.8 -67/61/9509 urinalysis negative for pyuria -CT brain neg -unable to perform MRI due to inability to lay flat -improved, back to baseline  Acute respiratory failure with hypoxia -secondary to  aspiration pneumonitis -VQ scan--very low probability -underlying ILD also contributing -09/23/18 CT chest--LUL and lingular patchy ground glass opacities -10/24 ABG--7.40/43/78/24 on 2L -remains on 2L -viral respiratory panel--neg -continue xopenex  SVT -HR 120s -start metoprolol tartrate -Echo EF 60-65%, Grade 1 DD, no WMA, ventricular dyssnergy -consult cardiology  Aspiration pneumonitis -started IV clinda  Dysphagia -concerned about aspiration pneumonitis -speech therapy eval-->regular diet with thin liquids  Dehydration -po intake remains poor -started IVF  Hyponatremia -Secondary to volume depletion -Continue IV fluids-->improving  Coagulopathy -PTT 144 at time of admission -likely spurious as pt was started on IV heparin @0059   CKD stage III -Baseline creatinine 1.5-1.8 -A.m. BMP  Diabetes mellitus type 2,uncontrolled with hyperglycemia -08/13/2018 hemoglobin A1c 8.0 -Continue Lantus 10 units daily -NovoLog sliding scale  Hyperlipidemia -Continue statin -am lipid panel  Dementia without behavioral disturbance -Patient is at risk for hospital delirium -Continue Aricept  Elevated D-dimer -venous duplex lower extremities--neg    Disposition Plan: SNF 10/29 if stable Family Communication:son updated at bedside    Consultants:none  Code Status: FULL   DVT Prophylaxis:IVHeparin   Subjective: Patient denies fevers, chills, headache, chest pain, dyspnea, nausea, vomiting, diarrhea, abdominal pain, dysuria, hematuria, hematochezia, and melena.   Objective: Vitals:   09/25/18 0729 09/25/18 0931 09/25/18 1018 09/25/18 1414  BP:  94/63 107/70   Pulse:  (!) 121 (!) 117 100  Resp:  (!) 24 20   Temp:  98.7 F (37.1 C)    TempSrc:  Oral    SpO2: 94% 100% 95%   Weight:        Intake/Output Summary (Last 24 hours) at 09/25/2018 1722 Last data filed at 09/25/2018 0300 Gross per 24 hour  Intake 61.68 ml  Output  -  Net 61.68 ml   Weight change: -1.2 kg Exam:   General:  Pt is alert, follows commands appropriately, not in acute distress  HEENT: No icterus, No thrush, No neck mass, [place]/AT  Cardiovascular: RRR, S1/S2, no rubs, no gallops  Respiratory: bibasilar crackles, no wheeze  Abdomen: Soft/+BS, non tender, non distended, no guarding  Extremities: trace LE edema, No lymphangitis, No petechiae, No rashes, no synovitis   Data Reviewed: I have personally reviewed following labs and imaging studies Basic Metabolic Panel: Recent Labs  Lab 09/21/18 1914 09/22/18 0643 09/23/18 0608 09/24/18 0603 09/25/18 0605  NA 133* 138 137 135 135  K 4.8 4.4 4.6 4.7 4.5  CL 103 103 101 104 104  CO2 24 28 28 24 23   GLUCOSE 300* 116* 161* 154* 148*  BUN 21 19 20 20 21   CREATININE 1.85* 1.77* 1.89* 1.74* 1.83*  CALCIUM 8.1* 8.4* 8.3* 8.0* 8.1*  MG  --   --   --  2.3 2.2   Liver Function Tests: Recent Labs  Lab 09/21/18 1914 09/22/18 0643  AST 16 17  ALT 13 14  ALKPHOS 65 64  BILITOT 0.6 0.8  PROT 7.2 7.6  ALBUMIN 2.8* 2.9*   No results for input(s): LIPASE, AMYLASE in the last 168 hours. Recent Labs  Lab 09/22/18 0953  AMMONIA 10   Coagulation Profile: Recent Labs  Lab 09/22/18 0108 09/22/18 0953  INR 1.16 1.09   CBC: Recent Labs  Lab 09/21/18 1914 09/22/18 0643  WBC 5.5 5.2  NEUTROABS 3.1  --   HGB 8.7* 10.6*  HCT 27.8* 34.3*  MCV 97.5 95.0  PLT 264 246   Cardiac Enzymes: Recent Labs  Lab 09/21/18 1914 09/22/18 0021 09/22/18 0643 09/22/18 0953 09/22/18 1217  CKTOTAL  --   --   --  61  --   TROPONINI <0.03 <0.03 <0.03  --  <0.03   BNP: Invalid input(s): POCBNP CBG: Recent Labs  Lab 09/24/18 1611 09/24/18 2104 09/25/18 0741 09/25/18 1130 09/25/18 1632  GLUCAP 147* 161* 128* 159* 157*   HbA1C: No results for input(s): HGBA1C in the last 72 hours. Urine analysis:    Component Value Date/Time   COLORURINE YELLOW 09/20/2018 1800   APPEARANCEUR  CLEAR 09/20/2018 1800   LABSPEC 1.012 09/20/2018 1800   PHURINE 6.0 09/20/2018 1800   GLUCOSEU NEGATIVE 09/20/2018 1800   HGBUR NEGATIVE 09/20/2018 1800   BILIRUBINUR NEGATIVE 09/20/2018 1800   BILIRUBINUR neg 12/12/2017 1516   KETONESUR NEGATIVE 09/20/2018 1800   PROTEINUR NEGATIVE 09/20/2018 1800   UROBILINOGEN 1.0 12/12/2017 1516   UROBILINOGEN 0.2 06/02/2015 1310   NITRITE NEGATIVE 09/20/2018 1800   LEUKOCYTESUR NEGATIVE 09/20/2018 1800   Sepsis Labs: @LABRCNTIP (procalcitonin:4,lacticidven:4) ) Recent Results (from the past 240 hour(s))  Culture, Urine     Status: Abnormal   Collection Time: 09/20/18  7:00 PM  Result Value Ref Range Status   Specimen Description   Final    URINE, RANDOM Performed at [facility], [address] [place], [place] [address]    Special Requests   Final    NONE Performed at [facility], [address] [place], [place] [address]    Culture (A)  Final    <10,000 COLONIES/mL INSIGNIFICANT GROWTH Performed at [facility], [place] [address], [place], [place] [address]    Report Status 09/22/2018 FINAL  Final  Respiratory Panel by PCR     Status: None   Collection Time: 09/23/18 11:50 AM  Result Value Ref Range Status   Adenovirus NOT DETECTED NOT DETECTED Final   Coronavirus 229E NOT DETECTED NOT  DETECTED Final   Coronavirus HKU1 NOT DETECTED NOT DETECTED Final   Coronavirus NL63 NOT DETECTED NOT DETECTED Final   Coronavirus OC43 NOT DETECTED NOT DETECTED Final   Metapneumovirus NOT DETECTED NOT DETECTED Final   Rhinovirus / Enterovirus NOT DETECTED NOT DETECTED Final   Influenza A NOT DETECTED NOT DETECTED Final   Influenza B NOT DETECTED NOT DETECTED Final   Parainfluenza Virus 1 NOT DETECTED NOT DETECTED Final   Parainfluenza Virus 2 NOT DETECTED NOT DETECTED Final   Parainfluenza Virus 3 NOT DETECTED NOT DETECTED Final   Parainfluenza Virus 4 NOT DETECTED NOT DETECTED Final   Respiratory Syncytial Virus NOT DETECTED NOT DETECTED  Final   Bordetella pertussis NOT DETECTED NOT DETECTED Final   Chlamydophila pneumoniae NOT DETECTED NOT DETECTED Final   Mycoplasma pneumoniae NOT DETECTED NOT DETECTED Final    Comment: Performed at [facility], [place] [address] [place], [place] [address]     Scheduled Meds: . aspirin EC  81 mg Oral Daily  . darifenacin  7.5 mg Oral Daily  . donepezil  10 mg Oral QHS  . ferrous sulfate  325 mg Oral Q breakfast  . insulin aspart  0-5 Units Subcutaneous QHS  . insulin aspart  0-9 Units Subcutaneous TID WC  . insulin glargine  10 Units Subcutaneous Daily  . ipratropium  0.5 mg Nebulization BID  . levalbuterol  0.63 mg Nebulization BID  . loratadine  10 mg Oral q morning - 10a  . metoprolol tartrate  25 mg Oral BID  . pantoprazole  40 mg Oral Daily  . polyethylene glycol  17 g Oral Daily  . pravastatin  40 mg Oral q1800  . senna  2 tablet Oral Daily  . sertraline  50 mg Oral Daily  . sodium chloride flush  3 mL Intravenous Q12H  . tamsulosin  0.4 mg Oral q morning - 10a  . Vitamin D (Ergocalciferol)  50,000 Units Oral Q7 days   Continuous Infusions: . sodium chloride    . clindamycin (CLEOCIN) IV 600 mg (09/25/18 1248)    Procedures/Studies: Dg Chest 2 View  Result Date: 09/21/2018 CLINICAL DATA:  Weakness and confusion. EXAM: CHEST - 2 VIEW COMPARISON:  August 11, 2018 FINDINGS: Stable bibasilar changes, likely scar atelectasis. Stable cardiomegaly. No change in the mediastinum. No pneumothorax. IMPRESSION: Chronic bibasilar atelectasis or scar.  No acute change. Electronically Signed   By: Dorise Bullion III M.D   On: 09/21/2018 20:08   Ct Head Wo Contrast  Result Date: 09/21/2018 CLINICAL DATA:  Five-day history of confusion. EXAM: CT HEAD WITHOUT CONTRAST TECHNIQUE: Contiguous axial images were obtained from the base of the skull through the vertex without intravenous contrast. COMPARISON:  Head CT 08/03/2018 FINDINGS: Brain: Stable age related cerebral atrophy,  ventriculomegaly and periventricular white matter disease. No extra-axial fluid collections are identified. No CT findings for acute hemispheric infarction or intracranial hemorrhage. No mass lesions. Stable bilateral basal ganglia calcifications. The brainstem and cerebellum are normal. Vascular: No hyperdense vessel or unexpected calcification. Skull: No skull fracture or bone lesion. Stable hyperostosis frontalis interna. Sinuses/Orbits: The paranasal sinuses and mastoid air cells are grossly clear. The globes are intact. Other: No scalp lesions or hematoma. IMPRESSION: 1. Stable cerebral atrophy, ventriculomegaly and periventricular white matter disease. 2. No new/acute intracranial findings. Electronically Signed   By: Marijo Sanes M.D.   On: 09/21/2018 20:38   Ct Chest Wo Contrast  Result Date: 09/23/2018 CLINICAL DATA:  Acute respiratory failure, hypoxia EXAM:  CT CHEST WITHOUT CONTRAST TECHNIQUE: Multidetector CT imaging of the chest was performed following the standard protocol without IV contrast. COMPARISON:  Chest radiographs dated 09/21/2018. CTA chest dated 08/09/2018. FINDINGS: Cardiovascular: The heart is top-normal in size. No pericardial effusion. No evidence of thoracic aortic aneurysm. Atherosclerotic calcifications of the aortic arch. Mild coronary atherosclerosis of the LAD. Mediastinum/Nodes: Small mediastinal lymph nodes, including an 11 mm short axis AP window node, likely reactive. Left thyroid is enlarged/nodular, within aggregate 3.1 cm left thyroid nodule (series 2/image 26), unchanged from 2015. Lungs/Pleura: Evaluation of the lung parenchyma is constrained by respiratory motion. Scarring in the right middle lobe, lingula, and bilateral lower lobes, chronic. Mild patchy/ground-glass opacities in the medial left upper lobe (series 4/image 45) and lateral left upper lobe/lingula (series 4/image 59), suggesting mild infection. 5 mm triangular nodule in the right upper and middle lobes  (series 4/images 71 and 82), unchanged from 2015, benign. Suspected mild centrilobular emphysematous changes. No pleural effusion or pneumothorax. Upper Abdomen: Visualized upper abdomen is grossly unremarkable. Musculoskeletal: Degenerative changes of the visualized thoracolumbar spine. Cervical spine fixation hardware, incompletely visualized. IMPRESSION: Mild patchy/ground-glass opacities in the left upper lobe/lingula, suggesting mild infection. Underlying chronic scarring in the right middle lobe, lingula, and bilateral lower lobes. Aortic Atherosclerosis (ICD10-I70.0) and Emphysema (ICD10-J43.9). Electronically Signed   By: Julian Hy M.D.   On: 09/23/2018 16:32   US Carotid Bilateral  Result Date: 09/23/2018 CLINICAL DATA:  Somnolence and encephalopathy. History of dementia, hypertension, hyperlipidemia and diabetes. EXAM: BILATERAL CAROTID DUPLEX ULTRASOUND TECHNIQUE: Pearline Cables scale imaging, color Doppler and duplex ultrasound were performed of bilateral carotid and vertebral arteries in the neck. COMPARISON:  None. FINDINGS: Criteria: Quantification of carotid stenosis is based on velocity parameters that correlate the residual internal carotid diameter with NASCET-based stenosis levels, using the diameter of the distal internal carotid lumen as the denominator for stenosis measurement. The following velocity measurements were obtained: RIGHT ICA:  75/25 cm/sec CCA:  35/36 cm/sec SYSTOLIC ICA/CCA RATIO:  1.0 ECA:  84 cm/sec LEFT ICA:  64/21 cm/sec CCA:  14/43 cm/sec SYSTOLIC ICA/CCA RATIO:  0.7 ECA:  94 cm/sec RIGHT CAROTID ARTERY: Minimal calcified plaque in the common carotid artery. Moderate eccentric plaque in the carotid bulb. Mild calcified plaque at the right ICA origin. Velocities and waveforms are normal and estimated right ICA stenosis is less than 50%. RIGHT VERTEBRAL ARTERY: Limited ability to visualize the vertebral artery due to patient positioning. Sampling was therefore limited.  There appears to be likely antegrade flow. LEFT CAROTID ARTERY: There is a mild amount of calcified plaque at the level of the left carotid bulb and proximal left ICA. Estimated left ICA stenosis is less than 50%. LEFT VERTEBRAL ARTERY: The left vertebral artery was not visualized, likely mostly due to patient positioning. Left vertebral artery occlusion cannot be excluded by the current study. IMPRESSION: 1. Plaque at the level of both carotid bulbs and proximal internal carotid arteries without significant stenosis identified. Estimated bilateral ICA stenoses are less than 50%. 2. Poor visualization of both vertebral arteries. This is felt to be mostly due to patient positioning. Left vertebral artery occlusion cannot be excluded due to lack of visualization. Electronically Signed   By: Aletta Edouard M.D.   On: 09/23/2018 13:15   Nm Pulmonary Perf And Vent  Result Date: 09/22/2018 CLINICAL DATA:  Shortness of breath, weakness, elevated D-dimer, suspected pulmonary embolism EXAM: NUCLEAR MEDICINE VENTILATION - PERFUSION LUNG SCAN TECHNIQUE: Ventilation images were obtained in multiple projections using inhaled  aerosol Tc-71m DTPA. Perfusion images were obtained in multiple projections after intravenous injection of Tc-59m-MAA. RADIOPHARMACEUTICALS:  33 mCi of Tc-48m DTPA aerosol inhalation and 4.4 mCi Tc20m-MAA IV COMPARISON:  None Correlation: Chest radiograph 09/21/2018 FINDINGS: Ventilation: Central airway deposition of aerosol. Poor peripheral distribution of aerosol to upper lobes. Diminished ventilation LEFT lower lobe medially. Perfusion: No segmental or subsegmental perfusion defects. Minimal irregularity of perfusion at the posterior RIGHT lung, nonsegmental. IMPRESSION: Very low probability for pulmonary embolism. Electronically Signed   By: Lavonia Dana M.D.   On: 09/22/2018 09:54   US Venous Img Lower Bilateral  Result Date: 09/22/2018 CLINICAL DATA:  Bilateral lower extremity pain. EXAM:  BILATERAL LOWER EXTREMITY VENOUS DOPPLER ULTRASOUND TECHNIQUE: Gray-scale sonography with graded compression, as well as color Doppler and duplex ultrasound were performed to evaluate the lower extremity deep venous systems from the level of the common femoral vein and including the common femoral, femoral, profunda femoral, popliteal and calf veins including the posterior tibial, peroneal and gastrocnemius veins when visible. The superficial great saphenous vein was also interrogated. Spectral Doppler was utilized to evaluate flow at rest and with distal augmentation maneuvers in the common femoral, femoral and popliteal veins. COMPARISON:  None. FINDINGS: RIGHT LOWER EXTREMITY Common Femoral Vein: No evidence of thrombus. Normal compressibility, respiratory phasicity and response to augmentation. Saphenofemoral Junction: No evidence of thrombus. Normal compressibility and flow on color Doppler imaging. Profunda Femoral Vein: No evidence of thrombus. Normal compressibility and flow on color Doppler imaging. Femoral Vein: No evidence of thrombus. Normal compressibility, respiratory phasicity and response to augmentation. Popliteal Vein: No evidence of thrombus. Normal compressibility, respiratory phasicity and response to augmentation. Calf Veins: No evidence of thrombus. Normal compressibility and flow on color Doppler imaging. Superficial Great Saphenous Vein: No evidence of thrombus. Normal compressibility. Venous Reflux:  None. Other Findings:  None. LEFT LOWER EXTREMITY Common Femoral Vein: No evidence of thrombus. Normal compressibility, respiratory phasicity and response to augmentation. Saphenofemoral Junction: No evidence of thrombus. Normal compressibility and flow on color Doppler imaging. Profunda Femoral Vein: No evidence of thrombus. Normal compressibility and flow on color Doppler imaging. Femoral Vein: No evidence of thrombus. Normal compressibility, respiratory phasicity and response to  augmentation. Popliteal Vein: No evidence of thrombus. Normal compressibility, respiratory phasicity and response to augmentation. Calf Veins: No evidence of thrombus. Normal compressibility and flow on color Doppler imaging. Superficial Great Saphenous Vein: No evidence of thrombus. Normal compressibility. Venous Reflux:  None. Other Findings:  None. IMPRESSION: No evidence of deep venous thrombosis. Electronically Signed   By: Marijo Conception, M.D.   On: 09/22/2018 14:27    Orson Eva, DO  [facility] Pager [phone]  If 7PM-7AM, please contact night-coverage [email or web address] Password [email or web address] 09/25/2018, 5:22 PM   LOS: 1 day

## 2018-09-25 NOTE — Progress Notes (Signed)
**Note De-Identified  Obfuscation** EKH completed; RN notified and placed in patient chart.

## 2018-09-25 NOTE — Clinical Social Work Note (Signed)
Received call from [facility] at [facility] stating pt was approved by [facility].  Confirmed with her that plan is for d/c today.

## 2018-09-26 ENCOUNTER — Inpatient Hospital Stay
Admission: RE | Admit: 2018-09-26 | Discharge: 2018-10-07 | Disposition: A | Discharge status: Home / Self Care | Payer: Medicare HMO | Source: Ambulatory Visit | Attending: Internal Medicine | Admitting: Internal Medicine

## 2018-09-26 ENCOUNTER — Telehealth: Payer: Self-pay | Admitting: Family Medicine

## 2018-09-26 ENCOUNTER — Non-Acute Institutional Stay (SKILLED_NURSING_FACILITY): Payer: Medicare HMO | Admitting: Internal Medicine

## 2018-09-26 ENCOUNTER — Encounter: Payer: Self-pay | Admitting: Internal Medicine

## 2018-09-26 DIAGNOSIS — R0902 Hypoxemia: Secondary | ICD-10-CM | POA: Diagnosis not present

## 2018-09-26 DIAGNOSIS — D649 Anemia, unspecified: Secondary | ICD-10-CM

## 2018-09-26 DIAGNOSIS — M6281 Muscle weakness (generalized): Secondary | ICD-10-CM | POA: Diagnosis not present

## 2018-09-26 DIAGNOSIS — F329 Major depressive disorder, single episode, unspecified: Secondary | ICD-10-CM | POA: Diagnosis not present

## 2018-09-26 DIAGNOSIS — E1165 Type 2 diabetes mellitus with hyperglycemia: Secondary | ICD-10-CM | POA: Diagnosis not present

## 2018-09-26 DIAGNOSIS — F0280 Dementia in other diseases classified elsewhere without behavioral disturbance: Secondary | ICD-10-CM | POA: Diagnosis not present

## 2018-09-26 DIAGNOSIS — G309 Alzheimer's disease, unspecified: Secondary | ICD-10-CM

## 2018-09-26 DIAGNOSIS — Z794 Long term (current) use of insulin: Secondary | ICD-10-CM | POA: Diagnosis not present

## 2018-09-26 DIAGNOSIS — I471 Supraventricular tachycardia: Secondary | ICD-10-CM

## 2018-09-26 DIAGNOSIS — G9341 Metabolic encephalopathy: Secondary | ICD-10-CM | POA: Diagnosis not present

## 2018-09-26 DIAGNOSIS — J9601 Acute respiratory failure with hypoxia: Secondary | ICD-10-CM | POA: Diagnosis not present

## 2018-09-26 DIAGNOSIS — N183 Chronic kidney disease, stage 3 unspecified: Secondary | ICD-10-CM

## 2018-09-26 DIAGNOSIS — G308 Other Alzheimer's disease: Secondary | ICD-10-CM | POA: Diagnosis not present

## 2018-09-26 DIAGNOSIS — G300 Alzheimer's disease with early onset: Secondary | ICD-10-CM | POA: Diagnosis not present

## 2018-09-26 DIAGNOSIS — J698 Pneumonitis due to inhalation of other solids and liquids: Secondary | ICD-10-CM | POA: Diagnosis not present

## 2018-09-26 DIAGNOSIS — I129 Hypertensive chronic kidney disease with stage 1 through stage 4 chronic kidney disease, or unspecified chronic kidney disease: Secondary | ICD-10-CM | POA: Diagnosis not present

## 2018-09-26 DIAGNOSIS — E1129 Type 2 diabetes mellitus with other diabetic kidney complication: Secondary | ICD-10-CM | POA: Diagnosis not present

## 2018-09-26 DIAGNOSIS — E119 Type 2 diabetes mellitus without complications: Secondary | ICD-10-CM | POA: Diagnosis not present

## 2018-09-26 DIAGNOSIS — J690 Pneumonitis due to inhalation of food and vomit: Secondary | ICD-10-CM | POA: Diagnosis not present

## 2018-09-26 DIAGNOSIS — E1122 Type 2 diabetes mellitus with diabetic chronic kidney disease: Secondary | ICD-10-CM | POA: Diagnosis not present

## 2018-09-26 DIAGNOSIS — J8410 Pulmonary fibrosis, unspecified: Secondary | ICD-10-CM | POA: Diagnosis not present

## 2018-09-26 DIAGNOSIS — I10 Essential (primary) hypertension: Secondary | ICD-10-CM | POA: Diagnosis not present

## 2018-09-26 DIAGNOSIS — E559 Vitamin D deficiency, unspecified: Secondary | ICD-10-CM | POA: Diagnosis not present

## 2018-09-26 LAB — BASIC METABOLIC PANEL
ANION GAP: 7 (ref 5–15)
BUN: 24 mg/dL — ABNORMAL HIGH (ref 8–23)
CO2: 24 mmol/L (ref 22–32)
Calcium: 8 mg/dL — ABNORMAL LOW (ref 8.9–10.3)
Chloride: 105 mmol/L (ref 98–111)
Creatinine, Ser: 1.8 mg/dL — ABNORMAL HIGH (ref 0.61–1.24)
GFR, EST AFRICAN AMERICAN: 38 mL/min — AB (ref >60)
GFR, EST NON AFRICAN AMERICAN: 33 mL/min — AB (ref >60)
Glucose, Bld: 133 mg/dL — ABNORMAL HIGH (ref 70–99)
POTASSIUM: 4.8 mmol/L (ref 3.5–5.1)
SODIUM: 136 mmol/L (ref 135–145)

## 2018-09-26 LAB — GLUCOSE, CAPILLARY
GLUCOSE-CAPILLARY: 112 mg/dL — AB (ref 70–99)
GLUCOSE-CAPILLARY: 163 mg/dL — AB (ref 70–99)

## 2018-09-26 MED ORDER — TRAMADOL HCL 50 MG PO TABS: 50.0000 mg | ORAL_TABLET | Freq: Two times a day (BID) | ORAL | 0 refills | Status: DC | PRN

## 2018-09-26 MED ORDER — CLINDAMYCIN HCL 300 MG PO CAPS: 300.0000 mg | ORAL_CAPSULE | Freq: Four times a day (QID) | ORAL | 0 refills | Status: DC

## 2018-09-26 MED ORDER — METOPROLOL TARTRATE 25 MG PO TABS: 25.0000 mg | ORAL_TABLET | Freq: Two times a day (BID) | ORAL | 1 refills | Status: DC

## 2018-09-26 NOTE — Clinical Social Work Placement (Signed)
   CLINICAL SOCIAL WORK PLACEMENT  NOTE  Date:  09/26/2018  Patient Details  Name: Samuel Ryan MRN: [id number] Date of Birth: November 21, 1931  Clinical Social Work is seeking post-discharge placement for this patient at the [place] level of care (*CSW will initial, date and re-position this form in  chart as items are completed):  Yes   Patient/family provided with [facility] Work Department's list of facilities offering this level of care within the geographic area requested by the patient (or if unable, by the patient's family).  Yes   Patient/family informed of their freedom to choose among providers that offer the needed level of care, that participate in Medicare, Medicaid or managed care program needed by the patient, have an available bed and are willing to accept the patient.  Yes   Patient/family informed of [facility]'s ownership interest in [facility] and [facility], as well as of the fact that they are under no obligation to receive care at these facilities.  PASRR submitted to EDS on       PASRR number received on       Existing PASRR number confirmed on 09/25/18     FL2 transmitted to all facilities in geographic area requested by pt/family on 09/25/18     FL2 transmitted to all facilities within larger geographic area on       Patient informed that his/her managed care company has contracts with or will negotiate with certain facilities, including the following:        Yes   Patient/family informed of bed offers received.  Patient chooses bed at [facility]     Physician recommends and patient chooses bed at      Patient to be transferred to [facility] on 09/26/18.  Patient to be transferred to facility by [facility] staff     Patient family notified on 09/26/18 of transfer.  Name of family member notified:  Samuel Ryan, son     PHYSICIAN       Additional Comment:  Facility notified and discharge  clinicals sent. LCSW signing off.   _______________________________________________ Ihor Gully, LCSW 09/26/2018, 3:09 PM

## 2018-09-26 NOTE — Telephone Encounter (Signed)
Pt son is calling regarding Refills from [employer]--[employer] states they have not received anything from [place]

## 2018-09-26 NOTE — Progress Notes (Signed)
IV removed, WNL. D/C instructions given to pt and son at bedside, verbalized understanding. Report called and given to [facility] at [facility]. Nursing staff to transport patient to the [facility]
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
## 2018-09-26 NOTE — Consult Note (Signed)
Cardiology Consultation:   Patient ID: [name] MRN: [id number]; DOB: 06-10-1931  Admit date: 09/21/2018 Date of Consult: 09/26/2018  Primary Care Provider: [name], MD Primary Cardiologist: Dr Johnny Bridge Primary Electrophysiologist:  na   Patient Profile:   Samuel Ryan is a 84 y.o. male with a hx of dementia, HTN, HL, DM2  who is being seen today for the evaluation of tachycardia at the request of Dr Tat.   History of Present Illness:   Mr. Samuel Ryan 84 yo male history of dementia, HTN, HL, DM2, chronic diasotlic HF and LE edema, CKD 3 admitted with AMS. In ER hypoxic to 77%, being managed for aspiration pneumonitis by primary team. Cardiology consulted for episodes of SVT yesterday.     Admission labs WBC 5.5 Hgb 8.7 K 4.8 Cr 1.85 BUN 21 BNP 120 D-dimer 13 TSH 0.441  ABG 7.4/43/78/26 Trop neg x 3 EKG SR, RBBB CXR no acute process CT head no acute process  VQ very low probability PE LE venous US: no DVT CT chest patch ground glass LUL 08/2018 echo LVEF 26-94%, grade I diastolic dysfunction Past Medical History:  Diagnosis Date  . Acute cholecystitis 11/2012   Drained percutaneously  . Anxiety   . BPH (benign prostatic hypertrophy)   . Deep venous thrombosis (HCC)    Left leg, diagnosed 7/12  . Dementia [facility])    Needs supervision for safety per son - POA  . Diverticulitis   . Essential hypertension, benign   . GERD (gastroesophageal reflux disease)   . History of pneumonia    Prior to 2012  . Incontinence of urine   . Mixed hyperlipidemia   . Osteoarthritis   . Pulmonary embolism ([place])    Diagnosed 7/12  . Type 2 diabetes mellitus ([place])     Past Surgical History:  Procedure Laterality Date  . CARPAL TUNNEL RELEASE    . COLONOSCOPY  08/31/2012   Procedure: COLONOSCOPY;  Surgeon: Rogene Houston, MD;  Location: [facility];  Service: Endoscopy;  Laterality: N/A;  830  . CYSTOSCOPY WITH INJECTION N/A 05/15/2018   Procedure: CYSTOSCOPY WITH  BOTOX INJECTION;  Surgeon: Cleon Gustin, MD;  Location: [facility] ORS;  Service: Urology;  Laterality: N/A;  . CYSTOSCOPY WITH INSERTION OF UROLIFT N/A 09/02/2016   Procedure: CYSTOSCOPY WITH INSERTION OF UROLIFT;  Surgeon: Cleon Gustin, MD;  Location: [facility];  Service: Urology;  Laterality: N/A;  . EYE SURGERY Bilateral    bilateral cataract  . Right total knee replacement  04/23/2011  . SPINE SURGERY  prior to 2012   Neck fusion  . UMBILICAL HERNIA REPAIR     umbilical       Inpatient Medications: Scheduled Meds: . aspirin EC  81 mg Oral Daily  . darifenacin  7.5 mg Oral Daily  . donepezil  10 mg Oral QHS  . ferrous sulfate  325 mg Oral Q breakfast  . heparin injection (subcutaneous)  5,000 Units Subcutaneous Q8H  . insulin aspart  0-5 Units Subcutaneous QHS  . insulin aspart  0-9 Units Subcutaneous TID WC  . insulin glargine  10 Units Subcutaneous Daily  . ipratropium  0.5 mg Nebulization BID  . levalbuterol  0.63 mg Nebulization BID  . loratadine  10 mg Oral q morning - 10a  . metoprolol tartrate  25 mg Oral BID  . pantoprazole  40 mg Oral Daily  . polyethylene glycol  17 g Oral Daily  . pravastatin  40 mg  Oral q1800  . senna  2 tablet Oral Daily  . sertraline  50 mg Oral Daily  . sodium chloride flush  3 mL Intravenous Q12H  . tamsulosin  0.4 mg Oral q morning - 10a  . Vitamin D (Ergocalciferol)  50,000 Units Oral Q7 days   Continuous Infusions: . sodium chloride    . clindamycin (CLEOCIN) IV 600 mg (09/26/18 0507)   PRN Meds: sodium chloride, acetaminophen **OR** acetaminophen, ondansetron (ZOFRAN) IV, polyvinyl alcohol, sodium chloride flush  Allergies:    Allergies  Allergen Reactions  . Zosyn [Piperacillin Sod-Tazobactam So] Anaphylaxis, Swelling and Other (See Comments)    Facial swelling Has patient had a PCN reaction causing immediate rash, facial/tongue/throat swelling, SOB or lightheadedness with hypotension: Yes Has patient had  a PCN reaction causing severe rash involving mucus membranes or skin necrosis: No Has patient had a PCN reaction that required hospitalization: No Has patient had a PCN reaction occurring within the last 10 years: Yes If all of the above answers are "NO", then may proceed with Cephalosporin use.   . Ace Inhibitors Cough  . Vancomycin Swelling and Other (See Comments)    Facial swelling    Social History:   Social History   Socioeconomic History  . Marital status: Widowed    Spouse name: Not on file  . Number of children: Not on file  . Years of education: Not on file  . Highest education level: Not on file  Occupational History  . Not on file  Social Needs  . Financial resource strain: Not on file  . Food insecurity:    Worry: Not on file    Inability: Not on file  . Transportation needs:    Medical: Not on file    Non-medical: Not on file  Tobacco Use  . Smoking status: Never Smoker  . Smokeless tobacco: Never Used  Substance and Sexual Activity  . Alcohol use: No  . Drug use: No  . Sexual activity: Never  Lifestyle  . Physical activity:    Days per week: Not on file    Minutes per session: Not on file  . Stress: Not on file  Relationships  . Social connections:    Talks on phone: Not on file    Gets together: Not on file    Attends religious service: Not on file    Active member of club or organization: Not on file    Attends meetings of clubs or organizations: Not on file    Relationship status: Not on file  . Intimate partner violence:    Fear of current or ex partner: Not on file    Emotionally abused: Not on file    Physically abused: Not on file    Forced sexual activity: Not on file  Other Topics Concern  . Not on file  Social History Narrative  . Not on file    Family History:    Family History  Problem Relation Age of Onset  . Cancer Mother        Stomach  . Diabetes Sister   . Hypertension Sister   . Diabetes Brother   . Cancer Brother          Gallbladder  . Seizures Son        due to meningitis as a child     ROS:  Please see the history of present illness.   All other ROS reviewed and negative.     Physical Exam/Data:   Vitals:  09/26/18 0500 09/26/18 0602 09/26/18 0805 09/26/18 0814  BP:  114/76 121/79   Pulse:  (!) 101 100   Resp:  17    Temp:  98.2 F (36.8 C)    TempSrc:  Oral    SpO2:  96% 97% 96%  Weight: 113.9 kg       Intake/Output Summary (Last 24 hours) at 09/26/2018 0934 Last data filed at 09/26/2018 0606 Gross per 24 hour  Intake 1176.65 ml  Output 1600 ml  Net -423.35 ml   Filed Weights   09/24/18 0655 09/25/18 0500 09/26/18 0500  Weight: 114.8 kg 113.6 kg 113.9 kg   Body mass index is 36.03 kg/m.  General:  Well nourished, well developed, in no acute distress HEENT: normal Lymph: no adenopathy Neck: no JVD Endocrine:  No thryomegaly Cardiac:  normal S1, S2; RRR; no murmur Lungs:  Mild bilaterla wheezing Abd: soft, nontender, no hepatomegaly  Ext: no edema Musculoskeletal:  No deformities, BUE and BLE strength normal and equal Skin: warm and dry  Neuro:  CNs 2-12 intact, no focal abnormalities noted Psych:  Normal affect     Laboratory Data:  Chemistry Recent Labs  Lab 09/24/18 0603 09/25/18 0605 09/26/18 0517  NA 135 135 136  K 4.7 4.5 4.8  CL 104 104 105  CO2 24 23 24   GLUCOSE 154* 148* 133*  BUN 20 21 24*  CREATININE 1.74* 1.83* 1.80*  CALCIUM 8.0* 8.1* 8.0*  GFRNONAA 34* 32* 33*  GFRAA 39* 37* 38*  ANIONGAP 7 8 7     Recent Labs  Lab 09/21/18 1914 09/22/18 0643  PROT 7.2 7.6  ALBUMIN 2.8* 2.9*  AST 16 17  ALT 13 14  ALKPHOS 65 64  BILITOT 0.6 0.8   Hematology Recent Labs  Lab 09/21/18 1914 09/22/18 0643  WBC 5.5 5.2  RBC 2.85* 3.61*  HGB 8.7* 10.6*  HCT 27.8* 34.3*  MCV 97.5 95.0  MCH 30.5 29.4  MCHC 31.3 30.9  RDW 13.6 13.5  PLT 264 246   Cardiac Enzymes Recent Labs  Lab 09/21/18 1914 09/22/18 0021 09/22/18 0643 09/22/18 1217   TROPONINI <0.03 <0.03 <0.03 <0.03   No results for input(s): TROPIPOC in the last 168 hours.  BNP Recent Labs  Lab 09/21/18 1914  BNP 120.0*    DDimer  Recent Labs  Lab 09/21/18 1914  DDIMER 13.48*    Radiology/Studies:  Ct Chest Wo Contrast  Result Date: 09/23/2018 CLINICAL DATA:  Acute respiratory failure, hypoxia EXAM: CT CHEST WITHOUT CONTRAST TECHNIQUE: Multidetector CT imaging of the chest was performed following the standard protocol without IV contrast. COMPARISON:  Chest radiographs dated 09/21/2018. CTA chest dated 08/09/2018. FINDINGS: Cardiovascular: The heart is top-normal in size. No pericardial effusion. No evidence of thoracic aortic aneurysm. Atherosclerotic calcifications of the aortic arch. Mild coronary atherosclerosis of the LAD. Mediastinum/Nodes: Small mediastinal lymph nodes, including an 11 mm short axis AP window node, likely reactive. Left thyroid is enlarged/nodular, within aggregate 3.1 cm left thyroid nodule (series 2/image 26), unchanged from 2015. Lungs/Pleura: Evaluation of the lung parenchyma is constrained by respiratory motion. Scarring in the right middle lobe, lingula, and bilateral lower lobes, chronic. Mild patchy/ground-glass opacities in the medial left upper lobe (series 4/image 45) and lateral left upper lobe/lingula (series 4/image 59), suggesting mild infection. 5 mm triangular nodule in the right upper and middle lobes (series 4/images 71 and 82), unchanged from 2015, benign. Suspected mild centrilobular emphysematous changes. No pleural effusion or pneumothorax. Upper Abdomen: Visualized upper abdomen is  grossly unremarkable. Musculoskeletal: Degenerative changes of the visualized thoracolumbar spine. Cervical spine fixation hardware, incompletely visualized. IMPRESSION: Mild patchy/ground-glass opacities in the left upper lobe/lingula, suggesting mild infection. Underlying chronic scarring in the right middle lobe, lingula, and bilateral lower  lobes. Aortic Atherosclerosis (ICD10-I70.0) and Emphysema (ICD10-J43.9). Electronically Signed   By: Julian Hy M.D.   On: 09/23/2018 16:32   US Carotid Bilateral  Result Date: 09/23/2018 CLINICAL DATA:  Somnolence and encephalopathy. History of dementia, hypertension, hyperlipidemia and diabetes. EXAM: BILATERAL CAROTID DUPLEX ULTRASOUND TECHNIQUE: Pearline Cables scale imaging, color Doppler and duplex ultrasound were performed of bilateral carotid and vertebral arteries in the neck. COMPARISON:  None. FINDINGS: Criteria: Quantification of carotid stenosis is based on velocity parameters that correlate the residual internal carotid diameter with NASCET-based stenosis levels, using the diameter of the distal internal carotid lumen as the denominator for stenosis measurement. The following velocity measurements were obtained: RIGHT ICA:  75/25 cm/sec CCA:  71/06 cm/sec SYSTOLIC ICA/CCA RATIO:  1.0 ECA:  84 cm/sec LEFT ICA:  64/21 cm/sec CCA:  26/94 cm/sec SYSTOLIC ICA/CCA RATIO:  0.7 ECA:  94 cm/sec RIGHT CAROTID ARTERY: Minimal calcified plaque in the common carotid artery. Moderate eccentric plaque in the carotid bulb. Mild calcified plaque at the right ICA origin. Velocities and waveforms are normal and estimated right ICA stenosis is less than 50%. RIGHT VERTEBRAL ARTERY: Limited ability to visualize the vertebral artery due to patient positioning. Sampling was therefore limited. There appears to be likely antegrade flow. LEFT CAROTID ARTERY: There is a mild amount of calcified plaque at the level of the left carotid bulb and proximal left ICA. Estimated left ICA stenosis is less than 50%. LEFT VERTEBRAL ARTERY: The left vertebral artery was not visualized, likely mostly due to patient positioning. Left vertebral artery occlusion cannot be excluded by the current study. IMPRESSION: 1. Plaque at the level of both carotid bulbs and proximal internal carotid arteries without significant stenosis identified.  Estimated bilateral ICA stenoses are less than 50%. 2. Poor visualization of both vertebral arteries. This is felt to be mostly due to patient positioning. Left vertebral artery occlusion cannot be excluded due to lack of visualization. Electronically Signed   By: Aletta Edouard M.D.   On: 09/23/2018 13:15   US Venous Img Lower Bilateral  Result Date: 09/22/2018 CLINICAL DATA:  Bilateral lower extremity pain. EXAM: BILATERAL LOWER EXTREMITY VENOUS DOPPLER ULTRASOUND TECHNIQUE: Gray-scale sonography with graded compression, as well as color Doppler and duplex ultrasound were performed to evaluate the lower extremity deep venous systems from the level of the common femoral vein and including the common femoral, femoral, profunda femoral, popliteal and calf veins including the posterior tibial, peroneal and gastrocnemius veins when visible. The superficial great saphenous vein was also interrogated. Spectral Doppler was utilized to evaluate flow at rest and with distal augmentation maneuvers in the common femoral, femoral and popliteal veins. COMPARISON:  None. FINDINGS: RIGHT LOWER EXTREMITY Common Femoral Vein: No evidence of thrombus. Normal compressibility, respiratory phasicity and response to augmentation. Saphenofemoral Junction: No evidence of thrombus. Normal compressibility and flow on color Doppler imaging. Profunda Femoral Vein: No evidence of thrombus. Normal compressibility and flow on color Doppler imaging. Femoral Vein: No evidence of thrombus. Normal compressibility, respiratory phasicity and response to augmentation. Popliteal Vein: No evidence of thrombus. Normal compressibility, respiratory phasicity and response to augmentation. Calf Veins: No evidence of thrombus. Normal compressibility and flow on color Doppler imaging. Superficial Great Saphenous Vein: No evidence of thrombus. Normal compressibility. Venous Reflux:  None. Other Findings:  None. LEFT LOWER EXTREMITY Common Femoral Vein: No  evidence of thrombus. Normal compressibility, respiratory phasicity and response to augmentation. Saphenofemoral Junction: No evidence of thrombus. Normal compressibility and flow on color Doppler imaging. Profunda Femoral Vein: No evidence of thrombus. Normal compressibility and flow on color Doppler imaging. Femoral Vein: No evidence of thrombus. Normal compressibility, respiratory phasicity and response to augmentation. Popliteal Vein: No evidence of thrombus. Normal compressibility, respiratory phasicity and response to augmentation. Calf Veins: No evidence of thrombus. Normal compressibility and flow on color Doppler imaging. Superficial Great Saphenous Vein: No evidence of thrombus. Normal compressibility. Venous Reflux:  None. Other Findings:  None. IMPRESSION: No evidence of deep venous thrombosis. Electronically Signed   By: Marijo Conception, M.D.   On: 09/22/2018 14:27    Assessment and Plan:   1. Hypoxia - being managed for aspiration pneumonitis by primary team, possibly underlying ILD - chronic LE edema, imaging without significant evidence of pulmonary. Fairly modest BNP. Does not appear volume overloaded by exam, do not suspect significant CHF contributing.    2. PSVT - telemetry review through epic, not available on telemetry monitor - K 4.8, Mg 2.2, normal TSH - started on lopressor yesterday, reorder tele to follow response - likely increased arrhythmogenic drive due to hypoxia, beta agonist breathing treatements  - follow rhythm on lopressor, titrate as needed  3. Anemia - 07/2018 Hgb 11, down to 8.7 on admission   We will follow telemetry remotely. No other cardiology recs at this time.    For questions or updates, please contact [place] Please consult [email or web address] for contact info under     Signed, Carlyle Dolly, MD  09/26/2018 9:34 AM

## 2018-09-26 NOTE — Discharge Summary (Signed)
Physician Discharge Summary  Samuel Ryan FGH:[id number] DOB: [birth year] DOA: 09/21/2018  PCP: [name], MD  Admit date: 09/21/2018 Discharge date: 09/26/2018  Admitted From: Home Disposition:  SNF  Recommendations for Outpatient Follow-up:  1. Follow up with PCP in 1-2 weeks 2. Please obtain BMP/CBC in one week 3. Please wean oxygen for saturation >92%     Discharge Condition: Stable CODE STATUS: FULL Diet recommendation: Heart Healthy/Carb Modified    Brief/Interim Summary: 84 year old male with a history of dementia, hypertension, hyperlipidemia, CKD stage III, type is mellitus type II, pulmonary fibrosis, and DVT July 2012 presented with 1 week history of increasing somnolence, forgetfulness, and decreased oral intake. Patient was recently admitted to the hospital from 08/03/2018 through 08/08/2018 when the patient was found on the floor encephalopathic. It was thought to be due to dehydration and acute on chronic renal failure. The patient was hydrated and subsequently discharged to skilled nursing facility. He has since returned home. The patient son also has noted that he has been dragging his left leg with increasing gait instability in the past week. The patient has not had any falls since his last admission it. At baseline, the patient is alert and oriented x2 and able to dress himself and feed himself. He does require assistance with bathing. The patient was taken to see his primary care provider on 09/20/2018. Urinalysis at that time was negative. In the emergency department, the patient was noted to be tachycardic and hypoxic with oxygen saturation 89%. BMP showed sodium 133, serum creatinine1.85, WBC 5.5.  Discharge Diagnoses:  Acute metabolic encephalopathy -Likely multifactorial including hypoxia, dehydration, and medication induced from his anticholinergic medications -Repeat serum B12--456 -Check ammonia--10 -Personally reviewed EKG--sinus  rhythm, right bundle branch block unchanged -Personally reviewed chest x-ray--chronic bibasilar atelectasisand increased interstitial markings unchanged -CVE--9.381 -Folic OFBP--10.2 -58/52/7782 urinalysis negative for pyuria -CT brain neg -unable to perform MRI due to inability to lay flat -improved, back to baseline  Acute respiratory failure with hypoxia -secondary to aspiration pneumonitis in setting of underlying ILD -VQ scan--very low probability -underlying ILD also contributing -09/23/18 CT chest--LUL and lingular patchy ground glass opacities -10/24 ABG--7.40/43/78/24 on 2L -remains on 2L--stable -please wean off for saturation >92% -viral respiratory panel--neg -continuedxopenex during the hospitalization  Paroxysmal SVT -HR 120s -started metoprolol tartrate 25 mg bid-->converted back to sinus -Echo EF 60-65%, Grade 1 DD, no WMA, ventricular dyssnergy -consult cardiology--appreciated--agrees with metoprolol -TSH 0.441  Aspiration pneumonitis -started IV clinda>>>po clinda x 4 more days to finish 1 week  Dysphagia -concerned about aspiration pneumonitis -speech therapy eval-->regular diet with thin liquids  Dehydration -po intake remains poor -startedIVF  Hyponatremia -Secondary to volume depletion -Continue IV fluids-->improved  CKD stage III -Baseline creatinine 1.5-1.8 -serum creatinine 1.80 on day of d/c  Diabetes mellitus type 2,uncontrolled with hyperglycemia -08/13/2018 hemoglobin A1c 8.0 -Continue Lantus 10 units daily -NovoLog sliding scale -CBGs controlled  Hyperlipidemia -Continue statin -am lipid panel--LDL 65  Dementia without behavioral disturbance -Patient is at risk for hospital delirium -Continue Aricept  Elevated D-dimer -venous duplex lower extremities--neg     Discharge Instructions   Allergies as of 09/26/2018      Reactions   Zosyn [piperacillin Sod-tazobactam So] Anaphylaxis, Swelling, Other (See  Comments)   Facial swelling Has patient had a PCN reaction causing immediate rash, facial/tongue/throat swelling, SOB or lightheadedness with hypotension: Yes Has patient had a PCN reaction causing severe rash involving mucus membranes or skin necrosis: No Has patient had a PCN reaction that required  hospitalization: No Has patient had a PCN reaction occurring within the last 10 years: Yes If all of the above answers are "NO", then may proceed with Cephalosporin use.   Ace Inhibitors Cough   Vancomycin Swelling, Other (See Comments)   Facial swelling      Medication List    TAKE these medications   acetaminophen 500 MG tablet Commonly known as:  TYLENOL Take 1,000 mg by mouth every 6 (six) hours as needed for mild pain, moderate pain or headache.   Carboxymeth-Glycerin-Polysorb 0.5-1-0.5 % Soln Place 1 drop into both eyes 4 (four) times daily as needed (for dry eye relief).   clindamycin 300 MG capsule Commonly known as:  CLEOCIN Take 1 capsule (300 mg total) by mouth 4 (four) times daily.   donepezil 10 MG tablet Commonly known as:  ARICEPT TAKE 1 TABLET AT BEDTIME   FEROSUL 325 (65 FE) MG tablet Generic drug:  ferrous sulfate TAKE 1 TABLET TWICE DAILY What changed:    how much to take  when to take this   glipiZIDE 10 MG 24 hr tablet Commonly known as:  GLUCOTROL XL Take 1 tablet (10 mg total) by mouth daily with breakfast.   glucose blood test strip TEST ONCE DAILY dx e11.9   LANTUS SOLOSTAR 100 UNIT/ML Solostar Pen Generic drug:  Insulin Glargine Inject 10-20 Units into the skin every morning. HOLD for blood sugar levels below 100   loratadine 10 MG tablet Commonly known as:  CLARITIN Take 10 mg by mouth every morning.   lovastatin 40 MG tablet Commonly known as:  MEVACOR TAKE 1 TABLET AT BEDTIME   metoprolol tartrate 25 MG tablet Commonly known as:  LOPRESSOR Take 1 tablet (25 mg total) by mouth 2 (two) times daily.   omeprazole 40 MG  capsule Commonly known as:  PRILOSEC TAKE 1 CAPSULE EVERY DAY What changed:  when to take this   Pen Needles 31G X 5 MM Misc 1 each by Does not apply route as directed. Use to inject lantus daily dx E11.65 What changed:  Another medication with the same name was added. Make sure you understand how and when to take each.   Insulin Pen Needle 29G X 5MM Misc 1 each by Does not apply route as directed. What changed:  You were already taking a medication with the same name, and this prescription was added. Make sure you understand how and when to take each.   sertraline 50 MG tablet Commonly known as:  ZOLOFT TAKE 1 TABLET EVERY DAY   tamsulosin 0.4 MG Caps capsule Commonly known as:  FLOMAX TAKE 1 CAPSULE EVERY DAY What changed:  when to take this   traMADol 50 MG tablet Commonly known as:  ULTRAM Take 1 tablet (50 mg total) by mouth every 12 (twelve) hours as needed for moderate pain. What changed:    when to take this  reasons to take this   Trospium Chloride 60 MG Cp24 Take 60 mg by mouth daily.   TRUE METRIX METER w/Device Kit With supplies, alcohol swabs as covered by insurance   Vitamin D (Ergocalciferol) 50000 units Caps capsule Commonly known as:  DRISDOL Take 1 capsule (50,000 Units total) by mouth every 7 (seven) days.      Contact information for after-discharge care    [facility] .   Service:  Skilled Nursing Contact information: [address] [place] [phone]  Allergies  Allergen Reactions  . Zosyn [Piperacillin Sod-Tazobactam So] Anaphylaxis, Swelling and Other (See Comments)    Facial swelling Has patient had a PCN reaction causing immediate rash, facial/tongue/throat swelling, SOB or lightheadedness with hypotension: Yes Has patient had a PCN reaction causing severe rash involving mucus membranes or skin necrosis: No Has patient had a PCN reaction that  required hospitalization: No Has patient had a PCN reaction occurring within the last 10 years: Yes If all of the above answers are "NO", then may proceed with Cephalosporin use.   . Ace Inhibitors Cough  . Vancomycin Swelling and Other (See Comments)    Facial swelling    Consultations:  cardiology   Procedures/Studies: Dg Chest 2 View  Result Date: 09/21/2018 CLINICAL DATA:  Weakness and confusion. EXAM: CHEST - 2 VIEW COMPARISON:  August 11, 2018 FINDINGS: Stable bibasilar changes, likely scar atelectasis. Stable cardiomegaly. No change in the mediastinum. No pneumothorax. IMPRESSION: Chronic bibasilar atelectasis or scar.  No acute change. Electronically Signed   By: Dorise Bullion III M.D   On: 09/21/2018 20:08   Ct Head Wo Contrast  Result Date: 09/21/2018 CLINICAL DATA:  Five-day history of confusion. EXAM: CT HEAD WITHOUT CONTRAST TECHNIQUE: Contiguous axial images were obtained from the base of the skull through the vertex without intravenous contrast. COMPARISON:  Head CT 08/03/2018 FINDINGS: Brain: Stable age related cerebral atrophy, ventriculomegaly and periventricular white matter disease. No extra-axial fluid collections are identified. No CT findings for acute hemispheric infarction or intracranial hemorrhage. No mass lesions. Stable bilateral basal ganglia calcifications. The brainstem and cerebellum are normal. Vascular: No hyperdense vessel or unexpected calcification. Skull: No skull fracture or bone lesion. Stable hyperostosis frontalis interna. Sinuses/Orbits: The paranasal sinuses and mastoid air cells are grossly clear. The globes are intact. Other: No scalp lesions or hematoma. IMPRESSION: 1. Stable cerebral atrophy, ventriculomegaly and periventricular white matter disease. 2. No new/acute intracranial findings. Electronically Signed   By: Marijo Sanes M.D.   On: 09/21/2018 20:38   Ct Chest Wo Contrast  Result Date: 09/23/2018 CLINICAL DATA:  Acute  respiratory failure, hypoxia EXAM: CT CHEST WITHOUT CONTRAST TECHNIQUE: Multidetector CT imaging of the chest was performed following the standard protocol without IV contrast. COMPARISON:  Chest radiographs dated 09/21/2018. CTA chest dated 08/09/2018. FINDINGS: Cardiovascular: The heart is top-normal in size. No pericardial effusion. No evidence of thoracic aortic aneurysm. Atherosclerotic calcifications of the aortic arch. Mild coronary atherosclerosis of the LAD. Mediastinum/Nodes: Small mediastinal lymph nodes, including an 11 mm short axis AP window node, likely reactive. Left thyroid is enlarged/nodular, within aggregate 3.1 cm left thyroid nodule (series 2/image 26), unchanged from 2015. Lungs/Pleura: Evaluation of the lung parenchyma is constrained by respiratory motion. Scarring in the right middle lobe, lingula, and bilateral lower lobes, chronic. Mild patchy/ground-glass opacities in the medial left upper lobe (series 4/image 45) and lateral left upper lobe/lingula (series 4/image 59), suggesting mild infection. 5 mm triangular nodule in the right upper and middle lobes (series 4/images 71 and 82), unchanged from 2015, benign. Suspected mild centrilobular emphysematous changes. No pleural effusion or pneumothorax. Upper Abdomen: Visualized upper abdomen is grossly unremarkable. Musculoskeletal: Degenerative changes of the visualized thoracolumbar spine. Cervical spine fixation hardware, incompletely visualized. IMPRESSION: Mild patchy/ground-glass opacities in the left upper lobe/lingula, suggesting mild infection. Underlying chronic scarring in the right middle lobe, lingula, and bilateral lower lobes. Aortic Atherosclerosis (ICD10-I70.0) and Emphysema (ICD10-J43.9). Electronically Signed   By: Julian Hy M.D.   On: 09/23/2018 16:32   US Carotid  Bilateral  Result Date: 09/23/2018 CLINICAL DATA:  Somnolence and encephalopathy. History of dementia, hypertension, hyperlipidemia and diabetes.  EXAM: BILATERAL CAROTID DUPLEX ULTRASOUND TECHNIQUE: Pearline Cables scale imaging, color Doppler and duplex ultrasound were performed of bilateral carotid and vertebral arteries in the neck. COMPARISON:  None. FINDINGS: Criteria: Quantification of carotid stenosis is based on velocity parameters that correlate the residual internal carotid diameter with NASCET-based stenosis levels, using the diameter of the distal internal carotid lumen as the denominator for stenosis measurement. The following velocity measurements were obtained: RIGHT ICA:  75/25 cm/sec CCA:  76/81 cm/sec SYSTOLIC ICA/CCA RATIO:  1.0 ECA:  84 cm/sec LEFT ICA:  64/21 cm/sec CCA:  15/72 cm/sec SYSTOLIC ICA/CCA RATIO:  0.7 ECA:  94 cm/sec RIGHT CAROTID ARTERY: Minimal calcified plaque in the common carotid artery. Moderate eccentric plaque in the carotid bulb. Mild calcified plaque at the right ICA origin. Velocities and waveforms are normal and estimated right ICA stenosis is less than 50%. RIGHT VERTEBRAL ARTERY: Limited ability to visualize the vertebral artery due to patient positioning. Sampling was therefore limited. There appears to be likely antegrade flow. LEFT CAROTID ARTERY: There is a mild amount of calcified plaque at the level of the left carotid bulb and proximal left ICA. Estimated left ICA stenosis is less than 50%. LEFT VERTEBRAL ARTERY: The left vertebral artery was not visualized, likely mostly due to patient positioning. Left vertebral artery occlusion cannot be excluded by the current study. IMPRESSION: 1. Plaque at the level of both carotid bulbs and proximal internal carotid arteries without significant stenosis identified. Estimated bilateral ICA stenoses are less than 50%. 2. Poor visualization of both vertebral arteries. This is felt to be mostly due to patient positioning. Left vertebral artery occlusion cannot be excluded due to lack of visualization. Electronically Signed   By: Aletta Edouard M.D.   On: 09/23/2018 13:15   Nm  Pulmonary Perf And Vent  Result Date: 09/22/2018 CLINICAL DATA:  Shortness of breath, weakness, elevated D-dimer, suspected pulmonary embolism EXAM: NUCLEAR MEDICINE VENTILATION - PERFUSION LUNG SCAN TECHNIQUE: Ventilation images were obtained in multiple projections using inhaled aerosol Tc-42mDTPA. Perfusion images were obtained in multiple projections after intravenous injection of Tc-948mAA. RADIOPHARMACEUTICALS:  33 mCi of Tc-9994mPA aerosol inhalation and 4.4 mCi Tc99m25m IV COMPARISON:  None Correlation: Chest radiograph 09/21/2018 FINDINGS: Ventilation: Central airway deposition of aerosol. Poor peripheral distribution of aerosol to upper lobes. Diminished ventilation LEFT lower lobe medially. Perfusion: No segmental or subsegmental perfusion defects. Minimal irregularity of perfusion at the posterior RIGHT lung, nonsegmental. IMPRESSION: Very low probability for pulmonary embolism. Electronically Signed   By: MarkLavonia Dana.   On: 09/22/2018 09:54   Us VKoreaous Img Lower Bilateral  Result Date: 09/22/2018 CLINICAL DATA:  Bilateral lower extremity pain. EXAM: BILATERAL LOWER EXTREMITY VENOUS DOPPLER ULTRASOUND TECHNIQUE: Gray-scale sonography with graded compression, as well as color Doppler and duplex ultrasound were performed to evaluate the lower extremity deep venous systems from the level of the common femoral vein and including the common femoral, femoral, profunda femoral, popliteal and calf veins including the posterior tibial, peroneal and gastrocnemius veins when visible. The superficial great saphenous vein was also interrogated. Spectral Doppler was utilized to evaluate flow at rest and with distal augmentation maneuvers in the common femoral, femoral and popliteal veins. COMPARISON:  None. FINDINGS: RIGHT LOWER EXTREMITY Common Femoral Vein: No evidence of thrombus. Normal compressibility, respiratory phasicity and response to augmentation. Saphenofemoral Junction: No evidence of  thrombus. Normal compressibility and flow on color  Doppler imaging. Profunda Femoral Vein: No evidence of thrombus. Normal compressibility and flow on color Doppler imaging. Femoral Vein: No evidence of thrombus. Normal compressibility, respiratory phasicity and response to augmentation. Popliteal Vein: No evidence of thrombus. Normal compressibility, respiratory phasicity and response to augmentation. Calf Veins: No evidence of thrombus. Normal compressibility and flow on color Doppler imaging. Superficial Great Saphenous Vein: No evidence of thrombus. Normal compressibility. Venous Reflux:  None. Other Findings:  None. LEFT LOWER EXTREMITY Common Femoral Vein: No evidence of thrombus. Normal compressibility, respiratory phasicity and response to augmentation. Saphenofemoral Junction: No evidence of thrombus. Normal compressibility and flow on color Doppler imaging. Profunda Femoral Vein: No evidence of thrombus. Normal compressibility and flow on color Doppler imaging. Femoral Vein: No evidence of thrombus. Normal compressibility, respiratory phasicity and response to augmentation. Popliteal Vein: No evidence of thrombus. Normal compressibility, respiratory phasicity and response to augmentation. Calf Veins: No evidence of thrombus. Normal compressibility and flow on color Doppler imaging. Superficial Great Saphenous Vein: No evidence of thrombus. Normal compressibility. Venous Reflux:  None. Other Findings:  None. IMPRESSION: No evidence of deep venous thrombosis. Electronically Signed   By: Marijo Conception, M.D.   On: 09/22/2018 14:27        Discharge Exam: Vitals:   09/26/18 0805 09/26/18 0814  BP: 121/79   Pulse: 100   Resp:    Temp:    SpO2: 97% 96%   Vitals:   09/26/18 0500 09/26/18 0602 09/26/18 0805 09/26/18 0814  BP:  114/76 121/79   Pulse:  (!) 101 100   Resp:  17    Temp:  98.2 F (36.8 C)    TempSrc:  Oral    SpO2:  96% 97% 96%  Weight: 113.9 kg       General: Pt is alert,  awake, not in acute distress Cardiovascular: RRR, S1/S2 +, no rubs, no gallops Respiratory: bibasilar crackles, no wheeze Abdominal: Soft, NT, ND, bowel sounds + Extremities: trace LE edema, no cyanosis   The results of significant diagnostics from this hospitalization (including imaging, microbiology, ancillary and laboratory) are listed below for reference.    Significant Diagnostic Studies: Dg Chest 2 View  Result Date: 09/21/2018 CLINICAL DATA:  Weakness and confusion. EXAM: CHEST - 2 VIEW COMPARISON:  August 11, 2018 FINDINGS: Stable bibasilar changes, likely scar atelectasis. Stable cardiomegaly. No change in the mediastinum. No pneumothorax. IMPRESSION: Chronic bibasilar atelectasis or scar.  No acute change. Electronically Signed   By: Dorise Bullion III M.D   On: 09/21/2018 20:08   Ct Head Wo Contrast  Result Date: 09/21/2018 CLINICAL DATA:  Five-day history of confusion. EXAM: CT HEAD WITHOUT CONTRAST TECHNIQUE: Contiguous axial images were obtained from the base of the skull through the vertex without intravenous contrast. COMPARISON:  Head CT 08/03/2018 FINDINGS: Brain: Stable age related cerebral atrophy, ventriculomegaly and periventricular white matter disease. No extra-axial fluid collections are identified. No CT findings for acute hemispheric infarction or intracranial hemorrhage. No mass lesions. Stable bilateral basal ganglia calcifications. The brainstem and cerebellum are normal. Vascular: No hyperdense vessel or unexpected calcification. Skull: No skull fracture or bone lesion. Stable hyperostosis frontalis interna. Sinuses/Orbits: The paranasal sinuses and mastoid air cells are grossly clear. The globes are intact. Other: No scalp lesions or hematoma. IMPRESSION: 1. Stable cerebral atrophy, ventriculomegaly and periventricular white matter disease. 2. No new/acute intracranial findings. Electronically Signed   By: Marijo Sanes M.D.   On: 09/21/2018 20:38   Ct Chest Wo  Contrast  Result Date:  09/23/2018 CLINICAL DATA:  Acute respiratory failure, hypoxia EXAM: CT CHEST WITHOUT CONTRAST TECHNIQUE: Multidetector CT imaging of the chest was performed following the standard protocol without IV contrast. COMPARISON:  Chest radiographs dated 09/21/2018. CTA chest dated 08/09/2018. FINDINGS: Cardiovascular: The heart is top-normal in size. No pericardial effusion. No evidence of thoracic aortic aneurysm. Atherosclerotic calcifications of the aortic arch. Mild coronary atherosclerosis of the LAD. Mediastinum/Nodes: Small mediastinal lymph nodes, including an 11 mm short axis AP window node, likely reactive. Left thyroid is enlarged/nodular, within aggregate 3.1 cm left thyroid nodule (series 2/image 26), unchanged from 2015. Lungs/Pleura: Evaluation of the lung parenchyma is constrained by respiratory motion. Scarring in the right middle lobe, lingula, and bilateral lower lobes, chronic. Mild patchy/ground-glass opacities in the medial left upper lobe (series 4/image 45) and lateral left upper lobe/lingula (series 4/image 59), suggesting mild infection. 5 mm triangular nodule in the right upper and middle lobes (series 4/images 71 and 82), unchanged from 2015, benign. Suspected mild centrilobular emphysematous changes. No pleural effusion or pneumothorax. Upper Abdomen: Visualized upper abdomen is grossly unremarkable. Musculoskeletal: Degenerative changes of the visualized thoracolumbar spine. Cervical spine fixation hardware, incompletely visualized. IMPRESSION: Mild patchy/ground-glass opacities in the left upper lobe/lingula, suggesting mild infection. Underlying chronic scarring in the right middle lobe, lingula, and bilateral lower lobes. Aortic Atherosclerosis (ICD10-I70.0) and Emphysema (ICD10-J43.9). Electronically Signed   By: Julian Hy M.D.   On: 09/23/2018 16:32   US Carotid Bilateral  Result Date: 09/23/2018 CLINICAL DATA:  Somnolence and encephalopathy. History  of dementia, hypertension, hyperlipidemia and diabetes. EXAM: BILATERAL CAROTID DUPLEX ULTRASOUND TECHNIQUE: Pearline Cables scale imaging, color Doppler and duplex ultrasound were performed of bilateral carotid and vertebral arteries in the neck. COMPARISON:  None. FINDINGS: Criteria: Quantification of carotid stenosis is based on velocity parameters that correlate the residual internal carotid diameter with NASCET-based stenosis levels, using the diameter of the distal internal carotid lumen as the denominator for stenosis measurement. The following velocity measurements were obtained: RIGHT ICA:  75/25 cm/sec CCA:  39/76 cm/sec SYSTOLIC ICA/CCA RATIO:  1.0 ECA:  84 cm/sec LEFT ICA:  64/21 cm/sec CCA:  73/41 cm/sec SYSTOLIC ICA/CCA RATIO:  0.7 ECA:  94 cm/sec RIGHT CAROTID ARTERY: Minimal calcified plaque in the common carotid artery. Moderate eccentric plaque in the carotid bulb. Mild calcified plaque at the right ICA origin. Velocities and waveforms are normal and estimated right ICA stenosis is less than 50%. RIGHT VERTEBRAL ARTERY: Limited ability to visualize the vertebral artery due to patient positioning. Sampling was therefore limited. There appears to be likely antegrade flow. LEFT CAROTID ARTERY: There is a mild amount of calcified plaque at the level of the left carotid bulb and proximal left ICA. Estimated left ICA stenosis is less than 50%. LEFT VERTEBRAL ARTERY: The left vertebral artery was not visualized, likely mostly due to patient positioning. Left vertebral artery occlusion cannot be excluded by the current study. IMPRESSION: 1. Plaque at the level of both carotid bulbs and proximal internal carotid arteries without significant stenosis identified. Estimated bilateral ICA stenoses are less than 50%. 2. Poor visualization of both vertebral arteries. This is felt to be mostly due to patient positioning. Left vertebral artery occlusion cannot be excluded due to lack of visualization. Electronically Signed    By: Aletta Edouard M.D.   On: 09/23/2018 13:15   Nm Pulmonary Perf And Vent  Result Date: 09/22/2018 CLINICAL DATA:  Shortness of breath, weakness, elevated D-dimer, suspected pulmonary embolism EXAM: NUCLEAR MEDICINE VENTILATION - PERFUSION LUNG SCAN TECHNIQUE:  Ventilation images were obtained in multiple projections using inhaled aerosol Tc-27mDTPA. Perfusion images were obtained in multiple projections after intravenous injection of Tc-954mAA. RADIOPHARMACEUTICALS:  33 mCi of Tc-9973mPA aerosol inhalation and 4.4 mCi Tc99m27m IV COMPARISON:  None Correlation: Chest radiograph 09/21/2018 FINDINGS: Ventilation: Central airway deposition of aerosol. Poor peripheral distribution of aerosol to upper lobes. Diminished ventilation LEFT lower lobe medially. Perfusion: No segmental or subsegmental perfusion defects. Minimal irregularity of perfusion at the posterior RIGHT lung, nonsegmental. IMPRESSION: Very low probability for pulmonary embolism. Electronically Signed   By: MarkLavonia Dana.   On: 09/22/2018 09:54   Us VKoreaous Img Lower Bilateral  Result Date: 09/22/2018 CLINICAL DATA:  Bilateral lower extremity pain. EXAM: BILATERAL LOWER EXTREMITY VENOUS DOPPLER ULTRASOUND TECHNIQUE: Gray-scale sonography with graded compression, as well as color Doppler and duplex ultrasound were performed to evaluate the lower extremity deep venous systems from the level of the common femoral vein and including the common femoral, femoral, profunda femoral, popliteal and calf veins including the posterior tibial, peroneal and gastrocnemius veins when visible. The superficial great saphenous vein was also interrogated. Spectral Doppler was utilized to evaluate flow at rest and with distal augmentation maneuvers in the common femoral, femoral and popliteal veins. COMPARISON:  None. FINDINGS: RIGHT LOWER EXTREMITY Common Femoral Vein: No evidence of thrombus. Normal compressibility, respiratory phasicity and response to  augmentation. Saphenofemoral Junction: No evidence of thrombus. Normal compressibility and flow on color Doppler imaging. Profunda Femoral Vein: No evidence of thrombus. Normal compressibility and flow on color Doppler imaging. Femoral Vein: No evidence of thrombus. Normal compressibility, respiratory phasicity and response to augmentation. Popliteal Vein: No evidence of thrombus. Normal compressibility, respiratory phasicity and response to augmentation. Calf Veins: No evidence of thrombus. Normal compressibility and flow on color Doppler imaging. Superficial Great Saphenous Vein: No evidence of thrombus. Normal compressibility. Venous Reflux:  None. Other Findings:  None. LEFT LOWER EXTREMITY Common Femoral Vein: No evidence of thrombus. Normal compressibility, respiratory phasicity and response to augmentation. Saphenofemoral Junction: No evidence of thrombus. Normal compressibility and flow on color Doppler imaging. Profunda Femoral Vein: No evidence of thrombus. Normal compressibility and flow on color Doppler imaging. Femoral Vein: No evidence of thrombus. Normal compressibility, respiratory phasicity and response to augmentation. Popliteal Vein: No evidence of thrombus. Normal compressibility, respiratory phasicity and response to augmentation. Calf Veins: No evidence of thrombus. Normal compressibility and flow on color Doppler imaging. Superficial Great Saphenous Vein: No evidence of thrombus. Normal compressibility. Venous Reflux:  None. Other Findings:  None. IMPRESSION: No evidence of deep venous thrombosis. Electronically Signed   By: JameMarijo ConceptionD.   On: 09/22/2018 14:27     Microbiology: Recent Results (from the past 240 hour(s))  Culture, Urine     Status: Abnormal   Collection Time: 09/20/18  7:00 PM  Result Value Ref Range Status   Specimen Description   Final    URINE, RANDOM Performed at [facility] [address] [id number] [place]s   Final     NONE Performed at [facility] [address] [id number] (A)  Final    <10,000 COLONIES/mL INSIGNIFICANT GROWTH Performed at [place] [address] [id number] Status 09/22/2018 FINAL  Final  Respiratory Panel by PCR     Status: None   Collection Time: 09/23/18 11:50 AM  Result Value Ref Range Status   Adenovirus NOT  DETECTED NOT DETECTED Final   Coronavirus 229E NOT DETECTED NOT DETECTED Final   Coronavirus HKU1 NOT DETECTED NOT DETECTED Final   Coronavirus NL63 NOT DETECTED NOT DETECTED Final   Coronavirus OC43 NOT DETECTED NOT DETECTED Final   Metapneumovirus NOT DETECTED NOT DETECTED Final   Rhinovirus / Enterovirus NOT DETECTED NOT DETECTED Final   Influenza A NOT DETECTED NOT DETECTED Final   Influenza B NOT DETECTED NOT DETECTED Final   Parainfluenza Virus 1 NOT DETECTED NOT DETECTED Final   Parainfluenza Virus 2 NOT DETECTED NOT DETECTED Final   Parainfluenza Virus 3 NOT DETECTED NOT DETECTED Final   Parainfluenza Virus 4 NOT DETECTED NOT DETECTED Final   Respiratory Syncytial Virus NOT DETECTED NOT DETECTED Final   Bordetella pertussis NOT DETECTED NOT DETECTED Final   Chlamydophila pneumoniae NOT DETECTED NOT DETECTED Final   Mycoplasma pneumoniae NOT DETECTED NOT DETECTED Final    Comment: Performed at [facility], [place] [address], [place],  [address]     Labs: Basic Metabolic Panel: Recent Labs  Lab 09/22/18 [phone] 09/23/18 8832 09/24/18 0603 09/25/18 0605 09/26/18 0517  NA 138 137 135 135 136  K 4.4 4.6 4.7 4.5 4.8  CL 103 101 104 104 105  CO2 '28 28 24 23 24  ' GLUCOSE 116* 161* 154* 148* 133*  BUN '19 20 20 21 ' 24*  CREATININE 1.77* 1.89* 1.74* 1.83* 1.80*  CALCIUM 8.4* 8.3* 8.0* 8.1* 8.0*  MG  --   --  2.3 2.2  --    Liver Function Tests: Recent Labs  Lab 09/21/18 1914 09/22/18 0643  AST 16 17  ALT 13 14  ALKPHOS 65 64  BILITOT 0.6 0.8  PROT 7.2 7.6  ALBUMIN 2.8* 2.9*    No results for input(s): LIPASE, AMYLASE in the last 168 hours. Recent Labs  Lab 09/22/18 0953  AMMONIA 10   CBC: Recent Labs  Lab 09/21/18 1914 09/22/18 0643  WBC 5.5 5.2  NEUTROABS 3.1  --   HGB 8.7* 10.6*  HCT 27.8* 34.3*  MCV 97.5 95.0  PLT 264 246   Cardiac Enzymes: Recent Labs  Lab 09/21/18 1914 09/22/18 0021 09/22/18 0643 09/22/18 0953 09/22/18 1217  CKTOTAL  --   --   --  61  --   TROPONINI <0.03 <0.03 <0.03  --  <0.03   BNP: Invalid input(s): POCBNP CBG: Recent Labs  Lab 09/25/18 1130 09/25/18 1632 09/25/18 2147 09/26/18 0811 09/26/18 1210  GLUCAP 159* 157* 165* 112* 163*    Time coordinating discharge:  36 minutes  Signed:  Orson Eva, DO [facility] Pager: [phone] 09/26/2018, 2:27 PM

## 2018-09-26 NOTE — Progress Notes (Signed)
Physical Therapy Treatment Patient Details Name: Samuel Ryan MRN: [id number] DOB: [birth year] Today's Date: 09/26/2018    History of Present Illness 84 year old male with a history of dementia, hypertension, hyperlipidemia, CKD stage III, type is mellitus type II, pulmonary fibrosis, and DVT July 2012 presented with 1 week history of increasing somnolence, forgetfulness, and decreased oral intake.  Patient was recently admitted to the hospital from 08/03/2018 through 08/08/2018 when the patient was found on the floor encephalopathic.  It was thought to be due to dehydration and acute on chronic renal failure.  The patient was hydrated and subsequently discharged to skilled nursing facility.  He has since returned home.  The patient son also has noted that he has been dragging his left leg with increasing gait instability in the past week.  The patient has not had any falls since his last admission it.  At baseline, the patient is alert and oriented x2 and able to dress himself and feed himself.  He does require assistance with bathing.  The patient was taken to see his primary care provider on 09/20/2018.  Urinalysis at that time was negative.  In the emergency department, the patient was noted to be tachycardic and hypoxic with oxygen saturation 89%.  BMP showed sodium 133, serum creatinine 1.85, WBC 5.5.    PT Comments    Patient had much difficulty sitting up at bedside requiring frequent hands on assist with most difficulty attempting to scoot forward at bedside.  Patient able to transfer to [facility] for a bowel movement, required verbal/tactile cueing for proper hand placement during sit to stands, limited to a few steps at bedside due to BLE weakness, poor standing balance.  Patient tolerated sitting up in chair after therapy.  Patient will benefit from continued physical therapy in hospital and recommended venue below to increase strength, balance, endurance for safe ADLs and gait.    Follow Up  Recommendations  SNF     Equipment Recommendations  None recommended by PT    Recommendations for Other Services       Precautions / Restrictions Precautions Precautions: Fall Restrictions Weight Bearing Restrictions: No    Mobility  Bed Mobility Overal bed mobility: Needs Assistance Bed Mobility: Supine to Sit     Supine to sit: Mod assist     General bed mobility comments: slow labored movement with much diffiuclty scooting to bedside  Transfers Overall transfer level: Needs assistance Equipment used: Rolling walker (2 wheeled) Transfers: Sit to/from [facility] Sit to Stand: Mod assist Stand pivot transfers: Mod assist       General transfer comment: frequent verbal/tactile cueing for proper hand placement during transfers to [facility] and chair  Ambulation/Gait Ambulation/Gait assistance: Mod assist Gait Distance (Feet): 5 Feet Assistive device: Rolling walker (2 wheeled) Gait Pattern/deviations: Decreased step length - right;Decreased step length - left;Decreased stride length Gait velocity: slow   General Gait Details: limited to 7-8 slow unsteady side steps at bedside due to BLE weakness, poor standing balance, required frequent sitting rest breaks   Stairs             Wheelchair Mobility    Modified Rankin (Stroke Patients Only)       Balance Overall balance assessment: Needs assistance Sitting-balance support: Feet supported Sitting balance-Leahy Scale: Good     Standing balance support: Bilateral upper extremity supported;During functional activity Standing balance-Leahy Scale: Poor Standing balance comment: fair/poor with RW  Cognition Arousal/Alertness: Awake/alert Behavior During Therapy: WFL for tasks assessed/performed Overall Cognitive Status: Within Functional Limits for tasks assessed                                        Exercises General Exercises -  Lower Extremity Long Arc Quad: Seated;AROM;Strengthening;Both;10 reps Hip Flexion/Marching: Seated;Strengthening;Both;10 reps;AROM Toe Raises: Seated;Strengthening;AROM;Both Heel Raises: Seated;AROM;Strengthening;Both;10 reps    General Comments        Pertinent Vitals/Pain Pain Assessment: No/denies pain    Home Living                      Prior Function            PT Goals (current goals can now be found in the care plan section) Acute Rehab PT Goals Patient Stated Goal: To o improve strength and be able to walk around home again with rollator PT Goal Formulation: With patient/family Time For Goal Achievement: 09/29/18 Potential to Achieve Goals: Good Progress towards PT goals: Progressing toward goals    Frequency    Min 3X/week      PT Plan Current plan remains appropriate    Co-evaluation              AM-PAC PT "6 Clicks" Daily Activity  Outcome Measure  Difficulty turning over in bed (including adjusting bedclothes, sheets and blankets)?: Unable Difficulty moving from lying on back to sitting on the side of the bed? : Unable Difficulty sitting down on and standing up from a chair with arms (e.g., wheelchair, bedside commode, etc,.)?: Unable Help needed moving to and from a bed to chair (including a wheelchair)?: A Lot Help needed walking in hospital room?: A Lot Help needed climbing 3-5 steps with a railing? : Total 6 Click Score: 8    End of Session Equipment Utilized During Treatment: Gait belt Activity Tolerance: Patient tolerated treatment well;Patient limited by fatigue Patient left: in chair;with call bell/phone within reach;with family/visitor present Nurse Communication: Mobility status PT Visit Diagnosis: Unsteadiness on feet (R26.81);Difficulty in walking, not elsewhere classified (R26.2);Muscle weakness (generalized) (M62.81);Other abnormalities of gait and mobility (R26.89)     Time: 6269-4854 PT Time Calculation (min)  (ACUTE ONLY): 37 min  Charges:  $Therapeutic Exercise: 8-22 mins $Therapeutic Activity: 8-22 mins                     3:09 PM, 09/26/18 Samuel Ryan, MPT Physical Therapist with [facility] 336 [phone] office [phone] mobile phone

## 2018-09-26 NOTE — Telephone Encounter (Signed)
Patient is currently admitted and I cannot refill again at this time but they were all sent previously

## 2018-09-28 ENCOUNTER — Encounter: Payer: Self-pay | Admitting: Internal Medicine

## 2018-09-28 ENCOUNTER — Non-Acute Institutional Stay (SKILLED_NURSING_FACILITY): Payer: Medicare HMO | Admitting: Internal Medicine

## 2018-09-28 DIAGNOSIS — I471 Supraventricular tachycardia: Secondary | ICD-10-CM | POA: Diagnosis not present

## 2018-09-28 DIAGNOSIS — J690 Pneumonitis due to inhalation of food and vomit: Secondary | ICD-10-CM

## 2018-09-28 DIAGNOSIS — G309 Alzheimer's disease, unspecified: Secondary | ICD-10-CM | POA: Diagnosis not present

## 2018-09-28 DIAGNOSIS — F0280 Dementia in other diseases classified elsewhere without behavioral disturbance: Secondary | ICD-10-CM | POA: Diagnosis not present

## 2018-09-28 DIAGNOSIS — E1165 Type 2 diabetes mellitus with hyperglycemia: Secondary | ICD-10-CM | POA: Diagnosis not present

## 2018-09-28 NOTE — Progress Notes (Signed)
Provider: Veleta Miners MD  Location:    [facility] Room Number: 136/P Place of Service:  SNF [phone])  PCP: [name], MD Patient Care Team: [name], MD as PCP - General (Family Medicine) McKenzie, [name], MD as Consulting Physician (Urology)  Extended Emergency Contact Information Primary Emergency Contact: [facility] Address: [place], [place] [address] Johnnette Litter of [place] Phone: [phone] Relation: Son  Code Status: DNR Goals of Care: Advanced Directive information Advanced Directives 09/28/2018  Does Patient Have a Medical Advance Directive? Yes  Type of Advance Directive Out of facility DNR (pink MOST or yellow form)  Does patient want to make changes to medical advance directive? No - Patient declined  Copy of [place] in Chart? No - copy requested  Would patient like information on creating a medical advance directive? -  Pre-existing out of facility DNR order (yellow form or pink MOST form) -      Chief Complaint  Patient presents with  . New Admit To SNF    New Admission Visit    HPI: Patient is a 84 y.o. male seen today for admission to SNF for therapy. Patient was admitted to Hospital from 10/24-10/29 for Lethargy and Hypoxia  Patient has h/o Diabetes Mellitus Type 2, CKD, Hyperlipidemia,  Urinary InContinence.,Anemia, Dementia, Obesity  He lives with his son who brought him to the Hospital for Lethargy and Increased confusion. IN ED he was found to have Hypoxia. His CT scan was positive for small upper Lobe infiltrate. He was treated for Aspiration and Now is discharged to SNF  on Clindamycin. Patient was also started on Lopressor for Tachycardia. His VQ scan was negative for any PE.  He lives with his Son who works during day time.[name] He is independent in his ADL He was sitting in his wheelchair and had no new complains. He was unable to give me any detail  history. Denied any SOB or Chest pain or Cough.   Past Medical History:  Diagnosis Date  . Acute cholecystitis 11/2012   Drained percutaneously  . Anxiety   . BPH (benign prostatic hypertrophy)   . Deep venous thrombosis (HCC)    Left leg, diagnosed 7/12  . Dementia [facility])    Needs supervision for safety per son - POA  . Diverticulitis   . Essential hypertension, benign   . GERD (gastroesophageal reflux disease)   . History of pneumonia    Prior to 2012  . Incontinence of urine   . Mixed hyperlipidemia   . Osteoarthritis   . Pulmonary embolism ([place])    Diagnosed 7/12  . Type 2 diabetes mellitus ([place])    Past Surgical History:  Procedure Laterality Date  . CARPAL TUNNEL RELEASE    . COLONOSCOPY  08/31/2012   Procedure: COLONOSCOPY;  Surgeon: Rogene Houston, MD;  Location: [facility];  Service: Endoscopy;  Laterality: N/A;  830  . CYSTOSCOPY WITH INJECTION N/A 05/15/2018   Procedure: CYSTOSCOPY WITH BOTOX INJECTION;  Surgeon: Cleon Gustin, MD;  Location: [facility] ORS;  Service: Urology;  Laterality: N/A;  . CYSTOSCOPY WITH INSERTION OF UROLIFT N/A 09/02/2016   Procedure: CYSTOSCOPY WITH INSERTION OF UROLIFT;  Surgeon: Cleon Gustin, MD;  Location: [facility];  Service: Urology;  Laterality: N/A;  . EYE SURGERY Bilateral    bilateral cataract  . Right total knee replacement  04/23/2011  . SPINE  SURGERY  prior to 2012   Neck fusion  . UMBILICAL HERNIA REPAIR     umbilical    reports that he has never smoked. He has never used smokeless tobacco. He reports that he does not drink alcohol or use drugs. Social History   Socioeconomic History  . Marital status: Widowed    Spouse name: Not on file  . Number of children: Not on file  . Years of education: Not on file  . Highest education level: Not on file  Occupational History  . Not on file  Social Needs  . Financial resource strain: Not on file  . Food insecurity:    Worry: Not on file     Inability: Not on file  . Transportation needs:    Medical: Not on file    Non-medical: Not on file  Tobacco Use  . Smoking status: Never Smoker  . Smokeless tobacco: Never Used  Substance and Sexual Activity  . Alcohol use: No  . Drug use: No  . Sexual activity: Never  Lifestyle  . Physical activity:    Days per week: Not on file    Minutes per session: Not on file  . Stress: Not on file  Relationships  . Social connections:    Talks on phone: Not on file    Gets together: Not on file    Attends religious service: Not on file    Active member of club or organization: Not on file    Attends meetings of clubs or organizations: Not on file    Relationship status: Not on file  . Intimate partner violence:    Fear of current or ex partner: Not on file    Emotionally abused: Not on file    Physically abused: Not on file    Forced sexual activity: Not on file  Other Topics Concern  . Not on file  Social History Narrative  . Not on file    Functional Status Survey:    Family History  Problem Relation Age of Onset  . Cancer Mother        Stomach  . Diabetes Sister   . Hypertension Sister   . Diabetes Brother   . Cancer Brother        Gallbladder  . Seizures Son        due to meningitis as a child    Health Maintenance  Topic Date Due  . OPHTHALMOLOGY EXAM  10/29/2018 (Originally 09/21/2016)  . URINE MICROALBUMIN  10/29/2018 (Originally 02/24/2018)  . TETANUS/TDAP  12/12/2018 (Originally 07/28/2014)  . FOOT EXAM  01/28/2019  . HEMOGLOBIN A1C  02/01/2019  . INFLUENZA VACCINE  Completed  . PNA vac Low Risk Adult  Completed    Allergies  Allergen Reactions  . Zosyn [Piperacillin Sod-Tazobactam So] Anaphylaxis, Swelling and Other (See Comments)    Facial swelling Has patient had a PCN reaction causing immediate rash, facial/tongue/throat swelling, SOB or lightheadedness with hypotension: Yes Has patient had a PCN reaction causing severe rash involving mucus  membranes or skin necrosis: No Has patient had a PCN reaction that required hospitalization: No Has patient had a PCN reaction occurring within the last 10 years: Yes If all of the above answers are "NO", then may proceed with Cephalosporin use.   . Ace Inhibitors Cough  . Vancomycin Swelling and Other (See Comments)    Facial swelling    Outpatient Encounter Medications as of 09/28/2018  Medication Sig  . acetaminophen (TYLENOL) 500 MG tablet Take 1,000  mg by mouth every 6 (six) hours as needed for mild pain, moderate pain or headache.   . Blood Glucose Monitoring Suppl (TRUE METRIX METER) w/Device KIT With supplies, alcohol swabs as covered by insurance  . Carboxymeth-Glycerin-Polysorb 0.5-1-0.5 % SOLN Place 1 drop into both eyes 4 (four) times daily as needed (for dry eye relief).   . clindamycin (CLEOCIN) 300 MG capsule Take 1 capsule (300 mg total) by mouth 4 (four) times daily.  [name] donepezil (ARICEPT) 10 MG tablet TAKE 1 TABLET AT BEDTIME  . FEROSUL 325 (65 Fe) MG tablet TAKE 1 TABLET TWICE DAILY  . glipiZIDE (GLUCOTROL XL) 10 MG 24 hr tablet Take 1 tablet (10 mg total) by mouth daily with breakfast.  . glucose blood (TRUE METRIX BLOOD GLUCOSE TEST) test strip TEST ONCE DAILY dx e11.9  . Insulin Glargine (LANTUS SOLOSTAR) 100 UNIT/ML Solostar Pen Inject 10 Units into the skin every morning. HOLD for blood sugar levels below 100  . Insulin Pen Needle (PEN NEEDLES) 31G X 5 MM MISC 1 each by Does not apply route as directed. Use to inject lantus daily dx E11.65  . Insulin Pen Needle 29G X 5MM MISC 1 each by Does not apply route as directed.  . loratadine (CLARITIN) 10 MG tablet Take 10 mg by mouth every morning.   . lovastatin (MEVACOR) 40 MG tablet TAKE 1 TABLET AT BEDTIME  . metoprolol tartrate (LOPRESSOR) 25 MG tablet Take 1 tablet (25 mg total) by mouth 2 (two) times daily.  [name] nystatin (MYCOSTATIN) 100000 UNIT/ML suspension Take 5 mLs by mouth 4 (four) times daily. From  09/27/2018-09/29/2018  . omeprazole (PRILOSEC) 40 MG capsule TAKE 1 CAPSULE EVERY DAY  . sertraline (ZOLOFT) 50 MG tablet TAKE 1 TABLET EVERY DAY  . tamsulosin (FLOMAX) 0.4 MG CAPS capsule TAKE 1 CAPSULE EVERY DAY  . traMADol (ULTRAM) 50 MG tablet Take 1 tablet (50 mg total) by mouth every 12 (twelve) hours as needed for moderate pain.  . Trospium Chloride 60 MG CP24 Take 60 mg by mouth daily.   . Vitamin D, Ergocalciferol, (DRISDOL) 50000 units CAPS capsule Take 1 capsule (50,000 Units total) by mouth every 7 (seven) days.   No facility-administered encounter medications on file as of 09/28/2018.      Review of Systems  Review of Systems  Constitutional: Negative for activity change, appetite change, chills, diaphoresis, fatigue and fever.  HENT: Negative for mouth sores, postnasal drip, rhinorrhea, sinus pain and sore throat.   Respiratory: Negative for apnea, cough, chest tightness, shortness of breath and wheezing.   Cardiovascular: Negative for chest pain, palpitations and leg swelling.  Gastrointestinal: Negative for abdominal distention, abdominal pain, constipation, diarrhea, nausea and vomiting.  Genitourinary: Negative for dysuria and frequency.  Musculoskeletal: Negative for arthralgias, joint swelling and myalgias.  Skin: Negative for rash.  Neurological: Negative for dizziness, syncope, weakness, light-headedness and numbness.  Psychiatric/Behavioral: Negative for behavioral problems, confusion and sleep disturbance.     Vitals:   09/28/18 1451  BP: 103/61  Pulse: (!) 58  Resp: 20  Temp: 98.2 F (36.8 C)  TempSrc: Oral   There is no height or weight on file to calculate BMI. Physical Exam  Constitutional: He appears well-developed and well-nourished.  HENT:  Head: Normocephalic.  Mouth/Throat: Oropharynx is clear and moist.  Eyes: Pupils are equal, round, and reactive to light.  Neck: Neck supple.  Cardiovascular: Normal rate. An irregular rhythm present.    Pulmonary/Chest: Effort normal and breath sounds normal. No stridor. No  respiratory distress. He has no wheezes.  Abdominal: Soft. Bowel sounds are normal. He exhibits no distension. There is no tenderness. There is no guarding.  Musculoskeletal:  Mild Edema Bilateral  Neurological: He is alert.  Didn't know the Year or Month. Got confused  name the place. Knew his DOB and Age No Focal Deficits. Follows all commands  Skin: Skin is warm and dry.  Psychiatric: He has a normal mood and affect. His behavior is normal. Thought content normal.    Labs reviewed: Basic Metabolic Panel: Recent Labs    08/11/18 0912  09/24/18 0603 09/25/18 0605 09/26/18 0517  NA 139   < > 135 135 136  K 4.4   < > 4.7 4.5 4.8  CL 106   < > 104 104 105  CO2 26   < > _0 GLUCOSE 119*   < > 154* 148* 133*  BUN 19   < > 20 21 24*  CREATININE 1.68*   < > 1.74* 1.83* 1.80*  CALCIUM 8.2*   < > 8.0* 8.1* 8.0*  MG 2.1  --  2.3 2.2  --    < > = values in this interval not displayed.   Liver Function Tests: Recent Labs    08/10/18 0703 09/21/18 1914 09/22/18 0643  AST _1 ALT _2 ALKPHOS 54 65 64  BILITOT 0.7 0.6 0.8  PROT 6.9 7.2 7.6  ALBUMIN 2.9* 2.8* 2.9*   No results for input(s): LIPASE, AMYLASE in the last 8760 hours. Recent Labs    09/22/18 0953  AMMONIA 10   CBC: Recent Labs    08/10/18 0704 08/11/18 0912 08/17/18 0700 09/21/18 1914 09/22/18 0643  WBC 5.0 3.7* 4.3 5.5 5.2  NEUTROABS 2.6 1.5*  --  3.1  --   HGB 11.1* 10.9* 11.0* 8.7* 10.6*  HCT 33.7* 32.7* 34.1* 27.8* 34.3*  MCV 93.1 93.2 94.7 97.5 95.0  PLT 202 210 254 264 246   Cardiac Enzymes: Recent Labs    08/04/18 0439  09/07/18 1133  09/22/18 0021 09/22/18 0643 09/22/18 0953 09/22/18 1217  CKTOTAL 924*  --  65  --   --   --  61  --   TROPONINI  --    < >  --    < > <0.03 <0.03  --  <0.03   < > = values in this interval not displayed.   BNP: Invalid input(s): POCBNP Lab Results  Component  Value Date   HGBA1C 8.0 (H) 08/03/2018   Lab Results  Component Value Date   TSH 0.441 09/22/2018   Lab Results  Component Value Date   ACZYSAYT01 601 09/22/2018   Lab Results  Component Value Date   FOLATE 11.7 09/22/2018   Lab Results  Component Value Date   IRON 47 09/22/2018   TIBC 263 09/22/2018   FERRITIN 592 (H) 09/22/2018    Imaging and Procedures obtained prior to SNF admission: No results found.  Assessment/Plan  Aspiration Pneumonia On Clindamycin Will start him on Probiotics. Taper Oxygen. He is on PRN oxygen at home. Lethargy D/W son  Now back to baseline  Paroxysmal PSVT Patient was started on Lopressor in the hospital Repeat EKG in facility shows Normal Sinus rhythm with RBB Echo Showed EF of 60% CKD Creat Slightly higher Will Follow H/O DVT Dopplers of LE was negative for any DVT Diabetes type 2 On Lantus And Glipizide BS doing well Continue Accu Checks in Facility Anemia Most  likely Chronic Disese Iron stores are good. Hyperlipidemia On Statin Dementia with Depression Continue on Aricept and Sertaline.   Family/ staff Communication:   Labs/tests ordered: Total time spent in this patient care encounter was 45_ minutes; greater than 50% of the visit spent counseling patient and son, reviewing records , Labs and coordinating care for problems addressed at this encounter.

## 2018-10-03 ENCOUNTER — Other Ambulatory Visit: Payer: Self-pay

## 2018-10-03 MED ORDER — TRAMADOL HCL 50 MG PO TABS: 50.0000 mg | ORAL_TABLET | Freq: Two times a day (BID) | ORAL | 0 refills | Status: DC | PRN

## 2018-10-03 NOTE — Telephone Encounter (Signed)
RX Fax for [facility]@ [phone]  

## 2018-10-05 ENCOUNTER — Encounter (HOSPITAL_COMMUNITY)
Admission: RE | Admit: 2018-10-05 | Discharge: 2018-10-05 | Disposition: A | Discharge status: Home / Self Care | Payer: Medicare HMO | Source: Skilled Nursing Facility | Attending: Internal Medicine | Admitting: Internal Medicine

## 2018-10-05 LAB — BASIC METABOLIC PANEL
Anion gap: 6 (ref 5–15)
BUN: 20 mg/dL (ref 8–23)
CHLORIDE: 106 mmol/L (ref 98–111)
CO2: 25 mmol/L (ref 22–32)
Calcium: 8.5 mg/dL — ABNORMAL LOW (ref 8.9–10.3)
Creatinine, Ser: 1.69 mg/dL — ABNORMAL HIGH (ref 0.61–1.24)
GFR, EST AFRICAN AMERICAN: 41 mL/min — AB (ref >60)
GFR, EST NON AFRICAN AMERICAN: 35 mL/min — AB (ref >60)
Glucose, Bld: 145 mg/dL — ABNORMAL HIGH (ref 70–99)
POTASSIUM: 4.9 mmol/L (ref 3.5–5.1)
SODIUM: 137 mmol/L (ref 135–145)

## 2018-10-05 LAB — CBC
HCT: 33.3 % — ABNORMAL LOW (ref 39.0–52.0)
HEMOGLOBIN: 10.3 g/dL — AB (ref 13.0–17.0)
MCH: 30.2 pg (ref 26.0–34.0)
MCHC: 30.9 g/dL (ref 30.0–36.0)
MCV: 97.7 fL (ref 80.0–100.0)
Platelets: 270 10*3/uL (ref 150–400)
RBC: 3.41 MIL/uL — AB (ref 4.22–5.81)
RDW: 13.6 % (ref 11.5–15.5)
WBC: 5.2 10*3/uL (ref 4.0–10.5)
nRBC: 0 % (ref 0.0–0.2)

## 2018-10-06 ENCOUNTER — Encounter: Payer: Self-pay | Admitting: Internal Medicine

## 2018-10-06 ENCOUNTER — Non-Acute Institutional Stay (SKILLED_NURSING_FACILITY): Payer: Medicare HMO | Admitting: Internal Medicine

## 2018-10-06 DIAGNOSIS — E119 Type 2 diabetes mellitus without complications: Secondary | ICD-10-CM

## 2018-10-06 DIAGNOSIS — J690 Pneumonitis due to inhalation of food and vomit: Secondary | ICD-10-CM

## 2018-10-06 DIAGNOSIS — Z794 Long term (current) use of insulin: Secondary | ICD-10-CM

## 2018-10-06 DIAGNOSIS — IMO0001 Reserved for inherently not codable concepts without codable children: Secondary | ICD-10-CM

## 2018-10-06 DIAGNOSIS — N183 Chronic kidney disease, stage 3 unspecified: Secondary | ICD-10-CM

## 2018-10-06 DIAGNOSIS — I471 Supraventricular tachycardia: Secondary | ICD-10-CM | POA: Diagnosis not present

## 2018-10-06 DIAGNOSIS — D649 Anemia, unspecified: Secondary | ICD-10-CM | POA: Diagnosis not present

## 2018-10-06 NOTE — Progress Notes (Signed)
Location:    [facility] Room Number: 136/P Place of Service:  SNF [phone])  Provider: Granville Lewis PA-C  PCP: [name], MD Patient Care Team: [name], MD as PCP - General (Family Medicine) Alyson Ingles [name], MD as Consulting Physician (Urology)  Extended Emergency Contact Information Primary Emergency Contact: [facility] Address: [place], [place] [address] Johnnette Litter of [place] Phone: [phone] Relation: Son  Code Status: DNR Goals of care:  Advanced Directive information Advanced Directives 10/06/2018  Does Patient Have a Medical Advance Directive? Yes  Type of Advance Directive Out of facility DNR (pink MOST or yellow form)  Does patient want to make changes to medical advance directive? No - Patient declined  Copy of Eddy in Chart? No - copy requested  Would patient like information on creating a medical advance directive? -  Pre-existing out of facility DNR order (yellow form or pink MOST form) -     Allergies  Allergen Reactions  . Zosyn [Piperacillin Sod-Tazobactam So] Anaphylaxis, Swelling and Other (See Comments)    Facial swelling Has patient had a PCN reaction causing immediate rash, facial/tongue/throat swelling, SOB or lightheadedness with hypotension: Yes Has patient had a PCN reaction causing severe rash involving mucus membranes or skin necrosis: No Has patient had a PCN reaction that required hospitalization: No Has patient had a PCN reaction occurring within the last 10 years: Yes If all of the above answers are "NO", then may proceed with Cephalosporin use.   . Ace Inhibitors Cough  . Vancomycin Swelling and Other (See Comments)    Facial swelling    Chief Complaint  Patient presents with  . Discharge Note    Discharge Visit    HPI:  84 y.o. male seen today for discharge from facility this weekend.  Patient was recently admitted to the  hospital in late October for lethargy and hypoxia- he also has a history of type 2 diabetes in addition to chronic kidney disease hyperlipidemia urinary incontinence anemia dementia and obesity.  Patient lives with his son who was brought to the hospital for lethargy and increased confusion-he was found to be hypoxic as well.  CT scan in hospital did show a small upper lobe infiltrate he was treated for aspiration pneumonia and was discharged to skilled nursing on clindamycin which she is completed.  Was also started on Lopressor for tachycardia- feet VQ scan was negative for any pulmonary embolism.  He was here essentially for strengthening  and completing his antibiotics which he has done-he appears to have done well although still has some weakness.  He does have a very supportive son who he lives with- there also will be hired help at home.  He will need continued PT and OT as well as nursing to help with his multiple medical issues.  Vital signs appear to be stable he is looking very much forward to going home.  Regards to his other medical issues he is a type II diabetic on Lantus 10 units a day as well as glipizide- blood sugars appear to be in the mid higher 100s in the morning sometimes going in the 200s later in the day- hemoglobin A1c was 8 in the hospital- I suspect loose control is somewhat desired with his relatively advanced age and comorbidities.  Regards to dementia appears to do well with supportive care again he does have a very supportive son who  is in the room with him every time I am there-he continues on Aricept is also on Zoloft for coexistent depression.  Heart rate appears to be controlled on the Lopressor.  Regards to chronic kidney disease this appears to be relatively stable it was 1.69 creatinine on lab done yesterday   Past Medical History:  Diagnosis Date  . Acute cholecystitis 11/2012   Drained percutaneously  . Anxiety   . BPH (benign prostatic  hypertrophy)   . Deep venous thrombosis (HCC)    Left leg, diagnosed 7/12  . Dementia [facility])    Needs supervision for safety per son - POA  . Diverticulitis   . Essential hypertension, benign   . GERD (gastroesophageal reflux disease)   . History of pneumonia    Prior to 2012  . Incontinence of urine   . Mixed hyperlipidemia   . Osteoarthritis   . Pulmonary embolism ([place])    Diagnosed 7/12  . Type 2 diabetes mellitus ([place])     Past Surgical History:  Procedure Laterality Date  . CARPAL TUNNEL RELEASE    . COLONOSCOPY  08/31/2012   Procedure: COLONOSCOPY;  Surgeon: Rogene Houston, MD;  Location: [facility];  Service: Endoscopy;  Laterality: N/A;  830  . CYSTOSCOPY WITH INJECTION N/A 05/15/2018   Procedure: CYSTOSCOPY WITH BOTOX INJECTION;  Surgeon: Cleon Gustin, MD;  Location: [facility] ORS;  Service: Urology;  Laterality: N/A;  . CYSTOSCOPY WITH INSERTION OF UROLIFT N/A 09/02/2016   Procedure: CYSTOSCOPY WITH INSERTION OF UROLIFT;  Surgeon: Cleon Gustin, MD;  Location: [facility];  Service: Urology;  Laterality: N/A;  . EYE SURGERY Bilateral    bilateral cataract  . Right total knee replacement  04/23/2011  . SPINE SURGERY  prior to 2012   Neck fusion  . UMBILICAL HERNIA REPAIR     umbilical      reports that he has never smoked. He has never used smokeless tobacco. He reports that he does not drink alcohol or use drugs. Social History   Socioeconomic History  . Marital status: Widowed    Spouse name: Not on file  . Number of children: Not on file  . Years of education: Not on file  . Highest education level: Not on file  Occupational History  . Not on file  Social Needs  . Financial resource strain: Not on file  . Food insecurity:    Worry: Not on file    Inability: Not on file  . Transportation needs:    Medical: Not on file    Non-medical: Not on file  Tobacco Use  . Smoking status: Never Smoker  . Smokeless tobacco: Never Used    Substance and Sexual Activity  . Alcohol use: No  . Drug use: No  . Sexual activity: Never  Lifestyle  . Physical activity:    Days per week: Not on file    Minutes per session: Not on file  . Stress: Not on file  Relationships  . Social connections:    Talks on phone: Not on file    Gets together: Not on file    Attends religious service: Not on file    Active member of club or organization: Not on file    Attends meetings of clubs or organizations: Not on file    Relationship status: Not on file  . Intimate partner violence:    Fear of current or ex partner: Not on file    Emotionally abused: Not on  file    Physically abused: Not on file    Forced sexual activity: Not on file  Other Topics Concern  . Not on file  Social History Narrative  . Not on file   Functional Status Survey:    Allergies  Allergen Reactions  . Zosyn [Piperacillin Sod-Tazobactam So] Anaphylaxis, Swelling and Other (See Comments)    Facial swelling Has patient had a PCN reaction causing immediate rash, facial/tongue/throat swelling, SOB or lightheadedness with hypotension: Yes Has patient had a PCN reaction causing severe rash involving mucus membranes or skin necrosis: No Has patient had a PCN reaction that required hospitalization: No Has patient had a PCN reaction occurring within the last 10 years: Yes If all of the above answers are "NO", then may proceed with Cephalosporin use.   . Ace Inhibitors Cough  . Vancomycin Swelling and Other (See Comments)    Facial swelling    Pertinent  Health Maintenance Due  Topic Date Due  . OPHTHALMOLOGY EXAM  10/29/2018 (Originally 09/21/2016)  . URINE MICROALBUMIN  10/29/2018 (Originally 02/24/2018)  . FOOT EXAM  01/28/2019  . HEMOGLOBIN A1C  02/01/2019  . INFLUENZA VACCINE  Completed  . PNA vac Low Risk Adult  Completed    Medications: Outpatient Encounter Medications as of 10/06/2018  Medication Sig  . acetaminophen (TYLENOL) 500 MG tablet Take  1,000 mg by mouth every 6 (six) hours as needed for mild pain, moderate pain or headache.   . Blood Glucose Monitoring Suppl (TRUE METRIX METER) w/Device KIT With supplies, alcohol swabs as covered by insurance  . Carboxymeth-Glycerin-Polysorb 0.5-1-0.5 % SOLN Place 1 drop into both eyes 4 (four) times daily as needed (for dry eye relief).   [name] donepezil (ARICEPT) 10 MG tablet TAKE 1 TABLET AT BEDTIME  . Ferrous Sulfate (IRON) 325 (65 Fe) MG TABS Take 1 tablet by mouth once a day  . glipiZIDE (GLUCOTROL XL) 10 MG 24 hr tablet Take 1 tablet (10 mg total) by mouth daily with breakfast.  . glucose blood (TRUE METRIX BLOOD GLUCOSE TEST) test strip TEST ONCE DAILY dx e11.9  . Insulin Glargine (LANTUS SOLOSTAR) 100 UNIT/ML Solostar Pen Inject 10 Units into the skin every morning. HOLD for blood sugar levels below 100  . Insulin Pen Needle (PEN NEEDLES) 31G X 5 MM MISC 1 each by Does not apply route as directed. Use to inject lantus daily dx E11.65  . Insulin Pen Needle 29G X 5MM MISC 1 each by Does not apply route as directed.  . loratadine (CLARITIN) 10 MG tablet Take 10 mg by mouth every morning.   . lovastatin (MEVACOR) 40 MG tablet TAKE 1 TABLET AT BEDTIME  . metoprolol tartrate (LOPRESSOR) 25 MG tablet Take 1 tablet (25 mg total) by mouth 2 (two) times daily.  [name] omeprazole (PRILOSEC) 40 MG capsule TAKE 1 CAPSULE EVERY DAY  . sertraline (ZOLOFT) 50 MG tablet TAKE 1 TABLET EVERY DAY  . tamsulosin (FLOMAX) 0.4 MG CAPS capsule TAKE 1 CAPSULE EVERY DAY  . traMADol (ULTRAM) 50 MG tablet Take 1 tablet (50 mg total) by mouth every 12 (twelve) hours as needed for moderate pain.  . Trospium Chloride 60 MG CP24 Take 60 mg by mouth daily.   . Vitamin D, Ergocalciferol, (DRISDOL) 50000 units CAPS capsule Take 1 capsule (50,000 Units total) by mouth every 7 (seven) days.  . [DISCONTINUED] clindamycin (CLEOCIN) 300 MG capsule Take 1 capsule (300 mg total) by mouth 4 (four) times daily.  . [DISCONTINUED] FEROSUL  325 (65  Fe) MG tablet TAKE 1 TABLET TWICE DAILY  . [DISCONTINUED] nystatin (MYCOSTATIN) 100000 UNIT/ML suspension Take 5 mLs by mouth 4 (four) times daily. From 09/27/2018-09/29/2018   No facility-administered encounter medications on file as of 10/06/2018.      Review of Systems   This is somewhat limited secondary to dementia.  In general is not complaining any fever chills looking forward to going home.  Skin does not complain of rashes or itching.  Head ears eyes nose mouth and throat is not complain of any sore throat or visual changes.  Respiratory does not complain of shortness of breath or cough at this time has completed treatment for pneumonia.  Cardiac does not complain of chest pain appears to have his chronic lower extremity edema at baseline.  GI is not complaining of abdominal pain nausea vomiting diarrhea constipation.  GU does not complain of dysuria.  Musculoskeletal continues to have some weakness especially the left leg with stiffness but does not complain of joint pain at this time.  Neurologic does not complain of dizziness headache or syncope.  And psych does have a history of dementia with depression but this appears stable he is looking very much forward to going home    Vitals:   10/06/18 1222  BP: (!) 113/58  Pulse: 62  Resp: 18  Temp: (!) 97.4 F (36.3 C)  TempSrc: Oral    Physical Exam   In general this is a very pleasant elderly male no distress sitting comfortably in his chair.  His skin is warm and dry.  Eyes sclera and conjunctive are clear visual acuity appears to be intact.  Oropharynx appears to have some residue on his tongue this could be food residue mucous membranes are moist.  Chest has very minimal slight rhonchi-wheeze on expiration otherwise clear with no labored breathing  Heart distant heart sounds sounded largely regularshe has baseline lower extremity venous stasis edema  Abdomen is obese soft nontender with  positive bowel sounds.  Moves all extremities x4 continues with some left leg weakness and stiffness and would benefit from continued therapy.  Neurologic is grossly intact his speech is clear no lateralizing findings.  Psych he is oriented to self he is pleasant appropriate can carry on a short conversation.  All right I might like it delayed if I finish this note the okay I will get out to get out of 5 thank you  Labs reviewed: Basic Metabolic Panel: Recent Labs    08/11/18 0912  09/24/18 0603 09/25/18 0605 09/26/18 0517 10/05/18 0700  NA 139   < > 135 135 136 137  K 4.4   < > 4.7 4.5 4.8 4.9  CL 106   < > 104 104 105 106  CO2 26   < > '24 23 24 25  ' GLUCOSE 119*   < > 154* 148* 133* 145*  BUN 19   < > 20 21 24* 20  CREATININE 1.68*   < > 1.74* 1.83* 1.80* 1.69*  CALCIUM 8.2*   < > 8.0* 8.1* 8.0* 8.5*  MG 2.1  --  2.3 2.2  --   --    < > = values in this interval not displayed.   Liver Function Tests: Recent Labs    08/10/18 0703 09/21/18 1914 09/22/18 0643  AST '19 16 17  ' ALT '17 13 14  ' ALKPHOS 54 65 64  BILITOT 0.7 0.6 0.8  PROT 6.9 7.2 7.6  ALBUMIN 2.9* 2.8* 2.9*   No results for  input(s): LIPASE, AMYLASE in the last 8760 hours. Recent Labs    09/22/18 0953  AMMONIA 10   CBC: Recent Labs    08/10/18 0704 08/11/18 0912  09/21/18 1914 09/22/18 0643 10/05/18 0700  WBC 5.0 3.7*   < > 5.5 5.2 5.2  NEUTROABS 2.6 1.5*  --  3.1  --   --   HGB 11.1* 10.9*   < > 8.7* 10.6* 10.3*  HCT 33.7* 32.7*   < > 27.8* 34.3* 33.3*  MCV 93.1 93.2   < > 97.5 95.0 97.7  PLT 202 210   < > 264 246 270   < > = values in this interval not displayed.   Cardiac Enzymes: Recent Labs    08/04/18 0439  09/07/18 1133  09/22/18 0021 09/22/18 0643 09/22/18 0953 09/22/18 1217  CKTOTAL 924*  --  65  --   --   --  61  --   TROPONINI  --    < >  --    < > <0.03 <0.03  --  <0.03   < > = values in this interval not displayed.   BNP: Invalid input(s): POCBNP CBG: Recent Labs     09/25/18 2147 09/26/18 0811 09/26/18 1210  GLUCAP 165* 112* 163*    Procedures and Imaging Studies During Stay: Dg Chest 2 View  Result Date: 09/21/2018 CLINICAL DATA:  Weakness and confusion. EXAM: CHEST - 2 VIEW COMPARISON:  August 11, 2018 FINDINGS: Stable bibasilar changes, likely scar atelectasis. Stable cardiomegaly. No change in the mediastinum. No pneumothorax. IMPRESSION: Chronic bibasilar atelectasis or scar.  No acute change. Electronically Signed   By: Dorise Bullion III M.D   On: 09/21/2018 20:08   Ct Head Wo Contrast  Result Date: 09/21/2018 CLINICAL DATA:  Five-day history of confusion. EXAM: CT HEAD WITHOUT CONTRAST TECHNIQUE: Contiguous axial images were obtained from the base of the skull through the vertex without intravenous contrast. COMPARISON:  Head CT 08/03/2018 FINDINGS: Brain: Stable age related cerebral atrophy, ventriculomegaly and periventricular white matter disease. No extra-axial fluid collections are identified. No CT findings for acute hemispheric infarction or intracranial hemorrhage. No mass lesions. Stable bilateral basal ganglia calcifications. The brainstem and cerebellum are normal. Vascular: No hyperdense vessel or unexpected calcification. Skull: No skull fracture or bone lesion. Stable hyperostosis frontalis interna. Sinuses/Orbits: The paranasal sinuses and mastoid air cells are grossly clear. The globes are intact. Other: No scalp lesions or hematoma. IMPRESSION: 1. Stable cerebral atrophy, ventriculomegaly and periventricular white matter disease. 2. No new/acute intracranial findings. Electronically Signed   By: Marijo Sanes M.D.   On: 09/21/2018 20:38   Ct Chest Wo Contrast  Result Date: 09/23/2018 CLINICAL DATA:  Acute respiratory failure, hypoxia EXAM: CT CHEST WITHOUT CONTRAST TECHNIQUE: Multidetector CT imaging of the chest was performed following the standard protocol without IV contrast. COMPARISON:  Chest radiographs dated 09/21/2018.  CTA chest dated 08/09/2018. FINDINGS: Cardiovascular: The heart is top-normal in size. No pericardial effusion. No evidence of thoracic aortic aneurysm. Atherosclerotic calcifications of the aortic arch. Mild coronary atherosclerosis of the LAD. Mediastinum/Nodes: Small mediastinal lymph nodes, including an 11 mm short axis AP window node, likely reactive. Left thyroid is enlarged/nodular, within aggregate 3.1 cm left thyroid nodule (series 2/image 26), unchanged from 2015. Lungs/Pleura: Evaluation of the lung parenchyma is constrained by respiratory motion. Scarring in the right middle lobe, lingula, and bilateral lower lobes, chronic. Mild patchy/ground-glass opacities in the medial left upper lobe (series 4/image 45) and lateral left upper lobe/lingula (series  4/image 59), suggesting mild infection. 5 mm triangular nodule in the right upper and middle lobes (series 4/images 71 and 82), unchanged from 2015, benign. Suspected mild centrilobular emphysematous changes. No pleural effusion or pneumothorax. Upper Abdomen: Visualized upper abdomen is grossly unremarkable. Musculoskeletal: Degenerative changes of the visualized thoracolumbar spine. Cervical spine fixation hardware, incompletely visualized. IMPRESSION: Mild patchy/ground-glass opacities in the left upper lobe/lingula, suggesting mild infection. Underlying chronic scarring in the right middle lobe, lingula, and bilateral lower lobes. Aortic Atherosclerosis (ICD10-I70.0) and Emphysema (ICD10-J43.9). Electronically Signed   By: Julian Hy M.D.   On: 09/23/2018 16:32   US Carotid Bilateral  Result Date: 09/23/2018 CLINICAL DATA:  Somnolence and encephalopathy. History of dementia, hypertension, hyperlipidemia and diabetes. EXAM: BILATERAL CAROTID DUPLEX ULTRASOUND TECHNIQUE: Pearline Cables scale imaging, color Doppler and duplex ultrasound were performed of bilateral carotid and vertebral arteries in the neck. COMPARISON:  None. FINDINGS: Criteria:  Quantification of carotid stenosis is based on velocity parameters that correlate the residual internal carotid diameter with NASCET-based stenosis levels, using the diameter of the distal internal carotid lumen as the denominator for stenosis measurement. The following velocity measurements were obtained: RIGHT ICA:  75/25 cm/sec CCA:  03/47 cm/sec SYSTOLIC ICA/CCA RATIO:  1.0 ECA:  84 cm/sec LEFT ICA:  64/21 cm/sec CCA:  42/59 cm/sec SYSTOLIC ICA/CCA RATIO:  0.7 ECA:  94 cm/sec RIGHT CAROTID ARTERY: Minimal calcified plaque in the common carotid artery. Moderate eccentric plaque in the carotid bulb. Mild calcified plaque at the right ICA origin. Velocities and waveforms are normal and estimated right ICA stenosis is less than 50%. RIGHT VERTEBRAL ARTERY: Limited ability to visualize the vertebral artery due to patient positioning. Sampling was therefore limited. There appears to be likely antegrade flow. LEFT CAROTID ARTERY: There is a mild amount of calcified plaque at the level of the left carotid bulb and proximal left ICA. Estimated left ICA stenosis is less than 50%. LEFT VERTEBRAL ARTERY: The left vertebral artery was not visualized, likely mostly due to patient positioning. Left vertebral artery occlusion cannot be excluded by the current study. IMPRESSION: 1. Plaque at the level of both carotid bulbs and proximal internal carotid arteries without significant stenosis identified. Estimated bilateral ICA stenoses are less than 50%. 2. Poor visualization of both vertebral arteries. This is felt to be mostly due to patient positioning. Left vertebral artery occlusion cannot be excluded due to lack of visualization. Electronically Signed   By: Aletta Edouard M.D.   On: 09/23/2018 13:15   Nm Pulmonary Perf And Vent  Result Date: 09/22/2018 CLINICAL DATA:  Shortness of breath, weakness, elevated D-dimer, suspected pulmonary embolism EXAM: NUCLEAR MEDICINE VENTILATION - PERFUSION LUNG SCAN TECHNIQUE:  Ventilation images were obtained in multiple projections using inhaled aerosol Tc-58mDTPA. Perfusion images were obtained in multiple projections after intravenous injection of Tc-927mAA. RADIOPHARMACEUTICALS:  33 mCi of Tc-9973mPA aerosol inhalation and 4.4 mCi Tc99m74m IV COMPARISON:  None Correlation: Chest radiograph 09/21/2018 FINDINGS: Ventilation: Central airway deposition of aerosol. Poor peripheral distribution of aerosol to upper lobes. Diminished ventilation LEFT lower lobe medially. Perfusion: No segmental or subsegmental perfusion defects. Minimal irregularity of perfusion at the posterior RIGHT lung, nonsegmental. IMPRESSION: Very low probability for pulmonary embolism. Electronically Signed   By: MarkLavonia Dana.   On: 09/22/2018 09:54   Us VKoreaous Img Lower Bilateral  Result Date: 09/22/2018 CLINICAL DATA:  Bilateral lower extremity pain. EXAM: BILATERAL LOWER EXTREMITY VENOUS DOPPLER ULTRASOUND TECHNIQUE: Gray-scale sonography with graded compression, as well as color Doppler and duplex  ultrasound were performed to evaluate the lower extremity deep venous systems from the level of the common femoral vein and including the common femoral, femoral, profunda femoral, popliteal and calf veins including the posterior tibial, peroneal and gastrocnemius veins when visible. The superficial great saphenous vein was also interrogated. Spectral Doppler was utilized to evaluate flow at rest and with distal augmentation maneuvers in the common femoral, femoral and popliteal veins. COMPARISON:  None. FINDINGS: RIGHT LOWER EXTREMITY Common Femoral Vein: No evidence of thrombus. Normal compressibility, respiratory phasicity and response to augmentation. Saphenofemoral Junction: No evidence of thrombus. Normal compressibility and flow on color Doppler imaging. Profunda Femoral Vein: No evidence of thrombus. Normal compressibility and flow on color Doppler imaging. Femoral Vein: No evidence of thrombus.  Normal compressibility, respiratory phasicity and response to augmentation. Popliteal Vein: No evidence of thrombus. Normal compressibility, respiratory phasicity and response to augmentation. Calf Veins: No evidence of thrombus. Normal compressibility and flow on color Doppler imaging. Superficial Great Saphenous Vein: No evidence of thrombus. Normal compressibility. Venous Reflux:  None. Other Findings:  None. LEFT LOWER EXTREMITY Common Femoral Vein: No evidence of thrombus. Normal compressibility, respiratory phasicity and response to augmentation. Saphenofemoral Junction: No evidence of thrombus. Normal compressibility and flow on color Doppler imaging. Profunda Femoral Vein: No evidence of thrombus. Normal compressibility and flow on color Doppler imaging. Femoral Vein: No evidence of thrombus. Normal compressibility, respiratory phasicity and response to augmentation. Popliteal Vein: No evidence of thrombus. Normal compressibility, respiratory phasicity and response to augmentation. Calf Veins: No evidence of thrombus. Normal compressibility and flow on color Doppler imaging. Superficial Great Saphenous Vein: No evidence of thrombus. Normal compressibility. Venous Reflux:  None. Other Findings:  None. IMPRESSION: No evidence of deep venous thrombosis. Electronically Signed   By: Marijo Conception, M.D.   On: 09/22/2018 14:27    Assessment/Plan:   There are no diagnoses linked to this encounter.   Patient is being discharged with the following home health services:    Patient is being discharged with the following durable medical equipment:    Patient has been advised to f/u with their PCP in 1-2 weeks to bring them up to date on their rehab stay.  Social services at facility was responsible for arranging this appointment.  Pt was provided with a 30 day supply of prescriptions for medications and refills must be obtained from their PCP.  For controlled substances, a more limited supply may be provided  adequate until PCP appointment   Assessment and plan.  1.  History of lethargy with hypoxia with suspected aspiration pneumonia he is completed clindamycin -- denies any shortness of breath- lung exam was fairly benign--will need follow-up by primary care provider.  2.  Type 2 diabete morning blood sugars appear to be more mid to higher 100s and this is a case at noon as well a bit higher later in the day SPECT somewhat loose control as desired continues on Lantus and glipizide.  3.  History of anemia most likely chronic hemoglobin is been stable at 10.3 on most recent lab.  4.  History of tachycardia and Lopressor was added in the hospital this appears to be well tolerated rate appears to be controlled blood pressure appears to be relatively stable as well.  5.-  History of hyperlipidemia he is on a statin LDL 5.  6.  History of dementia he appears to do well with supportive care he is on Aricept on Zoloft for coexistent depression- he has wonderful support from his  son at home he also will have hired help when he goes home has all his DME at home.  .  7.  History of urinary incontinence continues on Cloyd Stagers is apparently is not been a problem during his stay here.  8.  History of chronic kidney disease this is been stable with a creatinine of 1.69 on yesterday's lab again followed by primary care provider as needed.  Again he will be going home with his son who is very supportive will have hired help at home has all DME at home will need continued PT and OT for strengthening as well as nursing support for his multiple medical issues  [phone] note greater than 30 minutes spent on this discharge summary-greater than 50% of time spent coordinating a plan of care for numerous diagnoses

## 2018-10-08 DIAGNOSIS — W19XXXD Unspecified fall, subsequent encounter: Secondary | ICD-10-CM | POA: Diagnosis not present

## 2018-10-08 DIAGNOSIS — N183 Chronic kidney disease, stage 3 (moderate): Secondary | ICD-10-CM | POA: Diagnosis not present

## 2018-10-08 DIAGNOSIS — N400 Enlarged prostate without lower urinary tract symptoms: Secondary | ICD-10-CM | POA: Diagnosis not present

## 2018-10-08 DIAGNOSIS — F0280 Dementia in other diseases classified elsewhere without behavioral disturbance: Secondary | ICD-10-CM | POA: Diagnosis not present

## 2018-10-08 DIAGNOSIS — M6282 Rhabdomyolysis: Secondary | ICD-10-CM | POA: Diagnosis not present

## 2018-10-08 DIAGNOSIS — I129 Hypertensive chronic kidney disease with stage 1 through stage 4 chronic kidney disease, or unspecified chronic kidney disease: Secondary | ICD-10-CM | POA: Diagnosis not present

## 2018-10-08 DIAGNOSIS — E1122 Type 2 diabetes mellitus with diabetic chronic kidney disease: Secondary | ICD-10-CM | POA: Diagnosis not present

## 2018-10-08 DIAGNOSIS — G309 Alzheimer's disease, unspecified: Secondary | ICD-10-CM | POA: Diagnosis not present

## 2018-10-08 DIAGNOSIS — M1991 Primary osteoarthritis, unspecified site: Secondary | ICD-10-CM | POA: Diagnosis not present

## 2018-10-09 ENCOUNTER — Telehealth: Payer: Self-pay | Admitting: *Deleted

## 2018-10-09 ENCOUNTER — Encounter: Payer: Self-pay | Admitting: Family Medicine

## 2018-10-09 DIAGNOSIS — W19XXXD Unspecified fall, subsequent encounter: Secondary | ICD-10-CM | POA: Diagnosis not present

## 2018-10-09 DIAGNOSIS — F0280 Dementia in other diseases classified elsewhere without behavioral disturbance: Secondary | ICD-10-CM | POA: Diagnosis not present

## 2018-10-09 DIAGNOSIS — I129 Hypertensive chronic kidney disease with stage 1 through stage 4 chronic kidney disease, or unspecified chronic kidney disease: Secondary | ICD-10-CM | POA: Diagnosis not present

## 2018-10-09 DIAGNOSIS — G309 Alzheimer's disease, unspecified: Secondary | ICD-10-CM | POA: Diagnosis not present

## 2018-10-09 DIAGNOSIS — N183 Chronic kidney disease, stage 3 (moderate): Secondary | ICD-10-CM | POA: Diagnosis not present

## 2018-10-09 DIAGNOSIS — M6282 Rhabdomyolysis: Secondary | ICD-10-CM | POA: Diagnosis not present

## 2018-10-09 DIAGNOSIS — M1991 Primary osteoarthritis, unspecified site: Secondary | ICD-10-CM | POA: Diagnosis not present

## 2018-10-09 DIAGNOSIS — N400 Enlarged prostate without lower urinary tract symptoms: Secondary | ICD-10-CM | POA: Diagnosis not present

## 2018-10-09 DIAGNOSIS — E1122 Type 2 diabetes mellitus with diabetic chronic kidney disease: Secondary | ICD-10-CM | POA: Diagnosis not present

## 2018-10-09 NOTE — Telephone Encounter (Signed)
Verbal order left on voicemail.  

## 2018-10-09 NOTE — Telephone Encounter (Signed)
Samuel Ryan from [employer]  PT needs home health care resumed  Verbal order for skilled nursing two times a week for 2 weeks

## 2018-10-10 ENCOUNTER — Ambulatory Visit (INDEPENDENT_AMBULATORY_CARE_PROVIDER_SITE_OTHER): Payer: Medicare HMO | Admitting: Orthopaedic Surgery

## 2018-10-10 ENCOUNTER — Telehealth: Payer: Self-pay

## 2018-10-10 ENCOUNTER — Encounter: Payer: Self-pay | Admitting: Family Medicine

## 2018-10-10 ENCOUNTER — Encounter (INDEPENDENT_AMBULATORY_CARE_PROVIDER_SITE_OTHER): Payer: Self-pay | Admitting: Orthopaedic Surgery

## 2018-10-10 ENCOUNTER — Ambulatory Visit (INDEPENDENT_AMBULATORY_CARE_PROVIDER_SITE_OTHER): Payer: Medicare HMO | Admitting: Family Medicine

## 2018-10-10 ENCOUNTER — Ambulatory Visit (INDEPENDENT_AMBULATORY_CARE_PROVIDER_SITE_OTHER): Payer: Medicare HMO

## 2018-10-10 VITALS — BP 122/80 | HR 85 | Resp 15 | Ht 70.0 in | Wt 252.0 lb

## 2018-10-10 VITALS — BP 122/80 | HR 85 | Ht 70.0 in | Wt 252.0 lb

## 2018-10-10 DIAGNOSIS — G8929 Other chronic pain: Secondary | ICD-10-CM | POA: Diagnosis not present

## 2018-10-10 DIAGNOSIS — E119 Type 2 diabetes mellitus without complications: Secondary | ICD-10-CM | POA: Diagnosis not present

## 2018-10-10 DIAGNOSIS — Z794 Long term (current) use of insulin: Secondary | ICD-10-CM

## 2018-10-10 DIAGNOSIS — Z09 Encounter for follow-up examination after completed treatment for conditions other than malignant neoplasm: Secondary | ICD-10-CM | POA: Diagnosis not present

## 2018-10-10 DIAGNOSIS — I471 Supraventricular tachycardia: Secondary | ICD-10-CM | POA: Diagnosis not present

## 2018-10-10 DIAGNOSIS — Z96651 Presence of right artificial knee joint: Secondary | ICD-10-CM

## 2018-10-10 DIAGNOSIS — IMO0001 Reserved for inherently not codable concepts without codable children: Secondary | ICD-10-CM

## 2018-10-10 DIAGNOSIS — M1712 Unilateral primary osteoarthritis, left knee: Secondary | ICD-10-CM | POA: Diagnosis not present

## 2018-10-10 DIAGNOSIS — I10 Essential (primary) hypertension: Secondary | ICD-10-CM | POA: Diagnosis not present

## 2018-10-10 DIAGNOSIS — M25562 Pain in left knee: Secondary | ICD-10-CM

## 2018-10-10 MED ORDER — METHYLPREDNISOLONE ACETATE 40 MG/ML IJ SUSP
40.0000 mg | INTRAMUSCULAR | Status: AC | PRN
  Administered 2018-10-10: 40 mg via INTRA_ARTICULAR

## 2018-10-10 MED ORDER — LIDOCAINE HCL 1 % IJ SOLN
0.5000 mL | INTRAMUSCULAR | Status: AC | PRN
  Administered 2018-10-10: .5 mL

## 2018-10-10 MED ORDER — BUPIVACAINE HCL 0.5 % IJ SOLN
3.0000 mL | INTRAMUSCULAR | Status: AC | PRN
  Administered 2018-10-10: 3 mL via INTRA_ARTICULAR

## 2018-10-10 NOTE — Assessment & Plan Note (Signed)
New onset SVT in 08/2018, needs cardiology eval

## 2018-10-10 NOTE — Telephone Encounter (Signed)
Transition Care Management Follow-up Telephone Call   Date discharged?    10-08-18            How have you been since you were released from the hospital? "been feeling ok, knee is bothering me"   Do you understand why you were in the hospital? Yes    Do you understand the discharge instructions? Yes   Where were you discharged to? Discharged first to Nursing home then back to home with son and home health nurses   Items Reviewed:  Medications reviewed:  Yes   Allergies reviewed:  Yes   Dietary changes reviewed: None   Referrals reviewed: Referral to othro, reviewed    Functional Questionnaire:  Activities of Daily Living (ADLs):   Needs assistance with ADLs    Any transportation issues/concerns?:  None has son's support   Any patient concerns?   None   Confirmed importance and date/time of follow-up visits scheduled Follow up with Dr. Moshe Cipro today 10-10-18     Confirmed with patient if condition begins to worsen call PCP or go to the ER.  Patient was given the office number and encouraged to call back with question or concerns.  :

## 2018-10-10 NOTE — Progress Notes (Signed)
   Samuel Ryan     MRN: [id number]      DOB: [birth year]   HPI Mr. Wolz is here for follow up from recent hospitalization new SVT, aspiration pneumonitis from 10/24 to 09/26/2018, and SNF until 10/07/2018 Hospital and SNF course is reviewed.  Medication reconciliation done, new ,medication is started because of SVT. He will also need to be followed by cardiology C./o left knee pain and swelling no trauma ROS Denies recent fever or chills. Denies sinus pressure, nasal congestion, ear pain or sore throat. Denies chest congestion, productive cough or wheezing. Denies chest pains, palpitations and leg swelling Denies abdominal pain, nausea, vomiting,diarrhea or constipation.   Denies dysuria, frequency, hesitancy or incontinence. C/o joint pain, swelling and limitation in mobility.No trauma associated with acute left knee symptom Denies headaches, seizures, numbness, or tingling. Denies depression,   Uncontrolled anxiety or insomnia. Denies skin break down or rash.   PE  BP 122/80   Pulse 85   Resp 15   Ht 5\' 10"  (1.778 m)   Wt 252 lb (114.3 kg)   SpO2 90%   BMI 36.16 kg/m   Patient alert and oriented and in no cardiopulmonary distress.  HEENT: No facial asymmetry, EOMI,   oropharynx pink and moist.  Neck supple no JVD, no mass.  Chest: Clear to auscultation bilaterally.  CVS: S1, S2 no murmurs, no S3.Regular rate.  ABD: Soft non tender.   Ext: No edema  MS: Decreased  ROM spine, shoulders, hips and markedly reduced in left knees.which is swollen and tender  Skin: Intact, no ulcerations or rash noted.  Psych: Good eye contact, normal affect. Memory impaired not anxious or depressed appearing.  CNS: CN 2-12 intact, power,  normal throughout.no focal deficits noted.   Assessment & Plan  Knee pain, left 1 month h/o pain and swelling of left knee , needs ortho asap  SVT (supraventricular tachycardia) ([place]) New onset SVT in 08/2018, needs cardiology eval  Hospital  discharge follow-up Hospital and SNF course reviewed with pt and his son, and medications reviewed. Denies current light headedness or tachycardia, appetite is good and bowel movements and urine flow normal C/o disabling left knee pain and swelling , limiting movement, no trauma, referred to Orthopedics received same day appt

## 2018-10-10 NOTE — Progress Notes (Signed)
Office Visit Note   Patient: Samuel Ryan           Date of Birth: 12/29/1930           MRN: [id number] Visit Date: 10/10/2018              Requested by: [name], [address], [place], [place] [address] PCP: [name], MD   Assessment & Plan: Visit Diagnoses:  1. Acute pain of left knee   2. Unilateral primary osteoarthritis, left knee   3. History of total knee arthroplasty, right   4.     Bilateral lower extremity venous stasis with pitting edema without active ulceration.  Plan: Injection performed left knee.  Son will watch his sugars checking 3 times a day.  They can adjust the insulin up if needed for the first week if he has some hyperglycemia.  There is only been once last several months he is ever been over 200.  Support stockings applied for the bilateral venous stasis changes with pitting edema.  Follow-Up Instructions: No follow-ups on file.   Orders:  Orders Placed This Encounter  Procedures  . XR Knee 1-2 Views Left   No orders of the defined types were placed in this encounter.     Procedures: Large Joint Inj: L knee on 10/10/2018 3:43 PM Indications: joint swelling and pain Details: 22 G 1.5 in needle, anterolateral approach  Arthrogram: No  Medications: 0.5 mL lidocaine 1 %; 3 mL bupivacaine 0.5 %; 40 mg methylPREDNISolone acetate 40 MG/ML Outcome: tolerated well, no immediate complications Procedure, treatment alternatives, risks and benefits explained, specific risks discussed. Consent was given by the patient. Immediately prior to procedure a time out was called to verify the correct patient, procedure, equipment, support staff and site/side marked as required. Patient was prepped and draped in the usual sterile fashion.       Clinical Data: No additional findings.   Subjective: Chief Complaint  Patient presents with  . Left Knee - Pain    HPI 84 year old male fell about a month ago landing on his left  knee and has had pain difficulty ambulating since that time.  Previous history of right total knee arthroplasty.  He ambulates with a walker but has had much more problems since the fall.  He complains of aching pain and stiffness in his left knee.  Previous history of right total knee arthroplasty.  He has problems with dementia, heart failure, insulin-dependent diabetes on 10 units of Lantus every morning stage III kidney disease, history of hypoxia heart disease, atrial fibrillation.  Review of Systems 14 point review of systems positive for history of falls, metabolic encephalopathy, respiratory failure, dementia, type 2 insulin-dependent diabetes, hypertension, goiter, previous right total knee arthroplasty Dr. Wynelle Link.  Left knee osteoarthritis.  Otherwise negative as pertains HPI.   Objective: Vital Signs: BP 122/80   Pulse 85   Ht 5\' 10"  (1.778 m)   Wt 252 lb (114.3 kg)   BMI 36.16 kg/m   Physical Exam  Constitutional: He is oriented to person, place, and time. He appears well-developed and well-nourished.  HENT:  Head: Normocephalic and atraumatic.  Eyes: Pupils are equal, round, and reactive to light. EOM are normal.  Neck: No tracheal deviation present. No thyromegaly present.  Cardiovascular: Normal rate.  Pulmonary/Chest: Effort normal. He has no wheezes.  Abdominal: Soft. Bowel sounds are normal.  Neurological: He is alert and oriented to person, place, and time.  Skin:  Skin is warm and dry. Capillary refill takes less than 2 seconds.  Psychiatric: He has a normal mood and affect.  Patient pleasant answers questions.  Somewhat poor historian.    Ortho Exam bilateral lower extremity pitting edema with venous stasis chronic discoloration both right and left.  Left knee severe crepitus 2+ knee effusion.  Skin is intact anteriorly over the knee after his fall.  He lacks 10 degrees reaching full extension he can flex past 90 degrees.  Medial lateral joint line tenderness.  ACL  PCL is normal.  Right and left foot shows no plantar foot lesions. Specialty Comments:  No specialty comments available.  Imaging: No results found.   PMFS History: Patient Active Problem List   Diagnosis Date Noted  . Knee pain, left 10/10/2018  . History of total knee arthroplasty, right 10/10/2018  . Unilateral primary osteoarthritis, left knee 10/10/2018  . SVT (supraventricular tachycardia) ([place]) 09/25/2018  . Acute metabolic encephalopathy 99/24/2683  . Acute respiratory failure with hypoxia ([place]) 09/22/2018  . Uncontrolled type 2 diabetes mellitus with hyperglycemia ([place]) 09/22/2018  . Dementia without behavioral disturbance ([place]) 09/22/2018  . Fall 08/10/2018  . Rhabdomyolysis 08/03/2018  . Morbid obesity ([place]) 06/12/2018  . Anemia 09/14/2017  . CKD (chronic kidney disease) stage 3, GFR 30-59 ml/min (HCC) 09/14/2017  . Hyponatremia 09/14/2017  . Atrial fibrillation ([place]) 09/14/2017  . Hospital discharge follow-up 11/26/2016  . Hypoalbuminemia 11/23/2016  . Hypoxia 10/29/2016  . Urinary frequency 09/17/2015  . At high risk for falls 03/30/2015  . Generalized osteoarthritis 07/16/2014  . Urinary incontinence 07/16/2014  . Pulmonary interstitial fibrosis ([place]) 07/03/2014  . Abnormal CXR (chest x-ray) 05/27/2014  . Thyroid mass of unclear etiology 05/07/2014  . Vitamin D deficiency 01/31/2014  . Allergic rhinitis 01/31/2014  . Diverticulosis of sigmoid colon 12/13/2012  . Right ventricular dysfunction 07/20/2011  . Heart disease, unspecified 07/20/2011  . Alzheimer's dementia without behavioral disturbance ([place]) 09/12/2010  . Goiter 06/19/2009  . Essential hypertension 12/11/2008  . Diabetes mellitus, insulin dependent (IDDM), controlled ([place]) 09/05/2008  . Hyperlipidemia with target LDL less than 100 07/19/2008  . BPH (benign prostatic hyperplasia) 03/28/2008   Past Medical History:  Diagnosis Date  . Acute cholecystitis 11/2012   Drained percutaneously  .  Anxiety   . BPH (benign prostatic hypertrophy)   . Deep venous thrombosis (HCC)    Left leg, diagnosed 7/12  . Dementia [facility])    Needs supervision for safety per son - POA  . Diverticulitis   . Essential hypertension, benign   . GERD (gastroesophageal reflux disease)   . History of pneumonia    Prior to 2012  . Incontinence of urine   . Mixed hyperlipidemia   . Osteoarthritis   . Pulmonary embolism ([place])    Diagnosed 7/12  . Type 2 diabetes mellitus (HCC)     Family History  Problem Relation Age of Onset  . Cancer Mother        Stomach  . Diabetes Sister   . Hypertension Sister   . Diabetes Brother   . Cancer Brother        Gallbladder  . Seizures Son        due to meningitis as a child    Past Surgical History:  Procedure Laterality Date  . CARPAL TUNNEL RELEASE    . COLONOSCOPY  08/31/2012   Procedure: COLONOSCOPY;  Surgeon: Rogene Houston, MD;  Location: [facility];  Service: Endoscopy;  Laterality: N/A;  830  .  CYSTOSCOPY WITH INJECTION N/A 05/15/2018   Procedure: CYSTOSCOPY WITH BOTOX INJECTION;  Surgeon: Cleon Gustin, MD;  Location: [facility] ORS;  Service: Urology;  Laterality: N/A;  . CYSTOSCOPY WITH INSERTION OF UROLIFT N/A 09/02/2016   Procedure: CYSTOSCOPY WITH INSERTION OF UROLIFT;  Surgeon: Cleon Gustin, MD;  Location: [facility];  Service: Urology;  Laterality: N/A;  . EYE SURGERY Bilateral    bilateral cataract  . Right total knee replacement  04/23/2011  . SPINE SURGERY  prior to 2012   Neck fusion  . UMBILICAL HERNIA REPAIR     umbilical   Social History   Occupational History  . Not on file  Tobacco Use  . Smoking status: Never Smoker  . Smokeless tobacco: Never Used  Substance and Sexual Activity  . Alcohol use: No  . Drug use: No  . Sexual activity: Never

## 2018-10-10 NOTE — Assessment & Plan Note (Signed)
1 month h/o pain and swelling of left knee , needs ortho asap

## 2018-10-10 NOTE — Patient Instructions (Addendum)
Keep appt in December, please call if you need me before  Non fasting HBa1C , chem 7 and EGFr from mid December  You are referred to Orthopedics asap for left knee pain and swelling  You are referred to cardiology due to new onset rapid heart rate

## 2018-10-12 DIAGNOSIS — M6282 Rhabdomyolysis: Secondary | ICD-10-CM | POA: Diagnosis not present

## 2018-10-12 DIAGNOSIS — W19XXXD Unspecified fall, subsequent encounter: Secondary | ICD-10-CM | POA: Diagnosis not present

## 2018-10-12 DIAGNOSIS — M1991 Primary osteoarthritis, unspecified site: Secondary | ICD-10-CM | POA: Diagnosis not present

## 2018-10-12 DIAGNOSIS — G309 Alzheimer's disease, unspecified: Secondary | ICD-10-CM | POA: Diagnosis not present

## 2018-10-12 DIAGNOSIS — N183 Chronic kidney disease, stage 3 (moderate): Secondary | ICD-10-CM | POA: Diagnosis not present

## 2018-10-12 DIAGNOSIS — N400 Enlarged prostate without lower urinary tract symptoms: Secondary | ICD-10-CM | POA: Diagnosis not present

## 2018-10-12 DIAGNOSIS — I129 Hypertensive chronic kidney disease with stage 1 through stage 4 chronic kidney disease, or unspecified chronic kidney disease: Secondary | ICD-10-CM | POA: Diagnosis not present

## 2018-10-12 DIAGNOSIS — E1122 Type 2 diabetes mellitus with diabetic chronic kidney disease: Secondary | ICD-10-CM | POA: Diagnosis not present

## 2018-10-12 DIAGNOSIS — F0280 Dementia in other diseases classified elsewhere without behavioral disturbance: Secondary | ICD-10-CM | POA: Diagnosis not present

## 2018-10-13 DIAGNOSIS — N183 Chronic kidney disease, stage 3 (moderate): Secondary | ICD-10-CM | POA: Diagnosis not present

## 2018-10-13 DIAGNOSIS — G309 Alzheimer's disease, unspecified: Secondary | ICD-10-CM | POA: Diagnosis not present

## 2018-10-13 DIAGNOSIS — F0280 Dementia in other diseases classified elsewhere without behavioral disturbance: Secondary | ICD-10-CM | POA: Diagnosis not present

## 2018-10-13 DIAGNOSIS — N400 Enlarged prostate without lower urinary tract symptoms: Secondary | ICD-10-CM | POA: Diagnosis not present

## 2018-10-13 DIAGNOSIS — W19XXXD Unspecified fall, subsequent encounter: Secondary | ICD-10-CM | POA: Diagnosis not present

## 2018-10-13 DIAGNOSIS — M6282 Rhabdomyolysis: Secondary | ICD-10-CM | POA: Diagnosis not present

## 2018-10-13 DIAGNOSIS — M1991 Primary osteoarthritis, unspecified site: Secondary | ICD-10-CM | POA: Diagnosis not present

## 2018-10-13 DIAGNOSIS — E1122 Type 2 diabetes mellitus with diabetic chronic kidney disease: Secondary | ICD-10-CM | POA: Diagnosis not present

## 2018-10-13 DIAGNOSIS — I129 Hypertensive chronic kidney disease with stage 1 through stage 4 chronic kidney disease, or unspecified chronic kidney disease: Secondary | ICD-10-CM | POA: Diagnosis not present

## 2018-10-15 ENCOUNTER — Encounter: Payer: Self-pay | Admitting: Family Medicine

## 2018-10-15 NOTE — Assessment & Plan Note (Signed)
Hospital and SNF course reviewed with pt and his son, and medications reviewed. Denies current light headedness or tachycardia, appetite is good and bowel movements and urine flow normal C/o disabling left knee pain and swelling , limiting movement, no trauma, referred to Orthopedics received same day appt

## 2018-10-16 DIAGNOSIS — G309 Alzheimer's disease, unspecified: Secondary | ICD-10-CM | POA: Diagnosis not present

## 2018-10-16 DIAGNOSIS — F0280 Dementia in other diseases classified elsewhere without behavioral disturbance: Secondary | ICD-10-CM | POA: Diagnosis not present

## 2018-10-16 DIAGNOSIS — N400 Enlarged prostate without lower urinary tract symptoms: Secondary | ICD-10-CM | POA: Diagnosis not present

## 2018-10-16 DIAGNOSIS — E1122 Type 2 diabetes mellitus with diabetic chronic kidney disease: Secondary | ICD-10-CM | POA: Diagnosis not present

## 2018-10-16 DIAGNOSIS — M1991 Primary osteoarthritis, unspecified site: Secondary | ICD-10-CM | POA: Diagnosis not present

## 2018-10-16 DIAGNOSIS — M6282 Rhabdomyolysis: Secondary | ICD-10-CM | POA: Diagnosis not present

## 2018-10-16 DIAGNOSIS — N183 Chronic kidney disease, stage 3 (moderate): Secondary | ICD-10-CM | POA: Diagnosis not present

## 2018-10-16 DIAGNOSIS — I129 Hypertensive chronic kidney disease with stage 1 through stage 4 chronic kidney disease, or unspecified chronic kidney disease: Secondary | ICD-10-CM | POA: Diagnosis not present

## 2018-10-16 DIAGNOSIS — W19XXXD Unspecified fall, subsequent encounter: Secondary | ICD-10-CM | POA: Diagnosis not present

## 2018-10-17 ENCOUNTER — Other Ambulatory Visit: Payer: Self-pay | Admitting: Family Medicine

## 2018-10-18 ENCOUNTER — Other Ambulatory Visit: Payer: Self-pay | Admitting: Family Medicine

## 2018-10-18 DIAGNOSIS — N400 Enlarged prostate without lower urinary tract symptoms: Secondary | ICD-10-CM | POA: Diagnosis not present

## 2018-10-18 DIAGNOSIS — N183 Chronic kidney disease, stage 3 (moderate): Secondary | ICD-10-CM | POA: Diagnosis not present

## 2018-10-18 DIAGNOSIS — G309 Alzheimer's disease, unspecified: Secondary | ICD-10-CM | POA: Diagnosis not present

## 2018-10-18 DIAGNOSIS — F0280 Dementia in other diseases classified elsewhere without behavioral disturbance: Secondary | ICD-10-CM | POA: Diagnosis not present

## 2018-10-18 DIAGNOSIS — E1122 Type 2 diabetes mellitus with diabetic chronic kidney disease: Secondary | ICD-10-CM | POA: Diagnosis not present

## 2018-10-18 DIAGNOSIS — I129 Hypertensive chronic kidney disease with stage 1 through stage 4 chronic kidney disease, or unspecified chronic kidney disease: Secondary | ICD-10-CM | POA: Diagnosis not present

## 2018-10-18 DIAGNOSIS — M1991 Primary osteoarthritis, unspecified site: Secondary | ICD-10-CM | POA: Diagnosis not present

## 2018-10-18 DIAGNOSIS — M6282 Rhabdomyolysis: Secondary | ICD-10-CM | POA: Diagnosis not present

## 2018-10-18 DIAGNOSIS — W19XXXD Unspecified fall, subsequent encounter: Secondary | ICD-10-CM | POA: Diagnosis not present

## 2018-10-19 DIAGNOSIS — I129 Hypertensive chronic kidney disease with stage 1 through stage 4 chronic kidney disease, or unspecified chronic kidney disease: Secondary | ICD-10-CM | POA: Diagnosis not present

## 2018-10-19 DIAGNOSIS — F0280 Dementia in other diseases classified elsewhere without behavioral disturbance: Secondary | ICD-10-CM | POA: Diagnosis not present

## 2018-10-19 DIAGNOSIS — M1991 Primary osteoarthritis, unspecified site: Secondary | ICD-10-CM | POA: Diagnosis not present

## 2018-10-19 DIAGNOSIS — N400 Enlarged prostate without lower urinary tract symptoms: Secondary | ICD-10-CM | POA: Diagnosis not present

## 2018-10-19 DIAGNOSIS — E1122 Type 2 diabetes mellitus with diabetic chronic kidney disease: Secondary | ICD-10-CM | POA: Diagnosis not present

## 2018-10-19 DIAGNOSIS — N183 Chronic kidney disease, stage 3 (moderate): Secondary | ICD-10-CM | POA: Diagnosis not present

## 2018-10-19 DIAGNOSIS — M6282 Rhabdomyolysis: Secondary | ICD-10-CM | POA: Diagnosis not present

## 2018-10-19 DIAGNOSIS — G309 Alzheimer's disease, unspecified: Secondary | ICD-10-CM | POA: Diagnosis not present

## 2018-10-19 DIAGNOSIS — W19XXXD Unspecified fall, subsequent encounter: Secondary | ICD-10-CM | POA: Diagnosis not present

## 2018-10-20 ENCOUNTER — Ambulatory Visit (INDEPENDENT_AMBULATORY_CARE_PROVIDER_SITE_OTHER): Payer: Medicare HMO | Admitting: Urology

## 2018-10-20 DIAGNOSIS — N183 Chronic kidney disease, stage 3 (moderate): Secondary | ICD-10-CM | POA: Diagnosis not present

## 2018-10-20 DIAGNOSIS — N401 Enlarged prostate with lower urinary tract symptoms: Secondary | ICD-10-CM

## 2018-10-20 DIAGNOSIS — N400 Enlarged prostate without lower urinary tract symptoms: Secondary | ICD-10-CM | POA: Diagnosis not present

## 2018-10-20 DIAGNOSIS — W19XXXD Unspecified fall, subsequent encounter: Secondary | ICD-10-CM | POA: Diagnosis not present

## 2018-10-20 DIAGNOSIS — I129 Hypertensive chronic kidney disease with stage 1 through stage 4 chronic kidney disease, or unspecified chronic kidney disease: Secondary | ICD-10-CM | POA: Diagnosis not present

## 2018-10-20 DIAGNOSIS — R3915 Urgency of urination: Secondary | ICD-10-CM

## 2018-10-20 DIAGNOSIS — F0280 Dementia in other diseases classified elsewhere without behavioral disturbance: Secondary | ICD-10-CM | POA: Diagnosis not present

## 2018-10-20 DIAGNOSIS — G309 Alzheimer's disease, unspecified: Secondary | ICD-10-CM | POA: Diagnosis not present

## 2018-10-20 DIAGNOSIS — M6282 Rhabdomyolysis: Secondary | ICD-10-CM | POA: Diagnosis not present

## 2018-10-20 DIAGNOSIS — E1122 Type 2 diabetes mellitus with diabetic chronic kidney disease: Secondary | ICD-10-CM | POA: Diagnosis not present

## 2018-10-20 DIAGNOSIS — M1991 Primary osteoarthritis, unspecified site: Secondary | ICD-10-CM | POA: Diagnosis not present

## 2018-10-23 ENCOUNTER — Telehealth: Payer: Self-pay | Admitting: *Deleted

## 2018-10-23 NOTE — Telephone Encounter (Signed)
Does Dr. Chauncey Cruel need to order this or can we?

## 2018-10-23 NOTE — Telephone Encounter (Signed)
Allie with Occupational Therapy needs an order for Occupational Therapy 1x a week for 3 weeks can be reached at [phone]

## 2018-10-24 ENCOUNTER — Other Ambulatory Visit: Payer: Self-pay | Admitting: Family Medicine

## 2018-10-24 ENCOUNTER — Telehealth: Payer: Self-pay | Admitting: *Deleted

## 2018-10-24 DIAGNOSIS — F0280 Dementia in other diseases classified elsewhere without behavioral disturbance: Secondary | ICD-10-CM | POA: Diagnosis not present

## 2018-10-24 DIAGNOSIS — N183 Chronic kidney disease, stage 3 (moderate): Secondary | ICD-10-CM | POA: Diagnosis not present

## 2018-10-24 DIAGNOSIS — W19XXXD Unspecified fall, subsequent encounter: Secondary | ICD-10-CM | POA: Diagnosis not present

## 2018-10-24 DIAGNOSIS — E1122 Type 2 diabetes mellitus with diabetic chronic kidney disease: Secondary | ICD-10-CM | POA: Diagnosis not present

## 2018-10-24 DIAGNOSIS — N400 Enlarged prostate without lower urinary tract symptoms: Secondary | ICD-10-CM | POA: Diagnosis not present

## 2018-10-24 DIAGNOSIS — M6282 Rhabdomyolysis: Secondary | ICD-10-CM | POA: Diagnosis not present

## 2018-10-24 DIAGNOSIS — G309 Alzheimer's disease, unspecified: Secondary | ICD-10-CM | POA: Diagnosis not present

## 2018-10-24 DIAGNOSIS — I129 Hypertensive chronic kidney disease with stage 1 through stage 4 chronic kidney disease, or unspecified chronic kidney disease: Secondary | ICD-10-CM | POA: Diagnosis not present

## 2018-10-24 DIAGNOSIS — M1991 Primary osteoarthritis, unspecified site: Secondary | ICD-10-CM | POA: Diagnosis not present

## 2018-10-24 NOTE — Telephone Encounter (Signed)
Home health usually asks for the verbal ok so just call them back and give the verbal ok for them to do so and they will fax over an order for the dr to sign

## 2018-10-24 NOTE — Telephone Encounter (Signed)
Son Chase Caller would like Dr Moshe Cipro to return a call at [phone] in regards to some questions he had about his dad Samuel Ryan

## 2018-10-24 NOTE — Telephone Encounter (Signed)
pls document what he is asking I will send response back, thanks

## 2018-10-25 ENCOUNTER — Ambulatory Visit (INDEPENDENT_AMBULATORY_CARE_PROVIDER_SITE_OTHER): Payer: Medicare HMO | Admitting: Urology

## 2018-10-25 DIAGNOSIS — R3915 Urgency of urination: Secondary | ICD-10-CM | POA: Diagnosis not present

## 2018-10-25 DIAGNOSIS — N401 Enlarged prostate with lower urinary tract symptoms: Secondary | ICD-10-CM | POA: Diagnosis not present

## 2018-10-31 NOTE — Telephone Encounter (Signed)
Coming by to collect note

## 2018-11-01 DIAGNOSIS — M1991 Primary osteoarthritis, unspecified site: Secondary | ICD-10-CM | POA: Diagnosis not present

## 2018-11-01 DIAGNOSIS — F329 Major depressive disorder, single episode, unspecified: Secondary | ICD-10-CM | POA: Diagnosis not present

## 2018-11-01 DIAGNOSIS — N400 Enlarged prostate without lower urinary tract symptoms: Secondary | ICD-10-CM | POA: Diagnosis not present

## 2018-11-01 DIAGNOSIS — E1122 Type 2 diabetes mellitus with diabetic chronic kidney disease: Secondary | ICD-10-CM | POA: Diagnosis not present

## 2018-11-01 DIAGNOSIS — I129 Hypertensive chronic kidney disease with stage 1 through stage 4 chronic kidney disease, or unspecified chronic kidney disease: Secondary | ICD-10-CM | POA: Diagnosis not present

## 2018-11-01 DIAGNOSIS — M6282 Rhabdomyolysis: Secondary | ICD-10-CM | POA: Diagnosis not present

## 2018-11-01 DIAGNOSIS — R0902 Hypoxemia: Secondary | ICD-10-CM | POA: Diagnosis not present

## 2018-11-01 DIAGNOSIS — G309 Alzheimer's disease, unspecified: Secondary | ICD-10-CM | POA: Diagnosis not present

## 2018-11-01 DIAGNOSIS — W19XXXD Unspecified fall, subsequent encounter: Secondary | ICD-10-CM | POA: Diagnosis not present

## 2018-11-01 DIAGNOSIS — E1129 Type 2 diabetes mellitus with other diabetic kidney complication: Secondary | ICD-10-CM | POA: Diagnosis not present

## 2018-11-01 DIAGNOSIS — F0280 Dementia in other diseases classified elsewhere without behavioral disturbance: Secondary | ICD-10-CM | POA: Diagnosis not present

## 2018-11-01 DIAGNOSIS — N183 Chronic kidney disease, stage 3 (moderate): Secondary | ICD-10-CM | POA: Diagnosis not present

## 2018-11-01 DIAGNOSIS — G308 Other Alzheimer's disease: Secondary | ICD-10-CM | POA: Diagnosis not present

## 2018-11-02 ENCOUNTER — Other Ambulatory Visit: Payer: Self-pay | Admitting: Urology

## 2018-11-02 MED ORDER — ONABOTULINUMTOXINA 100 UNITS IJ SOLR: 100.0000 [IU] | Freq: Once | INTRAMUSCULAR | Status: AC

## 2018-11-03 ENCOUNTER — Other Ambulatory Visit: Payer: Self-pay

## 2018-11-03 ENCOUNTER — Telehealth: Payer: Self-pay | Admitting: *Deleted

## 2018-11-03 ENCOUNTER — Other Ambulatory Visit: Payer: Self-pay | Admitting: Cardiology

## 2018-11-03 MED ORDER — METOPROLOL TARTRATE 25 MG PO TABS: 25.0000 mg | ORAL_TABLET | Freq: Two times a day (BID) | ORAL | 6 refills | Status: DC

## 2018-11-03 NOTE — Telephone Encounter (Signed)
Son advised refills sent into [facility] with verbal understanding.

## 2018-11-03 NOTE — Telephone Encounter (Signed)
Son called stating this medication was started in hospital Metoprolol tart 25mg  2 times a day. He has enough to last until Monday morning but wanted to know if a refill could be called in as he has none after Monday.

## 2018-11-03 NOTE — Telephone Encounter (Signed)
° ° ° °  1. Which medications need to be refilled? (please list name of each medication and dose if known)     metoprolol tartrate (LOPRESSOR) 25 MG    2. Which pharmacy/location (including street and city if local pharmacy) is medication to be sent to?  [facility] - [phone]  3. Do they need a 30 day or 90 day supply?  Son states that he only has 3 pills left

## 2018-11-03 NOTE — Telephone Encounter (Signed)
Yes ,please send in 6 refills and let him know

## 2018-11-03 NOTE — Telephone Encounter (Signed)
Do you want patient to continue this med?

## 2018-11-08 ENCOUNTER — Encounter (HOSPITAL_COMMUNITY)
Admission: RE | Admit: 2018-11-08 | Discharge: 2018-11-08 | Disposition: A | Discharge status: Home / Self Care | Payer: Medicare HMO | Source: Ambulatory Visit | Attending: Urology | Admitting: Urology

## 2018-11-08 ENCOUNTER — Encounter (HOSPITAL_COMMUNITY): Payer: Self-pay

## 2018-11-08 DIAGNOSIS — N183 Chronic kidney disease, stage 3 (moderate): Secondary | ICD-10-CM | POA: Diagnosis not present

## 2018-11-08 DIAGNOSIS — G309 Alzheimer's disease, unspecified: Secondary | ICD-10-CM | POA: Diagnosis not present

## 2018-11-08 DIAGNOSIS — M1991 Primary osteoarthritis, unspecified site: Secondary | ICD-10-CM | POA: Diagnosis not present

## 2018-11-08 DIAGNOSIS — N400 Enlarged prostate without lower urinary tract symptoms: Secondary | ICD-10-CM | POA: Diagnosis not present

## 2018-11-08 DIAGNOSIS — W19XXXD Unspecified fall, subsequent encounter: Secondary | ICD-10-CM | POA: Diagnosis not present

## 2018-11-08 DIAGNOSIS — I129 Hypertensive chronic kidney disease with stage 1 through stage 4 chronic kidney disease, or unspecified chronic kidney disease: Secondary | ICD-10-CM | POA: Diagnosis not present

## 2018-11-08 DIAGNOSIS — E1122 Type 2 diabetes mellitus with diabetic chronic kidney disease: Secondary | ICD-10-CM | POA: Diagnosis not present

## 2018-11-08 DIAGNOSIS — F0280 Dementia in other diseases classified elsewhere without behavioral disturbance: Secondary | ICD-10-CM | POA: Diagnosis not present

## 2018-11-08 DIAGNOSIS — M6282 Rhabdomyolysis: Secondary | ICD-10-CM | POA: Diagnosis not present

## 2018-11-09 ENCOUNTER — Encounter: Payer: Self-pay | Admitting: Family Medicine

## 2018-11-10 ENCOUNTER — Ambulatory Visit (HOSPITAL_COMMUNITY)
Admission: RE | Admit: 2018-11-10 | Discharge: 2018-11-10 | Disposition: A | Discharge status: Home / Self Care | Payer: Medicare HMO | Attending: Urology | Admitting: Urology

## 2018-11-10 ENCOUNTER — Encounter (HOSPITAL_COMMUNITY): Payer: Self-pay | Admitting: *Deleted

## 2018-11-10 ENCOUNTER — Ambulatory Visit (HOSPITAL_COMMUNITY): Payer: Medicare HMO | Admitting: Anesthesiology

## 2018-11-10 ENCOUNTER — Encounter (HOSPITAL_COMMUNITY)
Admission: RE | Disposition: A | Discharge status: Home / Self Care | Payer: Self-pay | Source: Home / Self Care | Attending: Urology

## 2018-11-10 ENCOUNTER — Other Ambulatory Visit: Payer: Self-pay

## 2018-11-10 DIAGNOSIS — I10 Essential (primary) hypertension: Secondary | ICD-10-CM | POA: Diagnosis not present

## 2018-11-10 DIAGNOSIS — F0390 Unspecified dementia without behavioral disturbance: Secondary | ICD-10-CM | POA: Diagnosis not present

## 2018-11-10 DIAGNOSIS — N319 Neuromuscular dysfunction of bladder, unspecified: Secondary | ICD-10-CM

## 2018-11-10 DIAGNOSIS — Z8249 Family history of ischemic heart disease and other diseases of the circulatory system: Secondary | ICD-10-CM | POA: Insufficient documentation

## 2018-11-10 DIAGNOSIS — E119 Type 2 diabetes mellitus without complications: Secondary | ICD-10-CM | POA: Insufficient documentation

## 2018-11-10 DIAGNOSIS — Z880 Allergy status to penicillin: Secondary | ICD-10-CM | POA: Diagnosis not present

## 2018-11-10 DIAGNOSIS — N3941 Urge incontinence: Secondary | ICD-10-CM | POA: Insufficient documentation

## 2018-11-10 DIAGNOSIS — Z881 Allergy status to other antibiotic agents status: Secondary | ICD-10-CM | POA: Insufficient documentation

## 2018-11-10 DIAGNOSIS — Z87892 Personal history of anaphylaxis: Secondary | ICD-10-CM | POA: Insufficient documentation

## 2018-11-10 DIAGNOSIS — F419 Anxiety disorder, unspecified: Secondary | ICD-10-CM | POA: Diagnosis not present

## 2018-11-10 DIAGNOSIS — Z96651 Presence of right artificial knee joint: Secondary | ICD-10-CM | POA: Diagnosis not present

## 2018-11-10 DIAGNOSIS — N3281 Overactive bladder: Secondary | ICD-10-CM | POA: Diagnosis not present

## 2018-11-10 DIAGNOSIS — Z86718 Personal history of other venous thrombosis and embolism: Secondary | ICD-10-CM | POA: Insufficient documentation

## 2018-11-10 DIAGNOSIS — M1990 Unspecified osteoarthritis, unspecified site: Secondary | ICD-10-CM | POA: Diagnosis not present

## 2018-11-10 DIAGNOSIS — K219 Gastro-esophageal reflux disease without esophagitis: Secondary | ICD-10-CM | POA: Diagnosis not present

## 2018-11-10 DIAGNOSIS — Z86711 Personal history of pulmonary embolism: Secondary | ICD-10-CM | POA: Diagnosis not present

## 2018-11-10 HISTORY — PX: CYSTOSCOPY WITH INJECTION: SHX1424

## 2018-11-10 LAB — GLUCOSE, CAPILLARY: Glucose-Capillary: 97 mg/dL (ref 70–99)

## 2018-11-10 SURGERY — CYSTOSCOPY, WITH INJECTION OF BLADDER NECK OR BLADDER WALL
Anesthesia: General | Site: Bladder

## 2018-11-10 MED ORDER — PROPOFOL 500 MG/50ML IV EMUL
INTRAVENOUS | Status: DC | PRN
  Administered 2018-11-10: 100 ug/kg/min via INTRAVENOUS

## 2018-11-10 MED ORDER — HYDROCODONE-ACETAMINOPHEN 7.5-325 MG PO TABS: 1.0000 | ORAL_TABLET | Freq: Once | ORAL | Status: DC | PRN

## 2018-11-10 MED ORDER — CIPROFLOXACIN IN D5W 400 MG/200ML IV SOLN
INTRAVENOUS | Status: DC | PRN
  Administered 2018-11-10: 400 mg via INTRAVENOUS

## 2018-11-10 MED ORDER — LACTATED RINGERS IV SOLN
INTRAVENOUS | Status: DC
  Administered 2018-11-10: 12:00:00 via INTRAVENOUS

## 2018-11-10 MED ORDER — CEFAZOLIN SODIUM-DEXTROSE 2-4 GM/100ML-% IV SOLN: 2.0000 g | INTRAVENOUS | Status: DC

## 2018-11-10 MED ORDER — SODIUM CHLORIDE (PF) 0.9 % IJ SOLN
INTRAMUSCULAR | Status: AC
  Filled 2018-11-10: qty 20

## 2018-11-10 MED ORDER — LIDOCAINE HCL URETHRAL/MUCOSAL 2 % EX GEL
CUTANEOUS | Status: AC
  Filled 2018-11-10: qty 10

## 2018-11-10 MED ORDER — ONABOTULINUMTOXINA 100 UNITS IJ SOLR
INTRAMUSCULAR | Status: DC | PRN
  Administered 2018-11-10: 100 [IU] via INTRAMUSCULAR

## 2018-11-10 MED ORDER — LIDOCAINE HCL URETHRAL/MUCOSAL 2 % EX GEL
CUTANEOUS | Status: DC | PRN
  Administered 2018-11-10: 1 via URETHRAL

## 2018-11-10 MED ORDER — HYDROMORPHONE HCL 1 MG/ML IJ SOLN: 0.2500 mg | INTRAMUSCULAR | Status: DC | PRN

## 2018-11-10 MED ORDER — PROPOFOL 10 MG/ML IV BOLUS
INTRAVENOUS | Status: AC
  Filled 2018-11-10: qty 20

## 2018-11-10 MED ORDER — CEFAZOLIN SODIUM-DEXTROSE 2-4 GM/100ML-% IV SOLN
INTRAVENOUS | Status: AC
  Filled 2018-11-10: qty 100

## 2018-11-10 MED ORDER — PROMETHAZINE HCL 25 MG/ML IJ SOLN: 6.2500 mg | INTRAMUSCULAR | Status: DC | PRN

## 2018-11-10 MED ORDER — CIPROFLOXACIN IN D5W 400 MG/200ML IV SOLN
INTRAVENOUS | Status: AC
  Filled 2018-11-10: qty 200

## 2018-11-10 MED ORDER — WATER FOR IRRIGATION, STERILE IR SOLN
Status: DC | PRN
  Administered 2018-11-10: 3000 mL

## 2018-11-10 MED ORDER — ONABOTULINUMTOXINA 100 UNITS IJ SOLR
100.0000 [IU] | Freq: Once | INTRAMUSCULAR | Status: DC
  Filled 2018-11-10: qty 100

## 2018-11-10 MED ORDER — MIDAZOLAM HCL 2 MG/2ML IJ SOLN: 0.5000 mg | Freq: Once | INTRAMUSCULAR | Status: DC | PRN

## 2018-11-10 SURGICAL SUPPLY — 21 items
BAG DRAIN URO TABLE W/ADPT NS (BAG) ×3 IMPLANT
BAG DRN 8 ADPR NS SKTRN CSTL (BAG) ×1
CLOTH BEACON ORANGE TIMEOUT ST (SAFETY) ×3 IMPLANT
GLOVE BIOGEL M 8.0 STRL (GLOVE) ×3 IMPLANT
GLOVE BIOGEL PI IND STRL 7.0 (GLOVE) ×2 IMPLANT
GLOVE BIOGEL PI INDICATOR 7.0 (GLOVE) ×4
GLOVE ECLIPSE 6.5 STRL STRAW (GLOVE) ×3 IMPLANT
GOWN STRL REUS W/ TWL XL LVL3 (GOWN DISPOSABLE) ×1 IMPLANT
GOWN STRL REUS W/TWL XL LVL3 (GOWN DISPOSABLE) ×6 IMPLANT
KIT TURNOVER CYSTO (KITS) ×3 IMPLANT
MANIFOLD NEPTUNE II (INSTRUMENTS) ×3 IMPLANT
NDL ASPIRATION 22 (NEEDLE) ×1 IMPLANT
NDL HYPO 18GX1.5 BLUNT FILL (NEEDLE) ×1 IMPLANT
NEEDLE ASPIRATION 22 (NEEDLE) ×3 IMPLANT
NEEDLE HYPO 18GX1.5 BLUNT FILL (NEEDLE) ×3 IMPLANT
PACK CYSTO (CUSTOM PROCEDURE TRAY) ×3 IMPLANT
PAD ARMBOARD 7.5X6 YLW CONV (MISCELLANEOUS) ×3 IMPLANT
SYR 20CC LL (SYRINGE) ×3 IMPLANT
SYR CONTROL 10ML LL (SYRINGE) ×3 IMPLANT
TOWEL OR 17X26 4PK STRL BLUE (TOWEL DISPOSABLE) ×3 IMPLANT
WATER STERILE IRR 3000ML UROMA (IV SOLUTION) ×3 IMPLANT

## 2018-11-10 NOTE — Transfer of Care (Signed)
Immediate Anesthesia Transfer of Care Note  Patient: Samuel Ryan  Procedure(s) Performed: CYSTOSCOPY WITH BOTOX  INJECTION (N/A Bladder)  Patient Location: PACU  Anesthesia Type:MAC  Level of Consciousness: awake and alert   Airway & Oxygen Therapy: Patient Spontanous Breathing and Patient connected to face mask oxygen  Post-op Assessment: Report given to RN and Post -op Vital signs reviewed and stable  Post vital signs: Reviewed and stable  Last Vitals:  Vitals Value Taken Time  BP    Temp    Pulse 90 11/10/2018  1:22 PM  Resp    SpO2 92 % 11/10/2018  1:22 PM  Vitals shown include unvalidated device data.  Last Pain:  Vitals:   11/10/18 1121  TempSrc: Oral  PainSc: 0-No pain         Complications: No apparent anesthesia complications

## 2018-11-10 NOTE — Anesthesia Postprocedure Evaluation (Signed)
Anesthesia Post Note  Patient: CAMBREN HELM  Procedure(s) Performed: CYSTOSCOPY WITH BOTOX  INJECTION (N/A Bladder)  Patient location during evaluation: PACU Anesthesia Type: General Level of consciousness: awake and alert Pain management: pain level controlled Vital Signs Assessment: post-procedure vital signs reviewed and stable Respiratory status: spontaneous breathing, patient connected to face mask oxygen, respiratory function stable and nonlabored ventilation Cardiovascular status: stable Postop Assessment: no apparent nausea or vomiting Anesthetic complications: no     Last Vitals:  Vitals:   11/10/18 1121  BP: (!) 139/93  Pulse: 94  Resp: 19  Temp: 36.8 C  SpO2: 93%    Last Pain:  Vitals:   11/10/18 1121  TempSrc: Oral  PainSc: 0-No pain                 Jahrell Hamor

## 2018-11-10 NOTE — Anesthesia Preprocedure Evaluation (Signed)
Anesthesia Evaluation  Patient identified by MRN, date of birth, ID band Patient awake    Reviewed: Allergy & Precautions, NPO status , Patient's Chart, lab work & pertinent test results  Airway Mallampati: I  TM Distance: >3 FB Neck ROM: Full    Dental no notable dental hx. (+) Edentulous Upper, Edentulous Lower   Pulmonary neg pulmonary ROS,    Pulmonary exam normal breath sounds clear to auscultation + decreased breath sounds      Cardiovascular Exercise Tolerance: Poor hypertension, Pt. on medications negative cardio ROS Normal cardiovascular exam Rhythm:Regular Rate:Normal  Dementia  Sedentary  Uses home o2 prn  Has resting tremor     Neuro/Psych Anxiety Dementia  Neuromuscular disease negative psych ROS   GI/Hepatic Neg liver ROS, GERD  Medicated and Controlled,  Endo/Other  negative endocrine ROSdiabetes  Renal/GU negative Renal ROSHere for repeat cysto/Botox  negative genitourinary   Musculoskeletal  (+) Arthritis ,   Abdominal   Peds negative pediatric ROS (+)  Hematology negative hematology ROS (+) anemia ,   Anesthesia Other Findings   Reproductive/Obstetrics negative OB ROS                             Anesthesia Physical Anesthesia Plan  ASA: IV  Anesthesia Plan: General   Post-op Pain Management:    Induction:   PONV Risk Score and Plan:   Airway Management Planned: Nasal Cannula and Simple Face Mask  Additional Equipment:   Intra-op Plan:   Post-operative Plan:   Informed Consent:   Plan Discussed with:   Anesthesia Plan Comments: (Plan GA with mask LMA vs ETT as needed  Son worried about STML Will avoid Versed and use mostly Propofol as last time )        Anesthesia Quick Evaluation

## 2018-11-10 NOTE — Discharge Instructions (Signed)
Botulinum Toxin Bladder Injection, Care After Refer to this sheet in the next few weeks. These instructions provide you with information about caring for yourself after your procedure. Your health care provider may also give you more specific instructions. Your treatment has been planned according to current medical practices, but problems sometimes occur. Call your health care provider if you have any problems or questions after your procedure. What can I expect after the procedure? After the procedure, it is common to have:  Blood-tinged urine.  Burning or soreness when you pass urine.  Follow these instructions at home:   Do not drive for 24 hours if you received a sedative.  Take over-the-counter and prescription medicines only as told by your health care provider.  If you were prescribed an antibiotic medicine, take it as told by your health care provider. Do not stop taking the antibiotic even if you start to feel better.  Drink enough fluid to keep your urine clear or pale yellow.  Return to your normal activities as told by your health care provider. Ask your health care provider what activities are safe for you.  Keep all follow-up visits as told by your health care provider. This is important. Contact a health care provider if:  You have a fever or chills.  You have blood-tinged urine for more than one day after your procedure.  You have worsening pain or burning when you pass urine.  You have pain or burning when passing urine for more than two days after your procedure.  You have trouble emptying your bladder. Get help right away if:  You have bright red blood in your urine.  You are unable to pass urine. This information is not intended to replace advice given to you by your health care provider. Make sure you discuss any questions you have with your health care provider. Document Released: 08/06/2015 Document Revised: 06/04/2016 Document Reviewed:  05/14/2015 Elsevier Interactive Patient Education  2018 [place], Care After These instructions provide you with information about caring for yourself after your procedure. Your health care provider may also give you more specific instructions. Your treatment has been planned according to current medical practices, but problems sometimes occur. Call your health care provider if you have any problems or questions after your procedure. What can I expect after the procedure? After your procedure, it is common to:  Feel sleepy for several hours.  Feel clumsy and have poor balance for several hours.  Feel forgetful about what happened after the procedure.  Have poor judgment for several hours.  Feel nauseous or vomit.  Have a sore throat if you had a breathing tube during the procedure.  Follow these instructions at home: For at least 24 hours after the procedure:   Do not: ? Participate in activities in which you could fall or become injured. ? Drive. ? Use heavy machinery. ? Drink alcohol. ? Take sleeping pills or medicines that cause drowsiness. ? Make important decisions or sign legal documents. ? Take care of children on your own.  Rest. Eating and drinking  Follow the diet that is recommended by your health care provider.  If you vomit, drink water, juice, or soup when you can drink without vomiting.  Make sure you have little or no nausea before eating solid foods. General instructions  Have a responsible adult stay with you until you are awake and alert.  Take over-the-counter and prescription medicines only as told by your  health care provider.  If you smoke, do not smoke without supervision.  Keep all follow-up visits as told by your health care provider. This is important. Contact a health care provider if:  You keep feeling nauseous or you keep vomiting.  You feel light-headed.  You develop a rash.  You have a  fever. Get help right away if:  You have trouble breathing. This information is not intended to replace advice given to you by your health care provider. Make sure you discuss any questions you have with your health care provider. Document Released: 03/07/2016 Document Revised: 07/07/2016 Document Reviewed: 03/07/2016 Elsevier Interactive Patient Education  [employer].

## 2018-11-10 NOTE — H&P (Signed)
Urology Admission H&P  Chief Complaint: urinary incontinence  History of Present Illness: Samuel Ryan is a 84yo with a hx OAb that has been refractory to medical therapy. He had intravesical botox 6 months ago and was doing well until recently when his incontinence worsened. He denies any dysuria, hematuria. He has urgency, urge incontinence. NO issues with incomplete emptying  Past Medical History:  Diagnosis Date  . Acute cholecystitis 11/2012   Drained percutaneously  . Anxiety   . BPH (benign prostatic hypertrophy)   . Deep venous thrombosis (HCC)    Left leg, diagnosed 7/12  . Dementia [facility])    Needs supervision for safety per son - POA  . Diverticulitis   . Essential hypertension, benign   . GERD (gastroesophageal reflux disease)   . History of pneumonia    Prior to 2012  . Incontinence of urine   . Mixed hyperlipidemia   . Osteoarthritis   . Pulmonary embolism ([place])    Diagnosed 7/12  . Type 2 diabetes mellitus ([place])    Past Surgical History:  Procedure Laterality Date  . CARPAL TUNNEL RELEASE    . COLONOSCOPY  08/31/2012   Procedure: COLONOSCOPY;  Surgeon: Rogene Houston, MD;  [facility];  Service: Endoscopy;  Laterality: N/A;  830  . CYSTOSCOPY WITH INJECTION N/A 05/15/2018   Procedure: CYSTOSCOPY WITH BOTOX INJECTION;  Surgeon: Cleon Gustin, MD;  Location: [facility] ORS;  Service: Urology;  Laterality: N/A;  . CYSTOSCOPY WITH INSERTION OF UROLIFT N/A 09/02/2016   Procedure: CYSTOSCOPY WITH INSERTION OF UROLIFT;  Surgeon: Cleon Gustin, MD;  Location: [facility];  Service: Urology;  Laterality: N/A;  . EYE SURGERY Bilateral    bilateral cataract  . Right total knee replacement  04/23/2011  . SPINE SURGERY  prior to 2012   Neck fusion  . UMBILICAL HERNIA REPAIR     umbilical    Home Medications:  Current Facility-Administered Medications  Medication Dose Route Frequency Provider Last Rate Last Dose  . botulinum toxin Type A (BOTOX)  injection 100 Units  100 Units Intramuscular Once Cleon Gustin, MD      . ceFAZolin (ANCEF) IVPB 2g/100 mL premix  2 g Intravenous 30 min Pre-Op Alyson Ingles [name], MD      . lactated ringers infusion   Intravenous Continuous Lenice Llamas, MD 50 mL/hr at 11/10/18 1142     Facility-Administered Medications Ordered in Other Encounters  Medication Dose Route Frequency Provider Last Rate Last Dose  . botulinum toxin Type A (BOTOX) injection 100 Units  100 Units Intramuscular Once , [name], MD       Allergies:  Allergies  Allergen Reactions  . Zosyn [Piperacillin Sod-Tazobactam So] Anaphylaxis, Swelling and Other (See Comments)    Facial swelling Has patient had a PCN reaction causing immediate rash, facial/tongue/throat swelling, SOB or lightheadedness with hypotension: Yes Has patient had a PCN reaction causing severe rash involving mucus membranes or skin necrosis: No Has patient had a PCN reaction that required hospitalization: No Has patient had a PCN reaction occurring within the last 10 years: Yes If all of the above answers are "NO", then may proceed with Cephalosporin use.   . Ace Inhibitors Cough  . Vancomycin Swelling    Facial swelling    Family History  Problem Relation Age of Onset  . Cancer Mother        Stomach  . Diabetes Sister   . Hypertension Sister   . Diabetes Brother   .  Cancer Brother        Gallbladder  . Seizures Son        due to meningitis as a child   Social History:  reports that he has never smoked. He has never used smokeless tobacco. He reports that he does not drink alcohol or use drugs.  Review of Systems  Genitourinary: Positive for frequency and urgency.  All other systems reviewed and are negative.   Physical Exam:  Vital signs in last 24 hours: Temp:  [98.3 F (36.8 C)] 98.3 F (36.8 C) (12/13 1121) Pulse Rate:  [94] 94 (12/13 1121) Resp:  [19] 19 (12/13 1121) BP: (139)/(93) 139/93 (12/13 1121) SpO2:  [93 %]  93 % (12/13 1121) Weight:  [116.1 kg] 116.1 kg (12/13 1107) Physical Exam  Constitutional: He is oriented to person, place, and time. He appears well-developed and well-nourished.  HENT:  Head: Normocephalic and atraumatic.  Eyes: Pupils are equal, round, and reactive to light. EOM are normal.  Neck: Normal range of motion. No thyromegaly present.  Cardiovascular: Normal rate and regular rhythm.  Respiratory: Effort normal. No respiratory distress.  GI: Soft. He exhibits no distension.  Musculoskeletal: Normal range of motion.        General: No edema.  Neurological: He is alert and oriented to person, place, and time.  Skin: Skin is warm and dry.  Psychiatric: He has a normal mood and affect. His behavior is normal. Judgment and thought content normal.    Laboratory Data:  Results for orders placed or performed during the hospital encounter of 11/10/18 (from the past 24 hour(s))  Glucose, capillary     Status: None   Collection Time: 11/10/18 11:20 AM  Result Value Ref Range   Glucose-Capillary 97 70 - 99 mg/dL   No results found for this or any previous visit (from the past 240 hour(s)). Creatinine: No results for input(s): CREATININE in the last 168 hours. Baseline Creatinine: unknown  Impression/Assessment:  84yo with OAB refractory to medical therapy  Plan:  The risks/benefits/alternatives to intravesical botox was explained to the patient and he understands and wishes to proceed with surgery  Nicolette Bang 11/10/2018, 12:24 PM

## 2018-11-10 NOTE — Op Note (Signed)
Preoperative diagnosis: neurogenic bladder  Postoperative diagnosis: same  Procedure: 1 cystoscopy 2. Intravesical botox injection 100 units  Attending: Nicolette Bang  Anesthesia: General  Estimated blood loss: Minimal  Drains: none  Specimens: none  Antibiotics: cipro  Findings:  Ureteral orifices in normal anatomic location.  No masses/lesions in the bladder  Indications: Patient is a 84 year old male with a history of OAB and urinary leakage refractory to anticholinergic therapy.  After discussing treatment options, they decided proceed with intravesical botox injection.  Procedure her in detail: The patient was brought to the operating room and a brief timeout was done to ensure correct patient, correct procedure, correct site.  General anesthesia was administered patient was placed in dorsal lithotomy position.  Their genitalia was then prepped and draped in usual sterile fashion.  A rigid 60 French cystoscope was passed in the urethra and the bladder.  Bladder was inspected and we noted no masses or lesions.  the ureteral orifices were in the normal orthotopic locations. Using a deflux injection needle we proceed to inject 100 units in a grid pattern between the ureteral orifices starting at the trigone to the mid posterior wall. We injected a total of 20 sites with 5 units per injection. The bladder was then drained and this concluded the procedure which was well tolerated by patient.  Complications: None  Condition: Stable, extubated, transferred to PACU  Plan: Patient is to be discharge home. They will followup in 1 month

## 2018-11-13 ENCOUNTER — Encounter (HOSPITAL_COMMUNITY): Payer: Self-pay | Admitting: Urology

## 2018-11-15 DIAGNOSIS — G309 Alzheimer's disease, unspecified: Secondary | ICD-10-CM | POA: Diagnosis not present

## 2018-11-15 DIAGNOSIS — N183 Chronic kidney disease, stage 3 (moderate): Secondary | ICD-10-CM | POA: Diagnosis not present

## 2018-11-15 DIAGNOSIS — M6282 Rhabdomyolysis: Secondary | ICD-10-CM | POA: Diagnosis not present

## 2018-11-15 DIAGNOSIS — W19XXXD Unspecified fall, subsequent encounter: Secondary | ICD-10-CM | POA: Diagnosis not present

## 2018-11-15 DIAGNOSIS — F0280 Dementia in other diseases classified elsewhere without behavioral disturbance: Secondary | ICD-10-CM | POA: Diagnosis not present

## 2018-11-15 DIAGNOSIS — M1991 Primary osteoarthritis, unspecified site: Secondary | ICD-10-CM | POA: Diagnosis not present

## 2018-11-15 DIAGNOSIS — N400 Enlarged prostate without lower urinary tract symptoms: Secondary | ICD-10-CM | POA: Diagnosis not present

## 2018-11-15 DIAGNOSIS — E1122 Type 2 diabetes mellitus with diabetic chronic kidney disease: Secondary | ICD-10-CM | POA: Diagnosis not present

## 2018-11-15 DIAGNOSIS — I129 Hypertensive chronic kidney disease with stage 1 through stage 4 chronic kidney disease, or unspecified chronic kidney disease: Secondary | ICD-10-CM | POA: Diagnosis not present

## 2018-11-16 DIAGNOSIS — M6282 Rhabdomyolysis: Secondary | ICD-10-CM | POA: Diagnosis not present

## 2018-11-16 DIAGNOSIS — E1122 Type 2 diabetes mellitus with diabetic chronic kidney disease: Secondary | ICD-10-CM | POA: Diagnosis not present

## 2018-11-16 DIAGNOSIS — G309 Alzheimer's disease, unspecified: Secondary | ICD-10-CM | POA: Diagnosis not present

## 2018-11-16 DIAGNOSIS — N400 Enlarged prostate without lower urinary tract symptoms: Secondary | ICD-10-CM | POA: Diagnosis not present

## 2018-11-16 DIAGNOSIS — N183 Chronic kidney disease, stage 3 (moderate): Secondary | ICD-10-CM | POA: Diagnosis not present

## 2018-11-16 DIAGNOSIS — W19XXXD Unspecified fall, subsequent encounter: Secondary | ICD-10-CM | POA: Diagnosis not present

## 2018-11-16 DIAGNOSIS — F0280 Dementia in other diseases classified elsewhere without behavioral disturbance: Secondary | ICD-10-CM | POA: Diagnosis not present

## 2018-11-16 DIAGNOSIS — I129 Hypertensive chronic kidney disease with stage 1 through stage 4 chronic kidney disease, or unspecified chronic kidney disease: Secondary | ICD-10-CM | POA: Diagnosis not present

## 2018-11-16 DIAGNOSIS — M1991 Primary osteoarthritis, unspecified site: Secondary | ICD-10-CM | POA: Diagnosis not present

## 2018-11-17 ENCOUNTER — Encounter: Payer: Self-pay | Admitting: Cardiology

## 2018-11-17 ENCOUNTER — Ambulatory Visit (INDEPENDENT_AMBULATORY_CARE_PROVIDER_SITE_OTHER): Payer: Medicare HMO | Admitting: Cardiology

## 2018-11-17 VITALS — BP 147/75 | HR 97 | Ht 70.0 in | Wt 260.2 lb

## 2018-11-17 DIAGNOSIS — F0390 Unspecified dementia without behavioral disturbance: Secondary | ICD-10-CM

## 2018-11-17 DIAGNOSIS — I471 Supraventricular tachycardia: Secondary | ICD-10-CM | POA: Diagnosis not present

## 2018-11-17 DIAGNOSIS — E782 Mixed hyperlipidemia: Secondary | ICD-10-CM

## 2018-11-17 DIAGNOSIS — I10 Essential (primary) hypertension: Secondary | ICD-10-CM | POA: Diagnosis not present

## 2018-11-17 MED ORDER — ATENOLOL 50 MG PO TABS: 50.0000 mg | ORAL_TABLET | Freq: Every day | ORAL | 1 refills | Status: DC

## 2018-11-17 NOTE — Patient Instructions (Addendum)
Medication Instructions:   Your physician has recommended you make the following change in your medication:   STOP METOPROLOL TARTRATE  Start Atenolol 50 mg by mouth daily.  Continue all other medications the same.  Labwork:  NONE  Testing/Procedures:  NONE  Follow-Up:  Your physician recommends that you schedule a follow-up appointment in: 6 months. You will receive a reminder letter in the mail in about 4 months reminding you to call and schedule your appointment. If you don't receive this letter, please contact our office.  Any Other Special Instructions Will Be Listed Below (If Applicable).  If you need a refill on your cardiac medications before your next appointment, please call your pharmacy.

## 2018-11-17 NOTE — Progress Notes (Signed)
Cardiology Office Note  Date: 11/17/2018   ID: Samuel Ryan, DOB 02-Jan-1931, MRN [id number]  PCP: Samuel Helper, MD  Primary Cardiologist: Samuel Lesches, MD   Chief Complaint  Patient presents with  . Follow-up SVT    History of Present Illness: Samuel Ryan is an 84 y.o. male that I last saw in June 2018.  He is referred back to the office by Dr. Moshe Ryan for follow-up of SVT.  I reviewed available records, he was hospitalized back in October with hypoxic respiratory failure and question of aspiration pneumonitis.  In that setting he developed SVT and was started on Lopressor.  He presents today with his son for follow-up.  He does not report any palpitations.  His son feels like Mr. Grape has had change in appetite and taste since being on Lopressor.  We did discuss other options.  His resting heart rate is generally in the 80s to 90s.  He has chronic dyspnea on exertion that has not changed.  No syncope.  Echocardiogram from October reported mild LVH with LVEF 60 to 90% and mild diastolic dysfunction.  Past Medical History:  Diagnosis Date  . Acute cholecystitis 11/2012   Drained percutaneously  . Anxiety   . BPH (benign prostatic hypertrophy)   . Deep venous thrombosis (HCC)    Left leg, diagnosed 7/12  . Dementia [facility])    Needs supervision for safety per son - POA  . Diverticulitis   . Essential hypertension, benign   . GERD (gastroesophageal reflux disease)   . History of pneumonia    Prior to 2012  . Incontinence of urine   . Mixed hyperlipidemia   . Osteoarthritis   . Pulmonary embolism ([place])    Diagnosed 7/12  . Type 2 diabetes mellitus ([place])     Past Surgical History:  Procedure Laterality Date  . CARPAL TUNNEL RELEASE    . COLONOSCOPY  08/31/2012   Procedure: COLONOSCOPY;  Surgeon: Rogene Houston, MD;  [facility];  Service: Endoscopy;  Laterality: N/A;  830  . CYSTOSCOPY WITH INJECTION N/A 05/15/2018   Procedure: CYSTOSCOPY WITH  BOTOX INJECTION;  Surgeon: Cleon Gustin, MD;  Location: [facility];  Service: Urology;  Laterality: N/A;  . CYSTOSCOPY WITH INJECTION N/A 11/10/2018   Procedure: CYSTOSCOPY WITH BOTOX  INJECTION;  Surgeon: Cleon Gustin, MD;  Location: [facility];  Service: Urology;  Laterality: N/A;  30 MINS  . CYSTOSCOPY WITH INSERTION OF UROLIFT N/A 09/02/2016   Procedure: CYSTOSCOPY WITH INSERTION OF UROLIFT;  Surgeon: Cleon Gustin, MD;  Location: [facility];  Service: Urology;  Laterality: N/A;  . EYE SURGERY Bilateral    bilateral cataract  . Right total knee replacement  04/23/2011  . SPINE SURGERY  prior to 2012   Neck fusion  . UMBILICAL HERNIA REPAIR     umbilical    Current Outpatient Medications  Medication Sig Dispense Refill  . acetaminophen (TYLENOL) 500 MG tablet Take 1,000 mg by mouth every 6 (six) hours as needed for mild pain, moderate pain or headache.     . Blood Glucose Monitoring Suppl (TRUE METRIX METER) w/Device KIT With supplies, alcohol swabs as covered by insurance 1 kit 0  . clobetasol cream (TEMOVATE) 2.11 % APPLY 1 APPLICATION TOPICALLY 2 (TWO) TIMES DAILY. (Patient taking differently: Apply 1 application topically 2 (two) times a week. ) 30 g 0  . donepezil (ARICEPT) 10 MG tablet TAKE 1 TABLET AT BEDTIME (Patient taking  differently: Take 10 mg by mouth at bedtime. ) 90 tablet 1  . Ferrous Sulfate (IRON) 325 (65 Fe) MG TABS Take 325 mg by mouth daily.     [name] glipiZIDE (GLUCOTROL XL) 10 MG 24 hr tablet Take 1 tablet (10 mg total) by mouth daily with breakfast. 90 tablet 3  . glucose blood (TRUE METRIX BLOOD GLUCOSE TEST) test strip TEST ONCE DAILY dx e11.9 100 each 3  . Insulin Glargine (LANTUS SOLOSTAR) 100 UNIT/ML Solostar Pen Inject 10 Units into the skin every morning. HOLD for blood sugar levels below 100 15 mL 11  . Insulin Pen Needle (PEN NEEDLES) 31G X 5 MM MISC 1 each by Does not apply route as directed. Use to inject lantus daily dx E11.65 100  each 5  . Insulin Pen Needle 29G X 5MM MISC 1 each by Does not apply route as directed. 100 each 2  . loratadine (CLARITIN) 10 MG tablet Take 10 mg by mouth every morning.     . lovastatin (MEVACOR) 40 MG tablet TAKE 1 TABLET AT BEDTIME (Patient taking differently: Take 40 mg by mouth at bedtime. ) 90 tablet 0  . omeprazole (PRILOSEC) 40 MG capsule TAKE 1 CAPSULE EVERY DAY (Patient taking differently: Take 40 mg by mouth daily. ) 90 capsule 0  . sertraline (ZOLOFT) 50 MG tablet TAKE 1 TABLET EVERY DAY (Patient taking differently: Take 50 mg by mouth daily. ) 90 tablet 1  . tamsulosin (FLOMAX) 0.4 MG CAPS capsule TAKE 1 CAPSULE EVERY DAY (Patient taking differently: Take 0.4 mg by mouth daily. ) 90 capsule 0  . traMADol (ULTRAM) 50 MG tablet Take 1 tablet (50 mg total) by mouth every 12 (twelve) hours as needed for moderate pain. 30 tablet 0  . Trospium Chloride 60 MG CP24 Take 60 mg by mouth daily.     . Vitamin D, Ergocalciferol, (DRISDOL) 50000 units CAPS capsule Take 1 capsule (50,000 Units total) by mouth every 7 (seven) days. 4 capsule 3  . atenolol (TENORMIN) 50 MG tablet Take 1 tablet (50 mg total) by mouth daily. 90 tablet 1   No current facility-administered medications for this visit.    Facility-Administered Medications Ordered in Other Visits  Medication Dose Route Frequency Provider Last Rate Last Dose  . botulinum toxin Type A (BOTOX) injection 100 Units  100 Units Intramuscular Once McKenzie, [name], MD       Allergies:  Zosyn [piperacillin sod-tazobactam so]; Ace inhibitors; and Vancomycin   Social History: The patient  reports that he has never smoked. He has never used smokeless tobacco. He reports that he does not drink alcohol or use drugs.   ROS:  Please see the history of present illness. Otherwise, complete review of systems is positive for hearing and memory loss.  All other systems are reviewed and negative.   Physical Exam: VS:  BP (!) 147/75   Pulse 97   Ht 5'  10" (1.778 m)   Wt 260 lb 3.2 oz (118 kg)   SpO2 91%   BMI 37.33 kg/m , BMI Body mass index is 37.33 kg/m.  Wt Readings from Last 3 Encounters:  11/17/18 260 lb 3.2 oz (118 kg)  11/10/18 256 lb (116.1 kg)  10/10/18 252 lb (114.3 kg)    General: Elderly male, appears comfortable at rest. HEENT: Conjunctiva and lids normal, oropharynx clear. Neck: Supple, no elevated JVP or carotid bruits, no thyromegaly. Lungs: Decreased breath sounds without wheezing, nonlabored breathing at rest. Cardiac: Regular rate and  rhythm, no S3, soft systolic murmur. Abdomen: Soft, nontender, bowel sounds present. Extremities: Mild lower leg edema, distal pulses 2+. Skin: Warm and dry. Musculoskeletal: No kyphosis. Neuropsychiatric: Alert and oriented x3, affect grossly appropriate.  Hearing loss evident.  ECG: I personally reviewed the tracing from 09/25/2018 which showed a wide-complex tachycardia with right bundle branch block consistent with SVT.  Recent Labwork: 09/21/2018: B Natriuretic Peptide 120.0 09/22/2018: ALT 14; AST 17; TSH 0.441 09/25/2018: Magnesium 2.2 10/05/2018: BUN 20; Creatinine, Ser 1.69; Hemoglobin 10.3; Platelets 270; Potassium 4.9; Sodium 137     Component Value Date/Time   CHOL 112 09/24/2018 0603   TRIG 98 09/24/2018 0603   HDL 27 (L) 09/24/2018 0603   CHOLHDL 4.1 09/24/2018 0603   VLDL 20 09/24/2018 0603   LDLCALC 65 09/24/2018 0603   LDLCALC 85 05/29/2018 0840    Other Studies Reviewed Today:  Echocardiogram 09/22/2018: Study Conclusions  - Procedure narrative: Transthoracic echocardiography. Image   quality was adequate. Intravenous contrast (Definity) was   administered. - Left ventricle: The cavity size was normal. There was mild   concentric hypertrophy. Systolic function was normal. The   estimated ejection fraction was in the range of 60% to 65%. Wall   motion was normal; there were no regional wall motion   abnormalities. Doppler parameters are  consistent with abnormal   left ventricular relaxation (grade 1 diastolic dysfunction). - Ventricular septum: Septal motion showed abnormal function and   dyssynergy. These changes are consistent with intraventricular   conduction delay.  Assessment and Plan:  1.  PSVT, episode diagnosed back in October in the setting of hypoxic respiratory failure.  At that time he was started on Lopressor.  There are concerns that this may be affecting his appetite and taste.  We will switch to atenolol 50 mg daily for now.  Follow-up LVEF 60 to 65% by echocardiogram in October.  2.  Essential hypertension, blood pressure control is adequate today.  3.  Dementia on Aricept.  He has assistance from family.  4.  Mixed hyperlipidemia on Mevacor.  He follows with Dr. Moshe Ryan.  LDL 85 in July.  Current medicines were reviewed with the patient today.  Disposition: Follow-up in 6 months.  Signed, Satira Sark, MD, [facility] 11/17/2018 3:31 PM    [facility] [place], [place], [place] [address] Phone: [phone]; Fax: [phone]

## 2018-11-20 ENCOUNTER — Other Ambulatory Visit: Payer: Self-pay | Admitting: Family Medicine

## 2018-11-20 ENCOUNTER — Encounter (HOSPITAL_COMMUNITY): Payer: Self-pay | Admitting: Emergency Medicine

## 2018-11-20 ENCOUNTER — Emergency Department (HOSPITAL_COMMUNITY)
Admission: EM | Admit: 2018-11-20 | Discharge: 2018-11-21 | Disposition: A | Discharge status: Home / Self Care | Payer: Medicare HMO | Attending: Emergency Medicine | Admitting: Emergency Medicine

## 2018-11-20 DIAGNOSIS — R0689 Other abnormalities of breathing: Secondary | ICD-10-CM | POA: Diagnosis not present

## 2018-11-20 DIAGNOSIS — M6282 Rhabdomyolysis: Secondary | ICD-10-CM | POA: Diagnosis not present

## 2018-11-20 DIAGNOSIS — R531 Weakness: Secondary | ICD-10-CM | POA: Diagnosis not present

## 2018-11-20 DIAGNOSIS — G309 Alzheimer's disease, unspecified: Secondary | ICD-10-CM | POA: Insufficient documentation

## 2018-11-20 DIAGNOSIS — I129 Hypertensive chronic kidney disease with stage 1 through stage 4 chronic kidney disease, or unspecified chronic kidney disease: Secondary | ICD-10-CM | POA: Insufficient documentation

## 2018-11-20 DIAGNOSIS — R0602 Shortness of breath: Secondary | ICD-10-CM | POA: Insufficient documentation

## 2018-11-20 DIAGNOSIS — Z79899 Other long term (current) drug therapy: Secondary | ICD-10-CM | POA: Diagnosis not present

## 2018-11-20 DIAGNOSIS — F0280 Dementia in other diseases classified elsewhere without behavioral disturbance: Secondary | ICD-10-CM | POA: Diagnosis not present

## 2018-11-20 DIAGNOSIS — N400 Enlarged prostate without lower urinary tract symptoms: Secondary | ICD-10-CM | POA: Diagnosis not present

## 2018-11-20 DIAGNOSIS — Z794 Long term (current) use of insulin: Secondary | ICD-10-CM | POA: Diagnosis not present

## 2018-11-20 DIAGNOSIS — W19XXXD Unspecified fall, subsequent encounter: Secondary | ICD-10-CM | POA: Diagnosis not present

## 2018-11-20 DIAGNOSIS — N183 Chronic kidney disease, stage 3 (moderate): Secondary | ICD-10-CM | POA: Diagnosis not present

## 2018-11-20 DIAGNOSIS — R000 Tachycardia, unspecified: Secondary | ICD-10-CM | POA: Diagnosis not present

## 2018-11-20 DIAGNOSIS — M1991 Primary osteoarthritis, unspecified site: Secondary | ICD-10-CM | POA: Diagnosis not present

## 2018-11-20 DIAGNOSIS — W19XXXA Unspecified fall, initial encounter: Secondary | ICD-10-CM

## 2018-11-20 DIAGNOSIS — E119 Type 2 diabetes mellitus without complications: Secondary | ICD-10-CM | POA: Insufficient documentation

## 2018-11-20 DIAGNOSIS — I4510 Unspecified right bundle-branch block: Secondary | ICD-10-CM | POA: Diagnosis not present

## 2018-11-20 DIAGNOSIS — R0902 Hypoxemia: Secondary | ICD-10-CM | POA: Diagnosis not present

## 2018-11-20 DIAGNOSIS — E1122 Type 2 diabetes mellitus with diabetic chronic kidney disease: Secondary | ICD-10-CM | POA: Diagnosis not present

## 2018-11-20 DIAGNOSIS — Y92009 Unspecified place in unspecified non-institutional (private) residence as the place of occurrence of the external cause: Secondary | ICD-10-CM

## 2018-11-20 DIAGNOSIS — S0990XA Unspecified injury of head, initial encounter: Secondary | ICD-10-CM | POA: Diagnosis not present

## 2018-11-20 NOTE — ED Triage Notes (Signed)
Per EMS called out for fall, per patient he was returning from bathroom via walker and fell to floor, denies LOC, and son couldn't get him off floor and ask for him to be brought in for increased weakness.

## 2018-11-21 ENCOUNTER — Emergency Department (HOSPITAL_COMMUNITY): Payer: Medicare HMO

## 2018-11-21 DIAGNOSIS — S0990XA Unspecified injury of head, initial encounter: Secondary | ICD-10-CM | POA: Diagnosis not present

## 2018-11-21 DIAGNOSIS — R531 Weakness: Secondary | ICD-10-CM | POA: Diagnosis not present

## 2018-11-21 DIAGNOSIS — R0602 Shortness of breath: Secondary | ICD-10-CM | POA: Diagnosis not present

## 2018-11-21 LAB — LACTIC ACID, PLASMA
LACTIC ACID, VENOUS: 0.8 mmol/L (ref 0.5–1.9)
Lactic Acid, Venous: 1.4 mmol/L (ref 0.5–1.9)

## 2018-11-21 LAB — CBC WITH DIFFERENTIAL/PLATELET
ABS IMMATURE GRANULOCYTES: 0.04 10*3/uL (ref 0.00–0.07)
BASOS PCT: 0 %
Basophils Absolute: 0 10*3/uL (ref 0.0–0.1)
Eosinophils Absolute: 0 10*3/uL (ref 0.0–0.5)
Eosinophils Relative: 0 %
HCT: 33.3 % — ABNORMAL LOW (ref 39.0–52.0)
Hemoglobin: 10.6 g/dL — ABNORMAL LOW (ref 13.0–17.0)
Immature Granulocytes: 1 %
Lymphocytes Relative: 13 %
Lymphs Abs: 1.1 10*3/uL (ref 0.7–4.0)
MCH: 30.8 pg (ref 26.0–34.0)
MCHC: 31.8 g/dL (ref 30.0–36.0)
MCV: 96.8 fL (ref 80.0–100.0)
MONO ABS: 0.6 10*3/uL (ref 0.1–1.0)
Monocytes Relative: 8 %
Neutro Abs: 6.7 10*3/uL (ref 1.7–7.7)
Neutrophils Relative %: 78 %
Platelets: 198 10*3/uL (ref 150–400)
RBC: 3.44 MIL/uL — ABNORMAL LOW (ref 4.22–5.81)
RDW: 14.6 % (ref 11.5–15.5)
WBC: 8.5 10*3/uL (ref 4.0–10.5)
nRBC: 0 % (ref 0.0–0.2)

## 2018-11-21 LAB — URINALYSIS, ROUTINE W REFLEX MICROSCOPIC
Bacteria, UA: NONE SEEN
Bilirubin Urine: NEGATIVE
Glucose, UA: NEGATIVE mg/dL
Ketones, ur: NEGATIVE mg/dL
Leukocytes, UA: NEGATIVE
Nitrite: NEGATIVE
Protein, ur: NEGATIVE mg/dL
Specific Gravity, Urine: 1.008 (ref 1.005–1.030)
pH: 6 (ref 5.0–8.0)

## 2018-11-21 LAB — COMPREHENSIVE METABOLIC PANEL
ALT: 17 U/L (ref 0–44)
AST: 22 U/L (ref 15–41)
Albumin: 3.1 g/dL — ABNORMAL LOW (ref 3.5–5.0)
Alkaline Phosphatase: 68 U/L (ref 38–126)
Anion gap: 5 (ref 5–15)
BILIRUBIN TOTAL: 0.8 mg/dL (ref 0.3–1.2)
BUN: 14 mg/dL (ref 8–23)
CO2: 23 mmol/L (ref 22–32)
Calcium: 8.1 mg/dL — ABNORMAL LOW (ref 8.9–10.3)
Chloride: 108 mmol/L (ref 98–111)
Creatinine, Ser: 1.48 mg/dL — ABNORMAL HIGH (ref 0.61–1.24)
GFR calc Af Amer: 49 mL/min — ABNORMAL LOW (ref >60)
GFR, EST NON AFRICAN AMERICAN: 43 mL/min — AB (ref >60)
Glucose, Bld: 175 mg/dL — ABNORMAL HIGH (ref 70–99)
Potassium: 4.3 mmol/L (ref 3.5–5.1)
Sodium: 136 mmol/L (ref 135–145)
Total Protein: 7.3 g/dL (ref 6.5–8.1)

## 2018-11-21 LAB — CK: Total CK: 99 U/L (ref 49–397)

## 2018-11-21 LAB — TROPONIN I: Troponin I: <0.03 ng/mL (ref <0.03)

## 2018-11-21 LAB — BRAIN NATRIURETIC PEPTIDE: B NATRIURETIC PEPTIDE 5: 208 pg/mL — AB (ref 0.0–100.0)

## 2018-11-21 NOTE — ED Notes (Signed)
Notified lab of blood draw

## 2018-11-21 NOTE — ED Provider Notes (Signed)
[facility] EMERGENCY DEPARTMENT Provider Note   CSN: [id number] Arrival date & time: 11/20/18  2323  Time seen 12:15 AM   History   Chief Complaint Chief Complaint  Patient presents with  . Weakness    HPI Samuel Ryan is a 84 y.o. male.  HPI patient lives with his son.  He states patient has been getting weaker the past few days.  Tonight patient had come out of the bathroom and was using his walker and he fell in the hallway close to the den.  At that point he was home alone and he was unable to get up for about 2 hours when his son came home.  Son states after they got him up he just still seem diffusely weak.  Son states he eats well but he limits what he eats and that he only wants certain types of things.  He also complains of his taste not being right since he was last admitted.  He was seen by his cardiologist yesterday and was told to stop the metoprolol when it ran out and to start a atenolol.  That has not happened yet.  The son said they did that because he had been extra sleepy and the change in appetite.  Patient has chronic urinary incontinence and son states he gets UTIs easily if he has a catheter.  Patient denies any pain or injury from the fall.  Son also notes that since he fell he seems to be having trouble balancing and he seemed confused at home.  Son also states he is drinking less fluids although the patient feels like he is drinking enough.  Patient does complain of some shortness of breath and son is noted some swelling of his legs last few days.  He does have oxygen to use as needed.  Son also states he is noticed his father is repeating things.  Son states patient has a chronic cough and he also coughs up clear mucus that is chronic.  PCP [name], MD Cardiology Dr Domenic Polite  Past Medical History:  Diagnosis Date  . Acute cholecystitis 11/2012   Drained percutaneously  . Anxiety   . BPH (benign prostatic hypertrophy)   . Deep venous thrombosis  (HCC)    Left leg, diagnosed 7/12  . Dementia [facility])    Needs supervision for safety per son - POA  . Diverticulitis   . Essential hypertension, benign   . GERD (gastroesophageal reflux disease)   . History of pneumonia    Prior to 2012  . Incontinence of urine   . Mixed hyperlipidemia   . Osteoarthritis   . Pulmonary embolism ([place])    Diagnosed 7/12  . Type 2 diabetes mellitus [facility])     Patient Active Problem List   Diagnosis Date Noted  . Knee pain, left 10/10/2018  . History of total knee arthroplasty, right 10/10/2018  . Unilateral primary osteoarthritis, left knee 10/10/2018  . SVT (supraventricular tachycardia) ([place]) 09/25/2018  . Acute metabolic encephalopathy 50/53/9767  . Acute respiratory failure with hypoxia ([place]) 09/22/2018  . Uncontrolled type 2 diabetes mellitus with hyperglycemia ([place]) 09/22/2018  . Dementia without behavioral disturbance ([place]) 09/22/2018  . Fall 08/10/2018  . Rhabdomyolysis 08/03/2018  . Morbid obesity ([place]) 06/12/2018  . Anemia 09/14/2017  . CKD (chronic kidney disease) stage 3, GFR 30-59 ml/min (HCC) 09/14/2017  . Atrial fibrillation ([place]) 09/14/2017  . Hospital discharge follow-up 11/26/2016  . Hypoalbuminemia 11/23/2016  . Hypoxia 10/29/2016  . Urinary frequency 09/17/2015  .  At high risk for falls 03/30/2015  . Generalized osteoarthritis 07/16/2014  . Urinary incontinence 07/16/2014  . Pulmonary interstitial fibrosis ([place]) 07/03/2014  . Abnormal CXR (chest x-ray) 05/27/2014  . Thyroid mass of unclear etiology 05/07/2014  . Vitamin D deficiency 01/31/2014  . Allergic rhinitis 01/31/2014  . Diverticulosis of sigmoid colon 12/13/2012  . Right ventricular dysfunction 07/20/2011  . Heart disease, unspecified 07/20/2011  . Alzheimer's dementia without behavioral disturbance ([place]) 09/12/2010  . Goiter 06/19/2009  . Essential hypertension 12/11/2008  . Diabetes mellitus, insulin dependent (IDDM), controlled ([place]) 09/05/2008  .  Hyperlipidemia with target LDL less than 100 07/19/2008  . BPH (benign prostatic hyperplasia) 03/28/2008    Past Surgical History:  Procedure Laterality Date  . CARPAL TUNNEL RELEASE    . COLONOSCOPY  08/31/2012   Procedure: COLONOSCOPY;  Surgeon: Rogene Houston, MD;  Location: [facility] ENDO SUITE;  Service: Endoscopy;  Laterality: N/A;  830  . CYSTOSCOPY WITH INJECTION N/A 05/15/2018   Procedure: CYSTOSCOPY WITH BOTOX INJECTION;  Surgeon: Cleon Gustin, MD;  Location: [facility] ORS;  Service: Urology;  Laterality: N/A;  . CYSTOSCOPY WITH INJECTION N/A 11/10/2018   Procedure: CYSTOSCOPY WITH BOTOX  INJECTION;  Surgeon: Cleon Gustin, MD;  Location: [facility] ORS;  Service: Urology;  Laterality: N/A;  30 MINS  . CYSTOSCOPY WITH INSERTION OF UROLIFT N/A 09/02/2016   Procedure: CYSTOSCOPY WITH INSERTION OF UROLIFT;  Surgeon: Cleon Gustin, MD;  Location: [facility];  Service: Urology;  Laterality: N/A;  . EYE SURGERY Bilateral    bilateral cataract  . Right total knee replacement  04/23/2011  . SPINE SURGERY  prior to 2012   Neck fusion  . UMBILICAL HERNIA REPAIR     umbilical        Home Medications    Prior to Admission medications   Medication Sig Start Date End Date Taking? Authorizing Provider  acetaminophen (TYLENOL) 500 MG tablet Take 1,000 mg by mouth every 6 (six) hours as needed for mild pain, moderate pain or headache.     [provider]  atenolol (TENORMIN) 50 MG tablet Take 1 tablet (50 mg total) by mouth daily. 11/17/18 02/15/19  Satira Sark, MD  Blood Glucose Monitoring Suppl (TRUE METRIX METER) w/Device KIT With supplies, alcohol swabs as covered by insurance 09/22/18   [name], MD  clobetasol cream (TEMOVATE) 1.02 % APPLY 1 APPLICATION TOPICALLY 2 (TWO) TIMES DAILY. Patient taking differently: Apply 1 application topically 2 (two) times a week.  10/18/18   [name], MD  donepezil (ARICEPT) 10 MG tablet TAKE 1 TABLET AT  BEDTIME 11/20/18   [name], MD  Ferrous Sulfate (IRON) 325 (65 Fe) MG TABS Take 325 mg by mouth daily.     [provider]  glipiZIDE (GLUCOTROL XL) 10 MG 24 hr tablet Take 1 tablet (10 mg total) by mouth daily with breakfast. 06/06/18   [name], MD  glucose blood test strip Once daily testing 11/20/18   [name], MD  Insulin Glargine (LANTUS SOLOSTAR) 100 UNIT/ML Solostar Pen Inject 10 Units into the skin every morning. HOLD for blood sugar levels below 100 06/06/18   [name], MD  Insulin Pen Needle (PEN NEEDLES) 31G X 5 MM MISC 1 each by Does not apply route as directed. Use to inject lantus daily dx E11.65 02/14/18   [name], MD  Insulin Pen Needle 29G X 5MM MISC 1 each by Does not apply route  as directed. 09/22/18   [name], MD  loratadine (CLARITIN) 10 MG tablet Take 10 mg by mouth every morning.     [provider]  lovastatin (MEVACOR) 40 MG tablet TAKE 1 TABLET AT BEDTIME Patient taking differently: Take 40 mg by mouth at bedtime.  10/17/18   [name], MD  omeprazole (PRILOSEC) 40 MG capsule TAKE 1 CAPSULE EVERY DAY Patient taking differently: Take 40 mg by mouth daily.  10/24/18   [name], MD  sertraline (ZOLOFT) 50 MG tablet TAKE 1 TABLET EVERY DAY Patient taking differently: Take 50 mg by mouth daily.  09/19/18   [name], MD  tamsulosin (FLOMAX) 0.4 MG CAPS capsule TAKE 1 CAPSULE EVERY DAY Patient taking differently: Take 0.4 mg by mouth daily.  10/24/18   [name], MD  traMADol (ULTRAM) 50 MG tablet Take 1 tablet (50 mg total) by mouth every 12 (twelve) hours as needed for moderate pain. 10/03/18   Virgie Dad, MD  Trospium Chloride 60 MG CP24 Take 60 mg by mouth daily.  06/02/17   [provider]  Vitamin D, Ergocalciferol, (DRISDOL) 50000 units CAPS capsule Take 1 capsule (50,000 Units total) by mouth every 7 (seven) days. 09/07/18   [name], MD    Family History Family History  Problem Relation Age of Onset  . Cancer Mother        Stomach  . Diabetes Sister   . Hypertension Sister   . Diabetes Brother   . Cancer Brother        Gallbladder  . Seizures Son        due to meningitis as a child    Social History Social History   Tobacco Use  . Smoking status: Never Smoker  . Smokeless tobacco: Never Used  Substance Use Topics  . Alcohol use: No  . Drug use: No  Lives at home With son Uses a walker Uses oxygen as needed   Allergies   Zosyn [piperacillin sod-tazobactam so]; Ace inhibitors; and Vancomycin   Review of Systems Review of Systems  All other systems reviewed and are negative.    Physical Exam Updated Vital Signs BP (!) 145/87   Pulse 100   Temp 99.7 F (37.6 C) (Rectal)   Resp (!) 26   Ht _0  (1.778 m)   Wt 118 kg   SpO2 98%   BMI 37.33 kg/m   Vital signs normal except for borderline tachycardia   Physical Exam Vitals signs and nursing note reviewed.  Constitutional:      General: He is not in acute distress.    Appearance: Normal appearance. He is well-developed. He is not ill-appearing or toxic-appearing.  HENT:     Head: Normocephalic and atraumatic.     Right Ear: External ear normal.     Left Ear: External ear normal.     Nose: Nose normal. No mucosal edema or rhinorrhea.     Mouth/Throat:     Dentition: No dental abscesses.     Pharynx: No uvula swelling.  Eyes:     Conjunctiva/sclera: Conjunctivae normal.     Pupils: Pupils are equal, round, and reactive to light.  Neck:     Musculoskeletal: Full passive range of motion without pain, normal range of motion and neck supple.  Cardiovascular:     Rate and Rhythm: Normal rate and regular rhythm.     Heart sounds: Normal heart sounds. No murmur. No friction rub. No gallop.  Pulmonary:     Effort: Pulmonary effort is normal. No respiratory distress.     Breath sounds: Normal breath sounds. No wheezing,  rhonchi or rales.  Chest:     Chest wall: No tenderness or crepitus.  Abdominal:     General: Bowel sounds are normal. There is no distension.     Palpations: Abdomen is soft.     Tenderness: There is no abdominal tenderness. There is no guarding or rebound.  Musculoskeletal: Normal range of motion.        General: No tenderness.     Right lower leg: Edema present.     Left lower leg: Edema present.     Comments: Patient has some edema around his ankles  Skin:    General: Skin is warm and dry.     Coloration: Skin is not pale.     Findings: No erythema or rash.  Neurological:     Mental Status: He is alert and oriented to person, place, and time.     Cranial Nerves: No cranial nerve deficit.     Comments: Patient is unable to grip my hands due to arthritis, there is no pronator drift, he is unable to left either leg up off the stretcher.  Psychiatric:        Mood and Affect: Mood is not anxious.        Speech: Speech normal.        Behavior: Behavior normal.      ED Treatments / Results  Labs (all labs ordered are listed, but only abnormal results are displayed) Results for orders placed or performed during the hospital encounter of 11/20/18  Comprehensive metabolic panel  Result Value Ref Range   Sodium 136 135 - 145 mmol/L   Potassium 4.3 3.5 - 5.1 mmol/L   Chloride 108 98 - 111 mmol/L   CO2 23 22 - 32 mmol/L   Glucose, Bld 175 (H) 70 - 99 mg/dL   BUN 14 8 - 23 mg/dL   Creatinine, Ser 1.48 (H) 0.61 - 1.24 mg/dL   Calcium 8.1 (L) 8.9 - 10.3 mg/dL   Total Protein 7.3 6.5 - 8.1 g/dL   Albumin 3.1 (L) 3.5 - 5.0 g/dL   AST 22 15 - 41 U/L   ALT 17 0 - 44 U/L   Alkaline Phosphatase 68 38 - 126 U/L   Total Bilirubin 0.8 0.3 - 1.2 mg/dL   GFR calc non Af Amer 43 (L) >60 mL/min   GFR calc Af Amer 49 (L) >60 mL/min   Anion gap 5 5 - 15  CBC with Differential  Result Value Ref Range   WBC 8.5 4.0 - 10.5 K/uL   RBC 3.44 (L) 4.22 - 5.81 MIL/uL   Hemoglobin 10.6 (L) 13.0 -  17.0 g/dL   HCT 33.3 (L) 39.0 - 52.0 %   MCV 96.8 80.0 - 100.0 fL   MCH 30.8 26.0 - 34.0 pg   MCHC 31.8 30.0 - 36.0 g/dL   RDW 14.6 11.5 - 15.5 %   Platelets 198 150 - 400 K/uL   nRBC 0.0 0.0 - 0.2 %   Neutrophils Relative % 78 %   Neutro Abs 6.7 1.7 - 7.7 K/uL   Lymphocytes Relative 13 %   Lymphs Abs 1.1 0.7 - 4.0 K/uL   Monocytes Relative 8 %   Monocytes Absolute 0.6 0.1 - 1.0 K/uL   Eosinophils Relative 0 %   Eosinophils Absolute 0.0 0.0 - 0.5 K/uL   Basophils Relative  0 %   Basophils Absolute 0.0 0.0 - 0.1 K/uL   Immature Granulocytes 1 %   Abs Immature Granulocytes 0.04 0.00 - 0.07 K/uL  Urinalysis, Routine w reflex microscopic  Result Value Ref Range   Color, Urine YELLOW YELLOW   APPearance CLEAR CLEAR   Specific Gravity, Urine 1.008 1.005 - 1.030   pH 6.0 5.0 - 8.0   Glucose, UA NEGATIVE NEGATIVE mg/dL   Hgb urine dipstick SMALL (A) NEGATIVE   Bilirubin Urine NEGATIVE NEGATIVE   Ketones, ur NEGATIVE NEGATIVE mg/dL   Protein, ur NEGATIVE NEGATIVE mg/dL   Nitrite NEGATIVE NEGATIVE   Leukocytes, UA NEGATIVE NEGATIVE   RBC / HPF 0-5 0 - 5 RBC/hpf   Bacteria, UA NONE SEEN NONE SEEN   Mucus PRESENT   Lactic acid, plasma  Result Value Ref Range   Lactic Acid, Venous 0.8 0.5 - 1.9 mmol/L  Lactic acid, plasma  Result Value Ref Range   Lactic Acid, Venous 1.4 0.5 - 1.9 mmol/L  Troponin I - Once  Result Value Ref Range   Troponin I <0.03 <0.03 ng/mL  CK  Result Value Ref Range   Total CK 99 49 - 397 U/L  Brain natriuretic peptide  Result Value Ref Range   B Natriuretic Peptide 208.0 (H) 0.0 - 100.0 pg/mL   Laboratory interpretation all normal except minimal elevation of BNP, improvement of his chronic renal insufficiency although still present, stable anemia    EKG EKG Interpretation  Date/Time:  Monday November 20 2018 23:34:46 EST Ventricular Rate:  102 PR Interval:    QRS Duration: 152 QT Interval:  377 QTC Calculation: 492 R Axis:   37 Text  Interpretation:  Sinus tachycardia Consider right atrial enlargement Right bundle branch block No significant change since last tracing 25 Sep 2018 Confirmed by Rolland Porter [phone]) on 11/20/2018 11:47:33 PM   Radiology Ct Head Wo Contrast  Result Date: 11/21/2018 CLINICAL DATA:  Recent fall EXAM: CT HEAD WITHOUT CONTRAST TECHNIQUE: Contiguous axial images were obtained from the base of the skull through the vertex without intravenous contrast. COMPARISON:  09/21/2018 FINDINGS: Brain: Chronic atrophic and ischemic changes are noted. No findings to suggest acute hemorrhage, acute infarction or space-occupying mass lesion are noted. Vascular: No hyperdense vessel or unexpected calcification. Skull: Normal. Negative for fracture or focal lesion. Sinuses/Orbits: No acute finding. Other: None. IMPRESSION: Chronic atrophic and ischemic changes without acute abnormality. Electronically Signed   By: Inez Catalina M.D.   On: 11/21/2018 01:45   Dg Chest Port 1 View  Result Date: 11/21/2018 CLINICAL DATA:  Weakness and shortness of breath. History of pneumonia and pulmonary embolism. EXAM: PORTABLE CHEST 1 VIEW COMPARISON:  CT chest September 23, 2018 and chest radiograph September 21, 2018 FINDINGS: Cardiac silhouette is mildly enlarged and unchanged. Mildly calcified aortic arch. Pulmonary vascular congestion. Bibasilar strandy densities are similar. No pleural effusion or focal consolidation. No pneumothorax. ACDF. IMPRESSION: 1. Stable cardiomegaly and pulmonary vascular congestion. Bibasilar atelectasis/scarring. 2.  Aortic Atherosclerosis (ICD10-I70.0). Electronically Signed   By: Elon Alas M.D.   On: 11/21/2018 02:31    Procedures Procedures (including critical care time)  Medications Ordered in ED Medications - No data to display   Initial Impression / Assessment and Plan / ED Course  I have reviewed the triage vital signs and the nursing notes.  Pertinent labs & imaging results that were  available during my care of the patient were reviewed by me and considered in my medical decision making (see chart  for details).   Laboratory testing was done to evaluate his weakness.  Head CT was done to look for an acute change to indicate a stroke or subdural or other intracranial acute change.  Patient's urinalysis was done to look for UTI as a source of his weakness.  Due to his shortness of breath chest x-ray and BMP was done.  He was just seen by the cardiologist yesterday and had a medication change recommended.  4:00 I rechecked patient and discussed the test results with patient and his son.  Son states the weakness just got worse today.  I am going to have nurses see if they can ambulate him in the room.  My thought is that he had struggle to get up for couple hours when he fell at home and he was probably just exhausted when they did get them up.  Son did not indicate he felt like he was unable to care for his father any longer and did not express interest in nursing home placement.  Nursing staff state patient was stiff getting up however he was able to get up and ambulate in the room.  At this point patient was felt stable to be discharged home.   Final Clinical Impressions(s) / ED Diagnoses   Final diagnoses:  Generalized weakness  Fall in home, initial encounter    ED Discharge Orders    None     Plan discharge  Rolland Porter, MD, Barbette Or, MD 11/21/18 [phone]

## 2018-11-21 NOTE — Discharge Instructions (Addendum)
Try to encourage him to eat and drink better.  Recheck if he seems to be getting worse.

## 2018-11-23 ENCOUNTER — Telehealth: Payer: Self-pay | Admitting: Family Medicine

## 2018-11-23 NOTE — Telephone Encounter (Signed)
Pt son would like to know if we can use the same labs from the ER visit for Dr Camillia Herter visit.  Please call the pt son [phone]

## 2018-11-23 NOTE — Telephone Encounter (Signed)
Spoke with son and asked him to please have the labs drawn if for nothing else to have a comparison of where his father was at when he went to the ED and now. He verbalized understanding.

## 2018-11-24 DIAGNOSIS — N183 Chronic kidney disease, stage 3 (moderate): Secondary | ICD-10-CM | POA: Diagnosis not present

## 2018-11-24 DIAGNOSIS — M1991 Primary osteoarthritis, unspecified site: Secondary | ICD-10-CM | POA: Diagnosis not present

## 2018-11-24 DIAGNOSIS — N400 Enlarged prostate without lower urinary tract symptoms: Secondary | ICD-10-CM | POA: Diagnosis not present

## 2018-11-24 DIAGNOSIS — Z794 Long term (current) use of insulin: Secondary | ICD-10-CM | POA: Diagnosis not present

## 2018-11-24 DIAGNOSIS — I10 Essential (primary) hypertension: Secondary | ICD-10-CM | POA: Diagnosis not present

## 2018-11-24 DIAGNOSIS — G309 Alzheimer's disease, unspecified: Secondary | ICD-10-CM | POA: Diagnosis not present

## 2018-11-24 DIAGNOSIS — E1122 Type 2 diabetes mellitus with diabetic chronic kidney disease: Secondary | ICD-10-CM | POA: Diagnosis not present

## 2018-11-24 DIAGNOSIS — E119 Type 2 diabetes mellitus without complications: Secondary | ICD-10-CM | POA: Diagnosis not present

## 2018-11-24 DIAGNOSIS — I129 Hypertensive chronic kidney disease with stage 1 through stage 4 chronic kidney disease, or unspecified chronic kidney disease: Secondary | ICD-10-CM | POA: Diagnosis not present

## 2018-11-24 DIAGNOSIS — F0280 Dementia in other diseases classified elsewhere without behavioral disturbance: Secondary | ICD-10-CM | POA: Diagnosis not present

## 2018-11-24 DIAGNOSIS — W19XXXD Unspecified fall, subsequent encounter: Secondary | ICD-10-CM | POA: Diagnosis not present

## 2018-11-24 DIAGNOSIS — M6282 Rhabdomyolysis: Secondary | ICD-10-CM | POA: Diagnosis not present

## 2018-11-25 LAB — BASIC METABOLIC PANEL WITH GFR
BUN/Creatinine Ratio: 8 (calc) (ref 6–22)
BUN: 15 mg/dL (ref 7–25)
CALCIUM: 8.7 mg/dL (ref 8.6–10.3)
CHLORIDE: 103 mmol/L (ref 98–110)
CO2: 27 mmol/L (ref 20–32)
CREATININE: 1.77 mg/dL — AB (ref 0.70–1.11)
GFR, Est African American: 40 mL/min/{1.73_m2} — ABNORMAL LOW (ref >=60)
GFR, Est Non African American: 34 mL/min/{1.73_m2} — ABNORMAL LOW (ref >=60)
GLUCOSE: 168 mg/dL — AB (ref 65–139)
Potassium: 4.6 mmol/L (ref 3.5–5.3)
Sodium: 138 mmol/L (ref 135–146)

## 2018-11-25 LAB — HEMOGLOBIN A1C
EAG (MMOL/L): 11.2 (calc)
Hgb A1c MFr Bld: 8.7 % of total Hgb — ABNORMAL HIGH (ref <5.7)
Mean Plasma Glucose: 203 (calc)

## 2018-11-27 DIAGNOSIS — N400 Enlarged prostate without lower urinary tract symptoms: Secondary | ICD-10-CM | POA: Diagnosis not present

## 2018-11-27 DIAGNOSIS — G309 Alzheimer's disease, unspecified: Secondary | ICD-10-CM | POA: Diagnosis not present

## 2018-11-27 DIAGNOSIS — F0280 Dementia in other diseases classified elsewhere without behavioral disturbance: Secondary | ICD-10-CM | POA: Diagnosis not present

## 2018-11-27 DIAGNOSIS — M1991 Primary osteoarthritis, unspecified site: Secondary | ICD-10-CM | POA: Diagnosis not present

## 2018-11-27 DIAGNOSIS — W19XXXD Unspecified fall, subsequent encounter: Secondary | ICD-10-CM | POA: Diagnosis not present

## 2018-11-27 DIAGNOSIS — N183 Chronic kidney disease, stage 3 (moderate): Secondary | ICD-10-CM | POA: Diagnosis not present

## 2018-11-27 DIAGNOSIS — E1122 Type 2 diabetes mellitus with diabetic chronic kidney disease: Secondary | ICD-10-CM | POA: Diagnosis not present

## 2018-11-27 DIAGNOSIS — M6282 Rhabdomyolysis: Secondary | ICD-10-CM | POA: Diagnosis not present

## 2018-11-27 DIAGNOSIS — I129 Hypertensive chronic kidney disease with stage 1 through stage 4 chronic kidney disease, or unspecified chronic kidney disease: Secondary | ICD-10-CM | POA: Diagnosis not present

## 2018-11-28 ENCOUNTER — Ambulatory Visit (INDEPENDENT_AMBULATORY_CARE_PROVIDER_SITE_OTHER): Payer: Medicare HMO | Admitting: Family Medicine

## 2018-11-28 ENCOUNTER — Encounter: Payer: Self-pay | Admitting: Family Medicine

## 2018-11-28 VITALS — BP 128/78 | HR 93 | Resp 15 | Ht 70.0 in | Wt 258.0 lb

## 2018-11-28 DIAGNOSIS — E785 Hyperlipidemia, unspecified: Secondary | ICD-10-CM

## 2018-11-28 DIAGNOSIS — N3000 Acute cystitis without hematuria: Secondary | ICD-10-CM

## 2018-11-28 DIAGNOSIS — IMO0002 Reserved for concepts with insufficient information to code with codable children: Secondary | ICD-10-CM

## 2018-11-28 DIAGNOSIS — Z9181 History of falling: Secondary | ICD-10-CM

## 2018-11-28 DIAGNOSIS — E1165 Type 2 diabetes mellitus with hyperglycemia: Secondary | ICD-10-CM

## 2018-11-28 DIAGNOSIS — Z0000 Encounter for general adult medical examination without abnormal findings: Secondary | ICD-10-CM | POA: Insufficient documentation

## 2018-11-28 DIAGNOSIS — Z794 Long term (current) use of insulin: Secondary | ICD-10-CM

## 2018-11-28 DIAGNOSIS — E559 Vitamin D deficiency, unspecified: Secondary | ICD-10-CM

## 2018-11-28 DIAGNOSIS — E1121 Type 2 diabetes mellitus with diabetic nephropathy: Secondary | ICD-10-CM

## 2018-11-28 DIAGNOSIS — M79642 Pain in left hand: Secondary | ICD-10-CM

## 2018-11-28 DIAGNOSIS — M79641 Pain in right hand: Secondary | ICD-10-CM | POA: Insufficient documentation

## 2018-11-28 NOTE — Progress Notes (Signed)
Samuel Ryan     MRN: [id number]      DOB: 1931-03-29   HPI: Patient is in for annual physical exam. [place] at home 11/20/2018, has unsteady gait, needed ed eval 5 day h/ urinary frequency and malodorous urine, no fever or chills  Recent labs,  are reviewed.No med changes made Immunization is reviewed , and  Is up to date   PE;BP 128/78   Pulse 93   Resp 15   Ht 5\' 10"  (1.778 m)   Wt 258 lb (117 kg)   SpO2 90%   BMI 37.02 kg/m  Pleasant male, alert and oriented x 3, in no cardio-pulmonary distress. Afebrile. HEENT No facial trauma or asymetry. Sinuses non tender. EOMI,t. External ears normal, tympanic membranes clear. Oropharynx moist, no exudate. Neck: decreased ROM, no adenopathy,JVD positive  thyromegaly.No bruits.  Chest: Clear Few scattered basilar bilateral  crackles no wheezes.decreased air entry Non tender to palpation  Cardiovascular system; Heart sounds normal,  S1 and  S2 ,no S3.  No murmur, or thrill. Apical beat not displaced Peripheral pulses normal.  Abdomen: Soft, non tender, no organomegaly or masses. No bruits. Bowel sounds normal. No guarding, tenderness or rebound.      Musculoskeletal exam: Markedly reduced  ROM of spine, hips , shoulders and knees.  deformity ,swelling and  crepitus noted. No muscle wasting or atrophy.   Neurologic: Cranial nerves 2 to 12 intact. Power, tone ,sensation and reflexes normal throughout. No disturbance in gait. No tremor.  Skin: Intact, no ulceration, erythema , scaling or rash noted. Pigmentation normal throughout  Psych; Normal mood and affect. Judgement and concentration impaired from demential   Assessment & Plan:  Annual physical exam Annual exam as documented. Counseling done  re healthy lifestyle involving commitment to 150 minutes exercise per week, heart healthy diet, and attaining healthy weight.The importance of adequate sleep also discussed. Regular seat belt use and home safety, is  also discussed. Changes in health habits are decided on by the patient with goals and time frames  set for achieving them. Immunization and cancer screening needs are specifically addressed at this visit.   At high risk for falls Severe osteoarthritis at h/o and continued repeated falls, places pt at high fall risk, recently fell in past 1 month in the home, requiring Ed visit, states his " legs just gave out" PT to eval and treat in home twice weekly for 6 weeks, has benefited from this in the past  Uncontrolled diabetes mellitus with diabetic nephropathy, with long-term current use of insulin [facility]) Samuel Ryan is reminded of the importance of commitment to daily physical activity for 30 minutes or more, as able and the need to limit carbohydrate intake to 30 to 60 grams per meal to help with blood sugar control.   The need to take medication as prescribed, test blood sugar as directed, and to call between visits if there is a concern that blood sugar is uncontrolled is also discussed.   Samuel Ryan is reminded of the importance of daily foot exam, annual eye examination, and good blood sugar, blood pressure and cholesterol control. Deteriorated due to dietary indiscretion, medication adjustment was made in December, fasting sugars noted to be normal , but son states father eats in a less controled way during the day and as a result blood sugar uncontrolled will work on this and tesat more often than once daily Diabetic Labs Latest Ref Rng & Units 11/30/2018 11/24/2018 11/21/2018 10/05/2018 09/26/2018  HbA1c <5.7 %  of total Hgb - 8.7(H) - - -  Microalbumin mg/dL 0.8 - - - -  Micro/Creat Ratio <30 mcg/mg creat 9 - - - -  Chol 0 - 200 mg/dL - - - - -  HDL >40 mg/dL - - - - -  Calc LDL 0 - 99 mg/dL - - - - -  Triglycerides <150 mg/dL - - - - -  Creatinine 0.70 - 1.11 mg/dL - 1.77(H) 1.48(H) 1.69(H) 1.80(H)   BP/Weight 11/28/2018 11/21/2018 11/20/2018 11/17/2018 11/10/2018 10/10/2018 33/43/5686    Systolic BP 168 372 - 902 111 552 080  Diastolic BP 78 87 - 75 88 80 80  Wt. (Lbs) 258 - 260.2 260.2 256 252 252  BMI 37.02 - 37.33 37.33 36.73 36.16 36.16   Foot/eye exam completion dates Latest Ref Rng & Units 01/24/2018 09/22/2015  Eye Exam No Retinopathy - No Retinopathy  Foot Form Completion - Done -         Acute cystitis without hematuria Symptomatic will send urine prior to prescribing antibiotic , unable to void at  Visit , but son will turn in sample asap  Morbid obesity ([place]) Unchanged. Patient re-educated about  the importance of commitment to a  minimum of 150 minutes of exercise per week.  The importance of healthy food choices with portion control discussed.  Weight /BMI 11/28/2018 11/20/2018 11/17/2018  WEIGHT 258 lb 260 lb 3.2 oz 260 lb 3.2 oz  HEIGHT 5\' 10"  5\' 10"  5\' 10"   BMI 37.02 kg/m2 37.33 kg/m2 37.33 kg/m2

## 2018-11-28 NOTE — Patient Instructions (Addendum)
F/U in 3.5  months, call if you need me before time to be tested for infection, UA and C/S if needed if able to void in office today, please, also for protein in the urine`  Urine to be sent for microalb lso  You will be referred for in home pT due to fall and imbalance twice weekly for 4 weeks  Please get eye exam done this is overdue   Please stop ice cream sandwiches and sugary foods, blood sugar has increased   Need fasting lipid, cmp and eGFR, HBA1C and vit d level 1 week before next visit        Fall Prevention in the Home, Adult Falls can cause injuries. They can happen to people of all ages. There are many things you can do to make your home safe and to help prevent falls. Ask for help when making these changes, if needed. What actions can I take to prevent falls? General Instructions  Use good lighting in all rooms. Replace any light bulbs that burn out.  Turn on the lights when you go into a dark area. Use night-lights.  Keep items that you use often in easy-to-reach places. Lower the shelves around your home if necessary.  Set up your furniture so you have a clear path. Avoid moving your furniture around.  Do not have throw rugs and other things on the floor that can make you trip.  Avoid walking on wet floors.  If any of your floors are uneven, fix them.  Add color or contrast paint or tape to clearly mark and help you see: ? Any grab bars or handrails. ? First and last steps of stairways. ? Where the edge of each step is.  If you use a stepladder: ? Make sure that it is fully opened. Do not climb a closed stepladder. ? Make sure that both sides of the stepladder are locked into place. ? Ask someone to hold the stepladder for you while you use it.  If there are any pets around you, be aware of where they are. What can I do in the bathroom?      Keep the floor dry. Clean up any water that spills onto the floor as soon as it happens.  Remove soap  buildup in the tub or shower regularly.  Use non-skid mats or decals on the floor of the tub or shower.  Attach bath mats securely with double-sided, non-slip rug tape.  If you need to sit down in the shower, use a plastic, non-slip stool.  Install grab bars by the toilet and in the tub and shower. Do not use towel bars as grab bars. What can I do in the bedroom?  Make sure that you have a light by your bed that is easy to reach.  Do not use any sheets or blankets that are too big for your bed. They should not hang down onto the floor.  Have a firm chair that has side arms. You can use this for support while you get dressed. What can I do in the kitchen?  Clean up any spills right away.  If you need to reach something above you, use a strong step stool that has a grab bar.  Keep electrical cords out of the way.  Do not use floor polish or wax that makes floors slippery. If you must use wax, use non-skid floor wax. What can I do with my stairs?  Do not leave any items on the stairs.  Make sure that you have a light switch at the top of the stairs and the bottom of the stairs. If you do not have them, ask someone to add them for you.  Make sure that there are handrails on both sides of the stairs, and use them. Fix handrails that are broken or loose. Make sure that handrails are as long as the stairways.  Install non-slip stair treads on all stairs in your home.  Avoid having throw rugs at the top or bottom of the stairs. If you do have throw rugs, attach them to the floor with carpet tape.  Choose a carpet that does not hide the edge of the steps on the stairway.  Check any carpeting to make sure that it is firmly attached to the stairs. Fix any carpet that is loose or worn. What can I do on the outside of my home?  Use bright outdoor lighting.  Regularly fix the edges of walkways and driveways and fix any cracks.  Remove anything that might make you trip as you walk  through a door, such as a raised step or threshold.  Trim any bushes or trees on the path to your home.  Regularly check to see if handrails are loose or broken. Make sure that both sides of any steps have handrails.  Install guardrails along the edges of any raised decks and porches.  Clear walking paths of anything that might make someone trip, such as tools or rocks.  Have any leaves, snow, or ice cleared regularly.  Use sand or salt on walking paths during winter.  Clean up any spills in your garage right away. This includes grease or oil spills. What other actions can I take?  Wear shoes that: ? Have a low heel. Do not wear high heels. ? Have rubber bottoms. ? Are comfortable and fit you well. ? Are closed at the toe. Do not wear open-toe sandals.  Use tools that help you move around (mobility aids) if they are needed. These include: ? Canes. ? Walkers. ? Scooters. ? Crutches.  Review your medicines with your doctor. Some medicines can make you feel dizzy. This can increase your chance of falling. Ask your doctor what other things you can do to help prevent falls. Where to find more information  Centers for Disease Control and Prevention, STEADI: https://garcia.biz/  [employer] on Aging: BrainJudge.co.uk Contact a doctor if:  You are afraid of falling at home.  You feel weak, drowsy, or dizzy at home.  You fall at home. Summary  There are many simple things that you can do to make your home safe and to help prevent falls.  Ways to make your home safe include removing tripping hazards and installing grab bars in the bathroom.  Ask for help when making these changes in your home. This information is not intended to replace advice given to you by your health care provider. Make sure you discuss any questions you have with your health care provider. Document Released: 09/11/2009 Document Revised: 06/30/2017 Document Reviewed: 06/30/2017 Elsevier  Interactive Patient Education  2019 [employer].

## 2018-11-28 NOTE — Assessment & Plan Note (Signed)

## 2018-11-30 ENCOUNTER — Encounter: Payer: Self-pay | Admitting: *Deleted

## 2018-11-30 DIAGNOSIS — F0280 Dementia in other diseases classified elsewhere without behavioral disturbance: Secondary | ICD-10-CM | POA: Diagnosis not present

## 2018-11-30 DIAGNOSIS — W19XXXD Unspecified fall, subsequent encounter: Secondary | ICD-10-CM | POA: Diagnosis not present

## 2018-11-30 DIAGNOSIS — M6282 Rhabdomyolysis: Secondary | ICD-10-CM | POA: Diagnosis not present

## 2018-11-30 DIAGNOSIS — E1122 Type 2 diabetes mellitus with diabetic chronic kidney disease: Secondary | ICD-10-CM | POA: Diagnosis not present

## 2018-11-30 DIAGNOSIS — M1991 Primary osteoarthritis, unspecified site: Secondary | ICD-10-CM | POA: Diagnosis not present

## 2018-11-30 DIAGNOSIS — Z794 Long term (current) use of insulin: Secondary | ICD-10-CM | POA: Diagnosis not present

## 2018-11-30 DIAGNOSIS — N3000 Acute cystitis without hematuria: Secondary | ICD-10-CM | POA: Diagnosis not present

## 2018-11-30 DIAGNOSIS — E119 Type 2 diabetes mellitus without complications: Secondary | ICD-10-CM | POA: Diagnosis not present

## 2018-11-30 DIAGNOSIS — I129 Hypertensive chronic kidney disease with stage 1 through stage 4 chronic kidney disease, or unspecified chronic kidney disease: Secondary | ICD-10-CM | POA: Diagnosis not present

## 2018-11-30 DIAGNOSIS — G309 Alzheimer's disease, unspecified: Secondary | ICD-10-CM | POA: Diagnosis not present

## 2018-11-30 DIAGNOSIS — N400 Enlarged prostate without lower urinary tract symptoms: Secondary | ICD-10-CM | POA: Diagnosis not present

## 2018-11-30 DIAGNOSIS — N183 Chronic kidney disease, stage 3 (moderate): Secondary | ICD-10-CM | POA: Diagnosis not present

## 2018-12-01 ENCOUNTER — Telehealth: Payer: Self-pay | Admitting: *Deleted

## 2018-12-01 LAB — URINALYSIS
BILIRUBIN URINE: NEGATIVE
Glucose, UA: NEGATIVE
Hgb urine dipstick: NEGATIVE
Ketones, ur: NEGATIVE
Leukocytes, UA: NEGATIVE
Nitrite: POSITIVE — AB
Protein, ur: NEGATIVE
Specific Gravity, Urine: 1.012 (ref 1.001–1.03)
pH: 6.5 (ref 5.0–8.0)

## 2018-12-01 LAB — MICROALBUMIN / CREATININE URINE RATIO
Creatinine, Urine: 91 mg/dL (ref 20–320)
MICROALB/CREAT RATIO: 9 ug/mg{creat} (ref <30)
Microalb, Ur: 0.8 mg/dL

## 2018-12-01 LAB — URINE CULTURE
MICRO NUMBER:: 4975
SPECIMEN QUALITY:: ADEQUATE

## 2018-12-01 NOTE — Telephone Encounter (Signed)
Burt from [facility] needed a verbal order to pick up Mr. Gallardo twice a week for 3 weeks for OT.

## 2018-12-01 NOTE — Telephone Encounter (Signed)
Verbal order given  

## 2018-12-02 ENCOUNTER — Encounter: Payer: Self-pay | Admitting: Family Medicine

## 2018-12-02 DIAGNOSIS — G308 Other Alzheimer's disease: Secondary | ICD-10-CM | POA: Diagnosis not present

## 2018-12-02 DIAGNOSIS — E1129 Type 2 diabetes mellitus with other diabetic kidney complication: Secondary | ICD-10-CM | POA: Diagnosis not present

## 2018-12-02 DIAGNOSIS — N3000 Acute cystitis without hematuria: Secondary | ICD-10-CM | POA: Insufficient documentation

## 2018-12-02 DIAGNOSIS — R0902 Hypoxemia: Secondary | ICD-10-CM | POA: Diagnosis not present

## 2018-12-02 DIAGNOSIS — F329 Major depressive disorder, single episode, unspecified: Secondary | ICD-10-CM | POA: Diagnosis not present

## 2018-12-02 NOTE — Assessment & Plan Note (Signed)
Unchanged. Patient re-educated about  the importance of commitment to a  minimum of 150 minutes of exercise per week.  The importance of healthy food choices with portion control discussed.  Weight /BMI 11/28/2018 11/20/2018 11/17/2018  WEIGHT 258 lb 260 lb 3.2 oz 260 lb 3.2 oz  HEIGHT 5\' 10"  5\' 10"  5\' 10"   BMI 37.02 kg/m2 37.33 kg/m2 37.33 kg/m2

## 2018-12-02 NOTE — Assessment & Plan Note (Signed)
Symptomatic will send urine prior to prescribing antibiotic , unable to void at  Visit , but son will turn in sample asap

## 2018-12-02 NOTE — Assessment & Plan Note (Signed)
Severe osteoarthritis at h/o and continued repeated falls, places pt at high fall risk, recently fell in past 1 month in the home, requiring Ed visit, states his " legs just gave out" PT to eval and treat in home twice weekly for 6 weeks, has benefited from this in the past

## 2018-12-02 NOTE — Assessment & Plan Note (Signed)
Samuel Ryan is reminded of the importance of commitment to daily physical activity for 30 minutes or more, as able and the need to limit carbohydrate intake to 30 to 60 grams per meal to help with blood sugar control.   The need to take medication as prescribed, test blood sugar as directed, and to call between visits if there is a concern that blood sugar is uncontrolled is also discussed.   Samuel Ryan is reminded of the importance of daily foot exam, annual eye examination, and good blood sugar, blood pressure and cholesterol control. Deteriorated due to dietary indiscretion, medication adjustment was made in December, fasting sugars noted to be normal , but son states father eats in a less controled way during the day and as a result blood sugar uncontrolled will work on this and tesat more often than once daily Diabetic Labs Latest Ref Rng & Units 11/30/2018 11/24/2018 11/21/2018 10/05/2018 09/26/2018  HbA1c <5.7 % of total Hgb - 8.7(H) - - -  Microalbumin mg/dL 0.8 - - - -  Micro/Creat Ratio <30 mcg/mg creat 9 - - - -  Chol 0 - 200 mg/dL - - - - -  HDL >40 mg/dL - - - - -  Calc LDL 0 - 99 mg/dL - - - - -  Triglycerides <150 mg/dL - - - - -  Creatinine 0.70 - 1.11 mg/dL - 1.77(H) 1.48(H) 1.69(H) 1.80(H)   BP/Weight 11/28/2018 11/21/2018 11/20/2018 11/17/2018 11/10/2018 10/10/2018 29/56/2130  Systolic BP 865 784 - 696 295 284 132  Diastolic BP 78 87 - 75 88 80 80  Wt. (Lbs) 258 - 260.2 260.2 256 252 252  BMI 37.02 - 37.33 37.33 36.73 36.16 36.16   Foot/eye exam completion dates Latest Ref Rng & Units 01/24/2018 09/22/2015  Eye Exam No Retinopathy - No Retinopathy  Foot Form Completion - Done -

## 2018-12-05 DIAGNOSIS — E1122 Type 2 diabetes mellitus with diabetic chronic kidney disease: Secondary | ICD-10-CM | POA: Diagnosis not present

## 2018-12-05 DIAGNOSIS — N183 Chronic kidney disease, stage 3 (moderate): Secondary | ICD-10-CM | POA: Diagnosis not present

## 2018-12-05 DIAGNOSIS — M6282 Rhabdomyolysis: Secondary | ICD-10-CM | POA: Diagnosis not present

## 2018-12-05 DIAGNOSIS — G309 Alzheimer's disease, unspecified: Secondary | ICD-10-CM | POA: Diagnosis not present

## 2018-12-05 DIAGNOSIS — M1991 Primary osteoarthritis, unspecified site: Secondary | ICD-10-CM | POA: Diagnosis not present

## 2018-12-05 DIAGNOSIS — N400 Enlarged prostate without lower urinary tract symptoms: Secondary | ICD-10-CM | POA: Diagnosis not present

## 2018-12-05 DIAGNOSIS — W19XXXD Unspecified fall, subsequent encounter: Secondary | ICD-10-CM | POA: Diagnosis not present

## 2018-12-05 DIAGNOSIS — I129 Hypertensive chronic kidney disease with stage 1 through stage 4 chronic kidney disease, or unspecified chronic kidney disease: Secondary | ICD-10-CM | POA: Diagnosis not present

## 2018-12-05 DIAGNOSIS — F0280 Dementia in other diseases classified elsewhere without behavioral disturbance: Secondary | ICD-10-CM | POA: Diagnosis not present

## 2018-12-07 DIAGNOSIS — Z9181 History of falling: Secondary | ICD-10-CM

## 2018-12-07 DIAGNOSIS — M1991 Primary osteoarthritis, unspecified site: Secondary | ICD-10-CM

## 2018-12-07 DIAGNOSIS — N400 Enlarged prostate without lower urinary tract symptoms: Secondary | ICD-10-CM

## 2018-12-07 DIAGNOSIS — E1122 Type 2 diabetes mellitus with diabetic chronic kidney disease: Secondary | ICD-10-CM | POA: Diagnosis not present

## 2018-12-07 DIAGNOSIS — I129 Hypertensive chronic kidney disease with stage 1 through stage 4 chronic kidney disease, or unspecified chronic kidney disease: Secondary | ICD-10-CM

## 2018-12-07 DIAGNOSIS — N183 Chronic kidney disease, stage 3 (moderate): Secondary | ICD-10-CM

## 2018-12-07 DIAGNOSIS — Z96651 Presence of right artificial knee joint: Secondary | ICD-10-CM

## 2018-12-07 DIAGNOSIS — G309 Alzheimer's disease, unspecified: Secondary | ICD-10-CM | POA: Diagnosis not present

## 2018-12-07 DIAGNOSIS — M6282 Rhabdomyolysis: Secondary | ICD-10-CM | POA: Diagnosis not present

## 2018-12-07 DIAGNOSIS — F0280 Dementia in other diseases classified elsewhere without behavioral disturbance: Secondary | ICD-10-CM | POA: Diagnosis not present

## 2018-12-08 DIAGNOSIS — I129 Hypertensive chronic kidney disease with stage 1 through stage 4 chronic kidney disease, or unspecified chronic kidney disease: Secondary | ICD-10-CM | POA: Diagnosis not present

## 2018-12-08 DIAGNOSIS — M1991 Primary osteoarthritis, unspecified site: Secondary | ICD-10-CM | POA: Diagnosis not present

## 2018-12-08 DIAGNOSIS — M6282 Rhabdomyolysis: Secondary | ICD-10-CM | POA: Diagnosis not present

## 2018-12-08 DIAGNOSIS — E1122 Type 2 diabetes mellitus with diabetic chronic kidney disease: Secondary | ICD-10-CM | POA: Diagnosis not present

## 2018-12-08 DIAGNOSIS — N183 Chronic kidney disease, stage 3 (moderate): Secondary | ICD-10-CM | POA: Diagnosis not present

## 2018-12-08 DIAGNOSIS — N400 Enlarged prostate without lower urinary tract symptoms: Secondary | ICD-10-CM | POA: Diagnosis not present

## 2018-12-08 DIAGNOSIS — G309 Alzheimer's disease, unspecified: Secondary | ICD-10-CM | POA: Diagnosis not present

## 2018-12-08 DIAGNOSIS — F0280 Dementia in other diseases classified elsewhere without behavioral disturbance: Secondary | ICD-10-CM | POA: Diagnosis not present

## 2018-12-08 DIAGNOSIS — W19XXXD Unspecified fall, subsequent encounter: Secondary | ICD-10-CM | POA: Diagnosis not present

## 2018-12-11 DIAGNOSIS — W19XXXD Unspecified fall, subsequent encounter: Secondary | ICD-10-CM | POA: Diagnosis not present

## 2018-12-11 DIAGNOSIS — G309 Alzheimer's disease, unspecified: Secondary | ICD-10-CM | POA: Diagnosis not present

## 2018-12-11 DIAGNOSIS — N183 Chronic kidney disease, stage 3 (moderate): Secondary | ICD-10-CM | POA: Diagnosis not present

## 2018-12-11 DIAGNOSIS — I129 Hypertensive chronic kidney disease with stage 1 through stage 4 chronic kidney disease, or unspecified chronic kidney disease: Secondary | ICD-10-CM | POA: Diagnosis not present

## 2018-12-11 DIAGNOSIS — F0280 Dementia in other diseases classified elsewhere without behavioral disturbance: Secondary | ICD-10-CM | POA: Diagnosis not present

## 2018-12-11 DIAGNOSIS — M1991 Primary osteoarthritis, unspecified site: Secondary | ICD-10-CM | POA: Diagnosis not present

## 2018-12-11 DIAGNOSIS — E1122 Type 2 diabetes mellitus with diabetic chronic kidney disease: Secondary | ICD-10-CM | POA: Diagnosis not present

## 2018-12-11 DIAGNOSIS — M6282 Rhabdomyolysis: Secondary | ICD-10-CM | POA: Diagnosis not present

## 2018-12-11 DIAGNOSIS — N400 Enlarged prostate without lower urinary tract symptoms: Secondary | ICD-10-CM | POA: Diagnosis not present

## 2018-12-13 ENCOUNTER — Ambulatory Visit (INDEPENDENT_AMBULATORY_CARE_PROVIDER_SITE_OTHER): Payer: Medicare HMO | Admitting: Urology

## 2018-12-13 DIAGNOSIS — R3915 Urgency of urination: Secondary | ICD-10-CM | POA: Diagnosis not present

## 2018-12-13 DIAGNOSIS — N401 Enlarged prostate with lower urinary tract symptoms: Secondary | ICD-10-CM

## 2018-12-14 DIAGNOSIS — M6282 Rhabdomyolysis: Secondary | ICD-10-CM | POA: Diagnosis not present

## 2018-12-14 DIAGNOSIS — E1122 Type 2 diabetes mellitus with diabetic chronic kidney disease: Secondary | ICD-10-CM | POA: Diagnosis not present

## 2018-12-14 DIAGNOSIS — M1991 Primary osteoarthritis, unspecified site: Secondary | ICD-10-CM | POA: Diagnosis not present

## 2018-12-14 DIAGNOSIS — G309 Alzheimer's disease, unspecified: Secondary | ICD-10-CM | POA: Diagnosis not present

## 2018-12-14 DIAGNOSIS — N400 Enlarged prostate without lower urinary tract symptoms: Secondary | ICD-10-CM | POA: Diagnosis not present

## 2018-12-14 DIAGNOSIS — I129 Hypertensive chronic kidney disease with stage 1 through stage 4 chronic kidney disease, or unspecified chronic kidney disease: Secondary | ICD-10-CM | POA: Diagnosis not present

## 2018-12-14 DIAGNOSIS — W19XXXD Unspecified fall, subsequent encounter: Secondary | ICD-10-CM | POA: Diagnosis not present

## 2018-12-14 DIAGNOSIS — N183 Chronic kidney disease, stage 3 (moderate): Secondary | ICD-10-CM | POA: Diagnosis not present

## 2018-12-14 DIAGNOSIS — F0280 Dementia in other diseases classified elsewhere without behavioral disturbance: Secondary | ICD-10-CM | POA: Diagnosis not present

## 2018-12-18 ENCOUNTER — Telehealth: Payer: Self-pay

## 2018-12-18 ENCOUNTER — Other Ambulatory Visit: Payer: Self-pay | Admitting: Family Medicine

## 2018-12-18 DIAGNOSIS — K219 Gastro-esophageal reflux disease without esophagitis: Secondary | ICD-10-CM

## 2018-12-18 MED ORDER — OMEPRAZOLE 40 MG PO CPDR: DELAYED_RELEASE_CAPSULE | ORAL | 1 refills | Status: DC

## 2018-12-18 NOTE — Telephone Encounter (Signed)
Spoke with patient's son Chase Caller he is aware.

## 2018-12-18 NOTE — Progress Notes (Signed)
Omeprazole

## 2018-12-18 NOTE — Telephone Encounter (Signed)
Patient's son called this morning and states that after Dad eats and sometimes during the night, Dad starts producing a lot of mucous and coughs until he chokes, he is also having to spit the mucous into a cup. No fever or cold symptoms, just the mucous, this has been going on for about a month. Any suggestions?

## 2018-12-18 NOTE — Telephone Encounter (Signed)
Sounds as though symptoms are uncontrolled reflux. I recommend no eating 3 hours before bedtime, stop caffeine, and am referring him to GI to further eval, this is entered. Short term I will increase the omeprazole to twice daily for 2 months on;ly and script is sent in

## 2018-12-19 DIAGNOSIS — E1122 Type 2 diabetes mellitus with diabetic chronic kidney disease: Secondary | ICD-10-CM | POA: Diagnosis not present

## 2018-12-19 DIAGNOSIS — G309 Alzheimer's disease, unspecified: Secondary | ICD-10-CM | POA: Diagnosis not present

## 2018-12-19 DIAGNOSIS — M6282 Rhabdomyolysis: Secondary | ICD-10-CM | POA: Diagnosis not present

## 2018-12-19 DIAGNOSIS — N183 Chronic kidney disease, stage 3 (moderate): Secondary | ICD-10-CM | POA: Diagnosis not present

## 2018-12-19 DIAGNOSIS — W19XXXD Unspecified fall, subsequent encounter: Secondary | ICD-10-CM | POA: Diagnosis not present

## 2018-12-19 DIAGNOSIS — I129 Hypertensive chronic kidney disease with stage 1 through stage 4 chronic kidney disease, or unspecified chronic kidney disease: Secondary | ICD-10-CM | POA: Diagnosis not present

## 2018-12-19 DIAGNOSIS — F0280 Dementia in other diseases classified elsewhere without behavioral disturbance: Secondary | ICD-10-CM | POA: Diagnosis not present

## 2018-12-19 DIAGNOSIS — M1991 Primary osteoarthritis, unspecified site: Secondary | ICD-10-CM | POA: Diagnosis not present

## 2018-12-19 DIAGNOSIS — N400 Enlarged prostate without lower urinary tract symptoms: Secondary | ICD-10-CM | POA: Diagnosis not present

## 2018-12-20 ENCOUNTER — Other Ambulatory Visit: Payer: Self-pay | Admitting: Family Medicine

## 2018-12-21 ENCOUNTER — Telehealth: Payer: Self-pay | Admitting: Family Medicine

## 2018-12-21 ENCOUNTER — Other Ambulatory Visit: Payer: Self-pay

## 2018-12-21 MED ORDER — OMEPRAZOLE 40 MG PO CPDR: DELAYED_RELEASE_CAPSULE | ORAL | 1 refills | Status: DC

## 2018-12-21 NOTE — Telephone Encounter (Signed)
Bruce is calling for Verbal Orders for the PT for PT evaluation. Please call and advise

## 2018-12-21 NOTE — Telephone Encounter (Signed)
Noted thanks I will sign

## 2018-12-21 NOTE — Telephone Encounter (Signed)
Left message giving verbal order for PT. Requested fax for verbal order so that it may be signed.

## 2018-12-25 DIAGNOSIS — M1991 Primary osteoarthritis, unspecified site: Secondary | ICD-10-CM | POA: Diagnosis not present

## 2018-12-25 DIAGNOSIS — W19XXXD Unspecified fall, subsequent encounter: Secondary | ICD-10-CM | POA: Diagnosis not present

## 2018-12-25 DIAGNOSIS — M6282 Rhabdomyolysis: Secondary | ICD-10-CM | POA: Diagnosis not present

## 2018-12-25 DIAGNOSIS — F0280 Dementia in other diseases classified elsewhere without behavioral disturbance: Secondary | ICD-10-CM | POA: Diagnosis not present

## 2018-12-25 DIAGNOSIS — I129 Hypertensive chronic kidney disease with stage 1 through stage 4 chronic kidney disease, or unspecified chronic kidney disease: Secondary | ICD-10-CM | POA: Diagnosis not present

## 2018-12-25 DIAGNOSIS — N183 Chronic kidney disease, stage 3 (moderate): Secondary | ICD-10-CM | POA: Diagnosis not present

## 2018-12-25 DIAGNOSIS — G309 Alzheimer's disease, unspecified: Secondary | ICD-10-CM | POA: Diagnosis not present

## 2018-12-25 DIAGNOSIS — N400 Enlarged prostate without lower urinary tract symptoms: Secondary | ICD-10-CM | POA: Diagnosis not present

## 2018-12-25 DIAGNOSIS — E1122 Type 2 diabetes mellitus with diabetic chronic kidney disease: Secondary | ICD-10-CM | POA: Diagnosis not present

## 2018-12-26 DIAGNOSIS — M6282 Rhabdomyolysis: Secondary | ICD-10-CM | POA: Diagnosis not present

## 2018-12-26 DIAGNOSIS — W19XXXD Unspecified fall, subsequent encounter: Secondary | ICD-10-CM | POA: Diagnosis not present

## 2018-12-26 DIAGNOSIS — M1991 Primary osteoarthritis, unspecified site: Secondary | ICD-10-CM | POA: Diagnosis not present

## 2018-12-26 DIAGNOSIS — N400 Enlarged prostate without lower urinary tract symptoms: Secondary | ICD-10-CM | POA: Diagnosis not present

## 2018-12-26 DIAGNOSIS — N183 Chronic kidney disease, stage 3 (moderate): Secondary | ICD-10-CM | POA: Diagnosis not present

## 2018-12-26 DIAGNOSIS — F0280 Dementia in other diseases classified elsewhere without behavioral disturbance: Secondary | ICD-10-CM | POA: Diagnosis not present

## 2018-12-26 DIAGNOSIS — E1122 Type 2 diabetes mellitus with diabetic chronic kidney disease: Secondary | ICD-10-CM | POA: Diagnosis not present

## 2018-12-26 DIAGNOSIS — I129 Hypertensive chronic kidney disease with stage 1 through stage 4 chronic kidney disease, or unspecified chronic kidney disease: Secondary | ICD-10-CM | POA: Diagnosis not present

## 2018-12-26 DIAGNOSIS — G309 Alzheimer's disease, unspecified: Secondary | ICD-10-CM | POA: Diagnosis not present

## 2018-12-27 ENCOUNTER — Other Ambulatory Visit: Payer: Self-pay | Admitting: Family Medicine

## 2018-12-27 DIAGNOSIS — I129 Hypertensive chronic kidney disease with stage 1 through stage 4 chronic kidney disease, or unspecified chronic kidney disease: Secondary | ICD-10-CM | POA: Diagnosis not present

## 2018-12-27 DIAGNOSIS — M1991 Primary osteoarthritis, unspecified site: Secondary | ICD-10-CM | POA: Diagnosis not present

## 2018-12-27 DIAGNOSIS — G309 Alzheimer's disease, unspecified: Secondary | ICD-10-CM | POA: Diagnosis not present

## 2018-12-27 DIAGNOSIS — F0280 Dementia in other diseases classified elsewhere without behavioral disturbance: Secondary | ICD-10-CM | POA: Diagnosis not present

## 2018-12-27 DIAGNOSIS — N183 Chronic kidney disease, stage 3 (moderate): Secondary | ICD-10-CM | POA: Diagnosis not present

## 2018-12-27 DIAGNOSIS — M6282 Rhabdomyolysis: Secondary | ICD-10-CM | POA: Diagnosis not present

## 2018-12-27 DIAGNOSIS — N400 Enlarged prostate without lower urinary tract symptoms: Secondary | ICD-10-CM | POA: Diagnosis not present

## 2018-12-27 DIAGNOSIS — E1122 Type 2 diabetes mellitus with diabetic chronic kidney disease: Secondary | ICD-10-CM | POA: Diagnosis not present

## 2018-12-27 DIAGNOSIS — W19XXXD Unspecified fall, subsequent encounter: Secondary | ICD-10-CM | POA: Diagnosis not present

## 2018-12-27 NOTE — Telephone Encounter (Signed)
Do you want to refill this ? 

## 2018-12-28 ENCOUNTER — Other Ambulatory Visit: Payer: Self-pay | Admitting: Family Medicine

## 2018-12-28 MED ORDER — TRAMADOL HCL 50 MG PO TABS: ORAL_TABLET | ORAL | 5 refills | Status: DC

## 2018-12-29 DIAGNOSIS — W19XXXD Unspecified fall, subsequent encounter: Secondary | ICD-10-CM | POA: Diagnosis not present

## 2018-12-29 DIAGNOSIS — I129 Hypertensive chronic kidney disease with stage 1 through stage 4 chronic kidney disease, or unspecified chronic kidney disease: Secondary | ICD-10-CM | POA: Diagnosis not present

## 2018-12-29 DIAGNOSIS — F0280 Dementia in other diseases classified elsewhere without behavioral disturbance: Secondary | ICD-10-CM | POA: Diagnosis not present

## 2018-12-29 DIAGNOSIS — N183 Chronic kidney disease, stage 3 (moderate): Secondary | ICD-10-CM | POA: Diagnosis not present

## 2018-12-29 DIAGNOSIS — M1991 Primary osteoarthritis, unspecified site: Secondary | ICD-10-CM | POA: Diagnosis not present

## 2018-12-29 DIAGNOSIS — N400 Enlarged prostate without lower urinary tract symptoms: Secondary | ICD-10-CM | POA: Diagnosis not present

## 2018-12-29 DIAGNOSIS — M6282 Rhabdomyolysis: Secondary | ICD-10-CM | POA: Diagnosis not present

## 2018-12-29 DIAGNOSIS — G309 Alzheimer's disease, unspecified: Secondary | ICD-10-CM | POA: Diagnosis not present

## 2018-12-29 DIAGNOSIS — E1122 Type 2 diabetes mellitus with diabetic chronic kidney disease: Secondary | ICD-10-CM | POA: Diagnosis not present

## 2019-01-02 DIAGNOSIS — R0902 Hypoxemia: Secondary | ICD-10-CM | POA: Diagnosis not present

## 2019-01-02 DIAGNOSIS — E1129 Type 2 diabetes mellitus with other diabetic kidney complication: Secondary | ICD-10-CM | POA: Diagnosis not present

## 2019-01-02 DIAGNOSIS — F329 Major depressive disorder, single episode, unspecified: Secondary | ICD-10-CM | POA: Diagnosis not present

## 2019-01-02 DIAGNOSIS — G308 Other Alzheimer's disease: Secondary | ICD-10-CM | POA: Diagnosis not present

## 2019-01-03 DIAGNOSIS — W19XXXD Unspecified fall, subsequent encounter: Secondary | ICD-10-CM | POA: Diagnosis not present

## 2019-01-03 DIAGNOSIS — M1991 Primary osteoarthritis, unspecified site: Secondary | ICD-10-CM | POA: Diagnosis not present

## 2019-01-03 DIAGNOSIS — E1122 Type 2 diabetes mellitus with diabetic chronic kidney disease: Secondary | ICD-10-CM | POA: Diagnosis not present

## 2019-01-03 DIAGNOSIS — F0280 Dementia in other diseases classified elsewhere without behavioral disturbance: Secondary | ICD-10-CM | POA: Diagnosis not present

## 2019-01-03 DIAGNOSIS — N183 Chronic kidney disease, stage 3 (moderate): Secondary | ICD-10-CM | POA: Diagnosis not present

## 2019-01-03 DIAGNOSIS — M6282 Rhabdomyolysis: Secondary | ICD-10-CM | POA: Diagnosis not present

## 2019-01-03 DIAGNOSIS — I129 Hypertensive chronic kidney disease with stage 1 through stage 4 chronic kidney disease, or unspecified chronic kidney disease: Secondary | ICD-10-CM | POA: Diagnosis not present

## 2019-01-03 DIAGNOSIS — N400 Enlarged prostate without lower urinary tract symptoms: Secondary | ICD-10-CM | POA: Diagnosis not present

## 2019-01-03 DIAGNOSIS — G309 Alzheimer's disease, unspecified: Secondary | ICD-10-CM | POA: Diagnosis not present

## 2019-01-05 DIAGNOSIS — N183 Chronic kidney disease, stage 3 (moderate): Secondary | ICD-10-CM | POA: Diagnosis not present

## 2019-01-05 DIAGNOSIS — I129 Hypertensive chronic kidney disease with stage 1 through stage 4 chronic kidney disease, or unspecified chronic kidney disease: Secondary | ICD-10-CM | POA: Diagnosis not present

## 2019-01-05 DIAGNOSIS — E1122 Type 2 diabetes mellitus with diabetic chronic kidney disease: Secondary | ICD-10-CM | POA: Diagnosis not present

## 2019-01-05 DIAGNOSIS — N400 Enlarged prostate without lower urinary tract symptoms: Secondary | ICD-10-CM | POA: Diagnosis not present

## 2019-01-05 DIAGNOSIS — M6282 Rhabdomyolysis: Secondary | ICD-10-CM | POA: Diagnosis not present

## 2019-01-05 DIAGNOSIS — G309 Alzheimer's disease, unspecified: Secondary | ICD-10-CM | POA: Diagnosis not present

## 2019-01-05 DIAGNOSIS — M1991 Primary osteoarthritis, unspecified site: Secondary | ICD-10-CM | POA: Diagnosis not present

## 2019-01-05 DIAGNOSIS — W19XXXD Unspecified fall, subsequent encounter: Secondary | ICD-10-CM | POA: Diagnosis not present

## 2019-01-05 DIAGNOSIS — F0280 Dementia in other diseases classified elsewhere without behavioral disturbance: Secondary | ICD-10-CM | POA: Diagnosis not present

## 2019-01-09 ENCOUNTER — Ambulatory Visit (INDEPENDENT_AMBULATORY_CARE_PROVIDER_SITE_OTHER): Payer: Self-pay | Admitting: Internal Medicine

## 2019-01-09 DIAGNOSIS — E1122 Type 2 diabetes mellitus with diabetic chronic kidney disease: Secondary | ICD-10-CM | POA: Diagnosis not present

## 2019-01-09 DIAGNOSIS — I129 Hypertensive chronic kidney disease with stage 1 through stage 4 chronic kidney disease, or unspecified chronic kidney disease: Secondary | ICD-10-CM | POA: Diagnosis not present

## 2019-01-09 DIAGNOSIS — N400 Enlarged prostate without lower urinary tract symptoms: Secondary | ICD-10-CM | POA: Diagnosis not present

## 2019-01-09 DIAGNOSIS — G309 Alzheimer's disease, unspecified: Secondary | ICD-10-CM | POA: Diagnosis not present

## 2019-01-09 DIAGNOSIS — M1991 Primary osteoarthritis, unspecified site: Secondary | ICD-10-CM | POA: Diagnosis not present

## 2019-01-09 DIAGNOSIS — W19XXXD Unspecified fall, subsequent encounter: Secondary | ICD-10-CM | POA: Diagnosis not present

## 2019-01-09 DIAGNOSIS — M6282 Rhabdomyolysis: Secondary | ICD-10-CM | POA: Diagnosis not present

## 2019-01-09 DIAGNOSIS — F0280 Dementia in other diseases classified elsewhere without behavioral disturbance: Secondary | ICD-10-CM | POA: Diagnosis not present

## 2019-01-09 DIAGNOSIS — N183 Chronic kidney disease, stage 3 (moderate): Secondary | ICD-10-CM | POA: Diagnosis not present

## 2019-01-11 ENCOUNTER — Telehealth: Payer: Self-pay | Admitting: Family Medicine

## 2019-01-11 ENCOUNTER — Ambulatory Visit (INDEPENDENT_AMBULATORY_CARE_PROVIDER_SITE_OTHER): Payer: Self-pay | Admitting: Internal Medicine

## 2019-01-11 DIAGNOSIS — I129 Hypertensive chronic kidney disease with stage 1 through stage 4 chronic kidney disease, or unspecified chronic kidney disease: Secondary | ICD-10-CM | POA: Diagnosis not present

## 2019-01-11 DIAGNOSIS — E1122 Type 2 diabetes mellitus with diabetic chronic kidney disease: Secondary | ICD-10-CM | POA: Diagnosis not present

## 2019-01-11 DIAGNOSIS — W19XXXD Unspecified fall, subsequent encounter: Secondary | ICD-10-CM | POA: Diagnosis not present

## 2019-01-11 DIAGNOSIS — M6282 Rhabdomyolysis: Secondary | ICD-10-CM | POA: Diagnosis not present

## 2019-01-11 DIAGNOSIS — M1991 Primary osteoarthritis, unspecified site: Secondary | ICD-10-CM | POA: Diagnosis not present

## 2019-01-11 DIAGNOSIS — N400 Enlarged prostate without lower urinary tract symptoms: Secondary | ICD-10-CM | POA: Diagnosis not present

## 2019-01-11 DIAGNOSIS — G309 Alzheimer's disease, unspecified: Secondary | ICD-10-CM | POA: Diagnosis not present

## 2019-01-11 DIAGNOSIS — N183 Chronic kidney disease, stage 3 (moderate): Secondary | ICD-10-CM | POA: Diagnosis not present

## 2019-01-11 DIAGNOSIS — F0280 Dementia in other diseases classified elsewhere without behavioral disturbance: Secondary | ICD-10-CM | POA: Diagnosis not present

## 2019-01-11 NOTE — Telephone Encounter (Signed)
I reccommended elevating legs and also there are no fluid pills currently taking, see Pulmonary next week

## 2019-01-11 NOTE — Telephone Encounter (Signed)
Called Nurse back, she states that at her visit today, she felt like Samuel Ryan had some extra edema in his feet, and breath sounds on the right side sounded wheezy. She felt like maybe he was experiencing some fluid build up. He has a pulmonologist appointment on Monday. Do you feel that he should take an extra fluid pill today? Please advise

## 2019-01-11 NOTE — Telephone Encounter (Signed)
Couldn't understand the message completely, However I did get that she is asking for the pt to take an extra fluid pill for today

## 2019-01-12 DIAGNOSIS — H2513 Age-related nuclear cataract, bilateral: Secondary | ICD-10-CM | POA: Diagnosis not present

## 2019-01-12 DIAGNOSIS — H40033 Anatomical narrow angle, bilateral: Secondary | ICD-10-CM | POA: Diagnosis not present

## 2019-01-12 DIAGNOSIS — Z0100 Encounter for examination of eyes and vision without abnormal findings: Secondary | ICD-10-CM | POA: Diagnosis not present

## 2019-01-12 NOTE — Telephone Encounter (Signed)
Spoke with son. Swelling in patient's legs have gone down. Patient wasn't keeping legs elevated like he normally does and he thinks this is what caused the swelling.

## 2019-01-15 ENCOUNTER — Ambulatory Visit (INDEPENDENT_AMBULATORY_CARE_PROVIDER_SITE_OTHER): Payer: Medicare HMO | Admitting: Internal Medicine

## 2019-01-15 ENCOUNTER — Encounter (INDEPENDENT_AMBULATORY_CARE_PROVIDER_SITE_OTHER): Payer: Self-pay | Admitting: Internal Medicine

## 2019-01-15 VITALS — BP 101/67 | HR 73 | Temp 97.5°F | Ht 71.0 in | Wt 256.3 lb

## 2019-01-15 DIAGNOSIS — K219 Gastro-esophageal reflux disease without esophagitis: Secondary | ICD-10-CM | POA: Diagnosis not present

## 2019-01-15 DIAGNOSIS — R1319 Other dysphagia: Secondary | ICD-10-CM

## 2019-01-15 DIAGNOSIS — R1310 Dysphagia, unspecified: Secondary | ICD-10-CM | POA: Diagnosis not present

## 2019-01-15 NOTE — Patient Instructions (Signed)
DG esophgram

## 2019-01-15 NOTE — Progress Notes (Signed)
Subjective:    Patient ID: Samuel Ryan, male    DOB: 11-06-1931, 85 y.o.   MRN: [id number]  HPI Referred by Dr. Moshe Cipro for GERD. Taking Omeprazole 31m BID.  Son states he has been having some dysphagia. Son states his father had a lot of mucous which is better now. Patient states his bottom teeth are loose.Son states his father is choking on foods.He is on a regular diet. Appetite is good. No  Weight loss. BMs are normal.Occasionally has constipation.  Last colonoscopy was in 2013 which was normal.  Walks with a walker. In a w/ch today.   Hx of CKD, Dementia, Diabetes. SVTGERD  Review of Systems Past Medical History:  Diagnosis Date  . Acute cholecystitis 11/2012   Drained percutaneously  . Anxiety   . BPH (benign prostatic hypertrophy)   . Deep venous thrombosis (HCC)    Left leg, diagnosed 7/12  . Dementia ([facility]    Needs supervision for safety per son - POA  . Diverticulitis   . Essential hypertension, benign   . GERD (gastroesophageal reflux disease)   . History of pneumonia    Prior to 2012  . Incontinence of urine   . Mixed hyperlipidemia   . Osteoarthritis   . Pulmonary embolism ([facility]    Diagnosed 7/12  . Type 2 diabetes mellitus ([facility]     Past Surgical History:  Procedure Laterality Date  . CARPAL TUNNEL RELEASE    . COLONOSCOPY  08/31/2012   Procedure: COLONOSCOPY;  Surgeon: NRogene Houston MD;  Location: [facility];  Service: Endoscopy;  Laterality: N/A;  830  . CYSTOSCOPY WITH INJECTION N/A 05/15/2018   Procedure: CYSTOSCOPY WITH BOTOX INJECTION;  Surgeon: MCleon Gustin MD;  Location: [facility] ORS;  Service: Urology;  Laterality: N/A;  . CYSTOSCOPY WITH INJECTION N/A 11/10/2018   Procedure: CYSTOSCOPY WITH BOTOX  INJECTION;  Surgeon: MCleon Gustin MD;  Location: [facility] OR[facility];  Service: Urology;  Laterality: N/A;  30 MINS  . CYSTOSCOPY WITH INSERTION OF UROLIFT N/A 09/02/2016   Procedure: CYSTOSCOPY WITH INSERTION OF UROLIFT;  Surgeon: PCleon Gustin  MD;  [facility]  Service: Urology;  Laterality: N/A;  . EYE SURGERY Bilateral    bilateral cataract  . Right total knee replacement  04/23/2011  . SPINE SURGERY  prior to 2012   Neck fusion  . UMBILICAL HERNIA REPAIR     umbilical    Allergies  Allergen Reactions  . Zosyn [Piperacillin Sod-Tazobactam So] Anaphylaxis, Swelling and Other (See Comments)    Facial swelling Has patient had a PCN reaction causing immediate rash, facial/tongue/throat swelling, SOB or lightheadedness with hypotension: Yes Has patient had a PCN reaction causing severe rash involving mucus membranes or skin necrosis: No Has patient had a PCN reaction that required hospitalization: No Has patient had a PCN reaction occurring within the last 10 years: Yes If all of the above answers are "NO", then may proceed with Cephalosporin use.   . Ace Inhibitors Cough  . Vancomycin Swelling    Facial swelling    Current Outpatient Medications on File Prior to Visit  Medication Sig Dispense Refill  . acetaminophen (TYLENOL) 500 MG tablet Take 1,000 mg by mouth every 6 (six) hours as needed for mild pain, moderate pain or headache.     .Marland Kitchenatenolol (TENORMIN) 50 MG tablet Take 1 tablet (50 mg total) by mouth daily. 90 tablet 1  . clobetasol cream (TEMOVATE) 04.31% APPLY 1 APPLICATION TOPICALLY  2 (TWO) TIMES DAILY. (Patient taking differently: Apply 1 application topically 2 (two) times a week. ) 30 g 0  . donepezil (ARICEPT) 10 MG tablet TAKE 1 TABLET AT BEDTIME 90 tablet 1  . Ferrous Sulfate (IRON) 325 (65 Fe) MG TABS Take 325 mg by mouth daily.     [name] glipiZIDE (GLUCOTROL XL) 10 MG 24 hr tablet Take 1 tablet (10 mg total) by mouth daily with breakfast. 90 tablet 3  . Insulin Glargine (LANTUS SOLOSTAR) 100 UNIT/ML Solostar Pen Inject 10 Units into the skin every morning. HOLD for blood sugar levels below 100 15 mL 11  . loratadine (CLARITIN) 10 MG tablet Take 10 mg by mouth every morning.     .  lovastatin (MEVACOR) 40 MG tablet TAKE 1 TABLET AT BEDTIME 90 tablet 0  . omeprazole (PRILOSEC) 40 MG capsule Take one capsule two times daily 60 capsule 1  . sertraline (ZOLOFT) 50 MG tablet TAKE 1 TABLET EVERY DAY (Patient taking differently: Take 50 mg by mouth daily. ) 90 tablet 1  . tamsulosin (FLOMAX) 0.4 MG CAPS capsule TAKE 1 CAPSULE EVERY DAY (Patient taking differently: Take 0.4 mg by mouth daily. ) 90 capsule 0  . traMADol (ULTRAM) 50 MG tablet Take 1 tablet (50 mg total) by mouth every 12 (twelve) hours as needed for moderate pain. 30 tablet 0  . Trospium Chloride 60 MG CP24 Take 60 mg by mouth daily.     . Vitamin D, Ergocalciferol, (DRISDOL) 50000 units CAPS capsule Take 1 capsule (50,000 Units total) by mouth every 7 (seven) days. 4 capsule 3  . Blood Glucose Monitoring Suppl (TRUE METRIX METER) w/Device KIT With supplies, alcohol swabs as covered by insurance 1 kit 0  . glucose blood test strip Once daily testing 50 each 0  . Insulin Pen Needle (PEN NEEDLES) 31G X 5 MM MISC 1 each by Does not apply route as directed. Use to inject lantus daily dx E11.65 100 each 5  . Insulin Pen Needle 29G X 5MM MISC 1 each by Does not apply route as directed. 100 each 2   Current Facility-Administered Medications on File Prior to Visit  Medication Dose Route Frequency Provider Last Rate Last Dose  . botulinum toxin Type A (BOTOX) injection 100 Units  100 Units Intramuscular Once McKenzie, [name], MD            Objective:   Physical Exam Blood pressure 101/67, pulse 73, temperature (!) 97.5 F (36.4 C), height _0  (1.803 m), weight 256 lb 4.8 oz (116.3 kg). Alert and oriented. Skin warm and dry. Oral mucosa is moist.   . Sclera anicteric, conjunctivae is pink. Thyroid not enlarged. No cervical lymphadenopathy. Lungs clear. Heart regular rate and rhythm.  Abdomen is soft. Bowel sounds are positive. No hepatomegaly. No abdominal masses felt. No tenderness.  2+ edema to lower extremities.          Assessment & Plan:  GERD. Better since starting the Omeprazole. (Patient denies GERD). Will get an esophagram. Further recommendations to follow.

## 2019-01-16 DIAGNOSIS — M6282 Rhabdomyolysis: Secondary | ICD-10-CM | POA: Diagnosis not present

## 2019-01-16 DIAGNOSIS — F0280 Dementia in other diseases classified elsewhere without behavioral disturbance: Secondary | ICD-10-CM | POA: Diagnosis not present

## 2019-01-16 DIAGNOSIS — G309 Alzheimer's disease, unspecified: Secondary | ICD-10-CM | POA: Diagnosis not present

## 2019-01-16 DIAGNOSIS — E1122 Type 2 diabetes mellitus with diabetic chronic kidney disease: Secondary | ICD-10-CM | POA: Diagnosis not present

## 2019-01-16 DIAGNOSIS — N400 Enlarged prostate without lower urinary tract symptoms: Secondary | ICD-10-CM | POA: Diagnosis not present

## 2019-01-16 DIAGNOSIS — M1991 Primary osteoarthritis, unspecified site: Secondary | ICD-10-CM | POA: Diagnosis not present

## 2019-01-16 DIAGNOSIS — W19XXXD Unspecified fall, subsequent encounter: Secondary | ICD-10-CM | POA: Diagnosis not present

## 2019-01-16 DIAGNOSIS — N183 Chronic kidney disease, stage 3 (moderate): Secondary | ICD-10-CM | POA: Diagnosis not present

## 2019-01-16 DIAGNOSIS — I129 Hypertensive chronic kidney disease with stage 1 through stage 4 chronic kidney disease, or unspecified chronic kidney disease: Secondary | ICD-10-CM | POA: Diagnosis not present

## 2019-01-17 ENCOUNTER — Telehealth: Payer: Self-pay | Admitting: Cardiology

## 2019-01-17 NOTE — Telephone Encounter (Signed)
Physical Therapy called stating that Mr. Ager seemed to be having slight shortness of breath on visit of 01-16-2019. Son called stating that he thinks heart medication could be making patient sleepy.. please call [phone].Darryl Lent Son .

## 2019-01-17 NOTE — Telephone Encounter (Signed)
Pt son says physical therapy came yesterday and say baseline HR was usually around 58-65 - has now increased to 80s-90s with light exercise - after resting HR dropped to 48 and took around 3 mins to reach 60 BPM again - pt c/o some SOB but this is not new for him and denied any other symptoms O2 was 93% - PT wanted to make Dr Domenic Polite aware - son says physical therapist was going to contact us but we have not received a call - will forward to provider FYI

## 2019-01-18 ENCOUNTER — Ambulatory Visit (HOSPITAL_COMMUNITY)
Admission: RE | Admit: 2019-01-18 | Discharge: 2019-01-18 | Disposition: A | Discharge status: Home / Self Care | Payer: Medicare HMO | Source: Ambulatory Visit | Attending: Internal Medicine | Admitting: Internal Medicine

## 2019-01-18 DIAGNOSIS — R1310 Dysphagia, unspecified: Secondary | ICD-10-CM | POA: Insufficient documentation

## 2019-01-18 DIAGNOSIS — M6282 Rhabdomyolysis: Secondary | ICD-10-CM | POA: Diagnosis not present

## 2019-01-18 DIAGNOSIS — E1122 Type 2 diabetes mellitus with diabetic chronic kidney disease: Secondary | ICD-10-CM | POA: Diagnosis not present

## 2019-01-18 DIAGNOSIS — N400 Enlarged prostate without lower urinary tract symptoms: Secondary | ICD-10-CM | POA: Diagnosis not present

## 2019-01-18 DIAGNOSIS — R1319 Other dysphagia: Secondary | ICD-10-CM

## 2019-01-18 DIAGNOSIS — F0280 Dementia in other diseases classified elsewhere without behavioral disturbance: Secondary | ICD-10-CM | POA: Diagnosis not present

## 2019-01-18 DIAGNOSIS — N183 Chronic kidney disease, stage 3 (moderate): Secondary | ICD-10-CM | POA: Diagnosis not present

## 2019-01-18 DIAGNOSIS — G309 Alzheimer's disease, unspecified: Secondary | ICD-10-CM | POA: Diagnosis not present

## 2019-01-18 DIAGNOSIS — I129 Hypertensive chronic kidney disease with stage 1 through stage 4 chronic kidney disease, or unspecified chronic kidney disease: Secondary | ICD-10-CM | POA: Diagnosis not present

## 2019-01-18 DIAGNOSIS — W19XXXD Unspecified fall, subsequent encounter: Secondary | ICD-10-CM | POA: Diagnosis not present

## 2019-01-18 DIAGNOSIS — M1991 Primary osteoarthritis, unspecified site: Secondary | ICD-10-CM | POA: Diagnosis not present

## 2019-01-18 NOTE — Telephone Encounter (Signed)
I need a better sense of what his resting heart rate has been doing.  If the concern is that he is bradycardic and this is limiting him in terms of shortness of breath, we could certainly cut back on his atenolol.  Of note, he is also on Aricept for dementia, and this can also cause bradycardia.

## 2019-01-18 NOTE — Telephone Encounter (Signed)
Son will have physical therapist contact us for additional info

## 2019-01-22 ENCOUNTER — Other Ambulatory Visit: Payer: Self-pay | Admitting: Family Medicine

## 2019-01-23 DIAGNOSIS — G309 Alzheimer's disease, unspecified: Secondary | ICD-10-CM | POA: Diagnosis not present

## 2019-01-23 DIAGNOSIS — W19XXXD Unspecified fall, subsequent encounter: Secondary | ICD-10-CM | POA: Diagnosis not present

## 2019-01-23 DIAGNOSIS — N183 Chronic kidney disease, stage 3 (moderate): Secondary | ICD-10-CM | POA: Diagnosis not present

## 2019-01-23 DIAGNOSIS — F0280 Dementia in other diseases classified elsewhere without behavioral disturbance: Secondary | ICD-10-CM | POA: Diagnosis not present

## 2019-01-23 DIAGNOSIS — N400 Enlarged prostate without lower urinary tract symptoms: Secondary | ICD-10-CM | POA: Diagnosis not present

## 2019-01-23 DIAGNOSIS — M1991 Primary osteoarthritis, unspecified site: Secondary | ICD-10-CM | POA: Diagnosis not present

## 2019-01-23 DIAGNOSIS — E1122 Type 2 diabetes mellitus with diabetic chronic kidney disease: Secondary | ICD-10-CM | POA: Diagnosis not present

## 2019-01-23 DIAGNOSIS — M6282 Rhabdomyolysis: Secondary | ICD-10-CM | POA: Diagnosis not present

## 2019-01-23 DIAGNOSIS — I129 Hypertensive chronic kidney disease with stage 1 through stage 4 chronic kidney disease, or unspecified chronic kidney disease: Secondary | ICD-10-CM | POA: Diagnosis not present

## 2019-01-25 DIAGNOSIS — N400 Enlarged prostate without lower urinary tract symptoms: Secondary | ICD-10-CM | POA: Diagnosis not present

## 2019-01-25 DIAGNOSIS — I129 Hypertensive chronic kidney disease with stage 1 through stage 4 chronic kidney disease, or unspecified chronic kidney disease: Secondary | ICD-10-CM | POA: Diagnosis not present

## 2019-01-25 DIAGNOSIS — M6282 Rhabdomyolysis: Secondary | ICD-10-CM | POA: Diagnosis not present

## 2019-01-25 DIAGNOSIS — N183 Chronic kidney disease, stage 3 (moderate): Secondary | ICD-10-CM | POA: Diagnosis not present

## 2019-01-25 DIAGNOSIS — E1122 Type 2 diabetes mellitus with diabetic chronic kidney disease: Secondary | ICD-10-CM | POA: Diagnosis not present

## 2019-01-25 DIAGNOSIS — G309 Alzheimer's disease, unspecified: Secondary | ICD-10-CM | POA: Diagnosis not present

## 2019-01-25 DIAGNOSIS — F0280 Dementia in other diseases classified elsewhere without behavioral disturbance: Secondary | ICD-10-CM | POA: Diagnosis not present

## 2019-01-25 DIAGNOSIS — M1991 Primary osteoarthritis, unspecified site: Secondary | ICD-10-CM | POA: Diagnosis not present

## 2019-01-25 DIAGNOSIS — W19XXXD Unspecified fall, subsequent encounter: Secondary | ICD-10-CM | POA: Diagnosis not present

## 2019-01-29 DIAGNOSIS — E1122 Type 2 diabetes mellitus with diabetic chronic kidney disease: Secondary | ICD-10-CM | POA: Diagnosis not present

## 2019-01-29 DIAGNOSIS — N183 Chronic kidney disease, stage 3 (moderate): Secondary | ICD-10-CM | POA: Diagnosis not present

## 2019-01-29 DIAGNOSIS — M6282 Rhabdomyolysis: Secondary | ICD-10-CM | POA: Diagnosis not present

## 2019-01-29 DIAGNOSIS — G309 Alzheimer's disease, unspecified: Secondary | ICD-10-CM | POA: Diagnosis not present

## 2019-01-29 DIAGNOSIS — W19XXXD Unspecified fall, subsequent encounter: Secondary | ICD-10-CM | POA: Diagnosis not present

## 2019-01-29 DIAGNOSIS — F0280 Dementia in other diseases classified elsewhere without behavioral disturbance: Secondary | ICD-10-CM | POA: Diagnosis not present

## 2019-01-29 DIAGNOSIS — M1991 Primary osteoarthritis, unspecified site: Secondary | ICD-10-CM | POA: Diagnosis not present

## 2019-01-29 DIAGNOSIS — N400 Enlarged prostate without lower urinary tract symptoms: Secondary | ICD-10-CM | POA: Diagnosis not present

## 2019-01-29 DIAGNOSIS — I129 Hypertensive chronic kidney disease with stage 1 through stage 4 chronic kidney disease, or unspecified chronic kidney disease: Secondary | ICD-10-CM | POA: Diagnosis not present

## 2019-01-31 DIAGNOSIS — N183 Chronic kidney disease, stage 3 (moderate): Secondary | ICD-10-CM

## 2019-01-31 DIAGNOSIS — F329 Major depressive disorder, single episode, unspecified: Secondary | ICD-10-CM | POA: Diagnosis not present

## 2019-01-31 DIAGNOSIS — M1991 Primary osteoarthritis, unspecified site: Secondary | ICD-10-CM

## 2019-01-31 DIAGNOSIS — E1122 Type 2 diabetes mellitus with diabetic chronic kidney disease: Secondary | ICD-10-CM | POA: Diagnosis not present

## 2019-01-31 DIAGNOSIS — R0902 Hypoxemia: Secondary | ICD-10-CM | POA: Diagnosis not present

## 2019-01-31 DIAGNOSIS — I129 Hypertensive chronic kidney disease with stage 1 through stage 4 chronic kidney disease, or unspecified chronic kidney disease: Secondary | ICD-10-CM

## 2019-01-31 DIAGNOSIS — F0280 Dementia in other diseases classified elsewhere without behavioral disturbance: Secondary | ICD-10-CM | POA: Diagnosis not present

## 2019-01-31 DIAGNOSIS — G309 Alzheimer's disease, unspecified: Secondary | ICD-10-CM | POA: Diagnosis not present

## 2019-01-31 DIAGNOSIS — N400 Enlarged prostate without lower urinary tract symptoms: Secondary | ICD-10-CM

## 2019-01-31 DIAGNOSIS — G308 Other Alzheimer's disease: Secondary | ICD-10-CM | POA: Diagnosis not present

## 2019-01-31 DIAGNOSIS — M6282 Rhabdomyolysis: Secondary | ICD-10-CM | POA: Diagnosis not present

## 2019-01-31 DIAGNOSIS — W19XXXD Unspecified fall, subsequent encounter: Secondary | ICD-10-CM | POA: Diagnosis not present

## 2019-01-31 DIAGNOSIS — Z9181 History of falling: Secondary | ICD-10-CM

## 2019-01-31 DIAGNOSIS — E1129 Type 2 diabetes mellitus with other diabetic kidney complication: Secondary | ICD-10-CM | POA: Diagnosis not present

## 2019-02-03 ENCOUNTER — Other Ambulatory Visit: Payer: Self-pay | Admitting: Family Medicine

## 2019-02-08 ENCOUNTER — Other Ambulatory Visit: Payer: Self-pay | Admitting: Family Medicine

## 2019-02-20 ENCOUNTER — Telehealth: Payer: Self-pay | Admitting: Cardiology

## 2019-02-20 NOTE — Telephone Encounter (Signed)
Patient's son called stating that his father is sleeping a lot. He is wanting to know if the medication atenolol (TENORMIN) 50 MG tablet could be making him sleep.  Please call Werner Lean (son) [phone])

## 2019-02-20 NOTE — Telephone Encounter (Signed)
Go ahead and cut atenolol back from 50 mg daily to 25 mg daily.

## 2019-02-20 NOTE — Telephone Encounter (Signed)
Pt son says for the last 3 weeks pt has been weak and sleeping much more than normal - denies any other symptoms - wanted to know if he should cut back on the atenolol to see if this helps (last phone note mentions this but we never got a call from home health or contact info) son says BP today was 120/70 did not get HR reading and doesn't know what HR has been running

## 2019-02-20 NOTE — Telephone Encounter (Signed)
Pt son voiced understanding - updated medication list  

## 2019-02-23 ENCOUNTER — Other Ambulatory Visit: Payer: Self-pay | Admitting: Family Medicine

## 2019-03-03 DIAGNOSIS — F329 Major depressive disorder, single episode, unspecified: Secondary | ICD-10-CM | POA: Diagnosis not present

## 2019-03-03 DIAGNOSIS — G308 Other Alzheimer's disease: Secondary | ICD-10-CM | POA: Diagnosis not present

## 2019-03-03 DIAGNOSIS — R0902 Hypoxemia: Secondary | ICD-10-CM | POA: Diagnosis not present

## 2019-03-03 DIAGNOSIS — E1129 Type 2 diabetes mellitus with other diabetic kidney complication: Secondary | ICD-10-CM | POA: Diagnosis not present

## 2019-03-07 ENCOUNTER — Ambulatory Visit: Payer: Self-pay | Admitting: Family Medicine

## 2019-03-08 ENCOUNTER — Other Ambulatory Visit: Payer: Self-pay

## 2019-03-08 ENCOUNTER — Encounter: Payer: Self-pay | Admitting: Family Medicine

## 2019-03-08 ENCOUNTER — Ambulatory Visit (INDEPENDENT_AMBULATORY_CARE_PROVIDER_SITE_OTHER): Payer: Medicare HMO | Admitting: Family Medicine

## 2019-03-08 DIAGNOSIS — G308 Other Alzheimer's disease: Secondary | ICD-10-CM

## 2019-03-08 DIAGNOSIS — IMO0002 Reserved for concepts with insufficient information to code with codable children: Secondary | ICD-10-CM

## 2019-03-08 DIAGNOSIS — F0280 Dementia in other diseases classified elsewhere without behavioral disturbance: Secondary | ICD-10-CM

## 2019-03-08 DIAGNOSIS — E1165 Type 2 diabetes mellitus with hyperglycemia: Secondary | ICD-10-CM

## 2019-03-08 DIAGNOSIS — E1129 Type 2 diabetes mellitus with other diabetic kidney complication: Secondary | ICD-10-CM | POA: Diagnosis not present

## 2019-03-08 DIAGNOSIS — E559 Vitamin D deficiency, unspecified: Secondary | ICD-10-CM

## 2019-03-08 DIAGNOSIS — N183 Chronic kidney disease, stage 3 unspecified: Secondary | ICD-10-CM

## 2019-03-08 MED ORDER — VITAMIN D (ERGOCALCIFEROL) 1.25 MG (50000 UNIT) PO CAPS: 50000.0000 [IU] | ORAL_CAPSULE | ORAL | 3 refills | Status: DC

## 2019-03-08 NOTE — Progress Notes (Signed)
Virtual Visit via Telephone Note  I connected with Samuel Ryan on 03/08/19 at  9:00 AM EDT by telephone and verified that I am speaking with the correct person using two identifiers.   I discussed the limitations, risks, security and privacy concerns of performing an evaluation and management service by telephone. I also discussed with the patient that there may be a patient responsible charge related to this service. The patient expressed understanding and agreed to proceed.  Location of Patient: Home Location of Provider: Telehealth Consent was obtain for visit to be over via telehealth.   History of Present Illness:  Mr. Samuel Ryan is an 85 year old male patient of Dr. Moshe Cipro, he is well-known to the clinic.  Pertinent history of hypertension, atrial fibrillation, pulmonary fibrosis, diabetes, dementia without behavioral disturbance, CKD stage III, hyperlipidemia, obesity, urinary frequency, history of falls.  Today's telemedicine visit was performed via telephone and speaking with [name] and his son.  [name] says that he is doing good.  Reports that he has all good days.  But there is nothing going on or nothing wrong. His son reports that he has seen an increase in some confusion over the last couple weeks.  He reports that he is sleeping more.  No changes in eating or drinking.  No changes in urination pattern.  No falls.  No skin breakdown that was noted.  Reports taking all of his medications well and without difficulties.  Reports that when he takes his blood pressure his ranges 120 to the 130s over the 70s.  His atenolol was reduced to 25 mg at last visit.  He reports that he has not noticed him complaining of headaches, vision changes, chest pain, chest tightness, palpitations, leg swelling.  Son reports that when he checks his blood sugar his ranges the 100s to the 160s.  His last A1c back in December was 8.7%, demonstrating some uncontrolled measures.  Repeat labs were  not gathered secondary to the virus risk at this time.  His son reports that he is breathing well without difficulty.  Has not had any cough, shortness of breath, wheezing, chest tightness.  When asked about his memory, his son reports that he has noticed some increasing confusion.  He is also noticed that he has had some increase in sleepiness.  Son reports that he had recent job change.  He is now working day shift when he was working night shift.  And is working 12 hours.  He reports the change happening around the time that the lockdown and his schedule changed occurred.  Notices that his father cannot remember his new work schedule very easily.  But he is still recalling names and who everyone is.  Overall Samuel Ryan himself feels like he is doing good.  Overall his son feels like he is doing okay.  Is worried about the virus and does not want to get his recheck on his labs at this time.  Only concern was the increase in sleepiness and confusion.  Denies that his father has any fevers, chills, signs of congestion or breathing difficulties.  Reports that when he does work outside the home he immediately comes home takes a shower and removes all clothing that he was wearing outside of the home.  Trying to do his best to keep his father healthy.  Past Medical, Surgical, Social History, Allergies, and Medications have been Reviewed.    Observations/Objective: In conversation Samuel Ryan appears to be alert, disoriented to time.  Preserved memory of who he is talking with is intact.  As he is very familiar with Dr. Griffin Dakin office.   Assessment and Plan: 1. Alzheimer's disease of other onset without behavioral disturbance [facility]) [name] suffers from Alzheimer's dementia.  His son reports that he has had some increasing confusion and sleepiness.  Educated about the changes in dementia patients patterns and daily living can induce some extra confusion as well as the fact that he is no longer going  outside the home he might be coming board and therefore resting more.  Advised to monitor this to make sure that there is no behavior disturbance that he maintains eating and bathing.  Encouraged that this is just a new norm right now during this timeframe.  And also explained that when this locked down and quarantining is reversed and we go back to normal living as it was before he will also see some confusion and changes in behavior possibly related to the reversal.  Once things settle back down he should be okay.  Additionally spoke with son about the progression of dementia and its own.  And that whether this be related to changing in his pattern or if this is in relation to the progression we would not really know at this time.  Encouraged him to do some activities that he can do at home such as puzzles that are geared toward easy quick and fine put together and or possibly getting a couple of plantars and letting him plants some seeds or plants that he can take care of daily.  Advised to continue taking medication as directed.  If he notices any behaviors out of the way please let us know. Son is agreeable with this plan.  2. CKD (chronic kidney disease) stage 3, GFR 30-59 ml/min (HCC) Need to get updated labs as soon as son feels it is safe for his dad to come out.  Advised that this is no rush we understand the circumstances and the unprecedented times.  Educated him about signs and symptoms of lack of urination or retention of urination and to let us know if he sees any of these.  Encouraged him to maintain hydration as well as possible during these times.  3. Type 2 diabetes, uncontrolled, with renal manifestation ([place]) Reports that blood sugar levels seem to be within control.  But last A1c was not and well controlled.  We will have to wait for recheck of the A1c to see where he is currently sitting.  Would like to see him closer to 8%.  Advised to continue letting him eat and drink as he would  like.  Rather have him eating and drinking thant not eating and drinking.  Encouraged to maintain current medication regime.  4. Vitamin D deficiency Need a refill on vitamin D.  Reports that he is taking this medication okay.  - Vitamin D, Ergocalciferol, (DRISDOL) 1.25 MG (50000 UT) CAPS capsule; Take 1 capsule (50,000 Units total) by mouth every 7 (seven) days.  Dispense: 4 capsule; Refill: 3   Follow Up Instructions:  I discussed the assessment and treatment plan with the patient. The patient was provided an opportunity to ask questions and all were answered. The patient agreed with the plan and demonstrated an understanding of the instructions.   The patient was advised to call back or seek an in-person evaluation if the symptoms worsen or if the condition fails to improve as anticipated.  AVS will be mailed.   I provided 15  minutes of non-face-to-face time during this encounter.   Perlie Mayo, NP

## 2019-03-08 NOTE — Patient Instructions (Signed)
    Thank you for completing your visit today via telemedicine.  I appreciate the opportunity to provide you with the care for your health and wellness. Today we discussed: Overall health.  Some ideas for doing things at home include: Puzzles, plantars to plant seeds and or baby plants in, possible books of interest to topics he enjoys, cleaning or rearranging things.  If behavior does not change do not hesitate to reach out to [place].  If he starts acting out please let us know.  If there are any changes in sleep habits, eating or drinking, urination or bowel, or worsening of memory please just reach out thighs do not hesitate to reach out.  Please get labs as you can hopefully in the next several weeks this will start to dissipate and we can return back to normal.  Until then we understand about the labs during these unprecedented times.  Maintain all medications at this time if you have any difficulties or trouble with them or need refills just let us know.  Stay safe, stay home.  [place] YOUR HANDS WELL AND FREQUENTLY. AVOID TOUCHING YOUR FACE, UNLESS YOUR HANDS ARE FRESHLY WASHED.  GET FRESH AIR DAILY. STAY HYDRATED WITH WATER.   It was a pleasure to see you and I look forward to continuing to work together on your health and well-being. Please do not hesitate to call the office if you need care or have questions about your care.  Have a wonderful day and week. With Gratitude, Cherly Beach, DNP, AGNP-BC

## 2019-03-28 ENCOUNTER — Encounter: Payer: Self-pay | Admitting: Family Medicine

## 2019-03-28 ENCOUNTER — Ambulatory Visit (INDEPENDENT_AMBULATORY_CARE_PROVIDER_SITE_OTHER): Payer: Medicare HMO | Admitting: Family Medicine

## 2019-03-28 ENCOUNTER — Other Ambulatory Visit: Payer: Self-pay

## 2019-03-28 DIAGNOSIS — E119 Type 2 diabetes mellitus without complications: Secondary | ICD-10-CM

## 2019-03-28 DIAGNOSIS — Z794 Long term (current) use of insulin: Secondary | ICD-10-CM | POA: Diagnosis not present

## 2019-03-28 DIAGNOSIS — J470 Bronchiectasis with acute lower respiratory infection: Secondary | ICD-10-CM

## 2019-03-28 DIAGNOSIS — IMO0001 Reserved for inherently not codable concepts without codable children: Secondary | ICD-10-CM

## 2019-03-28 MED ORDER — AZITHROMYCIN 250 MG PO TABS: ORAL_TABLET | ORAL | 0 refills | Status: DC

## 2019-03-28 NOTE — Progress Notes (Signed)
Virtual Visit via Telephone Note   This visit type was conducted due to national recommendations for restrictions regarding the COVID-19 Pandemic (e.g. social distancing) in an effort to limit this patient's exposure and mitigate transmission in our community.  Due to his co-morbid illnesses, this patient is at least at moderate risk for complications without adequate follow up.  This format is felt to be most appropriate for this patient at this time.  The patient did not have access to video technology/had technical difficulties with video requiring transitioning to audio format only (telephone).  All issues noted in this document were discussed and addressed.  No physical exam could be performed with this format.    Evaluation Performed:  Follow-up visit  Date:  03/28/2019   ID:  Samuel Ryan, DOB March 01, 1931, MRN [id number]  Patient Location: Home Provider Location: Other:  Telemedicine  Location of Patient: Home Location of Provider: Telehealth Consent was obtain for visit to be over via telehealth. I verified that I am speaking with the correct person using two identifiers.  PCP:  [name], MD   Chief Complaint:  Wheezing and elevated blood sugars   History of Present Illness:    Samuel Ryan is a 85 y.o. male patient of Dr Simpson's. He has a history that includes dementia (requiring care), HTN, DM, a-fib, and CKD stage 3, among others. Today his son calls into due to new onset wheezing and elevation of Blood sugars, as he is normal well controlled and without wheezing. Son is concerned for infection.  Blood sugars have been in the upper 200 range: there is no cause food intake wise to have caused this increase.  The wheezing is new, he occasionally wheezed in the past but not like this.  Reports that he is having more wheezing when he is moving and trying to walk around. Denies having fevers or chills. Temp is normal. Does not look to be struggling to breath. Does not  have shortness of breath with communication. Mild cough with some mucus production-white. He is reported to have some congestion and stuffiness in the nasal passages.  Mucus has not been as bad now as it was. Increase in GERD medication. Has been on BID Prilosec. Tried to reduce to once daily, but this caused more cough and mucus.  He has not been able to use an inhaler in the past due to his dementia.  He is taking Claritin right now, but son thinks he might need to change this as he does not seem to be responding to it like he was.  Past Medical, Surgical, Social History, Allergies, and Medications have been Reviewed.   Past Medical History:  Diagnosis Date  . Acute cholecystitis 11/2012   Drained percutaneously  . Anxiety   . BPH (benign prostatic hypertrophy)   . Deep venous thrombosis (HCC)    Left leg, diagnosed 7/12  . Dementia [facility])    Needs supervision for safety per son - POA  . Diverticulitis   . Essential hypertension, benign   . GERD (gastroesophageal reflux disease)   . History of pneumonia    Prior to 2012  . Incontinence of urine   . Mixed hyperlipidemia   . Osteoarthritis   . Pulmonary embolism ([place])    Diagnosed 7/12  . Type 2 diabetes mellitus ([place])    Past Surgical History:  Procedure Laterality Date  . CARPAL TUNNEL RELEASE    . COLONOSCOPY  08/31/2012   Procedure: COLONOSCOPY;  Surgeon: Rogene Houston, MD;  Location: [facility] END[facility];  Service: Endoscopy;  Laterality: N/A;  830  . CYSTOSCOPY WITH INJECTION N/A 05/15/2018   Procedure: CYSTOSCOPY WITH BOTOX INJECTION;  Surgeon: Cleon Gustin, MD;  Location: [facility] ORS;  Service: Urology;  Laterality: N/A;  . CYSTOSCOPY WITH INJECTION N/A 11/10/2018   Procedure: CYSTOSCOPY WITH BOTOX  INJECTION;  Surgeon: Cleon Gustin, MD;  Location: [facility] ORS;  Service: Urology;  Laterality: N/A;  30 MINS  . CYSTOSCOPY WITH INSERTION OF UROLIFT N/A 09/02/2016   Procedure: CYSTOSCOPY WITH INSERTION OF UROLIFT;  Surgeon:  Cleon Gustin, MD;  Location[facility];  Service: Urology;  Laterality: N/A;  . EYE SURGERY Bilateral    bilateral cataract  . Right total knee replacement  04/23/2011  . SPINE SURGERY  prior to 2012   Neck fusion  . UMBILICAL HERNIA REPAIR     umbilical     No outpatient medications have been marked as taking for the 03/28/19 encounter (Office Visit) with Perlie Mayo, NP.     Allergies:   Zosyn [piperacillin sod-tazobactam so]; Ace inhibitors; and Vancomycin   Social History   Tobacco Use  . Smoking status: Never Smoker  . Smokeless tobacco: Never Used  Substance Use Topics  . Alcohol use: No  . Drug use: No     Family Hx: The patient's family history includes Cancer in his brother and mother; Diabetes in his brother and sister; Hypertension in his sister; Seizures in his son.  ROS:   Please see the history of present illness.    All other systems reviewed and are negative.   Labs/Other Tests and Data Reviewed:    Recent Labs: 09/22/2018: TSH 0.441 09/25/2018: Magnesium 2.2 11/21/2018: ALT 17; B Natriuretic Peptide 208.0; Hemoglobin 10.6; Platelets 198 11/24/2018: BUN 15; Creat 1.77; Potassium 4.6; Sodium 138   Recent Lipid Panel Lab Results  Component Value Date/Time   CHOL 112 09/24/2018 06:03 AM   TRIG 98 09/24/2018 06:03 AM   HDL 27 (L) 09/24/2018 06:03 AM   CHOLHDL 4.1 09/24/2018 06:03 AM   LDLCALC 65 09/24/2018 06:03 AM   LDLCALC 85 05/29/2018 08:40 AM    Wt Readings from Last 3 Encounters:  01/15/19 256 lb 4.8 oz (116.3 kg)  11/28/18 258 lb (117 kg)  11/20/18 260 lb 3.2 oz (118 kg)     Objective:    Vital Signs:  There were no vitals taken for this visit.   GEN:  alert RESPIRATORY:  cough heard, no noted shortness of breath PSYCH:  poor historian, poor communication, son provides most of the detail due to dementia.  ASSESSMENT & PLAN:     1. Bronchiectasis with acute lower respiratory infection ([place]) Symptoms  presented today seem consistent with bronchiectasis with start of infection.  His mucus production is still ongoing even with maintenance of allergy medication and anti-reflux medication.  Secondary to the extensive comorbidities will provide coverage for this.  Educated son on medication use and need to complete course.  Reviewed side effects, risks and benefits of medication.   Patient and son acknowledged agreement and understanding of the plan.  Discussed in detail that if wheezing continued even after the use of medication strongly would suggest the change of Claritin to Zyrtec, along with possible starting of nebulizer treatment since hand-held inhalers have not been of benefit.  - azithromycin (ZITHROMAX) 250 MG tablet; Please take two tablets on the first day, and 1 tablet the remain four days.Complete  the full course.  Dispense: 6 each; Refill: 0   2. Diabetes mellitus, insulin dependent (IDDM), controlled ([place]) Advised to continue monitoring blood sugars.  If do not see an improvement in blood sugar levels advised to contact us back.  Educated that some elevation is normal if infection is present.  Continue current regime.   Time:   Today, I have spent 10 minutes with the patient with telehealth technology discussing the above problems.     Medication Adjustments/Labs and Tests Ordered: Current medicines are reviewed at length with the patient today.  Concerns regarding medicines are outlined above.   Tests Ordered: No orders of the defined types were placed in this encounter.   Medication Changes: Meds ordered this encounter  Medications  . azithromycin (ZITHROMAX) 250 MG tablet    Sig: Please take two tablets on the first day, and 1 tablet the remain four days.Complete the full course.    Dispense:  6 each    Refill:  0    Order Specific Question:   Supervising Provider    Answer:   [name] [[id number]]    Disposition:  Follow up in 2 month(s) or as  needed  Signed, Perlie Mayo, NP  03/28/2019 2:02 PM     [facility]

## 2019-03-28 NOTE — Patient Instructions (Addendum)
    Thank you for completing your visit today via telephone. I appreciate the opportunity to provide you with the care for your health and wellness. Today we discussed: overall health-cough, wheeze, and elevated blood sugars.  Overall glad that you are doing well.  Sorry to hear about the wheezing and coughing of mucus along with elevated blood sugars.  With history and current situation of what is going on and risk of worsening condition, we have prescribed an antibiotic to be taken over the next several days.  Please take this antibiotic in full.  And call us back if the condition does not improve or worsens.  Additionally please continue to monitor blood sugar.  If these do not return to normal after questionable infection is treated please let us know.  Otherwise please continue current medication regime.  If you develop a fever, shortness of breath please let us know.  If you develop difficulty breathing and cannot get area please call 911.   Please continue to practice social distancing.  [place] YOUR HANDS WELL AND FREQUENTLY. AVOID TOUCHING YOUR FACE, UNLESS YOUR HANDS ARE FRESHLY WASHED.  GET FRESH AIR DAILY. STAY HYDRATED WITH WATER.   It was a pleasure to see you and I look forward to continuing to work together on your health and well-being. Please do not hesitate to call the office if you need care or have questions about your care.  Have a wonderful day and week. With Gratitude, Cherly Beach, DNP, AGNP-BC

## 2019-03-31 ENCOUNTER — Other Ambulatory Visit: Payer: Self-pay | Admitting: Family Medicine

## 2019-04-02 DIAGNOSIS — E1129 Type 2 diabetes mellitus with other diabetic kidney complication: Secondary | ICD-10-CM | POA: Diagnosis not present

## 2019-04-02 DIAGNOSIS — G308 Other Alzheimer's disease: Secondary | ICD-10-CM | POA: Diagnosis not present

## 2019-04-02 DIAGNOSIS — R0902 Hypoxemia: Secondary | ICD-10-CM | POA: Diagnosis not present

## 2019-04-02 DIAGNOSIS — F329 Major depressive disorder, single episode, unspecified: Secondary | ICD-10-CM | POA: Diagnosis not present

## 2019-04-06 ENCOUNTER — Other Ambulatory Visit: Payer: Self-pay | Admitting: Family Medicine

## 2019-04-12 ENCOUNTER — Other Ambulatory Visit: Payer: Self-pay | Admitting: Family Medicine

## 2019-04-13 ENCOUNTER — Other Ambulatory Visit: Payer: Self-pay | Admitting: Family Medicine

## 2019-04-24 ENCOUNTER — Ambulatory Visit (INDEPENDENT_AMBULATORY_CARE_PROVIDER_SITE_OTHER): Payer: Medicare HMO | Admitting: Family Medicine

## 2019-04-24 ENCOUNTER — Other Ambulatory Visit: Payer: Self-pay

## 2019-04-24 ENCOUNTER — Encounter: Payer: Self-pay | Admitting: Family Medicine

## 2019-04-24 VITALS — BP 126/80 | Ht 71.0 in | Wt 256.0 lb

## 2019-04-24 DIAGNOSIS — R0989 Other specified symptoms and signs involving the circulatory and respiratory systems: Secondary | ICD-10-CM

## 2019-04-24 DIAGNOSIS — E559 Vitamin D deficiency, unspecified: Secondary | ICD-10-CM | POA: Diagnosis not present

## 2019-04-24 DIAGNOSIS — E1121 Type 2 diabetes mellitus with diabetic nephropathy: Secondary | ICD-10-CM | POA: Diagnosis not present

## 2019-04-24 DIAGNOSIS — M159 Polyosteoarthritis, unspecified: Secondary | ICD-10-CM

## 2019-04-24 DIAGNOSIS — E1165 Type 2 diabetes mellitus with hyperglycemia: Secondary | ICD-10-CM | POA: Diagnosis not present

## 2019-04-24 DIAGNOSIS — I10 Essential (primary) hypertension: Secondary | ICD-10-CM

## 2019-04-24 DIAGNOSIS — N3000 Acute cystitis without hematuria: Secondary | ICD-10-CM | POA: Diagnosis not present

## 2019-04-24 DIAGNOSIS — I471 Supraventricular tachycardia: Secondary | ICD-10-CM

## 2019-04-24 DIAGNOSIS — Z794 Long term (current) use of insulin: Secondary | ICD-10-CM

## 2019-04-24 DIAGNOSIS — E119 Type 2 diabetes mellitus without complications: Secondary | ICD-10-CM | POA: Diagnosis not present

## 2019-04-24 DIAGNOSIS — IMO0002 Reserved for concepts with insufficient information to code with codable children: Secondary | ICD-10-CM

## 2019-04-24 DIAGNOSIS — E785 Hyperlipidemia, unspecified: Secondary | ICD-10-CM | POA: Diagnosis not present

## 2019-04-24 MED ORDER — BENZONATATE 100 MG PO CAPS: 100.0000 mg | ORAL_CAPSULE | Freq: Two times a day (BID) | ORAL | 3 refills | Status: DC | PRN

## 2019-04-24 NOTE — Patient Instructions (Addendum)
F/U with MD in 3.5 months, call if you need me before  Son needs to reschedule  June appt please call him on his cell and reschedule, the appt is with Jarrett Soho  Cholesterol is excellent and liver function good, kidney looks better  We will attempt a sooner appt with Dr Domenic Polite, and let you know  Your  son  will be contacted with blood sugar result by tomorrow CCUA and reflex culture is being ordered  Social distancing. Frequent hand washing with soap and water Keeping your hands off of your face. These 3 practices will help to keep both you and your community healthy during this time. Please practice them faithfully!

## 2019-04-24 NOTE — Progress Notes (Signed)
Virtual Visit via Telephone Note  I connected with Samuel Ryan on 04/24/19 at  3:20 PM EDT by telephone and verified that I am speaking with the correct person using two identifiers.  Location: Patient: home Provider: office   I discussed the limitations, risks, security and privacy concerns of performing an evaluation and management service by telephone and the availability of in person appointments. I also discussed with the patient that there may be a patient responsible charge related to this service. The patient expressed understanding and agreed to proceed. This visit type is conducted due to national recommendations for restrictions regarding the COVID -19 Pandemic. Due to the patient's age and / or co morbidities, this format is felt to be most appropriate at this time without adequate follow up. The patient has no access to video technology/ had technical difficulties with video, requiring transitioning to audio format  only ( telephone ). All issues noted this document were discussed and addressed,no physical exam can be performed in this format.    History of Present Illness: History from patient and also from his son 2 week h/o feeling badly intermittently, c/o swimmy head Fasting ranging from 137 to 200 States has got BP at home as high as 179/108, and blood sugar was up to 300 Concerned that blood sugar off x 6 weeks Excess choking with clear phlegm, , no fever or chills ,  Denies recent fever or chills. Denies sinus pressure, nasal congestion, ear pain or sore throat. Denies chest congestion, productive cough or wheezing. Denies leg swelling and chest pain Denies abdominal pain, nausea, vomiting,diarrhea or constipation.   C/o mild  Dysuria and  frequency C/o chronic  joint pain, swelling and limitation in mobility. Denies headaches, seizures, numbness, or tingling. Denies uncontrolled depression, anxiety or insomnia. Denies skin break down or rash.        Observations/Objective:  BP 126/80   Ht 5\' 11"  (1.803 m)   Wt 256 lb (116.1 kg)   BMI 35.70 kg/m  Good communication with no confusion and impaired  Memory.( baseline) Alert and oriented x 3 No signs of respiratory distress during sppech   Assessment and Plan: Acute cystitis without hematuria 2 week h/o increased fatigue with urinary frequency, denies fever, nausea or vomit. Has h/o recurrent UTI , will test for this  Essential hypertension Reports marked fluctuations in blood pressure , with episodic light headedness x 2 weeks, wil try to get sooner appt with card, may need ambulatory BP monitorting  Hyperlipidemia with target LDL less than 100 Hyperlipidemia:Low fat diet discussed and encouraged.   Lipid Panel  Lab Results  Component Value Date   CHOL 130 04/24/2019   HDL 34 (L) 04/24/2019   LDLCALC 76 04/24/2019   TRIG 115 04/24/2019   CHOLHDL 3.8 04/24/2019  controlled, increased activity as tolerated recommended     Uncontrolled diabetes mellitus with diabetic nephropathy, with long-term current use of insulin (HCC) Deteriorated, med adjustmetn made to inc glipizide to 20 mg daily Needs to be more careful with diet also Samuel Ryan is reminded of the importance of commitment to daily physical activity for 30 minutes or more, as able and the need to limit carbohydrate intake to 30 to 60 grams per meal to help with blood sugar control.   The need to take medication as prescribed, test blood sugar as directed, and to call between visits if there is a concern that blood sugar is uncontrolled is also discussed.   Samuel Ryan is reminded of  the importance of daily foot exam, annual eye examination, and good blood sugar, blood pressure and cholesterol control.  Diabetic Labs Latest Ref Rng & Units 04/24/2019 11/30/2018 11/24/2018 11/21/2018 10/05/2018  HbA1c <5.7 % of total Hgb 10.0(H) - 8.7(H) - -  Microalbumin mg/dL 1.3 0.8 - - -  Micro/Creat Ratio <30 mcg/mg creat 22 9 -  - -  Chol <200 mg/dL 130 - - - -  HDL > OR = 40 mg/dL 34(L) - - - -  Calc LDL mg/dL (calc) 76 - - - -  Triglycerides <150 mg/dL 115 - - - -  Creatinine 0.70 - 1.11 mg/dL 1.61(H) - 1.77(H) 1.48(H) 1.69(H)   BP/Weight 04/24/2019 01/15/2019 11/28/2018 11/21/2018 11/20/2018 11/17/2018 24/26/8341  Systolic BP 962 229 798 921 - 194 174  Diastolic BP 80 67 78 87 - 75 88  Wt. (Lbs) 256 256.3 258 - 260.2 260.2 256  BMI 35.7 35.75 37.02 - 37.33 37.33 36.73   Foot/eye exam completion dates Latest Ref Rng & Units 01/24/2018 09/22/2015  Eye Exam No Retinopathy - No Retinopathy  Foot Form Completion - Done -        Generalized osteoarthritis Fall precautions reviewed briefly  Paroxysmal SVT (supraventricular tachycardia) (HCC) Report of frequent episodes of markedly elevated blood pressure episodically , as well as light headedness , and increased sleepiness, refer for cardiology eval, request sooner appt  Labile hypertension Reports from home BP readings that increased frequency of episodic markedly uncontrolled BP, refer to cardiology  Morbid obesity ([place]) Obesity linked with hypertension and IDDM  Patient re-educated about  the importance of commitment to a  minimum of 150 minutes of exercise per week as able.  The importance of healthy food choices with portion control discussed, as well as eating regularly and within a 12 hour window most days. The need to choose "clean , green" food 50 to 75% of the time is discussed, as well as to make water the primary drink and set a goal of 64 ounces water daily.  Encouraged to start a food diary,  and to consider  joining a support group. Sample diet sheets offered. Goals set by the patient for the next several months.   Weight /BMI 04/24/2019 01/15/2019 11/28/2018  WEIGHT 256 lb 256 lb 4.8 oz 258 lb  HEIGHT 5\' 11"  5\' 11"  5\' 10"   BMI 35.7 kg/m2 35.75 kg/m2 37.02 kg/m2         Follow Up Instructions:    I discussed the assessment and  treatment plan with the patient. The patient was provided an opportunity to ask questions and all were answered. The patient agreed with the plan and demonstrated an understanding of the instructions.   The patient was advised to call back or seek an in-person evaluation if the symptoms worsen or if the condition fails to improve as anticipated.  I provided 25 minutes of non-face-to-face time during this encounter.   Tula Nakayama, MD

## 2019-04-25 LAB — MICROALBUMIN / CREATININE URINE RATIO
Creatinine, Urine: 60 mg/dL (ref 20–320)
Microalb Creat Ratio: 22 mcg/mg creat (ref <30)
Microalb, Ur: 1.3 mg/dL

## 2019-04-26 ENCOUNTER — Other Ambulatory Visit: Payer: Self-pay

## 2019-04-26 ENCOUNTER — Other Ambulatory Visit: Payer: Self-pay | Admitting: Family Medicine

## 2019-04-26 MED ORDER — GLIPIZIDE ER 10 MG PO TB24: ORAL_TABLET | ORAL | 3 refills | Status: DC

## 2019-04-26 NOTE — Progress Notes (Signed)
Glucotrol  

## 2019-04-27 ENCOUNTER — Telehealth: Payer: Self-pay | Admitting: *Deleted

## 2019-04-27 LAB — COMPLETE METABOLIC PANEL WITH GFR
AG Ratio: 0.9 (calc) — ABNORMAL LOW (ref 1.0–2.5)
ALT: 12 U/L (ref 9–46)
AST: 22 U/L (ref 10–35)
Albumin: 3.5 g/dL — ABNORMAL LOW (ref 3.6–5.1)
Alkaline phosphatase (APISO): 67 U/L (ref 35–144)
BUN/Creatinine Ratio: 11 (calc) (ref 6–22)
BUN: 17 mg/dL (ref 7–25)
CO2: 30 mmol/L (ref 20–32)
CREATININE: 1.61 mg/dL — AB (ref 0.70–1.11)
Calcium: 8.9 mg/dL (ref 8.6–10.3)
Chloride: 102 mmol/L (ref 98–110)
GFR, Est African American: 44 mL/min/{1.73_m2} — ABNORMAL LOW (ref >=60)
GFR, Est Non African American: 38 mL/min/{1.73_m2} — ABNORMAL LOW (ref >=60)
Globulin: 3.7 g/dL (calc) (ref 1.9–3.7)
Glucose, Bld: 142 mg/dL — ABNORMAL HIGH (ref 65–139)
Potassium: 4.5 mmol/L (ref 3.5–5.3)
Sodium: 138 mmol/L (ref 135–146)
Total Bilirubin: 0.9 mg/dL (ref 0.2–1.2)
Total Protein: 7.2 g/dL (ref 6.1–8.1)

## 2019-04-27 LAB — MANUAL DIFFERENTIAL
HCT: 35.6 % — ABNORMAL LOW (ref 38.5–50.0)
Hemoglobin: 11.7 g/dL — ABNORMAL LOW (ref 13.2–17.1)
MCH: 31.7 pg (ref 27.0–33.0)
MCHC: 32.9 g/dL (ref 32.0–36.0)
MCV: 96.5 fL (ref 80.0–100.0)
MPV: 10.9 fL (ref 7.5–12.5)
Platelets: 167 10*3/uL (ref 140–400)
RBC: 3.69 10*6/uL — ABNORMAL LOW (ref 4.20–5.80)
RDW: 14.3 % (ref 11.0–15.0)
WBC: 4.6 10*3/uL (ref 3.8–10.8)

## 2019-04-27 LAB — DIFFERENTIAL, MANUAL RFLX
ABSOLUTE BAND NEUTROPHILS: 184 cells/uL (ref 0–750)
Absolute Lymphocytes: 1776 cells/uL (ref 850–3900)
Absolute Monocytes: 409 cells/uL (ref 200–950)
Absolute Reactive Lymphocytes: 409 cells/uL — ABNORMAL HIGH
Band Neutrophils: 4 %
Basophils Absolute: 0 cells/uL (ref 0–200)
Basophils Relative: 0 %
Eosinophils Absolute: 92 cells/uL (ref 15–500)
Eosinophils Relative: 2 %
LUCs, %: 8.9 % (ref 0–10)
Monocytes Relative: 8.9 %
Neutro Abs: 1730 cells/uL (ref 1500–7800)
Neutrophils Relative %: 37.6 %
Total Lymphocyte: 38.6 %

## 2019-04-27 LAB — LIPID PANEL
Cholesterol: 130 mg/dL (ref <200)
HDL: 34 mg/dL — ABNORMAL LOW (ref >=40)
LDL Cholesterol (Calc): 76 mg/dL (calc)
Non-HDL Cholesterol (Calc): 96 mg/dL (calc) (ref <130)
Total CHOL/HDL Ratio: 3.8 (calc) (ref <5.0)
Triglycerides: 115 mg/dL (ref <150)

## 2019-04-27 LAB — HEMOGLOBIN A1C
Hgb A1c MFr Bld: 10 % of total Hgb — ABNORMAL HIGH (ref <5.7)
Mean Plasma Glucose: 240 (calc)
eAG (mmol/L): 13.3 (calc)

## 2019-04-27 LAB — VITAMIN D 25 HYDROXY (VIT D DEFICIENCY, FRACTURES): Vit D, 25-Hydroxy: 51 ng/mL (ref 30–100)

## 2019-04-27 LAB — URINALYSIS W MICROSCOPIC + REFLEX CULTURE

## 2019-04-27 NOTE — Telephone Encounter (Signed)
Samuel Ryan called about his father Samuel Ryan. Wanted to know the urine test results.

## 2019-04-27 NOTE — Telephone Encounter (Signed)
atoiill awaiting culture report, takes at least 3 days on average, let him know please ,

## 2019-04-27 NOTE — Telephone Encounter (Signed)
Spoke with son and let him know that culture results haven't come back yet

## 2019-04-29 ENCOUNTER — Encounter: Payer: Self-pay | Admitting: Family Medicine

## 2019-04-29 DIAGNOSIS — R0989 Other specified symptoms and signs involving the circulatory and respiratory systems: Secondary | ICD-10-CM | POA: Insufficient documentation

## 2019-04-29 NOTE — Assessment & Plan Note (Signed)
Reports from home BP readings that increased frequency of episodic markedly uncontrolled BP, refer to cardiology

## 2019-04-29 NOTE — Assessment & Plan Note (Signed)
Reports marked fluctuations in blood pressure , with episodic light headedness x 2 weeks, wil try to get sooner appt with card, may need ambulatory BP monitorting

## 2019-04-29 NOTE — Assessment & Plan Note (Signed)
Fall precautions reviewed briefly

## 2019-04-29 NOTE — Assessment & Plan Note (Signed)
Report of frequent episodes of markedly elevated blood pressure episodically , as well as light headedness , and increased sleepiness, refer for cardiology eval, request sooner appt

## 2019-04-29 NOTE — Assessment & Plan Note (Signed)
Hyperlipidemia:Low fat diet discussed and encouraged.   Lipid Panel  Lab Results  Component Value Date   CHOL 130 04/24/2019   HDL 34 (L) 04/24/2019   LDLCALC 76 04/24/2019   TRIG 115 04/24/2019   CHOLHDL 3.8 04/24/2019  controlled, increased activity as tolerated recommended

## 2019-04-29 NOTE — Assessment & Plan Note (Signed)
2 week h/o increased fatigue with urinary frequency, denies fever, nausea or vomit. Has h/o recurrent UTI , will test for this

## 2019-04-29 NOTE — Assessment & Plan Note (Signed)
Obesity linked with hypertension and IDDM  Patient re-educated about  the importance of commitment to a  minimum of 150 minutes of exercise per week as able.  The importance of healthy food choices with portion control discussed, as well as eating regularly and within a 12 hour window most days. The need to choose "clean , green" food 50 to 75% of the time is discussed, as well as to make water the primary drink and set a goal of 64 ounces water daily.  Encouraged to start a food diary,  and to consider  joining a support group. Sample diet sheets offered. Goals set by the patient for the next several months.   Weight /BMI 04/24/2019 01/15/2019 11/28/2018  WEIGHT 256 lb 256 lb 4.8 oz 258 lb  HEIGHT 5\' 11"  5\' 11"  5\' 10"   BMI 35.7 kg/m2 35.75 kg/m2 37.02 kg/m2

## 2019-04-29 NOTE — Assessment & Plan Note (Signed)
Deteriorated, med adjustmetn made to inc glipizide to 20 mg daily Needs to be more careful with diet also Samuel Ryan is reminded of the importance of commitment to daily physical activity for 30 minutes or more, as able and the need to limit carbohydrate intake to 30 to 60 grams per meal to help with blood sugar control.   The need to take medication as prescribed, test blood sugar as directed, and to call between visits if there is a concern that blood sugar is uncontrolled is also discussed.   Samuel Ryan is reminded of the importance of daily foot exam, annual eye examination, and good blood sugar, blood pressure and cholesterol control.  Diabetic Labs Latest Ref Rng & Units 04/24/2019 11/30/2018 11/24/2018 11/21/2018 10/05/2018  HbA1c <5.7 % of total Hgb 10.0(H) - 8.7(H) - -  Microalbumin mg/dL 1.3 0.8 - - -  Micro/Creat Ratio <30 mcg/mg creat 22 9 - - -  Chol <200 mg/dL 130 - - - -  HDL > OR = 40 mg/dL 34(L) - - - -  Calc LDL mg/dL (calc) 76 - - - -  Triglycerides <150 mg/dL 115 - - - -  Creatinine 0.70 - 1.11 mg/dL 1.61(H) - 1.77(H) 1.48(H) 1.69(H)   BP/Weight 04/24/2019 01/15/2019 11/28/2018 11/21/2018 11/20/2018 11/17/2018 76/81/1572  Systolic BP 620 355 974 163 - 845 364  Diastolic BP 80 67 78 87 - 75 88  Wt. (Lbs) 256 256.3 258 - 260.2 260.2 256  BMI 35.7 35.75 37.02 - 37.33 37.33 36.73   Foot/eye exam completion dates Latest Ref Rng & Units 01/24/2018 09/22/2015  Eye Exam No Retinopathy - No Retinopathy  Foot Form Completion - Done -

## 2019-05-01 ENCOUNTER — Telehealth: Payer: Self-pay

## 2019-05-01 ENCOUNTER — Telehealth: Payer: Self-pay | Admitting: *Deleted

## 2019-05-01 DIAGNOSIS — N3000 Acute cystitis without hematuria: Secondary | ICD-10-CM

## 2019-05-01 NOTE — Telephone Encounter (Signed)
pls f/u c/s , not in system yet, also document new blood sugar results from son

## 2019-05-01 NOTE — Telephone Encounter (Signed)
Samuel Ryan had submitted a urine to the lab and a microalbumin was sent by the lab so I attempted to add the culture on and it couldn't be added on because the urine has to be sent in a special culture tube and the microalbumin is received in a regular specimen cup

## 2019-05-01 NOTE — Telephone Encounter (Signed)
Error here? As they did do a CCUA on the same specimen dated 5/26, no microalb was due at that time either, need [place] to follow thru, pt will need to resubmit urine c/s only if canm be run

## 2019-05-01 NOTE — Telephone Encounter (Signed)
Pts son Chase Caller called. Wanting to know if the urine results have came back and also wanted to let Dr. Moshe Cipro know that he is still feeling very tired and complaining.

## 2019-05-02 DIAGNOSIS — N3000 Acute cystitis without hematuria: Secondary | ICD-10-CM | POA: Diagnosis not present

## 2019-05-02 NOTE — Telephone Encounter (Signed)
Labs ordered to be done today

## 2019-05-02 NOTE — Telephone Encounter (Signed)
Discussed in person with MD and CCUA with reflex culture and son collected the specimen cup

## 2019-05-03 DIAGNOSIS — G308 Other Alzheimer's disease: Secondary | ICD-10-CM | POA: Diagnosis not present

## 2019-05-03 DIAGNOSIS — E1129 Type 2 diabetes mellitus with other diabetic kidney complication: Secondary | ICD-10-CM | POA: Diagnosis not present

## 2019-05-03 DIAGNOSIS — R0902 Hypoxemia: Secondary | ICD-10-CM | POA: Diagnosis not present

## 2019-05-03 DIAGNOSIS — F329 Major depressive disorder, single episode, unspecified: Secondary | ICD-10-CM | POA: Diagnosis not present

## 2019-05-03 LAB — URINALYSIS W MICROSCOPIC + REFLEX CULTURE
Bacteria, UA: NONE SEEN /HPF
Bilirubin Urine: NEGATIVE
Glucose, UA: NEGATIVE
Hgb urine dipstick: NEGATIVE
Hyaline Cast: NONE SEEN /LPF
Ketones, ur: NEGATIVE
Leukocyte Esterase: NEGATIVE
Nitrites, Initial: NEGATIVE
Protein, ur: NEGATIVE
Specific Gravity, Urine: 1.012 (ref 1.001–1.03)
Squamous Epithelial / HPF: NONE SEEN /HPF
WBC, UA: NONE SEEN /HPF (ref 0–5)
pH: 6.5 (ref 5.0–8.0)

## 2019-05-03 LAB — NO CULTURE INDICATED

## 2019-05-08 ENCOUNTER — Other Ambulatory Visit: Payer: Self-pay | Admitting: Family Medicine

## 2019-05-09 ENCOUNTER — Telehealth: Payer: Self-pay | Admitting: *Deleted

## 2019-05-09 NOTE — Telephone Encounter (Signed)
keep phone visit if available pls

## 2019-05-09 NOTE — Telephone Encounter (Signed)
Chase Caller (pts son) called said he was concerned as he felt like Samuel Ryan had sinuses going on. He said his head is still hurting and and he noticed that his nose is still stopped up and he has taken mucinex and tried vicks. He has had no fever and he does not have a cough. I offered to put him on the schedule for phone visit tomorrow with Dr. Moshe Cipro. He wanted to see what the nurse said before making the appt for the phone visit.

## 2019-05-09 NOTE — Telephone Encounter (Signed)
Per son, was advised to contact cardiology about symptoms of continuing SOB and fatigue since decrease in atenolol. Per son, SOB and fatigue has not improved and seems to be a little worse. Per son, O2 sat 90 or > on room air, HR 69 or better, BP has been ranging around 133/85; 139/92. Per son, does not have a pulmonologist. Reports congestion and stuffy nose related to allergies and dizziness at times. Denies fever, n/v, chest pain, palpitations. Advised to keep upcoming appt 05/18/2019 with Domenic Polite and if symptoms got worse, to go to the ED for an evaluation. Verbalized understanding of plan.

## 2019-05-09 NOTE — Telephone Encounter (Signed)
Spoke with Chase Caller (pt son) he agreed to keep phone visit for 05-10-19

## 2019-05-10 ENCOUNTER — Other Ambulatory Visit: Payer: Self-pay

## 2019-05-10 ENCOUNTER — Ambulatory Visit (INDEPENDENT_AMBULATORY_CARE_PROVIDER_SITE_OTHER): Payer: Medicare HMO | Admitting: Family Medicine

## 2019-05-10 ENCOUNTER — Encounter: Payer: Self-pay | Admitting: Family Medicine

## 2019-05-10 VITALS — BP 126/80 | Ht 71.0 in | Wt 256.0 lb

## 2019-05-10 DIAGNOSIS — J329 Chronic sinusitis, unspecified: Secondary | ICD-10-CM

## 2019-05-10 DIAGNOSIS — E1165 Type 2 diabetes mellitus with hyperglycemia: Secondary | ICD-10-CM | POA: Diagnosis not present

## 2019-05-10 DIAGNOSIS — G309 Alzheimer's disease, unspecified: Secondary | ICD-10-CM

## 2019-05-10 DIAGNOSIS — Z9181 History of falling: Secondary | ICD-10-CM | POA: Diagnosis not present

## 2019-05-10 DIAGNOSIS — E1121 Type 2 diabetes mellitus with diabetic nephropathy: Secondary | ICD-10-CM

## 2019-05-10 DIAGNOSIS — Z794 Long term (current) use of insulin: Secondary | ICD-10-CM | POA: Diagnosis not present

## 2019-05-10 DIAGNOSIS — I10 Essential (primary) hypertension: Secondary | ICD-10-CM

## 2019-05-10 DIAGNOSIS — IMO0002 Reserved for concepts with insufficient information to code with codable children: Secondary | ICD-10-CM

## 2019-05-10 DIAGNOSIS — F0280 Dementia in other diseases classified elsewhere without behavioral disturbance: Secondary | ICD-10-CM

## 2019-05-10 MED ORDER — DOXYCYCLINE HYCLATE 100 MG PO TABS: 100.0000 mg | ORAL_TABLET | Freq: Two times a day (BID) | ORAL | 0 refills | Status: DC

## 2019-05-10 NOTE — Patient Instructions (Signed)
F/u with telephone visit with mD in 7 to 10 days, please verify appointment with son, based on his schedule  1 week antibiotic course, (doxycycline) is prescribed for sinus infection  Increase Lantus to 15  Units once daily, starting today, continue glipizide as before  If on Monday,05/14/2019,  the blood sugar is still too high, increase lantus to 18 units once daily  Goal for fasting blood sugar ranges from 100 to 140 and 2 hours after any meal or at bedtime should be between 150 to 180.  Holding on physical therapy until medically improved  You will need to go to the ED for evaluation if you worsen or do not improve  Thanks for choosing [facility], we consider it a privelige to serve you.

## 2019-05-11 ENCOUNTER — Encounter: Payer: Self-pay | Admitting: Family Medicine

## 2019-05-11 DIAGNOSIS — J329 Chronic sinusitis, unspecified: Secondary | ICD-10-CM | POA: Insufficient documentation

## 2019-05-11 NOTE — Assessment & Plan Note (Signed)
Controlled, no change in medication  

## 2019-05-11 NOTE — Progress Notes (Signed)
Virtual Visit via Telephone Note  I connected with Samuel Ryan on 05/11/19 at  9:40 AM EDT by telephone and verified that I am speaking with the correct person using two identifiers.  Location: Patient: home Provider: office   I discussed the limitations, risks, security and privacy concerns of performing an evaluation and management service by telephone and the availability of in person appointments. I also discussed with the patient that there may be a patient responsible charge related to this service. The patient expressed understanding and agreed to proceed.   History of Present Illness: Son provides history o with concerns stated  Denies recent fever or chills. Denies chest congestion, productive cough or wheezing. Denies chest pains, palpitations and leg swelling Denies abdominal pain, nausea, vomiting,diarrhea or constipation.  C/o poor appetitie Chronic  hesitancy and  incontinence. Chronic  joint pain, swelling and limitation in mobility.  Denies depression, anxiety or insomnia. Denies skin break down or rash.       Observations/Objective: BP 126/80   Ht 5\' 11"  (1.803 m)   Wt 256 lb (116.1 kg)   BMI 35.70 kg/m     Assessment and Plan: Sinusitis Over 2 week h/o sinus congestion, worsening reportedly, with foul tasting post nasal drainage , increased fatigue and reduced  appetite  Antibiotic course prescribed, I also advised son that because of the multiple voiced symptoms, he should take him to the ED for evaluation if his condition continues to deteriorate, as stated concerns are significant. States he is " afraid of the hospital" and of inability to visit Dad should he be admitted, I reassured him that neither of these concerns need to be a reason to keep him from Ed eval should the need arise  Uncontrolled diabetes mellitus with diabetic nephropathy, with long-term current use of insulin ([place]) Blood sugar levels still elevated, increase insulin dose to 15 units  x 4 days, if elevated blood sugar persists, then increase too 18 units, re eval in 7 to 10 days  Essential hypertension Controlled, no change in medication   At high risk for falls Reportedly weak and son's reques for PT is being held at this time, until acute medical illness/ instability improves, will re address at next visit  Alzheimer's dementia without behavioral disturbance (HCC) Currently stable on medication, no behavioral disturbance noted    Follow Up Instructions:    I discussed the assessment and treatment plan with the patient. The patient was provided an opportunity to ask questions and all were answered. The patient agreed with the plan and demonstrated an understanding of the instructions.   The patient was advised to call back or seek an in-person evaluation if the symptoms worsen or if the condition fails to improve as anticipated.  I provided 22 minutes of non-face-to-face time during this encounter.   Tula Nakayama, MD

## 2019-05-11 NOTE — Assessment & Plan Note (Signed)
Currently stable on medication, no behavioral disturbance noted

## 2019-05-11 NOTE — Assessment & Plan Note (Signed)
Blood sugar levels still elevated, increase insulin dose to 15 units x 4 days, if elevated blood sugar persists, then increase too 18 units, re eval in 7 to 10 days

## 2019-05-11 NOTE — Assessment & Plan Note (Signed)
Reportedly weak and son's reques for PT is being held at this time, until acute medical illness/ instability improves, will re address at next visit

## 2019-05-11 NOTE — Assessment & Plan Note (Signed)
Over 2 week h/o sinus congestion, worsening reportedly, with foul tasting post nasal drainage , increased fatigue and reduced  appetite  Antibiotic course prescribed, I also advised son that because of the multiple voiced symptoms, he should take him to the ED for evaluation if his condition continues to deteriorate, as stated concerns are significant. States he is " afraid of the hospital" and of inability to visit Dad should he be admitted, I reassured him that neither of these concerns need to be a reason to keep him from Ed eval should the need arise

## 2019-05-15 ENCOUNTER — Telehealth: Payer: Self-pay | Admitting: Cardiology

## 2019-05-15 NOTE — Telephone Encounter (Signed)
Virtual Visit Pre-Appointment Phone Call  "(Name), I am calling you today to discuss your upcoming appointment. We are currently trying to limit exposure to the virus that causes COVID-19 by seeing patients at home rather than in the office."  1. "What is the BEST phone number to call the day of the visit?" - include this in appointment notes  2. Do you have or have access to (through a family member/friend) a smartphone with video capability that we can use for your visit?" a. If yes - list this number in appt notes as cell (if different from BEST phone #) and list the appointment type as a VIDEO visit in appointment notes b. If no - list the appointment type as a PHONE visit in appointment notes  3. Confirm consent - "In the setting of the current Covid19 crisis, you are scheduled for a (phone or video) visit with your provider on (date) at (time).  Just as we do with many in-office visits, in order for you to participate in this visit, we must obtain consent.  If you'd like, I can send this to your mychart (if signed up) or email for you to review.  Otherwise, I can obtain your verbal consent now.  All virtual visits are billed to your insurance company just like a normal visit would be.  By agreeing to a virtual visit, we'd like you to understand that the technology does not allow for your provider to perform an examination, and thus may limit your provider's ability to fully assess your condition. If your provider identifies any concerns that need to be evaluated in person, we will make arrangements to do so.  Finally, though the technology is pretty good, we cannot assure that it will always work on either your or our end, and in the setting of a video visit, we may have to convert it to a phone-only visit.  In either situation, we cannot ensure that we have a secure connection.  Are you willing to proceed?" STAFF: Did the patient verbally acknowledge consent to telehealth visit? Document  YES/NO here: yes  4. Advise patient to be prepared - "Two hours prior to your appointment, go ahead and check your blood pressure, pulse, oxygen saturation, and your weight (if you have the equipment to check those) and write them all down. When your visit starts, your provider will ask you for this information. If you have an Apple Watch or Kardia device, please plan to have heart rate information ready on the day of your appointment. Please have a pen and paper handy nearby the day of the visit as well."  5. Give patient instructions for MyChart download to smartphone OR Doximity/Doxy.me as below if video visit (depending on what platform provider is using)  6. Inform patient they will receive a phone call 15 minutes prior to their appointment time (may be from unknown caller ID) so they should be prepared to answer    TELEPHONE CALL NOTE  Samuel Ryan has been deemed a candidate for a follow-up tele-health visit to limit community exposure during the Covid-19 pandemic. I spoke with the patient via phone to ensure availability of phone/video source, confirm preferred email & phone number, and discuss instructions and expectations.  I reminded Samuel Ryan to be prepared with any vital sign and/or heart rhythm information that could potentially be obtained via home monitoring, at the time of his visit. I reminded Samuel Ryan to expect a phone call prior to  his visit.  Weston Anna 05/15/2019 12:28 PM   INSTRUCTIONS FOR DOWNLOADING THE MYCHART APP TO SMARTPHONE  - The patient must first make sure to have activated MyChart and know their login information - If Apple, go to [employer] and type in MyChart in the search bar and download the app. If Android, ask patient to go to [employer] and type in [place] in the search bar and download the app. The app is free but as with any other app downloads, their phone may require them to verify saved payment information or Apple/Android  password.  - The patient will need to then log into the app with their MyChart username and password, and select [facility] as their healthcare provider to link the account. When it is time for your visit, go to the MyChart app, find appointments, and click Begin Video Visit. Be sure to Select Allow for your device to access the Microphone and Camera for your visit. You will then be connected, and your provider will be with you shortly.  **If they have any issues connecting, or need assistance please contact MyChart service desk [phone])**  **If using a computer, in order to ensure the best quality for their visit they will need to use either of the following Internet Browsers: [employer], or Google Chrome**  IF USING DOXIMITY or DOXY.ME - The patient will receive a link just prior to their visit by text.     FULL LENGTH CONSENT FOR TELE-HEALTH VISIT   I hereby voluntarily request, consent and authorize [facility] and its employed or contracted physicians, physician assistants, nurse practitioners or other licensed health care professionals (the Practitioner), to provide me with telemedicine health care services (the Services") as deemed necessary by the treating Practitioner. I acknowledge and consent to receive the Services by the Practitioner via telemedicine. I understand that the telemedicine visit will involve communicating with the Practitioner through live audiovisual communication technology and the disclosure of certain medical information by electronic transmission. I acknowledge that I have been given the opportunity to request an in-person assessment or other available alternative prior to the telemedicine visit and am voluntarily participating in the telemedicine visit.  I understand that I have the right to withhold or withdraw my consent to the use of telemedicine in the course of my care at any time, without affecting my right to future care or treatment,  and that the Practitioner or I may terminate the telemedicine visit at any time. I understand that I have the right to inspect all information obtained and/or recorded in the course of the telemedicine visit and may receive copies of available information for a reasonable fee.  I understand that some of the potential risks of receiving the Services via telemedicine include:   Delay or interruption in medical evaluation due to technological equipment failure or disruption;  Information transmitted may not be sufficient (e.g. poor resolution of images) to allow for appropriate medical decision making by the Practitioner; and/or   In rare instances, security protocols could fail, causing a breach of personal health information.  Furthermore, I acknowledge that it is my responsibility to provide information about my medical history, conditions and care that is complete and accurate to the best of my ability. I acknowledge that Practitioner's advice, recommendations, and/or decision may be based on factors not within their control, such as incomplete or inaccurate data provided by me or distortions of diagnostic images or specimens that may result from electronic transmissions. I  understand that the practice of medicine is not an exact science and that Practitioner makes no warranties or guarantees regarding treatment outcomes. I acknowledge that I will receive a copy of this consent concurrently upon execution via email to the email address I last provided but may also request a printed copy by calling the office of [facility].    I understand that my insurance will be billed for this visit.   I have read or had this consent read to me.  I understand the contents of this consent, which adequately explains the benefits and risks of the Services being provided via telemedicine.   I have been provided ample opportunity to ask questions regarding this consent and the Services and have had my questions  answered to my satisfaction.  I give my informed consent for the services to be provided through the use of telemedicine in my medical care  By participating in this telemedicine visit I agree to the above.

## 2019-05-17 ENCOUNTER — Other Ambulatory Visit: Payer: Self-pay | Admitting: Family Medicine

## 2019-05-17 ENCOUNTER — Telehealth: Payer: Self-pay | Admitting: *Deleted

## 2019-05-17 NOTE — Telephone Encounter (Signed)
Son aware.

## 2019-05-17 NOTE — Telephone Encounter (Signed)
If current;ly taking 10 units lantus, increase to 15 units once daily, if otherwise , needs to increase by 5 units daily, call back in 1 week, and please document dose in med list update

## 2019-05-17 NOTE — Telephone Encounter (Signed)
Blanch Media (caretaker) not on DPR called for Chase Caller (son) to let Dr. Moshe Cipro know Mr. Pender blood sugars. They are  6-11 fasting 219 after meal 278 6-12 fasting 166 after meal 246 6-13 fasting 285 after meal 243 6-14 fasting 165 after meal 259 6-15 fasting 181 after meal 340 6-16 fasting 190 aftermeal 181 6-17 fasting 229 aftermeal 246 6-18 fasting 132 They are doing 18 units

## 2019-05-18 ENCOUNTER — Telehealth (INDEPENDENT_AMBULATORY_CARE_PROVIDER_SITE_OTHER): Payer: Medicare HMO | Admitting: Cardiology

## 2019-05-18 ENCOUNTER — Encounter: Payer: Self-pay | Admitting: Cardiology

## 2019-05-18 VITALS — BP 137/75 | HR 91 | Temp 98.3°F | Ht 71.0 in

## 2019-05-18 DIAGNOSIS — I471 Supraventricular tachycardia: Secondary | ICD-10-CM | POA: Diagnosis not present

## 2019-05-18 DIAGNOSIS — Z7189 Other specified counseling: Secondary | ICD-10-CM

## 2019-05-18 DIAGNOSIS — I10 Essential (primary) hypertension: Secondary | ICD-10-CM

## 2019-05-18 DIAGNOSIS — E782 Mixed hyperlipidemia: Secondary | ICD-10-CM

## 2019-05-18 NOTE — Progress Notes (Signed)
Virtual Visit via Telephone Note   This visit type was conducted due to national recommendations for restrictions regarding the COVID-19 Pandemic (e.g. social distancing) in an effort to limit this patient's exposure and mitigate transmission in our community.  Due to his co-morbid illnesses, this patient is at least at moderate risk for complications without adequate follow up.  This format is felt to be most appropriate for this patient at this time.  The patient did not have access to video technology/had technical difficulties with video requiring transitioning to audio format only (telephone).  All issues noted in this document were discussed and addressed.  No physical exam could be performed with this format.  Please refer to the patient's chart for his  consent to telehealth for [facility].   Date:  05/18/2019   ID:  Samuel Ryan, DOB 06/17/1931, MRN [id number]  Patient Location: Home Provider Location: Office  PCP:  [name], MD  Cardiologist:  Rozann Lesches, MD Electrophysiologist:  None   Evaluation Performed:  Follow-Up Visit  Chief Complaint:   Cardiac follow-up  History of Present Illness:    Samuel Ryan is an 85 y.o. male last seen in December 2019.  I had difficulty establishing video access and we spoke by phone.  I spoke mainly to the patient's son, Samuel Ryan has fairly significant dementia.  He still has had somewhat of a limited appetite, also fluctuating blood pressures, and a recent course of antibiotics per PCP.  He will be seeing Dr. Moshe Cipro next week.  Patient's son indicates that Samuel Ryan seems to feel somewhat better after course of antibiotics, not sleeping as much.  At the last visit he was switched from Lopressor to atenolol.  Not clear that this made much of a change in the patient's appetite.  Atenolol dose was reduced to 25 mg daily.  Recent home blood pressures reported to be 133/85, 122/66, 135/92.  The patient does not have  symptoms concerning for COVID-19 infectio (fever, chills, cough, or new shortness of breath).    Past Medical History:  Diagnosis Date  . Acute cholecystitis 11/2012   Drained percutaneously  . Anxiety   . BPH (benign prostatic hypertrophy)   . Deep venous thrombosis (HCC)    Left leg, diagnosed 7/12  . Dementia [facility])    Needs supervision for safety per son - POA  . Diverticulitis   . Essential hypertension   . GERD (gastroesophageal reflux disease)   . History of pneumonia    Prior to 2012  . Incontinence of urine   . Mixed hyperlipidemia   . Osteoarthritis   . Pulmonary embolism ([place])    Diagnosed 7/12  . Type 2 diabetes mellitus ([place])    Past Surgical History:  Procedure Laterality Date  . CARPAL TUNNEL RELEASE    . COLONOSCOPY  08/31/2012   Procedure: COLONOSCOPY;  Surgeon: Rogene Houston, MD;  Location: [facility];  Service: Endoscopy;  Laterality: N/A;  830  . CYSTOSCOPY WITH INJECTION N/A 05/15/2018   Procedure: CYSTOSCOPY WITH BOTOX INJECTION;  Surgeon: Cleon Gustin, MD;  Location: [facility] ORS;  Service: Urology;  Laterality: N/A;  . CYSTOSCOPY WITH INJECTION N/A 11/10/2018   Procedure: CYSTOSCOPY WITH BOTOX  INJECTION;  Surgeon: Cleon Gustin, MD;  Location: [facility] ORS;  Service: Urology;  Laterality: N/A;  30 MINS  . CYSTOSCOPY WITH INSERTION OF UROLIFT N/A 09/02/2016   Procedure: CYSTOSCOPY WITH INSERTION OF UROLIFT;  Surgeon: Cleon Gustin, MD;  Location:  [facility];  Service: Urology;  Laterality: N/A;  . EYE SURGERY Bilateral    bilateral cataract  . Right total knee replacement  04/23/2011  . SPINE SURGERY  prior to 2012   Neck fusion  . UMBILICAL HERNIA REPAIR     umbilical     Current Meds  Medication Sig  . acetaminophen (TYLENOL) 500 MG tablet Take 1,000 mg by mouth every 6 (six) hours as needed for mild pain, moderate pain or headache.   [name] atenolol (TENORMIN) 25 MG tablet Take 25 mg by mouth daily.  . benzonatate (TESSALON)  100 MG capsule Take 1 capsule (100 mg total) by mouth 2 (two) times daily as needed for cough.  . Blood Glucose Monitoring Suppl (TRUE METRIX METER) w/Device KIT With supplies, alcohol swabs as covered by insurance  . clobetasol cream (TEMOVATE) 4.09 % APPLY 1 APPLICATION TOPICALLY 2 (TWO) TIMES DAILY.  [name] donepezil (ARICEPT) 10 MG tablet TAKE 1 TABLET AT BEDTIME  . Ferrous Sulfate (IRON) 325 (65 Fe) MG TABS Take 325 mg by mouth daily.   [name] glipiZIDE (GLUCOTROL XL) 10 MG 24 hr tablet Take two tablets every morning at breakfast by mouth  . Insulin Glargine (LANTUS SOLOSTAR) 100 UNIT/ML Solostar Pen Inject 23 Units into the skin every morning. HOLD for blood sugar levels below 100  . Insulin Pen Needle (PEN NEEDLES) 31G X 5 MM MISC 1 each by Does not apply route as directed. Use to inject lantus daily dx E11.65  . Insulin Pen Needle 29G X 5MM MISC 1 each by Does not apply route as directed.  . loratadine (CLARITIN) 10 MG tablet Take 10 mg by mouth every morning.   . lovastatin (MEVACOR) 40 MG tablet TAKE 1 TABLET AT BEDTIME  . omeprazole (PRILOSEC) 40 MG capsule TAKE 1 CAPSULE TWICE DAILY  . sertraline (ZOLOFT) 50 MG tablet Take 1 tablet (50 mg total) by mouth daily.  . tamsulosin (FLOMAX) 0.4 MG CAPS capsule TAKE 1 CAPSULE EVERY DAY  . traMADol (ULTRAM) 50 MG tablet Take 1 tablet (50 mg total) by mouth every 12 (twelve) hours as needed for moderate pain.  . Trospium Chloride 60 MG CP24 Take 60 mg by mouth daily.   . TRUE METRIX BLOOD GLUCOSE TEST test strip CHECK BLOOD SUGAR ONCE DAILY OR AS DIRECTED  . TRUEplus Lancets 33G MISC USE TO TEST TWO TIMES A DAY  . Vitamin D, Ergocalciferol, (DRISDOL) 1.25 MG (50000 UT) CAPS capsule Take 1 capsule (50,000 Units total) by mouth every 7 (seven) days.     Allergies:   Zosyn [piperacillin sod-tazobactam so], Ace inhibitors, and Vancomycin   Social History   Tobacco Use  . Smoking status: Never Smoker  . Smokeless tobacco: Never Used  Substance Use  Topics  . Alcohol use: No  . Drug use: No     Family Hx: The patient's family history includes Cancer in his brother and mother; Diabetes in his brother and sister; Hypertension in his sister; Seizures in his son.  ROS:   Please see the history of present illness. All other systems reviewed and are negative.   Prior CV studies:   The following studies were reviewed today:  Echocardiogram 09/22/2018: Study Conclusions  - Procedure narrative: Transthoracic echocardiography. Image quality was adequate. Intravenous contrast (Definity) was administered. - Left ventricle: The cavity size was normal. There was mild concentric hypertrophy. Systolic function was normal. The estimated ejection fraction was in the range of 60% to 65%. Wall motion was normal;  there were no regional wall motion abnormalities. Doppler parameters are consistent with abnormal left ventricular relaxation (grade 1 diastolic dysfunction). - Ventricular septum: Septal motion showed abnormal function and dyssynergy. These changes are consistent with intraventricular conduction delay.  Labs/Other Tests and Data Reviewed:    EKG:  An ECG dated 09/25/2018 was personally reviewed today and demonstrated:  Wide-complex tachycardia with right bundle branch block consistent with SVT.  Recent Labs: 09/22/2018: TSH 0.441 09/25/2018: Magnesium 2.2 11/21/2018: B Natriuretic Peptide 208.0 04/24/2019: ALT 12; BUN 17; Creat 1.61; Hemoglobin 11.7; Platelets 167; Potassium 4.5; Sodium 138   Recent Lipid Panel Lab Results  Component Value Date/Time   CHOL 130 04/24/2019 09:28 AM   TRIG 115 04/24/2019 09:28 AM   HDL 34 (L) 04/24/2019 09:28 AM   CHOLHDL 3.8 04/24/2019 09:28 AM   LDLCALC 76 04/24/2019 09:28 AM    Wt Readings from Last 3 Encounters:  05/10/19 256 lb (116.1 kg)  04/24/19 256 lb (116.1 kg)  01/15/19 256 lb 4.8 oz (116.3 kg)     Objective:    Vital Signs:  BP 137/75   Pulse 91    Temp 98.3 F (36.8 C)   Ht '5\' 11"'  (1.803 m)   BMI 35.70 kg/m    Patient only spoke to a very limited degree of the phone.  ASSESSMENT & PLAN:    1.  PSVT by history.  Continue atenolol at 25 mg daily.  No obvious recurrence or reported palpitations based on discussion today.  2.  Essential hypertension, recent systolics in the 166M to large degree.  No changes made in present regimen.  3.  Mixed hyperlipidemia, on Mevacor.  Last LDL 76.  COVID-19 Education: The signs and symptoms of COVID-19 were discussed with the patient and how to seek care for testing (follow up with PCP or arrange E-visit).  The importance of social distancing was discussed today.  Time:   Today, I have spent 8 minutes with the patient with telehealth technology discussing the above problems.     Medication Adjustments/Labs and Tests Ordered: Current medicines are reviewed at length with the patient today.  Concerns regarding medicines are outlined above.   Tests Ordered: No orders of the defined types were placed in this encounter.   Medication Changes: No orders of the defined types were placed in this encounter.   Follow Up:  In Person 6 months in the [place] office.  Signed, Rozann Lesches, MD  05/18/2019 10:28 AM    [place]

## 2019-05-18 NOTE — Patient Instructions (Addendum)

## 2019-05-21 ENCOUNTER — Encounter: Payer: Self-pay | Admitting: Family Medicine

## 2019-05-21 ENCOUNTER — Other Ambulatory Visit: Payer: Self-pay

## 2019-05-21 ENCOUNTER — Encounter (INDEPENDENT_AMBULATORY_CARE_PROVIDER_SITE_OTHER): Payer: Self-pay

## 2019-05-21 ENCOUNTER — Ambulatory Visit (INDEPENDENT_AMBULATORY_CARE_PROVIDER_SITE_OTHER): Payer: Medicare HMO | Admitting: Family Medicine

## 2019-05-21 ENCOUNTER — Ambulatory Visit: Payer: Medicare HMO | Admitting: Family Medicine

## 2019-05-21 VITALS — BP 120/64 | HR 100 | Resp 12 | Ht 70.0 in

## 2019-05-21 DIAGNOSIS — E1165 Type 2 diabetes mellitus with hyperglycemia: Secondary | ICD-10-CM

## 2019-05-21 DIAGNOSIS — I10 Essential (primary) hypertension: Secondary | ICD-10-CM | POA: Diagnosis not present

## 2019-05-21 DIAGNOSIS — IMO0002 Reserved for concepts with insufficient information to code with codable children: Secondary | ICD-10-CM

## 2019-05-21 DIAGNOSIS — Z794 Long term (current) use of insulin: Secondary | ICD-10-CM

## 2019-05-21 DIAGNOSIS — E1121 Type 2 diabetes mellitus with diabetic nephropathy: Secondary | ICD-10-CM | POA: Diagnosis not present

## 2019-05-21 DIAGNOSIS — F0390 Unspecified dementia without behavioral disturbance: Secondary | ICD-10-CM

## 2019-05-21 NOTE — Patient Instructions (Signed)
Change f/u to August 27 or after  Non fasting hBA1c, chem 7 and eGFr  8 /25  Start 25 units insulin today, call on Thursday or send message of blod sugar values please .Thanks for choosing [facility], we consider it a privelige to serve you.

## 2019-05-22 ENCOUNTER — Encounter: Payer: Self-pay | Admitting: Family Medicine

## 2019-05-22 ENCOUNTER — Other Ambulatory Visit: Payer: Self-pay

## 2019-05-22 ENCOUNTER — Emergency Department (HOSPITAL_COMMUNITY)
Admission: EM | Admit: 2019-05-22 | Discharge: 2019-05-22 | Disposition: A | Discharge status: Home / Self Care | Payer: Medicare HMO | Attending: Emergency Medicine | Admitting: Emergency Medicine

## 2019-05-22 ENCOUNTER — Encounter (HOSPITAL_COMMUNITY): Payer: Self-pay | Admitting: Emergency Medicine

## 2019-05-22 ENCOUNTER — Emergency Department (HOSPITAL_COMMUNITY): Payer: Medicare HMO

## 2019-05-22 DIAGNOSIS — Z79899 Other long term (current) drug therapy: Secondary | ICD-10-CM | POA: Diagnosis not present

## 2019-05-22 DIAGNOSIS — R410 Disorientation, unspecified: Secondary | ICD-10-CM | POA: Diagnosis not present

## 2019-05-22 DIAGNOSIS — G309 Alzheimer's disease, unspecified: Secondary | ICD-10-CM | POA: Insufficient documentation

## 2019-05-22 DIAGNOSIS — A419 Sepsis, unspecified organism: Secondary | ICD-10-CM

## 2019-05-22 DIAGNOSIS — E119 Type 2 diabetes mellitus without complications: Secondary | ICD-10-CM | POA: Insufficient documentation

## 2019-05-22 DIAGNOSIS — Z1159 Encounter for screening for other viral diseases: Secondary | ICD-10-CM | POA: Insufficient documentation

## 2019-05-22 DIAGNOSIS — R4182 Altered mental status, unspecified: Secondary | ICD-10-CM | POA: Diagnosis not present

## 2019-05-22 DIAGNOSIS — Z794 Long term (current) use of insulin: Secondary | ICD-10-CM | POA: Insufficient documentation

## 2019-05-22 DIAGNOSIS — F0390 Unspecified dementia without behavioral disturbance: Secondary | ICD-10-CM | POA: Insufficient documentation

## 2019-05-22 DIAGNOSIS — R531 Weakness: Secondary | ICD-10-CM | POA: Diagnosis not present

## 2019-05-22 DIAGNOSIS — R0902 Hypoxemia: Secondary | ICD-10-CM | POA: Diagnosis not present

## 2019-05-22 DIAGNOSIS — I4510 Unspecified right bundle-branch block: Secondary | ICD-10-CM | POA: Diagnosis not present

## 2019-05-22 DIAGNOSIS — Z20828 Contact with and (suspected) exposure to other viral communicable diseases: Secondary | ICD-10-CM | POA: Diagnosis not present

## 2019-05-22 DIAGNOSIS — I959 Hypotension, unspecified: Secondary | ICD-10-CM | POA: Diagnosis not present

## 2019-05-22 DIAGNOSIS — E1165 Type 2 diabetes mellitus with hyperglycemia: Secondary | ICD-10-CM | POA: Diagnosis not present

## 2019-05-22 LAB — COMPREHENSIVE METABOLIC PANEL
ALT: 16 U/L (ref 0–44)
AST: 28 U/L (ref 15–41)
Albumin: 2.9 g/dL — ABNORMAL LOW (ref 3.5–5.0)
Alkaline Phosphatase: 60 U/L (ref 38–126)
Anion gap: 9 (ref 5–15)
BUN: 23 mg/dL (ref 8–23)
CO2: 25 mmol/L (ref 22–32)
Calcium: 8.2 mg/dL — ABNORMAL LOW (ref 8.9–10.3)
Chloride: 100 mmol/L (ref 98–111)
Creatinine, Ser: 1.6 mg/dL — ABNORMAL HIGH (ref 0.61–1.24)
GFR calc Af Amer: 45 mL/min — ABNORMAL LOW (ref >60)
GFR calc non Af Amer: 38 mL/min — ABNORMAL LOW (ref >60)
Glucose, Bld: 287 mg/dL — ABNORMAL HIGH (ref 70–99)
Potassium: 4.5 mmol/L (ref 3.5–5.1)
Sodium: 134 mmol/L — ABNORMAL LOW (ref 135–145)
Total Bilirubin: 0.7 mg/dL (ref 0.3–1.2)
Total Protein: 6.7 g/dL (ref 6.5–8.1)

## 2019-05-22 LAB — URINALYSIS, ROUTINE W REFLEX MICROSCOPIC
Bacteria, UA: NONE SEEN
Bilirubin Urine: NEGATIVE
Glucose, UA: NEGATIVE mg/dL
Ketones, ur: NEGATIVE mg/dL
Leukocytes,Ua: NEGATIVE
Nitrite: NEGATIVE
Protein, ur: NEGATIVE mg/dL
Specific Gravity, Urine: 1.009 (ref 1.005–1.030)
pH: 6 (ref 5.0–8.0)

## 2019-05-22 LAB — CBC WITH DIFFERENTIAL/PLATELET
Abs Immature Granulocytes: 0.02 10*3/uL (ref 0.00–0.07)
Basophils Absolute: 0 10*3/uL (ref 0.0–0.1)
Basophils Relative: 0 %
Eosinophils Absolute: 0.1 10*3/uL (ref 0.0–0.5)
Eosinophils Relative: 1 %
HCT: 33.5 % — ABNORMAL LOW (ref 39.0–52.0)
Hemoglobin: 10.8 g/dL — ABNORMAL LOW (ref 13.0–17.0)
Immature Granulocytes: 0 %
Lymphocytes Relative: 31 %
Lymphs Abs: 1.5 10*3/uL (ref 0.7–4.0)
MCH: 31.7 pg (ref 26.0–34.0)
MCHC: 32.2 g/dL (ref 30.0–36.0)
MCV: 98.2 fL (ref 80.0–100.0)
Monocytes Absolute: 0.5 10*3/uL (ref 0.1–1.0)
Monocytes Relative: 9 %
Neutro Abs: 2.9 10*3/uL (ref 1.7–7.7)
Neutrophils Relative %: 59 %
Platelets: 150 10*3/uL (ref 150–400)
RBC: 3.41 MIL/uL — ABNORMAL LOW (ref 4.22–5.81)
RDW: 14.2 % (ref 11.5–15.5)
WBC: 5 10*3/uL (ref 4.0–10.5)
nRBC: 0 % (ref 0.0–0.2)

## 2019-05-22 LAB — SARS CORONAVIRUS 2 BY RT PCR (HOSPITAL ORDER, PERFORMED IN ~~LOC~~ HOSPITAL LAB): SARS Coronavirus 2: NEGATIVE

## 2019-05-22 LAB — LACTIC ACID, PLASMA: Lactic Acid, Venous: 1.1 mmol/L (ref 0.5–1.9)

## 2019-05-22 LAB — PROTIME-INR
INR: 1 (ref 0.8–1.2)
Prothrombin Time: 13.3 seconds (ref 11.4–15.2)

## 2019-05-22 LAB — CBG MONITORING, ED: Glucose-Capillary: 284 mg/dL — ABNORMAL HIGH (ref 70–99)

## 2019-05-22 NOTE — Assessment & Plan Note (Signed)
Controlled, no change in medication  

## 2019-05-22 NOTE — Assessment & Plan Note (Signed)
Pt somewhat agitated at visit, unclear if this is reflecting disease progression or because of irritation with dentures and wait time, will re assess. Of note , son has been reporting increased agitation repeatedly in past several weeks

## 2019-05-22 NOTE — Assessment & Plan Note (Signed)
Pt continues to have elevated blod sugars, and will f/u weekly with numbers to increase insulin dose appropriately Offered Endo referral however son not too keep at this time Son will likely benefit from diabetic ed, as carb load per meal may well be above ideal and thios will also help his son who is extremely attentive to manage the disease Increased insulin to 25 units

## 2019-05-22 NOTE — ED Triage Notes (Addendum)
Pt recently seen by PCP and told his blood glucose was elevated (300's) and had bronchitis and started on some antibiotic. Today, pt's son states pt was acting more altered and that his sugar was elevated. Per EMS, blood glucose was 287.

## 2019-05-22 NOTE — ED Notes (Signed)
Pt's son Chase Caller is in [place] parking lot and would like to have discharge instructions explained to him when the time comes.

## 2019-05-22 NOTE — Progress Notes (Signed)
   Samuel Ryan     MRN: [id number]      DOB: 1931-03-07   HPI Samuel Ryan is here with his son with c/o decreased appetite, fatigue and irritability. Son very concerned that blood sugar remains high, and he does state that recent antibiotic course did seem to help overall Blood sugars are done 2 to 3 times daily, and range between 200 and 300  ROS Denies recent fever or chills. Denies sinus pressure, nasal congestion, ear pain or sore throat. Denies chest congestion, productive cough or wheezing. Denies  leg swelling Denies abdominal pain, nausea, vomiting,diarrhea or constipation.    C/o chronic  joint pain, swelling and limitation in mobility. Denies skin break down or rash.   PE  BP 120/64   Pulse 100   Resp 12   Ht 5\' 10"  (1.778 m)   SpO2 91%   BMI 36.73 kg/m   Patient alert and oriented and in no cardiopulmonary distress.Somewhat agitated, c/o dentures being stuck in hios mouth and wants them taken out  HEENT: No facial asymmetry, EOMI,   oropharynx pink and moist.  Neck decreased ROM .  Chest: Clear to auscultation bilaterally.decreased though adequate in bases  CVS: S1, S2 no murmurs, no S3.Regular rate.  ABD: Soft non tender.   Ext: No edema  DS:[id number]  ROM spine, shoulders, hips and knees.  Skin: Intact, no ulcerations or rash noted.  Psych: Good eye contact, . Memory loss  anxious appearing.  CNS: CN 2-12 intact, power,  normal throughout.   Assessment & Plan  Uncontrolled type 2 diabetes mellitus with hyperglycemia (HCC) Pt continues to have elevated blod sugars, and will f/u weekly with numbers to increase insulin dose appropriately Offered Endo referral however son not too keep at this time Son will likely benefit from diabetic ed, as carb load per meal may well be above ideal and thios will also help his son who is extremely attentive to manage the disease Increased insulin to 25 units  Labile hypertension Controlled, no change in medication    Essential hypertension Controlled, no change in medication   Dementia without behavioral disturbance (HCC) Pt somewhat agitated at visit, unclear if this is reflecting disease progression or because of irritation with dentures and wait time, will re assess. Of note , son has been reporting increased agitation repeatedly in past several weeks

## 2019-05-22 NOTE — ED Notes (Signed)
Spoke with pt's son Chase Caller and went over discharge instructions with him.

## 2019-05-22 NOTE — ED Provider Notes (Signed)
[facility] EMERGENCY DEPARTMENT Provider Note   CSN: [id number] Arrival date & time: 05/22/19  0058     History   Chief Complaint Chief Complaint  Patient presents with  . Hyperglycemia    HPI Samuel Ryan is a 85 y.o. male.     Patient presents to the emergency department for evaluation of generalized weakness.  Family report that the patient seemed to be confused and very weak today, more so than usual.  Patient was recently seen by primary care doctor for cough and diagnosed with sinusitis, prescribed a week of doxycycline which he just finished.  During that visit his blood sugar was elevated.  Patient denies feeling short of breath.  He is not experiencing any chest pain.  He denies abdominal pain, nausea, vomiting, diarrhea.  He has not noticed any urinary symptoms including dysuria or frequency.     Past Medical History:  Diagnosis Date  . Acute cholecystitis 11/2012   Drained percutaneously  . Anxiety   . BPH (benign prostatic hypertrophy)   . Deep venous thrombosis (HCC)    Left leg, diagnosed 7/12  . Dementia [facility])    Needs supervision for safety per son - POA  . Diverticulitis   . Essential hypertension   . GERD (gastroesophageal reflux disease)   . History of pneumonia    Prior to 2012  . Incontinence of urine   . Mixed hyperlipidemia   . Osteoarthritis   . Pulmonary embolism ([place])    Diagnosed 7/12  . Type 2 diabetes mellitus [facility])     Patient Active Problem List   Diagnosis Date Noted  . Sinusitis 05/11/2019  . Labile hypertension 04/29/2019  . Bilateral hand pain 11/28/2018  . Knee pain, left 10/10/2018  . History of total knee arthroplasty, right 10/10/2018  . Unilateral primary osteoarthritis, left knee 10/10/2018  . Paroxysmal SVT (supraventricular tachycardia) ([place]) 09/25/2018  . Uncontrolled type 2 diabetes mellitus with hyperglycemia ([place]) 09/22/2018  . Dementia without behavioral disturbance ([place]) 09/22/2018  . Fall 08/10/2018  .  Morbid obesity ([place]) 06/12/2018  . Anemia 09/14/2017  . CKD (chronic kidney disease) stage 3, GFR 30-59 ml/min (HCC) 09/14/2017  . Atrial fibrillation ([place]) 09/14/2017  . Hypoalbuminemia 11/23/2016  . Hypoxia 10/29/2016  . Urinary frequency 09/17/2015  . At high risk for falls 03/30/2015  . Generalized osteoarthritis 07/16/2014  . Urinary incontinence 07/16/2014  . Pulmonary interstitial fibrosis ([place]) 07/03/2014  . Abnormal CXR (chest x-ray) 05/27/2014  . Thyroid mass of unclear etiology 05/07/2014  . Vitamin D deficiency 01/31/2014  . Allergic rhinitis 01/31/2014  . Diverticulosis of sigmoid colon 12/13/2012  . Right ventricular dysfunction 07/20/2011  . Heart disease, unspecified 07/20/2011  . Alzheimer's dementia without behavioral disturbance ([place]) 09/12/2010  . Goiter 06/19/2009  . Essential hypertension 12/11/2008  . Uncontrolled diabetes mellitus with diabetic nephropathy, with long-term current use of insulin ([place]) 09/05/2008  . Hyperlipidemia with target LDL less than 100 07/19/2008  . BPH (benign prostatic hyperplasia) 03/28/2008    Past Surgical History:  Procedure Laterality Date  . CARPAL TUNNEL RELEASE    . COLONOSCOPY  08/31/2012   Procedure: COLONOSCOPY;  Surgeon: Rogene Houston, MD;  Location: [facility] ENDO SUITE;  Service: Endoscopy;  Laterality: N/A;  830  . CYSTOSCOPY WITH INJECTION N/A 05/15/2018   Procedure: CYSTOSCOPY WITH BOTOX INJECTION;  Surgeon: Cleon Gustin, MD;  Location: [facility] ORS;  Service: Urology;  Laterality: N/A;  . CYSTOSCOPY WITH INJECTION N/A 11/10/2018   Procedure: CYSTOSCOPY WITH BOTOX  INJECTION;  Surgeon: Cleon Gustin, MD;  Location: [facility] ORS;  Service: Urology;  Laterality: N/A;  30 MINS  . CYSTOSCOPY WITH INSERTION OF UROLIFT N/A 09/02/2016   Procedure: CYSTOSCOPY WITH INSERTION OF UROLIFT;  Surgeon: Cleon Gustin, MD;  Location: [facility];  Service: Urology;  Laterality: N/A;  . EYE SURGERY Bilateral     bilateral cataract  . Right total knee replacement  04/23/2011  . SPINE SURGERY  prior to 2012   Neck fusion  . UMBILICAL HERNIA REPAIR     umbilical        Home Medications    Prior to Admission medications   Medication Sig Start Date End Date Taking? Authorizing Provider  acetaminophen (TYLENOL) 500 MG tablet Take 1,000 mg by mouth every 6 (six) hours as needed for mild pain, moderate pain or headache.     [provider]  atenolol (TENORMIN) 25 MG tablet Take 25 mg by mouth daily.    [provider]  benzonatate (TESSALON) 100 MG capsule Take 1 capsule (100 mg total) by mouth 2 (two) times daily as needed for cough. 04/24/19   [name], MD  Blood Glucose Monitoring Suppl (TRUE METRIX METER) w/Device KIT With supplies, alcohol swabs as covered by insurance 09/22/18   [name], MD  clobetasol cream (TEMOVATE) 8.12 % APPLY 1 APPLICATION TOPICALLY 2 (TWO) TIMES DAILY. 04/13/19   [name], MD  donepezil (ARICEPT) 10 MG tablet TAKE 1 TABLET AT BEDTIME 05/17/19   [name], MD  Ferrous Sulfate (IRON) 325 (65 Fe) MG TABS Take 325 mg by mouth daily.     [provider]  furosemide (LASIX) 20 MG tablet Take 1 tablet by mouth daily. 02/24/19   [provider]  glipiZIDE (GLUCOTROL XL) 10 MG 24 hr tablet Take two tablets every morning at breakfast by mouth 04/26/19   [name], MD  Insulin Glargine (LANTUS SOLOSTAR) 100 UNIT/ML Solostar Pen Inject 23 Units into the skin every morning. HOLD for blood sugar levels below 100 06/06/18   [name], MD  Insulin Pen Needle (PEN NEEDLES) 31G X 5 MM MISC 1 each by Does not apply route as directed. Use to inject lantus daily dx E11.65 02/14/18   [name], MD  Insulin Pen Needle 29G X 5MM MISC 1 each by Does not apply route as directed. 09/22/18   [name], MD  loratadine (CLARITIN) 10 MG tablet Take 10 mg by mouth every morning.     [provider]  lovastatin (MEVACOR) 40 MG tablet TAKE 1 TABLET AT BEDTIME 02/26/19   [name], MD  omeprazole (PRILOSEC) 40 MG capsule TAKE 1 CAPSULE TWICE DAILY 04/09/19   [name], MD  sertraline (ZOLOFT) 50 MG tablet Take 1 tablet (50 mg total) by mouth daily. 01/22/19   [name], MD  tamsulosin (FLOMAX) 0.4 MG CAPS capsule TAKE 1 CAPSULE EVERY DAY 04/02/19   [name], MD  traMADol (ULTRAM) 50 MG tablet Take 1 tablet (50 mg total) by mouth every 12 (twelve) hours as needed for moderate pain. 10/03/18   Virgie Dad, MD  Trospium Chloride 60 MG CP24 Take 60 mg by mouth daily.  06/02/17   [provider]  TRUE METRIX BLOOD GLUCOSE TEST test strip CHECK BLOOD SUGAR ONCE DAILY OR AS DIRECTED 04/12/19   [name], MD  TRUEplus Lancets 33G MISC USE TO TEST TWO TIMES A  DAY 05/09/19   [name], MD  Vitamin D, Ergocalciferol, (DRISDOL) 1.25 MG (50000 UT) CAPS capsule Take 1 capsule (50,000 Units total) by mouth every 7 (seven) days. 03/08/19   Perlie Mayo, NP    Family History Family History  Problem Relation Age of Onset  . Cancer Mother        Stomach  . Diabetes Sister   . Hypertension Sister   . Diabetes Brother   . Cancer Brother        Gallbladder  . Seizures Son        due to meningitis as a child    Social History Social History   Tobacco Use  . Smoking status: Never Smoker  . Smokeless tobacco: Never Used  Substance Use Topics  . Alcohol use: No  . Drug use: No     Allergies   Zosyn [piperacillin sod-tazobactam so], Ace inhibitors, and Vancomycin   Review of Systems Review of Systems  Constitutional: Positive for fatigue.  Respiratory: Positive for cough.   All other systems reviewed and are negative.    Physical Exam Updated Vital Signs BP (!) 105/56 (BP Location: Right Arm)   Pulse 96   Temp 98 F (36.7 C) (Oral)   Resp 16   Wt 116 kg   SpO2 98%   BMI 36.69 kg/m   Physical Exam  Vitals signs and nursing note reviewed.  Constitutional:      General: He is not in acute distress.    Appearance: Normal appearance. He is well-developed.  HENT:     Head: Normocephalic and atraumatic.     Right Ear: Hearing normal.     Left Ear: Hearing normal.     Nose: Nose normal.  Eyes:     Conjunctiva/sclera: Conjunctivae normal.     Pupils: Pupils are equal, round, and reactive to light.  Neck:     Musculoskeletal: Normal range of motion and neck supple.  Cardiovascular:     Rate and Rhythm: Regular rhythm.     Heart sounds: S1 normal and S2 normal. No murmur. No friction rub. No gallop.   Pulmonary:     Effort: Pulmonary effort is normal. No respiratory distress.     Breath sounds: Normal breath sounds.  Chest:     Chest wall: No tenderness.  Abdominal:     General: Bowel sounds are normal.     Palpations: Abdomen is soft.     Tenderness: There is no abdominal tenderness. There is no guarding or rebound. Negative signs include Murphy's sign and McBurney's sign.     Hernia: No hernia is present.  Musculoskeletal: Normal range of motion.  Skin:    General: Skin is warm and dry.     Findings: No rash.  Neurological:     Mental Status: He is alert and oriented to person, place, and time.     GCS: GCS eye subscore is 4. GCS verbal subscore is 5. GCS motor subscore is 6.     Cranial Nerves: No cranial nerve deficit.     Sensory: No sensory deficit.     Coordination: Coordination normal.  Psychiatric:        Speech: Speech normal.        Behavior: Behavior normal.        Thought Content: Thought content normal.      ED Treatments / Results  Labs (all labs ordered are listed, but only abnormal results are displayed) Labs Reviewed  CBG MONITORING, ED - Abnormal;  Notable for the following components:      Result Value   Glucose-Capillary 284 (*)    All other components within normal limits  CULTURE, BLOOD (ROUTINE X 2)  CULTURE, BLOOD (ROUTINE X 2)  URINALYSIS,  ROUTINE W REFLEX MICROSCOPIC  COMPREHENSIVE METABOLIC PANEL  LACTIC ACID, PLASMA  LACTIC ACID, PLASMA  CBC WITH DIFFERENTIAL/PLATELET  PROTIME-INR    EKG    Radiology No results found.  Procedures Procedures (including critical care time)  Medications Ordered in ED Medications - No data to display   Initial Impression / Assessment and Plan / ED Course  I have reviewed the triage vital signs and the nursing notes.  Pertinent labs & imaging results that were available during my care of the patient were reviewed by me and considered in my medical decision making (see chart for details).        Patient presents to the emergency department for evaluation.  Patient son became concerned tonight because the patient seemed more confused than usual.  Patient was reportedly getting ready to take a bath and was confused as to how to turn the hot water on.  This is unusual for him.  Son also reports that over the last week he has had difficulty working the remote in the house and doing other simple tasks that he normally could do.  Patient is awake alert here in the ER.  He is in no distress.  He has no complaints.  His work-up has been very reassuring.  No sign of infection.  All lab work was unremarkable.  Urinalysis did not show infection.  Chest x-ray showed bilateral opacities that could be atelectasis or infiltrate.  As he is no longer having any cough or signs of URI, doubt that this represents a new pneumonia.  Just finished a course of doxycycline which should cover for most lung pathology.  Patient's oxygen saturations have been 95 to 96% on room air here in the ER, no respiratory distress or pulmonary symptoms.  Lungs are clear on auscultation.  Patient will therefore be appropriate for discharge and can follow with primary care as needed.  Findings were discussed at length with the patient's son over the telephone.  He understands that the patient come back anytime if he has  worsening symptoms.  Final Clinical Impressions(s) / ED Diagnoses   Final diagnoses:  Confusion    ED Discharge Orders    None       Orpah Greek, MD 05/22/19 [phone]

## 2019-05-27 LAB — CULTURE, BLOOD (ROUTINE X 2)
Culture: NO GROWTH
Culture: NO GROWTH
Special Requests: ADEQUATE
Special Requests: ADEQUATE

## 2019-05-28 ENCOUNTER — Other Ambulatory Visit: Payer: Self-pay | Admitting: Family Medicine

## 2019-05-28 ENCOUNTER — Telehealth: Payer: Self-pay | Admitting: Family Medicine

## 2019-05-28 ENCOUNTER — Other Ambulatory Visit: Payer: Self-pay

## 2019-05-28 MED ORDER — TRUE METRIX BLOOD GLUCOSE TEST VI STRP: ORAL_STRIP | 0 refills | Status: DC

## 2019-05-28 MED ORDER — ALCOHOL PREP PADS: MEDICATED_PAD | 1 refills | Status: DC

## 2019-05-28 MED ORDER — TRUEPLUS LANCETS 33G MISC: 1 refills | Status: DC

## 2019-05-28 NOTE — Telephone Encounter (Signed)
Supplies refilled.

## 2019-05-28 NOTE — Telephone Encounter (Signed)
pls order 4 times daily test supplies for pt, dx is uncontrolled iDDM, son requests increased testing, states needs supplies

## 2019-05-28 NOTE — Telephone Encounter (Signed)
WILL SEND IN FOR TESTING SUPPLIES. SEE PREVIOUS MESSAGE

## 2019-05-28 NOTE — Telephone Encounter (Signed)
Pharmacy needs a new RX for Blood sugar testing twice a day

## 2019-05-29 ENCOUNTER — Ambulatory Visit: Payer: Medicare HMO | Admitting: Family Medicine

## 2019-05-31 ENCOUNTER — Other Ambulatory Visit: Payer: Self-pay

## 2019-05-31 MED ORDER — LANTUS SOLOSTAR 100 UNIT/ML ~~LOC~~ SOPN: 23.0000 [IU] | PEN_INJECTOR | Freq: Every morning | SUBCUTANEOUS | 5 refills | Status: DC

## 2019-06-02 DIAGNOSIS — R0902 Hypoxemia: Secondary | ICD-10-CM | POA: Diagnosis not present

## 2019-06-02 DIAGNOSIS — F329 Major depressive disorder, single episode, unspecified: Secondary | ICD-10-CM | POA: Diagnosis not present

## 2019-06-02 DIAGNOSIS — G308 Other Alzheimer's disease: Secondary | ICD-10-CM | POA: Diagnosis not present

## 2019-06-02 DIAGNOSIS — E1129 Type 2 diabetes mellitus with other diabetic kidney complication: Secondary | ICD-10-CM | POA: Diagnosis not present

## 2019-06-08 ENCOUNTER — Encounter: Payer: Self-pay | Admitting: Family Medicine

## 2019-06-15 ENCOUNTER — Other Ambulatory Visit: Payer: Self-pay | Admitting: Family Medicine

## 2019-06-25 ENCOUNTER — Encounter: Payer: Self-pay | Admitting: Family Medicine

## 2019-06-26 ENCOUNTER — Telehealth: Payer: Self-pay | Admitting: Family Medicine

## 2019-06-26 NOTE — Telephone Encounter (Signed)
pls refer for in home PT twice weekly for 6 weeks, dx arthritis and unsteady gait, high fall risk, thanks, I will sign

## 2019-06-27 ENCOUNTER — Other Ambulatory Visit: Payer: Self-pay | Admitting: Family Medicine

## 2019-06-28 ENCOUNTER — Telehealth: Payer: Self-pay

## 2019-06-28 DIAGNOSIS — Z9181 History of falling: Secondary | ICD-10-CM

## 2019-06-28 DIAGNOSIS — R2681 Unsteadiness on feet: Secondary | ICD-10-CM

## 2019-06-28 DIAGNOSIS — M1990 Unspecified osteoarthritis, unspecified site: Secondary | ICD-10-CM

## 2019-06-28 NOTE — Telephone Encounter (Signed)
Ambulatory ref to home health entered per MD

## 2019-06-28 NOTE — Telephone Encounter (Signed)
Referral has been entered.

## 2019-06-29 ENCOUNTER — Ambulatory Visit (INDEPENDENT_AMBULATORY_CARE_PROVIDER_SITE_OTHER): Payer: Medicare HMO | Admitting: Urology

## 2019-06-29 ENCOUNTER — Other Ambulatory Visit (HOSPITAL_COMMUNITY)
Admission: RE | Admit: 2019-06-29 | Discharge: 2019-06-29 | Disposition: A | Discharge status: Home / Self Care | Payer: Medicare HMO | Source: Other Acute Inpatient Hospital | Attending: Urology | Admitting: Urology

## 2019-06-29 ENCOUNTER — Other Ambulatory Visit: Payer: Self-pay

## 2019-06-29 ENCOUNTER — Other Ambulatory Visit: Payer: Self-pay | Admitting: Family Medicine

## 2019-06-29 DIAGNOSIS — R300 Dysuria: Secondary | ICD-10-CM

## 2019-06-29 DIAGNOSIS — N3941 Urge incontinence: Secondary | ICD-10-CM

## 2019-06-29 DIAGNOSIS — R3915 Urgency of urination: Secondary | ICD-10-CM

## 2019-06-30 LAB — URINE CULTURE: Culture: NO GROWTH

## 2019-07-02 ENCOUNTER — Ambulatory Visit (INDEPENDENT_AMBULATORY_CARE_PROVIDER_SITE_OTHER): Payer: Medicare HMO | Admitting: Family Medicine

## 2019-07-02 ENCOUNTER — Telehealth: Payer: Self-pay

## 2019-07-02 ENCOUNTER — Encounter: Payer: Self-pay | Admitting: Family Medicine

## 2019-07-02 ENCOUNTER — Other Ambulatory Visit: Payer: Self-pay

## 2019-07-02 VITALS — BP 112/78 | HR 110 | Temp 98.1°F | Resp 16 | Ht 71.0 in

## 2019-07-02 DIAGNOSIS — F0390 Unspecified dementia without behavioral disturbance: Secondary | ICD-10-CM

## 2019-07-02 DIAGNOSIS — E1165 Type 2 diabetes mellitus with hyperglycemia: Secondary | ICD-10-CM | POA: Diagnosis not present

## 2019-07-02 DIAGNOSIS — Z794 Long term (current) use of insulin: Secondary | ICD-10-CM | POA: Diagnosis not present

## 2019-07-02 DIAGNOSIS — IMO0002 Reserved for concepts with insufficient information to code with codable children: Secondary | ICD-10-CM

## 2019-07-02 DIAGNOSIS — Z9181 History of falling: Secondary | ICD-10-CM

## 2019-07-02 DIAGNOSIS — I10 Essential (primary) hypertension: Secondary | ICD-10-CM | POA: Diagnosis not present

## 2019-07-02 DIAGNOSIS — E559 Vitamin D deficiency, unspecified: Secondary | ICD-10-CM

## 2019-07-02 DIAGNOSIS — E1121 Type 2 diabetes mellitus with diabetic nephropathy: Secondary | ICD-10-CM

## 2019-07-02 LAB — GLUCOSE, POCT (MANUAL RESULT ENTRY): POC Glucose: 188 mg/dl — AB (ref 70–99)

## 2019-07-02 MED ORDER — TRAMADOL HCL 50 MG PO TABS: ORAL_TABLET | ORAL | 5 refills | Status: DC

## 2019-07-02 MED ORDER — VITAMIN D (ERGOCALCIFEROL) 1.25 MG (50000 UNIT) PO CAPS: 50000.0000 [IU] | ORAL_CAPSULE | ORAL | 5 refills | Status: DC

## 2019-07-02 MED ORDER — GLIPIZIDE ER 10 MG PO TB24: 10.0000 mg | ORAL_TABLET | Freq: Every day | ORAL | 5 refills | Status: DC

## 2019-07-02 NOTE — Telephone Encounter (Signed)
Son forgot to ask for tramadol refill at appt. Ok to refill?

## 2019-07-02 NOTE — Patient Instructions (Signed)
F/U as before, and please get labs 3 to 5 days before appt  Decrease to glipizide 10 mg ONE every morning , continue same 25 units lantus, want blood sugar between 100 and 200, over a 24 hour period  We will follow up with PT referral to agency which [place] your [place]

## 2019-07-02 NOTE — Telephone Encounter (Signed)
Med is prescribed 

## 2019-07-03 DIAGNOSIS — G308 Other Alzheimer's disease: Secondary | ICD-10-CM | POA: Diagnosis not present

## 2019-07-03 DIAGNOSIS — R0902 Hypoxemia: Secondary | ICD-10-CM | POA: Diagnosis not present

## 2019-07-03 DIAGNOSIS — F329 Major depressive disorder, single episode, unspecified: Secondary | ICD-10-CM | POA: Diagnosis not present

## 2019-07-03 DIAGNOSIS — E1129 Type 2 diabetes mellitus with other diabetic kidney complication: Secondary | ICD-10-CM | POA: Diagnosis not present

## 2019-07-08 ENCOUNTER — Other Ambulatory Visit: Payer: Self-pay

## 2019-07-08 ENCOUNTER — Inpatient Hospital Stay (HOSPITAL_COMMUNITY)
Admission: EM | Admit: 2019-07-08 | Discharge: 2019-07-13 | DRG: 093 | Disposition: A | Discharge status: Skilled Nursing Facility | Payer: Medicare HMO | Attending: Internal Medicine | Admitting: Internal Medicine

## 2019-07-08 ENCOUNTER — Emergency Department (HOSPITAL_COMMUNITY): Payer: Medicare HMO

## 2019-07-08 ENCOUNTER — Encounter (HOSPITAL_COMMUNITY): Payer: Self-pay | Admitting: Emergency Medicine

## 2019-07-08 DIAGNOSIS — Z79899 Other long term (current) drug therapy: Secondary | ICD-10-CM

## 2019-07-08 DIAGNOSIS — Y92002 Bathroom of unspecified non-institutional (private) residence single-family (private) house as the place of occurrence of the external cause: Secondary | ICD-10-CM | POA: Diagnosis not present

## 2019-07-08 DIAGNOSIS — F0390 Unspecified dementia without behavioral disturbance: Secondary | ICD-10-CM | POA: Diagnosis present

## 2019-07-08 DIAGNOSIS — N183 Chronic kidney disease, stage 3 (moderate): Secondary | ICD-10-CM | POA: Diagnosis present

## 2019-07-08 DIAGNOSIS — R05 Cough: Secondary | ICD-10-CM | POA: Diagnosis not present

## 2019-07-08 DIAGNOSIS — S0081XA Abrasion of other part of head, initial encounter: Secondary | ICD-10-CM | POA: Diagnosis present

## 2019-07-08 DIAGNOSIS — Z8249 Family history of ischemic heart disease and other diseases of the circulatory system: Secondary | ICD-10-CM

## 2019-07-08 DIAGNOSIS — Z794 Long term (current) use of insulin: Secondary | ICD-10-CM | POA: Diagnosis not present

## 2019-07-08 DIAGNOSIS — Z833 Family history of diabetes mellitus: Secondary | ICD-10-CM | POA: Diagnosis not present

## 2019-07-08 DIAGNOSIS — R55 Syncope and collapse: Secondary | ICD-10-CM | POA: Diagnosis present

## 2019-07-08 DIAGNOSIS — R29810 Facial weakness: Secondary | ICD-10-CM | POA: Diagnosis present

## 2019-07-08 DIAGNOSIS — Z96651 Presence of right artificial knee joint: Secondary | ICD-10-CM | POA: Diagnosis present

## 2019-07-08 DIAGNOSIS — J90 Pleural effusion, not elsewhere classified: Secondary | ICD-10-CM | POA: Diagnosis not present

## 2019-07-08 DIAGNOSIS — R262 Difficulty in walking, not elsewhere classified: Secondary | ICD-10-CM | POA: Diagnosis not present

## 2019-07-08 DIAGNOSIS — S79911A Unspecified injury of right hip, initial encounter: Secondary | ICD-10-CM | POA: Diagnosis not present

## 2019-07-08 DIAGNOSIS — W19XXXS Unspecified fall, sequela: Secondary | ICD-10-CM

## 2019-07-08 DIAGNOSIS — N400 Enlarged prostate without lower urinary tract symptoms: Secondary | ICD-10-CM | POA: Diagnosis present

## 2019-07-08 DIAGNOSIS — R52 Pain, unspecified: Secondary | ICD-10-CM | POA: Diagnosis not present

## 2019-07-08 DIAGNOSIS — F419 Anxiety disorder, unspecified: Secondary | ICD-10-CM | POA: Diagnosis present

## 2019-07-08 DIAGNOSIS — Z888 Allergy status to other drugs, medicaments and biological substances status: Secondary | ICD-10-CM | POA: Diagnosis not present

## 2019-07-08 DIAGNOSIS — R0902 Hypoxemia: Secondary | ICD-10-CM

## 2019-07-08 DIAGNOSIS — W1839XA Other fall on same level, initial encounter: Secondary | ICD-10-CM | POA: Diagnosis present

## 2019-07-08 DIAGNOSIS — M25551 Pain in right hip: Secondary | ICD-10-CM | POA: Diagnosis not present

## 2019-07-08 DIAGNOSIS — Z03818 Encounter for observation for suspected exposure to other biological agents ruled out: Secondary | ICD-10-CM | POA: Diagnosis not present

## 2019-07-08 DIAGNOSIS — I129 Hypertensive chronic kidney disease with stage 1 through stage 4 chronic kidney disease, or unspecified chronic kidney disease: Secondary | ICD-10-CM | POA: Diagnosis present

## 2019-07-08 DIAGNOSIS — R1310 Dysphagia, unspecified: Secondary | ICD-10-CM | POA: Diagnosis not present

## 2019-07-08 DIAGNOSIS — I480 Paroxysmal atrial fibrillation: Secondary | ICD-10-CM | POA: Diagnosis present

## 2019-07-08 DIAGNOSIS — R296 Repeated falls: Principal | ICD-10-CM | POA: Diagnosis present

## 2019-07-08 DIAGNOSIS — J8410 Pulmonary fibrosis, unspecified: Secondary | ICD-10-CM | POA: Diagnosis not present

## 2019-07-08 DIAGNOSIS — W19XXXA Unspecified fall, initial encounter: Secondary | ICD-10-CM | POA: Diagnosis not present

## 2019-07-08 DIAGNOSIS — Z86711 Personal history of pulmonary embolism: Secondary | ICD-10-CM

## 2019-07-08 DIAGNOSIS — Z20828 Contact with and (suspected) exposure to other viral communicable diseases: Secondary | ICD-10-CM | POA: Diagnosis present

## 2019-07-08 DIAGNOSIS — W19XXXD Unspecified fall, subsequent encounter: Secondary | ICD-10-CM | POA: Diagnosis not present

## 2019-07-08 DIAGNOSIS — S299XXA Unspecified injury of thorax, initial encounter: Secondary | ICD-10-CM | POA: Diagnosis not present

## 2019-07-08 DIAGNOSIS — R2689 Other abnormalities of gait and mobility: Secondary | ICD-10-CM | POA: Diagnosis not present

## 2019-07-08 DIAGNOSIS — N390 Urinary tract infection, site not specified: Secondary | ICD-10-CM | POA: Diagnosis not present

## 2019-07-08 DIAGNOSIS — E1122 Type 2 diabetes mellitus with diabetic chronic kidney disease: Secondary | ICD-10-CM | POA: Diagnosis present

## 2019-07-08 DIAGNOSIS — Z9181 History of falling: Secondary | ICD-10-CM

## 2019-07-08 DIAGNOSIS — K219 Gastro-esophageal reflux disease without esophagitis: Secondary | ICD-10-CM | POA: Diagnosis present

## 2019-07-08 DIAGNOSIS — E860 Dehydration: Secondary | ICD-10-CM | POA: Diagnosis present

## 2019-07-08 DIAGNOSIS — S79912A Unspecified injury of left hip, initial encounter: Secondary | ICD-10-CM | POA: Diagnosis not present

## 2019-07-08 DIAGNOSIS — I482 Chronic atrial fibrillation, unspecified: Secondary | ICD-10-CM | POA: Diagnosis not present

## 2019-07-08 DIAGNOSIS — R569 Unspecified convulsions: Secondary | ICD-10-CM | POA: Diagnosis not present

## 2019-07-08 DIAGNOSIS — E785 Hyperlipidemia, unspecified: Secondary | ICD-10-CM | POA: Diagnosis not present

## 2019-07-08 DIAGNOSIS — Z209 Contact with and (suspected) exposure to unspecified communicable disease: Secondary | ICD-10-CM | POA: Diagnosis not present

## 2019-07-08 DIAGNOSIS — M6281 Muscle weakness (generalized): Secondary | ICD-10-CM | POA: Diagnosis not present

## 2019-07-08 DIAGNOSIS — S3992XA Unspecified injury of lower back, initial encounter: Secondary | ICD-10-CM | POA: Diagnosis not present

## 2019-07-08 DIAGNOSIS — M545 Low back pain: Secondary | ICD-10-CM | POA: Diagnosis not present

## 2019-07-08 DIAGNOSIS — I10 Essential (primary) hypertension: Secondary | ICD-10-CM | POA: Diagnosis not present

## 2019-07-08 DIAGNOSIS — S0990XA Unspecified injury of head, initial encounter: Secondary | ICD-10-CM | POA: Diagnosis not present

## 2019-07-08 DIAGNOSIS — E782 Mixed hyperlipidemia: Secondary | ICD-10-CM | POA: Diagnosis present

## 2019-07-08 DIAGNOSIS — M25552 Pain in left hip: Secondary | ICD-10-CM | POA: Diagnosis not present

## 2019-07-08 DIAGNOSIS — R531 Weakness: Secondary | ICD-10-CM | POA: Diagnosis not present

## 2019-07-08 DIAGNOSIS — S199XXA Unspecified injury of neck, initial encounter: Secondary | ICD-10-CM | POA: Diagnosis not present

## 2019-07-08 DIAGNOSIS — R4182 Altered mental status, unspecified: Secondary | ICD-10-CM | POA: Diagnosis not present

## 2019-07-08 LAB — CBC WITH DIFFERENTIAL/PLATELET
Abs Immature Granulocytes: 0.01 10*3/uL (ref 0.00–0.07)
Basophils Absolute: 0 10*3/uL (ref 0.0–0.1)
Basophils Relative: 0 %
Eosinophils Absolute: 0.1 10*3/uL (ref 0.0–0.5)
Eosinophils Relative: 1 %
HCT: 35.8 % — ABNORMAL LOW (ref 39.0–52.0)
Hemoglobin: 11.3 g/dL — ABNORMAL LOW (ref 13.0–17.0)
Immature Granulocytes: 0 %
Lymphocytes Relative: 28 %
Lymphs Abs: 1.6 10*3/uL (ref 0.7–4.0)
MCH: 31.6 pg (ref 26.0–34.0)
MCHC: 31.6 g/dL (ref 30.0–36.0)
MCV: 100 fL (ref 80.0–100.0)
Monocytes Absolute: 0.6 10*3/uL (ref 0.1–1.0)
Monocytes Relative: 10 %
Neutro Abs: 3.5 10*3/uL (ref 1.7–7.7)
Neutrophils Relative %: 61 %
Platelets: 192 10*3/uL (ref 150–400)
RBC: 3.58 MIL/uL — ABNORMAL LOW (ref 4.22–5.81)
RDW: 14.4 % (ref 11.5–15.5)
WBC: 5.8 10*3/uL (ref 4.0–10.5)
nRBC: 0.3 % — ABNORMAL HIGH (ref 0.0–0.2)

## 2019-07-08 LAB — COMPREHENSIVE METABOLIC PANEL
ALT: 14 U/L (ref 0–44)
AST: 25 U/L (ref 15–41)
Albumin: 3.2 g/dL — ABNORMAL LOW (ref 3.5–5.0)
Alkaline Phosphatase: 65 U/L (ref 38–126)
Anion gap: 8 (ref 5–15)
BUN: 15 mg/dL (ref 8–23)
CO2: 27 mmol/L (ref 22–32)
Calcium: 8.6 mg/dL — ABNORMAL LOW (ref 8.9–10.3)
Chloride: 105 mmol/L (ref 98–111)
Creatinine, Ser: 1.58 mg/dL — ABNORMAL HIGH (ref 0.61–1.24)
GFR calc Af Amer: 45 mL/min — ABNORMAL LOW (ref >60)
GFR calc non Af Amer: 39 mL/min — ABNORMAL LOW (ref >60)
Glucose, Bld: 123 mg/dL — ABNORMAL HIGH (ref 70–99)
Potassium: 5.1 mmol/L (ref 3.5–5.1)
Sodium: 140 mmol/L (ref 135–145)
Total Bilirubin: 1 mg/dL (ref 0.3–1.2)
Total Protein: 7.8 g/dL (ref 6.5–8.1)

## 2019-07-08 LAB — LACTIC ACID, PLASMA
Lactic Acid, Venous: 0.8 mmol/L (ref 0.5–1.9)
Lactic Acid, Venous: 0.7 mmol/L (ref 0.5–1.9)

## 2019-07-08 LAB — URINALYSIS, ROUTINE W REFLEX MICROSCOPIC
Bilirubin Urine: NEGATIVE
Glucose, UA: NEGATIVE mg/dL
Hgb urine dipstick: NEGATIVE
Ketones, ur: NEGATIVE mg/dL
Nitrite: NEGATIVE
Protein, ur: NEGATIVE mg/dL
Specific Gravity, Urine: 1.005 (ref 1.005–1.030)
pH: 6 (ref 5.0–8.0)

## 2019-07-08 LAB — GLUCOSE, CAPILLARY: Glucose-Capillary: 167 mg/dL — ABNORMAL HIGH (ref 70–99)

## 2019-07-08 LAB — TROPONIN I (HIGH SENSITIVITY)
Troponin I (High Sensitivity): 6 ng/L (ref <18)
Troponin I (High Sensitivity): 7 ng/L (ref <18)

## 2019-07-08 LAB — CK: Total CK: 59 U/L (ref 49–397)

## 2019-07-08 MED ORDER — ACETAMINOPHEN 325 MG PO TABS
650.0000 mg | ORAL_TABLET | Freq: Four times a day (QID) | ORAL | Status: DC | PRN
  Administered 2019-07-08: 21:00:00 650 mg via ORAL
  Filled 2019-07-08: qty 2

## 2019-07-08 MED ORDER — SERTRALINE HCL 50 MG PO TABS
50.0000 mg | ORAL_TABLET | Freq: Every day | ORAL | Status: DC
  Administered 2019-07-09 – 2019-07-11 (×3): 50 mg via ORAL
  Filled 2019-07-08 (×3): qty 1

## 2019-07-08 MED ORDER — INSULIN GLARGINE 100 UNIT/ML ~~LOC~~ SOLN
10.0000 [IU] | Freq: Every morning | SUBCUTANEOUS | Status: DC
  Administered 2019-07-09 – 2019-07-10 (×2): 10 [IU] via SUBCUTANEOUS
  Filled 2019-07-08 (×5): qty 0.1

## 2019-07-08 MED ORDER — CIPROFLOXACIN HCL 250 MG PO TABS
500.0000 mg | ORAL_TABLET | Freq: Once | ORAL | Status: AC
  Administered 2019-07-08: 500 mg via ORAL

## 2019-07-08 MED ORDER — TAMSULOSIN HCL 0.4 MG PO CAPS
0.4000 mg | ORAL_CAPSULE | Freq: Every day | ORAL | Status: DC
  Administered 2019-07-08 – 2019-07-12 (×5): 0.4 mg via ORAL
  Filled 2019-07-08 (×5): qty 1

## 2019-07-08 MED ORDER — HALOPERIDOL LACTATE 5 MG/ML IJ SOLN
5.0000 mg | Freq: Once | INTRAMUSCULAR | Status: AC
  Administered 2019-07-08: 5 mg via INTRAVENOUS
  Filled 2019-07-08: qty 1

## 2019-07-08 MED ORDER — ACETAMINOPHEN 650 MG RE SUPP: 650.0000 mg | Freq: Four times a day (QID) | RECTAL | Status: DC | PRN

## 2019-07-08 MED ORDER — ONDANSETRON HCL 4 MG PO TABS: 4.0000 mg | ORAL_TABLET | Freq: Four times a day (QID) | ORAL | Status: DC | PRN

## 2019-07-08 MED ORDER — ENOXAPARIN SODIUM 60 MG/0.6ML ~~LOC~~ SOLN
60.0000 mg | SUBCUTANEOUS | Status: DC
  Administered 2019-07-08 – 2019-07-11 (×4): 60 mg via SUBCUTANEOUS
  Filled 2019-07-08 (×4): qty 0.6

## 2019-07-08 MED ORDER — PANTOPRAZOLE SODIUM 40 MG PO TBEC
40.0000 mg | DELAYED_RELEASE_TABLET | Freq: Every day | ORAL | Status: DC
  Administered 2019-07-08 – 2019-07-12 (×5): 40 mg via ORAL
  Filled 2019-07-08 (×5): qty 1

## 2019-07-08 MED ORDER — INSULIN ASPART 100 UNIT/ML ~~LOC~~ SOLN
0.0000 [IU] | Freq: Three times a day (TID) | SUBCUTANEOUS | Status: DC
  Administered 2019-07-09: 1 [IU] via SUBCUTANEOUS
  Administered 2019-07-09: 2 [IU] via SUBCUTANEOUS
  Administered 2019-07-10: 18:00:00 1 [IU] via SUBCUTANEOUS
  Administered 2019-07-10 – 2019-07-11 (×2): 2 [IU] via SUBCUTANEOUS
  Administered 2019-07-11: 09:00:00 1 [IU] via SUBCUTANEOUS
  Administered 2019-07-12: 13:00:00 2 [IU] via SUBCUTANEOUS
  Administered 2019-07-12 (×2): 1 [IU] via SUBCUTANEOUS
  Administered 2019-07-13: 13:00:00 3 [IU] via SUBCUTANEOUS

## 2019-07-08 MED ORDER — ATENOLOL 25 MG PO TABS
25.0000 mg | ORAL_TABLET | Freq: Every day | ORAL | Status: DC
  Administered 2019-07-08 – 2019-07-12 (×5): 25 mg via ORAL
  Filled 2019-07-08 (×5): qty 1

## 2019-07-08 MED ORDER — GLIPIZIDE ER 5 MG PO TB24
10.0000 mg | ORAL_TABLET | Freq: Every day | ORAL | Status: DC
  Administered 2019-07-09 – 2019-07-10 (×2): 10 mg via ORAL
  Filled 2019-07-08 (×2): qty 2

## 2019-07-08 MED ORDER — ONDANSETRON HCL 4 MG/2ML IJ SOLN: 4.0000 mg | Freq: Four times a day (QID) | INTRAMUSCULAR | Status: DC | PRN

## 2019-07-08 MED ORDER — LACTATED RINGERS IV SOLN
INTRAVENOUS | Status: AC
  Administered 2019-07-08 – 2019-07-09 (×2): via INTRAVENOUS

## 2019-07-08 MED ORDER — DONEPEZIL HCL 5 MG PO TABS
10.0000 mg | ORAL_TABLET | Freq: Every day | ORAL | Status: DC
  Administered 2019-07-08 – 2019-07-10 (×3): 10 mg via ORAL
  Filled 2019-07-08 (×4): qty 2

## 2019-07-08 NOTE — Assessment & Plan Note (Signed)
Need to f/u with in home PT as pt remains at high fall risk due to his chronic medical conditions, home safety reviewed with his son

## 2019-07-08 NOTE — ED Triage Notes (Signed)
Pt brought in by ems for evaluation of fall yesterday. Pt has some baseline confusion making it hard to assess what happened. He lives with his son, who works, and has a Actuary 4 hours per day. He fell sometime in the morning and was on the floor until his sitter got there, approximately 4 hours later. Abrasion to head and pain in neck, left shoulder, and left hip per ems. Pt denies pain at this time and was ambulatory with his walker.

## 2019-07-08 NOTE — ED Notes (Signed)
ED TO INPATIENT HANDOFF REPORT  ED Nurse Name and Phone #: [phone]   S Name/Age/Gender Patsi Sears 85 y.o. male Room/Bed: APA09/APA09  Code Status   Code Status: Prior  Home/SNF/Other Home Patient oriented to: self Is this baseline? Yes   Triage Complete: Triage complete  Chief Complaint Weakness  Triage Note Pt brought in by ems for evaluation of fall yesterday. Pt has some baseline confusion making it hard to assess what happened. He lives with his son, who works, and has a Actuary 4 hours per day. He fell sometime in the morning and was on the floor until his sitter got there, approximately 4 hours later. Abrasion to head and pain in neck, left shoulder, and left hip per ems. Pt denies pain at this time and was ambulatory with his walker.    Allergies Allergies  Allergen Reactions  . Zosyn [Piperacillin Sod-Tazobactam So] Anaphylaxis, Swelling and Other (See Comments)    Facial swelling Has patient had a PCN reaction causing immediate rash, facial/tongue/throat swelling, SOB or lightheadedness with hypotension: Yes Has patient had a PCN reaction causing severe rash involving mucus membranes or skin necrosis: No Has patient had a PCN reaction that required hospitalization: No Has patient had a PCN reaction occurring within the last 10 years: Yes If all of the above answers are "NO", then may proceed with Cephalosporin use.   . Ace Inhibitors Cough  . Vancomycin Swelling    Facial swelling    Level of Care/Admitting Diagnosis ED Disposition    ED Disposition Condition [facility] Area: [facility] [[id number]]  Level of Care: Med-Surg [16]  Covid Evaluation: Asymptomatic Screening Protocol (No Symptoms)  Diagnosis: Falls [id number]  Admitting Physician: Bethena Roys [[id number]]  Attending Physician: Bethena Roys Nessa.Cuff  PT Class (Do Not Modify): Observation [104]  PT Acc Code (Do Not Modify): Observation [10022]        B Medical/Surgery History Past Medical History:  Diagnosis Date  . Acute cholecystitis 11/2012   Drained percutaneously  . Anxiety   . BPH (benign prostatic hypertrophy)   . Deep venous thrombosis (HCC)    Left leg, diagnosed 7/12  . Dementia [facility])    Needs supervision for safety per son - POA  . Diverticulitis   . Essential hypertension   . GERD (gastroesophageal reflux disease)   . History of pneumonia    Prior to 2012  . Incontinence of urine   . Mixed hyperlipidemia   . Osteoarthritis   . Pulmonary embolism ([place])    Diagnosed 7/12  . Type 2 diabetes mellitus ([place])    Past Surgical History:  Procedure Laterality Date  . CARPAL TUNNEL RELEASE    . COLONOSCOPY  08/31/2012   Procedure: COLONOSCOPY;  Surgeon: Rogene Houston, MD;  Location: [facility];  Service: Endoscopy;  Laterality: N/A;  830  . CYSTOSCOPY WITH INJECTION N/A 05/15/2018   Procedure: CYSTOSCOPY WITH BOTOX INJECTION;  Surgeon: Cleon Gustin, MD;  Location: [facility] ORS;  Service: Urology;  Laterality: N/A;  . CYSTOSCOPY WITH INJECTION N/A 11/10/2018   Procedure: CYSTOSCOPY WITH BOTOX  INJECTION;  Surgeon: Cleon Gustin, MD;  Location: [facility] ORS;  Service: Urology;  Laterality: N/A;  30 MINS  . CYSTOSCOPY WITH INSERTION OF UROLIFT N/A 09/02/2016   Procedure: CYSTOSCOPY WITH INSERTION OF UROLIFT;  Surgeon: Cleon Gustin, MD;  Location: [facility];  Service: Urology;  Laterality: N/A;  . EYE SURGERY Bilateral  bilateral cataract  . Right total knee replacement  04/23/2011  . SPINE SURGERY  prior to 2012   Neck fusion  . UMBILICAL HERNIA REPAIR     umbilical     A IV Location/Drains/Wounds Patient Lines/Drains/Airways Status   Active Line/Drains/Airways    Name:   Placement date:   Placement time:   Site:   Days:   Incision (Closed) 11/10/18 Penis   11/10/18    1302     240          Intake/Output Last 24 hours No intake or output data in the 24 hours ending 07/08/19  1925  Labs/Imaging Results for orders placed or performed during the hospital encounter of 07/08/19 (from the past 48 hour(s))  Comprehensive metabolic panel     Status: Abnormal   Collection Time: 07/08/19 10:39 AM  Result Value Ref Range   Sodium 140 135 - 145 mmol/L   Potassium 5.1 3.5 - 5.1 mmol/L   Chloride 105 98 - 111 mmol/L   CO2 27 22 - 32 mmol/L   Glucose, Bld 123 (H) 70 - 99 mg/dL   BUN 15 8 - 23 mg/dL   Creatinine, Ser 1.58 (H) 0.61 - 1.24 mg/dL   Calcium 8.6 (L) 8.9 - 10.3 mg/dL   Total Protein 7.8 6.5 - 8.1 g/dL   Albumin 3.2 (L) 3.5 - 5.0 g/dL   AST 25 15 - 41 U/L   ALT 14 0 - 44 U/L   Alkaline Phosphatase 65 38 - 126 U/L   Total Bilirubin 1.0 0.3 - 1.2 mg/dL   GFR calc non Af Amer 39 (L) >60 mL/min   GFR calc Af Amer 45 (L) >60 mL/min   Anion gap 8 5 - 15    Comment: Performed at [facility], [address] [place], [place] [address]  Troponin I (High Sensitivity)     Status: None   Collection Time: 07/08/19 10:39 AM  Result Value Ref Range   Troponin I (High Sensitivity) 6 <18 ng/L    Comment: (NOTE) Elevated high sensitivity troponin I (hsTnI) values and significant  changes across serial measurements may suggest ACS but many other  chronic and acute conditions are known to elevate hsTnI results.  Refer to the "Links" section for chest pain algorithms and additional  guidance. Performed at [facility], [address], [place], [place] [address]   Lactic acid, plasma     Status: None   Collection Time: 07/08/19 10:39 AM  Result Value Ref Range   Lactic Acid, Venous 0.8 0.5 - 1.9 mmol/L    Comment: Performed at [facility], [address] [place], [place] [address]  CBC with Differential     Status: Abnormal   Collection Time: 07/08/19 10:39 AM  Result Value Ref Range   WBC 5.8 4.0 - 10.5 K/uL   RBC 3.58 (L) 4.22 - 5.81 MIL/uL   Hemoglobin 11.3 (L) 13.0 - 17.0 g/dL   HCT 35.8 (L) 39.0 - 52.0 %   MCV 100.0 80.0 - 100.0 fL   MCH 31.6 26.0 - 34.0 pg    MCHC 31.6 30.0 - 36.0 g/dL   RDW 14.4 11.5 - 15.5 %   Platelets 192 150 - 400 K/uL   nRBC 0.3 (H) 0.0 - 0.2 %   Neutrophils Relative % 61 %   Neutro Abs 3.5 1.7 - 7.7 K/uL   Lymphocytes Relative 28 %   Lymphs Abs 1.6 0.7 - 4.0 K/uL   Monocytes Relative 10 %  Monocytes Absolute 0.6 0.1 - 1.0 K/uL   Eosinophils Relative 1 %   Eosinophils Absolute 0.1 0.0 - 0.5 K/uL   Basophils Relative 0 %   Basophils Absolute 0.0 0.0 - 0.1 K/uL   Immature Granulocytes 0 %   Abs Immature Granulocytes 0.01 0.00 - 0.07 K/uL    Comment: Performed at [facility], [address], [place], [place] [address]  CK     Status: None   Collection Time: 07/08/19 10:39 AM  Result Value Ref Range   Total CK 59 49 - 397 U/L    Comment: Performed at [facility], [address] [place], [place] [address]  Lactic acid, plasma     Status: None   Collection Time: 07/08/19 12:40 PM  Result Value Ref Range   Lactic Acid, Venous 0.7 0.5 - 1.9 mmol/L    Comment: Performed at [facility], [address], [place], [place] [address]  Troponin I (High Sensitivity)     Status: None   Collection Time: 07/08/19 12:40 PM  Result Value Ref Range   Troponin I (High Sensitivity) 7 <18 ng/L    Comment: (NOTE) Elevated high sensitivity troponin I (hsTnI) values and significant  changes across serial measurements may suggest ACS but many other  chronic and acute conditions are known to elevate hsTnI results.  Refer to the "Links" section for chest pain algorithms and additional  guidance. Performed at [facility], [address], [place], [place] [address]   Urinalysis, Routine w reflex microscopic     Status: Abnormal   Collection Time: 07/08/19  3:12 PM  Result Value Ref Range   Color, Urine YELLOW YELLOW   APPearance HAZY (A) CLEAR   Specific Gravity, Urine 1.005 1.005 - 1.030   pH 6.0 5.0 - 8.0   Glucose, UA NEGATIVE NEGATIVE mg/dL   Hgb urine dipstick NEGATIVE NEGATIVE   Bilirubin Urine NEGATIVE NEGATIVE   Ketones, ur  NEGATIVE NEGATIVE mg/dL   Protein, ur NEGATIVE NEGATIVE mg/dL   Nitrite NEGATIVE NEGATIVE   Leukocytes,Ua SMALL (A) NEGATIVE   RBC / HPF 0-5 0 - 5 RBC/hpf   WBC, UA 11-20 0 - 5 WBC/hpf   Bacteria, UA RARE (A) NONE SEEN   Squamous Epithelial / LPF 0-5 0 - 5    Comment: Performed at [facility], [address], [place],  [address]   Dg Chest 2 View  Result Date: 07/08/2019 CLINICAL DATA:  85 year old male with history of trauma from a fall. Confusion. EXAM: CHEST - 2 VIEW COMPARISON:  Chest x-ray 05/22/2019. FINDINGS: Lung volumes are low. No pneumothorax. Diffuse peribronchial cuffing. Widespread interstitial prominence throughout the lungs bilaterally, increased compared to the prior examination, particularly throughout the mid to lower lungs. Mild scarring in the periphery of the right lung base, similar to prior examination. Cephalization of the pulmonary vasculature. No definite pleural effusions. Mild cardiomegaly. The patient is rotated to the right on today's exam, resulting in distortion of the mediastinal contours and reduced diagnostic sensitivity and specificity for mediastinal pathology. Aortic atherosclerosis. Visualized bony thorax appears grossly intact. IMPRESSION: 1. No signs of significant acute traumatic injury to the thorax. 2. The appearance the chest is unusual. The possibility of congestive heart failure is considered, however, clinical correlation for signs and symptoms of severe acute bronchitis is recommended. 3. Aortic atherosclerosis. Electronically Signed   By: Vinnie Langton M.D.   On: 07/08/2019 13:05   Dg Lumbar Spine 2-3 Views  Result Date: 07/08/2019 CLINICAL DATA:  Fall.  Pain.  EXAM: LUMBAR SPINE - 2-3 VIEW COMPARISON:  None. FINDINGS: AP and lateral views. Five lumbar type vertebral bodies. Diminutive twelfth ribs. Sacroiliac joints are symmetric. Radiation seeds in the prostate. Straightening of expected lordosis. Maintenance of vertebral body height. Loss  of intervertebral disc height at L3-4 and L4-5. Aortic atherosclerosis. Facet arthropathy at L4-5 and L5-S1. IMPRESSION: Spondylosis, without acute osseous abnormality. Aortic Atherosclerosis (ICD10-I70.0). Electronically Signed   By: Abigail Miyamoto M.D.   On: 07/08/2019 13:06   Ct Head Wo Contrast  Result Date: 07/08/2019 CLINICAL DATA:  85 year old male with history of head trauma from a fall yesterday. EXAM: CT HEAD WITHOUT CONTRAST CT CERVICAL SPINE WITHOUT CONTRAST TECHNIQUE: Multidetector CT imaging of the head and cervical spine was performed following the standard protocol without intravenous contrast. Multiplanar CT image reconstructions of the cervical spine were also generated. COMPARISON:  Head CT 05/22/2019.  Cervical spine CT 08/03/2018. FINDINGS: CT HEAD FINDINGS Brain: Cerebral atrophy. Patchy and confluent areas of decreased attenuation are noted throughout the deep and periventricular white matter of the cerebral hemispheres bilaterally, compatible with chronic microvascular ischemic disease. Physiologic calcifications in the basal ganglia bilaterally incidentally noted. No evidence of acute infarction, hemorrhage, hydrocephalus, extra-axial collection or mass lesion/mass effect. Vascular: No hyperdense vessel or unexpected calcification. Skull: Normal. Negative for fracture or focal lesion. Sinuses/Orbits: No acute finding. Other: None. CT CERVICAL SPINE FINDINGS Alignment: Straightening of normal cervical lordosis, presumably positional. Alignment is otherwise anatomic. Skull base and vertebrae: Status post ACDF from C4-C6 with interbody grafts at C4-C5 and C5-C6. No acute fracture. No primary bone lesion or focal pathologic process. Soft tissues and spinal canal: No prevertebral fluid or swelling. No visible canal hematoma. Disc levels: Severe multilevel degenerative disc disease, most pronounced at C3-C4. Moderate multilevel facet arthropathy. Upper chest: Negative. Other: There are no  aggressive appearing lytic or blastic lesions noted in the visualized portions of the skeleton. IMPRESSION: 1. No evidence of significant acute traumatic injury to the skull, brain or cervical spine. 2. Mild cerebral atrophy with extensive chronic microvascular ischemic changes in the cerebral white matter, as above. 3. Status post ACDF at C4-C6 with multilevel degenerative disc disease and cervical spondylosis, as above. Electronically Signed   By: Vinnie Langton M.D.   On: 07/08/2019 12:30   Ct Cervical Spine Wo Contrast  Result Date: 07/08/2019 CLINICAL DATA:  85 year old male with history of head trauma from a fall yesterday. EXAM: CT HEAD WITHOUT CONTRAST CT CERVICAL SPINE WITHOUT CONTRAST TECHNIQUE: Multidetector CT imaging of the head and cervical spine was performed following the standard protocol without intravenous contrast. Multiplanar CT image reconstructions of the cervical spine were also generated. COMPARISON:  Head CT 05/22/2019.  Cervical spine CT 08/03/2018. FINDINGS: CT HEAD FINDINGS Brain: Cerebral atrophy. Patchy and confluent areas of decreased attenuation are noted throughout the deep and periventricular white matter of the cerebral hemispheres bilaterally, compatible with chronic microvascular ischemic disease. Physiologic calcifications in the basal ganglia bilaterally incidentally noted. No evidence of acute infarction, hemorrhage, hydrocephalus, extra-axial collection or mass lesion/mass effect. Vascular: No hyperdense vessel or unexpected calcification. Skull: Normal. Negative for fracture or focal lesion. Sinuses/Orbits: No acute finding. Other: None. CT CERVICAL SPINE FINDINGS Alignment: Straightening of normal cervical lordosis, presumably positional. Alignment is otherwise anatomic. Skull base and vertebrae: Status post ACDF from C4-C6 with interbody grafts at C4-C5 and C5-C6. No acute fracture. No primary bone lesion or focal pathologic process. Soft tissues and spinal canal: No  prevertebral fluid or swelling. No visible canal hematoma. Disc  levels: Severe multilevel degenerative disc disease, most pronounced at C3-C4. Moderate multilevel facet arthropathy. Upper chest: Negative. Other: There are no aggressive appearing lytic or blastic lesions noted in the visualized portions of the skeleton. IMPRESSION: 1. No evidence of significant acute traumatic injury to the skull, brain or cervical spine. 2. Mild cerebral atrophy with extensive chronic microvascular ischemic changes in the cerebral white matter, as above. 3. Status post ACDF at C4-C6 with multilevel degenerative disc disease and cervical spondylosis, as above. Electronically Signed   By: Vinnie Langton M.D.   On: 07/08/2019 12:30   Dg Hips Bilat W Or Wo Pelvis 5 Views  Result Date: 07/08/2019 CLINICAL DATA:  Fall and pain. EXAM: DG HIP (WITH OR WITHOUT PELVIS) 5+V BILAT COMPARISON:  08/03/2018 pelvis radiographs. FINDINGS: Femoral heads are located. Sacroiliac joints are symmetric. No acute fracture. Radiation seeds in the prostate. Presumed phleboliths in the pelvis. IMPRESSION: No acute osseous abnormality. Electronically Signed   By: Abigail Miyamoto M.D.   On: 07/08/2019 13:04    Pending Labs Unresulted Labs (From admission, onward)    Start     Ordered   07/08/19 1451  SARS CORONAVIRUS 2 Nasal Swab Aptima Multi Swab  (Asymptomatic/Tier 2 Patients Labs)  Once,   STAT    Question Answer Comment  Is this test for diagnosis or screening Screening   Symptomatic for COVID-19 as defined by CDC No   Hospitalized for COVID-19 No   Admitted to ICU for COVID-19 No   Previously tested for COVID-19 Yes   Resident in a congregate (group) care setting No   Employed in healthcare setting No      07/08/19 1450   07/08/19 1034  Urine culture  ONCE - STAT,   STAT     07/08/19 1035          Vitals/Pain Today's Vitals   07/08/19 1600 07/08/19 1630 07/08/19 1700 07/08/19 1730  BP: (!) 148/85 (!) 141/84 (!) 143/97 (!)  159/98  Pulse: 81 77 77 86  Resp:      Temp:      TempSrc:      SpO2: 98% 99% 100% 90%  Weight:      Height:      PainSc:        Isolation Precautions No active isolations  Medications Medications  ciprofloxacin (CIPRO) tablet 500 mg (500 mg Oral Given 07/08/19 1847)    Mobility walks Low fall risk   Focused Assessments    R Recommendations: See Admitting Provider Note  Report given to:   Additional Notes:

## 2019-07-08 NOTE — ED Provider Notes (Signed)
[facility] EMERGENCY DEPARTMENT Provider Note   CSN: [id number] Arrival date & time: 07/08/19  1000     History   Chief Complaint Chief Complaint  Patient presents with   Fall    HPI Samuel Ryan is a 85 y.o. male.     The history is provided by the patient, the EMS personnel and a relative. The history is limited by the condition of the patient (Hx dementia).  Fall  Pt was seen at 1025. Per EMS, pt's family and pt:  Pt s/p fall this early morning, apparently laying on the floor until his sitter arrived. Pt states he also fell yesterday and "passed out."  Pt states he "just fell out." Pt ambulates with walker at baseline. Pt has significant hx of dementia. Denies CP/SOB, no abd pain, no N/V/D, no back pain, no focal motor weakness, no fevers.   Past Medical History:  Diagnosis Date   Acute cholecystitis 11/2012   Drained percutaneously   Anxiety    BPH (benign prostatic hypertrophy)    Deep venous thrombosis (HCC)    Left leg, diagnosed 7/12   Dementia [facility])    Needs supervision for safety per son - POA   Diverticulitis    Essential hypertension    GERD (gastroesophageal reflux disease)    History of pneumonia    Prior to 2012   Incontinence of urine    Mixed hyperlipidemia    Osteoarthritis    Pulmonary embolism ([place])    Diagnosed 7/12   Type 2 diabetes mellitus ([place])     Patient Active Problem List   Diagnosis Date Noted   Bilateral hand pain 11/28/2018   Knee pain, left 10/10/2018   History of total knee arthroplasty, right 10/10/2018   Unilateral primary osteoarthritis, left knee 10/10/2018   Paroxysmal SVT (supraventricular tachycardia) ([place]) 09/25/2018   Uncontrolled type 2 diabetes mellitus with hyperglycemia ([place]) 09/22/2018   Dementia without behavioral disturbance ([place]) 09/22/2018   Fall 08/10/2018   Morbid obesity ([place]) 06/12/2018   Anemia 09/14/2017   CKD (chronic kidney disease) stage 3, GFR 30-59 ml/min (HCC)  09/14/2017   Atrial fibrillation ([place]) 09/14/2017   Hypoalbuminemia 11/23/2016   Hypoxia 10/29/2016   Urinary frequency 09/17/2015   At high risk for falls 03/30/2015   Generalized osteoarthritis 07/16/2014   Urinary incontinence 07/16/2014   Pulmonary interstitial fibrosis ([place]) 07/03/2014   Abnormal CXR (chest x-ray) 05/27/2014   Thyroid mass of unclear etiology 05/07/2014   Vitamin D deficiency 01/31/2014   Allergic rhinitis 01/31/2014   Diverticulosis of sigmoid colon 12/13/2012   Right ventricular dysfunction 07/20/2011   Heart disease, unspecified 07/20/2011   Alzheimer's dementia without behavioral disturbance ([place]) 09/12/2010   Goiter 06/19/2009   Essential hypertension 12/11/2008   Uncontrolled diabetes mellitus with diabetic nephropathy, with long-term current use of insulin ([place]) 09/05/2008   Hyperlipidemia with target LDL less than 100 07/19/2008   BPH (benign prostatic hyperplasia) 03/28/2008    Past Surgical History:  Procedure Laterality Date   CARPAL TUNNEL RELEASE     COLONOSCOPY  08/31/2012   Procedure: COLONOSCOPY;  Surgeon: Rogene Houston, MD;  Location: [facility] ENDO SUITE;  Service: Endoscopy;  Laterality: N/A;  [place] N/A 05/15/2018   Procedure: CYSTOSCOPY WITH BOTOX INJECTION;  Surgeon: Cleon Gustin, MD;  Location: [facility] ORS;  Service: Urology;  Laterality: N/A;   CYSTOSCOPY WITH INJECTION N/A 11/10/2018   Procedure: CYSTOSCOPY WITH BOTOX  INJECTION;  Surgeon: Cleon Gustin, MD;  Location:  [facility] ORS;  Service: Urology;  Laterality: N/A;  [place] N/A 09/02/2016   Procedure: CYSTOSCOPY WITH INSERTION OF UROLIFT;  Surgeon: Cleon Gustin, MD;  Location: [facility];  Service: Urology;  Laterality: N/A;   EYE SURGERY Bilateral    bilateral cataract   Right total knee replacement  04/23/2011   SPINE SURGERY  prior to 2012   Neck fusion   [place]     umbilical        Home Medications    Prior to Admission medications   Medication Sig Start Date End Date Taking? Authorizing Provider  acetaminophen (TYLENOL) 500 MG tablet Take 1,000 mg by mouth every 6 (six) hours as needed for mild pain, moderate pain or headache.     [provider]  Alcohol Swabs (ALCOHOL PREP) PADS Use four times daily to test blood sugar 05/28/19   [name], MD  atenolol (TENORMIN) 25 MG tablet Take 25 mg by mouth daily.    [provider]  Blood Glucose Monitoring Suppl (TRUE METRIX METER) w/Device KIT With supplies, alcohol swabs as covered by insurance 09/22/18   [name], MD  clobetasol cream (TEMOVATE) 4.03 % APPLY 1 APPLICATION TOPICALLY 2 (TWO) TIMES DAILY. 04/13/19   [name], MD  donepezil (ARICEPT) 10 MG tablet TAKE 1 TABLET AT BEDTIME 05/17/19   [name], MD  Ferrous Sulfate (IRON) 325 (65 Fe) MG TABS Take 325 mg by mouth daily.     [provider]  furosemide (LASIX) 20 MG tablet Take 1 tablet by mouth daily. 02/24/19   [provider]  glipiZIDE (GLUCOTROL XL) 10 MG 24 hr tablet Take 1 tablet (10 mg total) by mouth daily with breakfast. 07/02/19   [name], MD  glucose blood (TRUE METRIX BLOOD GLUCOSE TEST) test strip CHECK BLOOD SUGAR FOUR DAILY OR AS DIRECTED 06/27/19   [name], MD  Insulin Glargine (LANTUS SOLOSTAR) 100 UNIT/ML Solostar Pen Inject 23 Units into the skin every morning. HOLD for blood sugar levels below 100 05/31/19   [name], MD  Insulin Pen Needle (PEN NEEDLES) 31G X 5 MM MISC 1 each by Does not apply route as directed. Use to inject lantus daily dx E11.65 02/14/18   [name], MD  Insulin Pen Needle 29G X 5MM MISC 1 each by Does not apply route as directed. 09/22/18   [name], MD  loratadine (CLARITIN) 10 MG tablet Take 10 mg by mouth every morning.     [provider]  lovastatin  (MEVACOR) 40 MG tablet TAKE 1 TABLET AT BEDTIME 02/26/19   [name], MD  omeprazole (PRILOSEC) 40 MG capsule TAKE 1 CAPSULE TWICE DAILY 06/17/19   Perlie Mayo, NP  sertraline (ZOLOFT) 50 MG tablet Take 1 tablet (50 mg total) by mouth daily. 01/22/19   [name], MD  tamsulosin (FLOMAX) 0.4 MG CAPS capsule TAKE 1 CAPSULE EVERY DAY 06/17/19   Perlie Mayo, NP  traMADol Veatrice [place]) 50 MG tablet Take one tablet by mouth two times daily for chronic joint pain 07/02/19   [name], MD  Trospium Chloride 60 MG CP24 Take 60 mg by mouth daily.  06/02/17   [provider]  TRUEplus Lancets 33G MISC USE TO TEST TWO TIMES A DAY 07/02/19   [name], MD  Vitamin D, Ergocalciferol, (DRISDOL) 1.25 MG (50000 UT) CAPS  capsule Take 1 capsule (50,000 Units total) by mouth every 7 (seven) days. 07/02/19   [name], MD    Family History Family History  Problem Relation Age of Onset   Cancer Mother        Stomach   Diabetes Sister    Hypertension Sister    Diabetes Brother    Cancer Brother        Gallbladder   Seizures Son        due to meningitis as a child    Social History Social History   Tobacco Use   Smoking status: Never Smoker   Smokeless tobacco: Never Used  Substance Use Topics   Alcohol use: No   Drug use: No     Allergies   Zosyn [piperacillin sod-tazobactam so], Ace inhibitors, and Vancomycin   Review of Systems Review of Systems  Unable to perform ROS: Dementia     Physical Exam Updated Vital Signs BP 136/88 (BP Location: Left Arm)    Pulse 97    Temp 98.1 F (36.7 C) (Oral)    Resp 18    Ht 6' (1.829 m)    Wt 116 kg    SpO2 91%    BMI 34.68 kg/m   Physical Exam 1030: Physical examination: Vital signs and O2 SAT: Reviewed; Constitutional: Well developed, Well nourished, Well hydrated, In no acute distress; Head and Face: Normocephalic, +very superficial abrasion to proximal forehead/vertex scalp; Eyes: EOMI,  PERRL, No scleral icterus; ENMT: Mouth and pharynx normal, Left TM normal, Right TM normal, Mucous membranes moist; Neck: Supple, Trachea midline. No abrasions or ecchymosis.; Spine:, No midline CS, TS, LS tenderness.; Cardiovascular: Regular rate and rhythm, No gallop; Respiratory: Breath sounds clear & equal bilaterally, No wheezes, Normal respiratory effort/excursion; Chest: Nontender, No deformity, Movement normal, No crepitus, No abrasions or ecchymosis.; Abdomen: Soft, Nontender, Nondistended, Normal bowel sounds, No abrasions or ecchymosis.; Genitourinary: No CVA tenderness;; Extremities: +right hip tenderness with elevation of extended RLE off stretcher. Otherwise full range of motion major/large joints of bilat UE's and LE's without pain or tenderness to palp, Neurovascularly intact, Pulses normal, No deformity. No tenderness, +1 pedal edema bilat, no calf asymmetry. Pelvis stable; Neuro: Awake, alert, confused per hx dementia. No facial droop. Speech clear. Grips equal. Strength 5/5 bilat UE's and LLE, 3/5 RLE d/t pt c/o right hip pain. Otherwise no gross focal motor deficits in extremities.; Skin: Color normal, Warm, Dry   ED Treatments / Results  Labs (all labs ordered are listed, but only abnormal results are displayed)   EKG EKG Interpretation  Date/Time:  Sunday July 08 2019 11:49:06 EDT Ventricular Rate:  94 PR Interval:    QRS Duration: 147 QT Interval:  370 QTC Calculation: 463 R Axis:   72 Text Interpretation:  Wide QRS rhythm Right bundle branch block When compared with ECG of 11/20/2018, 09/25/2018 No significant change was found Confirmed by Francine Graven [phone]) on 07/08/2019 12:15:10 PM   Radiology   Procedures Procedures (including critical care time)  Medications Ordered in ED Medications - No data to display   Initial Impression / Assessment and Plan / ED Course  I have reviewed the triage vital signs and the nursing notes.  Pertinent labs & imaging  results that were available during my care of the patient were reviewed by me and considered in my medical decision making (see chart for details).    MDM Reviewed: previous chart, nursing note and vitals Reviewed previous: labs and ECG Interpretation: labs, ECG,  x-ray and CT scan   Results for orders placed or performed during the hospital encounter of 07/08/19  Comprehensive metabolic panel  Result Value Ref Range   Sodium 140 135 - 145 mmol/L   Potassium 5.1 3.5 - 5.1 mmol/L   Chloride 105 98 - 111 mmol/L   CO2 27 22 - 32 mmol/L   Glucose, Bld 123 (H) 70 - 99 mg/dL   BUN 15 8 - 23 mg/dL   Creatinine, Ser 1.58 (H) 0.61 - 1.24 mg/dL   Calcium 8.6 (L) 8.9 - 10.3 mg/dL   Total Protein 7.8 6.5 - 8.1 g/dL   Albumin 3.2 (L) 3.5 - 5.0 g/dL   AST 25 15 - 41 U/L   ALT 14 0 - 44 U/L   Alkaline Phosphatase 65 38 - 126 U/L   Total Bilirubin 1.0 0.3 - 1.2 mg/dL   GFR calc non Af Amer 39 (L) >60 mL/min   GFR calc Af Amer 45 (L) >60 mL/min   Anion gap 8 5 - 15  Lactic acid, plasma  Result Value Ref Range   Lactic Acid, Venous 0.8 0.5 - 1.9 mmol/L  Lactic acid, plasma  Result Value Ref Range   Lactic Acid, Venous 0.7 0.5 - 1.9 mmol/L  CBC with Differential  Result Value Ref Range   WBC 5.8 4.0 - 10.5 K/uL   RBC 3.58 (L) 4.22 - 5.81 MIL/uL   Hemoglobin 11.3 (L) 13.0 - 17.0 g/dL   HCT 35.8 (L) 39.0 - 52.0 %   MCV 100.0 80.0 - 100.0 fL   MCH 31.6 26.0 - 34.0 pg   MCHC 31.6 30.0 - 36.0 g/dL   RDW 14.4 11.5 - 15.5 %   Platelets 192 150 - 400 K/uL   nRBC 0.3 (H) 0.0 - 0.2 %   Neutrophils Relative % 61 %   Neutro Abs 3.5 1.7 - 7.7 K/uL   Lymphocytes Relative 28 %   Lymphs Abs 1.6 0.7 - 4.0 K/uL   Monocytes Relative 10 %   Monocytes Absolute 0.6 0.1 - 1.0 K/uL   Eosinophils Relative 1 %   Eosinophils Absolute 0.1 0.0 - 0.5 K/uL   Basophils Relative 0 %   Basophils Absolute 0.0 0.0 - 0.1 K/uL   Immature Granulocytes 0 %   Abs Immature Granulocytes 0.01 0.00 - 0.07 K/uL  CK    Result Value Ref Range   Total CK 59 49 - 397 U/L  Troponin I (High Sensitivity)  Result Value Ref Range   Troponin I (High Sensitivity) 6 <18 ng/L  Troponin I (High Sensitivity)  Result Value Ref Range   Troponin I (High Sensitivity) 7 <18 ng/L   Dg Chest 2 View Result Date: 07/08/2019 CLINICAL DATA:  85 year old male with history of trauma from a fall. Confusion. EXAM: CHEST - 2 VIEW COMPARISON:  Chest x-ray 05/22/2019. FINDINGS: Lung volumes are low. No pneumothorax. Diffuse peribronchial cuffing. Widespread interstitial prominence throughout the lungs bilaterally, increased compared to the prior examination, particularly throughout the mid to lower lungs. Mild scarring in the periphery of the right lung base, similar to prior examination. Cephalization of the pulmonary vasculature. No definite pleural effusions. Mild cardiomegaly. The patient is rotated to the right on today's exam, resulting in distortion of the mediastinal contours and reduced diagnostic sensitivity and specificity for mediastinal pathology. Aortic atherosclerosis. Visualized bony thorax appears grossly intact. IMPRESSION: 1. No signs of significant acute traumatic injury to the thorax. 2. The appearance the chest is unusual. The possibility of congestive  heart failure is considered, however, clinical correlation for signs and symptoms of severe acute bronchitis is recommended. 3. Aortic atherosclerosis. Electronically Signed   By: Vinnie Langton M.D.   On: 07/08/2019 13:05   Dg Lumbar Spine 2-3 Views Result Date: 07/08/2019 CLINICAL DATA:  Fall.  Pain. EXAM: LUMBAR SPINE - 2-3 VIEW COMPARISON:  None. FINDINGS: AP and lateral views. Five lumbar type vertebral bodies. Diminutive twelfth ribs. Sacroiliac joints are symmetric. Radiation seeds in the prostate. Straightening of expected lordosis. Maintenance of vertebral body height. Loss of intervertebral disc height at L3-4 and L4-5. Aortic atherosclerosis. Facet arthropathy at  L4-5 and L5-S1. IMPRESSION: Spondylosis, without acute osseous abnormality. Aortic Atherosclerosis (ICD10-I70.0). Electronically Signed   By: Abigail Miyamoto M.D.   On: 07/08/2019 13:06   Ct Head Wo Contrast Result Date: 07/08/2019 CLINICAL DATA:  85 year old male with history of head trauma from a fall yesterday. EXAM: CT HEAD WITHOUT CONTRAST CT CERVICAL SPINE WITHOUT CONTRAST TECHNIQUE: Multidetector CT imaging of the head and cervical spine was performed following the standard protocol without intravenous contrast. Multiplanar CT image reconstructions of the cervical spine were also generated. COMPARISON:  Head CT 05/22/2019.  Cervical spine CT 08/03/2018. FINDINGS: CT HEAD FINDINGS Brain: Cerebral atrophy. Patchy and confluent areas of decreased attenuation are noted throughout the deep and periventricular white matter of the cerebral hemispheres bilaterally, compatible with chronic microvascular ischemic disease. Physiologic calcifications in the basal ganglia bilaterally incidentally noted. No evidence of acute infarction, hemorrhage, hydrocephalus, extra-axial collection or mass lesion/mass effect. Vascular: No hyperdense vessel or unexpected calcification. Skull: Normal. Negative for fracture or focal lesion. Sinuses/Orbits: No acute finding. Other: None. CT CERVICAL SPINE FINDINGS Alignment: Straightening of normal cervical lordosis, presumably positional. Alignment is otherwise anatomic. Skull base and vertebrae: Status post ACDF from C4-C6 with interbody grafts at C4-C5 and C5-C6. No acute fracture. No primary bone lesion or focal pathologic process. Soft tissues and spinal canal: No prevertebral fluid or swelling. No visible canal hematoma. Disc levels: Severe multilevel degenerative disc disease, most pronounced at C3-C4. Moderate multilevel facet arthropathy. Upper chest: Negative. Other: There are no aggressive appearing lytic or blastic lesions noted in the visualized portions of the skeleton.  IMPRESSION: 1. No evidence of significant acute traumatic injury to the skull, brain or cervical spine. 2. Mild cerebral atrophy with extensive chronic microvascular ischemic changes in the cerebral white matter, as above. 3. Status post ACDF at C4-C6 with multilevel degenerative disc disease and cervical spondylosis, as above. Electronically Signed   By: Vinnie Langton M.D.   On: 07/08/2019 12:30   Ct Cervical Spine Wo Contrast Result Date: 07/08/2019 CLINICAL DATA:  85 year old male with history of head trauma from a fall yesterday. EXAM: CT HEAD WITHOUT CONTRAST CT CERVICAL SPINE WITHOUT CONTRAST TECHNIQUE: Multidetector CT imaging of the head and cervical spine was performed following the standard protocol without intravenous contrast. Multiplanar CT image reconstructions of the cervical spine were also generated. COMPARISON:  Head CT 05/22/2019.  Cervical spine CT 08/03/2018. FINDINGS: CT HEAD FINDINGS Brain: Cerebral atrophy. Patchy and confluent areas of decreased attenuation are noted throughout the deep and periventricular white matter of the cerebral hemispheres bilaterally, compatible with chronic microvascular ischemic disease. Physiologic calcifications in the basal ganglia bilaterally incidentally noted. No evidence of acute infarction, hemorrhage, hydrocephalus, extra-axial collection or mass lesion/mass effect. Vascular: No hyperdense vessel or unexpected calcification. Skull: Normal. Negative for fracture or focal lesion. Sinuses/Orbits: No acute finding. Other: None. CT CERVICAL SPINE FINDINGS Alignment: Straightening of normal cervical lordosis, presumably  positional. Alignment is otherwise anatomic. Skull base and vertebrae: Status post ACDF from C4-C6 with interbody grafts at C4-C5 and C5-C6. No acute fracture. No primary bone lesion or focal pathologic process. Soft tissues and spinal canal: No prevertebral fluid or swelling. No visible canal hematoma. Disc levels: Severe multilevel  degenerative disc disease, most pronounced at C3-C4. Moderate multilevel facet arthropathy. Upper chest: Negative. Other: There are no aggressive appearing lytic or blastic lesions noted in the visualized portions of the skeleton. IMPRESSION: 1. No evidence of significant acute traumatic injury to the skull, brain or cervical spine. 2. Mild cerebral atrophy with extensive chronic microvascular ischemic changes in the cerebral white matter, as above. 3. Status post ACDF at C4-C6 with multilevel degenerative disc disease and cervical spondylosis, as above. Electronically Signed   By: Vinnie Langton M.D.   On: 07/08/2019 12:30   Dg Hips Bilat W Or Wo Pelvis 5 Views Result Date: 07/08/2019 CLINICAL DATA:  Fall and pain. EXAM: DG HIP (WITH OR WITHOUT PELVIS) 5+V BILAT COMPARISON:  08/03/2018 pelvis radiographs. FINDINGS: Femoral heads are located. Sacroiliac joints are symmetric. No acute fracture. Radiation seeds in the prostate. Presumed phleboliths in the pelvis. IMPRESSION: No acute osseous abnormality. Electronically Signed   By: Abigail Miyamoto M.D.   On: 07/08/2019 13:04     Samuel Ryan was evaluated in Emergency Department on 07/08/2019 for the symptoms described in the history of present illness. He was evaluated in the context of the global COVID-19 pandemic, which necessitated consideration that the patient might be at risk for infection with the SARS-CoV-2 virus that causes COVID-19. Institutional protocols and algorithms that pertain to the evaluation of patients at risk for COVID-19 are in a state of rapid change based on information released by regulatory bodies including the CDC and federal and state organizations. These policies and algorithms were followed during the patient's care in the ED.    1505:   Pt unable to stand for orthostatic VS (pt's baseline is walking with his walker). Pt states he had syncopal episode; high risk syncope scores. Udip pending. Sign out to Dr. Lacinda Axon.        Final Clinical Impressions(s) / ED Diagnoses   Final diagnoses:  None    ED Discharge Orders    None       Francine Graven, DO 07/09/19 [phone]

## 2019-07-08 NOTE — Assessment & Plan Note (Signed)
Most recent blood sugar report has relative hypoglycemia, reduce glipizide to once daily , continue current lantus dose

## 2019-07-08 NOTE — H&P (Addendum)
History and Physical    LESEAN WOOLVERTON VOJ:[id number] DOB: [birth year] DOA: 07/08/2019  PCP: [name], MD   Patient coming from: Home  I have personally briefly reviewed patient's old medical records in [place]  Chief Complaint: Fall  HPI: MAXWEL MEADOWCROFT is a 85 y.o. male with medical history significant for diabetes mellitus, dementia, hypertension, BPH, CKD 3, atrial fibrillation, interstitial fibrosis and DVT, patient was brought in by EMS with reports of a fall earlier today.  Patient is awake and alert able to give me a little bit of history and answer simple questions.  Patient lives with his son who works and they have a Actuary who comes into the house for about 4 hours every day.  Patient was unable to get up onto his sitter got there about 4 hours later. Patient has an abrasion to the right upper side of his head, he tells me he fell 2 days ago also, sustaining that bruise.  He denies chest pain or difficulty breathing, he denies cough.  He is unsure why he fell.  He denies urinary symptoms.  ED Course: Stable vitals.  Unremarkable CBC, CMP.  Normal CK and lactic acid x2.  Unremarkable high-sensitivity troponin x2.  Head and cervical spine CT negative for acute abnormality.  Chronic extensive microvascular ischemic changes.  Two-view chest x-ray negative for acute abnormality, possible CHF versus severe acute bronchitis.  Pelvic and lumbar spine x-ray negative for acute abnormality.  UA few leukocytes rare bacteria, with possibility of UTI patient was started on p.o. ciprofloxacin.  Urine cultures obtained.  Hospitalist to admit for fall.  Review of Systems: As per HPI all other systems reviewed and negative.  Past Medical History:  Diagnosis Date   Acute cholecystitis 11/2012   Drained percutaneously   Anxiety    BPH (benign prostatic hypertrophy)    Deep venous thrombosis (HCC)    Left leg, diagnosed 7/12   Dementia ([place])    Needs supervision for safety per  son - POA   Diverticulitis    Essential hypertension    GERD (gastroesophageal reflux disease)    History of pneumonia    Prior to 2012   Incontinence of urine    Mixed hyperlipidemia    Osteoarthritis    Pulmonary embolism ([place])    Diagnosed 7/12   Type 2 diabetes mellitus ([place])     Past Surgical History:  Procedure Laterality Date   CARPAL TUNNEL RELEASE     COLONOSCOPY  08/31/2012   Procedure: COLONOSCOPY;  Surgeon: Rogene Houston, MD;  Location: [facility];  Service: Endoscopy;  Laterality: N/A;  [place] N/A 05/15/2018   Procedure: CYSTOSCOPY WITH BOTOX INJECTION;  Surgeon: Cleon Gustin, MD;  Location: [facility];  Service: Urology;  Laterality: N/A;   CYSTOSCOPY WITH INJECTION N/A 11/10/2018   Procedure: CYSTOSCOPY WITH BOTOX  INJECTION;  Surgeon: Cleon Gustin, MD;  Location: [facility];  Service: Urology;  Laterality: N/A;  [place] N/A 09/02/2016   Procedure: CYSTOSCOPY WITH INSERTION OF UROLIFT;  Surgeon: Cleon Gustin, MD;  [facility];  Service: Urology;  Laterality: N/A;   EYE SURGERY Bilateral    bilateral cataract   Right total knee replacement  04/23/2011   SPINE SURGERY  prior to 2012   Neck fusion   UMBILICAL HERNIA REPAIR     umbilical     reports that he has never  smoked. He has never used smokeless tobacco. He reports that he does not drink alcohol or use drugs.  Allergies  Allergen Reactions   Zosyn [Piperacillin Sod-Tazobactam So] Anaphylaxis, Swelling and Other (See Comments)    Facial swelling Has patient had a PCN reaction causing immediate rash, facial/tongue/throat swelling, SOB or lightheadedness with hypotension: Yes Has patient had a PCN reaction causing severe rash involving mucus membranes or skin necrosis: No Has patient had a PCN reaction that required hospitalization: No Has patient had a PCN reaction occurring within the  last 10 years: Yes If all of the above answers are "NO", then may proceed with Cephalosporin use.    Ace Inhibitors Cough   Vancomycin Swelling    Facial swelling    Family History  Problem Relation Age of Onset   Cancer Mother        Stomach   Diabetes Sister    Hypertension Sister    Diabetes Brother    Cancer Brother        Gallbladder   Seizures Son        due to meningitis as a child    Prior to Admission medications   Medication Sig Start Date End Date Taking? Authorizing Provider  acetaminophen (TYLENOL) 500 MG tablet Take 1,000 mg by mouth every 6 (six) hours as needed for mild pain, moderate pain or headache.    Yes [provider]  atenolol (TENORMIN) 25 MG tablet Take 25 mg by mouth daily.   Yes [provider]  clobetasol cream (TEMOVATE) 4.76 % APPLY 1 APPLICATION TOPICALLY 2 (TWO) TIMES DAILY. 04/13/19  Yes [name], MD  donepezil (ARICEPT) 10 MG tablet TAKE 1 TABLET AT BEDTIME 05/17/19  Yes [name], MD  Ferrous Sulfate (IRON) 325 (65 Fe) MG TABS Take 325 mg by mouth daily.    Yes [provider]  furosemide (LASIX) 20 MG tablet Take 1 tablet by mouth daily. 02/24/19  Yes [provider]  glipiZIDE (GLUCOTROL XL) 10 MG 24 hr tablet Take 1 tablet (10 mg total) by mouth daily with breakfast. 07/02/19  Yes [name], MD  Insulin Glargine (LANTUS SOLOSTAR) 100 UNIT/ML Solostar Pen Inject 23 Units into the skin every morning. HOLD for blood sugar levels below 100 Patient taking differently: Inject 20 Units into the skin every morning. HOLD for blood sugar levels below 100 05/31/19  Yes [name], MD  loratadine (CLARITIN) 10 MG tablet Take 10 mg by mouth every morning.    Yes [provider]  lovastatin (MEVACOR) 40 MG tablet TAKE 1 TABLET AT BEDTIME 02/26/19  Yes [name], MD  omeprazole (PRILOSEC) 40 MG capsule TAKE 1 CAPSULE TWICE DAILY 06/17/19  Yes Perlie Mayo, NP    sertraline (ZOLOFT) 50 MG tablet Take 1 tablet (50 mg total) by mouth daily. 01/22/19  Yes [name], MD  tamsulosin (FLOMAX) 0.4 MG CAPS capsule TAKE 1 CAPSULE EVERY DAY 06/17/19  Yes Perlie Mayo, NP  traMADol Veatrice [place]bon) 50 MG tablet Take one tablet by mouth two times daily for chronic joint pain 07/02/19  Yes [name], MD  Trospium Chloride 60 MG CP24 Take 60 mg by mouth daily.  06/02/17  Yes [provider]  Vitamin D, Ergocalciferol, (DRISDOL) 1.25 MG (50000 UT) CAPS capsule Take 1 capsule (50,000 Units total) by mouth every 7 (seven) days. 07/02/19  Yes [name], MD  Alcohol Swabs (ALCOHOL PREP) PADS Use four times daily to test  blood sugar Patient not taking: Reported on 07/08/2019 05/28/19   [name], MD  Blood Glucose Monitoring Suppl (TRUE METRIX METER) w/Device KIT With supplies, alcohol swabs as covered by insurance Patient not taking: Reported on 07/08/2019 09/22/18   [name], MD  glucose blood (TRUE METRIX BLOOD GLUCOSE TEST) test strip CHECK BLOOD SUGAR FOUR DAILY OR AS DIRECTED Patient not taking: Reported on 07/08/2019 06/27/19   [name], MD  Insulin Pen Needle (PEN NEEDLES) 31G X 5 MM MISC 1 each by Does not apply route as directed. Use to inject lantus daily dx E11.65 Patient not taking: Reported on 07/08/2019 02/14/18   [name], MD  Insulin Pen Needle 29G X 5MM MISC 1 each by Does not apply route as directed. Patient not taking: Reported on 07/08/2019 09/22/18   [name], MD  TRUEplus Lancets 33G MISC USE TO TEST TWO TIMES A DAY Patient not taking: Reported on 07/08/2019 07/02/19   [name], MD    Physical Exam: Vitals:   07/08/19 1600 07/08/19 1630 07/08/19 1700 07/08/19 1730  BP: (!) 148/85 (!) 141/84 (!) 143/97 (!) 159/98  Pulse: 81 77 77 86  Resp:      Temp:      TempSrc:      SpO2: 98% 99% 100% 90%  Weight:      Height:        Constitutional: NAD, calm, comfortable Vitals:    07/08/19 1600 07/08/19 1630 07/08/19 1700 07/08/19 1730  BP: (!) 148/85 (!) 141/84 (!) 143/97 (!) 159/98  Pulse: 81 77 77 86  Resp:      Temp:      TempSrc:      SpO2: 98% 99% 100% 90%  Weight:      Height:       Eyes: PERRL, lids and conjunctivae normal ENMT: Mucous membranes are dry. Posterior pharynx clear of any exudate or lesions. ~5 by 5 cm abrasion to top right frontal region of skull Neck: normal, supple, no masses, no thyromegaly Respiratory: clear to auscultation bilaterally, no wheezing, no crackles. Normal respiratory effort. No accessory muscle use.  Cardiovascular: Regular rate and rhythm, no murmurs / rubs / gallops. No extremity edema. 2+ pedal pulses.  Abdomen: no tenderness, no masses palpated. No hepatosplenomegaly. Bowel sounds positive.  Musculoskeletal: no clubbing / cyanosis. No joint deformity upper and lower extremities. Good ROM, no contractures. Normal muscle tone.  Skin: no rashes, lesions, ulcers. No induration Neurologic: CN 2-12 grossly intact.  Strength 5/5 in all 4.  Psychiatric: Normal judgment and insight. Alert and oriented x 2, able to tell me his name and his son's name, why he is in the hospital.  Normal mood.   Labs on Admission: I have personally reviewed following labs and imaging studies  CBC: Recent Labs  Lab 07/08/19 1039  WBC 5.8  NEUTROABS 3.5  HGB 11.3*  HCT 35.8*  MCV 100.0  PLT 973   Basic Metabolic Panel: Recent Labs  Lab 07/08/19 1039  NA 140  K 5.1  CL 105  CO2 27  GLUCOSE 123*  BUN 15  CREATININE 1.58*  CALCIUM 8.6*   Liver Function Tests: Recent Labs  Lab 07/08/19 1039  AST 25  ALT 14  ALKPHOS 65  BILITOT 1.0  PROT 7.8  ALBUMIN 3.2*   Cardiac Enzymes: Recent Labs  Lab 07/08/19 1039  CKTOTAL 59   Urine analysis:    Component Value Date/Time   COLORURINE YELLOW 07/08/2019 1512  APPEARANCEUR HAZY (A) 07/08/2019 1512   LABSPEC 1.005 07/08/2019 1512   PHURINE 6.0 07/08/2019 1512   GLUCOSEU  NEGATIVE 07/08/2019 1512   HGBUR NEGATIVE 07/08/2019 1512   BILIRUBINUR NEGATIVE 07/08/2019 1512   BILIRUBINUR neg 12/12/2017 1516   KETONESUR NEGATIVE 07/08/2019 1512   PROTEINUR NEGATIVE 07/08/2019 1512   UROBILINOGEN 1.0 12/12/2017 1516   UROBILINOGEN 0.2 06/02/2015 1310   NITRITE NEGATIVE 07/08/2019 1512   LEUKOCYTESUR SMALL (A) 07/08/2019 1512    Radiological Exams on Admission: Dg Chest 2 View  Result Date: 07/08/2019 CLINICAL DATA:  85 year old male with history of trauma from a fall. Confusion. EXAM: CHEST - 2 VIEW COMPARISON:  Chest x-ray 05/22/2019. FINDINGS: Lung volumes are low. No pneumothorax. Diffuse peribronchial cuffing. Widespread interstitial prominence throughout the lungs bilaterally, increased compared to the prior examination, particularly throughout the mid to lower lungs. Mild scarring in the periphery of the right lung base, similar to prior examination. Cephalization of the pulmonary vasculature. No definite pleural effusions. Mild cardiomegaly. The patient is rotated to the right on today's exam, resulting in distortion of the mediastinal contours and reduced diagnostic sensitivity and specificity for mediastinal pathology. Aortic atherosclerosis. Visualized bony thorax appears grossly intact. IMPRESSION: 1. No signs of significant acute traumatic injury to the thorax. 2. The appearance the chest is unusual. The possibility of congestive heart failure is considered, however, clinical correlation for signs and symptoms of severe acute bronchitis is recommended. 3. Aortic atherosclerosis. Electronically Signed   By: Vinnie Langton M.D.   On: 07/08/2019 13:05   Dg Lumbar Spine 2-3 Views  Result Date: 07/08/2019 CLINICAL DATA:  Fall.  Pain. EXAM: LUMBAR SPINE - 2-3 VIEW COMPARISON:  None. FINDINGS: AP and lateral views. Five lumbar type vertebral bodies. Diminutive twelfth ribs. Sacroiliac joints are symmetric. Radiation seeds in the prostate. Straightening of expected  lordosis. Maintenance of vertebral body height. Loss of intervertebral disc height at L3-4 and L4-5. Aortic atherosclerosis. Facet arthropathy at L4-5 and L5-S1. IMPRESSION: Spondylosis, without acute osseous abnormality. Aortic Atherosclerosis (ICD10-I70.0). Electronically Signed   By: Abigail Miyamoto M.D.   On: 07/08/2019 13:06   Ct Head Wo Contrast  Result Date: 07/08/2019 CLINICAL DATA:  85 year old male with history of head trauma from a fall yesterday. EXAM: CT HEAD WITHOUT CONTRAST CT CERVICAL SPINE WITHOUT CONTRAST TECHNIQUE: Multidetector CT imaging of the head and cervical spine was performed following the standard protocol without intravenous contrast. Multiplanar CT image reconstructions of the cervical spine were also generated. COMPARISON:  Head CT 05/22/2019.  Cervical spine CT 08/03/2018. FINDINGS: CT HEAD FINDINGS Brain: Cerebral atrophy. Patchy and confluent areas of decreased attenuation are noted throughout the deep and periventricular white matter of the cerebral hemispheres bilaterally, compatible with chronic microvascular ischemic disease. Physiologic calcifications in the basal ganglia bilaterally incidentally noted. No evidence of acute infarction, hemorrhage, hydrocephalus, extra-axial collection or mass lesion/mass effect. Vascular: No hyperdense vessel or unexpected calcification. Skull: Normal. Negative for fracture or focal lesion. Sinuses/Orbits: No acute finding. Other: None. CT CERVICAL SPINE FINDINGS Alignment: Straightening of normal cervical lordosis, presumably positional. Alignment is otherwise anatomic. Skull base and vertebrae: Status post ACDF from C4-C6 with interbody grafts at C4-C5 and C5-C6. No acute fracture. No primary bone lesion or focal pathologic process. Soft tissues and spinal canal: No prevertebral fluid or swelling. No visible canal hematoma. Disc levels: Severe multilevel degenerative disc disease, most pronounced at C3-C4. Moderate multilevel facet  arthropathy. Upper chest: Negative. Other: There are no aggressive appearing lytic or blastic lesions noted in  the visualized portions of the skeleton. IMPRESSION: 1. No evidence of significant acute traumatic injury to the skull, brain or cervical spine. 2. Mild cerebral atrophy with extensive chronic microvascular ischemic changes in the cerebral white matter, as above. 3. Status post ACDF at C4-C6 with multilevel degenerative disc disease and cervical spondylosis, as above. Electronically Signed   By: Vinnie Langton M.D.   On: 07/08/2019 12:30   Ct Cervical Spine Wo Contrast  Result Date: 07/08/2019 CLINICAL DATA:  85 year old male with history of head trauma from a fall yesterday. EXAM: CT HEAD WITHOUT CONTRAST CT CERVICAL SPINE WITHOUT CONTRAST TECHNIQUE: Multidetector CT imaging of the head and cervical spine was performed following the standard protocol without intravenous contrast. Multiplanar CT image reconstructions of the cervical spine were also generated. COMPARISON:  Head CT 05/22/2019.  Cervical spine CT 08/03/2018. FINDINGS: CT HEAD FINDINGS Brain: Cerebral atrophy. Patchy and confluent areas of decreased attenuation are noted throughout the deep and periventricular white matter of the cerebral hemispheres bilaterally, compatible with chronic microvascular ischemic disease. Physiologic calcifications in the basal ganglia bilaterally incidentally noted. No evidence of acute infarction, hemorrhage, hydrocephalus, extra-axial collection or mass lesion/mass effect. Vascular: No hyperdense vessel or unexpected calcification. Skull: Normal. Negative for fracture or focal lesion. Sinuses/Orbits: No acute finding. Other: None. CT CERVICAL SPINE FINDINGS Alignment: Straightening of normal cervical lordosis, presumably positional. Alignment is otherwise anatomic. Skull base and vertebrae: Status post ACDF from C4-C6 with interbody grafts at C4-C5 and C5-C6. No acute fracture. No primary bone lesion or  focal pathologic process. Soft tissues and spinal canal: No prevertebral fluid or swelling. No visible canal hematoma. Disc levels: Severe multilevel degenerative disc disease, most pronounced at C3-C4. Moderate multilevel facet arthropathy. Upper chest: Negative. Other: There are no aggressive appearing lytic or blastic lesions noted in the visualized portions of the skeleton. IMPRESSION: 1. No evidence of significant acute traumatic injury to the skull, brain or cervical spine. 2. Mild cerebral atrophy with extensive chronic microvascular ischemic changes in the cerebral white matter, as above. 3. Status post ACDF at C4-C6 with multilevel degenerative disc disease and cervical spondylosis, as above. Electronically Signed   By: Vinnie Langton M.D.   On: 07/08/2019 12:30   Dg Hips Bilat W Or Wo Pelvis 5 Views  Result Date: 07/08/2019 CLINICAL DATA:  Fall and pain. EXAM: DG HIP (WITH OR WITHOUT PELVIS) 5+V BILAT COMPARISON:  08/03/2018 pelvis radiographs. FINDINGS: Femoral heads are located. Sacroiliac joints are symmetric. No acute fracture. Radiation seeds in the prostate. Presumed phleboliths in the pelvis. IMPRESSION: No acute osseous abnormality. Electronically Signed   By: Abigail Miyamoto M.D.   On: 07/08/2019 13:04    EKG: Independently reviewed.  Rate 94, QRS 147.  Old right bundle branch block.  P waves not evident on EKG but present on telemetry monitoring in the ED.  Assessment/Plan Active Problems:   Falls  Falls- appears to have multiple falls.  Found on the floor for 4 hours before sitter arrived.  CK, creatinine, lactic acid within normal limits.  Abrasion to forehead.  Both x-rays hip lumbar chest head and cervical CT negative for acute abnormality.  Falls likely due to advanced age, dementia and increasing frailty.  Patient also appears dehydrated.  UA shows small leukocytes, rare bacteria-not convincing for UTI., and he denies urinary symptoms.  Ciprofloxacin x 1 given in ED ( Penicillin  allergy). - R/L 100cc/hr x 15 hrs - PT evaluation - F/u Urine cultures ordered in ED  Dementia-stable. -Resume home donepezil,  sertraline  CKD 3-stable.  Creatinine 1.58, about baseline. - Gentle hydration,  DM 2-random glucose 123. -Resume home Lantus at reduced dose 10 units in a.m., home glipizide in a.m  Hypertension, BPH -stable. -Resume home tamsulosin -Hold 42m Lasix for now  Atrial fibrillation-appears to be in sinus rhythm on ED telemetry, EKG shows regular rhythm but P waves not evident.  Not on anticoagulation likely secondary to falls. -Resume home atenolol.   Interstitial fibrosis-appears stable. patient on room air.  DVT prophylaxis: Lovenox Code Status: Full Family Communication: None at bedside Disposition Plan: 1-2 days.  May benefit from placement. Consults called: None Admission status: Obs, tele   EBethena RoysMD [facility]  07/08/2019, 8:25 PM

## 2019-07-08 NOTE — Assessment & Plan Note (Signed)
Currently stable.

## 2019-07-08 NOTE — ED Notes (Signed)
Abrasion noted to top of head

## 2019-07-08 NOTE — Assessment & Plan Note (Signed)
Controlled, no change in medication  

## 2019-07-08 NOTE — Progress Notes (Signed)
   Samuel Ryan     MRN: [id number]      DOB: [birth year]   HPI Samuel Ryan is here for follow up and re-evaluation of chronic medical conditions, His son who cares for him reports that he notes increased fatigue with poor appetite and excessive sleepiness.  He was recently seen by urology and urine culture negative for urinary tract infection.  Blood sugars are falling low at times, and with a poor appetite med adjustment is made  There is no recent history of fever chills or productive cough.  He has had no recent falls.  His  son continues to be concerned about generalized weakness he has tried getting physical therapy to help with his father at home but this is does not seem to be working out as planned to date, we will look into this [place] Denies recent fever or chills. Denies sinus pressure, nasal congestion, ear pain or sore throat. Chronic  chest congestion,no  productive cough or wheezing. Denies chest pains, palpitations and leg swelling Denies abdominal pain, nausea, vomiting,diarrhea or constipation.  Poor appetitie Denies dysuria, frequency, hesitancy or incontinence. Chronic joint pain, swelling and limitation in mobility. Denies headaches, seizures, numbness, or tingling. Denies depression, anxiety or insomnia. Denies skin break down or rash.   PE  BP 112/78   Pulse (!) 110   Temp 98.1 F (36.7 C) (Temporal)   Resp 16   Ht 5\' 11"  (1.803 m)   SpO2 90%   BMI 35.67 kg/m   Patient alert and in mild cardiopulmonary distress.More interactive and less agitated than at the previous visit  HEENT: No facial asymmetry, EOMI,   .  Neck decreased ROM,  no JVD, no mass.  Chest: Clear to auscultation bilaterally.  CVS: S1, S2 systolic  murmur, no S3.Regular rate.  ABD: Soft non tender.   Ext: No edema  MS: decreased  ROM spine, shoulders, hips and knees.  Skin: Intact, no ulcerations or rash noted.  Psych: Good eye contact, normal affect. Memory impaired not  anxious or depressed appearing.  CNS: CN 2-12 intact, power,  normal throughout.no focal deficits noted.   Assessment & Plan  Uncontrolled type 2 diabetes mellitus with hyperglycemia ([place]) Most recent blood sugar report has relative hypoglycemia, reduce glipizide to once daily , continue current lantus dose  Essential hypertension Controlled, no change in medication   At high risk for falls Need to f/u with in home PT as pt remains at high fall risk due to his chronic medical conditions, home safety reviewed with his son  Dementia without behavioral disturbance ([place]) Currently stable

## 2019-07-09 DIAGNOSIS — N400 Enlarged prostate without lower urinary tract symptoms: Secondary | ICD-10-CM | POA: Diagnosis present

## 2019-07-09 DIAGNOSIS — Z20828 Contact with and (suspected) exposure to other viral communicable diseases: Secondary | ICD-10-CM | POA: Diagnosis present

## 2019-07-09 DIAGNOSIS — Z8249 Family history of ischemic heart disease and other diseases of the circulatory system: Secondary | ICD-10-CM | POA: Diagnosis not present

## 2019-07-09 DIAGNOSIS — E860 Dehydration: Secondary | ICD-10-CM

## 2019-07-09 DIAGNOSIS — Z9181 History of falling: Secondary | ICD-10-CM | POA: Diagnosis not present

## 2019-07-09 DIAGNOSIS — W19XXXD Unspecified fall, subsequent encounter: Secondary | ICD-10-CM | POA: Diagnosis not present

## 2019-07-09 DIAGNOSIS — R296 Repeated falls: Secondary | ICD-10-CM | POA: Diagnosis present

## 2019-07-09 DIAGNOSIS — I129 Hypertensive chronic kidney disease with stage 1 through stage 4 chronic kidney disease, or unspecified chronic kidney disease: Secondary | ICD-10-CM | POA: Diagnosis present

## 2019-07-09 DIAGNOSIS — E1122 Type 2 diabetes mellitus with diabetic chronic kidney disease: Secondary | ICD-10-CM | POA: Diagnosis present

## 2019-07-09 DIAGNOSIS — Z86711 Personal history of pulmonary embolism: Secondary | ICD-10-CM | POA: Diagnosis not present

## 2019-07-09 DIAGNOSIS — S0081XA Abrasion of other part of head, initial encounter: Secondary | ICD-10-CM | POA: Diagnosis present

## 2019-07-09 DIAGNOSIS — Z888 Allergy status to other drugs, medicaments and biological substances status: Secondary | ICD-10-CM | POA: Diagnosis not present

## 2019-07-09 DIAGNOSIS — F0390 Unspecified dementia without behavioral disturbance: Secondary | ICD-10-CM | POA: Diagnosis present

## 2019-07-09 DIAGNOSIS — I480 Paroxysmal atrial fibrillation: Secondary | ICD-10-CM | POA: Diagnosis present

## 2019-07-09 DIAGNOSIS — N183 Chronic kidney disease, stage 3 (moderate): Secondary | ICD-10-CM | POA: Diagnosis present

## 2019-07-09 DIAGNOSIS — Z794 Long term (current) use of insulin: Secondary | ICD-10-CM | POA: Diagnosis not present

## 2019-07-09 DIAGNOSIS — Z79899 Other long term (current) drug therapy: Secondary | ICD-10-CM | POA: Diagnosis not present

## 2019-07-09 DIAGNOSIS — Y92002 Bathroom of unspecified non-institutional (private) residence single-family (private) house as the place of occurrence of the external cause: Secondary | ICD-10-CM | POA: Diagnosis not present

## 2019-07-09 DIAGNOSIS — I10 Essential (primary) hypertension: Secondary | ICD-10-CM | POA: Diagnosis not present

## 2019-07-09 DIAGNOSIS — Z833 Family history of diabetes mellitus: Secondary | ICD-10-CM | POA: Diagnosis not present

## 2019-07-09 DIAGNOSIS — Z96651 Presence of right artificial knee joint: Secondary | ICD-10-CM | POA: Diagnosis present

## 2019-07-09 DIAGNOSIS — E782 Mixed hyperlipidemia: Secondary | ICD-10-CM | POA: Diagnosis present

## 2019-07-09 DIAGNOSIS — K219 Gastro-esophageal reflux disease without esophagitis: Secondary | ICD-10-CM | POA: Diagnosis present

## 2019-07-09 DIAGNOSIS — R55 Syncope and collapse: Secondary | ICD-10-CM | POA: Diagnosis present

## 2019-07-09 DIAGNOSIS — W1839XA Other fall on same level, initial encounter: Secondary | ICD-10-CM | POA: Diagnosis present

## 2019-07-09 DIAGNOSIS — R29810 Facial weakness: Secondary | ICD-10-CM | POA: Diagnosis present

## 2019-07-09 DIAGNOSIS — F419 Anxiety disorder, unspecified: Secondary | ICD-10-CM | POA: Diagnosis present

## 2019-07-09 LAB — CBC
HCT: 38.1 % — ABNORMAL LOW (ref 39.0–52.0)
Hemoglobin: 12 g/dL — ABNORMAL LOW (ref 13.0–17.0)
MCH: 31.4 pg (ref 26.0–34.0)
MCHC: 31.5 g/dL (ref 30.0–36.0)
MCV: 99.7 fL (ref 80.0–100.0)
Platelets: 196 10*3/uL (ref 150–400)
RBC: 3.82 MIL/uL — ABNORMAL LOW (ref 4.22–5.81)
RDW: 14.3 % (ref 11.5–15.5)
WBC: 5.5 10*3/uL (ref 4.0–10.5)
nRBC: 0 % (ref 0.0–0.2)

## 2019-07-09 LAB — BASIC METABOLIC PANEL
Anion gap: 9 (ref 5–15)
BUN: 15 mg/dL (ref 8–23)
CO2: 25 mmol/L (ref 22–32)
Calcium: 8.5 mg/dL — ABNORMAL LOW (ref 8.9–10.3)
Chloride: 105 mmol/L (ref 98–111)
Creatinine, Ser: 1.55 mg/dL — ABNORMAL HIGH (ref 0.61–1.24)
GFR calc Af Amer: 46 mL/min — ABNORMAL LOW (ref >60)
GFR calc non Af Amer: 40 mL/min — ABNORMAL LOW (ref >60)
Glucose, Bld: 166 mg/dL — ABNORMAL HIGH (ref 70–99)
Potassium: 4.9 mmol/L (ref 3.5–5.1)
Sodium: 139 mmol/L (ref 135–145)

## 2019-07-09 LAB — URINE CULTURE: Culture: NO GROWTH

## 2019-07-09 LAB — GLUCOSE, CAPILLARY
Glucose-Capillary: 157 mg/dL — ABNORMAL HIGH (ref 70–99)
Glucose-Capillary: 127 mg/dL — ABNORMAL HIGH (ref 70–99)
Glucose-Capillary: 102 mg/dL — ABNORMAL HIGH (ref 70–99)
Glucose-Capillary: 113 mg/dL — ABNORMAL HIGH (ref 70–99)

## 2019-07-09 LAB — SARS CORONAVIRUS 2 (TAT 6-24 HRS): SARS Coronavirus 2: NEGATIVE

## 2019-07-09 NOTE — Progress Notes (Signed)
PROGRESS NOTE    Samuel Ryan  AQT:[id number]  DOB: Sep 12, 1931  DOA: 07/08/2019 PCP: [name], MD   Brief Admission Hx: 85 year old gentleman with multiple medical problems including dementia, hypertension, CKD, atrial fibrillation and interstitial fibrosis presented to the ED with frequent falls and found down on the floor at home for several hours.  MDM/Assessment & Plan:   1. Frequent falls-suspect this is multifactorial situation secondary to his advanced age and dementia and I have asked for PT evaluation.  If he qualifies for SNF will work with social work to place him.  Fall precautions recommended.  Gentle hydration was given overnight.  Urine culture pending. 2. Dementia-patient has been maintained on donezepil and sertraline. 3. Stage III CKD-stable. 4. Type 2 diabetes mellitus- I think that his glipizide should be discontinued as if he is having hypoglycemia at home this could cause him to fall.  His Lantus has been restarted and we will follow his blood sugars closely.  Would not restart glipizide at discharge. 5. Essential hypertension-he has been resumed on his home tamsulosin.  Lasix temporarily held in the setting of dehydration. 6. Paroxysmal atrial fibrillation- he has been restarted on home atenolol.  He is not anticoagulated due to frequent falling. 7. Abnormal urinalysis- urine culture pending.  DVT prophylaxis: Lovenox Code Status: Full Family Communication: Spoke with his grandson telephone Disposition Plan: Awaiting PT evaluation   Consultants:  PT  Procedures:    Antimicrobials:     Subjective: Patient ate breakfast.  He has no specific complaints.  He has not gotten out of bed since admission.  Objective: Vitals:   07/08/19 2014 07/08/19 2121 07/09/19 0535 07/09/19 0953  BP: (!) 151/81 (!) 145/111 (!) 153/89 123/89  Pulse: 89 62 95 97  Resp:  20 19 20   Temp:  98.4 F (36.9 C) 98 F (36.7 C)   TempSrc:  Axillary Oral   SpO2: 91%  93% 93% 93%  Weight:  112.1 kg    Height:  5\' 9"  (1.753 m)      Intake/Output Summary (Last 24 hours) at 07/09/2019 1206 Last data filed at 07/09/2019 0500 Gross per 24 hour  Intake 355.43 ml  Output 700 ml  Net -344.57 ml   Filed Weights   07/08/19 1011 07/08/19 2121  Weight: 116 kg 112.1 kg     REVIEW OF SYSTEMS  As per history otherwise all reviewed and reported negative  Exam:  General exam: Elderly male lying in the bed he is chronically ill-appearing but he is pleasant and cooperative. Respiratory system: Clear. No increased work of breathing. Cardiovascular system: S1 & S2 heard. No JVD, murmurs, gallops, clicks or pedal edema. Gastrointestinal system: Abdomen is nondistended, soft and nontender. Normal bowel sounds heard. Central nervous system: Alert and oriented to person only. No focal neurological deficits. Extremities: no cyanosis or clubbing.  Data Reviewed: Basic Metabolic Panel: Recent Labs  Lab 07/08/19 1039 07/09/19 0915  NA 140 139  K 5.1 4.9  CL 105 105  CO2 27 25  GLUCOSE 123* 166*  BUN 15 15  CREATININE 1.58* 1.55*  CALCIUM 8.6* 8.5*   Liver Function Tests: Recent Labs  Lab 07/08/19 1039  AST 25  ALT 14  ALKPHOS 65  BILITOT 1.0  PROT 7.8  ALBUMIN 3.2*   No results for input(s): LIPASE, AMYLASE in the last 168 hours. No results for input(s): AMMONIA in the last 168 hours. CBC: Recent Labs  Lab 07/08/19 1039 07/09/19 0915  WBC 5.8 5.5  NEUTROABS 3.5  --   HGB 11.3* 12.0*  HCT 35.8* 38.1*  MCV 100.0 99.7  PLT 192 196   Cardiac Enzymes: Recent Labs  Lab 07/08/19 1039  CKTOTAL 59   CBG (last 3)  Recent Labs    07/08/19 2107 07/09/19 0758  GLUCAP 167* 157*   Recent Results (from the past 240 hour(s))  Culture, Urine     Status: None   Collection Time: 06/29/19  3:00 PM   Specimen: Urine, Clean Catch  Result Value Ref Range Status   Specimen Description   Final    URINE, CLEAN CATCH Performed at [facility], [address] [place], [place] [address]    Special Requests   Final    NONE Performed at [facility], [address] [place], [place] [address]    Culture   Final    NO GROWTH Performed at [facility], [place] [address] [place], [place] [address]    Report Status 06/30/2019 FINAL  Final  SARS CORONAVIRUS 2 Nasal Swab Aptima Multi Swab     Status: None   Collection Time: 07/08/19  2:51 PM   Specimen: Aptima Multi Swab; Nasal Swab  Result Value Ref Range Status   SARS Coronavirus 2 NEGATIVE NEGATIVE Final    Comment: (NOTE) SARS-CoV-2 target nucleic acids are NOT DETECTED. The SARS-CoV-2 RNA is generally detectable in upper and lower respiratory specimens during the acute phase of infection. Negative results do not preclude SARS-CoV-2 infection, do not rule out co-infections with other pathogens, and should not be used as the sole basis for treatment or other patient management decisions. Negative results must be combined with clinical observations, patient history, and epidemiological information. The expected result is Negative. Fact Sheet for Patients: SugarRoll.be Fact Sheet for Healthcare Providers: https://www.woods-mathews.com/ This test is not yet approved or cleared by the [place] FDA and  has been authorized for detection and/or diagnosis of SARS-CoV-2 by FDA under an Emergency Use Authorization (EUA). This EUA will remain  in effect (meaning this test can be used) for the duration of the COVID-19 declaration under Section 56 4(b)(1) of the Act, 21 U.S.C. section 360bbb-3(b)(1), unless the authorization is terminated or revoked sooner. Performed at [facility], [place] [address] [place], [place] [address]      Studies: Dg Chest 2 View  Result Date: 07/08/2019 CLINICAL DATA:  85 year old male with history of trauma from a fall. Confusion. EXAM: CHEST - 2 VIEW COMPARISON:  Chest x-ray 05/22/2019.  FINDINGS: Lung volumes are low. No pneumothorax. Diffuse peribronchial cuffing. Widespread interstitial prominence throughout the lungs bilaterally, increased compared to the prior examination, particularly throughout the mid to lower lungs. Mild scarring in the periphery of the right lung base, similar to prior examination. Cephalization of the pulmonary vasculature. No definite pleural effusions. Mild cardiomegaly. The patient is rotated to the right on today's exam, resulting in distortion of the mediastinal contours and reduced diagnostic sensitivity and specificity for mediastinal pathology. Aortic atherosclerosis. Visualized bony thorax appears grossly intact. IMPRESSION: 1. No signs of significant acute traumatic injury to the thorax. 2. The appearance the chest is unusual. The possibility of congestive heart failure is considered, however, clinical correlation for signs and symptoms of severe acute bronchitis is recommended. 3. Aortic atherosclerosis. Electronically Signed   By: Vinnie Langton M.D.   On: 07/08/2019 13:05   Dg Lumbar Spine 2-3 Views  Result Date: 07/08/2019 CLINICAL DATA:  Fall.  Pain. EXAM: LUMBAR SPINE - 2-3 VIEW COMPARISON:  None. FINDINGS: AP and lateral views. Five lumbar type vertebral bodies. Diminutive twelfth ribs. Sacroiliac joints are symmetric. Radiation seeds in the prostate. Straightening of expected lordosis. Maintenance of vertebral body height. Loss of intervertebral disc height at L3-4 and L4-5. Aortic atherosclerosis. Facet arthropathy at L4-5 and L5-S1. IMPRESSION: Spondylosis, without acute osseous abnormality. Aortic Atherosclerosis (ICD10-I70.0). Electronically Signed   By: Abigail Miyamoto M.D.   On: 07/08/2019 13:06   Ct Head Wo Contrast  Result Date: 07/08/2019 CLINICAL DATA:  85 year old male with history of head trauma from a fall yesterday. EXAM: CT HEAD WITHOUT CONTRAST CT CERVICAL SPINE WITHOUT CONTRAST TECHNIQUE: Multidetector CT imaging of the head and  cervical spine was performed following the standard protocol without intravenous contrast. Multiplanar CT image reconstructions of the cervical spine were also generated. COMPARISON:  Head CT 05/22/2019.  Cervical spine CT 08/03/2018. FINDINGS: CT HEAD FINDINGS Brain: Cerebral atrophy. Patchy and confluent areas of decreased attenuation are noted throughout the deep and periventricular white matter of the cerebral hemispheres bilaterally, compatible with chronic microvascular ischemic disease. Physiologic calcifications in the basal ganglia bilaterally incidentally noted. No evidence of acute infarction, hemorrhage, hydrocephalus, extra-axial collection or mass lesion/mass effect. Vascular: No hyperdense vessel or unexpected calcification. Skull: Normal. Negative for fracture or focal lesion. Sinuses/Orbits: No acute finding. Other: None. CT CERVICAL SPINE FINDINGS Alignment: Straightening of normal cervical lordosis, presumably positional. Alignment is otherwise anatomic. Skull base and vertebrae: Status post ACDF from C4-C6 with interbody grafts at C4-C5 and C5-C6. No acute fracture. No primary bone lesion or focal pathologic process. Soft tissues and spinal canal: No prevertebral fluid or swelling. No visible canal hematoma. Disc levels: Severe multilevel degenerative disc disease, most pronounced at C3-C4. Moderate multilevel facet arthropathy. Upper chest: Negative. Other: There are no aggressive appearing lytic or blastic lesions noted in the visualized portions of the skeleton. IMPRESSION: 1. No evidence of significant acute traumatic injury to the skull, brain or cervical spine. 2. Mild cerebral atrophy with extensive chronic microvascular ischemic changes in the cerebral white matter, as above. 3. Status post ACDF at C4-C6 with multilevel degenerative disc disease and cervical spondylosis, as above. Electronically Signed   By: Vinnie Langton M.D.   On: 07/08/2019 12:30   Ct Cervical Spine Wo  Contrast  Result Date: 07/08/2019 CLINICAL DATA:  85 year old male with history of head trauma from a fall yesterday. EXAM: CT HEAD WITHOUT CONTRAST CT CERVICAL SPINE WITHOUT CONTRAST TECHNIQUE: Multidetector CT imaging of the head and cervical spine was performed following the standard protocol without intravenous contrast. Multiplanar CT image reconstructions of the cervical spine were also generated. COMPARISON:  Head CT 05/22/2019.  Cervical spine CT 08/03/2018. FINDINGS: CT HEAD FINDINGS Brain: Cerebral atrophy. Patchy and confluent areas of decreased attenuation are noted throughout the deep and periventricular white matter of the cerebral hemispheres bilaterally, compatible with chronic microvascular ischemic disease. Physiologic calcifications in the basal ganglia bilaterally incidentally noted. No evidence of acute infarction, hemorrhage, hydrocephalus, extra-axial collection or mass lesion/mass effect. Vascular: No hyperdense vessel or unexpected calcification. Skull: Normal. Negative for fracture or focal lesion. Sinuses/Orbits: No acute finding. Other: None. CT CERVICAL SPINE FINDINGS Alignment: Straightening of normal cervical lordosis, presumably positional. Alignment is otherwise anatomic. Skull base and vertebrae: Status post ACDF from C4-C6 with interbody grafts at C4-C5 and C5-C6. No acute fracture. No primary bone lesion or focal pathologic process. Soft tissues and spinal canal: No prevertebral fluid or swelling. No visible canal hematoma. Disc levels: Severe multilevel degenerative disc disease, most pronounced at  C3-C4. Moderate multilevel facet arthropathy. Upper chest: Negative. Other: There are no aggressive appearing lytic or blastic lesions noted in the visualized portions of the skeleton. IMPRESSION: 1. No evidence of significant acute traumatic injury to the skull, brain or cervical spine. 2. Mild cerebral atrophy with extensive chronic microvascular ischemic changes in the cerebral  white matter, as above. 3. Status post ACDF at C4-C6 with multilevel degenerative disc disease and cervical spondylosis, as above. Electronically Signed   By: Vinnie Langton M.D.   On: 07/08/2019 12:30   Dg Hips Bilat W Or Wo Pelvis 5 Views  Result Date: 07/08/2019 CLINICAL DATA:  Fall and pain. EXAM: DG HIP (WITH OR WITHOUT PELVIS) 5+V BILAT COMPARISON:  08/03/2018 pelvis radiographs. FINDINGS: Femoral heads are located. Sacroiliac joints are symmetric. No acute fracture. Radiation seeds in the prostate. Presumed phleboliths in the pelvis. IMPRESSION: No acute osseous abnormality. Electronically Signed   By: Abigail Miyamoto M.D.   On: 07/08/2019 13:04     Scheduled Meds:  atenolol  25 mg Oral Daily   donepezil  10 mg Oral QHS   enoxaparin (LOVENOX) injection  60 mg Subcutaneous Q24H   glipiZIDE  10 mg Oral Q breakfast   insulin aspart  0-9 Units Subcutaneous TID WC   insulin glargine  10 Units Subcutaneous q morning - 10a   pantoprazole  40 mg Oral Daily   sertraline  50 mg Oral Daily   tamsulosin  0.4 mg Oral Daily   Continuous Infusions:  Active Problems:   Falls   Time spent:   [name] Brakeman, MD [facility] 07/09/2019, 12:06 PM    LOS: 0 days  How to contact the [facility] Attending or Consulting provider [place] or covering provider during after hours [place], for this patient?  1. Check the care team in [facility] and look for a) attending/consulting [facility] provider listed and b) the [facility] team listed 2. Log into [email or web address] and use [place]'s universal password to access. If you do not have the password, please contact the hospital operator. 3. Locate the [facility] provider you are looking for under [facility] and page to a number that you can be directly reached. 4. If you still have difficulty reaching the provider, please page the [facility] (Director on Call) for the Hospitalists listed on amion for assistance.

## 2019-07-09 NOTE — NC FL2 (Signed)
[place] LEVEL OF CARE SCREENING TOOL     IDENTIFICATION  Patient Name: Samuel Ryan Birthdate: [birth year] Sex: male Admission Date (Current Location): 07/08/2019  [facility] and [place] Number:  [employer] and Address:  [place] [address], [place]      Provider Number: [id number]  Attending Physician Name and Address:  Murlean Iba, MD  Relative Name and Phone Number:  Imir Brumbach    [phone]    Current Level of Care: Hospital Recommended Level of Care: [place] Prior Approval Number:    Date Approved/Denied:   PASRR Number: [id number]  Discharge Plan: SNF    Current Diagnoses: Patient Active Problem List   Diagnosis Date Noted  . At risk for falling 07/09/2019  . Falls 07/08/2019  . Bilateral hand pain 11/28/2018  . Knee pain, left 10/10/2018  . History of total knee arthroplasty, right 10/10/2018  . Unilateral primary osteoarthritis, left knee 10/10/2018  . Paroxysmal SVT (supraventricular tachycardia) ([place]) 09/25/2018  . Uncontrolled type 2 diabetes mellitus with hyperglycemia ([place]) 09/22/2018  . Dementia without behavioral disturbance ([place]) 09/22/2018  . Fall 08/10/2018  . Morbid obesity ([place]) 06/12/2018  . Anemia 09/14/2017  . CKD (chronic kidney disease) stage 3, GFR 30-59 ml/min (HCC) 09/14/2017  . Atrial fibrillation ([place]) 09/14/2017  . Hypoalbuminemia 11/23/2016  . Hypoxia 10/29/2016  . Urinary frequency 09/17/2015  . At high risk for falls 03/30/2015  . Generalized osteoarthritis 07/16/2014  . Urinary incontinence 07/16/2014  . Pulmonary interstitial fibrosis ([place]) 07/03/2014  . Abnormal CXR (chest x-ray) 05/27/2014  . Thyroid mass of unclear etiology 05/07/2014  . Vitamin D deficiency 01/31/2014  . Allergic rhinitis 01/31/2014  . Diverticulosis of sigmoid colon 12/13/2012  . Right ventricular dysfunction 07/20/2011  . Heart disease, unspecified 07/20/2011  .  Alzheimer's dementia without behavioral disturbance ([place]) 09/12/2010  . Goiter 06/19/2009  . Essential hypertension 12/11/2008  . Uncontrolled diabetes mellitus with diabetic nephropathy, with long-term current use of insulin ([place]) 09/05/2008  . Hyperlipidemia with target LDL less than 100 07/19/2008  . BPH (benign prostatic hyperplasia) 03/28/2008    Orientation RESPIRATION BLADDER Height & Weight     Self    Incontinent Weight: 112.1 kg Height:  5\' 9"  (175.3 cm)  BEHAVIORAL SYMPTOMS/MOOD NEUROLOGICAL BOWEL NUTRITION STATUS      Continent Diet(Heart Healthy/ Carb modified)  AMBULATORY STATUS COMMUNICATION OF NEEDS Skin   Extensive Assist Verbally Other (Comment)(Abrasion to Head)                       Personal Care Assistance Level of Assistance  Bathing, Feeding, Dressing Bathing Assistance: Maximum assistance Feeding assistance: Limited assistance Dressing Assistance: Limited assistance     Functional Limitations Info  Sight, Hearing, Speech Sight Info: Adequate Hearing Info: Adequate Speech Info: Adequate    SPECIAL CARE FACTORS FREQUENCY  PT (By licensed PT)     PT Frequency: PT  5 times a week              Contractures Contractures Info: Not present    Additional Factors Info  Code Status, Allergies Code Status Info: Full Allergies Info: zosyn, Vancomycin, ace inhibitors           Current Medications (07/09/2019):  This is the current hospital active medication list Current Facility-Administered Medications  Medication Dose Route Frequency Provider Last Rate Last Dose  . acetaminophen (TYLENOL) tablet 650 mg  650 mg Oral Q6H PRN Emokpae,  Ejiroghene E, MD   650 mg at 07/08/19 2118   Or  . acetaminophen (TYLENOL) suppository 650 mg  650 mg Rectal Q6H PRN Emokpae, Ejiroghene E, MD      . atenolol (TENORMIN) tablet 25 mg  25 mg Oral Daily Emokpae, Ejiroghene E, MD   25 mg at 07/08/19 2118  . donepezil (ARICEPT) tablet 10 mg  10 mg Oral QHS Emokpae,  Ejiroghene E, MD   10 mg at 07/08/19 2118  . enoxaparin (LOVENOX) injection 60 mg  60 mg Subcutaneous Q24H Emokpae, Ejiroghene E, MD   60 mg at 07/08/19 2355  . glipiZIDE (GLUCOTROL XL) 24 hr tablet 10 mg  10 mg Oral Q breakfast Emokpae, Ejiroghene E, MD   10 mg at 07/09/19 0815  . insulin aspart (novoLOG) injection 0-9 Units  0-9 Units Subcutaneous TID WC Emokpae, Ejiroghene E, MD   1 Units at 07/09/19 1217  . insulin glargine (LANTUS) injection 10 Units  10 Units Subcutaneous q morning - 10a Emokpae, Ejiroghene E, MD      . ondansetron (ZOFRAN) tablet 4 mg  4 mg Oral Q6H PRN Emokpae, Ejiroghene E, MD       Or  . ondansetron (ZOFRAN) injection 4 mg  4 mg Intravenous Q6H PRN Emokpae, Ejiroghene E, MD      . pantoprazole (PROTONIX) EC tablet 40 mg  40 mg Oral Daily Emokpae, Ejiroghene E, MD   40 mg at 07/08/19 2118  . sertraline (ZOLOFT) tablet 50 mg  50 mg Oral Daily Emokpae, Ejiroghene E, MD   50 mg at 07/09/19 0815  . tamsulosin (FLOMAX) capsule 0.4 mg  0.4 mg Oral Daily Emokpae, Ejiroghene E, MD   0.4 mg at 07/08/19 2118   Facility-Administered Medications Ordered in Other Encounters  Medication Dose Route Frequency Provider Last Rate Last Dose  . botulinum toxin Type A (BOTOX) injection 100 Units  100 Units Intramuscular Once McKenzie, [name], MD         Discharge Medications: Please see discharge summary for a list of discharge medications.  Relevant Imaging Results:  Relevant Lab Results:   Additional Information SS# [id number]  Boneta Lucks, RN

## 2019-07-09 NOTE — Progress Notes (Signed)
Spoke with pt's grandson. States he would like Mr. Samuel Ryan to come home with Home PT and home services. He stated the patient had been to short term rehab in the past and it was not very helpful for the patient.

## 2019-07-09 NOTE — Evaluation (Signed)
Physical Therapy Evaluation Patient Details Name: Samuel Ryan MRN: [id number] DOB: 01-07-1931 Today's Date: 07/09/2019   History of Present Illness  Samuel Ryan is a 85 y.o. male with medical history significant for diabetes mellitus, dementia, hypertension, BPH, CKD 3, atrial fibrillation, interstitial fibrosis and DVT, patient was brought in by EMS with reports of a fall earlier today.  Patient is awake and alert able to give me a little bit of history and answer simple questions.  Patient lives with his son who works and they have a Actuary who comes into the house for about 4 hours every day.  Patient was unable to get up onto his sitter got there about 4 hours later.Patient has an abrasion to the right upper side of his head, he tells me he fell 2 days ago also, sustaining that bruise.  He denies chest pain or difficulty breathing, he denies cough.  He is unsure why he fell.  He denies urinary symptoms.    Clinical Impression  Patient limited for functional mobility as stated below secondary to BLE weakness, fatigue and poor standing balance.  Patient desaturated with exertion, SpO2 dropped to 80%, but quickly recovers to WNL after resting, very unsteady on feet and limited to a few steps to transfer to chair and requested to back to bed after a few minutes due to not feeling well - RN notified.  Patient will benefit from continued physical therapy in hospital and recommended venue below to increase strength, balance, endurance for safe ADLs and gait.     Follow Up Recommendations SNF;Supervision for mobility/OOB;Supervision/Assistance - 24 hour    Equipment Recommendations  None recommended by PT    Recommendations for Other Services       Precautions / Restrictions Precautions Precautions: Fall Restrictions Weight Bearing Restrictions: No      Mobility  Bed Mobility Overal bed mobility: Needs Assistance Bed Mobility: Supine to Sit;Sit to Supine     Supine to sit: Mod  assist Sit to supine: Mod assist   General bed mobility comments: slow labored movement  Transfers Overall transfer level: Needs assistance Equipment used: Rolling walker (2 wheeled) Transfers: Sit to/from [facility] Sit to Stand: Mod assist Stand pivot transfers: Mod assist       General transfer comment: difficulty for sit to stands due to BLE weakness  Ambulation/Gait Ambulation/Gait assistance: Mod assist;Max assist Gait Distance (Feet): 4 Feet Assistive device: Rolling walker (2 wheeled) Gait Pattern/deviations: Decreased step length - right;Decreased step length - left;Decreased stride length Gait velocity: slow   General Gait Details: limited to 4-5 slow unsteady labored steps at bedside due to generalized weakness and mild SOB  Stairs            Wheelchair Mobility    Modified Rankin (Stroke Patients Only)       Balance Overall balance assessment: Needs assistance Sitting-balance support: Feet supported;No upper extremity supported Sitting balance-Leahy Scale: Fair     Standing balance support: Bilateral upper extremity supported;During functional activity Standing balance-Leahy Scale: Poor Standing balance comment: fair/poor using RW                             Pertinent Vitals/Pain Pain Assessment: No/denies pain    Home Living Family/patient expects to be discharged to:: Private residence Living Arrangements: Children Available Help at Discharge: Family Type of Home: House Home Access: Stairs to enter Entrance Stairs-Rails: None Entrance Stairs-Number of Steps: 1 at side entrance,  no rail Home Layout: One level Home Equipment: Walker - 2 wheels;Bedside commode;Walker - 4 wheels      Prior Function Level of Independence: Needs assistance   Gait / Transfers Assistance Needed: household ambulator with RW  ADL's / Homemaking Assistance Needed: has caregiver 4 hours/day x 6 days/week, son stays with him  night  Comments: is PRN at home iwth supplemental oxygen (2L/min) he reports using rollator to mobilize in home and outside of home when neede, he reports he requires assistance for bathing, dressing, and meal preparation at baseline     Hand Dominance   Dominant Hand: Right    Extremity/Trunk Assessment   Upper Extremity Assessment Upper Extremity Assessment: Generalized weakness    Lower Extremity Assessment Lower Extremity Assessment: Generalized weakness    Cervical / Trunk Assessment Cervical / Trunk Assessment: Kyphotic  Communication   Communication: No difficulties  Cognition Arousal/Alertness: Awake/alert Behavior During Therapy: WFL for tasks assessed/performed Overall Cognitive Status: History of cognitive impairments - at baseline                                        General Comments      Exercises     Assessment/Plan    PT Assessment Patient needs continued PT services  PT Problem List Decreased strength;Decreased activity tolerance;Decreased balance;Decreased mobility       PT Treatment Interventions Gait training;Functional mobility training;Therapeutic activities;Therapeutic exercise;Stair training;Patient/family education    PT Goals (Current goals can be found in the Care Plan section)  Acute Rehab PT Goals Patient Stated Goal: return home with family to assist PT Goal Formulation: With patient Time For Goal Achievement: 07/23/19 Potential to Achieve Goals: Good    Frequency Min 3X/week   Barriers to discharge        Co-evaluation               AM-PAC PT "6 Clicks" Mobility  Outcome Measure Help needed turning from your back to your side while in a flat bed without using bedrails?: A Little Help needed moving from lying on your back to sitting on the side of a flat bed without using bedrails?: A Lot Help needed moving to and from a bed to a chair (including a wheelchair)?: A Lot Help needed standing up from a  chair using your arms (e.g., wheelchair or bedside chair)?: A Lot Help needed to walk in hospital room?: A Lot Help needed climbing 3-5 steps with a railing? : Total 6 Click Score: 12    End of Session   Activity Tolerance: Patient tolerated treatment well;Patient limited by fatigue Patient left: in bed;with call bell/phone within reach Nurse Communication: Mobility status PT Visit Diagnosis: Unsteadiness on feet (R26.81);Other abnormalities of gait and mobility (R26.89);Muscle weakness (generalized) (M62.81)    Time: 7829-5621 PT Time Calculation (min) (ACUTE ONLY): 33 min   Charges:   PT Evaluation $PT Eval Moderate Complexity: 1 Mod PT Treatments $Therapeutic Activity: 23-37 mins        3:50 PM, 07/09/19 [name], MPT Physical Therapist with [facility] [phone] office [phone] mobile phone

## 2019-07-09 NOTE — Plan of Care (Signed)
  Problem: Acute Rehab PT Goals(only PT should resolve) Goal: Pt Will Go Supine/Side To Sit Outcome: Progressing Flowsheets (Taken 07/09/2019 1552) Pt will go Supine/Side to Sit: with minimal assist Goal: Patient Will Transfer Sit To/From Stand Outcome: Progressing Flowsheets (Taken 07/09/2019 1552) Patient will transfer sit to/from stand: with minimal assist Goal: Pt Will Transfer Bed To Chair/Chair To Bed Outcome: Progressing Flowsheets (Taken 07/09/2019 1552) Pt will Transfer Bed to Chair/Chair to Bed: with min assist Goal: Pt Will Ambulate Outcome: Progressing Flowsheets (Taken 07/09/2019 1552) Pt will Ambulate:  25 feet  with minimal assist  with moderate assist  with rolling walker   3:52 PM, 07/09/19 [name], MPT Physical Therapist with [facility] [phone] office [phone] mobile phone

## 2019-07-09 NOTE — Progress Notes (Signed)
RN paged Tylene Fantasia, NP to make her aware patient's BP is 163/111, awaiting response.  P.J. Linus Mako, RN

## 2019-07-09 NOTE — TOC Initial Note (Signed)
Transition of Care [facility]) - Initial/Assessment Note    Patient Details  Name: Samuel Ryan MRN: [id number] Date of Birth: 11-Jul-1931  Transition [facility] Care [facility]) CM/SW Contact:    Boneta Lucks, RN Phone Number: 07/09/2019, 3:16 PM  Clinical Narrative:     Patient admitted for a fall under Observation. Patient lives alone, Son- Samuel Ryan checks on him. Son states patient has become more weak and multiple falls.  The family provides transportation as needed.  Patient has used [facility] in the past. PT is recommending SNF if family can not assist more.  Son is concerned with COVID. Patient was recently at [facility] in 2019, went over his days of stay, has an outstanding balance. Review mutliple facilities with the son. He will will contact [facility] about paying the balance.  Son agreed to [facility] sending out referral to other facilities. [facility] in [place] made a bed offer. Waiting for [facility] to decide. Patient has [employer], Nursing home will need to start authorization.              Expected Discharge Plan: Skilled Nursing Facility Barriers to Discharge: Continued Medical Work up, SNF Pending bed offer   Patient Goals and CMS Choice Patient states their goals for this hospitalization and ongoing recovery are:: to go to SNF. CMS Medicare.gov Compare Post Acute Care list provided to:: Patient Represenative (must comment)(SON) Choice offered to / list presented to : Patient  Expected Discharge Plan and Services Expected Discharge Plan: [place] In-house Referral: Clinical Social Work Discharge Planning Services: CM Consult Post Acute Care Choice: [place] Living arrangements for the past 2 months: [place]                   Prior Living Arrangements/Services Living arrangements for the past 2 months: Single Family Home Lives with:: Self Patient language and need for interpreter reviewed:: Yes        Need for Family  Participation in Patient Care: Yes (Comment) Care giver support system in place?: Yes (comment)   Criminal Activity/Legal Involvement Pertinent to Current Situation/Hospitalization: No - Comment as needed  Activities of Daily Living Home Assistive Devices/Equipment: Gilford Rile (specify type) ADL Screening (condition at time of admission) Patient's cognitive ability adequate to safely complete daily activities?: Yes Is the patient deaf or have difficulty hearing?: No Does the patient have difficulty seeing, even when wearing glasses/contacts?: No Does the patient have difficulty concentrating, remembering, or making decisions?: No Patient able to express need for assistance with ADLs?: Yes Does the patient have difficulty dressing or bathing?: No Independently performs ADLs?: Yes (appropriate for developmental age) Does the patient have difficulty walking or climbing stairs?: Yes Weakness of Legs: Both Weakness of Arms/Hands: Both  Permission Sought/Granted   Permission granted to share information with : Yes, Verbal Permission Granted        Permission granted to share info w Relationship: son- Samuel Ryan  Permission granted to share info w Contact Information: SNF  Emotional Assessment       Orientation: : Oriented to Self, Oriented to  Time Alcohol / Substance Use: Not Applicable Psych Involvement: No (comment)  Admission diagnosis:  Fall [W19.XXXA] Fall, initial encounter [id number]] Urinary tract infection without hematuria, site unspecified [N39.0] Syncope, unspecified syncope type [R55] Patient Active Problem List   Diagnosis Date Noted  . At risk for falling 07/09/2019  . Falls 07/08/2019  . Bilateral hand pain 11/28/2018  . Knee pain, left 10/10/2018  .  History of total knee arthroplasty, right 10/10/2018  . Unilateral primary osteoarthritis, left knee 10/10/2018  . Paroxysmal SVT (supraventricular tachycardia) ([place]) 09/25/2018  . Uncontrolled type 2 diabetes mellitus  with hyperglycemia ([place]) 09/22/2018  . Dementia without behavioral disturbance ([place]) 09/22/2018  . Fall 08/10/2018  . Morbid obesity ([place]) 06/12/2018  . Anemia 09/14/2017  . CKD (chronic kidney disease) stage 3, GFR 30-59 ml/min (HCC) 09/14/2017  . Atrial fibrillation ([place]) 09/14/2017  . Hypoalbuminemia 11/23/2016  . Hypoxia 10/29/2016  . Urinary frequency 09/17/2015  . At high risk for falls 03/30/2015  . Generalized osteoarthritis 07/16/2014  . Urinary incontinence 07/16/2014  . Pulmonary interstitial fibrosis ([place]) 07/03/2014  . Abnormal CXR (chest x-ray) 05/27/2014  . Thyroid mass of unclear etiology 05/07/2014  . Vitamin D deficiency 01/31/2014  . Allergic rhinitis 01/31/2014  . Diverticulosis of sigmoid colon 12/13/2012  . Right ventricular dysfunction 07/20/2011  . Heart disease, unspecified 07/20/2011  . Alzheimer's dementia without behavioral disturbance ([place]) 09/12/2010  . Goiter 06/19/2009  . Essential hypertension 12/11/2008  . Uncontrolled diabetes mellitus with diabetic nephropathy, with long-term current use of insulin ([place]) 09/05/2008  . Hyperlipidemia with target LDL less than 100 07/19/2008  . BPH (benign prostatic hyperplasia) 03/28/2008   PCP:  [name], MD Pharmacy:   [facility], [place] [address] Phone: [phone] Fax: [facility] [place], [place] [address] Phone: [phone] Fax: [phone]     Social Determinants of Health (SDOH) Interventions    Readmission Risk Interventions Readmission Risk Prevention Plan 07/09/2019  Transportation Screening Complete  PCP or Specialist Appt within 3-5 Days Not Complete  HRI or [place] Complete  Social Work Consult for [place] Planning/Counseling Complete  Palliative Care Screening Not Complete  Medication Review Human resources officer) Complete  Some recent data might be hidden

## 2019-07-10 DIAGNOSIS — Z9181 History of falling: Secondary | ICD-10-CM

## 2019-07-10 LAB — CBC
HCT: 40.2 % (ref 39.0–52.0)
Hemoglobin: 12.6 g/dL — ABNORMAL LOW (ref 13.0–17.0)
MCH: 31 pg (ref 26.0–34.0)
MCHC: 31.3 g/dL (ref 30.0–36.0)
MCV: 98.8 fL (ref 80.0–100.0)
Platelets: 194 10*3/uL (ref 150–400)
RBC: 4.07 MIL/uL — ABNORMAL LOW (ref 4.22–5.81)
RDW: 14.2 % (ref 11.5–15.5)
WBC: 5.7 10*3/uL (ref 4.0–10.5)
nRBC: 0 % (ref 0.0–0.2)

## 2019-07-10 LAB — BASIC METABOLIC PANEL
Anion gap: 11 (ref 5–15)
BUN: 16 mg/dL (ref 8–23)
CO2: 22 mmol/L (ref 22–32)
Calcium: 8.7 mg/dL — ABNORMAL LOW (ref 8.9–10.3)
Chloride: 107 mmol/L (ref 98–111)
Creatinine, Ser: 1.41 mg/dL — ABNORMAL HIGH (ref 0.61–1.24)
GFR calc Af Amer: 52 mL/min — ABNORMAL LOW (ref >60)
GFR calc non Af Amer: 45 mL/min — ABNORMAL LOW (ref >60)
Glucose, Bld: 146 mg/dL — ABNORMAL HIGH (ref 70–99)
Potassium: 4.3 mmol/L (ref 3.5–5.1)
Sodium: 140 mmol/L (ref 135–145)

## 2019-07-10 LAB — GLUCOSE, CAPILLARY
Glucose-Capillary: 120 mg/dL — ABNORMAL HIGH (ref 70–99)
Glucose-Capillary: 153 mg/dL — ABNORMAL HIGH (ref 70–99)
Glucose-Capillary: 132 mg/dL — ABNORMAL HIGH (ref 70–99)
Glucose-Capillary: 241 mg/dL — ABNORMAL HIGH (ref 70–99)

## 2019-07-10 MED ORDER — HYDRALAZINE HCL 20 MG/ML IJ SOLN
5.0000 mg | Freq: Once | INTRAMUSCULAR | Status: AC
  Administered 2019-07-10: 01:00:00 5 mg via INTRAVENOUS
  Filled 2019-07-10: qty 1

## 2019-07-10 MED ORDER — HYDRALAZINE HCL 20 MG/ML IJ SOLN: 10.0000 mg | INTRAMUSCULAR | Status: DC | PRN

## 2019-07-10 NOTE — TOC Progression Note (Signed)
Transition of Care [facility]) - Progression Note    Patient Details  Name: SOPHIA CUBERO MRN: [id number] Date of Birth: 15-Nov-1931  Transition of [facility]) CM/SW Contact  Boneta Lucks, RN Phone Number: 07/10/2019, 1:54 PM  Clinical Narrative:   [facility] in [place] offer a bed.  Spoke with Son, he agreed.  Patient has [facility] Medicare, waiting on INS AUTH, and will need negative COVID 24-48 hours.  MD aware. Will consider doing rapid COVID if Auth approved tomorrow.    Expected Discharge Plan: Skilled Nursing Facility Barriers to Discharge: Continued Medical Work up, SNF Pending bed offer  Expected Discharge Plan and Services Expected Discharge Plan: [place] In-house Referral: Clinical Social Work Discharge Planning Services: CM Consult Post Acute Care Choice: [place] arrangements for the past 2 months: Single Family Home                                       Social Determinants of Health (SDOH) Interventions    Readmission Risk Interventions Readmission Risk Prevention Plan 07/09/2019  Transportation Screening Complete  PCP or Specialist Appt within 3-5 Days Not Complete  HRI or Home Care Consult Complete  Social Work Consult for [place] Planning/Counseling Complete  Palliative Care Screening Not Complete  Medication Review Press photographer) Complete  Some recent data might be hidden

## 2019-07-10 NOTE — Progress Notes (Addendum)
PROGRESS NOTE  Samuel Ryan  WYO:[id number]  DOB: 1931-06-04  DOA: 07/08/2019 PCP: [name], MD  Brief Admission Hx: 85 year old gentleman with multiple medical problems including dementia, hypertension, CKD, atrial fibrillation and interstitial fibrosis presented to the ED with frequent falls and found down on the floor at home for several hours.  MDM/Assessment & Plan:   1. Frequent falls-suspect this is multifactorial situation secondary to his advanced age and dementia and I have asked for PT evaluation.  SNF placement in progress.  Fall precautions recommended.  Gentle hydration was given and patient feeling better.  Urine culture negative. 2. Dementia-patient has been maintained on donezepil and sertraline. 3. Stage III CKD-stable. 4. Type 2 diabetes mellitus- I think that his glipizide should be discontinued because I worry that he is having hypoglycemia at home this could cause him to fall.  His Lantus has been restarted and we will follow his blood sugars closely.  I would NOT restart glipizide at discharge.  Given his advanced age and CKD would consider tradjenta 5 mg daily at discharge. 5. Essential hypertension-he has been resumed on his home meds, IV hydralazine as needed.  Lasix temporarily held in the setting of dehydration. 6. Paroxysmal atrial fibrillation- he has been restarted on home atenolol.  He is not anticoagulated due to frequent falling. 7. Abnormal urinalysis- urine culture negative for infection.  DVT prophylaxis: Lovenox Code Status: Full Family Communication: Spoke with his grandson telephone 8/10 Disposition Plan: SNF when bed available  Consultants:  PT  Procedures:    Antimicrobials:     Subjective: Patient has no specific complaints.  He is pleasant but has dementia, oriented to self.   Objective: Vitals:   07/09/19 2126 07/09/19 2339 07/10/19 0131 07/10/19 0515  BP: (!) 159/105 (!) 163/111 (!) 146/89 (!) 155/97  Pulse: 94 96 96  100  Resp: (!) 22   20  Temp: 98 F (36.7 C)   (!) 97.5 F (36.4 C)  TempSrc: Oral   Oral  SpO2: 93%   93%  Weight:      Height:        Intake/Output Summary (Last 24 hours) at 07/10/2019 1340 Last data filed at 07/10/2019 0900 Gross per 24 hour  Intake 60 ml  Output 2050 ml  Net -1990 ml   Filed Weights   07/08/19 1011 07/08/19 2121  Weight: 116 kg 112.1 kg     REVIEW OF SYSTEMS  As per history otherwise all reviewed and reported negative  Exam:  General exam: with dementia, oriented to self only, elderly male lying in the bed he is chronically ill-appearing but he is pleasant and cooperative. Respiratory system: Clear. No increased work of breathing. Cardiovascular system: S1 & S2 heard. No JVD, murmurs, gallops, clicks or pedal edema. Gastrointestinal system: Abdomen is nondistended, soft and nontender. Normal bowel sounds heard. Central nervous system: Alert and oriented to person only. No focal neurological deficits. Extremities: 1+ edema BLEs, no cyanosis or clubbing.  Data Reviewed: Basic Metabolic Panel: Recent Labs  Lab 07/08/19 1039 07/09/19 0915 07/10/19 0600  NA 140 139 140  K 5.1 4.9 4.3  CL 105 105 107  CO2 27 25 22   GLUCOSE 123* 166* 146*  BUN 15 15 16   CREATININE 1.58* 1.55* 1.41*  CALCIUM 8.6* 8.5* 8.7*   Liver Function Tests: Recent Labs  Lab 07/08/19 1039  AST 25  ALT 14  ALKPHOS 65  BILITOT 1.0  PROT 7.8  ALBUMIN 3.2*   No results for input(s):  LIPASE, AMYLASE in the last 168 hours. No results for input(s): AMMONIA in the last 168 hours. CBC: Recent Labs  Lab 07/08/19 1039 07/09/19 0915 07/10/19 0600  WBC 5.8 5.5 5.7  NEUTROABS 3.5  --   --   HGB 11.3* 12.0* 12.6*  HCT 35.8* 38.1* 40.2  MCV 100.0 99.7 98.8  PLT 192 196 194   Cardiac Enzymes: Recent Labs  Lab 07/08/19 1039  CKTOTAL 59   CBG (last 3)  Recent Labs    07/09/19 2125 07/10/19 0728 07/10/19 1123  GLUCAP 113* 120* 153*   Recent Results (from the  past 240 hour(s))  SARS CORONAVIRUS 2 Nasal Swab Aptima Multi Swab     Status: None   Collection Time: 07/08/19  2:51 PM   Specimen: Aptima Multi Swab; Nasal Swab  Result Value Ref Range Status   SARS Coronavirus 2 NEGATIVE NEGATIVE Final    Comment: (NOTE) SARS-CoV-2 target nucleic acids are NOT DETECTED. The SARS-CoV-2 RNA is generally detectable in upper and lower respiratory specimens during the acute phase of infection. Negative results do not preclude SARS-CoV-2 infection, do not rule out co-infections with other pathogens, and should not be used as the sole basis for treatment or other patient management decisions. Negative results must be combined with clinical observations, patient history, and epidemiological information. The expected result is Negative. Fact Sheet for Patients: SugarRoll.be Fact Sheet for Healthcare Providers: https://www.woods-mathews.com/ This test is not yet approved or cleared by the [place] FDA and  has been authorized for detection and/or diagnosis of SARS-CoV-2 by FDA under an Emergency Use Authorization (EUA). This EUA will remain  in effect (meaning this test can be used) for the duration of the COVID-19 declaration under Section 56 4(b)(1) of the Act, 21 U.S.C. section 360bbb-3(b)(1), unless the authorization is terminated or revoked sooner. Performed at [facility], [place] [address] [place], [place] [address]   Urine culture     Status: None   Collection Time: 07/08/19  3:12 PM   Specimen: Urine, Random  Result Value Ref Range Status   Specimen Description   Final    URINE, RANDOM Performed at [facility], [address] [place], [place] [address]    Special Requests   Final    NONE Performed at [facility], [address] [place], [place] [address]    Culture   Final    NO GROWTH Performed at [facility], [place] [address], [place], [place] [address]    Report Status  07/09/2019 FINAL  Final     Studies: No results found.   Scheduled Meds: . atenolol  25 mg Oral Daily  . donepezil  10 mg Oral QHS  . enoxaparin (LOVENOX) injection  60 mg Subcutaneous Q24H  . glipiZIDE  10 mg Oral Q breakfast  . insulin aspart  0-9 Units Subcutaneous TID WC  . insulin glargine  10 Units Subcutaneous q morning - 10a  . pantoprazole  40 mg Oral Daily  . sertraline  50 mg Oral Daily  . tamsulosin  0.4 mg Oral Daily   Continuous Infusions:  Active Problems:   Falls   At risk for falling   Time spent:   [name] Brakeman, MD [facility] 07/10/2019, 1:40 PM    LOS: 1 day  How to contact the [facility] Attending or Consulting provider [place] or covering provider during after hours [place], for this patient?  1. Check the care team in [facility] and look for a)  attending/consulting TRH provider listed and b) the [facility] team listed 2. Log into [email or web address] and use [place] universal password to access. If you do not have the password, please contact the hospital operator. 3. Locate the [facility] provider you are looking for under [facility] and page to a number that you can be directly reached. 4. If you still have difficulty reaching the provider, please page the [facility] (Director on Call) for the Hospitalists listed on amion for assistance.

## 2019-07-10 NOTE — Progress Notes (Signed)
Physical Therapy Treatment Patient Details Name: Samuel Ryan MRN: [id number] DOB: [birth year] Today's Date: 07/10/2019    History of Present Illness Samuel Ryan is a 85 y.o. male with medical history significant for diabetes mellitus, dementia, hypertension, BPH, CKD 3, atrial fibrillation, interstitial fibrosis and DVT, patient was brought in by EMS with reports of a fall earlier today.  Patient is awake and alert able to give me a little bit of history and answer simple questions.  Patient lives with his son who works and they have a Actuary who comes into the house for about 4 hours every day.  Patient was unable to get up onto his sitter got there about 4 hours later.Patient has an abrasion to the right upper side of his head, he tells me he fell 2 days ago also, sustaining that bruise.  He denies chest pain or difficulty breathing, he denies cough.  He is unsure why he fell.  He denies urinary symptoms.    PT Comments    Samuel Ryan did well better today with bed mobilities, required lots of tactile and verbal cues to get him sitting upright, and moderate assist to get him sitting on edge of bed. Continued verbal cueing required during remainder of session and patient was able to take few forward steps and side steps with mod assist to get him sitting in chair. Dizziness reported with transitional changes. O2 sats read between 90-94 throughout session. Dizziness still reported end of session. Nurse notified of dizziness, patient left in chair and how to safely transfer patient back in bed.  Lift pad placed in chair for nursing if need be for dependent transfer back to bed. Patient will benefit from continued physical therapy in hospital and recommended venue below to increase strength, balance, endurance for safe ADLs and gait.   Follow Up Recommendations  SNF;Supervision for mobility/OOB;Supervision/Assistance - 24 hour     Equipment Recommendations  None recommended by PT     Recommendations for Other Services       Precautions / Restrictions Precautions Precautions: Fall Restrictions Weight Bearing Restrictions: No    Mobility  Bed Mobility Overal bed mobility: Needs Assistance Bed Mobility: Supine to Sit     Supine to sit: Mod assist     General bed mobility comments: slow labored movement and breathing, dizziness reported  Transfers Overall transfer level: Needs assistance Equipment used: Rolling walker (2 wheeled) Transfers: Sit to/from Stand Sit to Stand: Mod assist         General transfer comment: difficulty for sit to stands due to BLE weakness  Ambulation/Gait Ambulation/Gait assistance: Mod assist;Max assist;+2 safety/equipment   Assistive device: Rolling walker (2 wheeled) Gait Pattern/deviations: Decreased step length - right;Decreased step length - left;Decreased stride length Gait velocity: slow   General Gait Details: limited to 8-10 slow unsteady labored steps at bedside due to generalized weakness and mild SOB, difficulty side stepping, verbal cues to move R LE   Stairs             Wheelchair Mobility    Modified Rankin (Stroke Patients Only)       Balance                                            Cognition  Exercises General Exercises - Lower Extremity Long Arc Quad: AROM;Both;Strengthening;10 reps;Seated Hip Flexion/Marching: Seated;AROM;Strengthening;Both;10 reps Heel Raises: 10 reps;Both;Strengthening;AROM;Seated    General Comments        Pertinent Vitals/Pain      Home Living                      Prior Function            PT Goals (current goals can now be found in the care plan section)      Frequency    Min 3X/week      PT Plan Current plan remains appropriate    Co-evaluation              AM-PAC PT "6 Clicks" Mobility   Outcome Measure  Help needed turning from your  back to your side while in a flat bed without using bedrails?: A Little Help needed moving from lying on your back to sitting on the side of a flat bed without using bedrails?: A Lot Help needed moving to and from a bed to a chair (including a wheelchair)?: A Lot Help needed standing up from a chair using your arms (e.g., wheelchair or bedside chair)?: A Lot Help needed to walk in hospital room?: A Lot Help needed climbing 3-5 steps with a railing? : Total 6 Click Score: 12    End of Session Equipment Utilized During Treatment: Gait belt Activity Tolerance: Patient tolerated treatment well;Patient limited by fatigue Patient left: with call bell/phone within reach;in chair;with chair alarm set Nurse Communication: Mobility status PT Visit Diagnosis: Unsteadiness on feet (R26.81);Other abnormalities of gait and mobility (R26.89);Muscle weakness (generalized) (M62.81)     Time: 8592-9244 PT Time Calculation (min) (ACUTE ONLY): 28 min  Charges:  $Therapeutic Exercise: 8-22 mins $Therapeutic Activity: 8-22 mins                     Jerene Pitch, DPT Physical Therapy with [facility]  [phone] office 07/10/2019, 4:04 PM

## 2019-07-11 ENCOUNTER — Inpatient Hospital Stay (HOSPITAL_COMMUNITY): Payer: Medicare HMO

## 2019-07-11 LAB — GLUCOSE, CAPILLARY
Glucose-Capillary: 134 mg/dL — ABNORMAL HIGH (ref 70–99)
Glucose-Capillary: 120 mg/dL — ABNORMAL HIGH (ref 70–99)
Glucose-Capillary: 163 mg/dL — ABNORMAL HIGH (ref 70–99)
Glucose-Capillary: 183 mg/dL — ABNORMAL HIGH (ref 70–99)
Glucose-Capillary: 183 mg/dL — ABNORMAL HIGH (ref 70–99)

## 2019-07-11 LAB — TSH: TSH: 0.4 u[IU]/mL (ref 0.350–4.500)

## 2019-07-11 LAB — AMMONIA: Ammonia: 14 umol/L (ref 9–35)

## 2019-07-11 MED ORDER — INSULIN GLARGINE 100 UNIT/ML ~~LOC~~ SOLN
5.0000 [IU] | Freq: Every morning | SUBCUTANEOUS | Status: DC
  Administered 2019-07-11 – 2019-07-13 (×3): 5 [IU] via SUBCUTANEOUS
  Filled 2019-07-11 (×5): qty 0.05

## 2019-07-11 MED ORDER — LACTATED RINGERS IV SOLN
INTRAVENOUS | Status: DC
  Administered 2019-07-11 (×2): via INTRAVENOUS

## 2019-07-11 NOTE — TOC Progression Note (Signed)
Transition of Care [facility]) - Progression Note    Patient Details  Name: Samuel Ryan MRN: [id number] Date of Birth: 05/16/1931  [facility] CM/SW Contact  Boneta Lucks, RN Phone Number: 07/11/2019, 3:01 PM  Clinical Narrative:   Patient was accepted a [facility] in [place]. They are having to stop all admission for a unknown period of time.  Son wanting to send referral to [facility]. CM spoke with Elyse Hsu they do not accept [employer], CM spoke with Tammy at the [facility], they will accept the patient if they will pay there bill, left over copays for last stay.  Updated the Son with balance information.  He agrees to go by and pay the balance this afternoon. Tammy will start INS Auth. MD updated.     Expected Discharge Plan: Skilled Nursing Facility Barriers to Discharge: Continued Medical Work up, SNF Pending bed offer  Expected Discharge Plan and Services Expected Discharge Plan: [place] In-house Referral: Clinical Social Work Discharge Planning Services: CM Consult Post Acute Care Choice: [place] arrangements for the past 2 months: Single Family Home                     Social Determinants of Health (SDOH) Interventions    Readmission Risk Interventions Readmission Risk Prevention Plan 07/09/2019  Transportation Screening Complete  PCP or Specialist Appt within 3-5 Days Not Complete  HRI or Home Care Consult Complete  Social Work Consult for [place] Planning/Counseling Complete  Palliative Care Screening Not Complete  Medication Review [employer]) Complete  Some recent data might be hidden

## 2019-07-11 NOTE — Care Management Important Message (Signed)
Important Message  Patient Details  Name: Samuel Ryan MRN: [id number] Date of Birth: 04/05/1931   Medicare Important Message Given:  Yes     [name] 07/11/2019, 1:58 PM

## 2019-07-11 NOTE — Progress Notes (Signed)
patient was able to tell me where he was at, why he was in the hospital, what visitors came to visit him this afternoon. And patient took po medicines with applesauce fine. Call bell in reach. Bed alarm on. Fall mat in place. Will continue to monitor throughout shift.

## 2019-07-11 NOTE — Progress Notes (Signed)
Patients oxygen level dropped to 89 on room air. Placed oxygen nasal canula at 2 liters. Paged mid level for patients oxygen and blood pressure. Blood pressure reading was 96/58. Mid level gave verbal to hold 2200 dose of Aricept due to patient being lethargic. Mid level placed order for chest xray. Will continue to monitor throughout shift.

## 2019-07-11 NOTE — Progress Notes (Signed)
Physical Therapy Treatment Patient Details Name: Samuel Ryan MRN: [id number] DOB: Jul 20, 1931 Today's Date: 07/11/2019    History of Present Illness Samuel Ryan is a 85 y.o. male with medical history significant for diabetes mellitus, dementia, hypertension, BPH, CKD 3, atrial fibrillation, interstitial fibrosis and DVT, patient was brought in by EMS with reports of a fall earlier today.  Patient is awake and alert able to give me a little bit of history and answer simple questions.  Patient lives with his son who works and they have a Actuary who comes into the house for about 4 hours every day.  Patient was unable to get up onto his sitter got there about 4 hours later.Patient has an abrasion to the right upper side of his head, he tells me he fell 2 days ago also, sustaining that bruise.  He denies chest pain or difficulty breathing, he denies cough.  He is unsure why he fell.  He denies urinary symptoms.    PT Comments    Following discussion with RN and son in room pt not doing well today.  Pt very lethargic and required cueing through session to open eyes and respond to questions.  Due to fatigue/lethergy unable to complete transfer training.  Pt's bed wet, NT informed.  Assisted NT with cleansing pt with mod A for bed mobility, increased time to complete and delay to following cues.  Max A for sitting on EOB and tendency to lean to Rt due to weak core.  EOS pt left in bed with NT and son in room.  RN aware of status.     Follow Up Recommendations  SNF;Supervision for mobility/OOB;Supervision/Assistance - 24 hour     Equipment Recommendations  None recommended by PT    Recommendations for Other Services       Precautions / Restrictions Precautions Precautions: Fall Restrictions Weight Bearing Restrictions: No    Mobility  Bed Mobility Overal bed mobility: Needs Assistance Bed Mobility: Rolling Rolling: Mod assist   Supine to sit: Max assist     General bed mobility  comments: slow labored movement, delayed follow cues, mod A with trunk for NT to clean  Transfers                    Ambulation/Gait                 Stairs             Wheelchair Mobility    Modified Rankin (Stroke Patients Only)       Balance                                            Cognition Arousal/Alertness: Lethargic Behavior During Therapy: Flat affect Overall Cognitive Status: History of cognitive impairments - at baseline                                        Exercises      General Comments        Pertinent Vitals/Pain Pain Score: 0-No pain    Home Living                      Prior Function            PT  Goals (current goals can now be found in the care plan section)      Frequency    Min 3X/week      PT Plan Current plan remains appropriate    Co-evaluation              AM-PAC PT "6 Clicks" Mobility   Outcome Measure  Help needed turning from your back to your side while in a flat bed without using bedrails?: A Lot Help needed moving from lying on your back to sitting on the side of a flat bed without using bedrails?: A Lot Help needed moving to and from a bed to a chair (including a wheelchair)?: Total Help needed standing up from a chair using your arms (e.g., wheelchair or bedside chair)?: Total Help needed to walk in hospital room?: Total Help needed climbing 3-5 steps with a railing? : Total 6 Click Score: 8    End of Session   Activity Tolerance: Patient limited by lethargy Patient left: in bed;with call bell/phone within reach;with nursing/sitter in room;with family/visitor present Nurse Communication: Mobility status PT Visit Diagnosis: Unsteadiness on feet (R26.81);Other abnormalities of gait and mobility (R26.89);Muscle weakness (generalized) (M62.81)     Time: 8242-3536 PT Time Calculation (min) (ACUTE ONLY): 28 min  Charges:  $Therapeutic  Activity: 8-22 mins                     [address], LPTA; [facility] [phone]  Aldona Lento 07/11/2019, 5:41 PM

## 2019-07-11 NOTE — Progress Notes (Signed)
Patient seems to be more alert at this time than when shift started. patient able to hold conversation with nurse and take night time po  medication.  Will continue to assess patient throughout shift.

## 2019-07-11 NOTE — Progress Notes (Signed)
Patient resting in bed at this time. Eye closed breathing is even and nonlabored. Patients caregiver is at bedside. patient opens eyes to voice. Patients lung sounds are diminished in bases. Pulses are strong in radial and pedal pulses are moderate. Patient has abrasion to anterior head from fall that occurred at home. Call bell in reach. Will continue to monitor patient throughout shift.

## 2019-07-11 NOTE — Progress Notes (Signed)
PROGRESS NOTE    Samuel Ryan  ZDG:[id number] DOB: [birth year] DOA: 07/08/2019 PCP: [name], MD   Brief Narrative:  85 year old gentleman with multiple medical problems including dementia, hypertension, CKD, atrial fibrillation and interstitial fibrosis presented to the ED with frequent falls and found down on the floor at home for several hours.  8/12: CT head on admission without any acute abnormalities, but patient is now experiencing some more somnolence and altered mentation today.  PT recommended SNF on discharge.  Assessment & Plan:   Active Problems:   Falls   At risk for falling   Frequent falls- multifactorial with advanced age and dementia -SNF on discharge per PT recommendations -Gentle hydration as urine is darker -CK negative  Dementia with some acute encephalopathy -Continue donepezil and sertraline -CT head negative on admission -We will plan to check TSH and ammonia levels today -Repeat lab work in a.m. -Gentle IV hydration  CKD stage III-stable  Type 2 diabetes-stable -Discontinue glipizide on discharge -We will plan to start Tradjenta 5 mg daily on discharge  Essential hypertension-stable -Currently held on Lasix due to dehydration  Paroxysmal atrial fibrillation -Continue home atenolol with good heart rate control -No anticoagulation due to frequent falls  Abnormal urine analysis -Urine culture negative for infection -We will consider repeating as needed for persistent encephalopathy   DVT prophylaxis: Lovenox Code Status: Full Family Communication: Spoke with son at bedside Disposition Plan: SNF on discharge hopefully in the next 24 to 48 hours once mentation is improved.  Will perform rapid COVID testing once insurance authorization received.   Consultants:   None  Procedures:   None  Antimicrobials:   None   Subjective: Patient seen and evaluated today with increased somnolence noted today per son at bedside.  He  appears to be eating slightly less.  He is having normal urination, but no recent bowel movements.  He appears to have slept well last night.  Objective: Vitals:   07/10/19 1805 07/10/19 2031 07/10/19 2133 07/11/19 0501  BP: 104/67  109/62 118/70  Pulse: 76  69 (!) 103  Resp:   18 20  Temp:   98.1 F (36.7 C) 97.7 F (36.5 C)  TempSrc:   Oral Oral  SpO2:  94% 93% 90%  Weight:      Height:        Intake/Output Summary (Last 24 hours) at 07/11/2019 1239 Last data filed at 07/11/2019 1100 Gross per 24 hour  Intake 550 ml  Output 301 ml  Net 249 ml   Filed Weights   07/08/19 1011 07/08/19 2121  Weight: 116 kg 112.1 kg    Examination:  General exam: Appears calm and comfortable, somnolent Respiratory system: Clear to auscultation. Respiratory effort normal. Cardiovascular system: S1 & S2 heard, RRR. No JVD, murmurs, rubs, gallops or clicks. No pedal edema. Gastrointestinal system: Abdomen is nondistended, soft and nontender. No organomegaly or masses felt. Normal bowel sounds heard. Central nervous system: Somnolent but arousable Extremities: No significant edema Skin: No rashes, lesions or ulcers Psychiatry: Difficult to assess Urine in Foley bag clear, yellow    Data Reviewed: I have personally reviewed following labs and imaging studies  CBC: Recent Labs  Lab 07/08/19 1039 07/09/19 0915 07/10/19 0600  WBC 5.8 5.5 5.7  NEUTROABS 3.5  --   --   HGB 11.3* 12.0* 12.6*  HCT 35.8* 38.1* 40.2  MCV 100.0 99.7 98.8  PLT 192 196 595   Basic Metabolic Panel: Recent Labs  Lab 07/08/19 1039  07/09/19 0915 07/10/19 0600  NA 140 139 140  K 5.1 4.9 4.3  CL 105 105 107  CO2 27 25 22   GLUCOSE 123* 166* 146*  BUN 15 15 16   CREATININE 1.58* 1.55* 1.41*  CALCIUM 8.6* 8.5* 8.7*   GFR: Estimated Creatinine Clearance: 46.4 mL/min (A) (by C-G formula based on SCr of 1.41 mg/dL (H)). Liver Function Tests: Recent Labs  Lab 07/08/19 1039  AST 25  ALT 14  ALKPHOS 65    BILITOT 1.0  PROT 7.8  ALBUMIN 3.2*   No results for input(s): LIPASE, AMYLASE in the last 168 hours. No results for input(s): AMMONIA in the last 168 hours. Coagulation Profile: No results for input(s): INR, PROTIME in the last 168 hours. Cardiac Enzymes: Recent Labs  Lab 07/08/19 1039  CKTOTAL 59   BNP (last 3 results) No results for input(s): PROBNP in the last 8760 hours. HbA1C: No results for input(s): HGBA1C in the last 72 hours. CBG: Recent Labs  Lab 07/10/19 1123 07/10/19 1611 07/10/19 2133 07/11/19 0727 07/11/19 1146  GLUCAP 153* 132* 241* 134* 120*   Lipid Profile: No results for input(s): CHOL, HDL, LDLCALC, TRIG, CHOLHDL, LDLDIRECT in the last 72 hours. Thyroid Function Tests: No results for input(s): TSH, T4TOTAL, FREET4, T3FREE, THYROIDAB in the last 72 hours. Anemia Panel: No results for input(s): VITAMINB12, FOLATE, FERRITIN, TIBC, IRON, RETICCTPCT in the last 72 hours. Sepsis Labs: Recent Labs  Lab 07/08/19 1039 07/08/19 1240  LATICACIDVEN 0.8 0.7    Recent Results (from the past 240 hour(s))  SARS CORONAVIRUS 2 Nasal Swab Aptima Multi Swab     Status: None   Collection Time: 07/08/19  2:51 PM   Specimen: Aptima Multi Swab; Nasal Swab  Result Value Ref Range Status   SARS Coronavirus 2 NEGATIVE NEGATIVE Final    Comment: (NOTE) SARS-CoV-2 target nucleic acids are NOT DETECTED. The SARS-CoV-2 RNA is generally detectable in upper and lower respiratory specimens during the acute phase of infection. Negative results do not preclude SARS-CoV-2 infection, do not rule out co-infections with other pathogens, and should not be used as the sole basis for treatment or other patient management decisions. Negative results must be combined with clinical observations, patient history, and epidemiological information. The expected result is Negative. Fact Sheet for Patients: SugarRoll.be Fact Sheet for Healthcare  Providers: https://www.woods-mathews.com/ This test is not yet approved or cleared by the [place] FDA and  has been authorized for detection and/or diagnosis of SARS-CoV-2 by FDA under an Emergency Use Authorization (EUA). This EUA will remain  in effect (meaning this test can be used) for the duration of the COVID-19 declaration under Section 56 4(b)(1) of the Act, 21 U.S.C. section 360bbb-3(b)(1), unless the authorization is terminated or revoked sooner. Performed at [facility], [place] [address], [place], [place] [address]   Urine culture     Status: None   Collection Time: 07/08/19  3:12 PM   Specimen: Urine, Random  Result Value Ref Range Status   Specimen Description   Final    URINE, RANDOM Performed at [facility], [address], [place], [place] [address]    Special Requests   Final    NONE Performed at [facility], [address], [place], [place] [address]    Culture   Final    NO GROWTH Performed at [facility], [place] [address] [place],  [address]    Report Status 07/09/2019 FINAL  Final  Radiology Studies: No results found.      Scheduled Meds:  atenolol  25 mg Oral Daily   donepezil  10 mg Oral QHS   enoxaparin (LOVENOX) injection  60 mg Subcutaneous Q24H   insulin aspart  0-9 Units Subcutaneous TID WC   [START ON 07/12/2019] insulin glargine  5 Units Subcutaneous q morning - 10a   pantoprazole  40 mg Oral Daily   sertraline  50 mg Oral Daily   tamsulosin  0.4 mg Oral Daily   Continuous Infusions:  lactated ringers 75 mL/hr at 07/11/19 1217     LOS: 2 days    Time spent: 30 minutes    Sevanna Ballengee Darleen Crocker, DO [facility] Pager [phone]  If 7PM-7AM, please contact night-coverage [email or web address] Password [facility] 07/11/2019, 12:39 PM

## 2019-07-12 ENCOUNTER — Inpatient Hospital Stay (HOSPITAL_COMMUNITY)
Admit: 2019-07-12 | Discharge: 2019-07-12 | Disposition: A | Discharge status: Home / Self Care | Payer: Medicare HMO | Attending: Neurology | Admitting: Neurology

## 2019-07-12 ENCOUNTER — Inpatient Hospital Stay (HOSPITAL_COMMUNITY): Payer: Medicare HMO

## 2019-07-12 DIAGNOSIS — R2689 Other abnormalities of gait and mobility: Secondary | ICD-10-CM | POA: Diagnosis not present

## 2019-07-12 DIAGNOSIS — R569 Unspecified convulsions: Secondary | ICD-10-CM | POA: Diagnosis not present

## 2019-07-12 LAB — CBC
HCT: 33.5 % — ABNORMAL LOW (ref 39.0–52.0)
Hemoglobin: 10.8 g/dL — ABNORMAL LOW (ref 13.0–17.0)
MCH: 31.9 pg (ref 26.0–34.0)
MCHC: 32.2 g/dL (ref 30.0–36.0)
MCV: 98.8 fL (ref 80.0–100.0)
Platelets: 188 10*3/uL (ref 150–400)
RBC: 3.39 MIL/uL — ABNORMAL LOW (ref 4.22–5.81)
RDW: 14.3 % (ref 11.5–15.5)
WBC: 5.3 10*3/uL (ref 4.0–10.5)
nRBC: 0 % (ref 0.0–0.2)

## 2019-07-12 LAB — GLUCOSE, CAPILLARY
Glucose-Capillary: 122 mg/dL — ABNORMAL HIGH (ref 70–99)
Glucose-Capillary: 163 mg/dL — ABNORMAL HIGH (ref 70–99)
Glucose-Capillary: 143 mg/dL — ABNORMAL HIGH (ref 70–99)
Glucose-Capillary: 197 mg/dL — ABNORMAL HIGH (ref 70–99)

## 2019-07-12 LAB — BASIC METABOLIC PANEL
Anion gap: 6 (ref 5–15)
BUN: 25 mg/dL — ABNORMAL HIGH (ref 8–23)
CO2: 25 mmol/L (ref 22–32)
Calcium: 8.1 mg/dL — ABNORMAL LOW (ref 8.9–10.3)
Chloride: 108 mmol/L (ref 98–111)
Creatinine, Ser: 1.81 mg/dL — ABNORMAL HIGH (ref 0.61–1.24)
GFR calc Af Amer: 38 mL/min — ABNORMAL LOW (ref >60)
GFR calc non Af Amer: 33 mL/min — ABNORMAL LOW (ref >60)
Glucose, Bld: 116 mg/dL — ABNORMAL HIGH (ref 70–99)
Potassium: 4.1 mmol/L (ref 3.5–5.1)
Sodium: 139 mmol/L (ref 135–145)

## 2019-07-12 LAB — BRAIN NATRIURETIC PEPTIDE: B Natriuretic Peptide: 46 pg/mL (ref 0.0–100.0)

## 2019-07-12 LAB — BLOOD GAS, ARTERIAL
Acid-Base Excess: 1.7 mmol/L (ref 0.0–2.0)
Bicarbonate: 25.7 mmol/L (ref 20.0–28.0)
FIO2: 28
O2 Saturation: 93.6 %
Patient temperature: 37
pCO2 arterial: 43.3 mmHg (ref 32.0–48.0)
pH, Arterial: 7.397 (ref 7.350–7.450)
pO2, Arterial: 72.9 mmHg — ABNORMAL LOW (ref 83.0–108.0)

## 2019-07-12 LAB — NOVEL CORONAVIRUS, NAA (HOSP ORDER, SEND-OUT TO REF LAB; TAT 18-24 HRS): SARS-CoV-2, NAA: NOT DETECTED

## 2019-07-12 MED ORDER — LACTATED RINGERS IV SOLN
INTRAVENOUS | Status: DC
  Administered 2019-07-12 (×2): via INTRAVENOUS

## 2019-07-12 MED ORDER — HEPARIN SODIUM (PORCINE) 5000 UNIT/ML IJ SOLN
5000.0000 [IU] | Freq: Three times a day (TID) | INTRAMUSCULAR | Status: DC
  Administered 2019-07-12 – 2019-07-13 (×3): 5000 [IU] via SUBCUTANEOUS
  Filled 2019-07-12 (×3): qty 1

## 2019-07-12 NOTE — Progress Notes (Signed)
PROGRESS NOTE    Samuel Ryan  HWE:[id number] DOB: 1931/08/01 DOA: 07/08/2019 PCP: [name], MD   Brief Narrative:  85 year old gentleman with multiple medical problems including dementia, hypertension, CKD, atrial fibrillation and interstitial fibrosis presented to the ED with frequent falls and found down on the floor at home for several hours.  8/12: CT head on admission without any acute abnormalities, but patient is now experiencing some more somnolence and altered mentation today.  PT recommended SNF on discharge.  8/13: Patient appears more alert this morning, but has worsening renal function noted on laboratory data.  Neurology evaluation with brain MRI and EEG currently pending.  Assessment & Plan:   Active Problems:   Falls   At risk for falling  Frequent falls- multifactorial with advanced age and dementia -SNF on discharge per PT recommendations -Gentle hydration as urine is darker -CK negative  Dementia with some acute encephalopathy -Continue on sertraline with donepezil held -CT head negative on admission, brain MRI ordered and pending when patient can lay flat -EEG pending -Appreciate neurology evaluation -Repeat lab work in a.m. -Gentle IV hydration  AKI on CKD stage III -Discontinue nephrotoxic agents -Continue IV fluid -Recheck renal panel in a.m. -Strict I's and O's  Type 2 diabetes-stable -Discontinue glipizide on discharge -We will plan to start Tradjenta 5 mg daily on discharge  Essential hypertension-stable -Currently held on Lasix due to dehydration  Paroxysmal atrial fibrillation -Continue home atenolol with good heart rate control, minimal hypotension noted -No anticoagulation due to frequent falls  Abnormal urine analysis -Urine culture negative for infection -We will consider repeating as needed for persistent encephalopathy if further neurological testing negative   DVT prophylaxis: Lovenox to heparin Code  Status: Full Family Communication: Will talk to family Disposition Plan:  Further evaluation per neurology pending with brain MRI and EEG.  Anticipate discharge to SNF once evaluation completed   Consultants:   Neurology  Procedures:   MRI and EEG pending  Antimicrobials:   None   Subjective: Patient seen and evaluated today with no new acute complaints or concerns. No acute concerns or events noted overnight.  He appears to be more alert and awake and is eating when he has assistance.  He appears to have worsening renal function this morning.  Fluid balance is positive.  Objective: Vitals:   07/11/19 1715 07/11/19 2119 07/11/19 2142 07/12/19 0522  BP: 102/63 (!) 99/53 (!) 96/58 129/86  Pulse: 92 99  94  Resp:  20  20  Temp:  98.9 F (37.2 C)  97.6 F (36.4 C)  TempSrc:  Oral  Oral  SpO2: 91% 92%  95%  Weight:      Height:        Intake/Output Summary (Last 24 hours) at 07/12/2019 1259 Last data filed at 07/12/2019 0500 Gross per 24 hour  Intake 1333.35 ml  Output 850 ml  Net 483.35 ml   Filed Weights   07/08/19 1011 07/08/19 2121  Weight: 116 kg 112.1 kg    Examination:  General exam: Appears calm and comfortable  Respiratory system: Clear to auscultation. Respiratory effort normal. Cardiovascular system: S1 & S2 heard, RRR. No JVD, murmurs, rubs, gallops or clicks. No pedal edema. Gastrointestinal system: Abdomen is nondistended, soft and nontender. No organomegaly or masses felt. Normal bowel sounds heard. Central nervous system: Alert and awake, but cannot verbalize Extremities: No significant edema Skin: No rashes, lesions or ulcers Psychiatry: Cannot be appropriately assessed    Data Reviewed: I have personally  reviewed following labs and imaging studies  CBC: Recent Labs  Lab 07/08/19 1039 07/09/19 0915 07/10/19 0600 07/12/19 0438  WBC 5.8 5.5 5.7 5.3  NEUTROABS 3.5  --   --   --   HGB 11.3* 12.0* 12.6* 10.8*  HCT 35.8* 38.1* 40.2  33.5*  MCV 100.0 99.7 98.8 98.8  PLT 192 196 194 284   Basic Metabolic Panel: Recent Labs  Lab 07/08/19 1039 07/09/19 0915 07/10/19 0600 07/12/19 0438  NA 140 139 140 139  K 5.1 4.9 4.3 4.1  CL 105 105 107 108  CO2 27 25 22 25   GLUCOSE 123* 166* 146* 116*  BUN 15 15 16  25*  CREATININE 1.58* 1.55* 1.41* 1.81*  CALCIUM 8.6* 8.5* 8.7* 8.1*   GFR: Estimated Creatinine Clearance: 36.2 mL/min (A) (by C-G formula based on SCr of 1.81 mg/dL (H)). Liver Function Tests: Recent Labs  Lab 07/08/19 1039  AST 25  ALT 14  ALKPHOS 65  BILITOT 1.0  PROT 7.8  ALBUMIN 3.2*   No results for input(s): LIPASE, AMYLASE in the last 168 hours. Recent Labs  Lab 07/11/19 1305  AMMONIA 14   Coagulation Profile: No results for input(s): INR, PROTIME in the last 168 hours. Cardiac Enzymes: Recent Labs  Lab 07/08/19 1039  CKTOTAL 59   BNP (last 3 results) No results for input(s): PROBNP in the last 8760 hours. HbA1C: No results for input(s): HGBA1C in the last 72 hours. CBG: Recent Labs  Lab 07/11/19 1704 07/11/19 2014 07/11/19 2118 07/12/19 0743 07/12/19 1147  GLUCAP 163* 183* 183* 122* 163*   Lipid Profile: No results for input(s): CHOL, HDL, LDLCALC, TRIG, CHOLHDL, LDLDIRECT in the last 72 hours. Thyroid Function Tests: Recent Labs    07/11/19 1305  TSH 0.400   Anemia Panel: No results for input(s): VITAMINB12, FOLATE, FERRITIN, TIBC, IRON, RETICCTPCT in the last 72 hours. Sepsis Labs: Recent Labs  Lab 07/08/19 1039 07/08/19 1240  LATICACIDVEN 0.8 0.7    Recent Results (from the past 240 hour(s))  SARS CORONAVIRUS 2 Nasal Swab Aptima Multi Swab     Status: None   Collection Time: 07/08/19  2:51 PM   Specimen: Aptima Multi Swab; Nasal Swab  Result Value Ref Range Status   SARS Coronavirus 2 NEGATIVE NEGATIVE Final    Comment: (NOTE) SARS-CoV-2 target nucleic acids are NOT DETECTED. The SARS-CoV-2 RNA is generally detectable in upper and lower respiratory  specimens during the acute phase of infection. Negative results do not preclude SARS-CoV-2 infection, do not rule out co-infections with other pathogens, and should not be used as the sole basis for treatment or other patient management decisions. Negative results must be combined with clinical observations, patient history, and epidemiological information. The expected result is Negative. Fact Sheet for Patients: SugarRoll.be Fact Sheet for Healthcare Providers: https://www.woods-mathews.com/ This test is not yet approved or cleared by the [place] FDA and  has been authorized for detection and/or diagnosis of SARS-CoV-2 by FDA under an Emergency Use Authorization (EUA). This EUA will remain  in effect (meaning this test can be used) for the duration of the COVID-19 declaration under Section 56 4(b)(1) of the Act, 21 U.S.C. section 360bbb-3(b)(1), unless the authorization is terminated or revoked sooner. Performed at [facility], [place] [address] [place], [place] [address]   Urine culture     Status: None   Collection Time: 07/08/19  3:12 PM   Specimen: Urine, Random  Result Value Ref Range Status   Specimen Description  Final    URINE, RANDOM Performed at [facility], [address], [place], [place] [address]    Special Requests   Final    NONE Performed at [facility], [address] [place], [place] [address]    Culture   Final    NO GROWTH Performed at [facility], [place] [address], [place], [place] [address]    Report Status 07/09/2019 FINAL  Final  Novel Coronavirus, NAA (hospital order; send-out to ref lab)     Status: None   Collection Time: 07/10/19 12:19 PM   Specimen: Nasopharyngeal Swab; Respiratory  Result Value Ref Range Status   SARS-CoV-2, NAA NOT DETECTED NOT DETECTED Final    Comment: (NOTE) This test was developed and its performance characteristics determined by [employer]. This  test has not been FDA cleared or approved. This test has been authorized by FDA under an Emergency Use Authorization (EUA). This test is only authorized for the duration of time the declaration that circumstances exist justifying the authorization of the emergency use of in vitro diagnostic tests for detection of SARS-CoV-2 virus and/or diagnosis of COVID-19 infection under section 564(b)(1) of the Act, 21 U.S.C. 175ZWC-5(E)(5), unless the authorization is terminated or revoked sooner. When diagnostic testing is negative, the possibility of a false negative result should be considered in the context of a patient's recent exposures and the presence of clinical signs and symptoms consistent with COVID-19. An individual without symptoms of COVID-19 and who is not shedding SARS-CoV-2 virus would expect to have a negative (not detected) result in this assay. Performed  At: [facility] [place], [place] [id number] Rush Farmer MD TI:[id number]    [place]  Final    Comment: Performed at [facility], [address], [place], [place] [address]         Radiology Studies: Dg Chest [facility] 1 View  Result Date: 07/11/2019 CLINICAL DATA:  85 year old male with hypoxia and cough. EXAM: PORTABLE CHEST 1 VIEW COMPARISON:  Chest radiograph dated 07/08/2019 and CT dated 09/23/2018 FINDINGS: Shallow inspiration with bibasilar atelectasis. There is mild cardiomegaly with mild vascular congestion. There is no focal consolidation, pleural effusion, or pneumothorax. Atherosclerotic calcification of the aorta. No acute osseous pathology. IMPRESSION: Cardiomegaly with mild vascular congestion. No focal consolidation. Electronically Signed   By: Anner Crete M.D.   On: 07/11/2019 22:21        Scheduled Meds: . atenolol  25 mg Oral Daily  . heparin injection (subcutaneous)  5,000 Units Subcutaneous Q8H  . insulin aspart  0-9 Units Subcutaneous TID WC   . insulin glargine  5 Units Subcutaneous q morning - 10a  . pantoprazole  40 mg Oral Daily  . tamsulosin  0.4 mg Oral Daily   Continuous Infusions: . lactated ringers       LOS: 3 days    Time spent: 30 minutes    Corrinne Benegas Darleen Crocker, DO [facility] Pager [phone]  If 7PM-7AM, please contact night-coverage [email or web address] Password [facility] 07/12/2019, 12:59 PM

## 2019-07-12 NOTE — Plan of Care (Signed)
  Problem: Education: Goal: Knowledge of General Education information will improve Description Including pain rating scale, medication(s)/side effects and non-pharmacologic comfort measures Outcome: Progressing   Problem: Health Behavior/Discharge Planning: Goal: Ability to manage health-related needs will improve Outcome: Progressing   

## 2019-07-12 NOTE — Progress Notes (Signed)
Physical Therapy Treatment Patient Details Name: Samuel Ryan MRN: [id number] DOB: December 13, 1930 Today's Date: 07/12/2019    History of Present Illness Samuel Ryan is a 85 y.o. male with medical history significant for diabetes mellitus, dementia, hypertension, BPH, CKD 3, atrial fibrillation, interstitial fibrosis and DVT, patient was brought in by EMS with reports of a fall earlier today.  Patient is awake and alert able to give me a little bit of history and answer simple questions.  Patient lives with his son who works and they have a Actuary who comes into the house for about 4 hours every day.  Patient was unable to get up onto his sitter got there about 4 hours later.Patient has an abrasion to the right upper side of his head, he tells me he fell 2 days ago also, sustaining that bruise.  He denies chest pain or difficulty breathing, he denies cough.  He is unsure why he fell.  He denies urinary symptoms.    PT Comments    Pt a lot more alert today.  Able to tell name, DOB and aware he is in hospital though thinks he is [place].  Pt required some encouragement to complete therapy from son and education on benefits from therapist.  Pt mod A with bed mobility, cueing for handrails to assist with rolling then sitting on EOB.  Cueing for handplacement on bed to assist with scooting to EOB with some assistance required.  Seated therex complete on EOB.  Max A with 2+ for safety with sit to stand, elevated bed height for ease due to LE weakness.  During standing cathader fell off, NT aware.  Pt left in chair with hoyer lift seat for assistance back to bed, call bell within reach and chair alarm set while son out of room.  No reoprts of pain, was limited by fatigue.    Follow Up Recommendations  SNF;Supervision for mobility/OOB;Supervision/Assistance - 24 hour     Equipment Recommendations  None recommended by PT    Recommendations for Other Services       Precautions / Restrictions  Precautions Precautions: Fall Restrictions Weight Bearing Restrictions: No    Mobility  Bed Mobility Overal bed mobility: Needs Assistance       Supine to sit: Mod assist     General bed mobility comments: slow labored movenet, delayed follow cueing for hand placement on rail to assist with rolling for supine to sit  Transfers Overall transfer level: Needs assistance Equipment used: Rolling walker (2 wheeled) Transfers: Sit to/from Stand Sit to Stand: Mod assist         General transfer comment: difficulty for sit to stands due to BLE weakness, elevated bed height  Ambulation/Gait Ambulation/Gait assistance: +2 physical assistance;Mod assist;Max assist Gait Distance (Feet): 4 Feet Assistive device: Rolling walker (2 wheeled) Gait Pattern/deviations: Decreased step length - right;Decreased step length - left;Decreased stride length Gait velocity: slow   General Gait Details: limited to 8-10 slow unsteady labored steps at bedside due to generalized weakness and mild SOB, difficulty side stepping, verbal cues to move R LE   Stairs             Wheelchair Mobility    Modified Rankin (Stroke Patients Only)       Balance  Cognition Arousal/Alertness: Awake/alert Behavior During Therapy: WFL for tasks assessed/performed(Required some encouragement to participate from son) Overall Cognitive Status: History of cognitive impairments - at baseline                                        Exercises General Exercises - Lower Extremity Long Arc Quad: AROM;Both;Strengthening;10 reps;Seated Hip Flexion/Marching: Seated;AROM;Strengthening;Both;10 reps Toe Raises: 10 reps Heel Raises: 10 reps;Both;Strengthening;AROM;Seated    General Comments        Pertinent Vitals/Pain Pain Assessment: No/denies pain    Home Living                      Prior Function            PT  Goals (current goals can now be found in the care plan section)      Frequency    Min 3X/week      PT Plan Current plan remains appropriate    Co-evaluation              AM-PAC PT "6 Clicks" Mobility   Outcome Measure  Help needed turning from your back to your side while in a flat bed without using bedrails?: A Little Help needed moving from lying on your back to sitting on the side of a flat bed without using bedrails?: A Little Help needed moving to and from a bed to a chair (including a wheelchair)?: A Lot Help needed standing up from a chair using your arms (e.g., wheelchair or bedside chair)?: A Lot Help needed to walk in hospital room?: Total Help needed climbing 3-5 steps with a railing? : Total 6 Click Score: 12    End of Session Equipment Utilized During Treatment: Gait belt Activity Tolerance: Patient tolerated treatment well Patient left: in chair;with call bell/phone within reach;with family/visitor present(hoyer lift) Nurse Communication: Mobility status PT Visit Diagnosis: Unsteadiness on feet (R26.81);Other abnormalities of gait and mobility (R26.89);Muscle weakness (generalized) (M62.81)     Time: 5456-2563 PT Time Calculation (min) (ACUTE ONLY): 36 min  Charges:  $Therapeutic Exercise: 8-22 mins $Therapeutic Activity: 8-22 mins                    [address], LPTA; CBIS [phone]  Aldona Lento 07/12/2019, 6:25 PM

## 2019-07-12 NOTE — Procedures (Signed)
  [name], MD     [email or web address]           HISTORY: The patient 85 year old who presents with altered mental status and confusion concerning for complex partial seizures.  MEDICATIONS:  Current Facility-Administered Medications:  .  acetaminophen (TYLENOL) tablet 650 mg, 650 mg, Oral, Q6H PRN, 650 mg at 07/08/19 2118 **OR** acetaminophen (TYLENOL) suppository 650 mg, 650 mg, Rectal, Q6H PRN, Emokpae, Ejiroghene E, MD .  atenolol (TENORMIN) tablet 25 mg, 25 mg, Oral, Daily, Emokpae, Ejiroghene E, MD, 25 mg at 07/11/19 1749 .  heparin injection 5,000 Units, 5,000 Units, Subcutaneous, Q8H, Shah, Pratik D, DO, 5,000 Units at 07/12/19 1436 .  hydrALAZINE (APRESOLINE) injection 10 mg, 10 mg, Intravenous, Q4H PRN, Johnson, Clanford L, MD .  insulin aspart (novoLOG) injection 0-9 Units, 0-9 Units, Subcutaneous, TID WC, Emokpae, Ejiroghene E, MD, 2 Units at 07/12/19 1308 .  insulin glargine (LANTUS) injection 5 Units, 5 Units, Subcutaneous, q morning - 10a, Heath Lark D, DO, 5 Units at 07/12/19 0959 .  lactated ringers infusion, , Intravenous, Continuous, Manuella Ghazi, Pratik D, DO, Last Rate: 100 mL/hr at 07/12/19 1441 .  ondansetron (ZOFRAN) tablet 4 mg, 4 mg, Oral, Q6H PRN **OR** ondansetron (ZOFRAN) injection 4 mg, 4 mg, Intravenous, Q6H PRN, Emokpae, Ejiroghene E, MD .  pantoprazole (PROTONIX) EC tablet 40 mg, 40 mg, Oral, Daily, Emokpae, Ejiroghene E, MD, 40 mg at 07/11/19 2038 .  tamsulosin (FLOMAX) capsule 0.4 mg, 0.4 mg, Oral, Daily, Emokpae, Ejiroghene E, MD, 0.4 mg at 07/11/19 2038  Facility-Administered Medications Ordered in Other Encounters:  .  botulinum toxin Type A (BOTOX) injection 100 Units, 100 Units, Intramuscular, Once, McKenzie, [name], MD     ANALYSIS: A 16 channel recording using standard 10 20 measurements is conducted for 25 minutes.   The background activity gets as high as 7-8 hertz.  There is normal beta activity observed in the  frontal areas.  Awake and drowsy architecture are observed.  There is normal myogenic interference noted.  Photic stimulation and hyperventilation are not conducted.  There is no focal or lateralized slowing.  There is no epileptiform activity is noted.   IMPRESSION: 1.  This recording of awake and drowsy states shows mild global slowing indicating a mild global encephalopathy.  However, no epileptiform discharges are observed.      Sanyia Dini A. Merlene Laughter, M.D.  Diplomate, Tax adviser of Psychiatry and Neurology ( Neurology).

## 2019-07-12 NOTE — Progress Notes (Signed)
EEG complete - results pending 

## 2019-07-12 NOTE — Consult Note (Signed)
Opal A. Merlene Laughter, MD     [email or web address]          Samuel Ryan is an 85 y.o. male.   ASSESSMENT/PLAN: 1. Acute episode of syncope/ loss of consciousness of unclear etiology.  Potential etiologies includes medication effect, orthostatic hypertension and seizures.   An EEG will be obtained 2.  Altered mental status likely due to acute medical illness. 3.  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  Additional imaging will be obtained with MRI. 4.  Baseline gait impairment multifactorial 5.  Dementia at baseline    The patient is an 85 year old black male who has a baseline history of gait impairment and cognitive impairment.  He typically ambulates with the walker.  He has a nurse that comes in daily during the week and sits with him for about 4 hours.  The history is obtained from the patient and his son who is at the bedside.  Apparently the patient went to take a bath which she does do by item self.  After he completed taking a bath and when leaving the bathroom, he apparently blacked out.  The patient specifically report that he lost consciousness and found himself on the ground.  He sustained head injuries to the right.  No clear evidence of tonic-clonic activities are noted.  The patient does not report new focal neurological deficits.  There are no reports of chest pain or shortness of breath.  The patient has been confused off and on since being in the hospital.  The son seems to think that the confusion seems to get worse whenever he is dehydrated.  Apparently the son thinks that when he is on IV fluids, his confusion improved.  Patient seems to have significant pain especially involving the right upper extremity radicular involving the shoulder and the wrist.  The son reports this is a chronic issue.  He patient seemed not to have any additional complaints at this time.  He has been eating his food for  the most part while being on the hospital.  The son reports that he has been drowsy often on.  The review of systems is limited but otherwise unrevealing.      GENERAL:  This is a moderately overweight pleasant male who appears to be in significant discomfort but in no acute distress.  HEENT:   There is a moderate-sized approximately 3 inch abrasion right frontal region.  There is mild ptosis on the left and mild flattening of the nasal labial fold also on the left side.  ABDOMEN: Soft  EXTREMITIES: No edema;  There is scaling and some burning of the distal scan of the legs bilaterally. There is severe arthritic changes noted throughout especially of the knees.  There is significant pain with manipulation of the right shoulder and right wrist.   BACK: Normal alignment.  SKIN: Normal by inspection.    MENTAL STATUS:   The patient is awake and alert.  He appears to does of a few times during the evaluation but this mostly oriented.  He knows his 2020 and that he is in the hospital.  He is mostly lucid other than for being quite drowsy.  No dysarthria is a appreciated.   CRANIAL NERVES: Pupils are equal, round and reactive to light and accommodation; extraocular movements are full, there is no significant nystagmus; upper and lower facial muscles are normal in strength and symmetric other than already mentioned above, there is no  flattening of the nasolabial folds; tongue is midline; uvula is midline; shoulder elevation is normal.  MOTOR:  Right upper extremity is weak graded as 4-/ 5.  Left upper extremity 4+.  Legs are 3/5 bilaterally proximally but distally 5.  COORDINATION: Left finger to nose is normal, right finger to nose is normal, No rest tremor; no intention tremor; no postural tremor; no bradykinesia.  REFLEXES:   The deep tendon reflexes are absent at the right knee and slightly diminished in the legs did generally 1+.  SENSATION: Normal to light touch, pain and  temperature.    Blood pressure 129/86, pulse 94, temperature 97.6 F (36.4 C), temperature source Oral, resp. rate 20, height 5\' 9"  (1.753 m), weight 112.1 kg, SpO2 95 %.  Past Medical History:  Diagnosis Date   Acute cholecystitis 11/2012   Drained percutaneously   Anxiety    BPH (benign prostatic hypertrophy)    Deep venous thrombosis (HCC)    Left leg, diagnosed 7/12   Dementia ([place])    Needs supervision for safety per son - POA   Diverticulitis    Essential hypertension    GERD (gastroesophageal reflux disease)    History of pneumonia    Prior to 2012   Incontinence of urine    Mixed hyperlipidemia    Osteoarthritis    Pulmonary embolism ([place])    Diagnosed 7/12   Type 2 diabetes mellitus ([place])     Past Surgical History:  Procedure Laterality Date   CARPAL TUNNEL RELEASE     COLONOSCOPY  08/31/2012   Procedure: COLONOSCOPY;  Surgeon: Rogene Houston, MD;  [facility];  Service: Endoscopy;  Laterality: N/A;  [place] N/A 05/15/2018   Procedure: CYSTOSCOPY WITH BOTOX INJECTION;  Surgeon: Cleon Gustin, MD;  Location: [facility];  Service: Urology;  Laterality: N/A;   CYSTOSCOPY WITH INJECTION N/A 11/10/2018   Procedure: CYSTOSCOPY WITH BOTOX  INJECTION;  Surgeon: Cleon Gustin, MD;  Location: [facility];  Service: Urology;  Laterality: N/A;  [place] N/A 09/02/2016   Procedure: CYSTOSCOPY WITH INSERTION OF UROLIFT;  Surgeon: Cleon Gustin, MD;  Location: [facility];  Service: Urology;  Laterality: N/A;   EYE SURGERY Bilateral    bilateral cataract   Right total knee replacement  04/23/2011   SPINE SURGERY  prior to 2012   Neck fusion   UMBILICAL HERNIA REPAIR     umbilical    Family History  Problem Relation Age of Onset   Cancer Mother        Stomach   Diabetes Sister    Hypertension Sister    Diabetes Brother    Cancer Brother         Gallbladder   Seizures Son        due to meningitis as a child    Social History:  reports that he has never smoked. He has never used smokeless tobacco. He reports that he does not drink alcohol or use drugs.  Allergies:  Allergies  Allergen Reactions   Zosyn [Piperacillin Sod-Tazobactam So] Anaphylaxis, Swelling and Other (See Comments)    Facial swelling Has patient had a PCN reaction causing immediate rash, facial/tongue/throat swelling, SOB or lightheadedness with hypotension: Yes Has patient had a PCN reaction causing severe rash involving mucus membranes or skin necrosis: No Has patient had a PCN reaction that required hospitalization: No Has patient had a PCN reaction occurring within  the last 10 years: Yes If all of the above answers are "NO", then may proceed with Cephalosporin use.    Ace Inhibitors Cough   Vancomycin Swelling    Facial swelling    Medications: Prior to Admission medications   Medication Sig Start Date End Date Taking? Authorizing Provider  acetaminophen (TYLENOL) 500 MG tablet Take 1,000 mg by mouth every 6 (six) hours as needed for mild pain, moderate pain or headache.    Yes [provider]  atenolol (TENORMIN) 25 MG tablet Take 25 mg by mouth daily.   Yes [provider]  clobetasol cream (TEMOVATE) 6.64 % APPLY 1 APPLICATION TOPICALLY 2 (TWO) TIMES DAILY. 04/13/19  Yes [name], MD  donepezil (ARICEPT) 10 MG tablet TAKE 1 TABLET AT BEDTIME 05/17/19  Yes [name], MD  Ferrous Sulfate (IRON) 325 (65 Fe) MG TABS Take 325 mg by mouth daily.    Yes [provider]  furosemide (LASIX) 20 MG tablet Take 1 tablet by mouth daily. 02/24/19  Yes [provider]  glipiZIDE (GLUCOTROL XL) 10 MG 24 hr tablet Take 1 tablet (10 mg total) by mouth daily with breakfast. 07/02/19  Yes [name], MD  Insulin Glargine (LANTUS SOLOSTAR) 100 UNIT/ML Solostar Pen Inject 23 Units into the skin every morning.  HOLD for blood sugar levels below 100 Patient taking differently: Inject 20 Units into the skin every morning. HOLD for blood sugar levels below 100 05/31/19  Yes [name], MD  loratadine (CLARITIN) 10 MG tablet Take 10 mg by mouth every morning.    Yes [provider]  lovastatin (MEVACOR) 40 MG tablet TAKE 1 TABLET AT BEDTIME 02/26/19  Yes [name], MD  omeprazole (PRILOSEC) 40 MG capsule TAKE 1 CAPSULE TWICE DAILY 06/17/19  Yes Perlie Mayo, NP  sertraline (ZOLOFT) 50 MG tablet Take 1 tablet (50 mg total) by mouth daily. 01/22/19  Yes [name], MD  tamsulosin (FLOMAX) 0.4 MG CAPS capsule TAKE 1 CAPSULE EVERY DAY 06/17/19  Yes Perlie Mayo, NP  traMADol Veatrice Bourbon) 50 MG tablet Take one tablet by mouth two times daily for chronic joint pain 07/02/19  Yes [name], MD  Trospium Chloride 60 MG CP24 Take 60 mg by mouth daily.  06/02/17  Yes [provider]  Vitamin D, Ergocalciferol, (DRISDOL) 1.25 MG (50000 UT) CAPS capsule Take 1 capsule (50,000 Units total) by mouth every 7 (seven) days. 07/02/19  Yes [name], MD    Scheduled Meds:  atenolol  25 mg Oral Daily   enoxaparin (LOVENOX) injection  60 mg Subcutaneous Q24H   insulin aspart  0-9 Units Subcutaneous TID WC   insulin glargine  5 Units Subcutaneous q morning - 10a   pantoprazole  40 mg Oral Daily   tamsulosin  0.4 mg Oral Daily   Continuous Infusions: PRN Meds:.acetaminophen **OR** acetaminophen, hydrALAZINE, ondansetron **OR** ondansetron (ZOFRAN) IV     Results for orders placed or performed during the hospital encounter of 07/08/19 (from the past 48 hour(s))  Glucose, capillary     Status: Abnormal   Collection Time: 07/10/19 11:23 AM  Result Value Ref Range   Glucose-Capillary 153 (H) 70 - 99 mg/dL  Novel Coronavirus, NAA (hospital order; send-out to ref lab)     Status: None   Collection Time: 07/10/19 12:19 PM   Specimen: Nasopharyngeal Swab;  Respiratory  Result Value Ref Range   SARS-CoV-2, NAA NOT DETECTED NOT DETECTED  Comment: (NOTE) This test was developed and its performance characteristics determined by [employer]. This test has not been FDA cleared or approved. This test has been authorized by FDA under an Emergency Use Authorization (EUA). This test is only authorized for the duration of time the declaration that circumstances exist justifying the authorization of the emergency use of in vitro diagnostic tests for detection of SARS-CoV-2 virus and/or diagnosis of COVID-19 infection under section 564(b)(1) of the Act, 21 U.S.C. 119ERD-4(Y)(8), unless the authorization is terminated or revoked sooner. When diagnostic testing is negative, the possibility of a false negative result should be considered in the context of a patient's recent exposures and the presence of clinical signs and symptoms consistent with COVID-19. An individual without symptoms of COVID-19 and who is not shedding SARS-CoV-2 virus would expect to have a negative (not detected) result in this assay. Performed  At: [facility] [address] [place], [place] [id number] Rush Farmer MD JS:[id number]    [place]     Comment: Performed at [facility], [address] [place], [place] [address]  Glucose, capillary     Status: Abnormal   Collection Time: 07/10/19  4:11 PM  Result Value Ref Range   Glucose-Capillary 132 (H) 70 - 99 mg/dL  Glucose, capillary     Status: Abnormal   Collection Time: 07/10/19  9:33 PM  Result Value Ref Range   Glucose-Capillary 241 (H) 70 - 99 mg/dL  Glucose, capillary     Status: Abnormal   Collection Time: 07/11/19  7:27 AM  Result Value Ref Range   Glucose-Capillary 134 (H) 70 - 99 mg/dL  Glucose, capillary     Status: Abnormal   Collection Time: 07/11/19 11:46 AM  Result Value Ref Range   Glucose-Capillary 120 (H) 70 - 99 mg/dL  TSH     Status: None   Collection  Time: 07/11/19  1:05 PM  Result Value Ref Range   TSH 0.400 0.350 - 4.500 uIU/mL    Comment: Performed by a 3rd Generation assay with a functional sensitivity of <=0.01 uIU/mL. Performed at [facility], [address] [place], [place] [address]   Ammonia     Status: None   Collection Time: 07/11/19  1:05 PM  Result Value Ref Range   Ammonia 14 9 - 35 umol/L    Comment: Performed at [facility], [address] [place], [place] [address]  Glucose, capillary     Status: Abnormal   Collection Time: 07/11/19  5:04 PM  Result Value Ref Range   Glucose-Capillary 163 (H) 70 - 99 mg/dL   Comment 1 Notify RN    Comment 2 Document in Chart   Glucose, capillary     Status: Abnormal   Collection Time: 07/11/19  8:14 PM  Result Value Ref Range   Glucose-Capillary 183 (H) 70 - 99 mg/dL  Glucose, capillary     Status: Abnormal   Collection Time: 07/11/19  9:18 PM  Result Value Ref Range   Glucose-Capillary 183 (H) 70 - 99 mg/dL  CBC     Status: Abnormal   Collection Time: 07/12/19  4:38 AM  Result Value Ref Range   WBC 5.3 4.0 - 10.5 K/uL   RBC 3.39 (L) 4.22 - 5.81 MIL/uL   Hemoglobin 10.8 (L) 13.0 - 17.0 g/dL   HCT 33.5 (L) 39.0 - 52.0 %   MCV 98.8 80.0 - 100.0 fL   MCH 31.9 26.0 - 34.0 pg   MCHC 32.2 30.0 - 36.0  g/dL   RDW 14.3 11.5 - 15.5 %   Platelets 188 150 - 400 K/uL   nRBC 0.0 0.0 - 0.2 %    Comment: Performed at [facility], [address] [place], [place] [address]  Basic metabolic panel     Status: Abnormal   Collection Time: 07/12/19  4:38 AM  Result Value Ref Range   Sodium 139 135 - 145 mmol/L   Potassium 4.1 3.5 - 5.1 mmol/L   Chloride 108 98 - 111 mmol/L   CO2 25 22 - 32 mmol/L   Glucose, Bld 116 (H) 70 - 99 mg/dL   BUN 25 (H) 8 - 23 mg/dL   Creatinine, Ser 1.81 (H) 0.61 - 1.24 mg/dL   Calcium 8.1 (L) 8.9 - 10.3 mg/dL   GFR calc non Af Amer 33 (L) >60 mL/min   GFR calc Af Amer 38 (L) >60 mL/min   Anion gap 6 5 - 15    Comment: Performed at [facility]  [address], [place], [place] [address]  Brain natriuretic peptide     Status: None   Collection Time: 07/12/19  4:38 AM  Result Value Ref Range   B Natriuretic Peptide 46.0 0.0 - 100.0 pg/mL    Comment: Performed at [facility], [address], [place], [place] [address]  Glucose, capillary     Status: Abnormal   Collection Time: 07/12/19  7:43 AM  Result Value Ref Range   Glucose-Capillary 122 (H) 70 - 99 mg/dL  Blood gas, arterial     Status: Abnormal   Collection Time: 07/12/19  8:09 AM  Result Value Ref Range   FIO2 28.00    pH, Arterial 7.397 7.350 - 7.450   pCO2 arterial 43.3 32.0 - 48.0 mmHg   pO2, Arterial 72.9 (L) 83.0 - 108.0 mmHg   Bicarbonate 25.7 20.0 - 28.0 mmol/L   Acid-Base Excess 1.7 0.0 - 2.0 mmol/L   O2 Saturation 93.6 %   Patient temperature 37.0    [place] test (pass/fail) PASS PASS    Comment: Performed at [facility], [address], [place], [place] [address]    Studies/Results:   HEAD CT FINDINGS: CT HEAD FINDINGS  Brain: Cerebral atrophy. Patchy and confluent areas of decreased attenuation are noted throughout the deep and periventricular white matter of the cerebral hemispheres bilaterally, compatible with chronic microvascular ischemic disease. Physiologic calcifications in the basal ganglia bilaterally incidentally noted. No evidence of acute infarction, hemorrhage, hydrocephalus, extra-axial collection or mass lesion/mass effect.  Vascular: No hyperdense vessel or unexpected calcification.  Skull: Normal. Negative for fracture or focal lesion.  Sinuses/Orbits: No acute finding.  Other: None.  CT CERVICAL SPINE FINDINGS  Alignment: Straightening of normal cervical lordosis, presumably positional. Alignment is otherwise anatomic.  Skull base and vertebrae: Status post ACDF from C4-C6 with interbody grafts at C4-C5 and C5-C6. No acute fracture. No primary bone lesion or focal pathologic process.  Soft tissues and spinal canal: No  prevertebral fluid or swelling. No visible canal hematoma.  Disc levels: Severe multilevel degenerative disc disease, most pronounced at C3-C4. Moderate multilevel facet arthropathy.  Upper chest: Negative.  Other: There are no aggressive appearing lytic or blastic lesions noted in the visualized portions of the skeleton.  IMPRESSION: 1. No evidence of significant acute traumatic injury to the skull, brain or cervical spine. 2. Mild cerebral atrophy with extensive chronic microvascular ischemic changes in the cerebral white matter, as above. 3. Status post ACDF at C4-C6 with multilevel degenerative disc disease and  cervical spondylosis, as above.    Head CT is reviewed in person.  There is moderate global atrophy.  There is severe reduced signal/hypodensity involving the deep white matter periventricular areas consistent with severe leukoencephalopathy.  Nothing acute is seen.  Samuel Ryan, M.D.  Diplomate, Tax adviser of Psychiatry and Neurology ( Neurology). 07/12/2019, 9:58 AM

## 2019-07-13 ENCOUNTER — Inpatient Hospital Stay
Admission: RE | Admit: 2019-07-13 | Discharge: 2019-11-30 | Disposition: E | Payer: Medicare HMO | Source: Ambulatory Visit | Attending: Internal Medicine | Admitting: Internal Medicine

## 2019-07-13 DIAGNOSIS — IMO0002 Reserved for concepts with insufficient information to code with codable children: Secondary | ICD-10-CM

## 2019-07-13 DIAGNOSIS — E1121 Type 2 diabetes mellitus with diabetic nephropathy: Secondary | ICD-10-CM

## 2019-07-13 DIAGNOSIS — F0390 Unspecified dementia without behavioral disturbance: Secondary | ICD-10-CM | POA: Diagnosis not present

## 2019-07-13 DIAGNOSIS — M159 Polyosteoarthritis, unspecified: Secondary | ICD-10-CM | POA: Diagnosis not present

## 2019-07-13 DIAGNOSIS — E1129 Type 2 diabetes mellitus with other diabetic kidney complication: Secondary | ICD-10-CM | POA: Diagnosis not present

## 2019-07-13 DIAGNOSIS — J8410 Pulmonary fibrosis, unspecified: Secondary | ICD-10-CM | POA: Diagnosis not present

## 2019-07-13 DIAGNOSIS — E1169 Type 2 diabetes mellitus with other specified complication: Secondary | ICD-10-CM | POA: Diagnosis not present

## 2019-07-13 DIAGNOSIS — N183 Chronic kidney disease, stage 3 (moderate): Secondary | ICD-10-CM | POA: Diagnosis not present

## 2019-07-13 DIAGNOSIS — N1832 Chronic kidney disease, stage 3b: Secondary | ICD-10-CM

## 2019-07-13 DIAGNOSIS — R0902 Hypoxemia: Secondary | ICD-10-CM | POA: Diagnosis not present

## 2019-07-13 DIAGNOSIS — G308 Other Alzheimer's disease: Secondary | ICD-10-CM | POA: Diagnosis not present

## 2019-07-13 DIAGNOSIS — R5381 Other malaise: Secondary | ICD-10-CM | POA: Diagnosis not present

## 2019-07-13 DIAGNOSIS — G301 Alzheimer's disease with late onset: Secondary | ICD-10-CM | POA: Diagnosis not present

## 2019-07-13 DIAGNOSIS — F339 Major depressive disorder, recurrent, unspecified: Secondary | ICD-10-CM | POA: Diagnosis not present

## 2019-07-13 DIAGNOSIS — F329 Major depressive disorder, single episode, unspecified: Secondary | ICD-10-CM | POA: Diagnosis not present

## 2019-07-13 DIAGNOSIS — J3089 Other allergic rhinitis: Secondary | ICD-10-CM | POA: Diagnosis not present

## 2019-07-13 DIAGNOSIS — R338 Other retention of urine: Secondary | ICD-10-CM | POA: Diagnosis not present

## 2019-07-13 DIAGNOSIS — Z9181 History of falling: Secondary | ICD-10-CM | POA: Diagnosis not present

## 2019-07-13 DIAGNOSIS — E1165 Type 2 diabetes mellitus with hyperglycemia: Secondary | ICD-10-CM | POA: Diagnosis not present

## 2019-07-13 DIAGNOSIS — I4811 Longstanding persistent atrial fibrillation: Secondary | ICD-10-CM | POA: Diagnosis not present

## 2019-07-13 DIAGNOSIS — I129 Hypertensive chronic kidney disease with stage 1 through stage 4 chronic kidney disease, or unspecified chronic kidney disease: Secondary | ICD-10-CM | POA: Diagnosis not present

## 2019-07-13 DIAGNOSIS — E785 Hyperlipidemia, unspecified: Secondary | ICD-10-CM | POA: Diagnosis not present

## 2019-07-13 DIAGNOSIS — E1122 Type 2 diabetes mellitus with diabetic chronic kidney disease: Secondary | ICD-10-CM | POA: Diagnosis not present

## 2019-07-13 DIAGNOSIS — R062 Wheezing: Principal | ICD-10-CM

## 2019-07-13 DIAGNOSIS — K219 Gastro-esophageal reflux disease without esophagitis: Secondary | ICD-10-CM | POA: Diagnosis not present

## 2019-07-13 DIAGNOSIS — U071 COVID-19: Secondary | ICD-10-CM

## 2019-07-13 DIAGNOSIS — F0280 Dementia in other diseases classified elsewhere without behavioral disturbance: Secondary | ICD-10-CM | POA: Diagnosis not present

## 2019-07-13 DIAGNOSIS — R5383 Other fatigue: Secondary | ICD-10-CM

## 2019-07-13 DIAGNOSIS — R262 Difficulty in walking, not elsewhere classified: Secondary | ICD-10-CM | POA: Diagnosis not present

## 2019-07-13 DIAGNOSIS — N401 Enlarged prostate with lower urinary tract symptoms: Secondary | ICD-10-CM | POA: Diagnosis not present

## 2019-07-13 DIAGNOSIS — I10 Essential (primary) hypertension: Secondary | ICD-10-CM

## 2019-07-13 DIAGNOSIS — W19XXXD Unspecified fall, subsequent encounter: Secondary | ICD-10-CM | POA: Diagnosis not present

## 2019-07-13 DIAGNOSIS — B370 Candidal stomatitis: Secondary | ICD-10-CM | POA: Diagnosis not present

## 2019-07-13 DIAGNOSIS — R1310 Dysphagia, unspecified: Secondary | ICD-10-CM | POA: Diagnosis not present

## 2019-07-13 DIAGNOSIS — N179 Acute kidney failure, unspecified: Secondary | ICD-10-CM | POA: Diagnosis not present

## 2019-07-13 DIAGNOSIS — I482 Chronic atrial fibrillation, unspecified: Secondary | ICD-10-CM | POA: Diagnosis not present

## 2019-07-13 DIAGNOSIS — R627 Adult failure to thrive: Secondary | ICD-10-CM | POA: Diagnosis not present

## 2019-07-13 DIAGNOSIS — M6281 Muscle weakness (generalized): Secondary | ICD-10-CM | POA: Diagnosis not present

## 2019-07-13 LAB — CBC
HCT: 34.3 % — ABNORMAL LOW (ref 39.0–52.0)
Hemoglobin: 10.8 g/dL — ABNORMAL LOW (ref 13.0–17.0)
MCH: 31.7 pg (ref 26.0–34.0)
MCHC: 31.5 g/dL (ref 30.0–36.0)
MCV: 100.6 fL — ABNORMAL HIGH (ref 80.0–100.0)
Platelets: 191 10*3/uL (ref 150–400)
RBC: 3.41 MIL/uL — ABNORMAL LOW (ref 4.22–5.81)
RDW: 14.2 % (ref 11.5–15.5)
WBC: 4.6 10*3/uL (ref 4.0–10.5)
nRBC: 0 % (ref 0.0–0.2)

## 2019-07-13 LAB — BASIC METABOLIC PANEL
Anion gap: 7 (ref 5–15)
BUN: 17 mg/dL (ref 8–23)
CO2: 26 mmol/L (ref 22–32)
Calcium: 8.1 mg/dL — ABNORMAL LOW (ref 8.9–10.3)
Chloride: 106 mmol/L (ref 98–111)
Creatinine, Ser: 1.52 mg/dL — ABNORMAL HIGH (ref 0.61–1.24)
GFR calc Af Amer: 47 mL/min — ABNORMAL LOW (ref >60)
GFR calc non Af Amer: 41 mL/min — ABNORMAL LOW (ref >60)
Glucose, Bld: 137 mg/dL — ABNORMAL HIGH (ref 70–99)
Potassium: 4.1 mmol/L (ref 3.5–5.1)
Sodium: 139 mmol/L (ref 135–145)

## 2019-07-13 LAB — GLUCOSE, CAPILLARY
Glucose-Capillary: 119 mg/dL — ABNORMAL HIGH (ref 70–99)
Glucose-Capillary: 202 mg/dL — ABNORMAL HIGH (ref 70–99)

## 2019-07-13 MED ORDER — LINAGLIPTIN 5 MG PO TABS: 5.0000 mg | ORAL_TABLET | Freq: Every day | ORAL | 0 refills | Status: DC

## 2019-07-13 MED ORDER — INSULIN GLARGINE 100 UNIT/ML ~~LOC~~ SOLN: 5.0000 [IU] | Freq: Every morning | SUBCUTANEOUS | 11 refills | Status: DC

## 2019-07-13 NOTE — Plan of Care (Signed)
  Problem: Education: Goal: Knowledge of General Education information will improve Description Including pain rating scale, medication(s)/side effects and non-pharmacologic comfort measures Outcome: Progressing   Problem: Health Behavior/Discharge Planning: Goal: Ability to manage health-related needs will improve Outcome: Progressing   

## 2019-07-13 NOTE — Progress Notes (Signed)
Called report to [place] at [facility]

## 2019-07-13 NOTE — TOC Transition Note (Signed)
Transition of Care [facility]) - CM/SW Discharge Note   Patient Details  Name: Samuel Ryan MRN: [id number] Date of Birth: 04/19/1931  Transition of Care [facility]) CM/SW Contact:  Boneta Lucks, RN Phone Number: 07/10/2019, 1:05 PM   Clinical Narrative:   Patient is now stable and ready for discharge to [facility] [place] a message. Called his son to update him. All clinical sent in the hub.     Final next level of care: Skilled Nursing Facility Barriers to Discharge: Barriers Resolved   Patient Goals and CMS Choice Patient states their goals for this hospitalization and ongoing recovery are:: to go to SNF. CMS Medicare.gov Compare Post Acute Care list provided to:: Patient Represenative (must comment)(SON) Choice offered to / list presented to : Patient  Discharge Placement               [place]   Name of family member notified: Chase Caller - Son Patient and family notified of of transfer: 07/30/2019  Discharge Plan and Services In-house Referral: Clinical Social Work Discharge Planning Services: CM Consult Post Acute Care Choice: [place]                               Social Determinants of Health (SDOH) Interventions     Readmission Risk Interventions Readmission Risk Prevention Plan 07/09/2019  Transportation Screening Complete  PCP or Specialist Appt within 3-5 Days Not Complete  HRI or Home Care Consult Complete  Social Work Consult for [place] Planning/Counseling Complete  Palliative Care Screening Not Complete  Medication Review Press photographer) Complete  Some recent data might be hidden

## 2019-07-13 NOTE — Progress Notes (Signed)
Nsg Discharge Note  Admit Date:  07/08/2019 Discharge date: 07/08/2019   Patsi Sears to be D/C'd Skilled nursing facility per MD order.  AVS completed.  Copy for chart, and copy for patient signed, and dated. Patient/caregiver able to verbalize understanding. Removed IV-clean, dry, intact. Called report to [place] at [facility]. Wheeled stable patient and belongings to tunnel to [facility] where we met NT.  Discharge Medication: Allergies as of 07/12/2019      Reactions   Zosyn [piperacillin Sod-tazobactam So] Anaphylaxis, Swelling, Other (See Comments)   Facial swelling Has patient had a PCN reaction causing immediate rash, facial/tongue/throat swelling, SOB or lightheadedness with hypotension: Yes Has patient had a PCN reaction causing severe rash involving mucus membranes or skin necrosis: No Has patient had a PCN reaction that required hospitalization: No Has patient had a PCN reaction occurring within the last 10 years: Yes If all of the above answers are "NO", then may proceed with Cephalosporin use.   Ace Inhibitors Cough   Vancomycin Swelling   Facial swelling      Medication List    STOP taking these medications   furosemide 20 MG tablet Commonly known as: LASIX   glipiZIDE 10 MG 24 hr tablet Commonly known as: GLUCOTROL XL   Lantus SoloStar 100 UNIT/ML Solostar Pen Generic drug: Insulin Glargine Replaced by: insulin glargine 100 UNIT/ML injection     TAKE these medications   acetaminophen 500 MG tablet Commonly known as: TYLENOL Take 1,000 mg by mouth every 6 (six) hours as needed for mild pain, moderate pain or headache.   atenolol 25 MG tablet Commonly known as: TENORMIN Take 25 mg by mouth daily.   clobetasol cream 0.05 % Commonly known as: TEMOVATE APPLY 1 APPLICATION TOPICALLY 2 (TWO) TIMES DAILY.   donepezil 10 MG tablet Commonly known as: ARICEPT TAKE 1 TABLET AT BEDTIME   insulin glargine 100 UNIT/ML injection Commonly known as:  LANTUS Inject 0.05 mLs (5 Units total) into the skin every morning. Start taking on: July 14, 2019 Replaces: Lantus SoloStar 100 UNIT/ML Solostar Pen   Iron 325 (65 Fe) MG Tabs Take 325 mg by mouth daily.   linagliptin 5 MG Tabs tablet Commonly known as: Tradjenta Take 1 tablet (5 mg total) by mouth daily.   loratadine 10 MG tablet Commonly known as: CLARITIN Take 10 mg by mouth every morning.   lovastatin 40 MG tablet Commonly known as: MEVACOR TAKE 1 TABLET AT BEDTIME   omeprazole 40 MG capsule Commonly known as: PRILOSEC TAKE 1 CAPSULE TWICE DAILY   sertraline 50 MG tablet Commonly known as: ZOLOFT Take 1 tablet (50 mg total) by mouth daily.   tamsulosin 0.4 MG Caps capsule Commonly known as: FLOMAX TAKE 1 CAPSULE EVERY DAY   traMADol 50 MG tablet Commonly known as: ULTRAM Take one tablet by mouth two times daily for chronic joint pain   Trospium Chloride 60 MG Cp24 Take 60 mg by mouth daily.   Vitamin D (Ergocalciferol) 1.25 MG (50000 UT) Caps capsule Commonly known as: DRISDOL Take 1 capsule (50,000 Units total) by mouth every 7 (seven) days.       Discharge Assessment: Vitals:   07/08/2019 0440 07/14/2019 1400  BP: (!) 142/85 (!) 105/58  Pulse: 91 (!) 57  Resp: 20 18  Temp: 97.8 F (36.6 C) (!) 97.4 F (36.3 C)  SpO2: 97% 100%   Skin clean, dry and intact without evidence of skin break down, no evidence of skin tears noted. IV catheter  discontinued intact. Site without signs and symptoms of complications - no redness or edema noted at insertion site, patient denies c/o pain - only slight tenderness at site.  Dressing with slight pressure applied.  D/c Instructions-Education: Discharge instructions given to patient/family with verbalized understanding. D/c education completed with patient/family including follow up instructions, medication list, d/c activities limitations if indicated, with other d/c instructions as indicated by MD - patient able to  verbalize understanding, all questions fully answered. Patient instructed to return to ED, call 911, or call MD for any changes in condition.  Patient escorted via [place], and D/C home via private auto.  Santa Lighter, RN 07/12/2019 6:14 PM

## 2019-07-13 NOTE — Discharge Summary (Signed)
Physician Discharge Summary  Samuel Ryan WJX:[id number] DOB: 07-Jul-1931 DOA: 07/08/2019  PCP: [name], MD  Admit date: 07/08/2019  Discharge date: 07/12/2019  Admitted From:Home  Disposition:  SNF  Recommendations for Outpatient Follow-up:  1. Follow up with PCP in 1-2 weeks 2. Discontinue glipizide and continue on Tradjenta 5 mg daily as prescribed along with Lantus at lower dose of 5 units daily and monitor blood glucose levels closely 3. Hold home Lasix for now and consider restarting once dietary/oral intake improves 4. Please do not start anticoagulation on this patient with atrial fibrillation as he has frequent falls  Home Health: None  Equipment/Devices: None  Discharge Condition: Stable  CODE STATUS: Full  Diet recommendation: Heart Healthy  Brief/Interim Summary: 85 year old gentleman with multiple medical problems including dementia, hypertension, CKD, atrial fibrillation and interstitial fibrosis presented to the ED with frequent falls and found down on the floor at home for several hours.  8/12:CT head on admission without any acute abnormalities, but patient is now experiencing some more somnolence and altered mentation today. PT recommended SNF on discharge.  8/13: Patient appears more alert this morning, but has worsening renal function noted on laboratory data.  Neurology evaluation with brain MRI and EEG currently pending.  8/14: Patient cannot have brain MRI due to the fact that he could not lay flat, but neurology does not recommend any further head imaging as he is more alert, communicative and eating well.  He does not appear to have needed deficits suggestive of stroke and does not need further imaging.  EEG with some mild slowing and no acute changes noted.  He is stable for discharge and will have his blood glucose regimen decreased to Lantus 5 units daily and discontinue glipizide and start Tradjenta instead.  He is a high risk for falls and  does not merit any anticoagulation for his paroxysmal atrial fibrillation.  He is otherwise stable for discharge to SNF/rehab after PT evaluation performed on 8/12 and repeat assessment on 8/13 confirming the same.  Discharge Diagnoses:  Active Problems:   Falls   At risk for falling  Principal discharge diagnosis: Frequent falls secondary to advanced age and dementia.  Discharge Instructions  Discharge Instructions    Diet - low sodium heart healthy   Complete by: As directed    Increase activity slowly   Complete by: As directed      Allergies as of 07/24/2019      Reactions   Zosyn [piperacillin Sod-tazobactam So] Anaphylaxis, Swelling, Other (See Comments)   Facial swelling Has patient had a PCN reaction causing immediate rash, facial/tongue/throat swelling, SOB or lightheadedness with hypotension: Yes Has patient had a PCN reaction causing severe rash involving mucus membranes or skin necrosis: No Has patient had a PCN reaction that required hospitalization: No Has patient had a PCN reaction occurring within the last 10 years: Yes If all of the above answers are "NO", then may proceed with Cephalosporin use.   Ace Inhibitors Cough   Vancomycin Swelling   Facial swelling      Medication List    STOP taking these medications   furosemide 20 MG tablet Commonly known as: LASIX   glipiZIDE 10 MG 24 hr tablet Commonly known as: GLUCOTROL XL   Lantus SoloStar 100 UNIT/ML Solostar Pen Generic drug: Insulin Glargine Replaced by: insulin glargine 100 UNIT/ML injection     TAKE these medications   acetaminophen 500 MG tablet Commonly known as: TYLENOL Take 1,000 mg by mouth every 6 (  six) hours as needed for mild pain, moderate pain or headache.   atenolol 25 MG tablet Commonly known as: TENORMIN Take 25 mg by mouth daily.   clobetasol cream 0.05 % Commonly known as: TEMOVATE APPLY 1 APPLICATION TOPICALLY 2 (TWO) TIMES DAILY.   donepezil 10 MG tablet Commonly known  as: ARICEPT TAKE 1 TABLET AT BEDTIME   insulin glargine 100 UNIT/ML injection Commonly known as: LANTUS Inject 0.05 mLs (5 Units total) into the skin every morning. Start taking on: July 14, 2019 Replaces: Lantus SoloStar 100 UNIT/ML Solostar Pen   Iron 325 (65 Fe) MG Tabs Take 325 mg by mouth daily.   linagliptin 5 MG Tabs tablet Commonly known as: Tradjenta Take 1 tablet (5 mg total) by mouth daily.   loratadine 10 MG tablet Commonly known as: CLARITIN Take 10 mg by mouth every morning.   lovastatin 40 MG tablet Commonly known as: MEVACOR TAKE 1 TABLET AT BEDTIME   omeprazole 40 MG capsule Commonly known as: PRILOSEC TAKE 1 CAPSULE TWICE DAILY   sertraline 50 MG tablet Commonly known as: ZOLOFT Take 1 tablet (50 mg total) by mouth daily.   tamsulosin 0.4 MG Caps capsule Commonly known as: FLOMAX TAKE 1 CAPSULE EVERY DAY   traMADol 50 MG tablet Commonly known as: ULTRAM Take one tablet by mouth two times daily for chronic joint pain   Trospium Chloride 60 MG Cp24 Take 60 mg by mouth daily.   Vitamin D (Ergocalciferol) 1.25 MG (50000 UT) Caps capsule Commonly known as: DRISDOL Take 1 capsule (50,000 Units total) by mouth every 7 (seven) days.      Follow-up Information    [facility] Preferred SNF. Go to.   Specialty: [place] information: [address] [place] [phone]       [name], MD Follow up in 2 week(s).   Specialty: Family Medicine Contact information: [address] [place] [address] [phone]        Satira Sark, MD .   Specialty: Cardiology Contact information: [address] [place] [address] [phone]          Allergies  Allergen Reactions  . Zosyn [Piperacillin Sod-Tazobactam So] Anaphylaxis, Swelling and Other (See Comments)    Facial swelling Has patient had a PCN reaction causing immediate rash,  facial/tongue/throat swelling, SOB or lightheadedness with hypotension: Yes Has patient had a PCN reaction causing severe rash involving mucus membranes or skin necrosis: No Has patient had a PCN reaction that required hospitalization: No Has patient had a PCN reaction occurring within the last 10 years: Yes If all of the above answers are "NO", then may proceed with Cephalosporin use.   . Ace Inhibitors Cough  . Vancomycin Swelling    Facial swelling    Consultations:  Neurology   Procedures/Studies: Dg Chest 2 View  Result Date: 07/08/2019 CLINICAL DATA:  85 year old male with history of trauma from a fall. Confusion. EXAM: CHEST - 2 VIEW COMPARISON:  Chest x-ray 05/22/2019. FINDINGS: Lung volumes are low. No pneumothorax. Diffuse peribronchial cuffing. Widespread interstitial prominence throughout the lungs bilaterally, increased compared to the prior examination, particularly throughout the mid to lower lungs. Mild scarring in the periphery of the right lung base, similar to prior examination. Cephalization of the pulmonary vasculature. No definite pleural effusions. Mild cardiomegaly. The patient is rotated to the right on today's exam, resulting in distortion of the mediastinal contours and reduced diagnostic sensitivity  and specificity for mediastinal pathology. Aortic atherosclerosis. Visualized bony thorax appears grossly intact. IMPRESSION: 1. No signs of significant acute traumatic injury to the thorax. 2. The appearance the chest is unusual. The possibility of congestive heart failure is considered, however, clinical correlation for signs and symptoms of severe acute bronchitis is recommended. 3. Aortic atherosclerosis. Electronically Signed   By: Vinnie Langton M.D.   On: 07/08/2019 13:05   Dg Lumbar Spine 2-3 Views  Result Date: 07/08/2019 CLINICAL DATA:  Fall.  Pain. EXAM: LUMBAR SPINE - 2-3 VIEW COMPARISON:  None. FINDINGS: AP and lateral views. Five lumbar type vertebral  bodies. Diminutive twelfth ribs. Sacroiliac joints are symmetric. Radiation seeds in the prostate. Straightening of expected lordosis. Maintenance of vertebral body height. Loss of intervertebral disc height at L3-4 and L4-5. Aortic atherosclerosis. Facet arthropathy at L4-5 and L5-S1. IMPRESSION: Spondylosis, without acute osseous abnormality. Aortic Atherosclerosis (ICD10-I70.0). Electronically Signed   By: Abigail Miyamoto M.D.   On: 07/08/2019 13:06   Ct Head Wo Contrast  Result Date: 07/08/2019 CLINICAL DATA:  85 year old male with history of head trauma from a fall yesterday. EXAM: CT HEAD WITHOUT CONTRAST CT CERVICAL SPINE WITHOUT CONTRAST TECHNIQUE: Multidetector CT imaging of the head and cervical spine was performed following the standard protocol without intravenous contrast. Multiplanar CT image reconstructions of the cervical spine were also generated. COMPARISON:  Head CT 05/22/2019.  Cervical spine CT 08/03/2018. FINDINGS: CT HEAD FINDINGS Brain: Cerebral atrophy. Patchy and confluent areas of decreased attenuation are noted throughout the deep and periventricular white matter of the cerebral hemispheres bilaterally, compatible with chronic microvascular ischemic disease. Physiologic calcifications in the basal ganglia bilaterally incidentally noted. No evidence of acute infarction, hemorrhage, hydrocephalus, extra-axial collection or mass lesion/mass effect. Vascular: No hyperdense vessel or unexpected calcification. Skull: Normal. Negative for fracture or focal lesion. Sinuses/Orbits: No acute finding. Other: None. CT CERVICAL SPINE FINDINGS Alignment: Straightening of normal cervical lordosis, presumably positional. Alignment is otherwise anatomic. Skull base and vertebrae: Status post ACDF from C4-C6 with interbody grafts at C4-C5 and C5-C6. No acute fracture. No primary bone lesion or focal pathologic process. Soft tissues and spinal canal: No prevertebral fluid or swelling. No visible canal  hematoma. Disc levels: Severe multilevel degenerative disc disease, most pronounced at C3-C4. Moderate multilevel facet arthropathy. Upper chest: Negative. Other: There are no aggressive appearing lytic or blastic lesions noted in the visualized portions of the skeleton. IMPRESSION: 1. No evidence of significant acute traumatic injury to the skull, brain or cervical spine. 2. Mild cerebral atrophy with extensive chronic microvascular ischemic changes in the cerebral white matter, as above. 3. Status post ACDF at C4-C6 with multilevel degenerative disc disease and cervical spondylosis, as above. Electronically Signed   By: Vinnie Langton M.D.   On: 07/08/2019 12:30   Ct Cervical Spine Wo Contrast  Result Date: 07/08/2019 CLINICAL DATA:  85 year old male with history of head trauma from a fall yesterday. EXAM: CT HEAD WITHOUT CONTRAST CT CERVICAL SPINE WITHOUT CONTRAST TECHNIQUE: Multidetector CT imaging of the head and cervical spine was performed following the standard protocol without intravenous contrast. Multiplanar CT image reconstructions of the cervical spine were also generated. COMPARISON:  Head CT 05/22/2019.  Cervical spine CT 08/03/2018. FINDINGS: CT HEAD FINDINGS Brain: Cerebral atrophy. Patchy and confluent areas of decreased attenuation are noted throughout the deep and periventricular white matter of the cerebral hemispheres bilaterally, compatible with chronic microvascular ischemic disease. Physiologic calcifications in the basal ganglia bilaterally incidentally noted. No evidence of acute infarction, hemorrhage,  hydrocephalus, extra-axial collection or mass lesion/mass effect. Vascular: No hyperdense vessel or unexpected calcification. Skull: Normal. Negative for fracture or focal lesion. Sinuses/Orbits: No acute finding. Other: None. CT CERVICAL SPINE FINDINGS Alignment: Straightening of normal cervical lordosis, presumably positional. Alignment is otherwise anatomic. Skull base and  vertebrae: Status post ACDF from C4-C6 with interbody grafts at C4-C5 and C5-C6. No acute fracture. No primary bone lesion or focal pathologic process. Soft tissues and spinal canal: No prevertebral fluid or swelling. No visible canal hematoma. Disc levels: Severe multilevel degenerative disc disease, most pronounced at C3-C4. Moderate multilevel facet arthropathy. Upper chest: Negative. Other: There are no aggressive appearing lytic or blastic lesions noted in the visualized portions of the skeleton. IMPRESSION: 1. No evidence of significant acute traumatic injury to the skull, brain or cervical spine. 2. Mild cerebral atrophy with extensive chronic microvascular ischemic changes in the cerebral white matter, as above. 3. Status post ACDF at C4-C6 with multilevel degenerative disc disease and cervical spondylosis, as above. Electronically Signed   By: Vinnie Langton M.D.   On: 07/08/2019 12:30   Dg Chest Port 1 View  Result Date: 07/11/2019 CLINICAL DATA:  85 year old male with hypoxia and cough. EXAM: PORTABLE CHEST 1 VIEW COMPARISON:  Chest radiograph dated 07/08/2019 and CT dated 09/23/2018 FINDINGS: Shallow inspiration with bibasilar atelectasis. There is mild cardiomegaly with mild vascular congestion. There is no focal consolidation, pleural effusion, or pneumothorax. Atherosclerotic calcification of the aorta. No acute osseous pathology. IMPRESSION: Cardiomegaly with mild vascular congestion. No focal consolidation. Electronically Signed   By: Anner Crete M.D.   On: 07/11/2019 22:21   Dg Hips Bilat W Or Wo Pelvis 5 Views  Result Date: 07/08/2019 CLINICAL DATA:  Fall and pain. EXAM: DG HIP (WITH OR WITHOUT PELVIS) 5+V BILAT COMPARISON:  08/03/2018 pelvis radiographs. FINDINGS: Femoral heads are located. Sacroiliac joints are symmetric. No acute fracture. Radiation seeds in the prostate. Presumed phleboliths in the pelvis. IMPRESSION: No acute osseous abnormality. Electronically Signed   By: Abigail Miyamoto M.D.   On: 07/08/2019 13:04     Discharge Exam: Vitals:   07/12/19 2047 07/08/2019 0440  BP: 134/74 (!) 142/85  Pulse: 91 91  Resp: 18 20  Temp: 97.7 F (36.5 C) 97.8 F (36.6 C)  SpO2: 100% 97%   Vitals:   07/12/19 0522 07/12/19 1727 07/12/19 2047 07/10/2019 0440  BP: 129/86 (!) 108/59 134/74 (!) 142/85  Pulse: 94 80 91 91  Resp: 20 18 18 20   Temp: 97.6 F (36.4 C) 98.3 F (36.8 C) 97.7 F (36.5 C) 97.8 F (36.6 C)  TempSrc: Oral Oral Oral Oral  SpO2: 95% 98% 100% 97%  Weight:      Height:        General: Pt is alert, awake, not in acute distress Cardiovascular: RRR, S1/S2 +, no rubs, no gallops Respiratory: CTA bilaterally, no wheezing, no rhonchi, nasal cannula oxygen Abdominal: Soft, NT, ND, bowel sounds + Extremities: no edema, no cyanosis    The results of significant diagnostics from this hospitalization (including imaging, microbiology, ancillary and laboratory) are listed below for reference.     Microbiology: Recent Results (from the past 240 hour(s))  SARS CORONAVIRUS 2 Nasal Swab Aptima Multi Swab     Status: None   Collection Time: 07/08/19  2:51 PM   Specimen: Aptima Multi Swab; Nasal Swab  Result Value Ref Range Status   SARS Coronavirus 2 NEGATIVE NEGATIVE Final    Comment: (NOTE) SARS-CoV-2 target nucleic acids are NOT DETECTED.  The SARS-CoV-2 RNA is generally detectable in upper and lower respiratory specimens during the acute phase of infection. Negative results do not preclude SARS-CoV-2 infection, do not rule out co-infections with other pathogens, and should not be used as the sole basis for treatment or other patient management decisions. Negative results must be combined with clinical observations, patient history, and epidemiological information. The expected result is Negative. Fact Sheet for Patients: SugarRoll.be Fact Sheet for Healthcare  Providers: https://www.woods-mathews.com/ This test is not yet approved or cleared by the [place] FDA and  has been authorized for detection and/or diagnosis of SARS-CoV-2 by FDA under an Emergency Use Authorization (EUA). This EUA will remain  in effect (meaning this test can be used) for the duration of the COVID-19 declaration under Section 56 4(b)(1) of the Act, 21 U.S.C. section 360bbb-3(b)(1), unless the authorization is terminated or revoked sooner. Performed at [facility], [place] [address] [place], [place] [address]   Urine culture     Status: None   Collection Time: 07/08/19  3:12 PM   Specimen: Urine, Random  Result Value Ref Range Status   Specimen Description   Final    URINE, RANDOM Performed at [facility], [address], [place], [place] [address]    Special Requests   Final    NONE Performed at [facility], [address], [place], [place] [address]    Culture   Final    NO GROWTH Performed at [facility], [place] [address], [place], [place] [address]    Report Status 07/09/2019 FINAL  Final  Novel Coronavirus, NAA (hospital order; send-out to ref lab)     Status: None   Collection Time: 07/10/19 12:19 PM   Specimen: Nasopharyngeal Swab; Respiratory  Result Value Ref Range Status   SARS-CoV-2, NAA NOT DETECTED NOT DETECTED Final    Comment: (NOTE) This test was developed and its performance characteristics determined by [employer]. This test has not been FDA cleared or approved. This test has been authorized by FDA under an Emergency Use Authorization (EUA). This test is only authorized for the duration of time the declaration that circumstances exist justifying the authorization of the emergency use of in vitro diagnostic tests for detection of SARS-CoV-2 virus and/or diagnosis of COVID-19 infection under section 564(b)(1) of the Act, 21 U.S.C. 671IWP-8(K)(9), unless the authorization is terminated or revoked sooner.  When diagnostic testing is negative, the possibility of a false negative result should be considered in the context of a patient's recent exposures and the presence of clinical signs and symptoms consistent with COVID-19. An individual without symptoms of COVID-19 and who is not shedding SARS-CoV-2 virus would expect to have a negative (not detected) result in this assay. Performed  At: [facility] [place], [place] [id number] Rush Farmer MD LZ:[id number]    [place]  Final    Comment: Performed at [facility], [address] [place], [place] [address]     Labs: BNP (last 3 results) Recent Labs    09/21/18 1914 11/21/18 0224 07/12/19 0438  BNP 120.0* 208.0* 73.5   Basic Metabolic Panel: Recent Labs  Lab 07/08/19 1039 07/09/19 0915 07/10/19 0600 07/12/19 0438 07/27/2019 0607  NA 140 139 140 139 139  K 5.1 4.9 4.3 4.1 4.1  CL 105 105 107 108 106  CO2 27 25 22 25 26   GLUCOSE 123* 166* 146* 116* 137*  BUN 15 15 16  25* 17  CREATININE 1.58* 1.55* 1.41* 1.81*  1.52*  CALCIUM 8.6* 8.5* 8.7* 8.1* 8.1*   Liver Function Tests: Recent Labs  Lab 07/08/19 1039  AST 25  ALT 14  ALKPHOS 65  BILITOT 1.0  PROT 7.8  ALBUMIN 3.2*   No results for input(s): LIPASE, AMYLASE in the last 168 hours. Recent Labs  Lab 07/11/19 1305  AMMONIA 14   CBC: Recent Labs  Lab 07/08/19 1039 07/09/19 0915 07/10/19 0600 07/12/19 0438 07/08/2019 0607  WBC 5.8 5.5 5.7 5.3 4.6  NEUTROABS 3.5  --   --   --   --   HGB 11.3* 12.0* 12.6* 10.8* 10.8*  HCT 35.8* 38.1* 40.2 33.5* 34.3*  MCV 100.0 99.7 98.8 98.8 100.6*  PLT 192 196 194 188 191   Cardiac Enzymes: Recent Labs  Lab 07/08/19 1039  CKTOTAL 59   BNP: Invalid input(s): POCBNP CBG: Recent Labs  Lab 07/12/19 0743 07/12/19 1147 07/12/19 1637 07/12/19 2044 07/14/2019 0758  GLUCAP 122* 163* 143* 197* 119*   D-Dimer No results for input(s): DDIMER in the last 72 hours. Hgb  A1c No results for input(s): HGBA1C in the last 72 hours. Lipid Profile No results for input(s): CHOL, HDL, LDLCALC, TRIG, CHOLHDL, LDLDIRECT in the last 72 hours. Thyroid function studies Recent Labs    07/11/19 1305  TSH 0.400   Anemia work up No results for input(s): VITAMINB12, FOLATE, FERRITIN, TIBC, IRON, RETICCTPCT in the last 72 hours. Urinalysis    Component Value Date/Time   COLORURINE YELLOW 07/08/2019 1512   APPEARANCEUR HAZY (A) 07/08/2019 1512   LABSPEC 1.005 07/08/2019 1512   PHURINE 6.0 07/08/2019 1512   GLUCOSEU NEGATIVE 07/08/2019 1512   HGBUR NEGATIVE 07/08/2019 1512   BILIRUBINUR NEGATIVE 07/08/2019 1512   BILIRUBINUR neg 12/12/2017 1516   KETONESUR NEGATIVE 07/08/2019 1512   PROTEINUR NEGATIVE 07/08/2019 1512   UROBILINOGEN 1.0 12/12/2017 1516   UROBILINOGEN 0.2 06/02/2015 1310   NITRITE NEGATIVE 07/08/2019 1512   LEUKOCYTESUR SMALL (A) 07/08/2019 1512   Sepsis Labs Invalid input(s): PROCALCITONIN,  WBC,  LACTICIDVEN Microbiology Recent Results (from the past 240 hour(s))  SARS CORONAVIRUS 2 Nasal Swab Aptima Multi Swab     Status: None   Collection Time: 07/08/19  2:51 PM   Specimen: Aptima Multi Swab; Nasal Swab  Result Value Ref Range Status   SARS Coronavirus 2 NEGATIVE NEGATIVE Final    Comment: (NOTE) SARS-CoV-2 target nucleic acids are NOT DETECTED. The SARS-CoV-2 RNA is generally detectable in upper and lower respiratory specimens during the acute phase of infection. Negative results do not preclude SARS-CoV-2 infection, do not rule out co-infections with other pathogens, and should not be used as the sole basis for treatment or other patient management decisions. Negative results must be combined with clinical observations, patient history, and epidemiological information. The expected result is Negative. Fact Sheet for Patients: SugarRoll.be Fact Sheet for Healthcare  Providers: https://www.woods-mathews.com/ This test is not yet approved or cleared by the [place] FDA and  has been authorized for detection and/or diagnosis of SARS-CoV-2 by FDA under an Emergency Use Authorization (EUA). This EUA will remain  in effect (meaning this test can be used) for the duration of the COVID-19 declaration under Section 56 4(b)(1) of the Act, 21 U.S.C. section 360bbb-3(b)(1), unless the authorization is terminated or revoked sooner. Performed at [facility], [place] [address] [place], [place] [address]   Urine culture     Status: None   Collection Time: 07/08/19  3:12 PM   Specimen: Urine, Random  Result  Value Ref Range Status   Specimen Description   Final    URINE, RANDOM Performed at [facility], [address], [place], [place] [address]    Special Requests   Final    NONE Performed at [facility], [address] [place], [place] [address]    Culture   Final    NO GROWTH Performed at [facility], [place] [address], [place], [place] [address]    Report Status 07/09/2019 FINAL  Final  Novel Coronavirus, NAA (hospital order; send-out to ref lab)     Status: None   Collection Time: 07/10/19 12:19 PM   Specimen: Nasopharyngeal Swab; Respiratory  Result Value Ref Range Status   SARS-CoV-2, NAA NOT DETECTED NOT DETECTED Final    Comment: (NOTE) This test was developed and its performance characteristics determined by [employer]. This test has not been FDA cleared or approved. This test has been authorized by FDA under an Emergency Use Authorization (EUA). This test is only authorized for the duration of time the declaration that circumstances exist justifying the authorization of the emergency use of in vitro diagnostic tests for detection of SARS-CoV-2 virus and/or diagnosis of COVID-19 infection under section 564(b)(1) of the Act, 21 U.S.C. 030SPQ-3(R)(0), unless the authorization is terminated or revoked sooner.  When diagnostic testing is negative, the possibility of a false negative result should be considered in the context of a patient's recent exposures and the presence of clinical signs and symptoms consistent with COVID-19. An individual without symptoms of COVID-19 and who is not shedding SARS-CoV-2 virus would expect to have a negative (not detected) result in this assay. Performed  At: [facility] [address] [place], [place] [id number] Rush Farmer MD LK:[id number]    [place]  Final    Comment: Performed at [facility], [address] [place], [place] [address]     Time coordinating discharge: 40 minutes  SIGNED:   Rodena Goldmann, DO [facility] 07/26/2019, 11:17 AM  If 7PM-7AM, please contact night-coverage [email or web address] Password [email or web address]

## 2019-07-13 NOTE — Plan of Care (Signed)
  Problem: Education: Goal: Knowledge of General Education information will improve Description: Including pain rating scale, medication(s)/side effects and non-pharmacologic comfort measures 07/11/2019 1416 by Santa Lighter, RN Outcome: Adequate for Discharge 07/12/2019 1416 by Santa Lighter, RN Outcome: Progressing   Problem: Clinical Measurements: Goal: Ability to maintain clinical measurements within normal limits will improve Outcome: Adequate for Discharge Goal: Will remain free from infection Outcome: Adequate for Discharge Goal: Diagnostic test results will improve Outcome: Adequate for Discharge Goal: Respiratory complications will improve Outcome: Adequate for Discharge Goal: Cardiovascular complication will be avoided Outcome: Adequate for Discharge   Problem: Health Behavior/Discharge Planning: Goal: Ability to manage health-related needs will improve 07/01/2019 1416 by Santa Lighter, RN Outcome: Adequate for Discharge 07/21/2019 1416 by Santa Lighter, RN Outcome: Progressing   Problem: Activity: Goal: Risk for activity intolerance will decrease Outcome: Adequate for Discharge   Problem: Nutrition: Goal: Adequate nutrition will be maintained Outcome: Adequate for Discharge   Problem: Coping: Goal: Level of anxiety will decrease Outcome: Adequate for Discharge   Problem: Elimination: Goal: Will not experience complications related to bowel motility Outcome: Adequate for Discharge Goal: Will not experience complications related to urinary retention Outcome: Adequate for Discharge   Problem: Skin Integrity: Goal: Risk for impaired skin integrity will decrease Outcome: Adequate for Discharge   Problem: Safety: Goal: Ability to remain free from injury will improve Outcome: Adequate for Discharge   Problem: Pain Managment: Goal: General experience of comfort will improve Outcome: Adequate for Discharge

## 2019-07-13 NOTE — Care Management Important Message (Signed)
Important Message  Patient Details  Name: Samuel Ryan MRN: [id number] Date of Birth: [birth year]   Medicare Important Message Given:  Yes     Boneta Lucks, RN 06/30/2019, 1:40 PM

## 2019-07-13 NOTE — Progress Notes (Signed)
Physical Therapy Treatment Patient Details Name: Samuel Ryan MRN: [id number] DOB: 11-29-1931 Today's Date: 07/22/2019    History of Present Illness Samuel Ryan is a 85 y.o. male with medical history significant for diabetes mellitus, dementia, hypertension, BPH, CKD 3, atrial fibrillation, interstitial fibrosis and DVT, patient was brought in by EMS with reports of a fall earlier today.  Patient is awake and alert able to give me a little bit of history and answer simple questions.  Patient lives with his son who works and they have a Actuary who comes into the house for about 4 hours every day.  Patient was unable to get up onto his sitter got there about 4 hours later.Patient has an abrasion to the right upper side of his head, he tells me he fell 2 days ago also, sustaining that bruise.  He denies chest pain or difficulty breathing, he denies cough.  He is unsure why he fell.  He denies urinary symptoms.    PT Comments    Patient demonstrates increased tolerance/endurance for taking steps at bedside, able to transfer to [facility], back to bedside and later to chair.  Patient limited mostly due to fatigue, poor standing balance and had one near loss of balance during transfer to [facility], stood for up to 5-6 minutes while being cleaned after sitting on BSC and tolerated sitting up in chair after therapy.  Patient will benefit from continued physical therapy in hospital and recommended venue below to increase strength, balance, endurance for safe ADLs and gait.    Follow Up Recommendations  SNF;Supervision for mobility/OOB;Supervision/Assistance - 24 hour     Equipment Recommendations  None recommended by PT    Recommendations for Other Services       Precautions / Restrictions Precautions Precautions: Fall Restrictions Weight Bearing Restrictions: No    Mobility  Bed Mobility Overal bed mobility: Needs Assistance Bed Mobility: Supine to Sit     Supine to sit: Min assist     General bed  mobility comments: labored movement, increased time  Transfers Overall transfer level: Needs assistance Equipment used: Rolling walker (2 wheeled) Transfers: Sit to/from [facility] Sit to Stand: Mod assist Stand pivot transfers: Mod assist       General transfer comment: slow labored movement, had to raise bed slightly for sit to stands, transferred to [facility] and chair  Ambulation/Gait Ambulation/Gait assistance: Mod assist;Max assist Gait Distance (Feet): 5 Feet Assistive device: Rolling walker (2 wheeled) Gait Pattern/deviations: Decreased step length - right;Decreased step length - left;Decreased stride length Gait velocity: slow   General Gait Details: limited to 5-6 slow labored unsteady labored steps with near loss of balance during transfer to [place]             Wheelchair Mobility    Modified Rankin (Stroke Patients Only)       Balance Overall balance assessment: Needs assistance Sitting-balance support: Feet supported;No upper extremity supported Sitting balance-Leahy Scale: Fair Sitting balance - Comments: fair/good seated at bedside   Standing balance support: Bilateral upper extremity supported;During functional activity Standing balance-Leahy Scale: Poor Standing balance comment: fair/poor using RW                            Cognition Arousal/Alertness: Awake/alert Behavior During Therapy: WFL for tasks assessed/performed Overall Cognitive Status: History of cognitive impairments - at baseline  Exercises General Exercises - Lower Extremity Long Arc Quad: AROM;Both;Strengthening;10 reps;Seated Hip Flexion/Marching: Seated;AROM;Strengthening;Both;10 reps Toe Raises: Seated;AROM;Strengthening;Both;10 reps Heel Raises: 10 reps;Both;Strengthening;AROM;Seated    General Comments        Pertinent Vitals/Pain Pain Assessment: No/denies pain    Home Living                       Prior Function            PT Goals (current goals can now be found in the care plan section) Acute Rehab PT Goals Patient Stated Goal: return home with family to assist PT Goal Formulation: With patient Time For Goal Achievement: 07/23/19 Potential to Achieve Goals: Good Progress towards PT goals: Progressing toward goals    Frequency    Min 3X/week      PT Plan Current plan remains appropriate    Co-evaluation              AM-PAC PT "6 Clicks" Mobility   Outcome Measure  Help needed turning from your back to your side while in a flat bed without using bedrails?: A Little Help needed moving from lying on your back to sitting on the side of a flat bed without using bedrails?: A Little Help needed moving to and from a bed to a chair (including a wheelchair)?: A Lot Help needed standing up from a chair using your arms (e.g., wheelchair or bedside chair)?: A Lot Help needed to walk in hospital room?: A Lot Help needed climbing 3-5 steps with a railing? : Total 6 Click Score: 13    End of Session Equipment Utilized During Treatment: Oxygen Activity Tolerance: Patient tolerated treatment well;Patient limited by fatigue Patient left: in chair;with call bell/phone within reach Nurse Communication: Mobility status PT Visit Diagnosis: Unsteadiness on feet (R26.81);Other abnormalities of gait and mobility (R26.89);Muscle weakness (generalized) (M62.81)     Time: 8250-0370 PT Time Calculation (min) (ACUTE ONLY): 34 min  Charges:  $Therapeutic Exercise: 8-22 mins $Therapeutic Activity: 8-22 mins            12:42 PM, 07/29/2019 [name], MPT Physical Therapist with [facility] [phone] office [phone] mobile phone

## 2019-07-15 ENCOUNTER — Other Ambulatory Visit: Payer: Self-pay | Admitting: Adult Health

## 2019-07-15 MED ORDER — TRAMADOL HCL 50 MG PO TABS: ORAL_TABLET | ORAL | 0 refills | Status: DC

## 2019-07-16 ENCOUNTER — Non-Acute Institutional Stay (SKILLED_NURSING_FACILITY): Payer: Medicare HMO | Admitting: Adult Health

## 2019-07-16 ENCOUNTER — Encounter: Payer: Self-pay | Admitting: Adult Health

## 2019-07-16 DIAGNOSIS — J8410 Pulmonary fibrosis, unspecified: Secondary | ICD-10-CM

## 2019-07-16 DIAGNOSIS — D649 Anemia, unspecified: Secondary | ICD-10-CM

## 2019-07-16 DIAGNOSIS — E1122 Type 2 diabetes mellitus with diabetic chronic kidney disease: Secondary | ICD-10-CM | POA: Diagnosis not present

## 2019-07-16 DIAGNOSIS — I129 Hypertensive chronic kidney disease with stage 1 through stage 4 chronic kidney disease, or unspecified chronic kidney disease: Secondary | ICD-10-CM

## 2019-07-16 DIAGNOSIS — K219 Gastro-esophageal reflux disease without esophagitis: Secondary | ICD-10-CM

## 2019-07-16 DIAGNOSIS — N401 Enlarged prostate with lower urinary tract symptoms: Secondary | ICD-10-CM

## 2019-07-16 DIAGNOSIS — I4811 Longstanding persistent atrial fibrillation: Secondary | ICD-10-CM | POA: Diagnosis not present

## 2019-07-16 DIAGNOSIS — F339 Major depressive disorder, recurrent, unspecified: Secondary | ICD-10-CM

## 2019-07-16 DIAGNOSIS — N183 Chronic kidney disease, stage 3 unspecified: Secondary | ICD-10-CM

## 2019-07-16 DIAGNOSIS — E559 Vitamin D deficiency, unspecified: Secondary | ICD-10-CM

## 2019-07-16 DIAGNOSIS — M159 Polyosteoarthritis, unspecified: Secondary | ICD-10-CM | POA: Diagnosis not present

## 2019-07-16 DIAGNOSIS — J3089 Other allergic rhinitis: Secondary | ICD-10-CM | POA: Diagnosis not present

## 2019-07-16 DIAGNOSIS — G301 Alzheimer's disease with late onset: Secondary | ICD-10-CM

## 2019-07-16 DIAGNOSIS — E1165 Type 2 diabetes mellitus with hyperglycemia: Secondary | ICD-10-CM

## 2019-07-16 DIAGNOSIS — E1169 Type 2 diabetes mellitus with other specified complication: Secondary | ICD-10-CM | POA: Diagnosis not present

## 2019-07-16 DIAGNOSIS — Z794 Long term (current) use of insulin: Secondary | ICD-10-CM

## 2019-07-16 DIAGNOSIS — IMO0002 Reserved for concepts with insufficient information to code with codable children: Secondary | ICD-10-CM

## 2019-07-16 DIAGNOSIS — R350 Frequency of micturition: Secondary | ICD-10-CM

## 2019-07-16 DIAGNOSIS — E1121 Type 2 diabetes mellitus with diabetic nephropathy: Secondary | ICD-10-CM | POA: Diagnosis not present

## 2019-07-16 DIAGNOSIS — E785 Hyperlipidemia, unspecified: Secondary | ICD-10-CM

## 2019-07-16 DIAGNOSIS — R338 Other retention of urine: Secondary | ICD-10-CM

## 2019-07-16 DIAGNOSIS — F0280 Dementia in other diseases classified elsewhere without behavioral disturbance: Secondary | ICD-10-CM

## 2019-07-16 NOTE — Progress Notes (Signed)
Location:   [facility] Room Number: 147 D Place of Service:  SNF (31)   CODE STATUS: Full Code  Allergies  Allergen Reactions  . Zosyn [Piperacillin Sod-Tazobactam So] Anaphylaxis, Swelling and Other (See Comments)    Facial swelling Has patient had a PCN reaction causing immediate rash, facial/tongue/throat swelling, SOB or lightheadedness with hypotension: Yes Has patient had a PCN reaction causing severe rash involving mucus membranes or skin necrosis: No Has patient had a PCN reaction that required hospitalization: No Has patient had a PCN reaction occurring within the last 10 years: Yes If all of the above answers are "NO", then may proceed with Cephalosporin use.   . Ace Inhibitors Cough  . Vancomycin Swelling    Facial swelling    Chief Complaint  Patient presents with  . Hospitalization Follow-up    Hospital Follow up    HPI:  He is a 85 year old man who has been hospitalized from 07-08-19 through 07/06/2019. He was hospitalized after an apparent  fall at home where he laid on the floor for four hours. He had had several falls at home. He was admitted for further up for his falls.  He did not have an uti. It was felt as though he would benefit from SNF therapy. He lives at home with his son; he has a Actuary 4 hours daily. He has a walker at home. He denies any uncontrolled pain; he has chronic bilateral lower extremity edema. He states he is sleeping well at night and denies any anxiety. He will continue to be followed for his chronic illnesses including: diabetes; hypertension; afib.   Past Medical History:  Diagnosis Date  . Acute cholecystitis 11/2012   Drained percutaneously  . Anxiety   . BPH (benign prostatic hypertrophy)   . Deep venous thrombosis (HCC)    Left leg, diagnosed 7/12  . Dementia [facility])    Needs supervision for safety per son - POA  . Diverticulitis   . Essential hypertension   . GERD (gastroesophageal reflux disease)    . History of pneumonia    Prior to 2012  . Incontinence of urine   . Mixed hyperlipidemia   . Osteoarthritis   . Pulmonary embolism ([place])    Diagnosed 7/12  . Type 2 diabetes mellitus ([place])     Past Surgical History:  Procedure Laterality Date  . CARPAL TUNNEL RELEASE    . COLONOSCOPY  08/31/2012   Procedure: COLONOSCOPY;  Surgeon: Rogene Houston, MD;  Location: [facility];  Service: Endoscopy;  Laterality: N/A;  830  . CYSTOSCOPY WITH INJECTION N/A 05/15/2018   Procedure: CYSTOSCOPY WITH BOTOX INJECTION;  Surgeon: Cleon Gustin, MD;  Location: [facility] ORS;  Service: Urology;  Laterality: N/A;  . CYSTOSCOPY WITH INJECTION N/A 11/10/2018   Procedure: CYSTOSCOPY WITH BOTOX  INJECTION;  Surgeon: Cleon Gustin, MD;  Location: [facility] ORS;  Service: Urology;  Laterality: N/A;  30 MINS  . CYSTOSCOPY WITH INSERTION OF UROLIFT N/A 09/02/2016   Procedure: CYSTOSCOPY WITH INSERTION OF UROLIFT;  Surgeon: Cleon Gustin, MD;  Location: [facility];  Service: Urology;  Laterality: N/A;  . EYE SURGERY Bilateral    bilateral cataract  . Right total knee replacement  04/23/2011  . SPINE SURGERY  prior to 2012   Neck fusion  . UMBILICAL HERNIA REPAIR     umbilical    Social History   Socioeconomic History  . Marital status: Widowed  Spouse name: Not on file  . Number of children: Not on file  . Years of education: Not on file  . Highest education level: Not on file  Occupational History  . Not on file  Social Needs  . Financial resource strain: Not on file  . Food insecurity    Worry: Not on file    Inability: Not on file  . Transportation needs    Medical: Not on file    Non-medical: Not on file  Tobacco Use  . Smoking status: Never Smoker  . Smokeless tobacco: Never Used  Substance and Sexual Activity  . Alcohol use: No  . Drug use: No  . Sexual activity: Never  Lifestyle  . Physical activity    Days per week: Not on file    Minutes per session: Not  on file  . Stress: Not on file  Relationships  . Social Herbalist on phone: Not on file    Gets together: Not on file    Attends religious service: Not on file    Active member of club or organization: Not on file    Attends meetings of clubs or organizations: Not on file    Relationship status: Not on file  . Intimate partner violence    Fear of current or ex partner: Not on file    Emotionally abused: Not on file    Physically abused: Not on file    Forced sexual activity: Not on file  Other Topics Concern  . Not on file  Social History Narrative  . Not on file   Family History  Problem Relation Age of Onset  . Cancer Mother        Stomach  . Diabetes Sister   . Hypertension Sister   . Diabetes Brother   . Cancer Brother        Gallbladder  . Seizures Son        due to meningitis as a child      VITAL SIGNS BP 105/72   Pulse 97   Temp 97.7 F (36.5 C)   Resp 20   Ht 5\' 10"  (1.778 m)   Wt 240 lb 12.8 oz (109.2 kg)   SpO2 95%   BMI 34.55 kg/m   Facility-Administered Encounter Medications as of 07/16/2019  Medication  . botulinum toxin Type A (BOTOX) injection 100 Units   Outpatient Encounter Medications as of 07/16/2019  Medication Sig  . acetaminophen (TYLENOL) 500 MG tablet Take 1,000 mg by mouth every 6 (six) hours as needed for mild pain, moderate pain or headache.   [name] atenolol (TENORMIN) 25 MG tablet Take 25 mg by mouth daily.  [name] donepezil (ARICEPT) 10 MG tablet TAKE 1 TABLET AT BEDTIME  . Ferrous Sulfate (IRON) 325 (65 Fe) MG TABS Take 325 mg by mouth daily.   . insulin glargine (LANTUS) 100 UNIT/ML injection Inject 0.05 mLs (5 Units total) into the skin every morning.  . linagliptin (TRADJENTA) 5 MG TABS tablet Take 1 tablet (5 mg total) by mouth daily.  [name] loratadine (CLARITIN) 10 MG tablet Take 10 mg by mouth every morning.   . lovastatin (MEVACOR) 40 MG tablet TAKE 1 TABLET AT BEDTIME  . NON FORMULARY Diet Type: NAS, Consistent  Carbohydrate  . omeprazole (PRILOSEC) 40 MG capsule TAKE 1 CAPSULE TWICE DAILY  . OXYGEN Inhale 2 L/min into the lungs as needed. May give oxygen at 2 L/min via nasal cannula, titrating to keep O2 saturation over 90%.  Inform physician ASAP for acute onset dyspnea or O2 saturation < 88%  . sertraline (ZOLOFT) 50 MG tablet Take 1 tablet (50 mg total) by mouth daily.  . tamsulosin (FLOMAX) 0.4 MG CAPS capsule TAKE 1 CAPSULE EVERY DAY  . traMADol (ULTRAM) 50 MG tablet Take one tablet by mouth two times daily for chronic joint pain  . Trospium Chloride 60 MG CP24 Take 60 mg by mouth daily.   . Vitamin D, Ergocalciferol, (DRISDOL) 1.25 MG (50000 UT) CAPS capsule Take 1 capsule (50,000 Units total) by mouth every 7 (seven) days.  . [DISCONTINUED] clobetasol cream (TEMOVATE) 9.70 % APPLY 1 APPLICATION TOPICALLY 2 (TWO) TIMES DAILY. (Patient not taking: Reported on 07/16/2019)     SIGNIFICANT DIAGNOSTIC EXAMS  TODAY;   07-08-19: ct of head an cervical spine:  1. No evidence of significant acute traumatic injury to the skull, brain or cervical spine. 2. Mild cerebral atrophy with extensive chronic microvascular ischemic changes in the cerebral white matter, as above. 3. Status post ACDF at C4-C6 with multilevel degenerative disc disease and cervical spondylosis, as above  07-08-19: chest x-ray:  1. No signs of significant acute traumatic injury to the thorax. 2. The appearance the chest is unusual. The possibility of congestive heart failure is considered, however, clinical correlation for signs and symptoms of severe acute bronchitis is recommended. 3. Aortic atherosclerosis.   07-08-19: bilateral hip x-ray: No acute osseous abnormality.   07-08-19; lumbar spine x-ray: Spondylosis, without acute osseous abnormality. Aortic Atherosclerosis   07-11-19: chest x-ray: Cardiomegaly with mild vascular congestion. No focal consolidation.   LABS REVIEWED TODAY;   04-24-19: hgb a1c 10.0 chol 130; ldl 76; tirg  115; hdl 34; vit D 51 07-08-19: wbc 5.8; hgb 11.3; hct 35.8; mcv 100.0; plt 192; glucose 123; bun 15; creat 1.58; k+ 5.1; na++ 140; ca 8.6 liver normal albumin 3.2; urine culture: no growth 07-10-19: wbc 5.7; hgb 12.6; hct 40.2; mcv 98.8 plt 194; glucose 146; bun 16; creat 1.41; k+ 4.3; na++ 140 ca 8.7 07-11-19: tsh 0.400 ammonia 14 07/26/2019: wbc 4.6; hgb 10.8; hct 34.3; mvc 100.6 plt 191; glucose 137; bun 17; creat 1.52; k+ 4.1; na+ 139; ca 8.1    Review of Systems  Constitutional: Negative for malaise/fatigue.  Respiratory: Negative for cough and shortness of breath.   Cardiovascular: Negative for chest pain, palpitations and leg swelling.  Gastrointestinal: Negative for abdominal pain, constipation and heartburn.  Musculoskeletal: Negative for back pain, joint pain and myalgias.  Skin: Negative.   Neurological: Negative for dizziness.  Psychiatric/Behavioral: The patient is not nervous/anxious.     Physical Exam Constitutional:      General: He is not in acute distress.    Appearance: He is well-developed. He is obese. He is not diaphoretic.  Eyes:     Comments: History of bilateral cataract removal   Neck:     Musculoskeletal: Neck supple.     Thyroid: No thyromegaly.  Cardiovascular:     Rate and Rhythm: Normal rate and regular rhythm.     Pulses: Normal pulses.     Heart sounds: Normal heart sounds.  Pulmonary:     Effort: Pulmonary effort is normal. No respiratory distress.     Breath sounds: Normal breath sounds.  Abdominal:     General: Bowel sounds are normal. There is no distension.     Palpations: Abdomen is soft.     Tenderness: There is no abdominal tenderness.  Musculoskeletal:     Right lower leg: Edema present.  Left lower leg: Edema present.     Comments: Chronic 2+ bilateral lower extremity edema Is able to move all extremities  History of right total knee replacement and neck fusion   Lymphadenopathy:     Cervical: No cervical adenopathy.  Skin:     General: Skin is warm and dry.     Comments: Bilateral lower extremities discolored   Neurological:     Mental Status: He is alert. Mental status is at baseline.  Psychiatric:        Mood and Affect: Mood normal.     ASSESSMENT/ PLAN:  TODAY:   1. Long standing persistent atrial fibrillation: heart rate is stable. Is on tenormin 25 mg nightly for rate control. He is not appropriate for anticoagulation therapy due to his falls.   2. Hypertension associated with stage 3 chronic kidney disease due to  type 2 diabetes mellitus: is stable b/p 105/72 will continue tenormin 25 mg nightly   3. Pulmonary interstitial fibrosis: is without change has required use of 02 over this past weekend.   4. Chronic non-seasonal allergic rhinitis: is stable will continue claritin 10 mg daily   5. Uncontrolled diabetes mellitus with diabetic nephropathy with long term current use of insulin: is without change hgb a1c 10.0; will continue lantus 5 units nightly and tradjenta 5 mg daily and will continue to monitor his status.   6. Dyslipidemia associated with type 2 diabetes mellitus: is stable LDL 76 will continue mevacor 40 mg daily   7. GERD without esophagitis: is stable will continue prilosec 40 mg daily   8. Late onset alzheimer's disease without behavioral disturbance; is without change weight is 240 pounds; will continue aricept 10 mg daily   9.  Generalized osteoarthritis: is stable   Will continue ultram 50 mg twice daily for pain management  10. CKD stage 3 due to diabetes mellitus type 2: is stable bun 17 creat 1.52 will monitor  11.  Chronic anemia: is stable hgb 10.8 will continue iron daily   12. Urinary frequency/benign prostatic hyperplasia with urinary retention: is stable will continue trospium 60 mgm daily and flomax 0.4 mg daily   13.  Major depression recurrent chronic: is stable will continue zoloft 50 mg daily  14. Vit D deficiency: is stable vit D 44 will continue vit D 50,000  units weekly        MD is aware of resident's narcotic use and is in agreement with current plan of care. We will attempt to wean resident as apropriate   Ok Edwards NP [facility] Adult Medicine  Contact [phone] Monday through Friday 8am- 5pm  After hours call [phone]

## 2019-07-18 ENCOUNTER — Non-Acute Institutional Stay (SKILLED_NURSING_FACILITY): Payer: Medicare HMO | Admitting: Internal Medicine

## 2019-07-18 ENCOUNTER — Encounter: Payer: Self-pay | Admitting: Internal Medicine

## 2019-07-18 DIAGNOSIS — R338 Other retention of urine: Secondary | ICD-10-CM

## 2019-07-18 DIAGNOSIS — E1165 Type 2 diabetes mellitus with hyperglycemia: Secondary | ICD-10-CM | POA: Diagnosis not present

## 2019-07-18 DIAGNOSIS — E785 Hyperlipidemia, unspecified: Secondary | ICD-10-CM | POA: Diagnosis not present

## 2019-07-18 DIAGNOSIS — F0390 Unspecified dementia without behavioral disturbance: Secondary | ICD-10-CM

## 2019-07-18 DIAGNOSIS — N401 Enlarged prostate with lower urinary tract symptoms: Secondary | ICD-10-CM | POA: Diagnosis not present

## 2019-07-18 DIAGNOSIS — I4811 Longstanding persistent atrial fibrillation: Secondary | ICD-10-CM | POA: Diagnosis not present

## 2019-07-18 DIAGNOSIS — E1169 Type 2 diabetes mellitus with other specified complication: Secondary | ICD-10-CM

## 2019-07-18 NOTE — Progress Notes (Signed)
: Provider: Noah Delaine. Sheppard Coil MD  Location:  [place] Room Number: 157/D Place of Service:  SNF ([phone])  PCP: [name], MD Patient Care Team: [name], MD as PCP - General (Family Medicine) Satira Sark, MD as PCP - Cardiology (Cardiology) Alyson Ingles [name], MD as Consulting Physician (Urology)  Extended Emergency Contact Information Primary Emergency Contact: [facility] Address: [place], [place] [address] [place] Phone: [phone] Relation: Son     Allergies: Zosyn [piperacillin sod-tazobactam so], Ace inhibitors, and Vancomycin  Chief Complaint  Patient presents with  . New Admit To SNF    History and physical    HPI: Patient is 85 y.o. male with dementia, hypertension, CKD, atrial fib, and interstitial fibrosis, who presented to [facility], ED with frequent falls and being found down on the floor at home having been there for several hours.  On admission CT head showed no acute abnormalities the patient appeared somnolent and with altered mental status.  Patient is admitted to any [facility] from 8/9-14 where he was treated for altered mental status.  Patient could not have MRI of brain due to the fact that he could not lie flat.  EEG showed some mild slowing but no other acute changes.  Patient is considered a high risk for falls but therefore will have no anticoagulation for his paroxysmal atrial fibrillation.  Patient's diabetic regimen was changed due to high blood sugars and patient's glipizide was DC'd and Tradjenta was started instead.  All other problems being stable patient is admitted to skilled nursing facility for OT/PT.  While at skilled nursing facility patient will be followed for dementia treated with Aricept hyperlipidemia treated with Mevacor and GERD treated with Prilosec.  Past Medical History:  Diagnosis Date  . Acute cholecystitis 11/2012   Drained  percutaneously  . Anxiety   . BPH (benign prostatic hypertrophy)   . Deep venous thrombosis (HCC)    Left leg, diagnosed 7/12  . Dementia [facility])    Needs supervision for safety per son - POA  . Diverticulitis   . Essential hypertension   . GERD (gastroesophageal reflux disease)   . History of pneumonia    Prior to 2012  . Incontinence of urine   . Mixed hyperlipidemia   . Osteoarthritis   . Pulmonary embolism ([place])    Diagnosed 7/12  . Type 2 diabetes mellitus ([place])     Past Surgical History:  Procedure Laterality Date  . CARPAL TUNNEL RELEASE    . COLONOSCOPY  08/31/2012   Procedure: COLONOSCOPY;  Surgeon: Rogene Houston, MD;  [facility] ENDO SU[facility];  Service: Endoscopy;  Laterality: N/A;  830  . CYSTOSCOPY WITH INJECTION N/A 05/15/2018   Procedure: CYSTOSCOPY WITH BOTOX INJECTION;  Surgeon: Cleon Gustin, MD;  Location: [facility] ORS;  Service: Urology;  Laterality: N/A;  . CYSTOSCOPY WITH INJECTION N/A 11/10/2018   Procedure: CYSTOSCOPY WITH BOTOX  INJECTION;  Surgeon: Cleon Gustin, MD;  Location: [facility] ORS;  Service: Urology;  Laterality: N/A;  30 MINS  . CYSTOSCOPY WITH INSERTION OF UROLIFT N/A 09/02/2016   Procedure: CYSTOSCOPY WITH INSERTION OF UROLIFT;  Surgeon: Cleon Gustin, MD;  Location: [facility];  Service: Urology;  Laterality: N/A;  . EYE SURGERY Bilateral    bilateral cataract  . Right total knee replacement  04/23/2011  . SPINE SURGERY  prior to 2012  Neck fusion  . UMBILICAL HERNIA REPAIR     umbilical    Facility-Administered Encounter Medications as of 07/18/2019  Medication  . botulinum toxin Type A (BOTOX) injection 100 Units   Outpatient Encounter Medications as of 07/18/2019  Medication Sig  . acetaminophen (TYLENOL) 500 MG tablet Take 1,000 mg by mouth every 6 (six) hours as needed for mild pain, moderate pain or headache.   [name] atenolol (TENORMIN) 25 MG tablet Take 25 mg by mouth daily.  [name] donepezil (ARICEPT) 10 MG tablet  TAKE 1 TABLET AT BEDTIME  . Ferrous Sulfate (IRON) 325 (65 Fe) MG TABS Take 325 mg by mouth daily.   . insulin glargine (LANTUS) 100 UNIT/ML injection Inject 0.05 mLs (5 Units total) into the skin every morning.  . linagliptin (TRADJENTA) 5 MG TABS tablet Take 1 tablet (5 mg total) by mouth daily.  [name] loratadine (CLARITIN) 10 MG tablet Take 10 mg by mouth every morning.   . lovastatin (MEVACOR) 40 MG tablet TAKE 1 TABLET AT BEDTIME  . NON FORMULARY Diet Type: Regular, NAS, Consistent Carbohydrate  . omeprazole (PRILOSEC) 40 MG capsule TAKE 1 CAPSULE TWICE DAILY  . OXYGEN Inhale 2 L/min into the lungs as needed. May give oxygen at 2 L/min via nasal cannula, titrating to keep O2 saturation over 90%.  Inform physician ASAP for acute onset dyspnea or O2 saturation < 88%  . sertraline (ZOLOFT) 50 MG tablet Take 1 tablet (50 mg total) by mouth daily.  . tamsulosin (FLOMAX) 0.4 MG CAPS capsule TAKE 1 CAPSULE EVERY DAY  . traMADol (ULTRAM) 50 MG tablet Take one tablet by mouth two times daily for chronic joint pain  . Trospium Chloride 60 MG CP24 Take 60 mg by mouth daily.   . Vitamin D, Ergocalciferol, (DRISDOL) 1.25 MG (50000 UT) CAPS capsule Take 1 capsule (50,000 Units total) by mouth every 7 (seven) days.    No orders of the defined types were placed in this encounter.   Immunization History  Administered Date(s) Administered  . Influenza Split 10/14/2011, 09/26/2012, 09/29/2013  . Influenza Whole 09/07/2007, 09/04/2009, 08/04/2010  . Influenza,inj,Quad PF,6+ Mos 09/09/2014, 08/13/2016, 10/05/2017, 08/28/2018  . Pneumococcal Conjugate-13 07/16/2014  . Pneumococcal Polysaccharide-23 08/04/2010  . Td 07/28/2004  . Zoster 03/08/2007    Social History   Tobacco Use  . Smoking status: Never Smoker  . Smokeless tobacco: Never Used  Substance Use Topics  . Alcohol use: No    Family history is   Family History  Problem Relation Age of Onset  . Cancer Mother        Stomach  . Diabetes  Sister   . Hypertension Sister   . Diabetes Brother   . Cancer Brother        Gallbladder  . Seizures Son        due to meningitis as a child      Review of Systems  DATA OBTAINED: from patient-limited, nurse GENERAL:  no fevers, fatigue, appetite changes SKIN: No itching, or rash EYES: No eye pain, redness, discharge EARS: No earache, tinnitus, change in hearing NOSE: No congestion, drainage or bleeding  MOUTH/THROAT: No mouth or tooth pain, No sore throat RESPIRATORY: No cough, wheezing, SOB CARDIAC: No chest pain, palpitations, lower extremity edema  GI: No abdominal pain, No N/V/D or constipation, No heartburn or reflux  GU: No dysuria, frequency or urgency, or incontinence  MUSCULOSKELETAL: No unrelieved bone/joint pain NEUROLOGIC: No headache, dizziness or focal weakness PSYCHIATRIC: No c/o anxiety or sadness  Vitals:   07/18/19 1212  BP: 125/83  Pulse: 73  Resp: 16  Temp: 98 F (36.7 C)  SpO2: 95%    SpO2 Readings from Last 1 Encounters:  07/18/19 95%   Body mass index is 35.43 kg/m.     Physical Exam  GENERAL APPEARANCE: Alert, conversant,  No acute distress.  SKIN: No diaphoresis rash HEAD: Normocephalic, atraumatic  EYES: Conjunctiva/lids clear. Pupils round, reactive. EOMs intact.  EARS: External exam WNL, canals clear. Hearing grossly normal.  NOSE: No deformity or discharge.  MOUTH/THROAT: Lips w/o lesions  RESPIRATORY: Breathing is even, unlabored. Lung sounds are clear   CARDIOVASCULAR: Heart RRR no murmurs, rubs or gallops. No peripheral edema.   GASTROINTESTINAL: Abdomen is soft, non-tender, not distended w/ normal bowel sounds. GENITOURINARY: Bladder non tender, not distended  MUSCULOSKELETAL: No abnormal joints or musculature NEUROLOGIC:  Cranial nerves 2-12 grossly intact. Moves all extremities  PSYCHIATRIC: Mood and affect with dementia, no behavioral issues  Patient Active Problem List   Diagnosis Date Noted  . Hypertension  associated with stage 3 chronic kidney disease due to type 2 diabetes mellitus ([place]) 07/16/2019  . Dyslipidemia associated with type 2 diabetes mellitus ([place]) 07/16/2019  . GERD without esophagitis 07/16/2019  . CKD stage 3 due to type 2 diabetes mellitus ([place]) 07/16/2019  . Major depression, recurrent, chronic ([place]) 07/16/2019  . At risk for falling 07/09/2019  . Falls 07/08/2019  . Bilateral hand pain 11/28/2018  . Knee pain, left 10/10/2018  . History of total knee arthroplasty, right 10/10/2018  . Unilateral primary osteoarthritis, left knee 10/10/2018  . Paroxysmal SVT (supraventricular tachycardia) ([place]) 09/25/2018  . Uncontrolled type 2 diabetes mellitus with hyperglycemia ([place]) 09/22/2018  . Dementia without behavioral disturbance ([place]) 09/22/2018  . Fall 08/10/2018  . Morbid obesity ([place]) 06/12/2018  . Chronic anemia 09/14/2017  . CKD (chronic kidney disease) stage 3, GFR 30-59 ml/min (HCC) 09/14/2017  . Atrial fibrillation ([place]) 09/14/2017  . Hypoalbuminemia 11/23/2016  . Hypoxia 10/29/2016  . Urinary frequency 09/17/2015  . At high risk for falls 03/30/2015  . Generalized osteoarthritis 07/16/2014  . Urinary incontinence 07/16/2014  . Pulmonary interstitial fibrosis ([place]) 07/03/2014  . Abnormal CXR (chest x-ray) 05/27/2014  . Thyroid mass of unclear etiology 05/07/2014  . Vitamin D deficiency 01/31/2014  . Chronic non-seasonal allergic rhinitis 01/31/2014  . Diverticulosis of sigmoid colon 12/13/2012  . Alzheimer's dementia without behavioral disturbance ([place]) 09/12/2010  . Goiter 06/19/2009  . Essential hypertension 12/11/2008  . Uncontrolled diabetes mellitus with diabetic nephropathy, with long-term current use of insulin ([place]) 09/05/2008  . Hyperlipidemia with target LDL less than 100 07/19/2008  . BPH (benign prostatic hyperplasia) 03/28/2008      Labs reviewed: Basic Metabolic Panel:    Component Value Date/Time   NA 139 07/10/2019 0607   K 4.1  07/26/2019 0607   CL 106 07/01/2019 0607   CO2 26 07/21/2019 0607   GLUCOSE 137 (H) 07/28/2019 0607   BUN 17 07/23/2019 0607   CREATININE 1.52 (H) 07/20/2019 0607   CREATININE 1.61 (H) 04/24/2019 0928   CALCIUM 8.1 (L) 07/07/2019 0607   PROT 7.8 07/08/2019 1039   ALBUMIN 3.2 (L) 07/08/2019 1039   AST 25 07/08/2019 1039   ALT 14 07/08/2019 1039   ALKPHOS 65 07/08/2019 1039   BILITOT 1.0 07/08/2019 1039   GFRNONAA 41 (L) 07/27/2019 0607   GFRNONAA 38 (L) 04/24/2019 0928   GFRAA 47 (L) 07/23/2019 0607   GFRAA 44 (L) 04/24/2019 6294  Recent Labs    08/11/18 0912  09/24/18 0603 09/25/18 0605  07/10/19 0600 07/12/19 0438 07/12/2019 0607  NA 139   < > 135 135   < > 140 139 139  K 4.4   < > 4.7 4.5   < > 4.3 4.1 4.1  CL 106   < > 104 104   < > 107 108 106  CO2 26   < > 24 23   < > 22 25 26   GLUCOSE 119*   < > 154* 148*   < > 146* 116* 137*  BUN 19   < > 20 21   < > 16 25* 17  CREATININE 1.68*   < > 1.74* 1.83*   < > 1.41* 1.81* 1.52*  CALCIUM 8.2*   < > 8.0* 8.1*   < > 8.7* 8.1* 8.1*  MG 2.1  --  2.3 2.2  --   --   --   --    < > = values in this interval not displayed.   Liver Function Tests: Recent Labs    11/21/18 0036 04/24/19 0928 05/22/19 0151 07/08/19 1039  AST 22 22 28 25   ALT 17 12 16 14   ALKPHOS 68  --  60 65  BILITOT 0.8 0.9 0.7 1.0  PROT 7.3 7.2 6.7 7.8  ALBUMIN 3.1*  --  2.9* 3.2*   No results for input(s): LIPASE, AMYLASE in the last 8760 hours. Recent Labs    09/22/18 0953 07/11/19 1305  AMMONIA 10 14   CBC: Recent Labs    04/24/19 0928 05/22/19 0151 07/08/19 1039  07/10/19 0600 07/12/19 0438 07/26/2019 0607  WBC 4.6 5.0 5.8   < > 5.7 5.3 4.6  NEUTROABS 1,730 2.9 3.5  --   --   --   --   HGB 11.7* 10.8* 11.3*   < > 12.6* 10.8* 10.8*  HCT 35.6* 33.5* 35.8*   < > 40.2 33.5* 34.3*  MCV 96.5 98.2 100.0   < > 98.8 98.8 100.6*  PLT 167 150 192   < > 194 188 191   < > = values in this interval not displayed.   Lipid Recent Labs    09/24/18  0603 04/24/19 0928  CHOL 112 130  HDL 27* 34*  LDLCALC 65 76  TRIG 98 115    Cardiac Enzymes: Recent Labs    09/22/18 0643 09/22/18 0953 09/22/18 1217 11/21/18 0036 11/21/18 0037 07/08/19 1039  CKTOTAL  --  61  --   --  99 59  TROPONINI <0.03  --  <0.03 <0.03  --   --    BNP: Recent Labs    09/21/18 1914 11/21/18 0224 07/12/19 0438  BNP 120.0* 208.0* 46.0   Lab Results  Component Value Date   MICROALBUR 1.3 04/24/2019   Lab Results  Component Value Date   HGBA1C 10.0 (H) 04/24/2019   Lab Results  Component Value Date   TSH 0.400 07/11/2019   Lab Results  Component Value Date   OMBTDHRC16 384 09/22/2018   Lab Results  Component Value Date   FOLATE 11.7 09/22/2018   Lab Results  Component Value Date   IRON 47 09/22/2018   TIBC 263 09/22/2018   FERRITIN 592 (H) 09/22/2018    Imaging and Procedures obtained prior to SNF admission: No results found.   Not all labs, radiology exams or other studies done during hospitalization come through on my EPIC note; however they are reviewed by me.  Assessment and Plan  Frequent falls- CT head with no acute abnormalities, MRI not possible because patient cannot lay down flat, EEG with some mild slowing and no acute changeS SNF-admitted for OT/PT  Diabetes mellitus type 2-blood sugars not controlled so glipizide and Lantus Solostar glargine insulin was DC'd SNF- continue Tradjenta 5 mg daily; blood sugars not well controlled, most all are in the 250 range so recommend 6 units of Lantus nightly  Persistent atrial fibrillation SNF- continue to atenolol 25 mg daily, patient is on no anticoagulation secondary to fall risk  Hyperlipidemia SNF-not stated as uncontrolled;; continue Mevacor 40 mg daily  Dementia SNF- appears controlled; continue Aricept 10 mg nightly  BPH SNF- not stated with obstruction continue Flomax 0.4 mg nightly   Time spent greater than 45 minutes;> 50% of time with patient was  spent reviewing records, labs, tests and studies, counseling and developing plan of care  Hennie Duos, MD

## 2019-07-20 ENCOUNTER — Other Ambulatory Visit: Payer: Self-pay | Admitting: Family Medicine

## 2019-07-21 ENCOUNTER — Encounter: Payer: Self-pay | Admitting: Internal Medicine

## 2019-07-31 DEATH — deceased

## 2019-08-01 ENCOUNTER — Non-Acute Institutional Stay (SKILLED_NURSING_FACILITY): Payer: Medicare HMO | Admitting: Adult Health

## 2019-08-01 ENCOUNTER — Encounter: Payer: Self-pay | Admitting: Adult Health

## 2019-08-01 DIAGNOSIS — E1122 Type 2 diabetes mellitus with diabetic chronic kidney disease: Secondary | ICD-10-CM | POA: Diagnosis not present

## 2019-08-01 DIAGNOSIS — N183 Chronic kidney disease, stage 3 unspecified: Secondary | ICD-10-CM

## 2019-08-01 DIAGNOSIS — R5381 Other malaise: Secondary | ICD-10-CM | POA: Diagnosis not present

## 2019-08-01 DIAGNOSIS — G301 Alzheimer's disease with late onset: Secondary | ICD-10-CM | POA: Diagnosis not present

## 2019-08-01 DIAGNOSIS — I129 Hypertensive chronic kidney disease with stage 1 through stage 4 chronic kidney disease, or unspecified chronic kidney disease: Secondary | ICD-10-CM

## 2019-08-01 DIAGNOSIS — F0280 Dementia in other diseases classified elsewhere without behavioral disturbance: Secondary | ICD-10-CM

## 2019-08-01 NOTE — Progress Notes (Signed)
Location:    [place] Room Number: 129/P Place of Service:  SNF (31)   CODE STATUS: Full Code  Allergies  Allergen Reactions  . Zosyn [Piperacillin Sod-Tazobactam So] Anaphylaxis, Swelling and Other (See Comments)    Facial swelling Has patient had a PCN reaction causing immediate rash, facial/tongue/throat swelling, SOB or lightheadedness with hypotension: Yes Has patient had a PCN reaction causing severe rash involving mucus membranes or skin necrosis: No Has patient had a PCN reaction that required hospitalization: No Has patient had a PCN reaction occurring within the last 10 years: Yes If all of the above answers are "NO", then may proceed with Cephalosporin use.   . Ace Inhibitors Cough  . Vancomycin Swelling    Facial swelling    Chief Complaint  Patient presents with  . Acute Visit    Care Plan Meeting    HPI:  We have come together for his care plan meeting. He has family present via phone. BIMS 3/15 mood 4/30. Is being seen by all 3 disciplines; he has had 2 choking episodes. He falls asleep; has a poor attention span; is hard to keep him engaged. He requires max assist of 2 people. He has done well in the past. He is incontinent of bladder and bowel. His goal is to return back home.    Past Medical History:  Diagnosis Date  . Acute cholecystitis 11/2012   Drained percutaneously  . Anxiety   . BPH (benign prostatic hypertrophy)   . Deep venous thrombosis (HCC)    Left leg, diagnosed 7/12  . Dementia [facility])    Needs supervision for safety per son - POA  . Diverticulitis   . Essential hypertension   . GERD (gastroesophageal reflux disease)   . History of pneumonia    Prior to 2012  . Incontinence of urine   . Mixed hyperlipidemia   . Osteoarthritis   . Pulmonary embolism ([place])    Diagnosed 7/12  . Type 2 diabetes mellitus ([place])     Past Surgical History:  Procedure Laterality Date  . CARPAL TUNNEL RELEASE    . COLONOSCOPY   08/31/2012   Procedure: COLONOSCOPY;  Surgeon: Rogene Houston, MD;  [facility];  Service: Endoscopy;  Laterality: N/A;  830  . CYSTOSCOPY WITH INJECTION N/A 05/15/2018   Procedure: CYSTOSCOPY WITH BOTOX INJECTION;  Surgeon: Cleon Gustin, MD;  Location: [facility] ORS;  Service: Urology;  Laterality: N/A;  . CYSTOSCOPY WITH INJECTION N/A 11/10/2018   Procedure: CYSTOSCOPY WITH BOTOX  INJECTION;  Surgeon: Cleon Gustin, MD;  Location: [facility] ORS;  Service: Urology;  Laterality: N/A;  30 MINS  . CYSTOSCOPY WITH INSERTION OF UROLIFT N/A 09/02/2016   Procedure: CYSTOSCOPY WITH INSERTION OF UROLIFT;  Surgeon: Cleon Gustin, MD;  [facility];  Service: Urology;  Laterality: N/A;  . EYE SURGERY Bilateral    bilateral cataract  . Right total knee replacement  04/23/2011  . SPINE SURGERY  prior to 2012   Neck fusion  . UMBILICAL HERNIA REPAIR     umbilical    Social History   Socioeconomic History  . Marital status: Widowed    Spouse name: Not on file  . Number of children: Not on file  . Years of education: Not on file  . Highest education level: Not on file  Occupational History  . Not on file  Social Needs  . Financial resource strain: Not on file  . Food insecurity  Worry: Not on file    Inability: Not on file  . Transportation needs    Medical: Not on file    Non-medical: Not on file  Tobacco Use  . Smoking status: Never Smoker  . Smokeless tobacco: Never Used  Substance and Sexual Activity  . Alcohol use: No  . Drug use: No  . Sexual activity: Never  Lifestyle  . Physical activity    Days per week: Not on file    Minutes per session: Not on file  . Stress: Not on file  Relationships  . Social Herbalist on phone: Not on file    Gets together: Not on file    Attends religious service: Not on file    Active member of club or organization: Not on file    Attends meetings of clubs or organizations: Not on file     Relationship status: Not on file  . Intimate partner violence    Fear of current or ex partner: Not on file    Emotionally abused: Not on file    Physically abused: Not on file    Forced sexual activity: Not on file  Other Topics Concern  . Not on file  Social History Narrative  . Not on file   Family History  Problem Relation Age of Onset  . Cancer Mother        Stomach  . Diabetes Sister   . Hypertension Sister   . Diabetes Brother   . Cancer Brother        Gallbladder  . Seizures Son        due to meningitis as a child      VITAL SIGNS BP 101/66   Pulse (!) 103   Temp 98.1 F (36.7 C) (Oral)   Resp 18   Ht 5\' 10"  (1.778 m)   Wt 234 lb 12.8 oz (106.5 kg)   SpO2 95%   BMI 33.69 kg/m   Facility-Administered Encounter Medications as of 08/01/2019  Medication  . botulinum toxin Type A (BOTOX) injection 100 Units   Outpatient Encounter Medications as of 08/01/2019  Medication Sig  . acetaminophen (TYLENOL) 500 MG tablet Take 1,000 mg by mouth every 6 (six) hours as needed for mild pain, moderate pain or headache.   [name] atenolol (TENORMIN) 25 MG tablet Take 25 mg by mouth daily.  [name] donepezil (ARICEPT) 10 MG tablet TAKE 1 TABLET AT BEDTIME  . Ferrous Sulfate (IRON) 325 (65 Fe) MG TABS Take 325 mg by mouth daily.   . Glucerna (GLUCERNA) LIQD Take 237 mLs by mouth daily. Due to poor meal intake  . insulin glargine (LANTUS) 100 UNIT/ML injection Inject 0.05 mLs (5 Units total) into the skin every morning.  . linagliptin (TRADJENTA) 5 MG TABS tablet Take 1 tablet (5 mg total) by mouth daily.  [name] loratadine (CLARITIN) 10 MG tablet Take 10 mg by mouth every morning.   . lovastatin (MEVACOR) 40 MG tablet TAKE 1 TABLET AT BEDTIME  . magnesium hydroxide (MILK OF MAGNESIA) 400 MG/5ML suspension Take 30 mLs by mouth daily as needed for mild constipation. For constipation  . NON FORMULARY Diet Type Change: Dysphagia 3, thin liquids (moisten meats)  . omeprazole (PRILOSEC) 40 MG capsule  TAKE 1 CAPSULE TWICE DAILY  . OXYGEN Inhale 2 L/min into the lungs as needed. May give oxygen at 2 L/min via nasal cannula, titrating to keep O2 saturation over 90%.  Inform physician ASAP for acute onset dyspnea or O2  saturation < 88%  . sertraline (ZOLOFT) 50 MG tablet Take 1 tablet (50 mg total) by mouth daily.  . tamsulosin (FLOMAX) 0.4 MG CAPS capsule TAKE 1 CAPSULE EVERY DAY  . traMADol (ULTRAM) 50 MG tablet Take one tablet by mouth two times daily for chronic joint pain  . Trospium Chloride 60 MG CP24 Take 60 mg by mouth daily.   . Vitamin D, Ergocalciferol, (DRISDOL) 1.25 MG (50000 UT) CAPS capsule Take 1 capsule (50,000 Units total) by mouth every 7 (seven) days.     SIGNIFICANT DIAGNOSTIC EXAMS  PREVIOUS;   07-08-19: ct of head an cervical spine:  1. No evidence of significant acute traumatic injury to the skull, brain or cervical spine. 2. Mild cerebral atrophy with extensive chronic microvascular ischemic changes in the cerebral white matter, as above. 3. Status post ACDF at C4-C6 with multilevel degenerative disc disease and cervical spondylosis, as above  07-08-19: chest x-ray:  1. No signs of significant acute traumatic injury to the thorax. 2. The appearance the chest is unusual. The possibility of congestive heart failure is considered, however, clinical correlation for signs and symptoms of severe acute bronchitis is recommended. 3. Aortic atherosclerosis.   07-08-19: bilateral hip x-ray: No acute osseous abnormality.   07-08-19; lumbar spine x-ray: Spondylosis, without acute osseous abnormality. Aortic Atherosclerosis   07-11-19: chest x-ray: Cardiomegaly with mild vascular congestion. No focal consolidation.   NO NEW EXAMS   LABS REVIEWED PREVIOUS;   04-24-19: hgb a1c 10.0 chol 130; ldl 76; tirg 115; hdl 34; vit D 51 07-08-19: wbc 5.8; hgb 11.3; hct 35.8; mcv 100.0; plt 192; glucose 123; bun 15; creat 1.58; k+ 5.1; na++ 140; ca 8.6 liver normal albumin 3.2; urine culture:  no growth 07-10-19: wbc 5.7; hgb 12.6; hct 40.2; mcv 98.8 plt 194; glucose 146; bun 16; creat 1.41; k+ 4.3; na++ 140 ca 8.7 07-11-19: tsh 0.400 ammonia 14 07/30/2019: wbc 4.6; hgb 10.8; hct 34.3; mvc 100.6 plt 191; glucose 137; bun 17; creat 1.52; k+ 4.1; na+ 139; ca 8.1   NO NEW LABS.    Review of Systems  Constitutional: Negative for malaise/fatigue.  Respiratory: Negative for cough and shortness of breath.   Cardiovascular: Negative for chest pain, palpitations and leg swelling.  Gastrointestinal: Negative for abdominal pain, constipation and heartburn.  Musculoskeletal: Negative for back pain, joint pain and myalgias.  Skin: Negative.   Neurological: Negative for dizziness.  Psychiatric/Behavioral: The patient is not nervous/anxious.     Physical Exam Constitutional:      General: He is not in acute distress.    Appearance: He is well-developed. He is obese. He is not diaphoretic.  Eyes:     Comments: History of bilateral cataract removal    Neck:     Thyroid: No thyromegaly.  Cardiovascular:     Rate and Rhythm: Normal rate and regular rhythm.     Heart sounds: Normal heart sounds.  Pulmonary:     Effort: Pulmonary effort is normal. No respiratory distress.     Breath sounds: Normal breath sounds.  Abdominal:     General: Bowel sounds are normal. There is no distension.     Palpations: Abdomen is soft.     Tenderness: There is no abdominal tenderness.  Musculoskeletal:     Right lower leg: Edema present.     Left lower leg: Edema present.     Comments: Chronic 2+ bilateral lower extremity edema Is able to move all extremities  History of right total knee replacement and  neck fusion    Lymphadenopathy:     Cervical: No cervical adenopathy.  Skin:    General: Skin is warm and dry.     Comments: Bilateral lower extremities discolored   Neurological:     Mental Status: He is alert. Mental status is at baseline.  Psychiatric:        Mood and Affect: Mood normal.        ASSESSMENT/ PLAN:  TODAY;   1. Late onset alzheimer's disease with behavioral disturbances 2. Physical deconditioning 3. Hypertension associated with stage 3 chronic kidney disease due to type 2 diabetes mellitus  Will continue therapy as directed I will go see him in therapy  Will continue current medications Will continue to monitor his status.       MD is aware of resident's narcotic use and is in agreement with current plan of care. We will attempt to wean resident as appropriate.  Ok Edwards NP [facility]  Contact [phone] Monday through Friday 8am- 5pm  After hours call [phone]

## 2019-08-02 ENCOUNTER — Encounter: Payer: Self-pay | Admitting: Adult Health

## 2019-08-02 ENCOUNTER — Non-Acute Institutional Stay (SKILLED_NURSING_FACILITY): Payer: Medicare HMO | Admitting: Adult Health

## 2019-08-02 DIAGNOSIS — G301 Alzheimer's disease with late onset: Secondary | ICD-10-CM | POA: Diagnosis not present

## 2019-08-02 DIAGNOSIS — R5381 Other malaise: Secondary | ICD-10-CM

## 2019-08-02 DIAGNOSIS — F0280 Dementia in other diseases classified elsewhere without behavioral disturbance: Secondary | ICD-10-CM

## 2019-08-02 DIAGNOSIS — B370 Candidal stomatitis: Secondary | ICD-10-CM

## 2019-08-02 DIAGNOSIS — J8410 Pulmonary fibrosis, unspecified: Secondary | ICD-10-CM | POA: Diagnosis not present

## 2019-08-02 NOTE — Progress Notes (Signed)
Location:    [facility] Room Number: 129/P Place of Service:  SNF (31)   CODE STATUS: Full Code  Allergies  Allergen Reactions  . Zosyn [Piperacillin Sod-Tazobactam So] Anaphylaxis, Swelling and Other (See Comments)    Facial swelling Has patient had a PCN reaction causing immediate rash, facial/tongue/throat swelling, SOB or lightheadedness with hypotension: Yes Has patient had a PCN reaction causing severe rash involving mucus membranes or skin necrosis: No Has patient had a PCN reaction that required hospitalization: No Has patient had a PCN reaction occurring within the last 10 years: Yes If all of the above answers are "NO", then may proceed with Cephalosporin use.   . Ace Inhibitors Cough  . Vancomycin Swelling    Facial swelling    Chief Complaint  Patient presents with  . Acute Visit    Therapy Concerns,    HPI:  I have been asked to see him with therapy. He is having difficulty keeping his focus; staying awake. He was able to stand for approximately 2 minutes with max assist of 2 therapist and frequent cueing. His speech is thick/slurred. Nursing staff report that he has significant oral thrush. He is on routine ultram; there are no reports of pain. There are no reports of fevers present.   Past Medical History:  Diagnosis Date  . Acute cholecystitis 11/2012   Drained percutaneously  . Anxiety   . BPH (benign prostatic hypertrophy)   . Deep venous thrombosis (HCC)    Left leg, diagnosed 7/12  . Dementia [facility])    Needs supervision for safety per son - POA  . Diverticulitis   . Essential hypertension   . GERD (gastroesophageal reflux disease)   . History of pneumonia    Prior to 2012  . Incontinence of urine   . Mixed hyperlipidemia   . Osteoarthritis   . Pulmonary embolism ([place])    Diagnosed 7/12  . Type 2 diabetes mellitus ([place])     Past Surgical History:  Procedure Laterality Date  . CARPAL TUNNEL RELEASE    . COLONOSCOPY   08/31/2012   Procedure: COLONOSCOPY;  Surgeon: Rogene Houston, MD;  Location: [facility];  Service: Endoscopy;  Laterality: N/A;  830  . CYSTOSCOPY WITH INJECTION N/A 05/15/2018   Procedure: CYSTOSCOPY WITH BOTOX INJECTION;  Surgeon: Cleon Gustin, MD;  Location: [facility] ORS;  Service: Urology;  Laterality: N/A;  . CYSTOSCOPY WITH INJECTION N/A 11/10/2018   Procedure: CYSTOSCOPY WITH BOTOX  INJECTION;  Surgeon: Cleon Gustin, MD;  Location: [facility] ORS;  Service: Urology;  Laterality: N/A;  30 MINS  . CYSTOSCOPY WITH INSERTION OF UROLIFT N/A 09/02/2016   Procedure: CYSTOSCOPY WITH INSERTION OF UROLIFT;  Surgeon: Cleon Gustin, MD;  Location: [facility];  Service: Urology;  Laterality: N/A;  . EYE SURGERY Bilateral    bilateral cataract  . Right total knee replacement  04/23/2011  . SPINE SURGERY  prior to 2012   Neck fusion  . UMBILICAL HERNIA REPAIR     umbilical    Social History   Socioeconomic History  . Marital status: Widowed    Spouse name: Not on file  . Number of children: Not on file  . Years of education: Not on file  . Highest education level: Not on file  Occupational History  . Not on file  Social Needs  . Financial resource strain: Not on file  . Food insecurity    Worry: Not on file  Inability: Not on file  . Transportation needs    Medical: Not on file    Non-medical: Not on file  Tobacco Use  . Smoking status: Never Smoker  . Smokeless tobacco: Never Used  Substance and Sexual Activity  . Alcohol use: No  . Drug use: No  . Sexual activity: Never  Lifestyle  . Physical activity    Days per week: Not on file    Minutes per session: Not on file  . Stress: Not on file  Relationships  . Social Herbalist on phone: Not on file    Gets together: Not on file    Attends religious service: Not on file    Active member of club or organization: Not on file    Attends meetings of clubs or organizations: Not on file     Relationship status: Not on file  . Intimate partner violence    Fear of current or ex partner: Not on file    Emotionally abused: Not on file    Physically abused: Not on file    Forced sexual activity: Not on file  Other Topics Concern  . Not on file  Social History Narrative  . Not on file   Family History  Problem Relation Age of Onset  . Cancer Mother        Stomach  . Diabetes Sister   . Hypertension Sister   . Diabetes Brother   . Cancer Brother        Gallbladder  . Seizures Son        due to meningitis as a child      VITAL SIGNS BP 101/66   Pulse (!) 103   Temp 98.9 F (37.2 C) (Oral)   Resp 18   Ht 5\' 10"  (1.778 m)   Wt 232 lb 6.4 oz (105.4 kg)   SpO2 95%   BMI 33.35 kg/m   Facility-Administered Encounter Medications as of 08/02/2019  Medication  . botulinum toxin Type A (BOTOX) injection 100 Units   Outpatient Encounter Medications as of 08/02/2019  Medication Sig  . acetaminophen (TYLENOL) 500 MG tablet Take 1,000 mg by mouth every 6 (six) hours as needed for mild pain, moderate pain or headache.   [name] atenolol (TENORMIN) 25 MG tablet Take 25 mg by mouth daily.  [name] donepezil (ARICEPT) 10 MG tablet TAKE 1 TABLET AT BEDTIME  . Ferrous Sulfate (IRON) 325 (65 Fe) MG TABS Take 325 mg by mouth daily.   . Glucerna (GLUCERNA) LIQD Take 237 mLs by mouth daily. Due to poor meal intake  . insulin glargine (LANTUS) 100 UNIT/ML injection Inject 0.05 mLs (5 Units total) into the skin every morning.  . linagliptin (TRADJENTA) 5 MG TABS tablet Take 1 tablet (5 mg total) by mouth daily.  [name] loratadine (CLARITIN) 10 MG tablet Take 10 mg by mouth every morning.   . lovastatin (MEVACOR) 40 MG tablet TAKE 1 TABLET AT BEDTIME  . magnesium hydroxide (MILK OF MAGNESIA) 400 MG/5ML suspension Take 30 mLs by mouth daily as needed for mild constipation. For constipation  . NON FORMULARY Diet Type Change: Dysphagia 3, thin liquids (moisten meats)  . omeprazole (PRILOSEC) 40 MG capsule  TAKE 1 CAPSULE TWICE DAILY  . OXYGEN Inhale 2 L/min into the lungs as needed. May give oxygen at 2 L/min via nasal cannula, titrating to keep O2 saturation over 90%.  Inform physician ASAP for acute onset dyspnea or O2 saturation < 88%  . sertraline (ZOLOFT)  50 MG tablet Take 1 tablet (50 mg total) by mouth daily.  . tamsulosin (FLOMAX) 0.4 MG CAPS capsule TAKE 1 CAPSULE EVERY DAY  . traMADol (ULTRAM) 50 MG tablet Take one tablet by mouth two times daily for chronic joint pain  . Trospium Chloride 60 MG CP24 Take 60 mg by mouth daily.   . Vitamin D, Ergocalciferol, (DRISDOL) 1.25 MG (50000 UT) CAPS capsule Take 1 capsule (50,000 Units total) by mouth every 7 (seven) days.     SIGNIFICANT DIAGNOSTIC EXAMS   PREVIOUS;   07-08-19: ct of head an cervical spine:  1. No evidence of significant acute traumatic injury to the skull, brain or cervical spine. 2. Mild cerebral atrophy with extensive chronic microvascular ischemic changes in the cerebral white matter, as above. 3. Status post ACDF at C4-C6 with multilevel degenerative disc disease and cervical spondylosis, as above  07-08-19: chest x-ray:  1. No signs of significant acute traumatic injury to the thorax. 2. The appearance the chest is unusual. The possibility of congestive heart failure is considered, however, clinical correlation for signs and symptoms of severe acute bronchitis is recommended. 3. Aortic atherosclerosis.   07-08-19: bilateral hip x-ray: No acute osseous abnormality.   07-08-19; lumbar spine x-ray: Spondylosis, without acute osseous abnormality. Aortic Atherosclerosis   07-11-19: chest x-ray: Cardiomegaly with mild vascular congestion. No focal consolidation.   NO NEW EXAMS   LABS REVIEWED PREVIOUS;   04-24-19: hgb a1c 10.0 chol 130; ldl 76; tirg 115; hdl 34; vit D 51 07-08-19: wbc 5.8; hgb 11.3; hct 35.8; mcv 100.0; plt 192; glucose 123; bun 15; creat 1.58; k+ 5.1; na++ 140; ca 8.6 liver normal albumin 3.2; urine culture:  no growth 07-10-19: wbc 5.7; hgb 12.6; hct 40.2; mcv 98.8 plt 194; glucose 146; bun 16; creat 1.41; k+ 4.3; na++ 140 ca 8.7 07-11-19: tsh 0.400 ammonia 14 06/30/2019: wbc 4.6; hgb 10.8; hct 34.3; mvc 100.6 plt 191; glucose 137; bun 17; creat 1.52; k+ 4.1; na+ 139; ca 8.1   NO NEW LABS.    Review of Systems  Unable to perform ROS: Medical condition    Physical Exam Constitutional:      General: He is not in acute distress.    Appearance: He is well-developed. He is obese. He is not diaphoretic.  HENT:     Mouth/Throat:     Comments: Significant oral thrush Eyes:     Comments: History of bilateral cataract removal   Neck:     Musculoskeletal: Neck supple.     Thyroid: No thyromegaly.  Cardiovascular:     Rate and Rhythm: Normal rate and regular rhythm.     Pulses: Normal pulses.     Heart sounds: Normal heart sounds.  Pulmonary:     Effort: Pulmonary effort is normal. No respiratory distress.     Breath sounds: Normal breath sounds.  Abdominal:     General: Bowel sounds are normal. There is no distension.     Palpations: Abdomen is soft.     Tenderness: There is no abdominal tenderness.  Musculoskeletal:     Right lower leg: Edema present.     Left lower leg: Edema present.     Comments: Chronic 2+ bilateral lower extremity edema Is able to move all extremities  History of right total knee replacement and neck fusion     Lymphadenopathy:     Cervical: No cervical adenopathy.  Skin:    General: Skin is warm and dry.     Comments: Bilateral lower  extremities discolored   Neurological:     Mental Status: He is alert.     Comments: Obtunded       ASSESSMENT/ PLAN:  TODAY   1. Pulmonary interstitial lung disease  2. Late onset alzheimer's disease without behavioral disturbance 3. Physical deconditioning 4. Oral thrush  Will begin diflucan 100 mg today then 50 mg daily through 08-16-19 Will change ultram to 50 mg twice daily as needed through 08-07-19 Will continue  therapy as directed  Will continue to monitor his status.        MD is aware of resident's narcotic use and is in agreement with current plan of care. We will attempt to wean resident as appropriate.  Ok Edwards NP [facility]  Contact [phone] Monday through Friday 8am- 5pm  After hours call [phone]

## 2019-08-03 DIAGNOSIS — G308 Other Alzheimer's disease: Secondary | ICD-10-CM | POA: Diagnosis not present

## 2019-08-03 DIAGNOSIS — E1129 Type 2 diabetes mellitus with other diabetic kidney complication: Secondary | ICD-10-CM | POA: Diagnosis not present

## 2019-08-03 DIAGNOSIS — R5381 Other malaise: Secondary | ICD-10-CM | POA: Insufficient documentation

## 2019-08-03 DIAGNOSIS — F329 Major depressive disorder, single episode, unspecified: Secondary | ICD-10-CM | POA: Diagnosis not present

## 2019-08-03 DIAGNOSIS — R0902 Hypoxemia: Secondary | ICD-10-CM | POA: Diagnosis not present

## 2019-08-04 DIAGNOSIS — B370 Candidal stomatitis: Secondary | ICD-10-CM | POA: Insufficient documentation

## 2019-08-08 ENCOUNTER — Ambulatory Visit (HOSPITAL_COMMUNITY): Payer: Medicare HMO | Attending: Adult Health

## 2019-08-08 ENCOUNTER — Non-Acute Institutional Stay (SKILLED_NURSING_FACILITY): Payer: Medicare HMO | Admitting: Adult Health

## 2019-08-08 ENCOUNTER — Encounter (HOSPITAL_COMMUNITY)
Admission: RE | Admit: 2019-08-08 | Discharge: 2019-08-08 | Disposition: A | Discharge status: Home / Self Care | Payer: Medicare HMO | Source: Skilled Nursing Facility | Attending: Adult Health | Admitting: Adult Health

## 2019-08-08 ENCOUNTER — Encounter: Payer: Self-pay | Admitting: Adult Health

## 2019-08-08 DIAGNOSIS — R5381 Other malaise: Secondary | ICD-10-CM | POA: Diagnosis not present

## 2019-08-08 DIAGNOSIS — R062 Wheezing: Secondary | ICD-10-CM | POA: Insufficient documentation

## 2019-08-08 DIAGNOSIS — N179 Acute kidney failure, unspecified: Secondary | ICD-10-CM | POA: Diagnosis not present

## 2019-08-08 DIAGNOSIS — F339 Major depressive disorder, recurrent, unspecified: Secondary | ICD-10-CM | POA: Diagnosis not present

## 2019-08-08 DIAGNOSIS — R5383 Other fatigue: Secondary | ICD-10-CM | POA: Diagnosis not present

## 2019-08-08 DIAGNOSIS — I129 Hypertensive chronic kidney disease with stage 1 through stage 4 chronic kidney disease, or unspecified chronic kidney disease: Secondary | ICD-10-CM | POA: Insufficient documentation

## 2019-08-08 DIAGNOSIS — N183 Chronic kidney disease, stage 3 (moderate): Secondary | ICD-10-CM

## 2019-08-08 LAB — CBC WITH DIFFERENTIAL/PLATELET
Abs Immature Granulocytes: 0.03 10*3/uL (ref 0.00–0.07)
Basophils Absolute: 0 10*3/uL (ref 0.0–0.1)
Basophils Relative: 0 %
Eosinophils Absolute: 0.1 10*3/uL (ref 0.0–0.5)
Eosinophils Relative: 1 %
HCT: 33.1 % — ABNORMAL LOW (ref 39.0–52.0)
Hemoglobin: 10.4 g/dL — ABNORMAL LOW (ref 13.0–17.0)
Immature Granulocytes: 0 %
Lymphocytes Relative: 17 %
Lymphs Abs: 1.2 10*3/uL (ref 0.7–4.0)
MCH: 31.5 pg (ref 26.0–34.0)
MCHC: 31.4 g/dL (ref 30.0–36.0)
MCV: 100.3 fL — ABNORMAL HIGH (ref 80.0–100.0)
Monocytes Absolute: 0.6 10*3/uL (ref 0.1–1.0)
Monocytes Relative: 9 %
Neutro Abs: 5.3 10*3/uL (ref 1.7–7.7)
Neutrophils Relative %: 73 %
Platelets: 227 10*3/uL (ref 150–400)
RBC: 3.3 MIL/uL — ABNORMAL LOW (ref 4.22–5.81)
RDW: 13.4 % (ref 11.5–15.5)
WBC: 7.3 10*3/uL (ref 4.0–10.5)
nRBC: 0 % (ref 0.0–0.2)

## 2019-08-08 LAB — BASIC METABOLIC PANEL
Anion gap: 7 (ref 5–15)
BUN: 43 mg/dL — ABNORMAL HIGH (ref 8–23)
CO2: 23 mmol/L (ref 22–32)
Calcium: 8.4 mg/dL — ABNORMAL LOW (ref 8.9–10.3)
Chloride: 104 mmol/L (ref 98–111)
Creatinine, Ser: 2.27 mg/dL — ABNORMAL HIGH (ref 0.61–1.24)
GFR calc Af Amer: 29 mL/min — ABNORMAL LOW (ref >60)
GFR calc non Af Amer: 25 mL/min — ABNORMAL LOW (ref >60)
Glucose, Bld: 292 mg/dL — ABNORMAL HIGH (ref 70–99)
Potassium: 5.2 mmol/L — ABNORMAL HIGH (ref 3.5–5.1)
Sodium: 134 mmol/L — ABNORMAL LOW (ref 135–145)

## 2019-08-08 NOTE — Progress Notes (Signed)
Location:    [facility] Room Number: 129/P Place of Service:  SNF (31)   CODE STATUS: Full Code  Allergies  Allergen Reactions  . Zosyn [Piperacillin Sod-Tazobactam So] Anaphylaxis, Swelling and Other (See Comments)    Facial swelling Has patient had a PCN reaction causing immediate rash, facial/tongue/throat swelling, SOB or lightheadedness with hypotension: Yes Has patient had a PCN reaction causing severe rash involving mucus membranes or skin necrosis: No Has patient had a PCN reaction that required hospitalization: No Has patient had a PCN reaction occurring within the last 10 years: Yes If all of the above answers are "NO", then may proceed with Cephalosporin use.   . Ace Inhibitors Cough  . Vancomycin Swelling    Facial swelling    Chief Complaint  Patient presents with  . Acute Visit    concerns    HPI:  He is having a great difficulty participating in therapy. He has a poor ability to stand. He has increased lethargy with decreased 02 sats. His appetite is poor with poor fluid intake. He did tell me that he has pain and pointed to his knees. He is presently on 02. There are no reports of fevers. He did have a chest x-ray and blood work which demonstrates acute renal failure.    Past Medical History:  Diagnosis Date  . Acute cholecystitis 11/2012   Drained percutaneously  . Anxiety   . BPH (benign prostatic hypertrophy)   . Deep venous thrombosis (HCC)    Left leg, diagnosed 7/12  . Dementia [facility])    Needs supervision for safety per son - POA  . Diverticulitis   . Essential hypertension   . GERD (gastroesophageal reflux disease)   . History of pneumonia    Prior to 2012  . Incontinence of urine   . Mixed hyperlipidemia   . Osteoarthritis   . Pulmonary embolism ([place])    Diagnosed 7/12  . Type 2 diabetes mellitus ([place])     Past Surgical History:  Procedure Laterality Date  . CARPAL TUNNEL RELEASE    . COLONOSCOPY  08/31/2012   Procedure: COLONOSCOPY;  Surgeon: Rogene Houston, MD;  [facility];  Service: Endoscopy;  Laterality: N/A;  830  . CYSTOSCOPY WITH INJECTION N/A 05/15/2018   Procedure: CYSTOSCOPY WITH BOTOX INJECTION;  Surgeon: Cleon Gustin, MD;  Location: [facility] OR[facility];  Service: Urology;  Laterality: N/A;  . CYSTOSCOPY WITH INJECTION N/A 11/10/2018   Procedure: CYSTOSCOPY WITH BOTOX  INJECTION;  Surgeon: Cleon Gustin, MD;  Location: [facility];  Service: Urology;  Laterality: N/A;  30 MINS  . CYSTOSCOPY WITH INSERTION OF UROLIFT N/A 09/02/2016   Procedure: CYSTOSCOPY WITH INSERTION OF UROLIFT;  Surgeon: Cleon Gustin, MD;  Location: [facility];  Service: Urology;  Laterality: N/A;  . EYE SURGERY Bilateral    bilateral cataract  . Right total knee replacement  04/23/2011  . SPINE SURGERY  prior to 2012   Neck fusion  . UMBILICAL HERNIA REPAIR     umbilical    Social History   Socioeconomic History  . Marital status: Widowed    Spouse name: Not on file  . Number of children: Not on file  . Years of education: Not on file  . Highest education level: Not on file  Occupational History  . Not on file  Social Needs  . Financial resource strain: Not on file  . Food insecurity    Worry: Not on file  Inability: Not on file  . Transportation needs    Medical: Not on file    Non-medical: Not on file  Tobacco Use  . Smoking status: Never Smoker  . Smokeless tobacco: Never Used  Substance and Sexual Activity  . Alcohol use: No  . Drug use: No  . Sexual activity: Never  Lifestyle  . Physical activity    Days per week: Not on file    Minutes per session: Not on file  . Stress: Not on file  Relationships  . Social Herbalist on phone: Not on file    Gets together: Not on file    Attends religious service: Not on file    Active member of club or organization: Not on file    Attends meetings of clubs or organizations: Not on file    Relationship  status: Not on file  . Intimate partner violence    Fear of current or ex partner: Not on file    Emotionally abused: Not on file    Physically abused: Not on file    Forced sexual activity: Not on file  Other Topics Concern  . Not on file  Social History Narrative  . Not on file   Family History  Problem Relation Age of Onset  . Cancer Mother        Stomach  . Diabetes Sister   . Hypertension Sister   . Diabetes Brother   . Cancer Brother        Gallbladder  . Seizures Son        due to meningitis as a child      VITAL SIGNS BP 102/62   Pulse 60   Temp 98.9 F (37.2 C) (Oral)   Resp 20   Ht 5\' 10"  (1.778 m)   Wt 232 lb 6.4 oz (105.4 kg)   SpO2 95%   BMI 33.35 kg/m   Facility-Administered Encounter Medications as of 08/08/2019  Medication  . botulinum toxin Type A (BOTOX) injection 100 Units   Outpatient Encounter Medications as of 08/08/2019  Medication Sig  . acetaminophen (TYLENOL) 500 MG tablet Take 1,000 mg by mouth every 6 (six) hours as needed for mild pain, moderate pain or headache.   [name] atenolol (TENORMIN) 25 MG tablet Take 25 mg by mouth daily.  [name] donepezil (ARICEPT) 10 MG tablet TAKE 1 TABLET AT BEDTIME  . Ferrous Sulfate (IRON) 325 (65 Fe) MG TABS Take 325 mg by mouth daily.   . fluconazole (DIFLUCAN) 50 MG tablet Take 50 mg by mouth daily.  . Glucerna (GLUCERNA) LIQD Take 237 mLs by mouth daily. Due to poor meal intake  . insulin glargine (LANTUS) 100 UNIT/ML injection Inject 0.05 mLs (5 Units total) into the skin every morning.  . Lidocaine 4 % [facility] Instructions: apply 1/2 patch to both knees in the AM and remove at HS for bilateral knee arthritic pain Once A Day  . linagliptin (TRADJENTA) 5 MG TABS tablet Take 1 tablet (5 mg total) by mouth daily.  [name] loratadine (CLARITIN) 10 MG tablet Take 10 mg by mouth every morning.   . lovastatin (MEVACOR) 40 MG tablet TAKE 1 TABLET AT BEDTIME  . magnesium hydroxide (MILK OF MAGNESIA) 400 MG/5ML suspension Take  30 mLs by mouth daily as needed for mild constipation. For constipation  . NON FORMULARY Diet Type Change: Dysphagia 2, thin liquids (moisten meats with gravy) will allow banana cut into pieces upon request  . omeprazole (PRILOSEC) 40 MG  capsule TAKE 1 CAPSULE TWICE DAILY  . OXYGEN Inhale 2 L/min into the lungs as needed. May give oxygen at 2 L/min via nasal cannula, titrating to keep O2 saturation over 90%.  Inform physician ASAP for acute onset dyspnea or O2 saturation < 88%  . sertraline (ZOLOFT) 50 MG tablet Take 1 tablet (50 mg total) by mouth daily.  . tamsulosin (FLOMAX) 0.4 MG CAPS capsule TAKE 1 CAPSULE EVERY DAY  . Trospium Chloride 60 MG CP24 Take 60 mg by mouth daily.   . Vitamin D, Ergocalciferol, (DRISDOL) 1.25 MG (50000 UT) CAPS capsule Take 1 capsule (50,000 Units total) by mouth every 7 (seven) days.  . [DISCONTINUED] traMADol (ULTRAM) 50 MG tablet Take one tablet by mouth two times daily for chronic joint pain     SIGNIFICANT DIAGNOSTIC EXAMS   PREVIOUS;   07-08-19: ct of head an cervical spine:  1. No evidence of significant acute traumatic injury to the skull, brain or cervical spine. 2. Mild cerebral atrophy with extensive chronic microvascular ischemic changes in the cerebral white matter, as above. 3. Status post ACDF at C4-C6 with multilevel degenerative disc disease and cervical spondylosis, as above  07-08-19: chest x-ray:  1. No signs of significant acute traumatic injury to the thorax. 2. The appearance the chest is unusual. The possibility of congestive heart failure is considered, however, clinical correlation for signs and symptoms of severe acute bronchitis is recommended. 3. Aortic atherosclerosis.   07-08-19: bilateral hip x-ray: No acute osseous abnormality.   07-08-19; lumbar spine x-ray: Spondylosis, without acute osseous abnormality. Aortic Atherosclerosis   07-11-19: chest x-ray: Cardiomegaly with mild vascular congestion. No focal consolidation.    TODAY;   08-08-19: chest x-ray: No active cardiopulmonary disease.  LABS REVIEWED PREVIOUS;   04-24-19: hgb a1c 10.0 chol 130; ldl 76; tirg 115; hdl 34; vit D 51 07-08-19: wbc 5.8; hgb 11.3; hct 35.8; mcv 100.0; plt 192; glucose 123; bun 15; creat 1.58; k+ 5.1; na++ 140; ca 8.6 liver normal albumin 3.2; urine culture: no growth 07-10-19: wbc 5.7; hgb 12.6; hct 40.2; mcv 98.8 plt 194; glucose 146; bun 16; creat 1.41; k+ 4.3; na++ 140 ca 8.7 07-11-19: tsh 0.400 ammonia 14 07/01/2019: wbc 4.6; hgb 10.8; hct 34.3; mvc 100.6 plt 191; glucose 137; bun 17; creat 1.52; k+ 4.1; na+ 139; ca 8.1   TODAY;   08-08-19: wbc 7.3; hgb 10.4; hct 33.1; mcv 100.3; plt 227; glucose 292; bun 43; creat 2.27; k+ 5.2; na++ 134; ca 8.4    Review of Systems  Constitutional: Negative for malaise/fatigue.  Respiratory: Negative for cough.   Cardiovascular: Negative for chest pain.  Gastrointestinal: Negative for abdominal pain.  Musculoskeletal: Positive for joint pain. Negative for back pain and myalgias.       Bilateral knee pain   Skin: Negative.   Neurological: Negative for dizziness.  Psychiatric/Behavioral: The patient is not nervous/anxious.     Physical Exam Constitutional:      General: He is not in acute distress.    Appearance: He is well-developed. He is obese. He is not diaphoretic.  Eyes:     Comments: History of bilateral cataract removal    Neck:     Musculoskeletal: Neck supple.     Thyroid: No thyromegaly.  Cardiovascular:     Rate and Rhythm: Normal rate and regular rhythm.     Pulses: Normal pulses.     Heart sounds: Normal heart sounds.  Pulmonary:     Effort: Pulmonary effort is normal. No  respiratory distress.     Breath sounds: Normal breath sounds.  Abdominal:     General: Bowel sounds are normal. There is no distension.     Palpations: Abdomen is soft.     Tenderness: There is no abdominal tenderness.  Musculoskeletal:     Right lower leg: Edema present.     Left lower leg: Edema  present.     Comments: Chronic 1- 2+ bilateral lower extremity edema Is able to move all extremities  History of right total knee replacement and neck fusion    Is unable to stand at this time    Lymphadenopathy:     Cervical: No cervical adenopathy.  Skin:    General: Skin is warm and dry.     Comments: Bilateral lower extremities discolored    Neurological:     Mental Status: He is alert. Mental status is at baseline.  Psychiatric:        Mood and Affect: Mood normal.      ASSESSMENT/ PLAN:  TODAY  1. Acute renal failure superimposed on stage 3 chronic kidney disease unspecified acute renal failure type  2. Physical deconditioning 3. Major depression recurrent chronic  Will begin him on NS at 100 cc per hour for 2 liters.  Will begin lidoderm patch 1/2 on each knee daily  Will continue to monitor his status.      MD is aware of resident's narcotic use and is in agreement with current plan of care. We will attempt to wean resident as appropriate.  Ok Edwards NP [facility]  Contact [phone] Monday through Friday 8am- 5pm  After hours call [phone]

## 2019-08-10 ENCOUNTER — Non-Acute Institutional Stay (SKILLED_NURSING_FACILITY): Payer: Medicare HMO | Admitting: Adult Health

## 2019-08-10 ENCOUNTER — Encounter: Payer: Self-pay | Admitting: Adult Health

## 2019-08-10 DIAGNOSIS — G301 Alzheimer's disease with late onset: Secondary | ICD-10-CM | POA: Diagnosis not present

## 2019-08-10 DIAGNOSIS — R627 Adult failure to thrive: Secondary | ICD-10-CM | POA: Diagnosis not present

## 2019-08-10 DIAGNOSIS — F0280 Dementia in other diseases classified elsewhere without behavioral disturbance: Secondary | ICD-10-CM

## 2019-08-10 NOTE — Progress Notes (Signed)
Location:    [facility] Room Number: 129/P Place of Service:  SNF (31)   CODE STATUS: Full Code  Allergies  Allergen Reactions  . Zosyn [Piperacillin Sod-Tazobactam So] Anaphylaxis, Swelling and Other (See Comments)    Facial swelling Has patient had a PCN reaction causing immediate rash, facial/tongue/throat swelling, SOB or lightheadedness with hypotension: Yes Has patient had a PCN reaction causing severe rash involving mucus membranes or skin necrosis: No Has patient had a PCN reaction that required hospitalization: No Has patient had a PCN reaction occurring within the last 10 years: Yes If all of the above answers are "NO", then may proceed with Cephalosporin use.   . Ace Inhibitors Cough  . Vancomycin Swelling    Facial swelling    Chief Complaint  Patient presents with  . Acute Visit    Care Plan Meeting,     HPI:  We have come together for his care plan meeting. His family is present. His sons are not able to provide the care he requires at home at his present condition. We have discussed options including staying at [facility]; home with hospice care. He continues to have a poor po intake; he is unable to fully participate in therapy. He is not getting better; more than likely he is at his best. Over time he will continue to fail. His family has verbalized understanding.    Past Medical History:  Diagnosis Date  . Acute cholecystitis 11/2012   Drained percutaneously  . Anxiety   . BPH (benign prostatic hypertrophy)   . Deep venous thrombosis (HCC)    Left leg, diagnosed 7/12  . Dementia [facility])    Needs supervision for safety per son - POA  . Diverticulitis   . Essential hypertension   . GERD (gastroesophageal reflux disease)   . History of pneumonia    Prior to 2012  . Incontinence of urine   . Mixed hyperlipidemia   . Osteoarthritis   . Pulmonary embolism ([place])    Diagnosed 7/12  . Type 2 diabetes mellitus ([place])     Past Surgical  History:  Procedure Laterality Date  . CARPAL TUNNEL RELEASE    . COLONOSCOPY  08/31/2012   Procedure: COLONOSCOPY;  Surgeon: Rogene Houston, MD;  [facility];  Service: Endoscopy;  Laterality: N/A;  830  . CYSTOSCOPY WITH INJECTION N/A 05/15/2018   Procedure: CYSTOSCOPY WITH BOTOX INJECTION;  Surgeon: Cleon Gustin, MD;  Location: [facility] ORS;  Service: Urology;  Laterality: N/A;  . CYSTOSCOPY WITH INJECTION N/A 11/10/2018   Procedure: CYSTOSCOPY WITH BOTOX  INJECTION;  Surgeon: Cleon Gustin, MD;  Location: [facility] ORS;  Service: Urology;  Laterality: N/A;  30 MINS  . CYSTOSCOPY WITH INSERTION OF UROLIFT N/A 09/02/2016   Procedure: CYSTOSCOPY WITH INSERTION OF UROLIFT;  Surgeon: Cleon Gustin, MD;  Location: [facility];  Service: Urology;  Laterality: N/A;  . EYE SURGERY Bilateral    bilateral cataract  . Right total knee replacement  04/23/2011  . SPINE SURGERY  prior to 2012   Neck fusion  . UMBILICAL HERNIA REPAIR     umbilical    Social History   Socioeconomic History  . Marital status: Widowed    Spouse name: Not on file  . Number of children: Not on file  . Years of education: Not on file  . Highest education level: Not on file  Occupational History  . Not on file  Social Needs  .  Financial resource strain: Not on file  . Food insecurity    Worry: Not on file    Inability: Not on file  . Transportation needs    Medical: Not on file    Non-medical: Not on file  Tobacco Use  . Smoking status: Never Smoker  . Smokeless tobacco: Never Used  Substance and Sexual Activity  . Alcohol use: No  . Drug use: No  . Sexual activity: Never  Lifestyle  . Physical activity    Days per week: Not on file    Minutes per session: Not on file  . Stress: Not on file  Relationships  . Social Herbalist on phone: Not on file    Gets together: Not on file    Attends religious service: Not on file    Active member of club or organization:  Not on file    Attends meetings of clubs or organizations: Not on file    Relationship status: Not on file  . Intimate partner violence    Fear of current or ex partner: Not on file    Emotionally abused: Not on file    Physically abused: Not on file    Forced sexual activity: Not on file  Other Topics Concern  . Not on file  Social History Narrative  . Not on file   Family History  Problem Relation Age of Onset  . Cancer Mother        Stomach  . Diabetes Sister   . Hypertension Sister   . Diabetes Brother   . Cancer Brother        Gallbladder  . Seizures Son        due to meningitis as a child      VITAL SIGNS BP 132/69   Pulse 70   Temp 98.7 F (37.1 C) (Oral)   Resp 20   Ht 5\' 10"  (1.778 m)   Wt 232 lb 6.4 oz (105.4 kg)   SpO2 95%   BMI 33.35 kg/m   Facility-Administered Encounter Medications as of 08/10/2019  Medication  . botulinum toxin Type A (BOTOX) injection 100 Units   Outpatient Encounter Medications as of 08/10/2019  Medication Sig  . acetaminophen (TYLENOL) 500 MG tablet Take 1,000 mg by mouth every 6 (six) hours as needed for mild pain, moderate pain or headache.   [name] atenolol (TENORMIN) 25 MG tablet Take 25 mg by mouth daily.  [name] donepezil (ARICEPT) 10 MG tablet TAKE 1 TABLET AT BEDTIME  . Ferrous Sulfate (IRON) 325 (65 Fe) MG TABS Take 325 mg by mouth daily.   . fluconazole (DIFLUCAN) 50 MG tablet Take 50 mg by mouth daily.  . Glucerna (GLUCERNA) LIQD Take 237 mLs by mouth daily. Due to poor meal intake  . insulin glargine (LANTUS) 100 UNIT/ML injection Inject 0.05 mLs (5 Units total) into the skin every morning.  . Lidocaine 4 % PTCH Instructions: apply 1/2 patch to both knees in the AM and remove at HS for bilateral knee arthritic pain Once A Day  . linagliptin (TRADJENTA) 5 MG TABS tablet Take 1 tablet (5 mg total) by mouth daily.  [name] loratadine (CLARITIN) 10 MG tablet Take 10 mg by mouth every morning.   . lovastatin (MEVACOR) 40 MG tablet TAKE  1 TABLET AT BEDTIME  . magnesium hydroxide (MILK OF MAGNESIA) 400 MG/5ML suspension Take 30 mLs by mouth daily as needed for mild constipation. For constipation  . NON FORMULARY Diet Type Change: Dysphagia 2,  thin liquids (moisten meats with gravy) will allow banana cut into pieces upon request  . omeprazole (PRILOSEC) 40 MG capsule TAKE 1 CAPSULE TWICE DAILY  . OXYGEN Inhale 2 L/min into the lungs as needed. May give oxygen at 2 L/min via nasal cannula, titrating to keep O2 saturation over 90%.  Inform physician ASAP for acute onset dyspnea or O2 saturation < 88%  . sertraline (ZOLOFT) 50 MG tablet Take 1 tablet (50 mg total) by mouth daily.  . tamsulosin (FLOMAX) 0.4 MG CAPS capsule TAKE 1 CAPSULE EVERY DAY  . Trospium Chloride 60 MG CP24 Take 60 mg by mouth daily.   . Vitamin D, Ergocalciferol, (DRISDOL) 1.25 MG (50000 UT) CAPS capsule Take 1 capsule (50,000 Units total) by mouth every 7 (seven) days.     SIGNIFICANT DIAGNOSTIC EXAMS    PREVIOUS;   07-08-19: ct of head an cervical spine:  1. No evidence of significant acute traumatic injury to the skull, brain or cervical spine. 2. Mild cerebral atrophy with extensive chronic microvascular ischemic changes in the cerebral white matter, as above. 3. Status post ACDF at C4-C6 with multilevel degenerative disc disease and cervical spondylosis, as above  07-08-19: chest x-ray:  1. No signs of significant acute traumatic injury to the thorax. 2. The appearance the chest is unusual. The possibility of congestive heart failure is considered, however, clinical correlation for signs and symptoms of severe acute bronchitis is recommended. 3. Aortic atherosclerosis.   07-08-19: bilateral hip x-ray: No acute osseous abnormality.   07-08-19; lumbar spine x-ray: Spondylosis, without acute osseous abnormality. Aortic Atherosclerosis   07-11-19: chest x-ray: Cardiomegaly with mild vascular congestion. No focal consolidation.   TODAY;   08-08-19: chest  x-ray: No active cardiopulmonary disease.  LABS REVIEWED PREVIOUS;   04-24-19: hgb a1c 10.0 chol 130; ldl 76; tirg 115; hdl 34; vit D 51 07-08-19: wbc 5.8; hgb 11.3; hct 35.8; mcv 100.0; plt 192; glucose 123; bun 15; creat 1.58; k+ 5.1; na++ 140; ca 8.6 liver normal albumin 3.2; urine culture: no growth 07-10-19: wbc 5.7; hgb 12.6; hct 40.2; mcv 98.8 plt 194; glucose 146; bun 16; creat 1.41; k+ 4.3; na++ 140 ca 8.7 07-11-19: tsh 0.400 ammonia 14 07/06/2019: wbc 4.6; hgb 10.8; hct 34.3; mvc 100.6 plt 191; glucose 137; bun 17; creat 1.52; k+ 4.1; na+ 139; ca 8.1  08-08-19: wbc 7.3; hgb 10.4; hct 33.1; mcv 100.3; plt 227; glucose 292; bun 43; creat 2.27; k+ 5.2; na++ 134; ca 8.4   NO NEW LABS   Review of Systems  Constitutional: Negative for malaise/fatigue.  Respiratory: Negative for cough.   Cardiovascular: Negative for chest pain and leg swelling.  Gastrointestinal: Negative for heartburn.  Musculoskeletal: Negative for back pain, joint pain and myalgias.  Skin: Negative.   Neurological: Negative for dizziness.  Psychiatric/Behavioral: The patient is not nervous/anxious.      Physical Exam Constitutional:      General: He is not in acute distress.    Appearance: He is well-developed. He is not diaphoretic.  Eyes:     Comments: History of bilateral cataract removal     Neck:     Thyroid: No thyromegaly.  Cardiovascular:     Rate and Rhythm: Normal rate and regular rhythm.     Heart sounds: Normal heart sounds.  Pulmonary:     Effort: Pulmonary effort is normal. No respiratory distress.     Breath sounds: Normal breath sounds.  Abdominal:     General: Bowel sounds are normal. There is  no distension.     Palpations: Abdomen is soft.     Tenderness: There is no abdominal tenderness.  Musculoskeletal:     Right lower leg: Edema present.     Left lower leg: Edema present.     Comments: Chronic 1- 2+ bilateral lower extremity edema Is able to move all extremities  History of right  total knee replacement and neck fusion    Is unable to stand at this time     Lymphadenopathy:     Cervical: No cervical adenopathy.  Skin:    General: Skin is warm and dry.  Neurological:     Mental Status: He is alert. Mental status is at baseline.  Psychiatric:        Mood and Affect: Mood normal.      ASSESSMENT/ PLAN:  TODAY   1. Failure to thrive in adult 2. Late onset alzheimer's disease without behavioral disturbance  Will continue current medications  Will continue therapy as directed Will continue to monitor his status Family will consider options and will let facility know their plan.   MD is aware of resident's narcotic use and is in agreement with current plan of care. We will attempt to wean resident as appropriate.  Ok Edwards NP [facility] Adult Medicine  Contact [phone] Monday through Friday 8am- 5pm  After hours call [phone]

## 2019-08-11 DIAGNOSIS — N183 Chronic kidney disease, stage 3 unspecified: Secondary | ICD-10-CM | POA: Insufficient documentation

## 2019-08-11 DIAGNOSIS — N179 Acute kidney failure, unspecified: Secondary | ICD-10-CM | POA: Insufficient documentation

## 2019-08-13 ENCOUNTER — Ambulatory Visit: Payer: Medicare HMO | Admitting: Family Medicine

## 2019-08-14 ENCOUNTER — Encounter: Payer: Self-pay | Admitting: Adult Health

## 2019-08-14 ENCOUNTER — Non-Acute Institutional Stay (SKILLED_NURSING_FACILITY): Payer: Medicare HMO | Admitting: Adult Health

## 2019-08-14 DIAGNOSIS — G301 Alzheimer's disease with late onset: Secondary | ICD-10-CM

## 2019-08-14 DIAGNOSIS — I4811 Longstanding persistent atrial fibrillation: Secondary | ICD-10-CM

## 2019-08-14 DIAGNOSIS — F0280 Dementia in other diseases classified elsewhere without behavioral disturbance: Secondary | ICD-10-CM

## 2019-08-14 DIAGNOSIS — R627 Adult failure to thrive: Secondary | ICD-10-CM | POA: Diagnosis not present

## 2019-08-14 NOTE — Progress Notes (Signed)
Location:    [place] Room Number: 129/P Place of Service:  SNF (31)   CODE STATUS: DNR  Allergies  Allergen Reactions  . Zosyn [Piperacillin Sod-Tazobactam So] Anaphylaxis, Swelling and Other (See Comments)    Facial swelling Has patient had a PCN reaction causing immediate rash, facial/tongue/throat swelling, SOB or lightheadedness with hypotension: Yes Has patient had a PCN reaction causing severe rash involving mucus membranes or skin necrosis: No Has patient had a PCN reaction that required hospitalization: No Has patient had a PCN reaction occurring within the last 10 years: Yes If all of the above answers are "NO", then may proceed with Cephalosporin use.   . Ace Inhibitors Cough  . Vancomycin Swelling    Facial swelling    Chief Complaint  Patient presents with  . Acute Visit    Care Plan Meeting     HPI:  We have come together for a care plan meeting to discuss his overall status and goals of care. His family is present (we are on the patio). They cannot provide his care at home. He requires total care for all of his adls. We have discussed hospice as an option of care home; however they do not provide 24 hour care. His son's home cannot accommodate a hospital bed.  They desire for comfort care. We have discussed his advanced directives: they do not want cpr. We have discussed the pros and cons of a tube feeding; they have decided for no tube feeding. They do not want hospitalization. Would like for abt if needed and do want 1 liter of IVF if needed.    Past Medical History:  Diagnosis Date  . Acute cholecystitis 11/2012   Drained percutaneously  . Anxiety   . BPH (benign prostatic hypertrophy)   . Deep venous thrombosis (HCC)    Left leg, diagnosed 7/12  . Dementia [facility])    Needs supervision for safety per son - POA  . Diverticulitis   . Essential hypertension   . GERD (gastroesophageal reflux disease)   . History of pneumonia    Prior to 2012  . Incontinence of urine   . Mixed hyperlipidemia   . Osteoarthritis   . Pulmonary embolism ([place])    Diagnosed 7/12  . Type 2 diabetes mellitus ([place])     Past Surgical History:  Procedure Laterality Date  . CARPAL TUNNEL RELEASE    . COLONOSCOPY  08/31/2012   Procedure: COLONOSCOPY;  Surgeon: Rogene Houston, MD;  [facility];  Service: Endoscopy;  Laterality: N/A;  830  . CYSTOSCOPY WITH INJECTION N/A 05/15/2018   Procedure: CYSTOSCOPY WITH BOTOX INJECTION;  Surgeon: Cleon Gustin, MD;  Location: [facility] OR[facility];  Service: Urology;  Laterality: N/A;  . CYSTOSCOPY WITH INJECTION N/A 11/10/2018   Procedure: CYSTOSCOPY WITH BOTOX  INJECTION;  Surgeon: Cleon Gustin, MD;  Location: [facility];  Service: Urology;  Laterality: N/A;  30 MINS  . CYSTOSCOPY WITH INSERTION OF UROLIFT N/A 09/02/2016   Procedure: CYSTOSCOPY WITH INSERTION OF UROLIFT;  Surgeon: Cleon Gustin, MD;  Location: [facility];  Service: Urology;  Laterality: N/A;  . EYE SURGERY Bilateral    bilateral cataract  . Right total knee replacement  04/23/2011  . SPINE SURGERY  prior to 2012   Neck fusion  . UMBILICAL HERNIA REPAIR     umbilical    Social History   Socioeconomic History  . Marital status: Widowed    Spouse name: Not  on file  . Number of children: Not on file  . Years of education: Not on file  . Highest education level: Not on file  Occupational History  . Not on file  Social Needs  . Financial resource strain: Not on file  . Food insecurity    Worry: Not on file    Inability: Not on file  . Transportation needs    Medical: Not on file    Non-medical: Not on file  Tobacco Use  . Smoking status: Never Smoker  . Smokeless tobacco: Never Used  Substance and Sexual Activity  . Alcohol use: No  . Drug use: No  . Sexual activity: Never  Lifestyle  . Physical activity    Days per week: Not on file    Minutes per session: Not on file  . Stress: Not on  file  Relationships  . Social Herbalist on phone: Not on file    Gets together: Not on file    Attends religious service: Not on file    Active member of club or organization: Not on file    Attends meetings of clubs or organizations: Not on file    Relationship status: Not on file  . Intimate partner violence    Fear of current or ex partner: Not on file    Emotionally abused: Not on file    Physically abused: Not on file    Forced sexual activity: Not on file  Other Topics Concern  . Not on file  Social History Narrative  . Not on file   Family History  Problem Relation Age of Onset  . Cancer Mother        Stomach  . Diabetes Sister   . Hypertension Sister   . Diabetes Brother   . Cancer Brother        Gallbladder  . Seizures Son        due to meningitis as a child      VITAL SIGNS BP 131/77   Pulse 63   Temp 98.7 F (37.1 C) (Oral)   Resp 20   Ht 5\' 10"  (1.778 m)   Wt 232 lb 6.4 oz (105.4 kg)   SpO2 95%   BMI 33.35 kg/m   Facility-Administered Encounter Medications as of 08/14/2019  Medication  . botulinum toxin Type A (BOTOX) injection 100 Units   Outpatient Encounter Medications as of 08/14/2019  Medication Sig  . acetaminophen (TYLENOL) 500 MG tablet Take 1,000 mg by mouth every 6 (six) hours as needed for mild pain, moderate pain or headache.   [name] atenolol (TENORMIN) 25 MG tablet Take 25 mg by mouth daily.  [name] donepezil (ARICEPT) 10 MG tablet TAKE 1 TABLET AT BEDTIME  . fluconazole (DIFLUCAN) 50 MG tablet Take 50 mg by mouth daily.  . Glucerna (GLUCERNA) LIQD Take 237 mLs by mouth daily. Due to poor meal intake  . insulin glargine (LANTUS) 100 UNIT/ML injection Inject 0.05 mLs (5 Units total) into the skin every morning.  . Lidocaine 4 % PTCH Instructions: apply 1/2 patch to both knees in the AM and remove at HS for bilateral knee arthritic pain Once A Day  . linagliptin (TRADJENTA) 5 MG TABS tablet Take 5 mg by mouth daily.  [name] loratadine  (CLARITIN) 10 MG tablet Take 10 mg by mouth every morning.   . magnesium hydroxide (MILK OF MAGNESIA) 400 MG/5ML suspension Take 30 mLs by mouth daily as needed for mild constipation. For constipation  .  NON FORMULARY Diet Type Change: Dysphagia 2, thin liquids (moisten meats with gravy) will allow banana cut into pieces upon request  . omeprazole (PRILOSEC) 40 MG capsule TAKE 1 CAPSULE TWICE DAILY  . OXYGEN Inhale 2 L/min into the lungs as needed. May give oxygen at 2 L/min via nasal cannula, titrating to keep O2 saturation over 90%.  Inform physician ASAP for acute onset dyspnea or O2 saturation < 88%  . sertraline (ZOLOFT) 50 MG tablet Take 1 tablet (50 mg total) by mouth daily.  . tamsulosin (FLOMAX) 0.4 MG CAPS capsule TAKE 1 CAPSULE EVERY DAY  . [DISCONTINUED] Ferrous Sulfate (IRON) 325 (65 Fe) MG TABS Take 325 mg by mouth daily.   . [DISCONTINUED] linagliptin (TRADJENTA) 5 MG TABS tablet Take 1 tablet (5 mg total) by mouth daily.  . [DISCONTINUED] lovastatin (MEVACOR) 40 MG tablet TAKE 1 TABLET AT BEDTIME  . [DISCONTINUED] Trospium Chloride 60 MG CP24 Take 60 mg by mouth daily.   . [DISCONTINUED] Vitamin D, Ergocalciferol, (DRISDOL) 1.25 MG (50000 UT) CAPS capsule Take 1 capsule (50,000 Units total) by mouth every 7 (seven) days.     SIGNIFICANT DIAGNOSTIC EXAMS  PREVIOUS;   07-08-19: ct of head an cervical spine:  1. No evidence of significant acute traumatic injury to the skull, brain or cervical spine. 2. Mild cerebral atrophy with extensive chronic microvascular ischemic changes in the cerebral white matter, as above. 3. Status post ACDF at C4-C6 with multilevel degenerative disc disease and cervical spondylosis, as above  07-08-19: chest x-ray:  1. No signs of significant acute traumatic injury to the thorax. 2. The appearance the chest is unusual. The possibility of congestive heart failure is considered, however, clinical correlation for signs and symptoms of severe acute  bronchitis is recommended. 3. Aortic atherosclerosis.   07-08-19: bilateral hip x-ray: No acute osseous abnormality.   07-08-19; lumbar spine x-ray: Spondylosis, without acute osseous abnormality. Aortic Atherosclerosis   07-11-19: chest x-ray: Cardiomegaly with mild vascular congestion. No focal consolidation.    08-08-19: chest x-ray: No active cardiopulmonary disease.  NO NEW EXAMS.   LABS REVIEWED PREVIOUS;   04-24-19: hgb a1c 10.0 chol 130; ldl 76; tirg 115; hdl 34; vit D 51 07-08-19: wbc 5.8; hgb 11.3; hct 35.8; mcv 100.0; plt 192; glucose 123; bun 15; creat 1.58; k+ 5.1; na++ 140; ca 8.6 liver normal albumin 3.2; urine culture: no growth 07-10-19: wbc 5.7; hgb 12.6; hct 40.2; mcv 98.8 plt 194; glucose 146; bun 16; creat 1.41; k+ 4.3; na++ 140 ca 8.7 07-11-19: tsh 0.400 ammonia 14 07/11/2019: wbc 4.6; hgb 10.8; hct 34.3; mvc 100.6 plt 191; glucose 137; bun 17; creat 1.52; k+ 4.1; na+ 139; ca 8.1  08-08-19: wbc 7.3; hgb 10.4; hct 33.1; mcv 100.3; plt 227; glucose 292; bun 43; creat 2.27; k+ 5.2; na++ 134; ca 8.4   NO NEW LABS   Review of Systems  Constitutional: Negative for malaise/fatigue.  Respiratory: Negative for cough.   Cardiovascular: Negative for chest pain.  Gastrointestinal: Negative for constipation and heartburn.  Musculoskeletal: Negative for myalgias.  Skin: Negative.   Neurological: Negative for dizziness.  Psychiatric/Behavioral: The patient is not nervous/anxious.     Physical Exam Constitutional:      General: He is not in acute distress.    Appearance: He is well-developed. He is not diaphoretic.  Eyes:     Comments: History of bilateral cataract removal    Neck:     Musculoskeletal: Neck supple.     Thyroid: No thyromegaly.  Cardiovascular:     Rate and Rhythm: Normal rate and regular rhythm.     Pulses: Normal pulses.     Heart sounds: Normal heart sounds.  Pulmonary:     Effort: Pulmonary effort is normal. No respiratory distress.     Breath sounds:  Normal breath sounds.  Abdominal:     General: Bowel sounds are normal. There is no distension.     Palpations: Abdomen is soft.     Tenderness: There is no abdominal tenderness.  Musculoskeletal:     Right lower leg: Edema present.     Left lower leg: Edema present.     Comments: Chronic 1- 2+ bilateral lower extremity edema Is able to move all extremities  History of right total knee replacement and neck fusion    Is unable to stand at this time      Lymphadenopathy:     Cervical: No cervical adenopathy.  Skin:    General: Skin is warm and dry.  Neurological:     Mental Status: He is alert. Mental status is at baseline.  Psychiatric:        Mood and Affect: Mood normal.      ASSESSMENT/ PLAN:  TODAY  1. Late onset alzheimer's disease without behavioral disturbance 2. Failure to thrive in adult 3. Long standing persistent atrial fibrillation.   Will stop the following medications: vit d; statin; santura iron  His MOST form has been filled out: comfort care; no tube feeding; ok for abt and 1 liter of fluid.   Time spent with patient and family 60 minutes: (50 minutes spent with advanced directives)    MD is aware of resident's narcotic use and is in agreement with current plan of care. We will attempt to wean resident as appropriate.  Ok Edwards NP [facility] Adult Medicine  Contact [phone] Monday through Friday 8am- 5pm  After hours call [phone]

## 2019-08-15 DIAGNOSIS — R627 Adult failure to thrive: Secondary | ICD-10-CM | POA: Insufficient documentation

## 2019-08-17 ENCOUNTER — Non-Acute Institutional Stay (SKILLED_NURSING_FACILITY): Payer: Medicare HMO | Admitting: Adult Health

## 2019-08-17 ENCOUNTER — Encounter: Payer: Self-pay | Admitting: Adult Health

## 2019-08-17 DIAGNOSIS — IMO0002 Reserved for concepts with insufficient information to code with codable children: Secondary | ICD-10-CM

## 2019-08-17 DIAGNOSIS — E1165 Type 2 diabetes mellitus with hyperglycemia: Secondary | ICD-10-CM

## 2019-08-17 DIAGNOSIS — E1121 Type 2 diabetes mellitus with diabetic nephropathy: Secondary | ICD-10-CM | POA: Diagnosis not present

## 2019-08-17 DIAGNOSIS — Z794 Long term (current) use of insulin: Secondary | ICD-10-CM | POA: Diagnosis not present

## 2019-08-17 NOTE — Progress Notes (Signed)
Location:    [place] Room Number: 129/P Place of Service:  SNF (31)   CODE STATUS: DNR  Allergies  Allergen Reactions  . Zosyn [Piperacillin Sod-Tazobactam So] Anaphylaxis, Swelling and Other (See Comments)    Facial swelling Has patient had a PCN reaction causing immediate rash, facial/tongue/throat swelling, SOB or lightheadedness with hypotension: Yes Has patient had a PCN reaction causing severe rash involving mucus membranes or skin necrosis: No Has patient had a PCN reaction that required hospitalization: No Has patient had a PCN reaction occurring within the last 10 years: Yes If all of the above answers are "NO", then may proceed with Cephalosporin use.   . Ace Inhibitors Cough  . Vancomycin Swelling    Facial swelling    Chief Complaint  Patient presents with  . Acute Visit    Diabetes,     HPI:  His cbg readings are elevated. The focus of his care is for comfort. There are no reports of excessive hunger or thirst. He continues to have poor po intake.   Past Medical History:  Diagnosis Date  . Acute cholecystitis 11/2012   Drained percutaneously  . Anxiety   . BPH (benign prostatic hypertrophy)   . Deep venous thrombosis (HCC)    Left leg, diagnosed 7/12  . Dementia [facility])    Needs supervision for safety per son - POA  . Diverticulitis   . Essential hypertension   . GERD (gastroesophageal reflux disease)   . History of pneumonia    Prior to 2012  . Incontinence of urine   . Mixed hyperlipidemia   . Osteoarthritis   . Pulmonary embolism ([place])    Diagnosed 7/12  . Type 2 diabetes mellitus ([place])     Past Surgical History:  Procedure Laterality Date  . CARPAL TUNNEL RELEASE    . COLONOSCOPY  08/31/2012   Procedure: COLONOSCOPY;  Surgeon: Rogene Houston, MD;  [facility];  Service: Endoscopy;  Laterality: N/A;  830  . CYSTOSCOPY WITH INJECTION N/A 05/15/2018   Procedure: CYSTOSCOPY WITH BOTOX INJECTION;  Surgeon:  Cleon Gustin, MD;  Location: [facility] ORS;  Service: Urology;  Laterality: N/A;  . CYSTOSCOPY WITH INJECTION N/A 11/10/2018   Procedure: CYSTOSCOPY WITH BOTOX  INJECTION;  Surgeon: Cleon Gustin, MD;  Location: [facility] ORS;  Service: Urology;  Laterality: N/A;  30 MINS  . CYSTOSCOPY WITH INSERTION OF UROLIFT N/A 09/02/2016   Procedure: CYSTOSCOPY WITH INSERTION OF UROLIFT;  Surgeon: Cleon Gustin, MD;  Location: [facility];  Service: Urology;  Laterality: N/A;  . EYE SURGERY Bilateral    bilateral cataract  . Right total knee replacement  04/23/2011  . SPINE SURGERY  prior to 2012   Neck fusion  . UMBILICAL HERNIA REPAIR     umbilical    Social History   Socioeconomic History  . Marital status: Widowed    Spouse name: Not on file  . Number of children: Not on file  . Years of education: Not on file  . Highest education level: Not on file  Occupational History  . Not on file  Social Needs  . Financial resource strain: Not on file  . Food insecurity    Worry: Not on file    Inability: Not on file  . Transportation needs    Medical: Not on file    Non-medical: Not on file  Tobacco Use  . Smoking status: Never Smoker  . Smokeless tobacco: Never Used  Substance and Sexual Activity  . Alcohol use: No  . Drug use: No  . Sexual activity: Never  Lifestyle  . Physical activity    Days per week: Not on file    Minutes per session: Not on file  . Stress: Not on file  Relationships  . Social Herbalist on phone: Not on file    Gets together: Not on file    Attends religious service: Not on file    Active member of club or organization: Not on file    Attends meetings of clubs or organizations: Not on file    Relationship status: Not on file  . Intimate partner violence    Fear of current or ex partner: Not on file    Emotionally abused: Not on file    Physically abused: Not on file    Forced sexual activity: Not on file  Other Topics Concern   . Not on file  Social History Narrative  . Not on file   Family History  Problem Relation Age of Onset  . Cancer Mother        Stomach  . Diabetes Sister   . Hypertension Sister   . Diabetes Brother   . Cancer Brother        Gallbladder  . Seizures Son        due to meningitis as a child      VITAL SIGNS BP (!) 142/94   Pulse 100   Temp 97.9 F (36.6 C) (Oral)   Resp 20   Ht 5\' 10"  (1.778 m)   Wt 232 lb 6.4 oz (105.4 kg)   SpO2 95%   BMI 33.35 kg/m   Facility-Administered Encounter Medications as of 08/17/2019  Medication  . botulinum toxin Type A (BOTOX) injection 100 Units   Outpatient Encounter Medications as of 08/17/2019  Medication Sig  . acetaminophen (TYLENOL) 500 MG tablet Take 1,000 mg by mouth every 6 (six) hours as needed for mild pain, moderate pain or headache.   [name] atenolol (TENORMIN) 25 MG tablet Take 25 mg by mouth daily.  [name] donepezil (ARICEPT) 10 MG tablet TAKE 1 TABLET AT BEDTIME  . Glucerna (GLUCERNA) LIQD Take 237 mLs by mouth daily. Due to poor meal intake  . Insulin Glargine (LANTUS SOLOSTAR) 100 UNIT/ML Solostar Pen Inject 8 Units into the skin every morning. 8:00 am  . Lidocaine 4 % PT[facility] Instructions: apply 1/2 patch to both knees in the AM and remove at HS for bilateral knee arthritic pain Once A Day  . linagliptin (TRADJENTA) 5 MG TABS tablet Take 5 mg by mouth daily.  [name] loratadine (CLARITIN) 10 MG tablet Take 10 mg by mouth every morning.   . magnesium hydroxide (MILK OF MAGNESIA) 400 MG/5ML suspension Take 30 mLs by mouth daily as needed for mild constipation. For constipation  . NON FORMULARY Diet Type Change: Dysphagia 2, thin liquids (moisten meats with gravy) will allow banana cut into pieces upon request  . omeprazole (PRILOSEC) 40 MG capsule TAKE 1 CAPSULE TWICE DAILY  . OXYGEN Inhale 2 L/min into the lungs as needed. May give oxygen at 2 L/min via nasal cannula, titrating to keep O2 saturation over 90%.  Inform physician ASAP for  acute onset dyspnea or O2 saturation < 88%  . sertraline (ZOLOFT) 50 MG tablet Take 1 tablet (50 mg total) by mouth daily.  . tamsulosin (FLOMAX) 0.4 MG CAPS capsule TAKE 1 CAPSULE EVERY DAY  . [DISCONTINUED] insulin glargine (LANTUS)  100 UNIT/ML injection Inject 0.05 mLs (5 Units total) into the skin every morning. (Patient taking differently: Inject 8 Units into the skin every morning. )     SIGNIFICANT DIAGNOSTIC EXAMS   PREVIOUS;   07-08-19: ct of head an cervical spine:  1. No evidence of significant acute traumatic injury to the skull, brain or cervical spine. 2. Mild cerebral atrophy with extensive chronic microvascular ischemic changes in the cerebral white matter, as above. 3. Status post ACDF at C4-C6 with multilevel degenerative disc disease and cervical spondylosis, as above  07-08-19: chest x-ray:  1. No signs of significant acute traumatic injury to the thorax. 2. The appearance the chest is unusual. The possibility of congestive heart failure is considered, however, clinical correlation for signs and symptoms of severe acute bronchitis is recommended. 3. Aortic atherosclerosis.   07-08-19: bilateral hip x-ray: No acute osseous abnormality.   07-08-19; lumbar spine x-ray: Spondylosis, without acute osseous abnormality. Aortic Atherosclerosis   07-11-19: chest x-ray: Cardiomegaly with mild vascular congestion. No focal consolidation.    08-08-19: chest x-ray: No active cardiopulmonary disease.  NO NEW EXAMS.   LABS REVIEWED PREVIOUS;   04-24-19: hgb a1c 10.0 chol 130; ldl 76; tirg 115; hdl 34; vit D 51 07-08-19: wbc 5.8; hgb 11.3; hct 35.8; mcv 100.0; plt 192; glucose 123; bun 15; creat 1.58; k+ 5.1; na++ 140; ca 8.6 liver normal albumin 3.2; urine culture: no growth 07-10-19: wbc 5.7; hgb 12.6; hct 40.2; mcv 98.8 plt 194; glucose 146; bun 16; creat 1.41; k+ 4.3; na++ 140 ca 8.7 07-11-19: tsh 0.400 ammonia 14 07/01/2019: wbc 4.6; hgb 10.8; hct 34.3; mvc 100.6 plt 191; glucose 137; bun  17; creat 1.52; k+ 4.1; na+ 139; ca 8.1  08-08-19: wbc 7.3; hgb 10.4; hct 33.1; mcv 100.3; plt 227; glucose 292; bun 43; creat 2.27; k+ 5.2; na++ 134; ca 8.4   NO NEW LABS  Review of Systems  Constitutional: Negative for malaise/fatigue.  Respiratory: Negative for cough.   Cardiovascular: Negative for chest pain.  Gastrointestinal: Negative for abdominal pain.  Musculoskeletal: Negative for back pain.  Skin: Negative.   Neurological: Negative for dizziness.  Psychiatric/Behavioral: The patient is not nervous/anxious.     Physical Exam Constitutional:      General: He is not in acute distress.    Appearance: He is well-developed. He is not diaphoretic.  Eyes:     Comments:  History of bilateral cataract removal     Neck:     Musculoskeletal: Neck supple.     Thyroid: No thyromegaly.  Cardiovascular:     Rate and Rhythm: Normal rate and regular rhythm.     Pulses: Normal pulses.     Heart sounds: Normal heart sounds.  Pulmonary:     Effort: Pulmonary effort is normal. No respiratory distress.     Breath sounds: Normal breath sounds.  Abdominal:     General: Bowel sounds are normal. There is no distension.     Palpations: Abdomen is soft.     Tenderness: There is no abdominal tenderness.  Musculoskeletal:     Right lower leg: Edema present.     Left lower leg: Edema present.     Comments: Chronic 1- 2+ bilateral lower extremity edema Is able to move all extremities  History of right total knee replacement and neck fusion    Is unable to stand at this time    Lymphadenopathy:     Cervical: No cervical adenopathy.  Skin:    General: Skin is  warm and dry.  Neurological:     Mental Status: He is alert. Mental status is at baseline.  Psychiatric:        Mood and Affect: Mood normal.      ASSESSMENT/ PLAN:  TODAY;   1. Uncontrolled diabetes mellitus with diabetic nephropathy with long term current use of insulin : hgb a1c 10.0; cbg readings are elevated will continue  tradjenta 5 mg daily and will increase lantus to 8 units nightly      MD is aware of resident's narcotic use and is in agreement with current plan of care. We will attempt to wean resident as appropriate.  Ok Edwards NP [facility]  Contact [phone] Monday through Friday 8am- 5pm  After hours call [phone]

## 2019-08-22 ENCOUNTER — Non-Acute Institutional Stay (SKILLED_NURSING_FACILITY): Payer: Medicare HMO | Admitting: Adult Health

## 2019-08-22 ENCOUNTER — Encounter: Payer: Self-pay | Admitting: Adult Health

## 2019-08-22 DIAGNOSIS — J8410 Pulmonary fibrosis, unspecified: Secondary | ICD-10-CM

## 2019-08-22 DIAGNOSIS — I4811 Longstanding persistent atrial fibrillation: Secondary | ICD-10-CM

## 2019-08-22 DIAGNOSIS — I129 Hypertensive chronic kidney disease with stage 1 through stage 4 chronic kidney disease, or unspecified chronic kidney disease: Secondary | ICD-10-CM | POA: Diagnosis not present

## 2019-08-22 DIAGNOSIS — N183 Chronic kidney disease, stage 3 (moderate): Secondary | ICD-10-CM | POA: Diagnosis not present

## 2019-08-22 DIAGNOSIS — E1122 Type 2 diabetes mellitus with diabetic chronic kidney disease: Secondary | ICD-10-CM | POA: Diagnosis not present

## 2019-08-22 NOTE — Progress Notes (Signed)
Location:    [place] Room Number: 129/P Place of Service:  SNF (31)   CODE STATUS: DNR  Allergies  Allergen Reactions   Zosyn [Piperacillin Sod-Tazobactam So] Anaphylaxis, Swelling and Other (See Comments)    Facial swelling Has patient had a PCN reaction causing immediate rash, facial/tongue/throat swelling, SOB or lightheadedness with hypotension: Yes Has patient had a PCN reaction causing severe rash involving mucus membranes or skin necrosis: No Has patient had a PCN reaction that required hospitalization: No Has patient had a PCN reaction occurring within the last 10 years: Yes If all of the above answers are "NO", then may proceed with Cephalosporin use.    Ace Inhibitors Cough   Vancomycin Swelling    Facial swelling   Chief Complaint  Patient presents with   Medical Management of Chronic Issues         Long standing persistent atrial fibrillation: . Hypertension associated with stage 3 chronic kidney disease due to  type 2 diabetes mellitus:  Pulmonary interstitial fibrosis     HPI:  He is a 85 year old long term resident of this facility being seen for the management of his chronic illnesses; afib; hypertension; fibrosis. He denies any cough; shortness of breath no uncontrolled pain.   Past Medical History:  Diagnosis Date   Acute cholecystitis 11/2012   Drained percutaneously   Anxiety    BPH (benign prostatic hypertrophy)    Deep venous thrombosis (HCC)    Left leg, diagnosed 7/12   Dementia ([place])    Needs supervision for safety per son - POA   Diverticulitis    Essential hypertension    GERD (gastroesophageal reflux disease)    History of pneumonia    Prior to 2012   Incontinence of urine    Mixed hyperlipidemia    Osteoarthritis    Pulmonary embolism ([place])    Diagnosed 7/12   Type 2 diabetes mellitus ([place])     Past Surgical History:  Procedure Laterality Date   CARPAL TUNNEL RELEASE     COLONOSCOPY   08/31/2012   Procedure: COLONOSCOPY;  Surgeon: Rogene Houston, MD;  Location[facility];  Service: Endoscopy;  Laterality: N/A;  [place] N/A 05/15/2018   Procedure: CYSTOSCOPY WITH BOTOX INJECTION;  Surgeon: Cleon Gustin, MD;  Location: [facility];  Service: Urology;  Laterality: N/A;   CYSTOSCOPY WITH INJECTION N/A 11/10/2018   Procedure: CYSTOSCOPY WITH BOTOX  INJECTION;  Surgeon: Cleon Gustin, MD;  Location: [facility];  Service: Urology;  Laterality: N/A;  [place] N/A 09/02/2016   Procedure: CYSTOSCOPY WITH INSERTION OF UROLIFT;  Surgeon: Cleon Gustin, MD;  Location: [facility];  Service: Urology;  Laterality: N/A;   EYE SURGERY Bilateral    bilateral cataract   Right total knee replacement  04/23/2011   SPINE SURGERY  prior to 2012   Neck fusion   UMBILICAL HERNIA REPAIR     umbilical    Social History   Socioeconomic History   Marital status: Widowed    Spouse name: Not on file   Number of children: Not on file   Years of education: Not on file   Highest education level: Not on file  Occupational History   Not on file  Social Needs   Financial resource strain: Not on file   Food insecurity    Worry: Not on file    Inability: Not  on file   Transportation needs    Medical: Not on file    Non-medical: Not on file  Tobacco Use   Smoking status: Never Smoker   Smokeless tobacco: Never Used  Substance and Sexual Activity   Alcohol use: No   Drug use: No   Sexual activity: Never  Lifestyle   Physical activity    Days per week: Not on file    Minutes per session: Not on file   Stress: Not on file  Relationships   Social connections    Talks on phone: Not on file    Gets together: Not on file    Attends religious service: Not on file    Active member of club or organization: Not on file    Attends meetings of clubs or organizations: Not on file     Relationship status: Not on file   Intimate partner violence    Fear of current or ex partner: Not on file    Emotionally abused: Not on file    Physically abused: Not on file    Forced sexual activity: Not on file  Other Topics Concern   Not on file  Social History Narrative   Not on file   Family History  Problem Relation Age of Onset   Cancer Mother        Stomach   Diabetes Sister    Hypertension Sister    Diabetes Brother    Cancer Brother        Gallbladder   Seizures Son        due to meningitis as a child      VITAL SIGNS BP (!) 115/58    Pulse 74    Temp 97.6 F (36.4 C) (Oral)    Resp 18    Ht 5\' 10"  (1.778 m)    Wt 232 lb 6.4 oz (105.4 kg)    SpO2 95%    BMI 33.35 kg/m   Facility-Administered Encounter Medications as of 08/22/2019  Medication   botulinum toxin Type A (BOTOX) injection 100 Units   Outpatient Encounter Medications as of 08/22/2019  Medication Sig   acetaminophen (TYLENOL) 500 MG tablet Take 1,000 mg by mouth every 6 (six) hours as needed for mild pain, moderate pain or headache.    atenolol (TENORMIN) 25 MG tablet Take 25 mg by mouth daily.   donepezil (ARICEPT) 10 MG tablet TAKE 1 TABLET AT BEDTIME   Glucerna (GLUCERNA) LIQD Take 237 mLs by mouth daily. Due to poor meal intake   Insulin Glargine (LANTUS SOLOSTAR) 100 UNIT/ML Solostar Pen Inject 8 Units into the skin every morning. 8:00 am   Lidocaine 4 % PT[facility] Instructions: apply 1/2 patch to both knees in the AM and remove at HS for bilateral knee arthritic pain Once A Day   linagliptin (TRADJENTA) 5 MG TABS tablet Take 5 mg by mouth daily.   loratadine (CLARITIN) 10 MG tablet Take 10 mg by mouth every morning.    magnesium hydroxide (MILK OF MAGNESIA) 400 MG/5ML suspension Take 30 mLs by mouth daily as needed for mild constipation. For constipation   NON FORMULARY Diet Type Change: Dysphagia 2, thin liquids (moisten meats with gravy) will allow banana cut into pieces upon  request   omeprazole (PRILOSEC) 40 MG capsule TAKE 1 CAPSULE TWICE DAILY   OXYGEN Inhale 2 L/min into the lungs as needed. May give oxygen at 2 L/min via nasal cannula, titrating to keep O2 saturation over 90%.  Inform physician ASAP for  acute onset dyspnea or O2 saturation < 88%   sertraline (ZOLOFT) 50 MG tablet Take 1 tablet (50 mg total) by mouth daily.   tamsulosin (FLOMAX) 0.4 MG CAPS capsule TAKE 1 CAPSULE EVERY DAY     SIGNIFICANT DIAGNOSTIC EXAMS   PREVIOUS;   07-08-19: ct of head an cervical spine:  1. No evidence of significant acute traumatic injury to the skull, brain or cervical spine. 2. Mild cerebral atrophy with extensive chronic microvascular ischemic changes in the cerebral white matter, as above. 3. Status post ACDF at C4-C6 with multilevel degenerative disc disease and cervical spondylosis, as above  07-08-19: chest x-ray:  1. No signs of significant acute traumatic injury to the thorax. 2. The appearance the chest is unusual. The possibility of congestive heart failure is considered, however, clinical correlation for signs and symptoms of severe acute bronchitis is recommended. 3. Aortic atherosclerosis.   07-08-19: bilateral hip x-ray: No acute osseous abnormality.   07-08-19; lumbar spine x-ray: Spondylosis, without acute osseous abnormality. Aortic Atherosclerosis   07-11-19: chest x-ray: Cardiomegaly with mild vascular congestion. No focal consolidation.    08-08-19: chest x-ray: No active cardiopulmonary disease.  NO NEW EXAMS.   LABS REVIEWED PREVIOUS;   04-24-19: hgb a1c 10.0 chol 130; ldl 76; tirg 115; hdl 34; vit D 51 07-08-19: wbc 5.8; hgb 11.3; hct 35.8; mcv 100.0; plt 192; glucose 123; bun 15; creat 1.58; k+ 5.1; na++ 140; ca 8.6 liver normal albumin 3.2; urine culture: no growth 07-10-19: wbc 5.7; hgb 12.6; hct 40.2; mcv 98.8 plt 194; glucose 146; bun 16; creat 1.41; k+ 4.3; na++ 140 ca 8.7 07-11-19: tsh 0.400 ammonia 14 07/24/2019: wbc 4.6; hgb 10.8; hct  34.3; mvc 100.6 plt 191; glucose 137; bun 17; creat 1.52; k+ 4.1; na+ 139; ca 8.1  08-08-19: wbc 7.3; hgb 10.4; hct 33.1; mcv 100.3; plt 227; glucose 292; bun 43; creat 2.27; k+ 5.2; na++ 134; ca 8.4   NO NEW LABS  Review of Systems  Constitutional: Negative for malaise/fatigue.  Respiratory: Negative for cough and shortness of breath.   Cardiovascular: Negative for chest pain, palpitations and leg swelling.  Gastrointestinal: Negative for abdominal pain, constipation and heartburn.  Musculoskeletal: Negative for back pain, joint pain and myalgias.  Skin: Negative.   Neurological: Negative for dizziness.  Psychiatric/Behavioral: The patient is not nervous/anxious.    Physical Exam Constitutional:      General: He is not in acute distress.    Appearance: He is well-developed and overweight. He is not diaphoretic.  Eyes:     Comments:  History of bilateral cataract removal      Neck:     Musculoskeletal: Neck supple.     Thyroid: No thyromegaly.  Cardiovascular:     Rate and Rhythm: Normal rate and regular rhythm.     Pulses: Normal pulses.     Heart sounds: Normal heart sounds.  Pulmonary:     Effort: Pulmonary effort is normal. No respiratory distress.     Breath sounds: Normal breath sounds.  Abdominal:     General: Bowel sounds are normal. There is no distension.     Palpations: Abdomen is soft.     Tenderness: There is no abdominal tenderness.  Musculoskeletal:     Right lower leg: Edema present.     Left lower leg: Edema present.     Comments: Chronic 1- 2+ bilateral lower extremity edema Is able to move all extremities  History of right total knee replacement and neck fusion  Is unable to stand at this time     Lymphadenopathy:     Cervical: No cervical adenopathy.  Skin:    General: Skin is warm and dry.  Neurological:     Mental Status: He is alert. Mental status is at baseline.  Psychiatric:        Mood and Affect: Mood normal.       ASSESSMENT/  PLAN:  TODAY:   1. Long standing persistent atrial fibrillation: heart rate is stable tenormin 25 mg nightly for rate control he is not a candidate for anticoagulation  2. Hypertension associated with stage 3 chronic kidney disease due to  type 2 diabetes mellitus: is stable b/p 115/58 will continue tenormin 25 mg daily   3. Pulmonary interstitial fibrosis: is without change uses 02 as needed   PREVIOUS  4. Chronic non-seasonal allergic rhinitis: is stable will continue claritin 10 mg daily   5. Uncontrolled diabetes mellitus with diabetic nephropathy with long term current use of insulin: is without change hgb a1c 10.0; will continue lantus 8 units nightly and tradjenta 5 mg daily and will continue to monitor his status.   6. Dyslipidemia associated with type 2 diabetes mellitus: is stable LDL 76 will is off mevacor will monitor    7. GERD without esophagitis: is stable will continue prilosec 40 mg daily twice daily    8. Late onset alzheimer's disease without behavioral disturbance; is without change weight is 232 pounds; will continue aricept 10 mg daily   9.  Generalized osteoarthritis: is stable   Will continue lidoderm patch 1/2 to each knee daily   10. CKD stage 3 due to diabetes mellitus type 2: is stable bun 17 creat 1.52 will monitor  11.  Chronic anemia: is stable hgb 10.4 will monitor iron has been stopped   12. Urinary frequency/benign prostatic hyperplasia with urinary retention: is stable will continue flomax 0.4 mg daily   13.  Major depression recurrent chronic: is stable will continue zoloft 50 mg daily  14. Vit D deficiency: is stable vit D 51 will continue to monitor vit D has been stopped.       MD is aware of resident's narcotic use and is in agreement with current plan of care. We will attempt to wean resident as appropriate.  Ok Edwards NP [facility]  Contact [phone] Monday through Friday 8am- 5pm  After hours call [phone]

## 2019-08-28 ENCOUNTER — Encounter: Payer: Self-pay | Admitting: Adult Health

## 2019-08-28 ENCOUNTER — Non-Acute Institutional Stay (SKILLED_NURSING_FACILITY): Payer: Medicare HMO | Admitting: Adult Health

## 2019-08-28 DIAGNOSIS — Z794 Long term (current) use of insulin: Secondary | ICD-10-CM

## 2019-08-28 DIAGNOSIS — E1165 Type 2 diabetes mellitus with hyperglycemia: Secondary | ICD-10-CM

## 2019-08-28 DIAGNOSIS — E1121 Type 2 diabetes mellitus with diabetic nephropathy: Secondary | ICD-10-CM

## 2019-08-28 DIAGNOSIS — IMO0002 Reserved for concepts with insufficient information to code with codable children: Secondary | ICD-10-CM

## 2019-08-28 NOTE — Progress Notes (Signed)
Location:    [place] Room Number: 129/P Place of Service:  SNF (31)   CODE STATUS: DNR  Allergies  Allergen Reactions  . Zosyn [Piperacillin Sod-Tazobactam So] Anaphylaxis, Swelling and Other (See Comments)    Facial swelling Has patient had a PCN reaction causing immediate rash, facial/tongue/throat swelling, SOB or lightheadedness with hypotension: Yes Has patient had a PCN reaction causing severe rash involving mucus membranes or skin necrosis: No Has patient had a PCN reaction that required hospitalization: No Has patient had a PCN reaction occurring within the last 10 years: Yes If all of the above answers are "NO", then may proceed with Cephalosporin use.   . Ace Inhibitors Cough  . Vancomycin Swelling    Facial swelling    Chief Complaint  Patient presents with  . Acute Visit    Diabetes Management, Blood Sugar 271 mg/dl    HPI:  He is insulin dependent. His cbg readings are all elevated. There are no reports of agitation; anxiety; no excessive thirst or hunger. No report of uncontrolled pain.   Past Medical History:  Diagnosis Date  . Acute cholecystitis 11/2012   Drained percutaneously  . Anxiety   . BPH (benign prostatic hypertrophy)   . Deep venous thrombosis (HCC)    Left leg, diagnosed 7/12  . Dementia [facility])    Needs supervision for safety per son - POA  . Diverticulitis   . Essential hypertension   . GERD (gastroesophageal reflux disease)   . History of pneumonia    Prior to 2012  . Incontinence of urine   . Mixed hyperlipidemia   . Osteoarthritis   . Pulmonary embolism ([place])    Diagnosed 7/12  . Type 2 diabetes mellitus ([place])     Past Surgical History:  Procedure Laterality Date  . CARPAL TUNNEL RELEASE    . COLONOSCOPY  08/31/2012   Procedure: COLONOSCOPY;  Surgeon: Rogene Houston, MD;  [facility];  Service: Endoscopy;  Laterality: N/A;  830  . CYSTOSCOPY WITH INJECTION N/A 05/15/2018   Procedure:  CYSTOSCOPY WITH BOTOX INJECTION;  Surgeon: Cleon Gustin, MD;  Location: [facility] ORS;  Service: Urology;  Laterality: N/A;  . CYSTOSCOPY WITH INJECTION N/A 11/10/2018   Procedure: CYSTOSCOPY WITH BOTOX  INJECTION;  Surgeon: Cleon Gustin, MD;  Location: [facility] ORS;  Service: Urology;  Laterality: N/A;  30 MINS  . CYSTOSCOPY WITH INSERTION OF UROLIFT N/A 09/02/2016   Procedure: CYSTOSCOPY WITH INSERTION OF UROLIFT;  Surgeon: Cleon Gustin, MD;  Location: [facility];  Service: Urology;  Laterality: N/A;  . EYE SURGERY Bilateral    bilateral cataract  . Right total knee replacement  04/23/2011  . SPINE SURGERY  prior to 2012   Neck fusion  . UMBILICAL HERNIA REPAIR     umbilical    Social History   Socioeconomic History  . Marital status: Widowed    Spouse name: Not on file  . Number of children: Not on file  . Years of education: Not on file  . Highest education level: Not on file  Occupational History  . Not on file  Social Needs  . Financial resource strain: Not on file  . Food insecurity    Worry: Not on file    Inability: Not on file  . Transportation needs    Medical: Not on file    Non-medical: Not on file  Tobacco Use  . Smoking status: Never Smoker  . Smokeless tobacco: Never  Used  Substance and Sexual Activity  . Alcohol use: No  . Drug use: No  . Sexual activity: Never  Lifestyle  . Physical activity    Days per week: Not on file    Minutes per session: Not on file  . Stress: Not on file  Relationships  . Social Herbalist on phone: Not on file    Gets together: Not on file    Attends religious service: Not on file    Active member of club or organization: Not on file    Attends meetings of clubs or organizations: Not on file    Relationship status: Not on file  . Intimate partner violence    Fear of current or ex partner: Not on file    Emotionally abused: Not on file    Physically abused: Not on file    Forced sexual  activity: Not on file  Other Topics Concern  . Not on file  Social History Narrative  . Not on file   Family History  Problem Relation Age of Onset  . Cancer Mother        Stomach  . Diabetes Sister   . Hypertension Sister   . Diabetes Brother   . Cancer Brother        Gallbladder  . Seizures Son        due to meningitis as a child      VITAL SIGNS BP 136/83   Pulse 100   Temp 98 F (36.7 C) (Oral)   Resp 18   Ht 5\' 10"  (1.778 m)   Wt 232 lb 6.4 oz (105.4 kg)   SpO2 95%   BMI 33.35 kg/m   Facility-Administered Encounter Medications as of 08/28/2019  Medication  . botulinum toxin Type A (BOTOX) injection 100 Units   Outpatient Encounter Medications as of 08/28/2019  Medication Sig  . acetaminophen (TYLENOL) 500 MG tablet Take 1,000 mg by mouth every 6 (six) hours as needed for mild pain, moderate pain or headache.   [name] atenolol (TENORMIN) 25 MG tablet Take 25 mg by mouth daily.  [name] donepezil (ARICEPT) 10 MG tablet TAKE 1 TABLET AT BEDTIME  . Glucerna (GLUCERNA) LIQD Take 237 mLs by mouth daily. Due to poor meal intake  . Insulin Glargine (LANTUS SOLOSTAR) 100 UNIT/ML Solostar Pen Inject 8 Units into the skin every morning. 8:00 am  . Lidocaine 4 % PTCH Instructions: apply 1/2 patch to both knees in the AM and remove at HS for bilateral knee arthritic pain Once A Day  . linagliptin (TRADJENTA) 5 MG TABS tablet Take 5 mg by mouth daily.  [name] loratadine (CLARITIN) 10 MG tablet Take 10 mg by mouth every morning.   . magnesium hydroxide (MILK OF MAGNESIA) 400 MG/5ML suspension Take 30 mLs by mouth daily as needed for mild constipation. For constipation  . NON FORMULARY Diet Type Change: Dysphagia 2, thin liquids (moisten meats with gravy) will allow banana cut into pieces upon request  . omeprazole (PRILOSEC) 40 MG capsule TAKE 1 CAPSULE TWICE DAILY  . OXYGEN Inhale 2 L/min into the lungs as needed. May give oxygen at 2 L/min via nasal cannula, titrating to keep O2 saturation  over 90%.  Inform physician ASAP for acute onset dyspnea or O2 saturation < 88%  . sertraline (ZOLOFT) 50 MG tablet Take 1 tablet (50 mg total) by mouth daily.  . tamsulosin (FLOMAX) 0.4 MG CAPS capsule TAKE 1 CAPSULE EVERY DAY     SIGNIFICANT  DIAGNOSTIC EXAMS   PREVIOUS;   07-08-19: ct of head an cervical spine:  1. No evidence of significant acute traumatic injury to the skull, brain or cervical spine. 2. Mild cerebral atrophy with extensive chronic microvascular ischemic changes in the cerebral white matter, as above. 3. Status post ACDF at C4-C6 with multilevel degenerative disc disease and cervical spondylosis, as above  07-08-19: chest x-ray:  1. No signs of significant acute traumatic injury to the thorax. 2. The appearance the chest is unusual. The possibility of congestive heart failure is considered, however, clinical correlation for signs and symptoms of severe acute bronchitis is recommended. 3. Aortic atherosclerosis.   07-08-19: bilateral hip x-ray: No acute osseous abnormality.   07-08-19; lumbar spine x-ray: Spondylosis, without acute osseous abnormality. Aortic Atherosclerosis   07-11-19: chest x-ray: Cardiomegaly with mild vascular congestion. No focal consolidation.    08-08-19: chest x-ray: No active cardiopulmonary disease.  NO NEW EXAMS.   LABS REVIEWED PREVIOUS;   04-24-19: hgb a1c 10.0 chol 130; ldl 76; tirg 115; hdl 34; vit D 51 07-08-19: wbc 5.8; hgb 11.3; hct 35.8; mcv 100.0; plt 192; glucose 123; bun 15; creat 1.58; k+ 5.1; na++ 140; ca 8.6 liver normal albumin 3.2; urine culture: no growth 07-10-19: wbc 5.7; hgb 12.6; hct 40.2; mcv 98.8 plt 194; glucose 146; bun 16; creat 1.41; k+ 4.3; na++ 140 ca 8.7 07-11-19: tsh 0.400 ammonia 14 07/21/2019: wbc 4.6; hgb 10.8; hct 34.3; mvc 100.6 plt 191; glucose 137; bun 17; creat 1.52; k+ 4.1; na+ 139; ca 8.1  08-08-19: wbc 7.3; hgb 10.4; hct 33.1; mcv 100.3; plt 227; glucose 292; bun 43; creat 2.27; k+ 5.2; na++ 134; ca 8.4   NO  NEW LABS   Review of Systems  Constitutional: Negative for malaise/fatigue.  Respiratory: Negative for cough and shortness of breath.   Cardiovascular: Negative for chest pain, palpitations and leg swelling.  Gastrointestinal: Negative for abdominal pain, constipation and heartburn.  Musculoskeletal: Negative for back pain, joint pain and myalgias.  Skin: Negative.   Neurological: Negative for dizziness.  Psychiatric/Behavioral: The patient is not nervous/anxious.     Physical Exam Constitutional:      General: He is not in acute distress.    Appearance: He is well-developed. He is not diaphoretic.  Eyes:     Comments: History of bilateral cataract removal       Neck:     Musculoskeletal: Neck supple.     Thyroid: No thyromegaly.  Cardiovascular:     Rate and Rhythm: Normal rate and regular rhythm.     Pulses: Normal pulses.     Heart sounds: Normal heart sounds.  Pulmonary:     Effort: Pulmonary effort is normal. No respiratory distress.     Breath sounds: Normal breath sounds.  Abdominal:     General: Bowel sounds are normal. There is no distension.     Palpations: Abdomen is soft.     Tenderness: There is no abdominal tenderness.  Musculoskeletal:     Right lower leg: Edema present.     Left lower leg: Edema present.     Comments: Chronic 1- 2+ bilateral lower extremity edema Is able to move all extremities  History of right total knee replacement and neck fusion    Is unable to stand at this time      Lymphadenopathy:     Cervical: No cervical adenopathy.  Skin:    General: Skin is warm and dry.  Neurological:     Mental Status: He  is alert. Mental status is at baseline.  Psychiatric:        Mood and Affect: Mood normal.      ASSESSMENT/ PLAN:  TODAY:   1. Uncontrolled diabetes mellitus with diabetic nephropathy with long term current use of insulin: hgb a1c 10.0; cbfs remain elevated: will continue tradjenta 5 mg daily will change to: lantus 14 units  nightly and will begin humalog 8 units with meals.     5. Uncontrolled diabetes mellitus with diabetic nephropathy with long term current use of insulin: is without change hgb a1c 10.0; will continue lantus 8 units nightly and tradjenta 5 mg daily and will continue to monitor his status.        MD is aware of resident's narcotic use and is in agreement with current plan of care. We will attempt to wean resident as appropriate.  Ok Edwards NP [facility]  Contact [phone] Monday through Friday 8am- 5pm  After hours call [phone]

## 2019-08-29 ENCOUNTER — Encounter: Payer: Self-pay | Admitting: Adult Health

## 2019-08-29 ENCOUNTER — Non-Acute Institutional Stay (SKILLED_NURSING_FACILITY): Payer: Medicare HMO | Admitting: Adult Health

## 2019-08-29 DIAGNOSIS — F0280 Dementia in other diseases classified elsewhere without behavioral disturbance: Secondary | ICD-10-CM | POA: Diagnosis not present

## 2019-08-29 DIAGNOSIS — R627 Adult failure to thrive: Secondary | ICD-10-CM | POA: Diagnosis not present

## 2019-08-29 DIAGNOSIS — F339 Major depressive disorder, recurrent, unspecified: Secondary | ICD-10-CM

## 2019-08-29 DIAGNOSIS — G301 Alzheimer's disease with late onset: Secondary | ICD-10-CM

## 2019-08-29 NOTE — Progress Notes (Signed)
Location:    [place] Room Number: 129/P Place of Service:  SNF (31)   CODE STATUS: DNR  Allergies  Allergen Reactions  . Zosyn [Piperacillin Sod-Tazobactam So] Anaphylaxis, Swelling and Other (See Comments)    Facial swelling Has patient had a PCN reaction causing immediate rash, facial/tongue/throat swelling, SOB or lightheadedness with hypotension: Yes Has patient had a PCN reaction causing severe rash involving mucus membranes or skin necrosis: No Has patient had a PCN reaction that required hospitalization: No Has patient had a PCN reaction occurring within the last 10 years: Yes If all of the above answers are "NO", then may proceed with Cephalosporin use.   . Ace Inhibitors Cough  . Vancomycin Swelling    Facial swelling    Chief Complaint  Patient presents with  . Acute Visit    Care Plan Meeting    HPI:  We have come together for his care plan meeting. BIMS 4/25 mood 3/30. He has family present via phone. The focus of his care continues to be comfort. There are no reports of uncontrolled pain. He continues to slowly lose weight. His food and fluid intake are poor. He does get out of bed daily.   Past Medical History:  Diagnosis Date  . Acute cholecystitis 11/2012   Drained percutaneously  . Anxiety   . BPH (benign prostatic hypertrophy)   . Deep venous thrombosis (HCC)    Left leg, diagnosed 7/12  . Dementia [facility])    Needs supervision for safety per son - POA  . Diverticulitis   . Essential hypertension   . GERD (gastroesophageal reflux disease)   . History of pneumonia    Prior to 2012  . Incontinence of urine   . Mixed hyperlipidemia   . Osteoarthritis   . Pulmonary embolism ([place])    Diagnosed 7/12  . Type 2 diabetes mellitus ([place])     Past Surgical History:  Procedure Laterality Date  . CARPAL TUNNEL RELEASE    . COLONOSCOPY  08/31/2012   Procedure: COLONOSCOPY;  Surgeon: Rogene Houston, MD;  Location[facility];   Service: Endoscopy;  Laterality: N/A;  830  . CYSTOSCOPY WITH INJECTION N/A 05/15/2018   Procedure: CYSTOSCOPY WITH BOTOX INJECTION;  Surgeon: Cleon Gustin, MD;  Location: [facility];  Service: Urology;  Laterality: N/A;  . CYSTOSCOPY WITH INJECTION N/A 11/10/2018   Procedure: CYSTOSCOPY WITH BOTOX  INJECTION;  Surgeon: Cleon Gustin, MD;  Location: [facility];  Service: Urology;  Laterality: N/A;  30 MINS  . CYSTOSCOPY WITH INSERTION OF UROLIFT N/A 09/02/2016   Procedure: CYSTOSCOPY WITH INSERTION OF UROLIFT;  Surgeon: Cleon Gustin, MD;  Location: [facility];  Service: Urology;  Laterality: N/A;  . EYE SURGERY Bilateral    bilateral cataract  . Right total knee replacement  04/23/2011  . SPINE SURGERY  prior to 2012   Neck fusion  . UMBILICAL HERNIA REPAIR     umbilical    Social History   Socioeconomic History  . Marital status: Widowed    Spouse name: Not on file  . Number of children: Not on file  . Years of education: Not on file  . Highest education level: Not on file  Occupational History  . Not on file  Social Needs  . Financial resource strain: Not on file  . Food insecurity    Worry: Not on file    Inability: Not on file  . Transportation needs  Medical: Not on file    Non-medical: Not on file  Tobacco Use  . Smoking status: Never Smoker  . Smokeless tobacco: Never Used  Substance and Sexual Activity  . Alcohol use: No  . Drug use: No  . Sexual activity: Never  Lifestyle  . Physical activity    Days per week: Not on file    Minutes per session: Not on file  . Stress: Not on file  Relationships  . Social Herbalist on phone: Not on file    Gets together: Not on file    Attends religious service: Not on file    Active member of club or organization: Not on file    Attends meetings of clubs or organizations: Not on file    Relationship status: Not on file  . Intimate partner violence    Fear of current or ex partner:  Not on file    Emotionally abused: Not on file    Physically abused: Not on file    Forced sexual activity: Not on file  Other Topics Concern  . Not on file  Social History Narrative  . Not on file   Family History  Problem Relation Age of Onset  . Cancer Mother        Stomach  . Diabetes Sister   . Hypertension Sister   . Diabetes Brother   . Cancer Brother        Gallbladder  . Seizures Son        due to meningitis as a child      VITAL SIGNS BP 126/72   Pulse 73   Temp 97.6 F (36.4 C) (Oral)   Resp 20   Ht 5\' 10"  (1.778 m)   Wt 232 lb 6.4 oz (105.4 kg)   SpO2 95%   BMI 33.35 kg/m   Facility-Administered Encounter Medications as of 08/29/2019  Medication  . botulinum toxin Type A (BOTOX) injection 100 Units   Outpatient Encounter Medications as of 08/29/2019  Medication Sig  . acetaminophen (TYLENOL) 500 MG tablet Take 1,000 mg by mouth every 6 (six) hours as needed for mild pain, moderate pain or headache.   [name] atenolol (TENORMIN) 25 MG tablet Take 25 mg by mouth daily.  [name] donepezil (ARICEPT) 10 MG tablet TAKE 1 TABLET AT BEDTIME  . Glucerna (GLUCERNA) LIQD Take 237 mLs by mouth daily. Due to poor meal intake  . Insulin Glargine (LANTUS SOLOSTAR) 100 UNIT/ML Solostar Pen Inject 14 Units into the skin every morning. 8:00 am   . insulin lispro (HUMALOG KWIKPEN) 100 UNIT/ML KwikPen Inject 8 Units into the skin 3 (three) times daily with meals.  . Lidocaine 4 % PTCH Instructions: apply 1/2 patch to both knees in the AM and remove at HS for bilateral knee arthritic pain Once A Day  . linagliptin (TRADJENTA) 5 MG TABS tablet Take 5 mg by mouth daily.  [name] loratadine (CLARITIN) 10 MG tablet Take 10 mg by mouth every morning.   . magnesium hydroxide (MILK OF MAGNESIA) 400 MG/5ML suspension Take 30 mLs by mouth daily as needed for mild constipation. For constipation  . NON FORMULARY Diet Type Change: Dysphagia 2, thin liquids (moisten meats with gravy) will allow banana cut  into pieces upon request  . omeprazole (PRILOSEC) 40 MG capsule TAKE 1 CAPSULE TWICE DAILY  . OXYGEN Inhale 2 L/min into the lungs as needed. May give oxygen at 2 L/min via nasal cannula, titrating to keep O2 saturation over 90%.  Inform physician ASAP for acute onset dyspnea or O2 saturation < 88%  . sertraline (ZOLOFT) 50 MG tablet Take 1 tablet (50 mg total) by mouth daily.  . tamsulosin (FLOMAX) 0.4 MG CAPS capsule TAKE 1 CAPSULE EVERY DAY     SIGNIFICANT DIAGNOSTIC EXAMS    PREVIOUS;   07-08-19: ct of head an cervical spine:  1. No evidence of significant acute traumatic injury to the skull, brain or cervical spine. 2. Mild cerebral atrophy with extensive chronic microvascular ischemic changes in the cerebral white matter, as above. 3. Status post ACDF at C4-C6 with multilevel degenerative disc disease and cervical spondylosis, as above  07-08-19: chest x-ray:  1. No signs of significant acute traumatic injury to the thorax. 2. The appearance the chest is unusual. The possibility of congestive heart failure is considered, however, clinical correlation for signs and symptoms of severe acute bronchitis is recommended. 3. Aortic atherosclerosis.   07-08-19: bilateral hip x-ray: No acute osseous abnormality.   07-08-19; lumbar spine x-ray: Spondylosis, without acute osseous abnormality. Aortic Atherosclerosis   07-11-19: chest x-ray: Cardiomegaly with mild vascular congestion. No focal consolidation.    08-08-19: chest x-ray: No active cardiopulmonary disease.  NO NEW EXAMS.   LABS REVIEWED PREVIOUS;   04-24-19: hgb a1c 10.0 chol 130; ldl 76; tirg 115; hdl 34; vit D 51 07-08-19: wbc 5.8; hgb 11.3; hct 35.8; mcv 100.0; plt 192; glucose 123; bun 15; creat 1.58; k+ 5.1; na++ 140; ca 8.6 liver normal albumin 3.2; urine culture: no growth 07-10-19: wbc 5.7; hgb 12.6; hct 40.2; mcv 98.8 plt 194; glucose 146; bun 16; creat 1.41; k+ 4.3; na++ 140 ca 8.7 07-11-19: tsh 0.400 ammonia 14 07/26/2019: wbc  4.6; hgb 10.8; hct 34.3; mvc 100.6 plt 191; glucose 137; bun 17; creat 1.52; k+ 4.1; na+ 139; ca 8.1  08-08-19: wbc 7.3; hgb 10.4; hct 33.1; mcv 100.3; plt 227; glucose 292; bun 43; creat 2.27; k+ 5.2; na++ 134; ca 8.4   NO NEW LABS  Review of Systems  Constitutional: Negative for malaise/fatigue.  Respiratory: Negative for cough and shortness of breath.   Cardiovascular: Negative for chest pain, palpitations and leg swelling.  Gastrointestinal: Negative for abdominal pain, constipation and heartburn.  Musculoskeletal: Negative for back pain, joint pain and myalgias.  Skin: Negative.   Neurological: Negative for dizziness.  Psychiatric/Behavioral: The patient is not nervous/anxious.    Physical Exam Constitutional:      General: He is not in acute distress.    Appearance: He is well-developed. He is not diaphoretic.  Eyes:     Comments: History of bilateral cataract removal        Neck:     Musculoskeletal: Neck supple.     Thyroid: No thyromegaly.  Cardiovascular:     Rate and Rhythm: Normal rate and regular rhythm.     Pulses: Normal pulses.     Heart sounds: Normal heart sounds.  Pulmonary:     Effort: Pulmonary effort is normal. No respiratory distress.     Breath sounds: Normal breath sounds.  Abdominal:     General: Bowel sounds are normal. There is no distension.     Palpations: Abdomen is soft.     Tenderness: There is no abdominal tenderness.  Musculoskeletal:     Right lower leg: Edema present.     Left lower leg: Edema present.     Comments: Chronic 1- 2+ bilateral lower extremity edema Is able to move all extremities  History of right total knee replacement and  neck fusion    Is unable to stand at this time       Lymphadenopathy:     Cervical: No cervical adenopathy.  Skin:    General: Skin is warm and dry.  Neurological:     Mental Status: He is alert. Mental status is at baseline.  Psychiatric:        Mood and Affect: Mood normal.      ASSESSMENT/  PLAN:  TODAY;   1. Late onset alzheimer's disease without behavioral disturbance 2. Major depression recurrent chronic 3. Failure to thrive adult  Will continue current medications Will continue current plan of care Goal is comfort care Will monitor   MD is aware of resident's narcotic use and is in agreement with current plan of care. We will attempt to wean resident as appropriate.  Ok Edwards NP [facility]  Contact [phone] Monday through Friday 8am- 5pm  After hours call [phone]

## 2019-09-03 ENCOUNTER — Ambulatory Visit: Payer: Self-pay

## 2019-09-03 ENCOUNTER — Telehealth: Payer: Self-pay | Admitting: *Deleted

## 2019-09-03 NOTE — Telephone Encounter (Signed)
fyi

## 2019-09-03 NOTE — Telephone Encounter (Signed)
Spoke with pt son [place] on [place] he thinks pt is going to stay long term at [facility] as since he has been unable to see him he has declined. Called to ask about AWV but we cancelled for now to see how things go. Just wanted to make Dr Moshe Cipro aware.

## 2019-09-03 NOTE — Telephone Encounter (Signed)
Noted, thanks!

## 2019-09-04 ENCOUNTER — Ambulatory Visit: Payer: Self-pay

## 2019-09-04 ENCOUNTER — Ambulatory Visit: Payer: Medicare HMO | Admitting: Family Medicine

## 2019-09-18 ENCOUNTER — Other Ambulatory Visit: Payer: Self-pay | Admitting: Adult Health

## 2019-09-19 ENCOUNTER — Non-Acute Institutional Stay (SKILLED_NURSING_FACILITY): Payer: Medicare HMO | Admitting: Internal Medicine

## 2019-09-19 ENCOUNTER — Other Ambulatory Visit: Payer: Self-pay | Admitting: Adult Health

## 2019-09-19 DIAGNOSIS — J3089 Other allergic rhinitis: Secondary | ICD-10-CM

## 2019-09-19 DIAGNOSIS — J8410 Pulmonary fibrosis, unspecified: Secondary | ICD-10-CM | POA: Diagnosis not present

## 2019-09-19 DIAGNOSIS — E1122 Type 2 diabetes mellitus with diabetic chronic kidney disease: Secondary | ICD-10-CM | POA: Diagnosis not present

## 2019-09-19 DIAGNOSIS — N183 Chronic kidney disease, stage 3 unspecified: Secondary | ICD-10-CM

## 2019-09-19 DIAGNOSIS — I129 Hypertensive chronic kidney disease with stage 1 through stage 4 chronic kidney disease, or unspecified chronic kidney disease: Secondary | ICD-10-CM | POA: Diagnosis not present

## 2019-09-19 NOTE — Progress Notes (Signed)
Location:  [facility] Room Number: 129-P Place of Service:  SNF (31)  Hennie Duos, MD  Patient Care Team: Hennie Duos, MD as PCP - General (Internal Medicine) Satira Sark, MD as PCP - Cardiology (Cardiology) Alyson Ingles [name], MD as Consulting Physician (Urology) Nyoka Cowden Phylis Bougie, NP as Nurse Practitioner ([place]) [facility], [place] ([place])  Extended Emergency Contact Information Primary Emergency Contact: [facility] Address: [address], [place] [address] [place] Phone: [phone] Relation: Son    Allergies: Zosyn [piperacillin sod-tazobactam so], Ace inhibitors, and Vancomycin  Chief Complaint  Patient presents with  . Medical Management of Chronic Issues    Routine    HPI: Patient is 85 y.o. male who is being seen for routine issues of hypertension, allergic rhinitis, and pulmonary interstitial fibrosis.  Past Medical History:  Diagnosis Date  . Acute cholecystitis 11/2012   Drained percutaneously  . Anxiety   . BPH (benign prostatic hypertrophy)   . Deep venous thrombosis (HCC)    Left leg, diagnosed 7/12  . Dementia [facility])    Needs supervision for safety per son - POA  . Diverticulitis   . Essential hypertension   . GERD (gastroesophageal reflux disease)   . History of pneumonia    Prior to 2012  . Incontinence of urine   . Mixed hyperlipidemia   . Osteoarthritis   . Pulmonary embolism ([place])    Diagnosed 7/12  . Type 2 diabetes mellitus ([place])     Past Surgical History:  Procedure Laterality Date  . CARPAL TUNNEL RELEASE    . COLONOSCOPY  08/31/2012   Procedure: COLONOSCOPY;  Surgeon: Rogene Houston, MD;  Location: [facility];  Service: Endoscopy;  Laterality: N/A;  830  . CYSTOSCOPY WITH INJECTION N/A 05/15/2018   Procedure: CYSTOSCOPY WITH BOTOX INJECTION;  Surgeon: Cleon Gustin, MD;  Location: [facility] ORS;  Service:  Urology;  Laterality: N/A;  . CYSTOSCOPY WITH INJECTION N/A 11/10/2018   Procedure: CYSTOSCOPY WITH BOTOX  INJECTION;  Surgeon: Cleon Gustin, MD;  Location: [facility] ORS;  Service: Urology;  Laterality: N/A;  30 MINS  . CYSTOSCOPY WITH INSERTION OF UROLIFT N/A 09/02/2016   Procedure: CYSTOSCOPY WITH INSERTION OF UROLIFT;  Surgeon: Cleon Gustin, MD;  Location: [facility];  Service: Urology;  Laterality: N/A;  . EYE SURGERY Bilateral    bilateral cataract  . Right total knee replacement  04/23/2011  . SPINE SURGERY  prior to 2012   Neck fusion  . UMBILICAL HERNIA REPAIR     umbilical    Allergies as of 09/19/2019      Reactions   Zosyn [piperacillin Sod-tazobactam So] Anaphylaxis, Swelling, Other (See Comments)   Facial swelling Has patient had a PCN reaction causing immediate rash, facial/tongue/throat swelling, SOB or lightheadedness with hypotension: Yes Has patient had a PCN reaction causing severe rash involving mucus membranes or skin necrosis: No Has patient had a PCN reaction that required hospitalization: No Has patient had a PCN reaction occurring within the last 10 years: Yes If all of the above answers are "NO", then may proceed with Cephalosporin use.   Ace Inhibitors Cough   Vancomycin Swelling   Facial swelling      Medication List    Notice   This visit is during an admission. Changes to the med list made in this visit will be reflected in the  After Visit Summary of the admission.    Current Facility-Administered Medications on File Prior to Visit  Medication Dose Route Frequency Provider Last Rate Last Dose  . botulinum toxin Type A (BOTOX) injection 100 Units  100 Units Intramuscular Once McKenzie, [name], MD       Current Outpatient Medications on File Prior to Visit  Medication Sig Dispense Refill  . acetaminophen (TYLENOL) 500 MG tablet Take 1,000 mg by mouth every 6 (six) hours as needed for mild pain, moderate pain or headache.      [name] atenolol (TENORMIN) 25 MG tablet Take 25 mg by mouth daily.    Roseanne Kaufman Peru-Castor Oil (VENELEX) OINT Apply 1 application topically 3 (three) times daily. Apply to entire buttocks    . donepezil (ARICEPT) 10 MG tablet TAKE 1 TABLET AT BEDTIME 90 tablet 1  . Glucerna (GLUCERNA) LIQD Take 237 mLs by mouth daily. Due to poor meal intake    . Insulin Glargine (LANTUS SOLOSTAR) 100 UNIT/ML Solostar Pen Inject 14 Units into the skin every morning. 8:00 am     . insulin lispro (HUMALOG KWIKPEN) 100 UNIT/ML KwikPen Inject 8 Units into the skin 3 (three) times daily with meals.    . Lidocaine 4 % [facility] Instructions: apply 1/2 patch to both knees in the AM and remove at HS for bilateral knee arthritic pain Once A Day    . linagliptin (TRADJENTA) 5 MG TABS tablet Take 5 mg by mouth daily.    [name] loratadine (CLARITIN) 10 MG tablet Take 10 mg by mouth every morning.     . magnesium hydroxide (MILK OF MAGNESIA) 400 MG/5ML suspension Take 30 mLs by mouth daily as needed for mild constipation. For constipation    . NON FORMULARY Diet Type Change: Dysphagia 2, thin liquids (moisten meats with gravy) will allow banana cut into pieces upon request    . omeprazole (PRILOSEC) 40 MG capsule TAKE 1 CAPSULE TWICE DAILY 180 capsule 0  . OXYGEN Inhale 2 L/min into the lungs continuous. May give oxygen at 2 L/min via nasal cannula, titrating to keep O2 saturation over 90%.  Inform physician ASAP for acute onset dyspnea or O2 saturation < 88%    . sertraline (ZOLOFT) 50 MG tablet Take 1 tablet (50 mg total) by mouth daily. 90 tablet 3  . tamsulosin (FLOMAX) 0.4 MG CAPS capsule TAKE 1 CAPSULE EVERY DAY 90 capsule 0     No orders of the defined types were placed in this encounter.   Immunization History  Administered Date(s) Administered  . Influenza Split 10/14/2011, 09/29/2013, 09/03/2019  . Influenza Whole 09/07/2007, 09/04/2009, 08/04/2010  . Influenza,inj,Quad PF,6+ Mos 09/09/2014, 08/13/2016, 10/05/2017,  08/28/2018  . Pneumococcal Conjugate-13 07/16/2014  . Pneumococcal Polysaccharide-23 08/04/2010  . Td 07/28/2004, 08/21/2019  . Zoster 03/08/2007    Social History   Tobacco Use  . Smoking status: Never Smoker  . Smokeless tobacco: Never Used  Substance Use Topics  . Alcohol use: No    Review of Systems  DATA OBTAINED: from patient-very limited; nursing-no acute concerns GENERAL:  no fevers, fatigue, appetite changes SKIN: No itching, rash HEENT: No complaint RESPIRATORY: No cough, wheezing, SOB CARDIAC: No chest pain, palpitations, lower extremity edema  GI: No abdominal pain, No N/V/D or constipation, No heartburn or reflux  GU: No dysuria, frequency or urgency, or incontinence  MUSCULOSKELETAL: No unrelieved bone/joint pain NEUROLOGIC: No headache, dizziness  PSYCHIATRIC: No overt anxiety or sadness  Vitals:   09/19/19 1637  BP: 91/60  Pulse: (!) 114  Resp: 16  Temp: 98.6 F (37 C)  SpO2: 98%   Body mass index is 32.11 kg/m. Physical Exam  GENERAL APPEARANCE: Alert, moderately conversant, No acute distress  SKIN: No diaphoresis rash HEENT: Unremarkable RESPIRATORY: Breathing is even, unlabored. Lung sounds are clear   CARDIOVASCULAR: Heart RRR no murmurs, rubs or gallops.  1+ peripheral edema  GASTROINTESTINAL: Abdomen is soft, non-tender, not distended w/ normal bowel sounds.  GENITOURINARY: Bladder non tender, not distended  MUSCULOSKELETAL: No abnormal joints or musculature NEUROLOGIC: Cranial nerves 2-12 grossly intact. Moves all extremities PSYCHIATRIC: Mood and affect appropriate with some dementia, no behavioral issues  Patient Active Problem List   Diagnosis Date Noted  . Failure to thrive in adult 08/15/2019  . Acute renal failure superimposed on stage 3 chronic kidney disease ([place]) 08/11/2019  . Oral thrush 08/04/2019  . Physical deconditioning 08/03/2019  . Hypertension associated with stage 3 chronic kidney disease due to type 2 diabetes  mellitus ([place]) 07/16/2019  . Dyslipidemia associated with type 2 diabetes mellitus ([place]) 07/16/2019  . GERD without esophagitis 07/16/2019  . CKD stage 3 due to type 2 diabetes mellitus ([place]) 07/16/2019  . Major depression, recurrent, chronic ([place]) 07/16/2019  . Falls 07/08/2019  . Knee pain, left 10/10/2018  . History of total knee arthroplasty, right 10/10/2018  . Unilateral primary osteoarthritis, left knee 10/10/2018  . Paroxysmal SVT (supraventricular tachycardia) ([place]) 09/25/2018  . Uncontrolled type 2 diabetes mellitus with hyperglycemia ([place]) 09/22/2018  . Dementia without behavioral disturbance ([place]) 09/22/2018  . Morbid obesity ([place]) 06/12/2018  . Chronic anemia 09/14/2017  . CKD (chronic kidney disease) stage 3, GFR 30-59 ml/min 09/14/2017  . Atrial fibrillation ([place]) 09/14/2017  . Hypoalbuminemia 11/23/2016  . Hypoxia 10/29/2016  . Urinary frequency 09/17/2015  . Generalized osteoarthritis 07/16/2014  . Urinary incontinence 07/16/2014  . Pulmonary interstitial fibrosis ([place]) 07/03/2014  . Thyroid mass of unclear etiology 05/07/2014  . Vitamin D deficiency 01/31/2014  . Chronic non-seasonal allergic rhinitis 01/31/2014  . Diverticulosis of sigmoid colon 12/13/2012  . Alzheimer's dementia without behavioral disturbance ([place]) 09/12/2010  . Goiter 06/19/2009  . Essential hypertension 12/11/2008  . Uncontrolled diabetes mellitus with diabetic nephropathy, with long-term current use of insulin ([place]) 09/05/2008  . Hyperlipidemia with target LDL less than 100 07/19/2008  . BPH (benign prostatic hyperplasia) 03/28/2008    CMP     Component Value Date/Time   NA 134 (L) 08/08/2019 1315   K 5.2 (H) 08/08/2019 1315   CL 104 08/08/2019 1315   CO2 23 08/08/2019 1315   GLUCOSE 292 (H) 08/08/2019 1315   BUN 43 (H) 08/08/2019 1315   CREATININE 2.27 (H) 08/08/2019 1315   CREATININE 1.61 (H) 04/24/2019 0928   CALCIUM 8.4 (L) 08/08/2019 1315   PROT 7.8 07/08/2019 1039   ALBUMIN  3.2 (L) 07/08/2019 1039   AST 25 07/08/2019 1039   ALT 14 07/08/2019 1039   ALKPHOS 65 07/08/2019 1039   BILITOT 1.0 07/08/2019 1039   GFRNONAA 25 (L) 08/08/2019 1315   GFRNONAA 38 (L) 04/24/2019 0928   GFRAA 29 (L) 08/08/2019 1315   GFRAA 44 (L) 04/24/2019 0928   Recent Labs    09/24/18 0603 09/25/18 0605  07/12/19 0438 07/22/2019 0607 08/08/19 1315  NA 135 135   < > 139 139 134*  K 4.7 4.5   < > 4.1 4.1 5.2*  CL 104 104   < > 108 106 104  CO2 24 23   < > 25 26  23  GLUCOSE 154* 148*   < > 116* 137* 292*  BUN 20 21   < > 25* 17 43*  CREATININE 1.74* 1.83*   < > 1.81* 1.52* 2.27*  CALCIUM 8.0* 8.1*   < > 8.1* 8.1* 8.4*  MG 2.3 2.2  --   --   --   --    < > = values in this interval not displayed.   Recent Labs    11/21/18 0036 04/24/19 0928 05/22/19 0151 07/08/19 1039  AST 22 22 28 25   ALT 17 12 16 14   ALKPHOS 68  --  60 65  BILITOT 0.8 0.9 0.7 1.0  PROT 7.3 7.2 6.7 7.8  ALBUMIN 3.1*  --  2.9* 3.2*   Recent Labs    05/22/19 0151 07/08/19 1039  07/12/19 0438 07/30/2019 0607 08/08/19 1315  WBC 5.0 5.8   < > 5.3 4.6 7.3  NEUTROABS 2.9 3.5  --   --   --  5.3  HGB 10.8* 11.3*   < > 10.8* 10.8* 10.4*  HCT 33.5* 35.8*   < > 33.5* 34.3* 33.1*  MCV 98.2 100.0   < > 98.8 100.6* 100.3*  PLT 150 192   < > 188 191 227   < > = values in this interval not displayed.   Recent Labs    09/24/18 0603 04/24/19 0928  CHOL 112 130  LDLCALC 65 76  TRIG 98 115   Lab Results  Component Value Date   MICROALBUR 1.3 04/24/2019   Lab Results  Component Value Date   TSH 0.400 07/11/2019   Lab Results  Component Value Date   HGBA1C 10.0 (H) 04/24/2019   Lab Results  Component Value Date   CHOL 130 04/24/2019   HDL 34 (L) 04/24/2019   LDLCALC 76 04/24/2019   TRIG 115 04/24/2019   CHOLHDL 3.8 04/24/2019    Significant Diagnostic Results in last 30 days:  No results found.  Assessment and Plan  Hypertension associated with stage 3 chronic kidney disease due to type  2 diabetes mellitus ([place]) Controlled; continue Tenormin 25 mg daily  Chronic non-seasonal allergic rhinitis Chronic and stable; continue Claritin 10 mg daily  Pulmonary interstitial fibrosis (HCC) No apparent problems with breathing or wheezing; continue supportive care     Hennie Duos, MD

## 2019-09-21 DIAGNOSIS — B351 Tinea unguium: Secondary | ICD-10-CM | POA: Diagnosis not present

## 2019-09-21 DIAGNOSIS — M2142 Flat foot [pes planus] (acquired), left foot: Secondary | ICD-10-CM | POA: Diagnosis not present

## 2019-09-21 DIAGNOSIS — Z794 Long term (current) use of insulin: Secondary | ICD-10-CM | POA: Diagnosis not present

## 2019-09-21 DIAGNOSIS — E1151 Type 2 diabetes mellitus with diabetic peripheral angiopathy without gangrene: Secondary | ICD-10-CM | POA: Diagnosis not present

## 2019-09-22 ENCOUNTER — Encounter: Payer: Self-pay | Admitting: Internal Medicine

## 2019-09-22 NOTE — Assessment & Plan Note (Signed)
Chronic and stable; continue Claritin 10 mg daily 

## 2019-09-22 NOTE — Assessment & Plan Note (Signed)
Controlled; continue Tenormin 25 mg daily

## 2019-09-22 NOTE — Assessment & Plan Note (Signed)
No apparent problems with breathing or wheezing; continue supportive care

## 2019-10-08 DIAGNOSIS — E119 Type 2 diabetes mellitus without complications: Secondary | ICD-10-CM | POA: Diagnosis not present

## 2019-10-08 DIAGNOSIS — Z7984 Long term (current) use of oral hypoglycemic drugs: Secondary | ICD-10-CM | POA: Diagnosis not present

## 2019-10-08 DIAGNOSIS — Z961 Presence of intraocular lens: Secondary | ICD-10-CM | POA: Diagnosis not present

## 2019-10-08 DIAGNOSIS — Z794 Long term (current) use of insulin: Secondary | ICD-10-CM | POA: Diagnosis not present

## 2019-10-18 ENCOUNTER — Encounter: Payer: Self-pay | Admitting: Adult Health

## 2019-10-18 ENCOUNTER — Non-Acute Institutional Stay (SKILLED_NURSING_FACILITY): Payer: Medicare HMO | Admitting: Adult Health

## 2019-10-18 DIAGNOSIS — I4811 Longstanding persistent atrial fibrillation: Secondary | ICD-10-CM | POA: Diagnosis not present

## 2019-10-18 DIAGNOSIS — J8410 Pulmonary fibrosis, unspecified: Secondary | ICD-10-CM | POA: Diagnosis not present

## 2019-10-18 DIAGNOSIS — Z0000 Encounter for general adult medical examination without abnormal findings: Secondary | ICD-10-CM | POA: Diagnosis not present

## 2019-10-18 DIAGNOSIS — G301 Alzheimer's disease with late onset: Secondary | ICD-10-CM

## 2019-10-18 DIAGNOSIS — F0280 Dementia in other diseases classified elsewhere without behavioral disturbance: Secondary | ICD-10-CM | POA: Diagnosis not present

## 2019-10-18 NOTE — Progress Notes (Signed)
Location:  [facility] Room Number: 129/P Place of Service:  SNF (31) Provider: Ok Edwards Np Patient Care Team: Hennie Duos, MD as PCP - General (Internal Medicine) Satira Sark, MD as PCP - Cardiology (Cardiology) McKenzie, [name], MD as Consulting Physician (Urology) Nyoka Cowden Phylis Bougie, NP as Nurse Practitioner ([place]) Center, [place] ([place])  Extended Emergency Contact Information Primary Emergency Contact: [facility] Address: [place], [place] [address] [address]net[address] [place]itter of [place] Phone: [phone] Relation: Son  Code Status: dnr Goals of Care: Advanced Directive information Advanced Directives 10/18/2019  Does Patient Have a Medical Advance Directive? Yes  Type of Advance Directive Out of facility DNR (pink MOST or yellow form)  Does patient want to make changes to medical advance directive? No - Patient declined  Copy of [place] in Chart? -  Would patient like information on creating a medical advance directive? -  Pre-existing out of facility DNR order (yellow form or pink MOST form) -     Chief Complaint  Patient presents with  . Medicare Wellness    Annual Wellness Visit    HPI: Patient is a 85 y.o. male seen in today for an annual wellness exam.  He was admitted to this facility is August of this year. He has had 2 falls since that time without injury. He denies any uncontrolled pain. He has lost about 20 pounds since his admission which is a positive change. There are no reports of agitation or anxiety. He continues to be followed for his chronic illnesses including: afib; dementia; fibrosis.   Depression screen [facility] 2/9 10/18/2019 05/21/2019 03/28/2019 11/28/2018 10/10/2018  Decreased Interest 0 0 0 2 1  Down, Depressed, Hopeless 0 0 0 0 0  PHQ - 2 Score 0 0 0 2 1  Altered sleeping - - - 1 2  Tired, decreased energy - - - 3 0   Change in appetite - - - 0 2  Feeling bad or failure about yourself  - - - 0 0  Trouble concentrating - - - 0 0  Moving slowly or fidgety/restless - - - 0 0  Suicidal thoughts - - - 0 0  PHQ-9 Score - - - 6 5  Difficult doing work/chores - - - Somewhat difficult Somewhat difficult  Some recent data might be hidden    Fall Risk  10/18/2019 07/02/2019 05/21/2019 05/10/2019 04/24/2019  Falls in the past year? 1 0 1 0 1  Number falls in past yr: 1 0 1 0 1  Injury with Fall? 0 0 1 0 0  Risk for fall due to : History of fall(s);Impaired mobility - - - -   MMSE - Mini Mental State Exam 12/01/2016  Orientation to time 5  Orientation to Place 5  Registration 3  Attention/ Calculation 5  Recall 0  Language- name 2 objects 2  Language- repeat 1  Language- follow 3 step command 3  Language- read & follow direction 1  Write a sentence 1  Copy design 1  Total score 27     Health Maintenance  Topic Date Due  . OPHTHALMOLOGY EXAM  09/21/2016  . FOOT EXAM  01/28/2019  . HEMOGLOBIN A1C  10/25/2019  . URINE MICROALBUMIN  04/23/2020  . TETANUS/TDAP  08/20/2029  . INFLUENZA VACCINE  Completed  . PNA vac Low Risk Adult  Completed  Urinary incontinence? Functional Status Survey: Is the patient deaf or have difficulty hearing?: No Does the patient have difficulty seeing, even when wearing glasses/contacts?: No Does the patient have difficulty concentrating, remembering, or making decisions?: No Does the patient have difficulty walking or climbing stairs?: (long term resident of SNF) Does the patient have difficulty dressing or bathing?: Yes No exam data present   Past Medical History:  Diagnosis Date  . Acute cholecystitis 11/2012   Drained percutaneously  . Anxiety   . BPH (benign prostatic hypertrophy)   . Deep venous thrombosis (HCC)    Left leg, diagnosed 7/12  . Dementia [facility])    Needs supervision for safety per son - POA  . Diverticulitis   . Essential hypertension   . GERD  (gastroesophageal reflux disease)   . History of pneumonia    Prior to 2012  . Incontinence of urine   . Mixed hyperlipidemia   . Osteoarthritis   . Pulmonary embolism ([place])    Diagnosed 7/12  . Type 2 diabetes mellitus ([place])     Past Surgical History:  Procedure Laterality Date  . CARPAL TUNNEL RELEASE    . COLONOSCOPY  08/31/2012   Procedure: COLONOSCOPY;  Surgeon: Rogene Houston, MD;  Location: [facility];  Service: Endoscopy;  Laterality: N/A;  830  . CYSTOSCOPY WITH INJECTION N/A 05/15/2018   Procedure: CYSTOSCOPY WITH BOTOX INJECTION;  Surgeon: Cleon Gustin, MD;  Location: [facility] ORS;  Service: Urology;  Laterality: N/A;  . CYSTOSCOPY WITH INJECTION N/A 11/10/2018   Procedure: CYSTOSCOPY WITH BOTOX  INJECTION;  Surgeon: Cleon Gustin, MD;  Location: [facility];  Service: Urology;  Laterality: N/A;  30 MINS  . CYSTOSCOPY WITH INSERTION OF UROLIFT N/A 09/02/2016   Procedure: CYSTOSCOPY WITH INSERTION OF UROLIFT;  Surgeon: Cleon Gustin, MD;  Location: [facility];  Service: Urology;  Laterality: N/A;  . EYE SURGERY Bilateral    bilateral cataract  . Right total knee replacement  04/23/2011  . SPINE SURGERY  prior to 2012   Neck fusion  . UMBILICAL HERNIA REPAIR     umbilical    Family History  Problem Relation Age of Onset  . Cancer Mother        Stomach  . Diabetes Sister   . Hypertension Sister   . Diabetes Brother   . Cancer Brother        Gallbladder  . Seizures Son        due to meningitis as a child    Social History   Socioeconomic History  . Marital status: Married    Spouse name: Not on file  . Number of children: Not on file  . Years of education: Not on file  . Highest education level: Not on file  Occupational History  . Occupation: retired   Scientific laboratory technician  . Financial resource strain: Not hard at all  . Food insecurity    Worry: Never true    Inability: Never true  . Transportation needs    Medical: No     Non-medical: No  Tobacco Use  . Smoking status: Never Smoker  . Smokeless tobacco: Never Used  Substance and Sexual Activity  . Alcohol use: No  . Drug use: No  . Sexual activity: Not Currently  Lifestyle  . Physical activity    Days per week: 0 days    Minutes per session: 0 min  . Stress: Not at all  Relationships  . Social connections  Talks on phone: Once a week    Gets together: Never    Attends religious service: Never    Active member of club or organization: No    Attends meetings of clubs or organizations: Not on file    Relationship status: Married  Other Topics Concern  . Not on file  Social History Narrative   Long term resident of SNF     reports that he has never smoked. He has never used smokeless tobacco. He reports that he does not drink alcohol or use drugs.   Allergies  Allergen Reactions  . Zosyn [Piperacillin Sod-Tazobactam So] Anaphylaxis, Swelling and Other (See Comments)    Facial swelling Has patient had a PCN reaction causing immediate rash, facial/tongue/throat swelling, SOB or lightheadedness with hypotension: Yes Has patient had a PCN reaction causing severe rash involving mucus membranes or skin necrosis: No Has patient had a PCN reaction that required hospitalization: No Has patient had a PCN reaction occurring within the last 10 years: Yes If all of the above answers are "NO", then may proceed with Cephalosporin use.   . Ace Inhibitors Cough  . Vancomycin Swelling    Facial swelling    Facility-Administered Encounter Medications as of 10/18/2019  Medication  . botulinum toxin Type A (BOTOX) injection 100 Units   Outpatient Encounter Medications as of 10/18/2019  Medication Sig  . acetaminophen (TYLENOL) 500 MG tablet Take 1,000 mg by mouth every 6 (six) hours as needed for mild pain, moderate pain or headache.   [name] atenolol (TENORMIN) 25 MG tablet Take 25 mg by mouth daily.  Roseanne Kaufman [place]-Castor Oil (VENELEX) OINT Apply 1  application topically 3 (three) times daily. Apply to entire buttocks  . donepezil (ARICEPT) 10 MG tablet TAKE 1 TABLET AT BEDTIME  . Glucerna (GLUCERNA) LIQD Take 237 mLs by mouth daily. Due to poor meal intake  . Insulin Glargine (LANTUS SOLOSTAR) 100 UNIT/ML Solostar Pen Inject 14 Units into the skin every morning. 8:00 am   . insulin lispro (HUMALOG KWIKPEN) 100 UNIT/ML KwikPen Inject 8 Units into the skin 3 (three) times daily with meals.  . Lidocaine 4 % PTCH Instructions: apply 1/2 patch to both knees in the AM and remove at HS for bilateral knee arthritic pain Once A Day  . linagliptin (TRADJENTA) 5 MG TABS tablet Take 5 mg by mouth daily.  [name] loratadine (CLARITIN) 10 MG tablet Take 10 mg by mouth every morning.   . magnesium hydroxide (MILK OF MAGNESIA) 400 MG/5ML suspension Take 30 mLs by mouth daily as needed for mild constipation. For constipation  . NON FORMULARY Diet Type Change: Dysphagia 2, thin liquids (moisten meats with gravy) will allow banana cut into pieces upon request  . omeprazole (PRILOSEC) 40 MG capsule TAKE 1 CAPSULE TWICE DAILY  . OXYGEN Inhale 2 L/min into the lungs continuous. May give oxygen at 2 L/min via nasal cannula, titrating to keep O2 saturation over 90%.  Inform physician ASAP for acute onset dyspnea or O2 saturation < 88%  . sertraline (ZOLOFT) 50 MG tablet Take 1 tablet (50 mg total) by mouth daily.  . tamsulosin (FLOMAX) 0.4 MG CAPS capsule TAKE 1 CAPSULE EVERY DAY     Review of Systems:  Review of Systems  Constitutional: Negative for appetite change and fatigue.  HENT: Negative for congestion.   Respiratory: Negative for cough, chest tightness and shortness of breath.   Cardiovascular: Negative for chest pain, palpitations and leg swelling.  Gastrointestinal: Negative for abdominal pain, constipation,  diarrhea and nausea.  Musculoskeletal: Negative for arthralgias and myalgias.  Skin: Negative.   Neurological: Negative for dizziness.   Psychiatric/Behavioral: The patient is not nervous/anxious.     Physical Exam: Vitals:   10/18/19 1112  BP: 126/70  Pulse: 71  Resp: (!) 24  Temp: 98.1 F (36.7 C)  TempSrc: Oral  SpO2: 96%  Weight: 225 lb 12.8 oz (102.4 kg)  Height: 5\' 10"  (1.778 m)   Body mass index is 32.4 kg/m. Physical Exam Constitutional:      General: He is not in acute distress.    Appearance: He is well-developed. He is not diaphoretic.  Eyes:     Comments: History of bilateral cataract removal    Neck:     Musculoskeletal: Neck supple.     Thyroid: No thyromegaly.  Cardiovascular:     Rate and Rhythm: Normal rate and regular rhythm.     Pulses: Normal pulses.     Heart sounds: Normal heart sounds.  Pulmonary:     Effort: Pulmonary effort is normal. No respiratory distress.     Breath sounds: Normal breath sounds.  Abdominal:     General: Bowel sounds are normal. There is no distension.     Palpations: Abdomen is soft.     Tenderness: There is no abdominal tenderness.  Musculoskeletal:     Right lower leg: Edema present.     Left lower leg: Edema present.     Comments: Chronic 1- 2+ bilateral lower extremity edema Is able to move all extremities  History of right total knee replacement and neck fusion    Is unable to stand at this time   Lymphadenopathy:     Cervical: No cervical adenopathy.  Skin:    General: Skin is warm and dry.  Neurological:     Mental Status: He is alert. Mental status is at baseline.  Psychiatric:        Mood and Affect: Mood normal.     SIGNIFICANT DIAGNOSTIC EXAMS  PREVIOUS;   07-08-19: ct of head an cervical spine:  1. No evidence of significant acute traumatic injury to the skull, brain or cervical spine. 2. Mild cerebral atrophy with extensive chronic microvascular ischemic changes in the cerebral white matter, as above. 3. Status post ACDF at C4-C6 with multilevel degenerative disc disease and cervical spondylosis, as above  07-08-19: chest x-ray:   1. No signs of significant acute traumatic injury to the thorax. 2. The appearance the chest is unusual. The possibility of congestive heart failure is considered, however, clinical correlation for signs and symptoms of severe acute bronchitis is recommended. 3. Aortic atherosclerosis.   07-08-19: bilateral hip x-ray: No acute osseous abnormality.   07-08-19; lumbar spine x-ray: Spondylosis, without acute osseous abnormality. Aortic Atherosclerosis   07-11-19: chest x-ray: Cardiomegaly with mild vascular congestion. No focal consolidation.    08-08-19: chest x-ray: No active cardiopulmonary disease.  NO NEW EXAMS.   LABS REVIEWED PREVIOUS;   04-24-19: hgb a1c 10.0 chol 130; ldl 76; tirg 115; hdl 34; vit D 51 07-08-19: wbc 5.8; hgb 11.3; hct 35.8; mcv 100.0; plt 192; glucose 123; bun 15; creat 1.58; k+ 5.1; na++ 140; ca 8.6 liver normal albumin 3.2; urine culture: no growth 07-10-19: wbc 5.7; hgb 12.6; hct 40.2; mcv 98.8 plt 194; glucose 146; bun 16; creat 1.41; k+ 4.3; na++ 140 ca 8.7 07-11-19: tsh 0.400 ammonia 14 07/07/2019: wbc 4.6; hgb 10.8; hct 34.3; mvc 100.6 plt 191; glucose 137; bun 17; creat 1.52; k+ 4.1; na+  139; ca 8.1  08-08-19: wbc 7.3; hgb 10.4; hct 33.1; mcv 100.3; plt 227; glucose 292; bun 43; creat 2.27; k+ 5.2; na++ 134; ca 8.4   NO NEW LABS     Assessment/Plan  TODAY;   1. Encounter for annual wellness exam in medicare patient 2. Longstanding persistent atrial fibrillation 3. Late onset alzheimer's disease without behavioral disturbance 4. Pulmonary interstitial fibrosis.  His health maintenance is up to date Will continue current medications Will continue current plan of care  Will continue to monitor his status.

## 2019-10-19 ENCOUNTER — Encounter: Payer: Self-pay | Admitting: Adult Health

## 2019-10-19 ENCOUNTER — Non-Acute Institutional Stay (SKILLED_NURSING_FACILITY): Payer: Medicare HMO | Admitting: Adult Health

## 2019-10-19 DIAGNOSIS — Z794 Long term (current) use of insulin: Secondary | ICD-10-CM

## 2019-10-19 DIAGNOSIS — J3089 Other allergic rhinitis: Secondary | ICD-10-CM

## 2019-10-19 DIAGNOSIS — E1165 Type 2 diabetes mellitus with hyperglycemia: Secondary | ICD-10-CM | POA: Diagnosis not present

## 2019-10-19 DIAGNOSIS — E1169 Type 2 diabetes mellitus with other specified complication: Secondary | ICD-10-CM

## 2019-10-19 DIAGNOSIS — E1121 Type 2 diabetes mellitus with diabetic nephropathy: Secondary | ICD-10-CM | POA: Diagnosis not present

## 2019-10-19 DIAGNOSIS — E785 Hyperlipidemia, unspecified: Secondary | ICD-10-CM

## 2019-10-19 DIAGNOSIS — IMO0002 Reserved for concepts with insufficient information to code with codable children: Secondary | ICD-10-CM

## 2019-10-19 NOTE — Progress Notes (Signed)
Location:    [facility] Room Number: 129/P Place of Service:  SNF (31)   CODE STATUS: DNR  Allergies  Allergen Reactions  . Zosyn [Piperacillin Sod-Tazobactam So] Anaphylaxis, Swelling and Other (See Comments)    Facial swelling Has patient had a PCN reaction causing immediate rash, facial/tongue/throat swelling, SOB or lightheadedness with hypotension: Yes Has patient had a PCN reaction causing severe rash involving mucus membranes or skin necrosis: No Has patient had a PCN reaction that required hospitalization: No Has patient had a PCN reaction occurring within the last 10 years: Yes If all of the above answers are "NO", then may proceed with Cephalosporin use.   . Ace Inhibitors Cough  . Vancomycin Swelling    Facial swelling    Chief Complaint  Patient presents with  . Medical Management of Chronic Issues         . Chronic non-seasonal allergic rhinits   Uncontrolled diabetes mellitus with diabetic nephropathy with long term current use of insulin:   Dyslipidemia associated with type 2 diabetes mellitus:    HPI:  He is a 85 year old long term resident of this facility being seen for the management of her chronic illnesses: dyslipidemia diabetes; allergic rhinitis. He denies any cough or shortness of breath. He denies any uncontrolled pain; no changes in appetite   Past Medical History:  Diagnosis Date  . Acute cholecystitis 11/2012   Drained percutaneously  . Anxiety   . BPH (benign prostatic hypertrophy)   . Deep venous thrombosis (HCC)    Left leg, diagnosed 7/12  . Dementia [facility])    Needs supervision for safety per son - POA  . Diverticulitis   . Essential hypertension   . GERD (gastroesophageal reflux disease)   . History of pneumonia    Prior to 2012  . Incontinence of urine   . Mixed hyperlipidemia   . Osteoarthritis   . Pulmonary embolism ([place])    Diagnosed 7/12  . Type 2 diabetes mellitus ([place])     Past Surgical History:   Procedure Laterality Date  . CARPAL TUNNEL RELEASE    . COLONOSCOPY  08/31/2012   Procedure: COLONOSCOPY;  Surgeon: Rogene Houston, MD;  Location: [facility];  Service: Endoscopy;  Laterality: N/A;  830  . CYSTOSCOPY WITH INJECTION N/A 05/15/2018   Procedure: CYSTOSCOPY WITH BOTOX INJECTION;  Surgeon: Cleon Gustin, MD;  Location: [facility] ORS;  Service: Urology;  Laterality: N/A;  . CYSTOSCOPY WITH INJECTION N/A 11/10/2018   Procedure: CYSTOSCOPY WITH BOTOX  INJECTION;  Surgeon: Cleon Gustin, MD;  Location: [facility] ORS;  Service: Urology;  Laterality: N/A;  30 MINS  . CYSTOSCOPY WITH INSERTION OF UROLIFT N/A 09/02/2016   Procedure: CYSTOSCOPY WITH INSERTION OF UROLIFT;  Surgeon: Cleon Gustin, MD;  Location: [facility];  Service: Urology;  Laterality: N/A;  . EYE SURGERY Bilateral    bilateral cataract  . Right total knee replacement  04/23/2011  . SPINE SURGERY  prior to 2012   Neck fusion  . UMBILICAL HERNIA REPAIR     umbilical    Social History   Socioeconomic History  . Marital status: Married    Spouse name: Not on file  . Number of children: Not on file  . Years of education: Not on file  . Highest education level: Not on file  Occupational History  . Occupation: retired   Scientific laboratory technician  . Financial resource strain: Not hard at all  . Food  insecurity    Worry: Never true    Inability: Never true  . Transportation needs    Medical: No    Non-medical: No  Tobacco Use  . Smoking status: Never Smoker  . Smokeless tobacco: Never Used  Substance and Sexual Activity  . Alcohol use: No  . Drug use: No  . Sexual activity: Not Currently  Lifestyle  . Physical activity    Days per week: 0 days    Minutes per session: 0 min  . Stress: Not at all  Relationships  . Social Herbalist on phone: Once a week    Gets together: Never    Attends religious service: Never    Active member of club or organization: No    Attends meetings of clubs  or organizations: Not on file    Relationship status: Married  . Intimate partner violence    Fear of current or ex partner: No    Emotionally abused: No    Physically abused: No    Forced sexual activity: No  Other Topics Concern  . Not on file  Social History Narrative   Long term resident of SNF    Family History  Problem Relation Age of Onset  . Cancer Mother        Stomach  . Diabetes Sister   . Hypertension Sister   . Diabetes Brother   . Cancer Brother        Gallbladder  . Seizures Son        due to meningitis as a child      VITAL SIGNS BP 130/84   Pulse 71   Temp 98 F (36.7 C) (Oral)   Resp (!) 24   Ht 5\' 10"  (1.778 m)   Wt 225 lb 12.8 oz (102.4 kg)   SpO2 96%   BMI 32.40 kg/m   Facility-Administered Encounter Medications as of 10/19/2019  Medication  . botulinum toxin Type A (BOTOX) injection 100 Units   Outpatient Encounter Medications as of 10/19/2019  Medication Sig  . acetaminophen (TYLENOL) 500 MG tablet Take 1,000 mg by mouth every 6 (six) hours as needed for mild pain, moderate pain or headache.   [name] atenolol (TENORMIN) 25 MG tablet Take 25 mg by mouth daily.  Roseanne Kaufman Peru-Castor Oil (VENELEX) OINT Apply 1 application topically 3 (three) times daily. Apply to entire buttocks  . donepezil (ARICEPT) 10 MG tablet TAKE 1 TABLET AT BEDTIME  . Glucerna (GLUCERNA) LIQD Take 237 mLs by mouth daily. Due to poor meal intake  . Insulin Glargine (LANTUS SOLOSTAR) 100 UNIT/ML Solostar Pen Inject 14 Units into the skin every morning. 8:00 am   . insulin lispro (HUMALOG KWIKPEN) 100 UNIT/ML KwikPen Inject 8 Units into the skin 3 (three) times daily with meals.  . Lidocaine 4 % PTCH Instructions: apply 1/2 patch to both knees in the AM and remove at HS for bilateral knee arthritic pain Once A Day  . linagliptin (TRADJENTA) 5 MG TABS tablet Take 5 mg by mouth daily.  [name] loratadine (CLARITIN) 10 MG tablet Take 10 mg by mouth every morning.   . magnesium  hydroxide (MILK OF MAGNESIA) 400 MG/5ML suspension Take 30 mLs by mouth daily as needed for mild constipation. For constipation  . NON FORMULARY Diet Type Change: Dysphagia 2, thin liquids (moisten meats with gravy) will allow banana cut into pieces upon request  . omeprazole (PRILOSEC) 40 MG capsule TAKE 1 CAPSULE TWICE DAILY  . OXYGEN Inhale 2  L/min into the lungs continuous. May give oxygen at 2 L/min via nasal cannula, titrating to keep O2 saturation over 90%.  Inform physician ASAP for acute onset dyspnea or O2 saturation < 88%  . sertraline (ZOLOFT) 50 MG tablet Take 1 tablet (50 mg total) by mouth daily.  . tamsulosin (FLOMAX) 0.4 MG CAPS capsule TAKE 1 CAPSULE EVERY DAY     SIGNIFICANT DIAGNOSTIC EXAMS   PREVIOUS;   07-08-19: ct of head an cervical spine:  1. No evidence of significant acute traumatic injury to the skull, brain or cervical spine. 2. Mild cerebral atrophy with extensive chronic microvascular ischemic changes in the cerebral white matter, as above. 3. Status post ACDF at C4-C6 with multilevel degenerative disc disease and cervical spondylosis, as above  07-08-19: chest x-ray:  1. No signs of significant acute traumatic injury to the thorax. 2. The appearance the chest is unusual. The possibility of congestive heart failure is considered, however, clinical correlation for signs and symptoms of severe acute bronchitis is recommended. 3. Aortic atherosclerosis.   07-08-19: bilateral hip x-ray: No acute osseous abnormality.   07-08-19; lumbar spine x-ray: Spondylosis, without acute osseous abnormality. Aortic Atherosclerosis   07-11-19: chest x-ray: Cardiomegaly with mild vascular congestion. No focal consolidation.    08-08-19: chest x-ray: No active cardiopulmonary disease.  NO NEW EXAMS.   LABS REVIEWED PREVIOUS;   04-24-19: hgb a1c 10.0 chol 130; ldl 76; tirg 115; hdl 34; vit D 51 07-08-19: wbc 5.8; hgb 11.3; hct 35.8; mcv 100.0; plt 192; glucose 123; bun 15; creat 1.58;  k+ 5.1; na++ 140; ca 8.6 liver normal albumin 3.2; urine culture: no growth 07-10-19: wbc 5.7; hgb 12.6; hct 40.2; mcv 98.8 plt 194; glucose 146; bun 16; creat 1.41; k+ 4.3; na++ 140 ca 8.7 07-11-19: tsh 0.400 ammonia 14 07/29/2019: wbc 4.6; hgb 10.8; hct 34.3; mvc 100.6 plt 191; glucose 137; bun 17; creat 1.52; k+ 4.1; na+ 139; ca 8.1  08-08-19: wbc 7.3; hgb 10.4; hct 33.1; mcv 100.3; plt 227; glucose 292; bun 43; creat 2.27; k+ 5.2; na++ 134; ca 8.4   NO NEW LABS  Review of Systems  Constitutional: Negative for malaise/fatigue.  Respiratory: Negative for cough and shortness of breath.   Cardiovascular: Negative for chest pain, palpitations and leg swelling.  Gastrointestinal: Negative for abdominal pain, constipation and heartburn.  Musculoskeletal: Negative for back pain, joint pain and myalgias.  Skin: Negative.   Neurological: Negative for dizziness.  Psychiatric/Behavioral: The patient is not nervous/anxious.     Physical Exam Constitutional:      General: He is not in acute distress.    Appearance: He is well-developed. He is not diaphoretic.  Eyes:     Comments: History of bilateral cataract removal     Neck:     Musculoskeletal: Neck supple.     Thyroid: No thyromegaly.  Cardiovascular:     Rate and Rhythm: Normal rate and regular rhythm.     Pulses: Normal pulses.     Heart sounds: Normal heart sounds.  Pulmonary:     Effort: Pulmonary effort is normal. No respiratory distress.     Breath sounds: Normal breath sounds.  Abdominal:     General: Bowel sounds are normal. There is no distension.     Palpations: Abdomen is soft.     Tenderness: There is no abdominal tenderness.  Musculoskeletal:     Right lower leg: Edema present.     Left lower leg: Edema present.     Comments: Chronic 1-  2+ bilateral lower extremity edema Is able to move all extremities  History of right total knee replacement and neck fusion    Is unable to stand at this time    Lymphadenopathy:      Cervical: No cervical adenopathy.  Skin:    General: Skin is warm and dry.  Neurological:     Mental Status: He is alert. Mental status is at baseline.  Psychiatric:        Mood and Affect: Mood normal.     ASSESSMENT/ PLAN:  TODAY:   1. Chronic non-seasonal allergic rhinits stable will continue claritin 10 mg dail   2. Uncontrolled diabetes mellitus with diabetic nephropathy with long term current use of insulin: is without change hgb a1c 10.0 will continue lantus 14 units nightly humalog 8 units with meals tradjenta 5 mg daily will monitor   3. Dyslipidemia associated with type 2 diabetes mellitus: is stable LDL 76 is presently off medications; will monitor   PREVIOUS  4. GERD without esophagitis: is stable will continue prilosec 40 mg daily twice daily    5. Late onset alzheimer's disease without behavioral disturbance; is without change weight is 232 pounds; will continue aricept 10 mg daily   6.  Generalized osteoarthritis: is stable   Will continue lidoderm patch 1/2 to each knee daily   7. CKD stage 3 due to diabetes mellitus type 2: is stable bun 17 creat 1.52 will monitor  8.  Chronic anemia: is stable hgb 10.4 will monitor iron has been stopped   9. Urinary frequency/benign prostatic hyperplasia with urinary retention: is stable will continue flomax 0.4 mg daily   10.  Major depression recurrent chronic: is stable will continue zoloft 50 mg daily  11. Vit D deficiency: is stable vit D 51 will continue to monitor vit D has been stopped.   12. Long standing persistent atrial fibrillation: heart rate is stable tenormin 25 mg nightly for rate control he is not a candidate for anticoagulation  13. Hypertension associated with stage 3 chronic kidney disease due to  type 2 diabetes mellitus: is stable b/p 130/84 will continue tenormin 25 mg daily   14. Pulmonary interstitial fibrosis: is without change 02 dependent     MD is aware of resident's narcotic use and is in  agreement with current plan of care. We will attempt to wean resident as appropriate.  Ok Edwards NP [facility]  Contact [phone] Monday through Friday 8am- 5pm  After hours call [phone]

## 2019-10-31 ENCOUNTER — Encounter: Payer: Self-pay | Admitting: Internal Medicine

## 2019-10-31 ENCOUNTER — Non-Acute Institutional Stay (SKILLED_NURSING_FACILITY): Payer: Medicare HMO | Admitting: Internal Medicine

## 2019-10-31 DIAGNOSIS — Z20828 Contact with and (suspected) exposure to other viral communicable diseases: Secondary | ICD-10-CM | POA: Diagnosis not present

## 2019-10-31 DIAGNOSIS — R05 Cough: Secondary | ICD-10-CM | POA: Diagnosis not present

## 2019-10-31 DIAGNOSIS — U071 COVID-19: Secondary | ICD-10-CM | POA: Diagnosis not present

## 2019-10-31 NOTE — Progress Notes (Signed)
Location:   [employer] of Service:   SNF  Hennie Duos, MD  Patient Care Team: Hennie Duos, MD as PCP - General (Internal Medicine) Satira Sark, MD as PCP - Cardiology (Cardiology) Alyson Ingles [name], MD as Consulting Physician (Urology) Nyoka Cowden Phylis Bougie, NP as Nurse Practitioner ([place]) [facility] [place] ([place])  Extended Emergency Contact Information Primary Emergency Contact: [facility] Address: [place], [place] [address] [place] Phone: [phone] Relation: Son    Allergies: Zosyn [piperacillin sod-tazobactam so], Ace inhibitors, and Vancomycin  Chief Complaint  Patient presents with  . Acute Visit    HPI: Patient is 85 y.o. male who is being seen for being Covid positive.  The patient ran a temperature 100.1 today and was tested and he was Covid positive per nursing patient has been maybe a little more lethargic than he has prior but he has not had a cough and he has not had a fever since the first 1.  Past Medical History:  Diagnosis Date  . Acute cholecystitis 11/2012   Drained percutaneously  . Anxiety   . BPH (benign prostatic hypertrophy)   . Deep venous thrombosis (HCC)    Left leg, diagnosed 7/12  . Dementia [facility])    Needs supervision for safety per son - POA  . Diverticulitis   . Essential hypertension   . GERD (gastroesophageal reflux disease)   . History of pneumonia    Prior to 2012  . Incontinence of urine   . Mixed hyperlipidemia   . Osteoarthritis   . Pulmonary embolism ([place])    Diagnosed 7/12  . Type 2 diabetes mellitus ([place])     Past Surgical History:  Procedure Laterality Date  . CARPAL TUNNEL RELEASE    . COLONOSCOPY  08/31/2012   Procedure: COLONOSCOPY;  Surgeon: Rogene Houston, MD;  [facility];  Service: Endoscopy;  Laterality: N/A;  830  . CYSTOSCOPY WITH INJECTION N/A 05/15/2018   Procedure: CYSTOSCOPY  WITH BOTOX INJECTION;  Surgeon: Cleon Gustin, MD;  Location: [facility] ORS;  Service: Urology;  Laterality: N/A;  . CYSTOSCOPY WITH INJECTION N/A 11/10/2018   Procedure: CYSTOSCOPY WITH BOTOX  INJECTION;  Surgeon: Cleon Gustin, MD;  Location: [facility] ORS;  Service: Urology;  Laterality: N/A;  30 MINS  . CYSTOSCOPY WITH INSERTION OF UROLIFT N/A 09/02/2016   Procedure: CYSTOSCOPY WITH INSERTION OF UROLIFT;  Surgeon: Cleon Gustin, MD;  Location: [facility];  Service: Urology;  Laterality: N/A;  . EYE SURGERY Bilateral    bilateral cataract  . Right total knee replacement  04/23/2011  . SPINE SURGERY  prior to 2012   Neck fusion  . UMBILICAL HERNIA REPAIR     umbilical    Allergies as of 10/31/2019      Reactions   Zosyn [piperacillin Sod-tazobactam So] Anaphylaxis, Swelling, Other (See Comments)   Facial swelling Has patient had a PCN reaction causing immediate rash, facial/tongue/throat swelling, SOB or lightheadedness with hypotension: Yes Has patient had a PCN reaction causing severe rash involving mucus membranes or skin necrosis: No Has patient had a PCN reaction that required hospitalization: No Has patient had a PCN reaction occurring within the last 10 years: Yes If all of the above answers are "NO", then may proceed with Cephalosporin use.   Ace Inhibitors Cough   Vancomycin Swelling   Facial swelling  Medication List    Notice   This visit is during an admission. Changes to the med list made in this visit will be reflected in the After Visit Summary of the admission.    Current Facility-Administered Medications on File Prior to Visit  Medication Dose Route Frequency Provider Last Rate Last Dose  . botulinum toxin Type A (BOTOX) injection 100 Units  100 Units Intramuscular Once McKenzie, [name], MD       Current Outpatient Medications on File Prior to Visit  Medication Sig Dispense Refill  . acetaminophen (TYLENOL) 500 MG tablet Take 1,000 mg  by mouth every 6 (six) hours as needed for mild pain, moderate pain or headache.     [name] atenolol (TENORMIN) 25 MG tablet Take 25 mg by mouth daily.    Roseanne Kaufman Peru-Castor Oil (VENELEX) OINT Apply 1 application topically 3 (three) times daily. Apply to entire buttocks    . donepezil (ARICEPT) 10 MG tablet TAKE 1 TABLET AT BEDTIME 90 tablet 1  . Glucerna (GLUCERNA) LIQD Take 237 mLs by mouth daily. Due to poor meal intake    . Insulin Glargine (LANTUS SOLOSTAR) 100 UNIT/ML Solostar Pen Inject 14 Units into the skin every morning. 8:00 am     . insulin lispro (HUMALOG KWIKPEN) 100 UNIT/ML KwikPen Inject 8 Units into the skin 3 (three) times daily with meals.    . Lidocaine 4 % PTCH Instructions: apply 1/2 patch to both knees in the AM and remove at HS for bilateral knee arthritic pain Once A Day    . linagliptin (TRADJENTA) 5 MG TABS tablet Take 5 mg by mouth daily.    [name] loratadine (CLARITIN) 10 MG tablet Take 10 mg by mouth every morning.     . magnesium hydroxide (MILK OF MAGNESIA) 400 MG/5ML suspension Take 30 mLs by mouth daily as needed for mild constipation. For constipation    . NON FORMULARY Diet Type Change: Dysphagia 2, thin liquids (moisten meats with gravy) will allow banana cut into pieces upon request    . omeprazole (PRILOSEC) 40 MG capsule TAKE 1 CAPSULE TWICE DAILY 180 capsule 0  . OXYGEN Inhale 2 L/min into the lungs continuous. May give oxygen at 2 L/min via nasal cannula, titrating to keep O2 saturation over 90%.  Inform physician ASAP for acute onset dyspnea or O2 saturation < 88%    . sertraline (ZOLOFT) 50 MG tablet Take 1 tablet (50 mg total) by mouth daily. 90 tablet 3  . tamsulosin (FLOMAX) 0.4 MG CAPS capsule TAKE 1 CAPSULE EVERY DAY 90 capsule 0     No orders of the defined types were placed in this encounter.   Immunization History  Administered Date(s) Administered  . Influenza Split 10/14/2011, 09/29/2013, 09/03/2019  . Influenza Whole 09/07/2007, 09/04/2009,  08/04/2010  . Influenza,inj,Quad PF,6+ Mos 09/09/2014, 08/13/2016, 10/05/2017, 08/28/2018  . Pneumococcal Conjugate-13 07/16/2014  . Pneumococcal Polysaccharide-23 08/04/2010  . Td 07/28/2004, 08/21/2019  . Zoster 03/08/2007    Social History   Tobacco Use  . Smoking status: Never Smoker  . Smokeless tobacco: Never Used  Substance Use Topics  . Alcohol use: No    Review of Systems  GENERAL: Low-grade fever, some fatigue, no appetite changes SKIN: No itching, rash HEENT: No complaint RESPIRATORY: No cough, wheezing, SOB CARDIAC: No chest pain, palpitations, lower extremity edema  GI: No abdominal pain, No N/V/D or constipation, No heartburn or reflux  GU: No dysuria, frequency or urgency, or incontinence  MUSCULOSKELETAL: No unrelieved bone/joint pain  NEUROLOGIC: No headache, dizziness  PSYCHIATRIC: No overt anxiety or sadness  Vitals:   10/31/19 1334  BP: 110/80  Pulse: (!) 115  Resp: 18  Temp: 100.1 F (37.8 C)   There is no height or weight on file to calculate BMI. Physical Exam  GENERAL APPEARANCE: Alert, No acute distress  SKIN: No diaphoresis rash HEENT: Unremarkable RESPIRATORY: Breathing is even, unlabored. Lung sounds are clear, O2 sat on 3 L nasal cannula is 96% bedside pulse oximetry, chronic CARDIOVASCULAR: Heart RRR no murmurs, rubs or gallops. No peripheral edema  GASTROINTESTINAL: Abdomen is soft, non-tender, not distended w/ normal bowel sounds.  GENITOURINARY: Bladder non tender, not distended  MUSCULOSKELETAL: No abnormal joints or musculature NEUROLOGIC: Cranial nerves 2-12 grossly intact. Moves all extremities PSYCHIATRIC: Mood and affect appropriate to situation, no behavioral issues  Patient Active Problem List   Diagnosis Date Noted  . Failure to thrive in adult 08/15/2019  . Acute renal failure superimposed on stage 3 chronic kidney disease ([place]) 08/11/2019  . Oral thrush 08/04/2019  . Physical deconditioning 08/03/2019  . Hypertension  associated with stage 3 chronic kidney disease due to type 2 diabetes mellitus ([place]) 07/16/2019  . Dyslipidemia associated with type 2 diabetes mellitus ([place]) 07/16/2019  . GERD without esophagitis 07/16/2019  . CKD stage 3 due to type 2 diabetes mellitus ([place]) 07/16/2019  . Major depression, recurrent, chronic ([place]) 07/16/2019  . Falls 07/08/2019  . Encounter for annual wellness exam in Medicare patient 11/28/2018  . Knee pain, left 10/10/2018  . History of total knee arthroplasty, right 10/10/2018  . Unilateral primary osteoarthritis, left knee 10/10/2018  . Paroxysmal SVT (supraventricular tachycardia) ([place]) 09/25/2018  . Uncontrolled type 2 diabetes mellitus with hyperglycemia ([place]) 09/22/2018  . Dementia without behavioral disturbance ([place]) 09/22/2018  . Morbid obesity ([place]) 06/12/2018  . Chronic anemia 09/14/2017  . CKD (chronic kidney disease) stage 3, GFR 30-59 ml/min 09/14/2017  . Atrial fibrillation ([place]) 09/14/2017  . Hypoalbuminemia 11/23/2016  . Hypoxia 10/29/2016  . Urinary frequency 09/17/2015  . Generalized osteoarthritis 07/16/2014  . Urinary incontinence 07/16/2014  . Pulmonary interstitial fibrosis ([place]) 07/03/2014  . Thyroid mass of unclear etiology 05/07/2014  . Vitamin D deficiency 01/31/2014  . Chronic non-seasonal allergic rhinitis 01/31/2014  . Diverticulosis of sigmoid colon 12/13/2012  . Alzheimer's dementia without behavioral disturbance ([place]) 09/12/2010  . Goiter 06/19/2009  . Essential hypertension 12/11/2008  . Uncontrolled diabetes mellitus with diabetic nephropathy, with long-term current use of insulin ([place]) 09/05/2008  . Hyperlipidemia with target LDL less than 100 07/19/2008  . BPH (benign prostatic hyperplasia) 03/28/2008    CMP     Component Value Date/Time   NA 137 11/01/2019 0819   K 4.9 11/01/2019 0819   CL 103 11/01/2019 0819   CO2 25 11/01/2019 0819   GLUCOSE 125 (H) 11/01/2019 0819   BUN 30 (H) 11/01/2019 0819   CREATININE 1.59  (H) 11/01/2019 0819   CREATININE 1.61 (H) 04/24/2019 0928   CALCIUM 8.8 (L) 11/01/2019 0819   PROT 7.8 07/08/2019 1039   ALBUMIN 3.2 (L) 07/08/2019 1039   AST 25 07/08/2019 1039   ALT 14 07/08/2019 1039   ALKPHOS 65 07/08/2019 1039   BILITOT 1.0 07/08/2019 1039   GFRNONAA 39 (L) 11/01/2019 0819   GFRNONAA 38 (L) 04/24/2019 0928   GFRAA 45 (L) 11/01/2019 0819   GFRAA 44 (L) 04/24/2019 0928   Recent Labs    07/28/2019 0607 08/08/19 1315 11/01/19 0819  NA 139 134* 137  K 4.1 5.2*  4.9  CL 106 104 103  CO2 26 23 25   GLUCOSE 137* 292* 125*  BUN 17 43* 30*  CREATININE 1.52* 2.27* 1.59*  CALCIUM 8.1* 8.4* 8.8*   Recent Labs    11/21/18 0036 04/24/19 0928 05/22/19 0151 07/08/19 1039  AST 22 22 28 25   ALT 17 12 16 14   ALKPHOS 68  --  60 65  BILITOT 0.8 0.9 0.7 1.0  PROT 7.3 7.2 6.7 7.8  ALBUMIN 3.1*  --  2.9* 3.2*   Recent Labs    07/08/19 1039  07/26/2019 0607 08/08/19 1315 11/01/19 0819  WBC 5.8   < > 4.6 7.3 6.2  NEUTROABS 3.5  --   --  5.3 3.1  HGB 11.3*   < > 10.8* 10.4* 10.5*  HCT 35.8*   < > 34.3* 33.1* 34.3*  MCV 100.0   < > 100.6* 100.3* 99.4  PLT 192   < > 191 227 267   < > = values in this interval not displayed.   Recent Labs    04/24/19 0928  CHOL 130  LDLCALC 76  TRIG 115   Lab Results  Component Value Date   MICROALBUR 1.3 04/24/2019   Lab Results  Component Value Date   TSH 0.400 07/11/2019   Lab Results  Component Value Date   HGBA1C 10.0 (H) 04/24/2019   Lab Results  Component Value Date   CHOL 130 04/24/2019   HDL 34 (L) 04/24/2019   LDLCALC 76 04/24/2019   TRIG 115 04/24/2019   CHOLHDL 3.8 04/24/2019    Significant Diagnostic Results in last 30 days:  No results found.  Assessment and Plan  COVID-19 positive-mildly symptomatic; chest x-ray is negative; have ordered ferritin CBC CMP and D-dimer; also have ordered Covid cocktail zinc sulfate 220 mg daily for 14 days, vitamin C 1000 mg daily for 14 days and vitamin D 50,000  units weekly for 2 weeks.  Patient will be watched closely for the development of any symptoms  Full PPE was required for this visit    Hennie Duos, MD

## 2019-11-01 ENCOUNTER — Encounter: Payer: Self-pay | Admitting: Adult Health

## 2019-11-01 ENCOUNTER — Encounter (HOSPITAL_COMMUNITY)
Admission: RE | Admit: 2019-11-01 | Discharge: 2019-11-01 | Disposition: A | Discharge status: Home / Self Care | Payer: Medicare HMO | Source: Skilled Nursing Facility | Attending: Internal Medicine | Admitting: Internal Medicine

## 2019-11-01 ENCOUNTER — Non-Acute Institutional Stay (SKILLED_NURSING_FACILITY): Payer: Medicare HMO | Admitting: Adult Health

## 2019-11-01 DIAGNOSIS — D6869 Other thrombophilia: Secondary | ICD-10-CM

## 2019-11-01 DIAGNOSIS — U071 COVID-19: Secondary | ICD-10-CM | POA: Insufficient documentation

## 2019-11-01 LAB — BASIC METABOLIC PANEL
Anion gap: 9 (ref 5–15)
BUN: 30 mg/dL — ABNORMAL HIGH (ref 8–23)
CO2: 25 mmol/L (ref 22–32)
Calcium: 8.8 mg/dL — ABNORMAL LOW (ref 8.9–10.3)
Chloride: 103 mmol/L (ref 98–111)
Creatinine, Ser: 1.59 mg/dL — ABNORMAL HIGH (ref 0.61–1.24)
GFR calc Af Amer: 45 mL/min — ABNORMAL LOW (ref >60)
GFR calc non Af Amer: 39 mL/min — ABNORMAL LOW (ref >60)
Glucose, Bld: 125 mg/dL — ABNORMAL HIGH (ref 70–99)
Potassium: 4.9 mmol/L (ref 3.5–5.1)
Sodium: 137 mmol/L (ref 135–145)

## 2019-11-01 LAB — CBC WITH DIFFERENTIAL/PLATELET
Abs Immature Granulocytes: 0.02 10*3/uL (ref 0.00–0.07)
Basophils Absolute: 0 10*3/uL (ref 0.0–0.1)
Basophils Relative: 0 %
Eosinophils Absolute: 0.1 10*3/uL (ref 0.0–0.5)
Eosinophils Relative: 1 %
HCT: 34.3 % — ABNORMAL LOW (ref 39.0–52.0)
Hemoglobin: 10.5 g/dL — ABNORMAL LOW (ref 13.0–17.0)
Immature Granulocytes: 0 %
Lymphocytes Relative: 35 %
Lymphs Abs: 2.2 10*3/uL (ref 0.7–4.0)
MCH: 30.4 pg (ref 26.0–34.0)
MCHC: 30.6 g/dL (ref 30.0–36.0)
MCV: 99.4 fL (ref 80.0–100.0)
Monocytes Absolute: 0.9 10*3/uL (ref 0.1–1.0)
Monocytes Relative: 14 %
Neutro Abs: 3.1 10*3/uL (ref 1.7–7.7)
Neutrophils Relative %: 50 %
Platelets: 267 10*3/uL (ref 150–400)
RBC: 3.45 MIL/uL — ABNORMAL LOW (ref 4.22–5.81)
RDW: 15.1 % (ref 11.5–15.5)
WBC: 6.2 10*3/uL (ref 4.0–10.5)
nRBC: 0 % (ref 0.0–0.2)

## 2019-11-01 LAB — D-DIMER, QUANTITATIVE: D-Dimer, Quant: >20 ug/mL-FEU — ABNORMAL HIGH (ref 0.00–0.50)

## 2019-11-01 LAB — FERRITIN: Ferritin: 1121 ng/mL — ABNORMAL HIGH (ref 24–336)

## 2019-11-01 NOTE — Progress Notes (Signed)
Location:    [facility] Room Number: 148/W Place of Service:  SNF (31)   CODE STATUS: DNR  Allergies  Allergen Reactions  . Zosyn [Piperacillin Sod-Tazobactam So] Anaphylaxis, Swelling and Other (See Comments)    Facial swelling Has patient had a PCN reaction causing immediate rash, facial/tongue/throat swelling, SOB or lightheadedness with hypotension: Yes Has patient had a PCN reaction causing severe rash involving mucus membranes or skin necrosis: No Has patient had a PCN reaction that required hospitalization: No Has patient had a PCN reaction occurring within the last 10 years: Yes If all of the above answers are "NO", then may proceed with Cephalosporin use.   . Ace Inhibitors Cough  . Vancomycin Swelling    Facial swelling    Chief Complaint  Patient presents with  . Acute Visit    Elevated d-dimer    HPI:  He is positive for COVID 19. His d-dimer is >20. He is on chronic 02. There are no reports of cough; shortness of breath. No reports of fevers. I have spoken with Dr. Luan Pulling regarding coagulopathy. He is in agreement with starting him on eliquis.    Past Medical History:  Diagnosis Date  . Acute cholecystitis 11/2012   Drained percutaneously  . Anxiety   . BPH (benign prostatic hypertrophy)   . Deep venous thrombosis (HCC)    Left leg, diagnosed 7/12  . Dementia [facility])    Needs supervision for safety per son - POA  . Diverticulitis   . Essential hypertension   . GERD (gastroesophageal reflux disease)   . History of pneumonia    Prior to 2012  . Incontinence of urine   . Mixed hyperlipidemia   . Osteoarthritis   . Pulmonary embolism ([place])    Diagnosed 7/12  . Type 2 diabetes mellitus ([place])     Past Surgical History:  Procedure Laterality Date  . CARPAL TUNNEL RELEASE    . COLONOSCOPY  08/31/2012   Procedure: COLONOSCOPY;  Surgeon: Rogene Houston, MD;  Location: [facility];  Service: Endoscopy;  Laterality: N/A;  830  .  CYSTOSCOPY WITH INJECTION N/A 05/15/2018   Procedure: CYSTOSCOPY WITH BOTOX INJECTION;  Surgeon: Cleon Gustin, MD;  Location: [facility] ORS;  Service: Urology;  Laterality: N/A;  . CYSTOSCOPY WITH INJECTION N/A 11/10/2018   Procedure: CYSTOSCOPY WITH BOTOX  INJECTION;  Surgeon: Cleon Gustin, MD;  Location: [facility] ORS;  Service: Urology;  Laterality: N/A;  30 MINS  . CYSTOSCOPY WITH INSERTION OF UROLIFT N/A 09/02/2016   Procedure: CYSTOSCOPY WITH INSERTION OF UROLIFT;  Surgeon: Cleon Gustin, MD;  Location: [facility];  Service: Urology;  Laterality: N/A;  . EYE SURGERY Bilateral    bilateral cataract  . Right total knee replacement  04/23/2011  . SPINE SURGERY  prior to 2012   Neck fusion  . UMBILICAL HERNIA REPAIR     umbilical    Social History   Socioeconomic History  . Marital status: Married    Spouse name: Not on file  . Number of children: Not on file  . Years of education: Not on file  . Highest education level: Not on file  Occupational History  . Occupation: retired   Scientific laboratory technician  . Financial resource strain: Not hard at all  . Food insecurity    Worry: Never true    Inability: Never true  . Transportation needs    Medical: No    Non-medical: No  Tobacco Use  .  Smoking status: Never Smoker  . Smokeless tobacco: Never Used  Substance and Sexual Activity  . Alcohol use: No  . Drug use: No  . Sexual activity: Not Currently  Lifestyle  . Physical activity    Days per week: 0 days    Minutes per session: 0 min  . Stress: Not at all  Relationships  . Social Herbalist on phone: Once a week    Gets together: Never    Attends religious service: Never    Active member of club or organization: No    Attends meetings of clubs or organizations: Not on file    Relationship status: Married  . Intimate partner violence    Fear of current or ex partner: No    Emotionally abused: No    Physically abused: No    Forced sexual activity:  No  Other Topics Concern  . Not on file  Social History Narrative   Long term resident of SNF    Family History  Problem Relation Age of Onset  . Cancer Mother        Stomach  . Diabetes Sister   . Hypertension Sister   . Diabetes Brother   . Cancer Brother        Gallbladder  . Seizures Son        due to meningitis as a child      VITAL SIGNS BP 132/78   Pulse 96   Temp 98.9 F (37.2 C) (Oral)   Resp 20   Ht 5\' 10"  (1.778 m)   Wt 220 lb 8 oz (100 kg)   SpO2 98%   BMI 31.64 kg/m   Facility-Administered Encounter Medications as of 11/01/2019  Medication  . botulinum toxin Type A (BOTOX) injection 100 Units   Outpatient Encounter Medications as of 11/01/2019  Medication Sig  . acetaminophen (TYLENOL) 500 MG tablet Take 1,000 mg by mouth every 6 (six) hours as needed for mild pain, moderate pain or headache.   [name] apixaban (ELIQUIS) 5 MG TABS tablet Take 5 mg by mouth 2 (two) times daily.  . Ascorbic Acid 500 MG CAPS Take 1,000 mg by mouth daily.  [name] atenolol (TENORMIN) 25 MG tablet Take 25 mg by mouth daily.  Roseanne Kaufman Peru-Castor Oil (VENELEX) OINT Apply 1 application topically 3 (three) times daily. Apply to entire buttocks  . [START ON 11/07/2019] Cholecalciferol 1.25 MG (50000 UT) TABS Take 50,000 Units by mouth. Once- one time  . donepezil (ARICEPT) 10 MG tablet TAKE 1 TABLET AT BEDTIME  . Glucerna (GLUCERNA) LIQD Take 237 mLs by mouth daily. Due to poor meal intake  . Insulin Glargine (LANTUS SOLOSTAR) 100 UNIT/ML Solostar Pen Inject 14 Units into the skin every morning. 8:00 am   . insulin lispro (HUMALOG KWIKPEN) 100 UNIT/ML KwikPen Inject 8 Units into the skin 3 (three) times daily with meals.  . Lidocaine 4 % [facility] Instructions: apply 1/2 patch to both knees in the AM and remove at HS for bilateral knee arthritic pain Once A Day  . linagliptin (TRADJENTA) 5 MG TABS tablet Take 5 mg by mouth daily.  [name] loratadine (CLARITIN) 10 MG tablet Take 10 mg by mouth every  morning.   . magnesium hydroxide (MILK OF MAGNESIA) 400 MG/5ML suspension Take 30 mLs by mouth daily as needed for mild constipation. For constipation  . NON FORMULARY Diet Type Change: Dysphagia 2, thin liquids (moisten meats with gravy) will allow banana cut into pieces upon request  .  omeprazole (PRILOSEC) 40 MG capsule TAKE 1 CAPSULE TWICE DAILY  . OXYGEN Inhale 2 L/min into the lungs continuous. May give oxygen at 2 L/min via nasal cannula, titrating to keep O2 saturation over 90%.  Inform physician ASAP for acute onset dyspnea or O2 saturation < 88%  . sertraline (ZOLOFT) 50 MG tablet Take 1 tablet (50 mg total) by mouth daily.  . tamsulosin (FLOMAX) 0.4 MG CAPS capsule TAKE 1 CAPSULE EVERY DAY  . zinc sulfate 220 (50 Zn) MG capsule Take 220 mg by mouth daily.     SIGNIFICANT DIAGNOSTIC EXAMS   PREVIOUS;   07-08-19: ct of head an cervical spine:  1. No evidence of significant acute traumatic injury to the skull, brain or cervical spine. 2. Mild cerebral atrophy with extensive chronic microvascular ischemic changes in the cerebral white matter, as above. 3. Status post ACDF at C4-C6 with multilevel degenerative disc disease and cervical spondylosis, as above  07-08-19: chest x-ray:  1. No signs of significant acute traumatic injury to the thorax. 2. The appearance the chest is unusual. The possibility of congestive heart failure is considered, however, clinical correlation for signs and symptoms of severe acute bronchitis is recommended. 3. Aortic atherosclerosis.   07-08-19: bilateral hip x-ray: No acute osseous abnormality.   07-08-19; lumbar spine x-ray: Spondylosis, without acute osseous abnormality. Aortic Atherosclerosis   07-11-19: chest x-ray: Cardiomegaly with mild vascular congestion. No focal consolidation.    08-08-19: chest x-ray: No active cardiopulmonary disease.  NO NEW EXAMS.   LABS REVIEWED PREVIOUS;   04-24-19: hgb a1c 10.0 chol 130; ldl 76; tirg 115; hdl 34; vit D  51 07-08-19: wbc 5.8; hgb 11.3; hct 35.8; mcv 100.0; plt 192; glucose 123; bun 15; creat 1.58; k+ 5.1; na++ 140; ca 8.6 liver normal albumin 3.2; urine culture: no growth 07-10-19: wbc 5.7; hgb 12.6; hct 40.2; mcv 98.8 plt 194; glucose 146; bun 16; creat 1.41; k+ 4.3; na++ 140 ca 8.7 07-11-19: tsh 0.400 ammonia 14 07/08/2019: wbc 4.6; hgb 10.8; hct 34.3; mvc 100.6 plt 191; glucose 137; bun 17; creat 1.52; k+ 4.1; na+ 139; ca 8.1  08-08-19: wbc 7.3; hgb 10.4; hct 33.1; mcv 100.3; plt 227; glucose 292; bun 43; creat 2.27; k+ 5.2; na++ 134; ca 8.4   TODAY;   11-01-19: wbc 6.2; hgb 10.5; hct 34.;3 mcv 99.4 plt 267; glucose 125; bun 30; creat 1.59; k+ 4.9; na++ 137; ca 8.8; d-dimer >20.00 ferritin 1121.0   Review of Systems  Constitutional: Negative for malaise/fatigue.  Respiratory: Negative for cough and shortness of breath.   Cardiovascular: Negative for chest pain, palpitations and leg swelling.  Gastrointestinal: Negative for abdominal pain, constipation and heartburn.  Musculoskeletal: Negative for back pain, joint pain and myalgias.  Skin: Negative.   Neurological: Negative for dizziness.  Psychiatric/Behavioral: The patient is not nervous/anxious.       Physical Exam Constitutional:      General: He is not in acute distress.    Appearance: He is well-developed. He is not diaphoretic.  Neck:     Musculoskeletal: Neck supple.     Thyroid: No thyromegaly.  Cardiovascular:     Rate and Rhythm: Normal rate and regular rhythm.     Pulses: Normal pulses.     Heart sounds: Normal heart sounds.  Pulmonary:     Effort: Pulmonary effort is normal. No respiratory distress.     Breath sounds: Normal breath sounds.     Comments: 02 dependent  Abdominal:     General: Bowel  sounds are normal. There is no distension.     Palpations: Abdomen is soft.     Tenderness: There is no abdominal tenderness.  Musculoskeletal:     Right lower leg: Edema present.     Left lower leg: Edema present.      Comments: Chronic 1- 2+ bilateral lower extremity edema Is able to move all extremities  History of right total knee replacement and neck fusion    Is unable to stand at this time     Lymphadenopathy:     Cervical: No cervical adenopathy.  Skin:    General: Skin is warm and dry.  Neurological:     Mental Status: He is alert. Mental status is at baseline.  Psychiatric:        Mood and Affect: Mood normal.     ASSESSMENT/ PLAN:  TODAY;   1. COVID 19 infection 2. Hypercoagulable state associated with COVID 19  Will begin him on eliquis 5 mg twice daily  Will monitor his d-dimer  Will monitor his status.   MD is aware of resident's narcotic use and is in agreement with current plan of care. We will attempt to wean resident as appropriate.  Ok Edwards NP [facility]  Contact [phone] Monday through Friday 8am- 5pm  After hours call [phone]

## 2019-11-04 ENCOUNTER — Encounter: Payer: Self-pay | Admitting: Internal Medicine

## 2019-11-04 DIAGNOSIS — U071 COVID-19: Secondary | ICD-10-CM | POA: Insufficient documentation

## 2019-11-05 ENCOUNTER — Encounter (HOSPITAL_COMMUNITY)
Admission: RE | Admit: 2019-11-05 | Discharge: 2019-11-05 | Disposition: A | Discharge status: Home / Self Care | Payer: Medicare HMO | Source: Skilled Nursing Facility | Attending: Adult Health | Admitting: Adult Health

## 2019-11-05 ENCOUNTER — Encounter: Payer: Self-pay | Admitting: Adult Health

## 2019-11-05 ENCOUNTER — Non-Acute Institutional Stay (SKILLED_NURSING_FACILITY): Payer: Medicare HMO | Admitting: Adult Health

## 2019-11-05 ENCOUNTER — Telehealth: Payer: Self-pay | Admitting: Critical Care Medicine

## 2019-11-05 ENCOUNTER — Other Ambulatory Visit: Payer: Self-pay | Admitting: Critical Care Medicine

## 2019-11-05 DIAGNOSIS — D6869 Other thrombophilia: Secondary | ICD-10-CM | POA: Diagnosis not present

## 2019-11-05 DIAGNOSIS — U071 COVID-19: Secondary | ICD-10-CM

## 2019-11-05 DIAGNOSIS — I10 Essential (primary) hypertension: Secondary | ICD-10-CM

## 2019-11-05 DIAGNOSIS — N1832 Chronic kidney disease, stage 3b: Secondary | ICD-10-CM

## 2019-11-05 DIAGNOSIS — IMO0002 Reserved for concepts with insufficient information to code with codable children: Secondary | ICD-10-CM

## 2019-11-05 DIAGNOSIS — E1121 Type 2 diabetes mellitus with diabetic nephropathy: Secondary | ICD-10-CM

## 2019-11-05 LAB — CBC
HCT: 32.4 % — ABNORMAL LOW (ref 39.0–52.0)
Hemoglobin: 10 g/dL — ABNORMAL LOW (ref 13.0–17.0)
MCH: 30.3 pg (ref 26.0–34.0)
MCHC: 30.9 g/dL (ref 30.0–36.0)
MCV: 98.2 fL (ref 80.0–100.0)
Platelets: 235 10*3/uL (ref 150–400)
RBC: 3.3 MIL/uL — ABNORMAL LOW (ref 4.22–5.81)
RDW: 14.7 % (ref 11.5–15.5)
WBC: 6.5 10*3/uL (ref 4.0–10.5)
nRBC: 0 % (ref 0.0–0.2)

## 2019-11-05 LAB — D-DIMER, QUANTITATIVE: D-Dimer, Quant: 15.48 ug/mL-FEU — ABNORMAL HIGH (ref 0.00–0.50)

## 2019-11-05 NOTE — Telephone Encounter (Signed)
I connected with this patient through nurse practitioner Ok Edwards.  The patient is a resident of long-term care facility [facility].  He would qualify for monoclonal antibody infusion and this would reduce his risk for emergency room and hospitalization.  Discussed with patient about Covid symptoms and the use of bamlanivimab, a monoclonal antibody infusion for those with mild to moderate Covid symptoms and at a high risk of hospitalization.  Pt is qualified for this infusion at the [facility] infusion center due to Age > 65, Diabetes, Hypertension and Chronic kidney disease which were addressed with the patient and are actively being managed by a [facility] provider.    After discussing the infusion's costs, potential benefits and side effects, the patient has decided to accept treatment with monoclonal antibodies.

## 2019-11-05 NOTE — Progress Notes (Signed)
I connected by phone with Samuel Ryan on 11/05/2019 at 1:49 PM to discuss the potential use of an new treatment for mild to moderate COVID-19 viral infection in non-hospitalized patients. This patient is in a long-term care facility and use a monoclonal antibody reduce this patient's risk for admission or emergency room utilization This patient is a 85 y.o. male that meets the FDA criteria for Emergency Use Authorization of bamlanivimab or casirivimab\imdevimab.  Has a (+) direct SARS-CoV-2 viral test result  Has mild or moderate COVID-19   Is ? 85 years of age and weighs ? 40 kg  Is NOT hospitalized due to COVID-19  Is NOT requiring oxygen therapy or requiring an increase in baseline oxygen flow rate due to COVID-19  Is within 10 days of symptom onset  Has at least one of the high risk factor(s) for progression to severe COVID-19 and/or hospitalization as defined in EUA.  Specific high risk criteria : Chronic Kidney Disease (CKD), hypertension, diabetes type 2  Patient Active Problem List   Diagnosis Date Noted  . Lab test positive for detection of COVID-19 virus 11/04/2019  . Failure to thrive in adult 08/15/2019  . Acute renal failure superimposed on stage 3 chronic kidney disease ([place]) 08/11/2019  . Oral thrush 08/04/2019  . Physical deconditioning 08/03/2019  . Hypertension associated with stage 3 chronic kidney disease due to type 2 diabetes mellitus ([place]) 07/16/2019  . Dyslipidemia associated with type 2 diabetes mellitus ([place]) 07/16/2019  . GERD without esophagitis 07/16/2019  . CKD stage 3 due to type 2 diabetes mellitus ([place]) 07/16/2019  . Major depression, recurrent, chronic ([place]) 07/16/2019  . Falls 07/08/2019  . Encounter for annual wellness exam in Medicare patient 11/28/2018  . Knee pain, left 10/10/2018  . History of total knee arthroplasty, right 10/10/2018  . Unilateral primary osteoarthritis, left knee 10/10/2018  . Paroxysmal SVT (supraventricular  tachycardia) ([place]) 09/25/2018  . Uncontrolled type 2 diabetes mellitus with hyperglycemia ([place]) 09/22/2018  . Dementia without behavioral disturbance ([employer]) 09/22/2018  . Morbid obesity ([place]) 06/12/2018  . Chronic anemia 09/14/2017  . CKD (chronic kidney disease) stage 3, GFR 30-59 ml/min 09/14/2017  . Atrial fibrillation ([place]) 09/14/2017  . Hypoalbuminemia 11/23/2016  . Hypoxia 10/29/2016  . Urinary frequency 09/17/2015  . Generalized osteoarthritis 07/16/2014  . Urinary incontinence 07/16/2014  . Pulmonary interstitial fibrosis ([place]) 07/03/2014  . Thyroid mass of unclear etiology 05/07/2014  . Vitamin D deficiency 01/31/2014  . Chronic non-seasonal allergic rhinitis 01/31/2014  . Diverticulosis of sigmoid colon 12/13/2012  . Alzheimer's dementia without behavioral disturbance ([place]) 09/12/2010  . Goiter 06/19/2009  . Essential hypertension 12/11/2008  . Uncontrolled diabetes mellitus with diabetic nephropathy, with long-term current use of insulin ([place]) 09/05/2008  . Hyperlipidemia with target LDL less than 100 07/19/2008  . BPH (benign prostatic hyperplasia) 03/28/2008    I have spoken and communicated the following to the patient or parent/caregiver:  1. FDA has authorized the emergency use of bamlanivimab for the treatment of mild to moderate COVID-19 in adults and pediatric patients with positive results of direct SARS-CoV-2 viral testing who are 61 years of age and older weighing at least 40 kg, and who are at high risk for progressing to severe COVID-19 and/or hospitalization.  2. The significant known and potential risks and benefits of bamlanivimab, and the extent to which such potential risks and benefits are unknown.  3. Information on available alternative treatments and the risks and benefits of those alternatives, including clinical trials.  4.  Patients treated with bamlanivimab should continue to self-isolate and use infection control measures (e.g., wear mask, isolate,  social distance, avoid sharing personal items, clean and disinfect "high touch" surfaces, and frequent handwashing) according to CDC guidelines.   5. The patient or parent/caregiver has the option to accept or refuse bamlanivimab.  After reviewing this information with the patient, The patient agreed to proceed with receiving the infusion of bamlanivimab and will be provided a copy of the Fact sheet prior to receiving the infusion.Samuel Ryan 11/05/2019 1:49 PM

## 2019-11-05 NOTE — Progress Notes (Signed)
Location:    [place] Room Number: 148/W Place of Service:  SNF (31)   CODE STATUS: DNR  Allergies  Allergen Reactions  . Zosyn [Piperacillin Sod-Tazobactam So] Anaphylaxis, Swelling and Other (See Comments)    Facial swelling Has patient had a PCN reaction causing immediate rash, facial/tongue/throat swelling, SOB or lightheadedness with hypotension: Yes Has patient had a PCN reaction causing severe rash involving mucus membranes or skin necrosis: No Has patient had a PCN reaction that required hospitalization: No Has patient had a PCN reaction occurring within the last 10 years: Yes If all of the above answers are "NO", then may proceed with Cephalosporin use.   . Ace Inhibitors Cough  . Vancomycin Swelling    Facial swelling    Chief Complaint  Patient presents with  . Acute Visit    Covid-19    HPI:  He is positive for covid 19. He is on eliquis for elevated d-dimer. He is a good candidate for monoclonical antiboidies. I have discussed the pros and cons of this therapy with this therapy. He is in agreement with this therapy. He denies any cough shortness of breath or body aches.   Past Medical History:  Diagnosis Date  . Acute cholecystitis 11/2012   Drained percutaneously  . Anxiety   . BPH (benign prostatic hypertrophy)   . Deep venous thrombosis (HCC)    Left leg, diagnosed 7/12  . Dementia [facility])    Needs supervision for safety per son - POA  . Diverticulitis   . Essential hypertension   . GERD (gastroesophageal reflux disease)   . History of pneumonia    Prior to 2012  . Incontinence of urine   . Mixed hyperlipidemia   . Osteoarthritis   . Pulmonary embolism ([place])    Diagnosed 7/12  . Type 2 diabetes mellitus ([place])     Past Surgical History:  Procedure Laterality Date  . CARPAL TUNNEL RELEASE    . COLONOSCOPY  08/31/2012   Procedure: COLONOSCOPY;  Surgeon: Rogene Houston, MD;  [facility];  Service: Endoscopy;   Laterality: N/A;  830  . CYSTOSCOPY WITH INJECTION N/A 05/15/2018   Procedure: CYSTOSCOPY WITH BOTOX INJECTION;  Surgeon: Cleon Gustin, MD;  Location: [facility] ORS;  Service: Urology;  Laterality: N/A;  . CYSTOSCOPY WITH INJECTION N/A 11/10/2018   Procedure: CYSTOSCOPY WITH BOTOX  INJECTION;  Surgeon: Cleon Gustin, MD;  Location: [facility] ORS;  Service: Urology;  Laterality: N/A;  30 MINS  . CYSTOSCOPY WITH INSERTION OF UROLIFT N/A 09/02/2016   Procedure: CYSTOSCOPY WITH INSERTION OF UROLIFT;  Surgeon: Cleon Gustin, MD;  Location: [facility];  Service: Urology;  Laterality: N/A;  . EYE SURGERY Bilateral    bilateral cataract  . Right total knee replacement  04/23/2011  . SPINE SURGERY  prior to 2012   Neck fusion  . UMBILICAL HERNIA REPAIR     umbilical    Social History   Socioeconomic History  . Marital status: Married    Spouse name: Not on file  . Number of children: Not on file  . Years of education: Not on file  . Highest education level: Not on file  Occupational History  . Occupation: retired   Scientific laboratory technician  . Financial resource strain: Not hard at all  . Food insecurity    Worry: Never true    Inability: Never true  . Transportation needs    Medical: No    Non-medical: No  Tobacco Use  . Smoking status: Never Smoker  . Smokeless tobacco: Never Used  Substance and Sexual Activity  . Alcohol use: No  . Drug use: No  . Sexual activity: Not Currently  Lifestyle  . Physical activity    Days per week: 0 days    Minutes per session: 0 min  . Stress: Not at all  Relationships  . Social Herbalist on phone: Once a week    Gets together: Never    Attends religious service: Never    Active member of club or organization: No    Attends meetings of clubs or organizations: Not on file    Relationship status: Married  . Intimate partner violence    Fear of current or ex partner: No    Emotionally abused: No    Physically abused: No     Forced sexual activity: No  Other Topics Concern  . Not on file  Social History Narrative   Long term resident of SNF    Family History  Problem Relation Age of Onset  . Cancer Mother        Stomach  . Diabetes Sister   . Hypertension Sister   . Diabetes Brother   . Cancer Brother        Gallbladder  . Seizures Son        due to meningitis as a child      VITAL SIGNS BP 106/70   Pulse 97   Temp 98.6 F (37 C) (Oral)   Resp 16   Ht 5\' 10"  (1.778 m)   Wt 220 lb 8 oz (100 kg)   SpO2 94%   BMI 31.64 kg/m   Facility-Administered Encounter Medications as of 11/05/2019  Medication  . botulinum toxin Type A (BOTOX) injection 100 Units   Outpatient Encounter Medications as of 11/05/2019  Medication Sig  . acetaminophen (TYLENOL) 500 MG tablet Take 1,000 mg by mouth every 6 (six) hours as needed for mild pain, moderate pain or headache.   . albuterol (VENTOLIN HFA) 108 (90 Base) MCG/ACT inhaler Inhale 2 puffs into the lungs every 4 (four) hours as needed for wheezing or shortness of breath.  [name] albuterol (VENTOLIN HFA) 108 (90 Base) MCG/ACT inhaler Inhale 4 puffs into the lungs once as needed for wheezing or shortness of breath.  [name] apixaban (ELIQUIS) 5 MG TABS tablet Take 5 mg by mouth 2 (two) times daily.  . Ascorbic Acid 500 MG CAPS Take 1,000 mg by mouth daily.  [name] atenolol (TENORMIN) 25 MG tablet Take 25 mg by mouth daily.  Roseanne Kaufman Peru-Castor Oil (VENELEX) OINT Apply 1 application topically 3 (three) times daily. Apply to entire buttocks  . [START ON 11/07/2019] Cholecalciferol 1.25 MG (50000 UT) TABS Take 50,000 Units by mouth. Once- one time  . diphenhydrAMINE HCl 50 MG/50ML LIQD Take by mouth. amt: 50mg ; intravenous  Special Instructions: As needed for adverse response during Monoclonal antibody therapy infusion and stop Monoclonal infusion. Once - One Time - PRN PRN 1  . donepezil (ARICEPT) 10 MG tablet TAKE 1 TABLET AT BEDTIME  . famotidine 20 MG/2ML SOLN Inject into  the vein. amt: 20mg  total; intravenous  Special Instructions: As needed for adverse response during Monoclonal antibody therapy infusion and stop Monoclonal infusion. Once - One Time - PRN PRN 1  . Glucerna (GLUCERNA) LIQD Take 237 mLs by mouth daily. Due to poor meal intake  . Insulin Glargine (LANTUS SOLOSTAR) 100 UNIT/ML Solostar Pen Inject 14  Units into the skin every morning. 8:00 am   . insulin lispro (HUMALOG KWIKPEN) 100 UNIT/ML KwikPen Inject 8 Units into the skin 3 (three) times daily with meals.  . Lidocaine 4 % PTCH Instructions: apply 1/2 patch to both knees in the AM and remove at HS for bilateral knee arthritic pain Once A Day  . linagliptin (TRADJENTA) 5 MG TABS tablet Take 5 mg by mouth daily.  [name] loratadine (CLARITIN) 10 MG tablet Take 10 mg by mouth every morning.   . magnesium hydroxide (MILK OF MAGNESIA) 400 MG/5ML suspension Take 30 mLs by mouth daily as needed for mild constipation. For constipation  . [START ON 11/06/2019] methylPREDNISolone sodium succinate (SOLU-MEDROL) 125 mg/2 mL injection Inject 125 mg into the vein once.  . NON FORMULARY Diet Type Change: Dysphagia 2, thin liquids (moisten meats with gravy) will allow banana cut into pieces upon request  . omeprazole (PRILOSEC) 40 MG capsule TAKE 1 CAPSULE TWICE DAILY  . [START ON 11/06/2019] ondansetron (ZOFRAN) 4 MG/2ML SOLN injection Inject 4 mg into the vein once as needed for nausea or vomiting.  . OXYGEN Inhale 2 L/min into the lungs continuous. May give oxygen at 2 L/min via nasal cannula, titrating to keep O2 saturation over 90%.  Inform physician ASAP for acute onset dyspnea or O2 saturation < 88%  . sertraline (ZOLOFT) 50 MG tablet Take 1 tablet (50 mg total) by mouth daily.  . tamsulosin (FLOMAX) 0.4 MG CAPS capsule TAKE 1 CAPSULE EVERY DAY  . zinc sulfate 220 (50 Zn) MG capsule Take 220 mg by mouth daily.     SIGNIFICANT DIAGNOSTIC EXAMS   PREVIOUS;   07-08-19: ct of head an cervical spine:  1. No  evidence of significant acute traumatic injury to the skull, brain or cervical spine. 2. Mild cerebral atrophy with extensive chronic microvascular ischemic changes in the cerebral white matter, as above. 3. Status post ACDF at C4-C6 with multilevel degenerative disc disease and cervical spondylosis, as above  07-08-19: chest x-ray:  1. No signs of significant acute traumatic injury to the thorax. 2. The appearance the chest is unusual. The possibility of congestive heart failure is considered, however, clinical correlation for signs and symptoms of severe acute bronchitis is recommended. 3. Aortic atherosclerosis.   07-08-19: bilateral hip x-ray: No acute osseous abnormality.   07-08-19; lumbar spine x-ray: Spondylosis, without acute osseous abnormality. Aortic Atherosclerosis   07-11-19: chest x-ray: Cardiomegaly with mild vascular congestion. No focal consolidation.    08-08-19: chest x-ray: No active cardiopulmonary disease.  NO NEW EXAMS.   LABS REVIEWED PREVIOUS;   04-24-19: hgb a1c 10.0 chol 130; ldl 76; tirg 115; hdl 34; vit D 51 07-08-19: wbc 5.8; hgb 11.3; hct 35.8; mcv 100.0; plt 192; glucose 123; bun 15; creat 1.58; k+ 5.1; na++ 140; ca 8.6 liver normal albumin 3.2; urine culture: no growth 07-10-19: wbc 5.7; hgb 12.6; hct 40.2; mcv 98.8 plt 194; glucose 146; bun 16; creat 1.41; k+ 4.3; na++ 140 ca 8.7 07-11-19: tsh 0.400 ammonia 14 07/30/2019: wbc 4.6; hgb 10.8; hct 34.3; mvc 100.6 plt 191; glucose 137; bun 17; creat 1.52; k+ 4.1; na+ 139; ca 8.1  08-08-19: wbc 7.3; hgb 10.4; hct 33.1; mcv 100.3; plt 227; glucose 292; bun 43; creat 2.27; k+ 5.2; na++ 134; ca 8.4  11-01-19: wbc 6.2; hgb 10.5; hct 34.;3 mcv 99.4 plt 267; glucose 125; bun 30; creat 1.59; k+ 4.9; na++ 137; ca 8.8; d-dimer >20.00 ferritin 1121.0   TODAY;   11-05-19:  wbc 6.5; hgb 10.0; hct 32.4; mcv 98.2 plt 235; d-dimer: 15.48  Review of Systems  Constitutional: Negative for malaise/fatigue.  Respiratory: Negative for cough and  shortness of breath.   Cardiovascular: Negative for chest pain, palpitations and leg swelling.  Gastrointestinal: Negative for abdominal pain, constipation and heartburn.  Musculoskeletal: Negative for back pain, joint pain and myalgias.  Skin: Negative.   Neurological: Negative for dizziness.  Psychiatric/Behavioral: The patient is not nervous/anxious.     Physical Exam Constitutional:      General: He is not in acute distress.    Appearance: He is well-developed. He is not diaphoretic.  Neck:     Thyroid: No thyromegaly.  Cardiovascular:     Rate and Rhythm: Normal rate and regular rhythm.     Pulses: Normal pulses.     Heart sounds: Normal heart sounds.  Pulmonary:     Effort: Pulmonary effort is normal. No respiratory distress.     Breath sounds: Normal breath sounds.     Comments: 02 dependent  Abdominal:     General: Bowel sounds are normal. There is no distension.     Palpations: Abdomen is soft.     Tenderness: There is no abdominal tenderness.  Musculoskeletal:     Cervical back: Neck supple.     Right lower leg: No edema.     Left lower leg: No edema.     Comments: Chronic 1- 2+ bilateral lower extremity edema Is able to move all extremities  History of right total knee replacement and neck fusion    Is unable to stand at this time      Lymphadenopathy:     Cervical: No cervical adenopathy.  Skin:    General: Skin is warm and dry.  Neurological:     Mental Status: He is alert. Mental status is at baseline.  Psychiatric:        Mood and Affect: Mood normal.       ASSESSMENT/ PLAN:  TODAY  ;  1. covid 19 2. hypercoagulable state due to covid 19   Will continue eliquis due to elevated d-dimer 15.48 Will setup for monoclonal antibodies and will monitor his status.     MD is aware of resident's narcotic use and is in agreement with current plan of care. We will attempt to wean resident as appropriate.  Ok Edwards NP [facility]   Contact [phone] Monday through Friday 8am- 5pm  After hours call [phone]

## 2019-11-06 ENCOUNTER — Ambulatory Visit (HOSPITAL_COMMUNITY): Payer: Medicare HMO | Attending: Critical Care Medicine

## 2019-11-06 MED ORDER — SODIUM CHLORIDE 0.9 % IV SOLN
INTRAVENOUS | Status: DC | PRN
  Filled 2019-11-06: qty 1000

## 2019-11-06 MED ORDER — SODIUM CHLORIDE 0.9 % IV SOLN
125.0000 mg | Freq: Once | INTRAVENOUS | Status: DC | PRN
  Filled 2019-11-06: qty 1.04

## 2019-11-06 MED ORDER — EPINEPHRINE 0.3 MG/0.3ML IJ SOAJ
0.3000 mg | Freq: Once | INTRAMUSCULAR | Status: DC | PRN
  Filled 2019-11-06: qty 0.6

## 2019-11-06 MED ORDER — SODIUM CHLORIDE 0.9 % IV SOLN
700.0000 mg | Freq: Once | INTRAVENOUS | Status: DC
  Filled 2019-11-06: qty 20

## 2019-11-06 MED ORDER — SODIUM CHLORIDE 0.9 % IV SOLN
20.0000 mg | Freq: Once | INTRAVENOUS | Status: DC | PRN
  Filled 2019-11-06: qty 2

## 2019-11-06 MED ORDER — DIPHENHYDRAMINE HCL 50 MG/ML IJ SOLN
50.0000 mg | Freq: Once | INTRAMUSCULAR | Status: DC | PRN
  Filled 2019-11-06: qty 1

## 2019-11-06 MED ORDER — ALBUTEROL SULFATE HFA 108 (90 BASE) MCG/ACT IN AERS
2.0000 | INHALATION_SPRAY | Freq: Once | RESPIRATORY_TRACT | Status: DC | PRN
  Filled 2019-11-06: qty 6.7

## 2019-11-07 ENCOUNTER — Encounter: Payer: Self-pay | Admitting: Adult Health

## 2019-11-07 ENCOUNTER — Non-Acute Institutional Stay (SKILLED_NURSING_FACILITY): Payer: Medicare HMO | Admitting: Adult Health

## 2019-11-07 DIAGNOSIS — D6869 Other thrombophilia: Secondary | ICD-10-CM | POA: Insufficient documentation

## 2019-11-07 DIAGNOSIS — U071 COVID-19: Secondary | ICD-10-CM | POA: Diagnosis not present

## 2019-11-07 DIAGNOSIS — J189 Pneumonia, unspecified organism: Secondary | ICD-10-CM | POA: Diagnosis not present

## 2019-11-07 DIAGNOSIS — J1282 Pneumonia due to coronavirus disease 2019: Secondary | ICD-10-CM

## 2019-11-07 DIAGNOSIS — J1289 Other viral pneumonia: Secondary | ICD-10-CM

## 2019-11-07 NOTE — Progress Notes (Signed)
Location:  [place] Room Number: 148-W Place of Service:  SNF (31)   CODE STATUS: DNR  Allergies  Allergen Reactions  . Zosyn [Piperacillin Sod-Tazobactam So] Anaphylaxis, Swelling and Other (See Comments)    Facial swelling Has patient had a PCN reaction causing immediate rash, facial/tongue/throat swelling, SOB or lightheadedness with hypotension: Yes Has patient had a PCN reaction causing severe rash involving mucus membranes or skin necrosis: No Has patient had a PCN reaction that required hospitalization: No Has patient had a PCN reaction occurring within the last 10 years: Yes If all of the above answers are "NO", then may proceed with Cephalosporin use.   . Ace Inhibitors Cough  . Vancomycin Swelling    Facial swelling    Chief Complaint  Patient presents with  . Acute Visit    Patient seen for positive COVID swab    HPI:  He has tested positive for covid. He is on eliquis for his elevated d-dimer. He has increased need for 02 at 5 liters today to maintain his 02 sats. He does have a cough present and appears to be more short of breath. There are no reports of fevers.   Past Medical History:  Diagnosis Date  . Acute cholecystitis 11/2012   Drained percutaneously  . Anxiety   . BPH (benign prostatic hypertrophy)   . Deep venous thrombosis (HCC)    Left leg, diagnosed 7/12  . Dementia [facility])    Needs supervision for safety per son - POA  . Diverticulitis   . Essential hypertension   . GERD (gastroesophageal reflux disease)   . History of pneumonia    Prior to 2012  . Incontinence of urine   . Mixed hyperlipidemia   . Osteoarthritis   . Pulmonary embolism ([place])    Diagnosed 7/12  . Type 2 diabetes mellitus ([place])     Past Surgical History:  Procedure Laterality Date  . CARPAL TUNNEL RELEASE    . COLONOSCOPY  08/31/2012   Procedure: COLONOSCOPY;  Surgeon: Rogene Houston, MD;  [facility];  Service: Endoscopy;   Laterality: N/A;  830  . CYSTOSCOPY WITH INJECTION N/A 05/15/2018   Procedure: CYSTOSCOPY WITH BOTOX INJECTION;  Surgeon: Cleon Gustin, MD;  Location: [facility];  Service: Urology;  Laterality: N/A;  . CYSTOSCOPY WITH INJECTION N/A 11/10/2018   Procedure: CYSTOSCOPY WITH BOTOX  INJECTION;  Surgeon: Cleon Gustin, MD;  Location: [facility];  Service: Urology;  Laterality: N/A;  30 MINS  . CYSTOSCOPY WITH INSERTION OF UROLIFT N/A 09/02/2016   Procedure: CYSTOSCOPY WITH INSERTION OF UROLIFT;  Surgeon: Cleon Gustin, MD;  [facility]  Service: Urology;  Laterality: N/A;  . EYE SURGERY Bilateral    bilateral cataract  . Right total knee replacement  04/23/2011  . SPINE SURGERY  prior to 2012   Neck fusion  . UMBILICAL HERNIA REPAIR     umbilical    Social History   Socioeconomic History  . Marital status: Married    Spouse name: Not on file  . Number of children: Not on file  . Years of education: Not on file  . Highest education level: Not on file  Occupational History  . Occupation: retired   Scientific laboratory technician  . Financial resource strain: Not hard at all  . Food insecurity    Worry: Never true    Inability: Never true  . Transportation needs    Medical: No    Non-medical:  No  Tobacco Use  . Smoking status: Never Smoker  . Smokeless tobacco: Never Used  Substance and Sexual Activity  . Alcohol use: No  . Drug use: No  . Sexual activity: Not Currently  Lifestyle  . Physical activity    Days per week: 0 days    Minutes per session: 0 min  . Stress: Not at all  Relationships  . Social Herbalist on phone: Once a week    Gets together: Never    Attends religious service: Never    Active member of club or organization: No    Attends meetings of clubs or organizations: Not on file    Relationship status: Married  . Intimate partner violence    Fear of current or ex partner: No    Emotionally abused: No    Physically abused: No     Forced sexual activity: No  Other Topics Concern  . Not on file  Social History Narrative   Long term resident of SNF    Family History  Problem Relation Age of Onset  . Cancer Mother        Stomach  . Diabetes Sister   . Hypertension Sister   . Diabetes Brother   . Cancer Brother        Gallbladder  . Seizures Son        due to meningitis as a child      VITAL SIGNS BP 102/65   Pulse 71   Temp (!) 97.1 F (36.2 C) (Oral)   Resp 18   Ht 5\' 10"  (1.778 m)   Wt 220 lb 8 oz (100 kg)   SpO2 (!) 74%   BMI 31.64 kg/m   Facility-Administered Encounter Medications as of 11/07/2019  Medication  . 0.9 %  sodium chloride infusion  . albuterol (VENTOLIN HFA) 108 (90 Base) MCG/ACT inhaler 2 puff  . bamlanivimab (EUA) 700 mg in sodium chloride 0.9 % 200 mL IVPB  . botulinum toxin Type A (BOTOX) injection 100 Units  . diphenhydrAMINE (BENADRYL) injection 50 mg  . EPINEPHrine (EPI-PEN) injection 0.3 mg  . famotidine (PEPCID) 20 mg in sodium chloride 0.9 % 50 mL IVPB  . methylPREDNISolone sodium succinate (SOLU-MEDROL) 130 mg in sodium chloride 0.9 % 50 mL IVPB   Outpatient Encounter Medications as of 11/07/2019  Medication Sig  . acetaminophen (TYLENOL) 500 MG tablet Take 1,000 mg by mouth every 6 (six) hours as needed for mild pain, moderate pain or headache.   . albuterol (VENTOLIN HFA) 108 (90 Base) MCG/ACT inhaler Inhale 2 puffs into the lungs every 4 (four) hours as needed for wheezing or shortness of breath.  [name] apixaban (ELIQUIS) 5 MG TABS tablet Take 5 mg by mouth 2 (two) times daily.  . Ascorbic Acid 500 MG CAPS Take 1,000 mg by mouth daily.  [name] atenolol (TENORMIN) 25 MG tablet Take 25 mg by mouth daily.  Roseanne Kaufman Peru-Castor Oil (VENELEX) OINT Apply 1 application topically 3 (three) times daily. Apply to entire buttocks  . dexamethasone (DECADRON) 6 MG tablet Take 6 mg by mouth daily.  [name] donepezil (ARICEPT) 10 MG tablet TAKE 1 TABLET AT BEDTIME  . Glucerna (GLUCERNA) LIQD  Take 237 mLs by mouth daily. Due to poor meal intake  . Insulin Glargine (LANTUS SOLOSTAR) 100 UNIT/ML Solostar Pen Inject 14 Units into the skin every morning. 8:00 am   . insulin lispro (HUMALOG KWIKPEN) 100 UNIT/ML KwikPen Inject 8 Units into the skin 3 (three)  times daily with meals.  . Lidocaine 4 % PTCH Instructions: apply 1/2 patch to both knees in the AM and remove at HS for bilateral knee arthritic pain Once A Day  . linagliptin (TRADJENTA) 5 MG TABS tablet Take 5 mg by mouth daily.  [name] loratadine (CLARITIN) 10 MG tablet Take 10 mg by mouth every morning.   . magnesium hydroxide (MILK OF MAGNESIA) 400 MG/5ML suspension Take 30 mLs by mouth daily as needed for mild constipation. For constipation  . NON FORMULARY Diet Type Change: Dysphagia 2, thin liquids (moisten meats with gravy) will allow banana cut into pieces upon request  . omeprazole (PRILOSEC) 40 MG capsule TAKE 1 CAPSULE TWICE DAILY  . OXYGEN Inhale 2 L/min into the lungs continuous. May give oxygen at 2 L/min via nasal cannula, titrating to keep O2 saturation over 90%.  Inform physician ASAP for acute onset dyspnea or O2 saturation < 88%  . sertraline (ZOLOFT) 50 MG tablet Take 1 tablet (50 mg total) by mouth daily.  . tamsulosin (FLOMAX) 0.4 MG CAPS capsule TAKE 1 CAPSULE EVERY DAY  . zinc sulfate 220 (50 Zn) MG capsule Take 220 mg by mouth daily.  . [DISCONTINUED] albuterol (VENTOLIN HFA) 108 (90 Base) MCG/ACT inhaler Inhale 4 puffs into the lungs once as needed for wheezing or shortness of breath.  . [DISCONTINUED] Cholecalciferol 1.25 MG (50000 UT) TABS Take 50,000 Units by mouth. Once- one time  . [DISCONTINUED] diphenhydrAMINE HCl 50 MG/50ML LIQD Take by mouth. amt: 50mg ; intravenous  Special Instructions: As needed for adverse response during Monoclonal antibody therapy infusion and stop Monoclonal infusion. Once - One Time - PRN PRN 1  . [DISCONTINUED] famotidine 20 MG/2ML SOLN Inject into the vein. amt: 20mg  total;  intravenous  Special Instructions: As needed for adverse response during Monoclonal antibody therapy infusion and stop Monoclonal infusion. Once - One Time - PRN PRN 1     SIGNIFICANT DIAGNOSTIC EXAMS   PREVIOUS;   07-08-19: ct of head an cervical spine:  1. No evidence of significant acute traumatic injury to the skull, brain or cervical spine. 2. Mild cerebral atrophy with extensive chronic microvascular ischemic changes in the cerebral white matter, as above. 3. Status post ACDF at C4-C6 with multilevel degenerative disc disease and cervical spondylosis, as above  07-08-19: chest x-ray:  1. No signs of significant acute traumatic injury to the thorax. 2. The appearance the chest is unusual. The possibility of congestive heart failure is considered, however, clinical correlation for signs and symptoms of severe acute bronchitis is recommended. 3. Aortic atherosclerosis.   07-08-19: bilateral hip x-ray: No acute osseous abnormality.   07-08-19; lumbar spine x-ray: Spondylosis, without acute osseous abnormality. Aortic Atherosclerosis   07-11-19: chest x-ray: Cardiomegaly with mild vascular congestion. No focal consolidation.    08-08-19: chest x-ray: No active cardiopulmonary disease.  NO NEW EXAMS.   LABS REVIEWED PREVIOUS;   04-24-19: hgb a1c 10.0 chol 130; ldl 76; tirg 115; hdl 34; vit D 51 07-08-19: wbc 5.8; hgb 11.3; hct 35.8; mcv 100.0; plt 192; glucose 123; bun 15; creat 1.58; k+ 5.1; na++ 140; ca 8.6 liver normal albumin 3.2; urine culture: no growth 07-10-19: wbc 5.7; hgb 12.6; hct 40.2; mcv 98.8 plt 194; glucose 146; bun 16; creat 1.41; k+ 4.3; na++ 140 ca 8.7 07-11-19: tsh 0.400 ammonia 14 07/22/2019: wbc 4.6; hgb 10.8; hct 34.3; mvc 100.6 plt 191; glucose 137; bun 17; creat 1.52; k+ 4.1; na+ 139; ca 8.1  08-08-19: wbc 7.3; hgb 10.4;  hct 33.1; mcv 100.3; plt 227; glucose 292; bun 43; creat 2.27; k+ 5.2; na++ 134; ca 8.4  11-01-19: wbc 6.2; hgb 10.5; hct 34.;3 mcv 99.4 plt 267; glucose  125; bun 30; creat 1.59; k+ 4.9; na++ 137; ca 8.8; d-dimer >20.00 ferritin 1121.0  11-05-19: wbc 6.5; hgb 10.0; hct 32.4; mcv 98.2 plt 235; d-dimer: 15.48  NO NEW LABS.   Review of Systems  Unable to perform ROS: Medical condition      Physical Exam Constitutional:      General: He is not in acute distress.    Appearance: He is well-developed. He is not diaphoretic.  Neck:     Thyroid: No thyromegaly.  Cardiovascular:     Rate and Rhythm: Normal rate and regular rhythm.     Pulses: Normal pulses.     Heart sounds: Normal heart sounds.  Pulmonary:     Effort: No respiratory distress.     Breath sounds: Wheezing present.     Comments: 02 at 5 liters Increased respiratory effort  Abdominal:     General: Bowel sounds are normal. There is no distension.     Palpations: Abdomen is soft.     Tenderness: There is no abdominal tenderness.  Musculoskeletal:     Cervical back: Neck supple.     Right lower leg: Edema present.     Left lower leg: Edema present.     Comments: Chronic 1- 2+ bilateral lower extremity edema Is able to move all extremities  History of right total knee replacement and neck fusion    Is unable to stand at this time       Lymphadenopathy:     Cervical: No cervical adenopathy.  Skin:    General: Skin is warm and dry.  Neurological:     Mental Status: He is alert. Mental status is at baseline.  Psychiatric:        Mood and Affect: Mood normal.       ASSESSMENT/ PLAN:  TODAY  1. Pneumonia due to covid 19 2. Hypercoagulable state due to covid 19 3. covid 19 virus infection  Will continue eliquis Will begin decadron 6 mg daily for 10 days Will begin levaquin 500 mg one time then 250 mg daily through 11-14-19.   MD is aware of resident's narcotic use and is in agreement with current plan of care. We will attempt to wean resident as appropriate.  Ok Edwards NP [facility]  Contact [phone] Monday through Friday 8am- 5pm  After  hours call [phone]

## 2019-11-08 ENCOUNTER — Encounter (HOSPITAL_COMMUNITY)
Admission: RE | Admit: 2019-11-08 | Discharge: 2019-11-08 | Disposition: A | Discharge status: Home / Self Care | Payer: Medicare HMO | Source: Skilled Nursing Facility

## 2019-11-08 DIAGNOSIS — U071 COVID-19: Secondary | ICD-10-CM | POA: Diagnosis not present

## 2019-11-08 LAB — D-DIMER, QUANTITATIVE: D-Dimer, Quant: 14.88 ug/mL-FEU — ABNORMAL HIGH (ref 0.00–0.50)

## 2019-11-13 ENCOUNTER — Other Ambulatory Visit (HOSPITAL_COMMUNITY)
Admission: RE | Admit: 2019-11-13 | Discharge: 2019-11-13 | Disposition: A | Discharge status: Home / Self Care | Payer: Medicare HMO | Source: Skilled Nursing Facility | Attending: Adult Health | Admitting: Adult Health

## 2019-11-13 DIAGNOSIS — U071 COVID-19: Secondary | ICD-10-CM | POA: Insufficient documentation

## 2019-11-13 LAB — D-DIMER, QUANTITATIVE: D-Dimer, Quant: 7.55 ug/mL-FEU — ABNORMAL HIGH (ref 0.00–0.50)

## 2019-11-14 DIAGNOSIS — U071 COVID-19: Secondary | ICD-10-CM | POA: Insufficient documentation

## 2019-11-15 ENCOUNTER — Other Ambulatory Visit (HOSPITAL_COMMUNITY)
Admission: RE | Admit: 2019-11-15 | Discharge: 2019-11-15 | Disposition: A | Discharge status: Home / Self Care | Payer: Medicare HMO | Source: Skilled Nursing Facility | Attending: Adult Health | Admitting: Adult Health

## 2019-11-15 ENCOUNTER — Encounter: Payer: Self-pay | Admitting: Adult Health

## 2019-11-15 ENCOUNTER — Non-Acute Institutional Stay (SKILLED_NURSING_FACILITY): Payer: Medicare HMO | Admitting: Adult Health

## 2019-11-15 DIAGNOSIS — N179 Acute kidney failure, unspecified: Secondary | ICD-10-CM | POA: Diagnosis not present

## 2019-11-15 DIAGNOSIS — U071 COVID-19: Secondary | ICD-10-CM | POA: Diagnosis not present

## 2019-11-15 DIAGNOSIS — J8410 Pulmonary fibrosis, unspecified: Secondary | ICD-10-CM

## 2019-11-15 DIAGNOSIS — D6869 Other thrombophilia: Secondary | ICD-10-CM | POA: Diagnosis not present

## 2019-11-15 DIAGNOSIS — N1832 Chronic kidney disease, stage 3b: Secondary | ICD-10-CM

## 2019-11-15 LAB — BASIC METABOLIC PANEL
Anion gap: 10 (ref 5–15)
BUN: 65 mg/dL — ABNORMAL HIGH (ref 8–23)
CO2: 19 mmol/L — ABNORMAL LOW (ref 22–32)
Calcium: 8.3 mg/dL — ABNORMAL LOW (ref 8.9–10.3)
Chloride: 108 mmol/L (ref 98–111)
Creatinine, Ser: 2.02 mg/dL — ABNORMAL HIGH (ref 0.61–1.24)
GFR calc Af Amer: 34 mL/min — ABNORMAL LOW (ref >60)
GFR calc non Af Amer: 29 mL/min — ABNORMAL LOW (ref >60)
Glucose, Bld: 343 mg/dL — ABNORMAL HIGH (ref 70–99)
Potassium: 4.9 mmol/L (ref 3.5–5.1)
Sodium: 137 mmol/L (ref 135–145)

## 2019-11-15 NOTE — Progress Notes (Signed)
Location:    [place] Room Number: 148/W Place of Service:  SNF (31)   CODE STATUS: DNR  Allergies  Allergen Reactions  . Zosyn [Piperacillin Sod-Tazobactam So] Anaphylaxis, Swelling and Other (See Comments)    Facial swelling Has patient had a PCN reaction causing immediate rash, facial/tongue/throat swelling, SOB or lightheadedness with hypotension: Yes Has patient had a PCN reaction causing severe rash involving mucus membranes or skin necrosis: No Has patient had a PCN reaction that required hospitalization: No Has patient had a PCN reaction occurring within the last 10 years: Yes If all of the above answers are "NO", then may proceed with Cephalosporin use.   . Ace Inhibitors Cough  . Vancomycin Swelling    Facial swelling    Chief Complaint  Patient presents with  . Acute Visit    COVID    HPI:  He continues to decline. His po intake is very poor. His 02 needs have not changed. There are no reports of fevers. There are no indications of pain present. He is being treated with decdron  Past Medical History:  Diagnosis Date  . Acute cholecystitis 11/2012   Drained percutaneously  . Anxiety   . BPH (benign prostatic hypertrophy)   . Deep venous thrombosis (HCC)    Left leg, diagnosed 7/12  . Dementia [facility])    Needs supervision for safety per son - POA  . Diverticulitis   . Essential hypertension   . GERD (gastroesophageal reflux disease)   . History of pneumonia    Prior to 2012  . Incontinence of urine   . Mixed hyperlipidemia   . Osteoarthritis   . Pulmonary embolism ([place])    Diagnosed 7/12  . Type 2 diabetes mellitus ([place])     Past Surgical History:  Procedure Laterality Date  . CARPAL TUNNEL RELEASE    . COLONOSCOPY  08/31/2012   Procedure: COLONOSCOPY;  Surgeon: Rogene Houston, MD;  Location: [facility] ENDO SU[facility];  Service: Endoscopy;  Laterality: N/A;  830  . CYSTOSCOPY WITH INJECTION N/A 05/15/2018   Procedure: CYSTOSCOPY WITH  BOTOX INJECTION;  Surgeon: Cleon Gustin, MD;  Location: [facility] ORS;  Service: Urology;  Laterality: N/A;  . CYSTOSCOPY WITH INJECTION N/A 11/10/2018   Procedure: CYSTOSCOPY WITH BOTOX  INJECTION;  Surgeon: Cleon Gustin, MD;  Location: [facility] ORS;  Service: Urology;  Laterality: N/A;  30 MINS  . CYSTOSCOPY WITH INSERTION OF UROLIFT N/A 09/02/2016   Procedure: CYSTOSCOPY WITH INSERTION OF UROLIFT;  Surgeon: Cleon Gustin, MD;  [facility]  Service: Urology;  Laterality: N/A;  . EYE SURGERY Bilateral    bilateral cataract  . Right total knee replacement  04/23/2011  . SPINE SURGERY  prior to 2012   Neck fusion  . UMBILICAL HERNIA REPAIR     umbilical    Social History   Socioeconomic History  . Marital status: Married    Spouse name: Not on file  . Number of children: Not on file  . Years of education: Not on file  . Highest education level: Not on file  Occupational History  . Occupation: retired   Tobacco Use  . Smoking status: Never Smoker  . Smokeless tobacco: Never Used  Substance and Sexual Activity  . Alcohol use: No  . Drug use: No  . Sexual activity: Not Currently  Other Topics Concern  . Not on file  Social History Narrative   Long term resident of SNF  Social Determinants of Health   Financial Resource Strain: Low Risk   . Difficulty of Paying Living Expenses: Not hard at all  Food Insecurity: No Food Insecurity  . Worried About Charity fundraiser in the Last Year: Never true  . Ran Out of Food in the Last Year: Never true  Transportation Needs: No Transportation Needs  . Lack of Transportation (Medical): No  . Lack of Transportation (Non-Medical): No  Physical Activity: Inactive  . Days of Exercise per Week: 0 days  . Minutes of Exercise per Session: 0 min  Stress: No Stress Concern Present  . Feeling of Stress : Not at all  Social Connections: Moderately Isolated  . Frequency of Communication with Friends and Family:  Once a week  . Frequency of Social Gatherings with Friends and Family: Never  . Attends Religious Services: Never  . Active Member of Clubs or Organizations: No  . Attends Archivist Meetings: Not asked  . Marital Status: Married  Human resources officer Violence: Not At Risk  . Fear of Current or Ex-Partner: No  . Emotionally Abused: No  . Physically Abused: No  . Sexually Abused: No   Family History  Problem Relation Age of Onset  . Cancer Mother        Stomach  . Diabetes Sister   . Hypertension Sister   . Diabetes Brother   . Cancer Brother        Gallbladder  . Seizures Son        due to meningitis as a child      VITAL SIGNS BP 140/84   Pulse 76   Temp 98.1 F (36.7 C) (Oral)   Resp 20   Ht 5\' 10"  (1.778 m)   Wt 220 lb 8 oz (100 kg)   SpO2 93%   BMI 31.64 kg/m   Facility-Administered Encounter Medications as of 11/15/2019  Medication  . 0.9 %  sodium chloride infusion  . albuterol (VENTOLIN HFA) 108 (90 Base) MCG/ACT inhaler 2 puff  . bamlanivimab (EUA) 700 mg in sodium chloride 0.9 % 200 mL IVPB  . botulinum toxin Type A (BOTOX) injection 100 Units  . diphenhydrAMINE (BENADRYL) injection 50 mg  . EPINEPHrine (EPI-PEN) injection 0.3 mg  . famotidine (PEPCID) 20 mg in sodium chloride 0.9 % 50 mL IVPB  . methylPREDNISolone sodium succinate (SOLU-MEDROL) 130 mg in sodium chloride 0.9 % 50 mL IVPB   Outpatient Encounter Medications as of 11/15/2019  Medication Sig  . acetaminophen (TYLENOL) 500 MG tablet Take 1,000 mg by mouth every 6 (six) hours as needed for mild pain, moderate pain or headache.   . albuterol (VENTOLIN HFA) 108 (90 Base) MCG/ACT inhaler Inhale 2 puffs into the lungs every 4 (four) hours as needed for wheezing or shortness of breath.  [name] apixaban (ELIQUIS) 5 MG TABS tablet Take 5 mg by mouth 2 (two) times daily.  [name] atenolol (TENORMIN) 25 MG tablet Take 25 mg by mouth daily.  Roseanne Kaufman Peru-Castor Oil (VENELEX) OINT Apply 1 application  topically 3 (three) times daily. Apply to entire buttocks  . dexamethasone (DECADRON) 6 MG tablet Take 6 mg by mouth daily.  [name] donepezil (ARICEPT) 10 MG tablet TAKE 1 TABLET AT BEDTIME  . Glucerna (GLUCERNA) LIQD Take 237 mLs by mouth daily. Due to poor meal intake  . Insulin Glargine (LANTUS SOLOSTAR) 100 UNIT/ML Solostar Pen Inject 14 Units into the skin every morning. 8:00 am   . insulin lispro (HUMALOG KWIKPEN) 100 UNIT/ML KwikPen  Inject 8 Units into the skin 3 (three) times daily with meals.  . Lidocaine 4 % PTCH Instructions: apply 1/2 patch to both knees in the AM and remove at HS for bilateral knee arthritic pain Once A Day  . linagliptin (TRADJENTA) 5 MG TABS tablet Take 5 mg by mouth daily.  [name] loratadine (CLARITIN) 10 MG tablet Take 10 mg by mouth every morning.   . magnesium hydroxide (MILK OF MAGNESIA) 400 MG/5ML suspension Take 30 mLs by mouth daily as needed for mild constipation. For constipation  . NON FORMULARY Diet Type Change: Dysphagia 2, thin liquids (moisten meats with gravy) will allow banana cut into pieces upon request  . omeprazole (PRILOSEC) 40 MG capsule TAKE 1 CAPSULE TWICE DAILY  . OXYGEN Inhale 2 L/min into the lungs continuous. May give oxygen at 2 L/min via nasal cannula, titrating to keep O2 saturation over 90%.  Inform physician ASAP for acute onset dyspnea or O2 saturation < 88%  . sertraline (ZOLOFT) 50 MG tablet Take 1 tablet (50 mg total) by mouth daily.  . Sodium Chloride Flush (NORMAL SALINE FLUSH) 0.9 % SOLN injection; 0.9 % saline; amt: 50 cc per hour; intravenous  Special Instructions: due to acute renal failure due to covid 19 Every Shift Day, Evening, Night  . tamsulosin (FLOMAX) 0.4 MG CAPS capsule TAKE 1 CAPSULE EVERY DAY  . [DISCONTINUED] Ascorbic Acid 500 MG CAPS Take 1,000 mg by mouth daily.  . [DISCONTINUED] zinc sulfate 220 (50 Zn) MG capsule Take 220 mg by mouth daily.     SIGNIFICANT DIAGNOSTIC EXAMS   PREVIOUS;   07-08-19: ct of head  an cervical spine:  1. No evidence of significant acute traumatic injury to the skull, brain or cervical spine. 2. Mild cerebral atrophy with extensive chronic microvascular ischemic changes in the cerebral white matter, as above. 3. Status post ACDF at C4-C6 with multilevel degenerative disc disease and cervical spondylosis, as above  07-08-19: chest x-ray:  1. No signs of significant acute traumatic injury to the thorax. 2. The appearance the chest is unusual. The possibility of congestive heart failure is considered, however, clinical correlation for signs and symptoms of severe acute bronchitis is recommended. 3. Aortic atherosclerosis.   07-08-19: bilateral hip x-ray: No acute osseous abnormality.   07-08-19; lumbar spine x-ray: Spondylosis, without acute osseous abnormality. Aortic Atherosclerosis   07-11-19: chest x-ray: Cardiomegaly with mild vascular congestion. No focal consolidation.    08-08-19: chest x-ray: No active cardiopulmonary disease.  NO NEW EXAMS.   LABS REVIEWED PREVIOUS;   04-24-19: hgb a1c 10.0 chol 130; ldl 76; tirg 115; hdl 34; vit D 51 07-08-19: wbc 5.8; hgb 11.3; hct 35.8; mcv 100.0; plt 192; glucose 123; bun 15; creat 1.58; k+ 5.1; na++ 140; ca 8.6 liver normal albumin 3.2; urine culture: no growth 07-10-19: wbc 5.7; hgb 12.6; hct 40.2; mcv 98.8 plt 194; glucose 146; bun 16; creat 1.41; k+ 4.3; na++ 140 ca 8.7 07-11-19: tsh 0.400 ammonia 14 07/14/2019: wbc 4.6; hgb 10.8; hct 34.3; mvc 100.6 plt 191; glucose 137; bun 17; creat 1.52; k+ 4.1; na+ 139; ca 8.1  08-08-19: wbc 7.3; hgb 10.4; hct 33.1; mcv 100.3; plt 227; glucose 292; bun 43; creat 2.27; k+ 5.2; na++ 134; ca 8.4  11-01-19: wbc 6.2; hgb 10.5; hct 34.;3 mcv 99.4 plt 267; glucose 125; bun 30; creat 1.59; k+ 4.9; na++ 137; ca 8.8; d-dimer >20.00 ferritin 1121.0  11-05-19: wbc 6.5; hgb 10.0; hct 32.4; mcv 98.2 plt 235; d-dimer: 15.48  NO NEW LABS.   Review of Systems  Unable to perform ROS: Medical condition    Physical Exam Constitutional:      General: He is not in acute distress.    Appearance: He is well-developed. He is not diaphoretic.  HENT:     Mouth/Throat:     Mouth: Mucous membranes are dry.  Neck:     Thyroid: No thyromegaly.  Cardiovascular:     Rate and Rhythm: Normal rate and regular rhythm.     Pulses: Normal pulses.     Heart sounds: Normal heart sounds.  Pulmonary:     Effort: Pulmonary effort is normal. No respiratory distress.     Breath sounds: Normal breath sounds.  Abdominal:     General: Bowel sounds are normal. There is no distension.     Palpations: Abdomen is soft.     Tenderness: There is no abdominal tenderness.  Musculoskeletal:        General: Normal range of motion.     Cervical back: Neck supple.     Right lower leg: No edema.     Left lower leg: No edema.  Lymphadenopathy:     Cervical: No cervical adenopathy.  Skin:    General: Skin is warm and dry.     Comments: Poor turgor   Neurological:     Mental Status: He is alert.        ASSESSMENT/ PLAN:  TODAY  1. Pulmonary interstitial fibrosis 2. Acute on chronic stage 3 renal disease 3. hypercoagulable state due to covid 19 4. covid 19   Will begin NS at 50 ml per hour Will check bmp today and will repeat tin the AM   MD is aware of resident's narcotic use and is in agreement with current plan of care. We will attempt to wean resident as appropriate.  Ok Edwards NP [facility]  Contact [phone] Monday through Friday 8am- 5pm  After hours call [phone]

## 2019-11-15 NOTE — Progress Notes (Signed)
This encounter was created in error - please disregard.

## 2019-11-16 ENCOUNTER — Other Ambulatory Visit (HOSPITAL_COMMUNITY)
Admission: RE | Admit: 2019-11-16 | Discharge: 2019-11-16 | Disposition: A | Discharge status: Home / Self Care | Payer: Medicare HMO | Source: Skilled Nursing Facility | Attending: Adult Health | Admitting: Adult Health

## 2019-11-16 DIAGNOSIS — U071 COVID-19: Secondary | ICD-10-CM | POA: Insufficient documentation

## 2019-11-16 LAB — BASIC METABOLIC PANEL
Anion gap: 8 (ref 5–15)
BUN: 63 mg/dL — ABNORMAL HIGH (ref 8–23)
CO2: 23 mmol/L (ref 22–32)
Calcium: 8.9 mg/dL (ref 8.9–10.3)
Chloride: 116 mmol/L — ABNORMAL HIGH (ref 98–111)
Creatinine, Ser: 1.87 mg/dL — ABNORMAL HIGH (ref 0.61–1.24)
GFR calc Af Amer: 37 mL/min — ABNORMAL LOW (ref >60)
GFR calc non Af Amer: 32 mL/min — ABNORMAL LOW (ref >60)
Glucose, Bld: 224 mg/dL — ABNORMAL HIGH (ref 70–99)
Potassium: 4.9 mmol/L (ref 3.5–5.1)
Sodium: 147 mmol/L — ABNORMAL HIGH (ref 135–145)

## 2019-11-16 LAB — D-DIMER, QUANTITATIVE: D-Dimer, Quant: 9.14 ug/mL-FEU — ABNORMAL HIGH (ref 0.00–0.50)

## 2019-11-18 ENCOUNTER — Telehealth: Payer: Self-pay | Admitting: Nurse Practitioner

## 2019-11-18 NOTE — Telephone Encounter (Signed)
Late entry from on call 12/19, nurse Izora Gala called several times regarding pt. He had been previously made comfort care due to positive COVID and ongoing decline. Reports he had not really eaten or drank anything in about a week. In the AM of 12/19 had spiked a temp of 103.1 and she gave tylenol. Family was able to come visit however expressed concerns of discomfort and there were no PRN orders on file. Orders given for Roxanol 20mg /ml to give 0.25 ml - 5 mg q 4 hours as needed for signs of pain, increase RR/discomfort.  FYI sent to facility providers.

## 2019-11-19 ENCOUNTER — Telehealth: Payer: Self-pay | Admitting: Internal Medicine

## 2019-11-19 ENCOUNTER — Encounter (HOSPITAL_COMMUNITY)
Admission: RE | Admit: 2019-11-19 | Discharge: 2019-11-19 | Disposition: A | Discharge status: Home / Self Care | Payer: Medicare HMO | Source: Skilled Nursing Facility | Attending: Adult Health | Admitting: Adult Health

## 2019-11-19 ENCOUNTER — Non-Acute Institutional Stay (SKILLED_NURSING_FACILITY): Payer: Medicare HMO | Admitting: Adult Health

## 2019-11-19 ENCOUNTER — Other Ambulatory Visit: Payer: Self-pay | Admitting: Adult Health

## 2019-11-19 ENCOUNTER — Encounter: Payer: Self-pay | Admitting: Adult Health

## 2019-11-19 DIAGNOSIS — G301 Alzheimer's disease with late onset: Secondary | ICD-10-CM | POA: Diagnosis not present

## 2019-11-19 DIAGNOSIS — J1289 Other viral pneumonia: Secondary | ICD-10-CM

## 2019-11-19 DIAGNOSIS — U071 COVID-19: Secondary | ICD-10-CM

## 2019-11-19 DIAGNOSIS — F0280 Dementia in other diseases classified elsewhere without behavioral disturbance: Secondary | ICD-10-CM | POA: Diagnosis not present

## 2019-11-19 LAB — BASIC METABOLIC PANEL
Anion gap: 11 (ref 5–15)
BUN: 86 mg/dL — ABNORMAL HIGH (ref 8–23)
CO2: 18 mmol/L — ABNORMAL LOW (ref 22–32)
Calcium: 8.7 mg/dL — ABNORMAL LOW (ref 8.9–10.3)
Chloride: 124 mmol/L — ABNORMAL HIGH (ref 98–111)
Creatinine, Ser: 2.71 mg/dL — ABNORMAL HIGH (ref 0.61–1.24)
GFR calc Af Amer: 24 mL/min — ABNORMAL LOW (ref >60)
GFR calc non Af Amer: 20 mL/min — ABNORMAL LOW (ref >60)
Glucose, Bld: 585 mg/dL (ref 70–99)
Potassium: 5.4 mmol/L — ABNORMAL HIGH (ref 3.5–5.1)
Sodium: 153 mmol/L — ABNORMAL HIGH (ref 135–145)

## 2019-11-19 MED ORDER — MORPHINE SULFATE (CONCENTRATE) 20 MG/ML PO SOLN: 5.0000 mg | ORAL | 0 refills | Status: DC | PRN

## 2019-11-19 MED ORDER — MORPHINE SULFATE (CONCENTRATE) 20 MG/ML PO SOLN: 5.0000 mg | ORAL | 0 refills | Status: AC | PRN

## 2019-11-19 NOTE — Telephone Encounter (Signed)
Approved discontinuation of IVFs due to end of life COVID status

## 2019-11-19 NOTE — Progress Notes (Signed)
Location:    [place] Room Number: 148/W Place of Service:  SNF (31)   CODE STATUS: DNR  Allergies  Allergen Reactions  . Zosyn [Piperacillin Sod-Tazobactam So] Anaphylaxis, Swelling and Other (See Comments)    Facial swelling Has patient had a PCN reaction causing immediate rash, facial/tongue/throat swelling, SOB or lightheadedness with hypotension: Yes Has patient had a PCN reaction causing severe rash involving mucus membranes or skin necrosis: No Has patient had a PCN reaction that required hospitalization: No Has patient had a PCN reaction occurring within the last 10 years: Yes If all of the above answers are "NO", then may proceed with Cephalosporin use.   . Ace Inhibitors Cough  . Vancomycin Swelling    Facial swelling    Chief Complaint  Patient presents with  . Acute Visit    End of Life Visit    HPI:  His status continues to decline. His family has decided upon comfort measures for him. I have discussed with his family stopping his medications. They have agreed to stopping his medications. He is not eating or drinking at this time. There are no reports of fevers present.    Past Medical History:  Diagnosis Date  . Acute cholecystitis 11/2012   Drained percutaneously  . Anxiety   . BPH (benign prostatic hypertrophy)   . Deep venous thrombosis (HCC)    Left leg, diagnosed 7/12  . Dementia [facility])    Needs supervision for safety per son - POA  . Diverticulitis   . Essential hypertension   . GERD (gastroesophageal reflux disease)   . History of pneumonia    Prior to 2012  . Incontinence of urine   . Mixed hyperlipidemia   . Osteoarthritis   . Pulmonary embolism ([place])    Diagnosed 7/12  . Type 2 diabetes mellitus ([place])     Past Surgical History:  Procedure Laterality Date  . CARPAL TUNNEL RELEASE    . COLONOSCOPY  08/31/2012   Procedure: COLONOSCOPY;  Surgeon: Rogene Houston, MD;  [facility];  Service: Endoscopy;   Laterality: N/A;  830  . CYSTOSCOPY WITH INJECTION N/A 05/15/2018   Procedure: CYSTOSCOPY WITH BOTOX INJECTION;  Surgeon: Cleon Gustin, MD;  Location: [facility] ORS;  Service: Urology;  Laterality: N/A;  . CYSTOSCOPY WITH INJECTION N/A 11/10/2018   Procedure: CYSTOSCOPY WITH BOTOX  INJECTION;  Surgeon: Cleon Gustin, MD;  Location: [facility] ORS;  Service: Urology;  Laterality: N/A;  30 MINS  . CYSTOSCOPY WITH INSERTION OF UROLIFT N/A 09/02/2016   Procedure: CYSTOSCOPY WITH INSERTION OF UROLIFT;  Surgeon: Cleon Gustin, MD;  Location: [facility];  Service: Urology;  Laterality: N/A;  . EYE SURGERY Bilateral    bilateral cataract  . Right total knee replacement  04/23/2011  . SPINE SURGERY  prior to 2012   Neck fusion  . UMBILICAL HERNIA REPAIR     umbilical    Social History   Socioeconomic History  . Marital status: Married    Spouse name: Not on file  . Number of children: Not on file  . Years of education: Not on file  . Highest education level: Not on file  Occupational History  . Occupation: retired   Tobacco Use  . Smoking status: Never Smoker  . Smokeless tobacco: Never Used  Substance and Sexual Activity  . Alcohol use: No  . Drug use: No  . Sexual activity: Not Currently  Other Topics Concern  . Not  on file  Social History Narrative   Long term resident of SNF    Social Determinants of Health   Financial Resource Strain: Low Risk   . Difficulty of Paying Living Expenses: Not hard at all  Food Insecurity: No Food Insecurity  . Worried About Charity fundraiser in the Last Year: Never true  . Ran Out of Food in the Last Year: Never true  Transportation Needs: No Transportation Needs  . Lack of Transportation (Medical): No  . Lack of Transportation (Non-Medical): No  Physical Activity: Inactive  . Days of Exercise per Week: 0 days  . Minutes of Exercise per Session: 0 min  Stress: No Stress Concern Present  . Feeling of Stress : Not at all    Social Connections: Moderately Isolated  . Frequency of Communication with Friends and Family: Once a week  . Frequency of Social Gatherings with Friends and Family: Never  . Attends Religious Services: Never  . Active Member of Clubs or Organizations: No  . Attends Archivist Meetings: Not asked  . Marital Status: Married  Human resources officer Violence: Not At Risk  . Fear of Current or Ex-Partner: No  . Emotionally Abused: No  . Physically Abused: No  . Sexually Abused: No   Family History  Problem Relation Age of Onset  . Cancer Mother        Stomach  . Diabetes Sister   . Hypertension Sister   . Diabetes Brother   . Cancer Brother        Gallbladder  . Seizures Son        due to meningitis as a child      VITAL SIGNS BP 116/71   Pulse (!) 112   Temp 97.9 F (36.6 C) (Oral)   Resp (!) 24   Ht 5\' 10"  (1.778 m)   Wt 220 lb 8 oz (100 kg)   SpO2 91%   BMI 31.64 kg/m   Facility-Administered Encounter Medications as of 11/19/2019  Medication  . 0.9 %  sodium chloride infusion  . albuterol (VENTOLIN HFA) 108 (90 Base) MCG/ACT inhaler 2 puff  . bamlanivimab (EUA) 700 mg in sodium chloride 0.9 % 200 mL IVPB  . botulinum toxin Type A (BOTOX) injection 100 Units  . diphenhydrAMINE (BENADRYL) injection 50 mg  . EPINEPHrine (EPI-PEN) injection 0.3 mg  . famotidine (PEPCID) 20 mg in sodium chloride 0.9 % 50 mL IVPB  . methylPREDNISolone sodium succinate (SOLU-MEDROL) 130 mg in sodium chloride 0.9 % 50 mL IVPB   Outpatient Encounter Medications as of 11/19/2019  Medication Sig  . acetaminophen (TYLENOL) 500 MG tablet Take 1,000 mg by mouth every 6 (six) hours as needed for mild pain, moderate pain or headache.   . albuterol (VENTOLIN HFA) 108 (90 Base) MCG/ACT inhaler Inhale 2 puffs into the lungs every 4 (four) hours as needed for wheezing or shortness of breath.  [name] apixaban (ELIQUIS) 5 MG TABS tablet Take 5 mg by mouth 2 (two) times daily.  [name] atenolol (TENORMIN)  25 MG tablet Take 25 mg by mouth daily.  Roseanne Kaufman Peru-Castor Oil (VENELEX) OINT Apply 1 application topically 3 (three) times daily. Apply to entire buttocks  . donepezil (ARICEPT) 10 MG tablet TAKE 1 TABLET AT BEDTIME  . Glucerna (GLUCERNA) LIQD Take 237 mLs by mouth daily. Due to poor meal intake  . Insulin Glargine (LANTUS SOLOSTAR) 100 UNIT/ML Solostar Pen Inject 14 Units into the skin every morning. 8:00 am   . insulin  lispro (HUMALOG KWIKPEN) 100 UNIT/ML KwikPen Inject 8 Units into the skin 3 (three) times daily with meals.  . Lidocaine 4 % PTCH Instructions: apply 1/2 patch to both knees in the AM and remove at HS for bilateral knee arthritic pain Once A Day  . linagliptin (TRADJENTA) 5 MG TABS tablet Take 5 mg by mouth daily.  [name] loratadine (CLARITIN) 10 MG tablet Take 10 mg by mouth every morning.   . magnesium hydroxide (MILK OF MAGNESIA) 400 MG/5ML suspension Take 30 mLs by mouth daily as needed for mild constipation. For constipation  . morphine (ROXANOL) 20 MG/ML concentrated solution Take 0.25 ml to = 5 mg by mouth for pain,  increased respirations, SOB, labored breathing every 4 hours as needed  . NON FORMULARY Diet Type Change: Dysphagia 2, thin liquids (moisten meats with gravy) will allow banana cut into pieces upon request  . omeprazole (PRILOSEC) 40 MG capsule TAKE 1 CAPSULE TWICE DAILY  . OXYGEN Inhale 2 L/min into the lungs continuous. May give oxygen at 2 L/min via nasal cannula, titrating to keep O2 saturation over 90%.  Inform physician ASAP for acute onset dyspnea or O2 saturation < 88%  . sertraline (ZOLOFT) 50 MG tablet Take 1 tablet (50 mg total) by mouth daily.  . tamsulosin (FLOMAX) 0.4 MG CAPS capsule TAKE 1 CAPSULE EVERY DAY  . [DISCONTINUED] dexamethasone (DECADRON) 6 MG tablet Take 6 mg by mouth daily.  . [DISCONTINUED] Sodium Chloride Flush (NORMAL SALINE FLUSH) 0.9 % SOLN injection; 0.9 % saline; amt: 50 cc per hour; intravenous  Special Instructions: due to  acute renal failure due to covid 19 Every Shift Day, Evening, Night     SIGNIFICANT DIAGNOSTIC EXAMS   PREVIOUS;   07-08-19: ct of head an cervical spine:  1. No evidence of significant acute traumatic injury to the skull, brain or cervical spine. 2. Mild cerebral atrophy with extensive chronic microvascular ischemic changes in the cerebral white matter, as above. 3. Status post ACDF at C4-C6 with multilevel degenerative disc disease and cervical spondylosis, as above  07-08-19: chest x-ray:  1. No signs of significant acute traumatic injury to the thorax. 2. The appearance the chest is unusual. The possibility of congestive heart failure is considered, however, clinical correlation for signs and symptoms of severe acute bronchitis is recommended. 3. Aortic atherosclerosis.   07-08-19: bilateral hip x-ray: No acute osseous abnormality.   07-08-19; lumbar spine x-ray: Spondylosis, without acute osseous abnormality. Aortic Atherosclerosis   07-11-19: chest x-ray: Cardiomegaly with mild vascular congestion. No focal consolidation.    08-08-19: chest x-ray: No active cardiopulmonary disease.  NO NEW EXAMS.   LABS REVIEWED PREVIOUS;   04-24-19: hgb a1c 10.0 chol 130; ldl 76; tirg 115; hdl 34; vit D 51 07-08-19: wbc 5.8; hgb 11.3; hct 35.8; mcv 100.0; plt 192; glucose 123; bun 15; creat 1.58; k+ 5.1; na++ 140; ca 8.6 liver normal albumin 3.2; urine culture: no growth 07-10-19: wbc 5.7; hgb 12.6; hct 40.2; mcv 98.8 plt 194; glucose 146; bun 16; creat 1.41; k+ 4.3; na++ 140 ca 8.7 07-11-19: tsh 0.400 ammonia 14 07/25/2019: wbc 4.6; hgb 10.8; hct 34.3; mvc 100.6 plt 191; glucose 137; bun 17; creat 1.52; k+ 4.1; na+ 139; ca 8.1  08-08-19: wbc 7.3; hgb 10.4; hct 33.1; mcv 100.3; plt 227; glucose 292; bun 43; creat 2.27; k+ 5.2; na++ 134; ca 8.4  11-01-19: wbc 6.2; hgb 10.5; hct 34.;3 mcv 99.4 plt 267; glucose 125; bun 30; creat 1.59; k+ 4.9; na++ 137;  ca 8.8; d-dimer >20.00 ferritin 1121.0  11-05-19: wbc 6.5;  hgb 10.0; hct 32.4; mcv 98.2 plt 235; d-dimer: 15.48  NO NEW LABS.   Review of Systems  Unable to perform ROS: Medical condition   Physical Exam Constitutional:      General: He is not in acute distress.    Appearance: He is well-developed. He is not diaphoretic.  HENT:     Mouth/Throat:     Mouth: Mucous membranes are dry.  Neck:     Thyroid: No thyromegaly.  Cardiovascular:     Rate and Rhythm: Normal rate and regular rhythm.     Pulses: Normal pulses.     Heart sounds: Normal heart sounds.  Pulmonary:     Effort: Pulmonary effort is normal. No respiratory distress.     Breath sounds: Normal breath sounds.  Abdominal:     General: Bowel sounds are normal. There is no distension.     Palpations: Abdomen is soft.     Tenderness: There is no abdominal tenderness.  Musculoskeletal:     Cervical back: Neck supple.     Right lower leg: No edema.     Left lower leg: No edema.  Lymphadenopathy:     Cervical: No cervical adenopathy.  Skin:    General: Skin is warm and dry.  Neurological:     Comments: Is aware         ASSESSMENT/ PLAN:  TODAY  1. Late onset alzheimer's disease 2. covid 19 virus infection 3. Pneumonia due to covid 77   Will stop all medications except roxanol Will have tylenol supp available and dulcolax supp available Will continue to focus upon his comfort Will monitor his status    MD is aware of resident's narcotic use and is in agreement with current plan of care. We will attempt to wean resident as appropriate.  Ok Edwards NP [facility]  Contact [phone] Monday through Friday 8am- 5pm  After hours call [phone]

## 2019-11-30 DEATH — deceased

## 2021-06-09 IMAGING — CT CT HEAD WITHOUT CONTRAST
3 series · 15 of 47 positions shown, 18 images · non-contrast
Comparison: 11/21/2018

CLINICAL DATA: Atraumatic altered mental status

EXAM:
CT HEAD WITHOUT CONTRAST
TECHNIQUE: Contiguous axial images were obtained from the base of the skull
through the vertex without intravenous contrast.

[Series 2: head w o · axial · 0.45mm/px · z∈[-136,-6]mm · 9 of 32 slices shown, 12 images]
[im 3/32  brain]
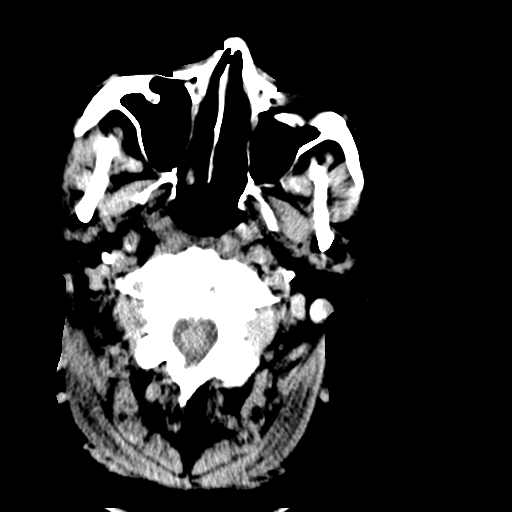
[im 3/32  bone]
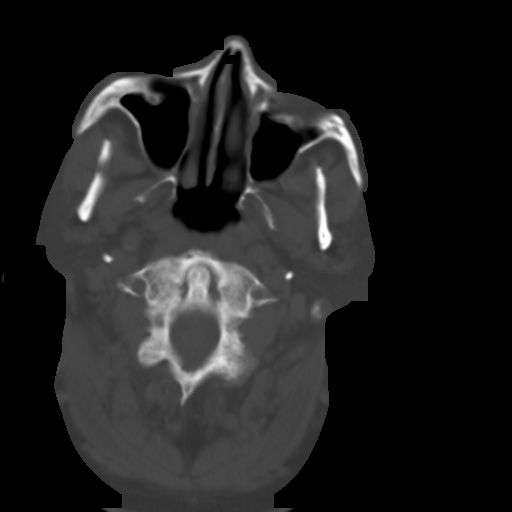
[im 6/32  brain]
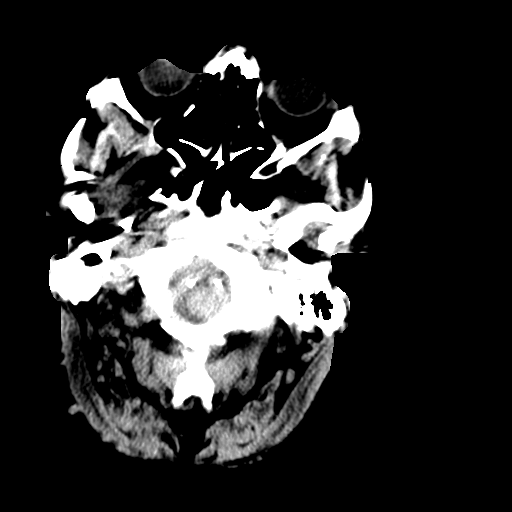
[im 9/32  brain]
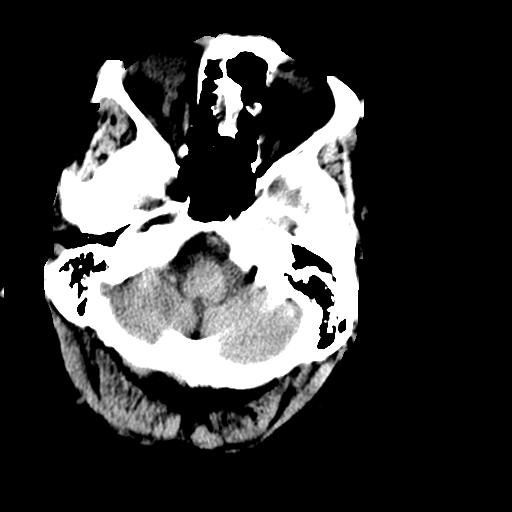
[im 12/32  brain]
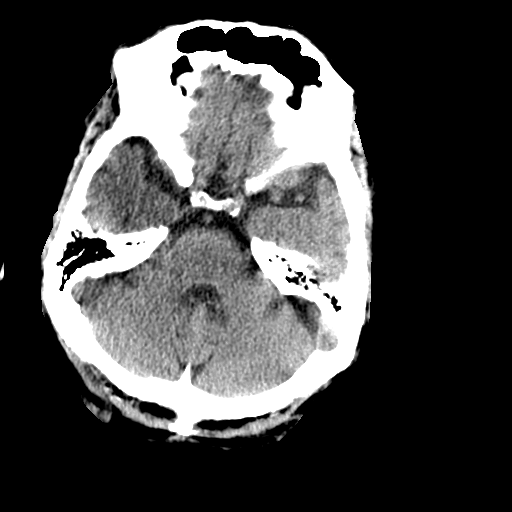
[im 17/32  brain]
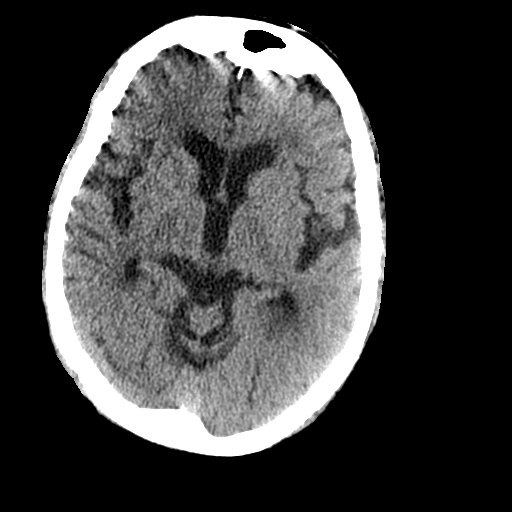
[im 17/32  bone]
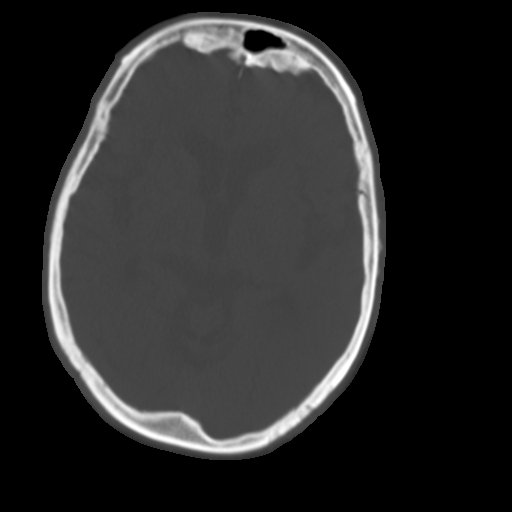
[im 20/32  brain]
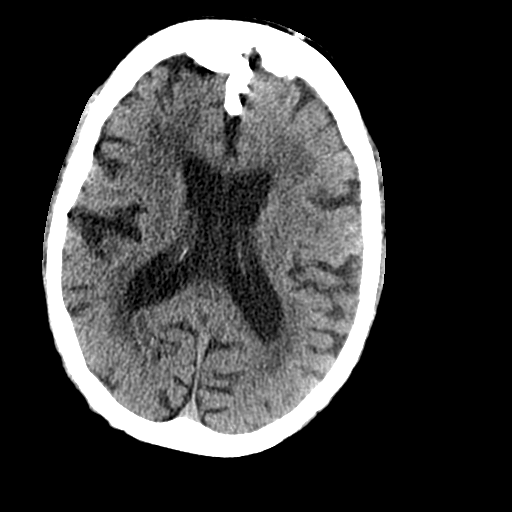
[im 23/32  brain]
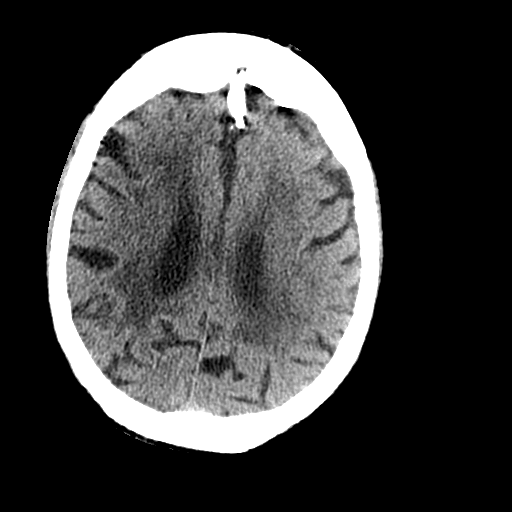
[im 26/32  brain]
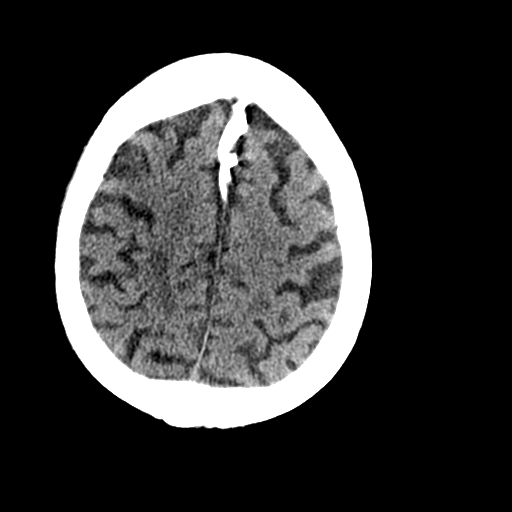
[im 29/32  brain]
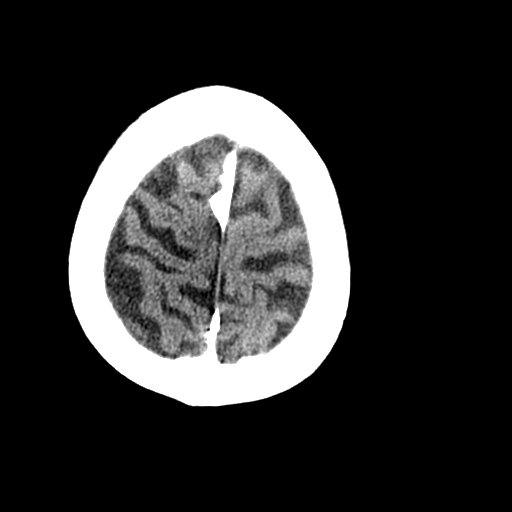
[im 29/32  bone]
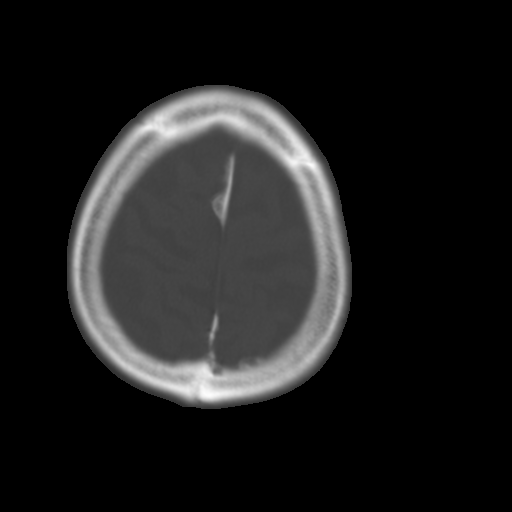

[Series 4: coronal soft · coronal · 0.35mm/px · 3 of 78 slices shown]
[im 26/78  brain]
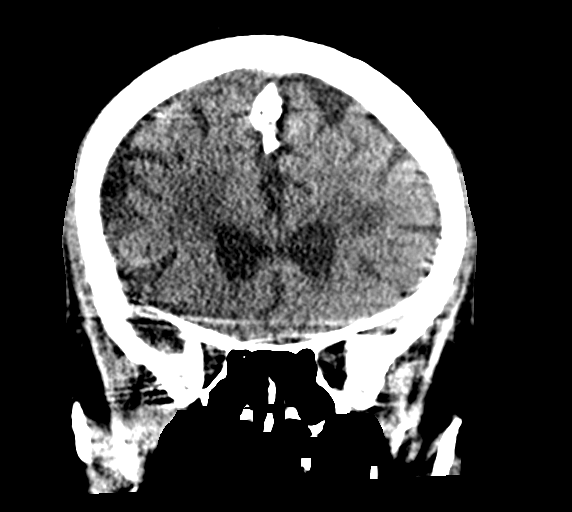
[im 35/78  brain]
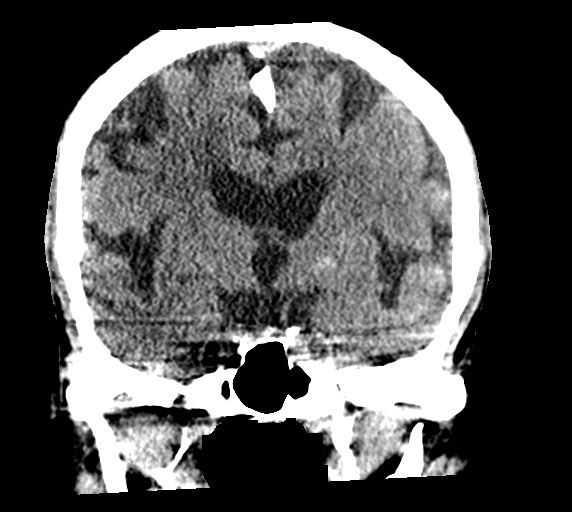
[im 43/78  brain]
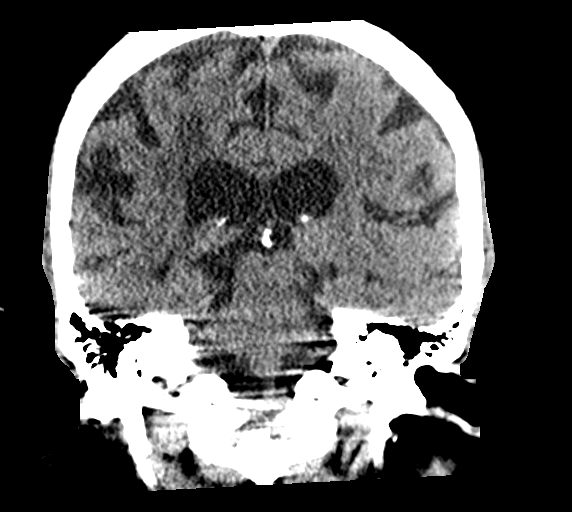

[Series 5: sagittal soft · sagittal · 0.36mm/px · 3 of 59 slices shown]
[im 20/59  brain]
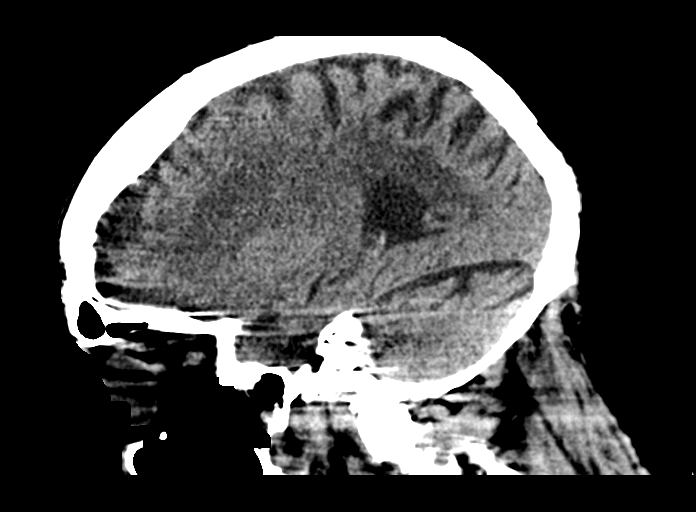
[im 30/59  brain]
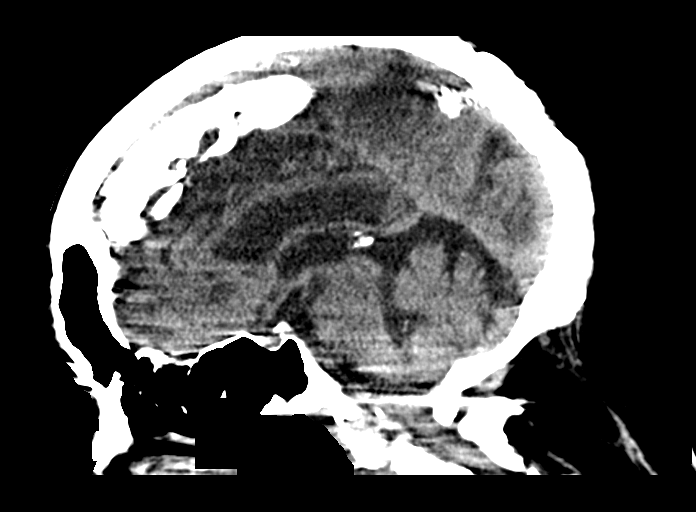
[im 39/59  brain]
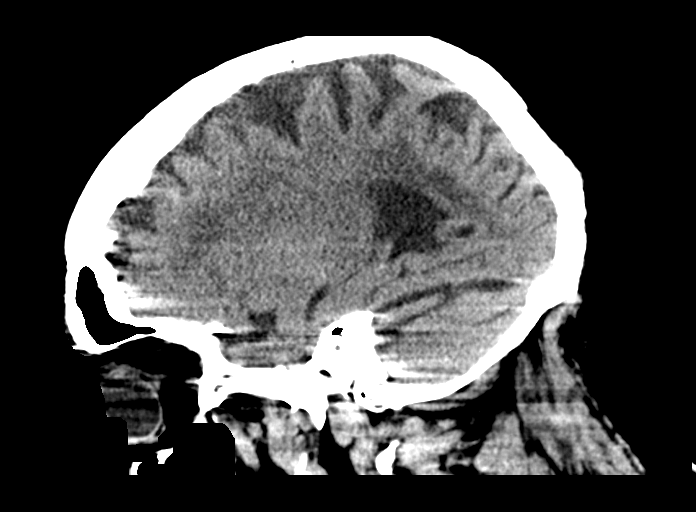

[15 of 47 positions shown; findings below may reference images not displayed]

FINDINGS: Brain: Limited by motion and streak artifact. Prominent generalized
atrophy and chronic small vessel ischemic low-density in the
cerebral white matter. No definite acute infarct. No hemorrhage,
hydrocephalus, or masslike finding

Vascular: Atherosclerotic calcification.  No hyperdense vessel

Skull: No acute or aggressive finding. TMJ and upper cervical
degenerative changes

Sinuses/Orbits: Bilateral cataract resection.  No acute finding
IMPRESSION: 1. Limited study without acute finding.
2. Atrophy and chronic small vessel ischemia.
